# Patient Record
Sex: Female | Born: 1935 | Race: Black or African American | Hispanic: No | Smoking: Former smoker
Health system: Southern US, Community
[De-identification: ages and names within clinical notes are randomized; demographics above are authoritative.]

## PROBLEM LIST (undated history)

## (undated) DIAGNOSIS — F1011 Alcohol abuse, in remission: Secondary | ICD-10-CM

## (undated) DIAGNOSIS — I4891 Unspecified atrial fibrillation: Secondary | ICD-10-CM

## (undated) DIAGNOSIS — C3491 Malignant neoplasm of unspecified part of right bronchus or lung: Secondary | ICD-10-CM

## (undated) DIAGNOSIS — I10 Essential (primary) hypertension: Secondary | ICD-10-CM

## (undated) DIAGNOSIS — Z8709 Personal history of other diseases of the respiratory system: Secondary | ICD-10-CM

## (undated) DIAGNOSIS — F32A Depression, unspecified: Secondary | ICD-10-CM

## (undated) DIAGNOSIS — R0600 Dyspnea, unspecified: Secondary | ICD-10-CM

## (undated) DIAGNOSIS — R918 Other nonspecific abnormal finding of lung field: Secondary | ICD-10-CM

## (undated) DIAGNOSIS — I499 Cardiac arrhythmia, unspecified: Secondary | ICD-10-CM

## (undated) DIAGNOSIS — F329 Major depressive disorder, single episode, unspecified: Secondary | ICD-10-CM

## (undated) DIAGNOSIS — M8580 Other specified disorders of bone density and structure, unspecified site: Secondary | ICD-10-CM

## (undated) DIAGNOSIS — F419 Anxiety disorder, unspecified: Secondary | ICD-10-CM

## (undated) HISTORY — DX: Other specified disorders of bone density and structure, unspecified site: M85.80

## (undated) HISTORY — DX: Major depressive disorder, single episode, unspecified: F32.9

## (undated) HISTORY — DX: Essential (primary) hypertension: I10

## (undated) HISTORY — DX: Alcohol abuse, in remission: F10.11

## (undated) HISTORY — DX: Unspecified atrial fibrillation: I48.91

## (undated) HISTORY — DX: Anxiety disorder, unspecified: F41.9

## (undated) HISTORY — DX: Depression, unspecified: F32.A

## (undated) HISTORY — DX: Other nonspecific abnormal finding of lung field: R91.8

## (undated) HISTORY — DX: Malignant neoplasm of unspecified part of right bronchus or lung: C34.91

---

## 2003-02-18 ENCOUNTER — Inpatient Hospital Stay (HOSPITAL_COMMUNITY): Admission: EM | Admit: 2003-02-18 | Discharge: 2003-02-21 | Payer: Self-pay | Admitting: Psychiatry

## 2004-01-23 ENCOUNTER — Ambulatory Visit: Payer: Self-pay | Admitting: Psychiatry

## 2004-02-12 ENCOUNTER — Ambulatory Visit: Payer: Self-pay | Admitting: Psychiatry

## 2004-08-25 ENCOUNTER — Ambulatory Visit: Payer: Self-pay | Admitting: Psychiatry

## 2004-08-25 ENCOUNTER — Inpatient Hospital Stay (HOSPITAL_COMMUNITY): Admission: RE | Admit: 2004-08-25 | Discharge: 2004-08-28 | Payer: Self-pay | Admitting: Psychiatry

## 2004-09-16 ENCOUNTER — Ambulatory Visit: Payer: Self-pay | Admitting: Psychiatry

## 2004-10-28 ENCOUNTER — Ambulatory Visit: Payer: Self-pay | Admitting: Psychiatry

## 2004-12-03 ENCOUNTER — Ambulatory Visit: Payer: Self-pay | Admitting: Psychiatry

## 2005-01-12 ENCOUNTER — Ambulatory Visit: Payer: Self-pay | Admitting: Psychiatry

## 2005-03-12 ENCOUNTER — Ambulatory Visit: Payer: Self-pay | Admitting: Psychiatry

## 2005-05-07 ENCOUNTER — Ambulatory Visit: Payer: Self-pay | Admitting: Psychiatry

## 2005-06-15 ENCOUNTER — Ambulatory Visit: Payer: Self-pay | Admitting: Psychiatry

## 2005-08-20 ENCOUNTER — Ambulatory Visit (HOSPITAL_COMMUNITY): Payer: Self-pay | Admitting: Psychiatry

## 2005-09-09 ENCOUNTER — Ambulatory Visit (HOSPITAL_COMMUNITY): Payer: Self-pay | Admitting: Psychiatry

## 2005-09-13 ENCOUNTER — Ambulatory Visit (HOSPITAL_COMMUNITY): Payer: Self-pay | Admitting: Psychiatry

## 2005-10-15 ENCOUNTER — Ambulatory Visit (HOSPITAL_COMMUNITY): Payer: Self-pay | Admitting: Psychiatry

## 2005-11-16 ENCOUNTER — Ambulatory Visit (HOSPITAL_COMMUNITY): Payer: Self-pay | Admitting: Psychiatry

## 2005-11-18 ENCOUNTER — Ambulatory Visit (HOSPITAL_COMMUNITY): Payer: Self-pay | Admitting: Psychiatry

## 2005-12-15 ENCOUNTER — Ambulatory Visit (HOSPITAL_COMMUNITY): Payer: Self-pay | Admitting: Psychiatry

## 2006-01-21 ENCOUNTER — Ambulatory Visit (HOSPITAL_COMMUNITY): Payer: Self-pay | Admitting: Psychiatry

## 2006-02-03 ENCOUNTER — Ambulatory Visit (HOSPITAL_COMMUNITY): Payer: Self-pay | Admitting: Psychiatry

## 2006-03-18 ENCOUNTER — Ambulatory Visit (HOSPITAL_COMMUNITY): Payer: Self-pay | Admitting: Psychiatry

## 2006-04-07 ENCOUNTER — Ambulatory Visit (HOSPITAL_COMMUNITY): Payer: Self-pay | Admitting: Psychiatry

## 2006-05-31 ENCOUNTER — Ambulatory Visit (HOSPITAL_COMMUNITY): Payer: Self-pay | Admitting: Psychiatry

## 2006-06-30 ENCOUNTER — Ambulatory Visit (HOSPITAL_COMMUNITY): Payer: Self-pay | Admitting: Psychiatry

## 2006-07-07 ENCOUNTER — Ambulatory Visit (HOSPITAL_COMMUNITY): Payer: Self-pay | Admitting: Psychiatry

## 2006-08-09 ENCOUNTER — Ambulatory Visit (HOSPITAL_COMMUNITY): Payer: Self-pay | Admitting: Psychiatry

## 2006-08-18 ENCOUNTER — Ambulatory Visit (HOSPITAL_COMMUNITY): Payer: Self-pay | Admitting: Psychiatry

## 2006-10-04 ENCOUNTER — Ambulatory Visit (HOSPITAL_COMMUNITY): Payer: Self-pay | Admitting: Psychiatry

## 2006-10-18 ENCOUNTER — Ambulatory Visit (HOSPITAL_COMMUNITY): Payer: Self-pay | Admitting: Psychiatry

## 2006-12-16 ENCOUNTER — Ambulatory Visit (HOSPITAL_COMMUNITY): Payer: Self-pay | Admitting: Psychiatry

## 2007-01-17 ENCOUNTER — Ambulatory Visit (HOSPITAL_COMMUNITY): Payer: Self-pay | Admitting: Psychiatry

## 2007-02-21 ENCOUNTER — Ambulatory Visit (HOSPITAL_COMMUNITY): Payer: Self-pay | Admitting: Psychiatry

## 2007-03-21 ENCOUNTER — Ambulatory Visit (HOSPITAL_COMMUNITY): Payer: Self-pay | Admitting: Psychiatry

## 2007-05-16 ENCOUNTER — Ambulatory Visit (HOSPITAL_COMMUNITY): Payer: Self-pay | Admitting: Psychiatry

## 2007-06-08 ENCOUNTER — Ambulatory Visit (HOSPITAL_COMMUNITY): Payer: Self-pay | Admitting: Psychiatry

## 2007-08-09 ENCOUNTER — Ambulatory Visit (HOSPITAL_COMMUNITY): Payer: Self-pay | Admitting: Psychiatry

## 2007-08-10 ENCOUNTER — Ambulatory Visit (HOSPITAL_COMMUNITY): Payer: Self-pay | Admitting: Psychiatry

## 2007-09-08 ENCOUNTER — Ambulatory Visit (HOSPITAL_COMMUNITY): Payer: Self-pay | Admitting: Psychiatry

## 2007-10-13 ENCOUNTER — Ambulatory Visit (HOSPITAL_COMMUNITY): Payer: Self-pay | Admitting: Psychiatry

## 2007-11-23 ENCOUNTER — Ambulatory Visit (HOSPITAL_COMMUNITY): Payer: Self-pay | Admitting: Psychiatry

## 2008-01-01 ENCOUNTER — Ambulatory Visit (HOSPITAL_COMMUNITY): Payer: Self-pay | Admitting: Psychiatry

## 2008-02-22 ENCOUNTER — Ambulatory Visit (HOSPITAL_COMMUNITY): Payer: Self-pay | Admitting: Psychiatry

## 2008-05-23 ENCOUNTER — Ambulatory Visit (HOSPITAL_COMMUNITY): Payer: Self-pay | Admitting: Psychiatry

## 2008-07-10 ENCOUNTER — Ambulatory Visit (HOSPITAL_COMMUNITY): Payer: Self-pay | Admitting: Psychiatry

## 2008-08-19 ENCOUNTER — Ambulatory Visit (HOSPITAL_COMMUNITY): Payer: Self-pay | Admitting: Psychiatry

## 2008-08-20 ENCOUNTER — Ambulatory Visit (HOSPITAL_COMMUNITY): Payer: Self-pay | Admitting: Psychiatry

## 2008-11-19 ENCOUNTER — Ambulatory Visit (HOSPITAL_COMMUNITY): Payer: Self-pay | Admitting: Psychiatry

## 2009-01-10 ENCOUNTER — Ambulatory Visit (HOSPITAL_COMMUNITY): Payer: Self-pay | Admitting: Psychiatry

## 2009-03-20 ENCOUNTER — Ambulatory Visit (HOSPITAL_COMMUNITY): Payer: Self-pay | Admitting: Psychiatry

## 2009-06-19 ENCOUNTER — Ambulatory Visit (HOSPITAL_COMMUNITY): Payer: Self-pay | Admitting: Psychiatry

## 2009-06-26 ENCOUNTER — Ambulatory Visit (HOSPITAL_COMMUNITY): Payer: Self-pay | Admitting: Psychiatry

## 2009-07-11 ENCOUNTER — Ambulatory Visit (HOSPITAL_COMMUNITY): Payer: Self-pay | Admitting: Psychiatry

## 2009-07-15 ENCOUNTER — Ambulatory Visit (HOSPITAL_COMMUNITY): Payer: Self-pay | Admitting: Psychiatry

## 2009-08-05 ENCOUNTER — Ambulatory Visit (HOSPITAL_COMMUNITY): Payer: Self-pay | Admitting: Psychiatry

## 2009-08-14 ENCOUNTER — Ambulatory Visit (HOSPITAL_COMMUNITY): Payer: Self-pay | Admitting: Psychiatry

## 2009-08-26 ENCOUNTER — Ambulatory Visit (HOSPITAL_COMMUNITY): Payer: Self-pay | Admitting: Psychiatry

## 2009-09-24 ENCOUNTER — Ambulatory Visit (HOSPITAL_COMMUNITY): Payer: Self-pay | Admitting: Psychiatry

## 2009-09-30 ENCOUNTER — Ambulatory Visit (HOSPITAL_COMMUNITY): Payer: Self-pay | Admitting: Psychiatry

## 2009-10-29 ENCOUNTER — Ambulatory Visit (HOSPITAL_COMMUNITY): Payer: Self-pay | Admitting: Psychiatry

## 2009-11-28 ENCOUNTER — Ambulatory Visit (HOSPITAL_COMMUNITY): Payer: Self-pay | Admitting: Psychiatry

## 2009-12-30 ENCOUNTER — Ambulatory Visit (HOSPITAL_COMMUNITY): Payer: Self-pay | Admitting: Psychiatry

## 2010-01-20 ENCOUNTER — Ambulatory Visit (HOSPITAL_COMMUNITY): Payer: Self-pay | Admitting: Psychiatry

## 2010-03-05 ENCOUNTER — Ambulatory Visit (HOSPITAL_COMMUNITY): Payer: Self-pay | Admitting: Psychiatry

## 2010-03-31 ENCOUNTER — Ambulatory Visit (HOSPITAL_COMMUNITY): Payer: Self-pay | Admitting: Psychiatry

## 2010-05-14 ENCOUNTER — Ambulatory Visit (HOSPITAL_COMMUNITY)
Admission: RE | Admit: 2010-05-14 | Discharge: 2010-05-14 | Payer: Self-pay | Source: Home / Self Care | Attending: Psychiatry | Admitting: Psychiatry

## 2010-06-18 ENCOUNTER — Encounter (HOSPITAL_COMMUNITY): Payer: Self-pay | Admitting: Psychiatry

## 2010-06-30 ENCOUNTER — Encounter (INDEPENDENT_AMBULATORY_CARE_PROVIDER_SITE_OTHER): Payer: Medicare Other | Admitting: Psychiatry

## 2010-06-30 DIAGNOSIS — F331 Major depressive disorder, recurrent, moderate: Secondary | ICD-10-CM

## 2010-07-22 ENCOUNTER — Encounter (INDEPENDENT_AMBULATORY_CARE_PROVIDER_SITE_OTHER): Payer: Medicare Other | Admitting: Psychiatry

## 2010-07-22 DIAGNOSIS — F411 Generalized anxiety disorder: Secondary | ICD-10-CM

## 2010-07-22 DIAGNOSIS — F331 Major depressive disorder, recurrent, moderate: Secondary | ICD-10-CM

## 2010-08-28 ENCOUNTER — Encounter (HOSPITAL_COMMUNITY): Payer: Medicare Other | Admitting: Psychiatry

## 2010-09-18 NOTE — Discharge Summary (Signed)
Alexandria Chen, Alexandria Chen                  ACCOUNT NO.:  [id number]   MEDICAL RECORD NO.:  [id number]          PATIENT TYPE:  IPS   LOCATION:  0502                          FACILITY:  [facility]   PHYSICIAN:  Geoffery Lyons, M.D.      DATE OF BIRTH:  12/27/1934   DATE OF ADMISSION:  08/25/2004  DATE OF DISCHARGE:  08/28/2004                                 DISCHARGE SUMMARY   CHIEF COMPLAINT AND PRESENT ILLNESS:  This was the second admission to [facility] for this 76 year old African-American female,  married, voluntarily admitted.  History of depression with alcohol abuse.  Relapsed on alcohol about six months prior to this admission.  She endorsed  when she started drinking she could not stop.  Drinking up to 4-6 tall beers  daily.  Had been feeling anxious and irritable when relapsed.  Grieving the  loss of the sister two weeks prior to this admission.  Endorsed sadness,  irritability, some anxiety, passive suicidal ideation.   PAST PSYCHIATRIC HISTORY:  Second time at [facility].  Last time in  October of 2004 for anxiety, depression and alcohol abuse.   ALCOHOL/DRUG HISTORY:  Alcohol for the last 20 years with some sobriety in  between.   MEDICAL HISTORY:  Arterial hypertension.   MEDICATIONS:  Norvasc 10 mg daily, Prozac 20 mg daily, folic acid 1 mg  daily.   PHYSICAL EXAMINATION:  Performed and failed to show any acute findings.   LABORATORY DATA:  Blood chemistries within normal limits.  SGOT 23, SGPT 19.  TSH 1.918.  Drug screen negative for substances of abuse.   MENTAL STATUS EXAM:  Alert, pleasant, cooperative female, spontaneous.  Speech normal rate, tempo and production.  Mood euthymic.  Affect broad.  Thought process logical, coherent and relevant.  Some passive suicidal  ideation.  No plan.  No homicidal ideation.  No delusions.  No  hallucinations.  Cognition was well-preserved.   ADMISSION DIAGNOSES:   AXIS I:  1.  Alcohol dependence.  2.   Depressive disorder not otherwise specified.   AXIS II:  No diagnosis.   AXIS III:  Arterial hypertension.   AXIS IV:  Moderate.   AXIS V:  Global Assessment of Functioning upon admission 35; highest Global  Assessment of Functioning in the last year 68.   HOSPITAL COURSE:  She was admitted and she was started in individual and  group psychotherapy.  She was given trazodone for sleep.  She was maintained  on Norvasc 10 mg per day and Prozac 20 mg per day.  She was detoxified with  Librium.  She endorsed that she has been having a hard time with alcohol but  actually minimizes her use.  Endorsed that she got depressed, she started  drinking and then cannot control it.  Her mood was anxious.  Affect was  broad.  She was still minimizing her alcohol use.  Claimed that she was  feeling better.  We pursued the detox.  There was a family session with the  husband and three daughters.  Concerns about her drinking.  Concerns about  drinking and driving.  Family attributed the relapse to the loss of the  sister.  Still minimizing her alcohol use.  She endorsed that she was ready  to go home.  She was wanting to be discharged after the session.  When she  was told that she was not going to be discharged, she became very upset and  tearful.  She was supported by the family.  On April 20th, she was in full  contact with reality.  No suicidal ideation.  No hallucinations.  No  homicidal ideation.  No evidence of active withdrawal.  Endorsed she was  feeling much better.  Committed to abstinence but at the same time still  minimizing.  Not a complete insight in terms of the severity of her alcohol  use but claiming that she was going to abstain.   DISCHARGE DIAGNOSES:   AXIS I:  1.  Alcohol abuse.  2.  Depressive disorder not otherwise specified.   AXIS II:  No diagnosis.   AXIS III:  Arterial hypertension.   AXIS IV:  Moderate.   AXIS V:  Global Assessment of Functioning upon discharge  50.   DISCHARGE MEDICATIONS:  1.  Norvasc 10 mg per day.  2.  Prozac 20 mg per day.  3.  Trazodone 50 mg at night.   FOLLOW UP:  Dr. Lolly Mustache and Arnold Long.       IL/MEDQ  D:  09/23/2004  T:  09/23/2004  Job:  [id number]

## 2010-09-18 NOTE — Discharge Summary (Signed)
NAME:  Alexandria Chen, Alexandria Chen                            ACCOUNT NO.:  [id number]   MEDICAL RECORD NO.:  [id number]                   PATIENT TYPE:  IPS   LOCATION:  0501                                 FACILITY:  [facility]   PHYSICIAN:  Geoffery Lyons, M.D.                   DATE OF BIRTH:  1934-10-09   DATE OF ADMISSION:  02/18/2003  DATE OF DISCHARGE:  02/21/2003                                 DISCHARGE SUMMARY   CHIEF COMPLAINT AND PRESENT ILLNESS:  This was the first admission to [facility] for this 76 year old African-American female,  married, voluntarily admitted.  Presented with a history of alcohol use, at  least for 20 years.  Never detoxed.  Previous sobriety not clear.  Drinking,  increased use of alcohol for the last two months.  No known stressors.  Married but gets very depressed.  Denies suicidal or homicidal ideation.  Drinking at least 3-4 beers per day.  Some sweating and anxiety as  withdrawal symptoms.   PAST PSYCHIATRIC HISTORY:  Behavioral Health in [place] for a month.  Has  been on Prozac for a month.  First time treated.   ALCOHOL/DRUG HISTORY:  Alcohol as already stated.  No significant complete  abstinence.   MEDICAL HISTORY:  Dyslipidemia, arterial hypertension.   MEDICATIONS:  BuSpar 15 mg twice a day, Prozac 20 mg daily, Lipitor 10 mg  daily, Norvasc 5 mg daily, Ativan 0.25 mg twice a day and Toprol XL 50 mg  daily.   PHYSICAL EXAMINATION:  Performed and failed to show any acute findings.   MENTAL STATUS EXAM:  Fully alert, in no acute distress, pleasant, sociable.  Speech normal tempo, production, rate.  Some constriction in her affect.  Mood euthymic.  Thought process logical and coherent.  No homicidal  ideation.  No suicidal ideation.  No delusions.  No hallucinations.  Cognition well-preserved.  Some insight in terms of her need to abstain.   LABORATORY DATA:  Drug screen negative for substances of abuse.  CBC within  normal limits.   Blood chemistries with potassium 3.8.   ADMISSION DIAGNOSES:   AXIS I:  1. Alcohol dependence.  2. Rule out depressive disorder not otherwise specified.   AXIS II:  No diagnosis.   AXIS III:  1. Arterial hypertension.  2. Dyslipidemia.   AXIS IV:  Moderate.   AXIS V:  Global Assessment of Functioning upon admission 35; highest Global  Assessment of Functioning in the last year 65.   HOSPITAL COURSE:  She was admitted and started intensive individual and  group psychotherapy.  She was detoxified with Librium.  She was maintained  on her Prozac 20 mg per day, trazodone 25-50 mg for sleep, Toprol 50 mg  daily, Norvasc 5 mg daily.  She was given Lipitor 10 mg per day.  As she  settled down, she was more aware of what  is happening.  Issues with anxiety.  Committed to abstinence.  Still minimizing the use.  Started working on a  relapse prevention plan.  She was motivated to pursue long-term abstinence  and go to further treatment and go to a 12-step meeting.  Family session  with the husband.  He was supportive.  They felt she was baseline.  There  was no evidence of withdrawal.  No evidence of acute symptomatology.  We  went ahead and discharged to outpatient follow-up.   DISCHARGE DIAGNOSES:   AXIS I:  1. Alcohol dependence.  2. Depressive disorder not otherwise specified.  3. Anxiety disorder not otherwise specified.   AXIS II:  No diagnosis.   AXIS III:  1. Arterial hypertension.  2. Dyslipidemia.   AXIS IV:  Moderate.   AXIS V:  Global Assessment of Functioning upon discharge 55.   DISCHARGE MEDICATIONS:  1. Librium 25 mg, 1 in the morning x 1; then discontinue to complete the     detox.  2. Prozac 20 mg per day.  3. Toprol XL 50 mg per day.  4. Norvasc 5 mg per day.  5. Lipitor 10 mg daily.  6. Trazodone 50 mg as needed for sleep.   FOLLOW UP:  [facility].                                               Geoffery Lyons,  M.D.    IL/MEDQ  D:  03/20/2003  T:  03/21/2003  Job:  [id number]

## 2010-09-24 ENCOUNTER — Encounter (INDEPENDENT_AMBULATORY_CARE_PROVIDER_SITE_OTHER): Payer: Medicare Other | Admitting: Psychiatry

## 2010-09-24 DIAGNOSIS — F331 Major depressive disorder, recurrent, moderate: Secondary | ICD-10-CM

## 2010-10-30 ENCOUNTER — Encounter (HOSPITAL_COMMUNITY): Payer: Medicare Other | Admitting: Psychiatry

## 2010-12-17 ENCOUNTER — Encounter (INDEPENDENT_AMBULATORY_CARE_PROVIDER_SITE_OTHER): Payer: Medicare Other | Admitting: Psychiatry

## 2010-12-17 DIAGNOSIS — F331 Major depressive disorder, recurrent, moderate: Secondary | ICD-10-CM

## 2010-12-31 ENCOUNTER — Encounter (INDEPENDENT_AMBULATORY_CARE_PROVIDER_SITE_OTHER): Payer: Medicare Other | Admitting: Psychiatry

## 2010-12-31 DIAGNOSIS — F411 Generalized anxiety disorder: Secondary | ICD-10-CM

## 2010-12-31 DIAGNOSIS — F331 Major depressive disorder, recurrent, moderate: Secondary | ICD-10-CM

## 2011-01-29 ENCOUNTER — Encounter (HOSPITAL_COMMUNITY): Payer: Medicare Other | Admitting: Psychiatry

## 2011-02-18 ENCOUNTER — Encounter (INDEPENDENT_AMBULATORY_CARE_PROVIDER_SITE_OTHER): Payer: Medicare Other | Admitting: Psychiatry

## 2011-02-18 DIAGNOSIS — F331 Major depressive disorder, recurrent, moderate: Secondary | ICD-10-CM

## 2011-02-18 DIAGNOSIS — F411 Generalized anxiety disorder: Secondary | ICD-10-CM

## 2011-03-10 ENCOUNTER — Encounter (HOSPITAL_COMMUNITY): Payer: Self-pay | Admitting: Psychiatry

## 2011-03-11 ENCOUNTER — Encounter (HOSPITAL_COMMUNITY): Payer: Self-pay | Admitting: Psychiatry

## 2011-03-11 ENCOUNTER — Encounter (HOSPITAL_COMMUNITY): Payer: Medicare Other | Admitting: Psychiatry

## 2011-03-11 ENCOUNTER — Ambulatory Visit (INDEPENDENT_AMBULATORY_CARE_PROVIDER_SITE_OTHER): Payer: Medicare Other | Admitting: Psychiatry

## 2011-03-11 VITALS — Wt 149.2 lb

## 2011-03-11 DIAGNOSIS — F331 Major depressive disorder, recurrent, moderate: Secondary | ICD-10-CM

## 2011-03-11 MED ORDER — FLUOXETINE HCL 40 MG PO CAPS: 40.0000 mg | ORAL_CAPSULE | Freq: Every day | ORAL | Status: DC

## 2011-03-11 MED ORDER — BUSPIRONE HCL 5 MG PO TABS: 5.0000 mg | ORAL_TABLET | Freq: Three times a day (TID) | ORAL | Status: DC

## 2011-03-11 NOTE — Progress Notes (Signed)
Pt came today for a followup appointment. She's been doing very well on her current medication. She is less anxious and less depressed and her sleep has been much better. She has seen her primary care doctor Darl Pikes weeks and there are no changes in her medication. I reviewed all her medication. She reported no side effects of medication. She has been not drinking ETOH however she continued to smoke but not ready to quit at this time. Mental status examination. Patient is calm cooperative pleasant and maintained good eye contact. Her speech is clear and coherent. Her thought process logical linear and goal-directed. She described her mood is anxious and her affect was mood congruent. There no psychotic symptoms present. She's alert and oriented x3 and. Her insight judgment impulse control is okay. She denies any active or passive suicidal thinking or any auditory or visual hallucination. Assessment. Depressive disorder Plan we will continue her Prozac and BuSpar. I have explained the risks and benefits of medication. I will see her again in 3 months.

## 2011-03-19 ENCOUNTER — Encounter (HOSPITAL_COMMUNITY): Payer: Medicare Other | Admitting: Psychiatry

## 2011-04-10 ENCOUNTER — Other Ambulatory Visit (HOSPITAL_COMMUNITY): Payer: Self-pay

## 2011-06-03 ENCOUNTER — Encounter (HOSPITAL_COMMUNITY): Payer: Self-pay | Admitting: Psychiatry

## 2011-06-03 ENCOUNTER — Ambulatory Visit (INDEPENDENT_AMBULATORY_CARE_PROVIDER_SITE_OTHER): Payer: Medicare Other | Admitting: Psychiatry

## 2011-06-03 DIAGNOSIS — F331 Major depressive disorder, recurrent, moderate: Secondary | ICD-10-CM

## 2011-06-03 DIAGNOSIS — F329 Major depressive disorder, single episode, unspecified: Secondary | ICD-10-CM

## 2011-06-03 DIAGNOSIS — H2510 Age-related nuclear cataract, unspecified eye: Secondary | ICD-10-CM | POA: Diagnosis not present

## 2011-06-03 MED ORDER — FLUOXETINE HCL 40 MG PO CAPS: 40.0000 mg | ORAL_CAPSULE | Freq: Every day | ORAL | Status: DC

## 2011-06-03 MED ORDER — BUSPIRONE HCL 5 MG PO TABS: 5.0000 mg | ORAL_TABLET | Freq: Three times a day (TID) | ORAL | Status: DC

## 2011-06-03 NOTE — Progress Notes (Signed)
Pt came today for a followup appointment. She is been compliant with her medication and reported no side effects. She is very quiet Christmas. Sometimes she feels isolated and withdrawn but overall her depression is well controlled. She is sleeping fairly well. She is scheduled to see her primary care physician in February to have a blood work. She denies any crying spells agitation anger or mood swings.  She has been not drinking ETOH however she continued to smoke but not ready to quit at this time.  Mental status examination.  Patient is calm cooperative pleasant and maintained good eye contact. Her speech is slow and soft but clear and coherent. Her attention and concentration is fair.  Her thought process logical linear and goal-directed. She described her mood is anxious and her affect was mood congruent. There no psychotic symptoms present. She's alert and oriented x3 and. Her insight judgment impulse control is okay. She denies any active or passive suicidal thinking or any auditory or visual hallucination.  Assessment.  Depressive disorder Plan we will continue her Prozac and BuSpar. I have explained the risks and benefits of medication. I recommended to have her blood results sent to [place] from her primary care physician. I will see her again in 3 months.

## 2011-06-16 ENCOUNTER — Ambulatory Visit (INDEPENDENT_AMBULATORY_CARE_PROVIDER_SITE_OTHER): Payer: Medicare Other | Admitting: Psychiatry

## 2011-06-16 ENCOUNTER — Encounter (HOSPITAL_COMMUNITY): Payer: Self-pay | Admitting: Psychiatry

## 2011-06-16 DIAGNOSIS — F411 Generalized anxiety disorder: Secondary | ICD-10-CM | POA: Diagnosis not present

## 2011-06-16 DIAGNOSIS — F331 Major depressive disorder, recurrent, moderate: Secondary | ICD-10-CM | POA: Diagnosis not present

## 2011-06-16 NOTE — Patient Instructions (Signed)
Discussed orally 

## 2011-06-16 NOTE — Progress Notes (Signed)
Patient:  Alexandria Chen   DOB: 1934/12/27  MR Number: [id number]  Location: [facility]:  [address] [place] [place],  [place], [address]  Start: Wednesday 06/16/2011 9:35 AM End: Wednesday 06/16/2011 10:30 AM  Provider/Observer:     Florencia Reasons, MSW, LCSW   Chief Complaint:      Chief Complaint  Patient presents with  . Depression  . Anxiety    Reason For Service:     The patient is a 77 year old African American female who has a long-standing history of recurrent periods of symptoms of depression and anxiety. Currently she is experiencing anxiety along with excessive worry.  Interventions Strategy:  Supportive therapy, cognitive behavioral therapy  Participation Level:   Active  Participation Quality:  Appropriate      Behavioral Observation:  Casual, Alert, and Appropriate.   Current Psychosocial Factors: The patient is a 17 year old mother has health issues. The patient continues to have  grief and loss issues regarding her deceased husband. Patient also reports financial stress as she now is having to pay  for some of her medications she received at no-cost in the past.  Content of Session:   Reviewing symptoms, identifying stressors, processing feelings, identifying ways to reframe negative thought patterns, identifying ways to set  and maintain boundaries in her relationship with her mother, practicing relaxation technique (diaphragmatic breathing)  Current Status:   The patient reports decreased depressed mood, improved sleep pattern, but continued anxiety especially in the mornings and excessive worry.  Patient Progress:   Good. The patient reports being less depressed but experiencing sadness regarding her deceased husband. She reports increased thoughts about him in the past several weeks. She states visiting his grave 3-4 times a month.  Patient has been more engaged in activities at her home including cooking, doing household chores, and doing puzzles. She also  states more comfortable at home as long as she is alone. However, she reports still becoming anxious or upset very easily if something happens or she receives a phone call. She also reports continued stress regarding her relationship with her mother but has begun to become more assertive in the relationship. She has begun to tell her mother no regarding some requests and has been able to discontinue a conversation if she finds herself becoming too upset. Patient also has begun to use her support system and will talk to her daughter when she becomes frustrated with her mother.  Target Goals:   Improve mood, decrease anxiety and excessive worry, and resume normal interest in activities  Last Reviewed:     Goals Addressed Today:    Decrease anxiety and excessive worry  Impression/Diagnosis:   The patient has a long-standing history of recurrent periods of depression and anxiety.  Her symptoms have included depressed mood, social withdrawal, lack of interest in activities, poor motivation, excessive  worry, feeling jittery, and ruminating thoughts diagnosis: Maj. depressive disorder, recurrent, moderate; generalized anxiety disorder  Diagnosis:  Axis I:  1. Major depressive disorder, recurrent, moderate   2. Generalized anxiety disorder             Axis II: No diagnosis

## 2011-06-30 DIAGNOSIS — E782 Mixed hyperlipidemia: Secondary | ICD-10-CM | POA: Diagnosis not present

## 2011-06-30 DIAGNOSIS — I10 Essential (primary) hypertension: Secondary | ICD-10-CM | POA: Diagnosis not present

## 2011-06-30 DIAGNOSIS — E559 Vitamin D deficiency, unspecified: Secondary | ICD-10-CM | POA: Diagnosis not present

## 2011-06-30 DIAGNOSIS — R0602 Shortness of breath: Secondary | ICD-10-CM | POA: Diagnosis not present

## 2011-06-30 DIAGNOSIS — E785 Hyperlipidemia, unspecified: Secondary | ICD-10-CM | POA: Diagnosis not present

## 2011-07-01 ENCOUNTER — Other Ambulatory Visit (HOSPITAL_COMMUNITY): Payer: Self-pay | Admitting: Psychiatry

## 2011-07-01 NOTE — Telephone Encounter (Signed)
New prescription given in Jan

## 2011-07-20 ENCOUNTER — Other Ambulatory Visit (HOSPITAL_COMMUNITY): Payer: Self-pay | Admitting: Psychiatry

## 2011-07-20 NOTE — Telephone Encounter (Signed)
Given script on 06/03/11 with two refills.

## 2011-07-28 ENCOUNTER — Ambulatory Visit (HOSPITAL_COMMUNITY): Payer: Medicare Other | Admitting: Psychiatry

## 2011-08-27 ENCOUNTER — Ambulatory Visit (INDEPENDENT_AMBULATORY_CARE_PROVIDER_SITE_OTHER): Payer: Medicare Other | Admitting: Psychiatry

## 2011-08-27 DIAGNOSIS — F331 Major depressive disorder, recurrent, moderate: Secondary | ICD-10-CM

## 2011-08-27 DIAGNOSIS — F411 Generalized anxiety disorder: Secondary | ICD-10-CM | POA: Diagnosis not present

## 2011-08-27 NOTE — Patient Instructions (Signed)
Improve self-care --use relaxation breathing daily, listen to music, increase physical activity (walking, gardening), increase social contact and use support group (family and friends)

## 2011-08-27 NOTE — Progress Notes (Signed)
Patient:  Alexandria Chen   DOB: [birth year]  MR Number: [id number]  Location: [facility] Center:  [address] [place],  [place], [address]  Start: Friday 08/27/2011 10:05 AM End: Friday 08/27/2011 10:55 AM  Provider/Observer:     Florencia Reasons, MSW, LCSW   Chief Complaint:      Chief Complaint  Patient presents with  . Depression  . Anxiety    Reason For Service:     The patient is a 77 year old African American female who has a long-standing history of recurrent periods of symptoms of depression and anxiety. Currently she is experiencing anxiety along with excessive worry. Patient is seen today for a follow up appointment.  Interventions Strategy:  Supportive therapy, cognitive behavioral therapy  Participation Level:   Active  Participation Quality:  Appropriate      Behavioral Observation:  Casual, Alert, and fidgety, anxious  Current Psychosocial Factors: The patient's 7 year old mother has health issues and is experiencing increased confusion and memory difficulty.  Content of Session:   Reviewing symptoms, identifying stressors, processing feelings, identifying ways to reframe negative thought patterns, identifying relaxation and coping techniques along with ways to increase social involvement  Current Status:   The patient reports increased anxiety especially in the mornings and excessive worry.  Patient Progress:   Fair. The patient reports increased anxiety and increased worry along with poor motivation to participate in activities. She reports increased stress related to her mother experiencing increased confusion and memory difficulty. She was upset by conversation she had with her mother this morning as she told patient that she sometimes feels like giving up. The patient has fear of her mother possibly having Alzheimer's and dying. Patient continues to have strong support from her family. Therapist works with the patient to review relaxation and coping techniques  along with identifying ways to increase involvement in activity. Patient is scheduled to see Dr. Lolly Mustache for medication evaluation on 08/31/2011.  Target Goals:   Improve mood, decrease anxiety and excessive worry, and resume normal interest in activities  Last Reviewed:     Goals Addressed Today:    Decrease anxiety and excessive worry  Impression/Diagnosis:   The patient has a long-standing history of recurrent periods of depression and anxiety.  Her symptoms have included depressed mood, social withdrawal, lack of interest in activities, poor motivation, excessive  worry, feeling jittery, and ruminating thoughts diagnosis: Maj. depressive disorder, recurrent, moderate; generalized anxiety disorder  Diagnosis:  Axis I:  1. Major depressive disorder, recurrent, moderate   2. GAD (generalized anxiety disorder)             Axis II: No diagnosis

## 2011-08-31 ENCOUNTER — Encounter (HOSPITAL_COMMUNITY): Payer: Self-pay | Admitting: Psychiatry

## 2011-08-31 ENCOUNTER — Ambulatory Visit (INDEPENDENT_AMBULATORY_CARE_PROVIDER_SITE_OTHER): Payer: Medicare Other | Admitting: Psychiatry

## 2011-08-31 VITALS — BP 110/80 | HR 84 | Wt 144.0 lb

## 2011-08-31 DIAGNOSIS — F419 Anxiety disorder, unspecified: Secondary | ICD-10-CM

## 2011-08-31 DIAGNOSIS — F329 Major depressive disorder, single episode, unspecified: Secondary | ICD-10-CM

## 2011-08-31 DIAGNOSIS — F331 Major depressive disorder, recurrent, moderate: Secondary | ICD-10-CM

## 2011-08-31 DIAGNOSIS — F411 Generalized anxiety disorder: Secondary | ICD-10-CM

## 2011-08-31 MED ORDER — FLUOXETINE HCL 40 MG PO CAPS: 40.0000 mg | ORAL_CAPSULE | Freq: Every day | ORAL | Status: DC

## 2011-08-31 MED ORDER — BUSPIRONE HCL 10 MG PO TABS: 10.0000 mg | ORAL_TABLET | Freq: Three times a day (TID) | ORAL | Status: DC

## 2011-08-31 NOTE — Progress Notes (Signed)
Chief complaint Anxiety and depression  History of present illness Patient is 77 year old [place] American female who came for her followup appointment.  She complained of anxiety nervousness and sometimes unable to sleep.  Her mother has been sick who is 39 year old and required full-time assistance.  Patient endorse recently mother has been forgetting things and she's been acting out .  Her daughter stays with her in the night and during the day she has some help but patient also visit her frequently.  Patient admitted increased anxiety and nervousness in recent weeks.  She is very worried about her mother.  She admitted having panic attack and some crying spells but she denies any agitation anger or severe mood swings.  She denies any active or passive suicidal thoughts.  She likes her current medication and denies any side effects of medication.  She sleeps okay but she continues to have awakening for bathroom.  She denies any hallucination or paranoia.  She's not drinking or using any illegal substance.    Current psychiatric medication Prozac 40 mg daily BuSpar 5 mg 3 times a day   Past psychiatric history Patient endorsed significant history of depression .  She has at least 2 psychiatric admission at behavioral [facility] which are exacerbated by alcohol intake.  Her last psychiatric admission was in 2006 .  Patient had a good response with Prozac and BuSpar.  She was taking benzodiazepine in the past however due to history of alcohol it was discontinued.  Patient denies any history of paranoia or psychosis.  Medical history Hypertension.  Patient sees Dr. Paulene Floor in [place].  She is scheduled to have blood work in few weeks.  Psychosocial history Patient lives by herself however his grandson stays sometime with her.  Patient has 4 daughter and one son.  2 of her daughter lives close by.  Her mother also lives next to her home.    Alcohol and Substance use  history The patient has significant history of drinking alcohol for more than 30 years.  She has at least 2 psychiatric inpatient admission complicated with her alcohol intake.  Patient claims to be sober from many years.  Mental status examination.  Patient is anxious but pleasant and cooperative.  She is fairly groomed and well dressed.  Her speech is fast and rapid but coherent.  Her thought process is also fast but logical linear and goal-directed.  At time she has flight of ideas but there were no psychotic symptoms present.  Her attention and concentration is distracted at times.  She described her mood is anxious and her affect is mood congruent.  She denies any auditory or visual hallucination.  She denies any active or passive suicidal thoughts or homicidal thoughts.  There were no obsessions or delusions present at this time.  She's alert and oriented x3.  Her insight judgment and pulse control is okay.  Assessment.  Axis I Maj.  Depressive disorder , anxiety disorder NOS  Axis II deferred Axis III hypertension  Axis IV moderate Axis V 60-65  Plan [place] .  I do believe patient requires adjustment in her medication to target the anxiety and residual symptoms of depression.  She's taking BuSpar 5 mg 3 times a day .  I recommended to try 10 mg 3 times a day.  I also encouraged her to see her primary care physician for and we'll checkup.  I also reminded her that she should fax her  blood results to [place] .  She scheduled to have blood test next week.  I recommend to call us if she feels worsening of her symptoms or any time having suicidal thoughts or homicidal thoughts.  I will see her again in 4 weeks.  Time spent 30 minutes.

## 2011-09-29 ENCOUNTER — Encounter: Payer: Self-pay | Admitting: Internal Medicine

## 2011-09-29 DIAGNOSIS — R9431 Abnormal electrocardiogram [ECG] [EKG]: Secondary | ICD-10-CM | POA: Diagnosis not present

## 2011-09-29 DIAGNOSIS — R072 Precordial pain: Secondary | ICD-10-CM | POA: Diagnosis not present

## 2011-09-29 DIAGNOSIS — E785 Hyperlipidemia, unspecified: Secondary | ICD-10-CM | POA: Diagnosis not present

## 2011-09-29 DIAGNOSIS — I10 Essential (primary) hypertension: Secondary | ICD-10-CM | POA: Diagnosis not present

## 2011-09-30 ENCOUNTER — Ambulatory Visit (HOSPITAL_COMMUNITY): Payer: Self-pay | Admitting: Psychiatry

## 2011-10-04 ENCOUNTER — Ambulatory Visit (INDEPENDENT_AMBULATORY_CARE_PROVIDER_SITE_OTHER): Payer: Medicare Other | Admitting: Internal Medicine

## 2011-10-04 ENCOUNTER — Encounter: Payer: Self-pay | Admitting: Internal Medicine

## 2011-10-04 VITALS — BP 135/72 | HR 82 | Resp 18 | Ht 63.0 in | Wt 142.0 lb

## 2011-10-04 DIAGNOSIS — M79609 Pain in unspecified limb: Secondary | ICD-10-CM

## 2011-10-04 DIAGNOSIS — M79603 Pain in arm, unspecified: Secondary | ICD-10-CM

## 2011-10-04 MED ORDER — NITROGLYCERIN 0.4 MG SL SUBL: 0.4000 mg | SUBLINGUAL_TABLET | SUBLINGUAL | Status: DC | PRN

## 2011-10-04 NOTE — Progress Notes (Signed)
HPI Referred for EKG changes Patient a 77 year old with a history of HTN and dyslipidemia About 1 wk ago developed L arm, L shoulder pain.   Movement of L arm does not effect. No CP   No change in breathing.  Family notes that patient is depressed  Not doing much. No Known Allergies  Current Outpatient Prescriptions  Medication Sig Dispense Refill  . aspirin 81 MG tablet Take 81 mg by mouth daily.      Marland Kitchen atenolol (TENORMIN) 25 MG tablet       . atorvastatin (LIPITOR) 40 MG tablet Take 40 mg by mouth daily.      . busPIRone (BUSPAR) 10 MG tablet Take 1 tablet (10 mg total) by mouth 3 (three) times daily.  90 tablet  0  . Cholecalciferol (VITAMIN D3) 1000 UNITS CAPS Take 2,000 Units by mouth.      . felodipine (PLENDIL) 10 MG 24 hr tablet       . fish oil-omega-3 fatty acids 1000 MG capsule Take 2 g by mouth daily.      Marland Kitchen FLUoxetine (PROZAC) 40 MG capsule Take 1 capsule (40 mg total) by mouth daily.  30 capsule  0  . naproxen sodium (ANAPROX) 220 MG tablet Take 220 mg by mouth 2 (two) times daily with a meal.      . meclizine (ANTIVERT) 25 MG tablet Take 12.5 mg by mouth 2 (two) times daily between meals as needed.        Past Medical History  Diagnosis Date  . HTN (hypertension)   . Depression   . Anxiety     No past surgical history on file.  Family History  Problem Relation Age of Onset  . Anxiety disorder Mother     History   Social History  . Marital Status: Married    Spouse Name: N/A    Number of Children: N/A  . Years of Education: N/A   Occupational History  . Not on file.   Social History Main Topics  . Smoking status: Current Everyday Smoker -- 1.0 packs/day for 30 years    Types: Cigarettes  . Smokeless tobacco: Never Used  . Alcohol Use: No  . Drug Use: No  . Sexually Active: Not on file   Other Topics Concern  . Not on file   Social History Narrative  . No narrative on file    Review of Systems:  All systems reviewed.  They are negative to  the above problem except as previously stated.  Vital Signs: BP 135/72  Pulse 82  Resp 18  Ht 5\' 3"  (1.6 m)  Wt 142 lb (64.411 kg)  BMI 25.15 kg/m2  Physical Exam Patient is in NAD HEENT:  Normocephalic, atraumatic. EOMI, PERRLA.  Neck: JVP is normal. No thyromegaly. No bruits.  Lungs: clear to auscultation. No rales no wheezes.  Heart: Regular rate and rhythm. Normal S1, S2. No S3.   No significant murmurs. PMI not displaced.  Abdomen:  Supple, nontender. Normal bowel sounds. No masses. No hepatomegaly.  Extremities:   Good distal pulses throughout. No lower extremity edema.  Musculoskeletal :moving all extremities. Some decreased ROM of neck, unable to reproduce pain. Neuro:   alert and oriented x3.  CN II-XII grossly intact.  SR.  85.  Occasional PVC.  Nonspecific ST T wave changes  Assessment and Plan:  1.  EKG changes.  Patient's EKG from [facility] was double standard size.  I am not convinced of signif ST depression.  It is upsloping.  Today without signif depression. One PVC is present  Patient reports intermitt L arm pain that began recently.  About 1 month ago was doing some heavy lifting. I still am not convinced that this represents an anginal equiv. But, I would recomm that she continue current meds.  Given RX for NTG prn.  Will not sched testing now.  Daughters to call if worsens.  Should probably get neck eval for DJD  2.  HTN  Controlled on meds  3.  HL  Will get lipid panel from Dr. Kathi Der office   F/U PRN if symptoms continue, worsen

## 2011-10-04 NOTE — Patient Instructions (Signed)
Your physician recommends that you schedule a follow-up appointment in: AS NEEDED Your physician recommends that you continue on your current medications as directed. Please refer to the Current Medication list given to you today. NITROGLYCERIN   AS NEEDED FOR PAIN

## 2011-10-08 ENCOUNTER — Ambulatory Visit (HOSPITAL_COMMUNITY): Payer: Self-pay | Admitting: Psychiatry

## 2011-10-14 ENCOUNTER — Ambulatory Visit (INDEPENDENT_AMBULATORY_CARE_PROVIDER_SITE_OTHER): Payer: Medicare Other | Admitting: Psychiatry

## 2011-10-14 ENCOUNTER — Emergency Department (HOSPITAL_COMMUNITY)
Admission: EM | Admit: 2011-10-14 | Discharge: 2011-10-14 | Disposition: A | Discharge status: Rehabilitation Facility | Payer: Medicare Other | Source: Home / Self Care | Attending: Emergency Medicine | Admitting: Emergency Medicine

## 2011-10-14 ENCOUNTER — Encounter (HOSPITAL_COMMUNITY): Payer: Self-pay

## 2011-10-14 ENCOUNTER — Inpatient Hospital Stay (HOSPITAL_COMMUNITY)
Admission: AD | Admit: 2011-10-14 | Discharge: 2011-10-19 | DRG: 897 | Disposition: A | Discharge status: Home / Self Care | Payer: Medicare Other | Source: Ambulatory Visit | Attending: Psychiatry | Admitting: Psychiatry

## 2011-10-14 ENCOUNTER — Encounter (HOSPITAL_COMMUNITY): Payer: Self-pay | Admitting: *Deleted

## 2011-10-14 DIAGNOSIS — Z79899 Other long term (current) drug therapy: Secondary | ICD-10-CM | POA: Insufficient documentation

## 2011-10-14 DIAGNOSIS — I10 Essential (primary) hypertension: Secondary | ICD-10-CM | POA: Diagnosis present

## 2011-10-14 DIAGNOSIS — F411 Generalized anxiety disorder: Secondary | ICD-10-CM | POA: Insufficient documentation

## 2011-10-14 DIAGNOSIS — F1010 Alcohol abuse, uncomplicated: Secondary | ICD-10-CM

## 2011-10-14 DIAGNOSIS — F1020 Alcohol dependence, uncomplicated: Secondary | ICD-10-CM | POA: Insufficient documentation

## 2011-10-14 DIAGNOSIS — F419 Anxiety disorder, unspecified: Secondary | ICD-10-CM

## 2011-10-14 DIAGNOSIS — F10988 Alcohol use, unspecified with other alcohol-induced disorder: Secondary | ICD-10-CM | POA: Diagnosis present

## 2011-10-14 DIAGNOSIS — Z7982 Long term (current) use of aspirin: Secondary | ICD-10-CM | POA: Insufficient documentation

## 2011-10-14 DIAGNOSIS — F3289 Other specified depressive episodes: Secondary | ICD-10-CM | POA: Insufficient documentation

## 2011-10-14 DIAGNOSIS — F331 Major depressive disorder, recurrent, moderate: Secondary | ICD-10-CM

## 2011-10-14 DIAGNOSIS — F329 Major depressive disorder, single episode, unspecified: Secondary | ICD-10-CM | POA: Insufficient documentation

## 2011-10-14 DIAGNOSIS — F1021 Alcohol dependence, in remission: Secondary | ICD-10-CM | POA: Diagnosis present

## 2011-10-14 DIAGNOSIS — F10239 Alcohol dependence with withdrawal, unspecified: Principal | ICD-10-CM | POA: Diagnosis present

## 2011-10-14 DIAGNOSIS — F10939 Alcohol use, unspecified with withdrawal, unspecified: Principal | ICD-10-CM | POA: Diagnosis present

## 2011-10-14 DIAGNOSIS — F17200 Nicotine dependence, unspecified, uncomplicated: Secondary | ICD-10-CM | POA: Insufficient documentation

## 2011-10-14 DIAGNOSIS — N390 Urinary tract infection, site not specified: Secondary | ICD-10-CM

## 2011-10-14 LAB — URINE MICROSCOPIC-ADD ON

## 2011-10-14 LAB — CBC
MCH: 30.7 pg (ref 26.0–34.0)
MCHC: 34.6 g/dL (ref 30.0–36.0)
RDW: 15.1 % (ref 11.5–15.5)

## 2011-10-14 LAB — BASIC METABOLIC PANEL
BUN: 8 mg/dL (ref 6–23)
Chloride: 98 mEq/L (ref 96–112)
Creatinine, Ser: 0.83 mg/dL (ref 0.50–1.10)
GFR calc Af Amer: 78 mL/min — ABNORMAL LOW (ref >90)
GFR calc non Af Amer: 67 mL/min — ABNORMAL LOW (ref >90)
Potassium: 4.3 mEq/L (ref 3.5–5.1)

## 2011-10-14 LAB — RAPID URINE DRUG SCREEN, HOSP PERFORMED
Barbiturates: NOT DETECTED
Benzodiazepines: NOT DETECTED
Cocaine: NOT DETECTED

## 2011-10-14 LAB — URINALYSIS, ROUTINE W REFLEX MICROSCOPIC
Glucose, UA: NEGATIVE mg/dL
Ketones, ur: NEGATIVE mg/dL
Nitrite: POSITIVE — AB
Specific Gravity, Urine: 1.005 (ref 1.005–1.030)
pH: 6 (ref 5.0–8.0)

## 2011-10-14 LAB — DIFFERENTIAL
Basophils Absolute: 0 10*3/uL (ref 0.0–0.1)
Basophils Relative: 0 % (ref 0–1)
Eosinophils Absolute: 0 10*3/uL (ref 0.0–0.7)
Neutro Abs: 3.5 10*3/uL (ref 1.7–7.7)
Neutrophils Relative %: 60 % (ref 43–77)

## 2011-10-14 LAB — ETHANOL: Alcohol, Ethyl (B): <11 mg/dL (ref 0–11)

## 2011-10-14 MED ORDER — BUSPIRONE HCL 10 MG PO TABS
10.0000 mg | ORAL_TABLET | Freq: Three times a day (TID) | ORAL | Status: DC
  Administered 2011-10-15 – 2011-10-19 (×14): 10 mg via ORAL
  Filled 2011-10-14: qty 1
  Filled 2011-10-14: qty 42
  Filled 2011-10-14: qty 1
  Filled 2011-10-14: qty 42
  Filled 2011-10-14 (×12): qty 1
  Filled 2011-10-14: qty 42
  Filled 2011-10-14 (×2): qty 1

## 2011-10-14 MED ORDER — FLUTICASONE-SALMETEROL 100-50 MCG/DOSE IN AEPB
1.0000 | INHALATION_SPRAY | Freq: Two times a day (BID) | RESPIRATORY_TRACT | Status: DC
  Administered 2011-10-14 – 2011-10-19 (×10): 1 via RESPIRATORY_TRACT
  Filled 2011-10-14 (×2): qty 14

## 2011-10-14 MED ORDER — ONDANSETRON 8 MG PO TBDP: 8.0000 mg | ORAL_TABLET | Freq: Once | ORAL | Status: DC

## 2011-10-14 MED ORDER — CEPHALEXIN 500 MG PO CAPS
500.0000 mg | ORAL_CAPSULE | Freq: Once | ORAL | Status: AC
  Administered 2011-10-14: 500 mg via ORAL
  Filled 2011-10-14: qty 1

## 2011-10-14 MED ORDER — CEPHALEXIN 500 MG PO CAPS: 500.0000 mg | ORAL_CAPSULE | Freq: Four times a day (QID) | ORAL | Status: DC

## 2011-10-14 MED ORDER — FELODIPINE ER 10 MG PO TB24
10.0000 mg | ORAL_TABLET | Freq: Every day | ORAL | Status: DC
  Administered 2011-10-15 – 2011-10-19 (×5): 10 mg via ORAL
  Filled 2011-10-14 (×4): qty 1
  Filled 2011-10-14: qty 7
  Filled 2011-10-14 (×2): qty 1

## 2011-10-14 MED ORDER — FLUOXETINE HCL 20 MG PO CAPS
40.0000 mg | ORAL_CAPSULE | Freq: Every day | ORAL | Status: DC
  Administered 2011-10-15 – 2011-10-18 (×4): 40 mg via ORAL
  Filled 2011-10-14: qty 28
  Filled 2011-10-14 (×5): qty 2

## 2011-10-14 MED ORDER — ALUM & MAG HYDROXIDE-SIMETH 200-200-20 MG/5ML PO SUSP: 30.0000 mL | ORAL | Status: DC | PRN

## 2011-10-14 MED ORDER — ACETAMINOPHEN 325 MG PO TABS
650.0000 mg | ORAL_TABLET | Freq: Four times a day (QID) | ORAL | Status: DC | PRN
  Administered 2011-10-17: 650 mg via ORAL

## 2011-10-14 MED ORDER — HYDROXYZINE HCL 25 MG PO TABS
25.0000 mg | ORAL_TABLET | Freq: Every evening | ORAL | Status: DC | PRN
  Administered 2011-10-14: 25 mg via ORAL
  Filled 2011-10-14: qty 1

## 2011-10-14 MED ORDER — ATENOLOL 25 MG PO TABS
25.0000 mg | ORAL_TABLET | Freq: Every day | ORAL | Status: DC
  Administered 2011-10-15 – 2011-10-19 (×5): 25 mg via ORAL
  Filled 2011-10-14: qty 7
  Filled 2011-10-14 (×6): qty 1

## 2011-10-14 MED ORDER — GABAPENTIN 100 MG PO CAPS
100.0000 mg | ORAL_CAPSULE | Freq: Once | ORAL | Status: AC
  Administered 2011-10-14: 100 mg via ORAL
  Filled 2011-10-14 (×2): qty 1

## 2011-10-14 MED ORDER — NICOTINE 14 MG/24HR TD PT24
14.0000 mg | MEDICATED_PATCH | Freq: Once | TRANSDERMAL | Status: DC
  Administered 2011-10-14: 14 mg via TRANSDERMAL
  Filled 2011-10-14: qty 1

## 2011-10-14 MED ORDER — GABAPENTIN 300 MG PO CAPS
300.0000 mg | ORAL_CAPSULE | Freq: Three times a day (TID) | ORAL | Status: DC
  Administered 2011-10-15 – 2011-10-19 (×14): 300 mg via ORAL
  Filled 2011-10-14: qty 1
  Filled 2011-10-14: qty 42
  Filled 2011-10-14 (×14): qty 1
  Filled 2011-10-14: qty 42
  Filled 2011-10-14: qty 1
  Filled 2011-10-14: qty 42

## 2011-10-14 MED ORDER — SULFAMETHOXAZOLE-TMP DS 800-160 MG PO TABS
1.0000 | ORAL_TABLET | Freq: Two times a day (BID) | ORAL | Status: DC
  Administered 2011-10-14 – 2011-10-16 (×4): 1 via ORAL
  Filled 2011-10-14 (×9): qty 1

## 2011-10-14 MED ORDER — GABAPENTIN 100 MG PO CAPS
100.0000 mg | ORAL_CAPSULE | Freq: Every day | ORAL | Status: DC
  Administered 2011-10-14: 100 mg via ORAL
  Filled 2011-10-14 (×2): qty 1

## 2011-10-14 MED ORDER — NITROGLYCERIN 0.4 MG SL SUBL: 0.4000 mg | SUBLINGUAL_TABLET | SUBLINGUAL | Status: DC | PRN

## 2011-10-14 MED ORDER — MAGNESIUM HYDROXIDE 400 MG/5ML PO SUSP: 30.0000 mL | Freq: Every day | ORAL | Status: DC | PRN

## 2011-10-14 NOTE — ED Notes (Signed)
Alexandria Chen with act team trying to get pt accepted at [facility].

## 2011-10-14 NOTE — Progress Notes (Signed)
Pt. States she takes care of 4 grandchildren ages 72,13 and 25 and 50. She states her nerves get really bad and she resorts to drinking. In addtion her 75 year old mother lives across the street and is constantly calling her all day long and at night too. She states it gets to be too much for her and she finds she worries about everything even the rain. Contracts for safety and denies Si or Hi.

## 2011-10-14 NOTE — ED Notes (Signed)
Pt has prescription for keflex 500mg  1 cap po qid dispense 28.  Called [facility] and spoke with [facility], CHarge RN and she said she will make sure the pt gets the medication/prescription.

## 2011-10-14 NOTE — Tx Team (Signed)
Initial Interdisciplinary Treatment Plan  PATIENT STRENGTHS: (choose at least two) Ability for insight Average or above average intelligence General fund of knowledge Supportive family/friends  PATIENT STRESSORS: Substance abuse Taking care of her young grandchildren and just does not have the patients for it anymore  Worries about her 75 year old mother   PROBLEM LIST: Problem List/Patient Goals Date to be addressed Date deferred Reason deferred Estimated date of resolution  "Substance Abuse" 10/14/11     "Anxiety" 10/14/11                                                DISCHARGE CRITERIA:  Ability to meet basic life and health needs Withdrawal symptoms are absent or subacute and managed without 24-hour nursing intervention  PRELIMINARY DISCHARGE PLAN: Attend aftercare/continuing care group Attend 12-step recovery group Outpatient therapy Return to previous living arrangement  PATIENT/FAMIILY INVOLVEMENT: This treatment plan has been presented to and reviewed with the patient, Alexandria Chen, and/or family member.  The patient and family have been given the opportunity to ask questions and make suggestions.  [name] 10/14/2011, 6:40 PM

## 2011-10-14 NOTE — BH Assessment (Signed)
Assessment Note   Alexandria Chen is an 77 y.o. female. Patient has been sent to ED by current provider for inpatient detox. Patient states that she has had a lot of things going in her life that has increased her stress level. Her husband passed away 3 years ago and she has a 48 yo sick mother who lives across the street from her that calls her several times a day, and she states several other just life things that she is dealing with, States " I have bad nerves." She states that she has been doing well for several years and relapsed about a month and a half ago she had been keeping it from her children and they recently found out. She was last at [facility] in 2006 for detox. She endorses decreased sleep and poor appetite with no noted weight loss. She endorses "passing thoughts" of suicidality; comments like maybe I should do this or that. She states that she would never do anything to hurt herself because she needs to be here for her mother and family. Patient is independent in her ADL's , capable of participating in groups and appropriate for milieu. She states that she is ready for detox because drinking has caused her to have a decrease in energy and feels that it is affecting her health.  Axis I: 303.9 Alcohol Dependence, 311 Depressive Disorder NOS Axis II: Deferred Axis III:  Past Medical History  Diagnosis Date  . HTN (hypertension)   . Depression   . Anxiety    Axis IV: other psychosocial or environmental problems, problems related to social environment and problems with primary support group Axis V: 35  Past Medical History:  Past Medical History  Diagnosis Date  . HTN (hypertension)   . Depression   . Anxiety     History reviewed. No pertinent past surgical history.  Family History:  Family History  Problem Relation Age of Onset  . Anxiety disorder Mother     Social History:  reports that she has been smoking Cigarettes.  She has a 30 pack-year smoking history. She has never used  smokeless tobacco. She reports that she drinks alcohol. She reports that she does not use illicit drugs.  Additional Social History:  Alcohol / Drug Use History of alcohol / drug use?: Yes Substance #1 Name of Substance 1: Alcohol/Beer 1 - Age of First Use: 20's 1 - Amount (size/oz): 6 pack 1 - Frequency: Daily 1 - Duration: 1.5 months 1 - Last Use / Amount: yesterday/ 6 pack  CIWA: CIWA-Ar BP: 132/65 mmHg Pulse Rate: 78  Nausea and Vomiting: no nausea and no vomiting Tactile Disturbances: none Tremor: not visible, but can be felt fingertip to fingertip Auditory Disturbances: not present Paroxysmal Sweats: no sweat visible Visual Disturbances: not present Anxiety: mildly anxious Headache, Fullness in Head: none present Agitation: normal activity Orientation and Clouding of Sensorium: oriented and can do serial additions CIWA-Ar Total: 2  COWS:    Allergies: No Known Allergies  Home Medications:  (Not in a hospital admission)  OB/GYN Status:  No LMP recorded.  General Assessment Data Location of Assessment: [facility] ACT Assessment: Yes Living Arrangements: Other relatives (24yo grandson) Can pt return to current living arrangement?: Yes Admission Status: Voluntary Is patient capable of signing voluntary admission?: Yes Transfer from: Acute Hospital Referral Source: MD  Education Status Is patient currently in school?: No Contact person:  Field seismologist Lewis/Daughter/[phone])  Risk to self Suicidal Ideation: No Suicidal Intent: No Is patient at risk  for suicide?: No Suicidal Plan?: No Access to Means: No What has been your use of drugs/alcohol within the last 12 months?:  (Daily use) Previous Attempts/Gestures: No How many times?:  (NA) Other Self Harm Risks:  (None reported) Triggers for Past Attempts: Other (Comment) (None reported) Intentional Self Injurious Behavior: None Family Suicide History: No Recent stressful life event(s): Loss (Comment) (husband  passed 3 years ago/ 96yo mother sick) Persecutory voices/beliefs?: No Depression: Yes Depression Symptoms: Insomnia;Guilt Substance abuse history and/or treatment for substance abuse?: Yes Suicide prevention information given to non-admitted patients: Not applicable  Risk to Others Homicidal Ideation: No Thoughts of Harm to Others: No Current Homicidal Intent: No Current Homicidal Plan: No Access to Homicidal Means: No Identified Victim:  (Na) History of harm to others?: No Assessment of Violence: None Noted Violent Behavior Description:  (None) Does patient have access to weapons?: No Criminal Charges Pending?: No Does patient have a court date: No  Psychosis Hallucinations: None noted Delusions: None noted  Mental Status Report Appear/Hygiene:  (WNL) Eye Contact: Good Motor Activity: Freedom of movement;Unremarkable Speech: Logical/coherent Level of Consciousness: Alert Mood: Depressed Affect: Appropriate to circumstance Anxiety Level: Minimal Thought Processes: Coherent;Relevant Judgement: Unimpaired Orientation: Person;Place;Time;Situation Obsessive Compulsive Thoughts/Behaviors: Minimal  Cognitive Functioning Concentration: Decreased Memory: Recent Intact;Remote Intact IQ: Average Insight: Good Impulse Control: Poor Appetite: Poor Weight Loss:  (None noted) Weight Gain:  (None noted) Sleep: Decreased Total Hours of Sleep:  (Broken) Vegetative Symptoms: None  ADLScreening [facility] Assessment Services) Patient's cognitive ability adequate to safely complete daily activities?: Yes Patient able to express need for assistance with ADLs?: Yes Independently performs ADLs?: Yes  Abuse/Neglect [facility]) Physical Abuse: Denies Verbal Abuse: Denies Sexual Abuse: Denies  Prior Inpatient Therapy Prior Inpatient Therapy: Yes Prior Therapy Dates:  (2004 & 2006) Prior Therapy Facilty/Provider(s):  [facility]) Reason for Treatment:  (Detox)  Prior Outpatient Therapy Prior  Outpatient Therapy: Yes Prior Therapy Dates:  (Current) Prior Therapy Facilty/Provider(s):  (Dr. Lolly Mustache) Reason for Treatment:  (Depression, Anxiety)  ADL Screening (condition at time of admission) Patient's cognitive ability adequate to safely complete daily activities?: Yes Patient able to express need for assistance with ADLs?: Yes Independently performs ADLs?: Yes       Abuse/Neglect Assessment (Assessment to be complete while patient is alone) Physical Abuse: Denies Verbal Abuse: Denies Sexual Abuse: Denies Exploitation of patient/patient's resources: Denies Self-Neglect: Denies Values / Beliefs Cultural Requests During Hospitalization: None Spiritual Requests During Hospitalization: None        Additional Information 1:1 In Past 12 Months?: No CIRT Risk: No Elopement Risk: No Does patient have medical clearance?: Yes     Disposition:  Disposition Disposition of Patient: Inpatient treatment program Type of inpatient treatment program: Adult  On Site Evaluation by:   Reviewed with Physician:     Rudi Coco 10/14/2011 12:25 PM

## 2011-10-14 NOTE — ED Notes (Signed)
Reported to Kalman Drape RN with [facility], was told will be here in approx for pt.

## 2011-10-14 NOTE — Progress Notes (Signed)
Patient:  Alexandria Chen   DOB: May 05, 1934  MR Number: [id number]  Location: [facility]:  [address] [place],  [place], [address]  Start: Thursday 10/14/2011 10:10AM End: Thursday 10/14/2011 10:45 AM  Provider/Observer:     Florencia Reasons, MSW, LCSW   Chief Complaint:      Chief Complaint  Patient presents with  . Depression  . Anxiety  . Alcohol Problem    Reason For Service:     The patient is a 77 year old African American female who has a long-standing history of recurrent periods of symptoms of depression and anxiety. Patient along with her 2 daughters who accompany her to the appointment today shared that patient relapsed about a month ago and has been drinking approximately a 6 pack of beer daily. Patient also reports agitation, anxiety, depressed mood, and isolative behaviors.  Interventions Strategy:  Supportive therapy,  Participation Level:   Active  Participation Quality:  Appropriate      Behavioral Observation:   Fidgety, anxious, some confusion re: amount of alcohol use and length of alcohol use.  Current Psychosocial Factors: The patient's 100 year old mother has health issues and is experiencing increased confusion and memory difficulty.  Content of Session:   Reviewing symptoms, identifying stressors, discussing alcohol use and treatment options, facilitating referral for ACT Team assessment for detox  Current Status:   The patient increased anxiety, agitation, excessive worry, and isolative behaviors.  Her daughters also report that patient sleeps excessively.  Patient Progress:   Poor. The patient along with her daughters report that patient has relapsed and has been drinking a 6 pack of beer daily for approximately one and half months. Patient reports becoming overwhelmed regarding a variety of issues including concerns regarding her 79 year old mother and issues with her grandson who resides in patient's home. She reports resuming alcohol use to try  to calm herself because she has been so nervous. She maintains that she has been using medication correctly but seems to express some confusion regarding medication use in the evenings. Patient does not want to go to detox but is willing to participate in the program. Therapist works with patient and her daughters to facilitate patient going to the ER and being assessed by ACT team. Patient's daughters agree to take patient to [facility] ER. Therapist contacts triage nurse as well as the ACT Team assessment counselor.   Target Goals:    Last Reviewed:     Goals Addressed Today:      Impression/Diagnosis:   The patient has a long-standing history of recurrent periods of depression and anxiety.  Her symptoms have included depressed mood, social withdrawal, lack of interest in activities, poor motivation, excessive  worry, feeling jittery, and ruminating thoughts.  Patient also has a history of alcohol dependence and recently has relapsed. Diagnoses: Maj. depressive disorder, recurrent, moderate; generalized anxiety disorder, alcohol dependence  Diagnosis:  Axis I:  1. Major depressive disorder, recurrent, moderate   2. GAD (generalized anxiety disorder)   3. Alcohol dependence             Axis II: No diagnosis

## 2011-10-14 NOTE — ED Provider Notes (Signed)
History   This chart was scribed for Alexandria Lennert, MD by Clarita Crane. The patient was seen in room APA15/[facility]15. Patient's care was started at 1055.    CSN: [id number]  Arrival date & time 10/14/11  1055   First MD Initiated Contact with Patient 10/14/11 1209      Chief Complaint  Patient presents with  . V70.1    (Consider location/radiation/quality/duration/timing/severity/associated sxs/prior treatment) Patient is a 77 y.o. female presenting with alcohol problem. The history is provided by the patient.  Alcohol Problem This is a chronic problem. The current episode started more than 1 week ago. The problem occurs constantly. The problem has not changed since onset.Pertinent negatives include no chest pain, no abdominal pain and no headaches.   CISSY GALBREATH is a 77 y.o. female who presents to the Emergency Department seeking enrollment in dextoxification program to aid her with alcohol abuse. States she would prefer enrollment in an in patient setting if possible.Patient reports abusing beer primarily and states she drinks approximately 144oz of beer per day. Denies SI/HI. Patient with h/o HTN, depression, anxiety.   Past Medical History  Diagnosis Date  . HTN (hypertension)   . Depression   . Anxiety     History reviewed. No pertinent past surgical history.  Family History  Problem Relation Age of Onset  . Anxiety disorder Mother     History  Substance Use Topics  . Smoking status: Current Everyday Smoker -- 1.0 packs/day for 30 years    Types: Cigarettes  . Smokeless tobacco: Never Used  . Alcohol Use: Yes     6 24oz beers /day    OB History    Grav Para Term Preterm Abortions TAB SAB Ect Mult Living                  Review of Systems  Constitutional: Negative for fatigue.  HENT: Negative for congestion, sinus pressure and ear discharge.   Eyes: Negative for discharge.  Respiratory: Negative for cough.   Cardiovascular: Negative for chest pain.    Gastrointestinal: Negative for abdominal pain and diarrhea.  Genitourinary: Negative for frequency and hematuria.  Musculoskeletal: Negative for back pain.  Skin: Negative for rash.  Neurological: Negative for seizures and headaches.  Hematological: Negative.   Psychiatric/Behavioral: Negative for suicidal ideas and hallucinations.       +Alcohol abuse    Allergies  Review of patient's allergies indicates no known allergies.  Home Medications   Current Outpatient Rx  Name Route Sig Dispense Refill  . ASPIRIN 81 MG PO TABS Oral Take 81 mg by mouth daily.    . ATENOLOL 25 MG PO TABS Oral Take 25 mg by mouth daily.     . ATORVASTATIN CALCIUM 40 MG PO TABS Oral Take 40 mg by mouth daily.    . BUSPIRONE HCL 10 MG PO TABS Oral Take 1 tablet (10 mg total) by mouth 3 (three) times daily. 90 tablet 0  . VITAMIN D3 1000 UNITS PO CAPS Oral Take 1,000 Units by mouth daily.     Marland Kitchen FELODIPINE ER 10 MG PO TB24 Oral Take 10 mg by mouth daily.     . OMEGA-3 FATTY ACIDS 1000 MG PO CAPS Oral Take 3 g by mouth daily.     Marland Kitchen FLUOXETINE HCL 40 MG PO CAPS Oral Take 1 capsule (40 mg total) by mouth daily. 30 capsule 0  . MECLIZINE HCL 25 MG PO TABS Oral Take 12.5 mg by mouth 2 (two) times  daily between meals as needed.    Marland Kitchen NAPROXEN SODIUM 220 MG PO TABS Oral Take 220 mg by mouth 2 (two) times daily with a meal.    . NITROGLYCERIN 0.4 MG SL SUBL Sublingual Place 1 tablet (0.4 mg total) under the tongue every 5 (five) minutes as needed for chest pain. 25 tablet 4    BP 132/65  Pulse 78  Temp 97.6 F (36.4 C) (Oral)  Resp 20  Ht 5\' 1"  (1.549 m)  Wt 141 lb (63.957 kg)  BMI 26.64 kg/m2  SpO2 100%  Physical Exam  Nursing note and vitals reviewed. Constitutional: She is oriented to person, place, and time. She appears well-developed and well-nourished. No distress.  HENT:  Head: Normocephalic and atraumatic.  Eyes: EOM are normal. Pupils are equal, round, and reactive to light.  Neck: Neck supple. No  tracheal deviation present.  Cardiovascular: Normal rate and regular rhythm.  Exam reveals no gallop and no friction rub.   No murmur heard. Pulmonary/Chest: Effort normal. No respiratory distress. She has no wheezes. She has no rales.  Abdominal: Soft. She exhibits no distension.  Musculoskeletal: Normal range of motion. She exhibits no edema.  Neurological: She is alert and oriented to person, place, and time. No sensory deficit.  Skin: Skin is warm and dry.  Psychiatric: She has a normal mood and affect. Her speech is normal and behavior is normal. She is not agitated. She expresses no homicidal and no suicidal ideation. She expresses no suicidal plans and no homicidal plans.    ED Course  Procedures (including critical care time)  DIAGNOSTIC STUDIES: Oxygen Saturation is 100% on room air, normal by my interpretation.    COORDINATION OF CARE: 12:20PM- ACT team already consulted   Labs Reviewed  BASIC METABOLIC PANEL - Abnormal; Notable for the following:    Glucose, Bld 129 (*)     GFR calc non Af Amer 67 (*)     GFR calc Af Amer 78 (*)     All other components within normal limits  URINALYSIS, ROUTINE W REFLEX MICROSCOPIC - Abnormal; Notable for the following:    Hgb urine dipstick TRACE (*)     Nitrite POSITIVE (*)     Leukocytes, UA LARGE (*)     All other components within normal limits  URINE MICROSCOPIC-ADD ON - Abnormal; Notable for the following:    Bacteria, UA MANY (*)     All other components within normal limits  CBC  DIFFERENTIAL  ETHANOL  URINE RAPID DRUG SCREEN (HOSP PERFORMED)   No results found.   No diagnosis found.    MDM        The chart was scribed for me under my direct supervision.  I personally performed the history, physical, and medical decision making and all procedures in the evaluation of this patient.Alexandria Lennert, MD 10/14/11 1359

## 2011-10-14 NOTE — ED Notes (Signed)
Pt reports wants help with etoh abuse.  Says typically drinkgs 6 24oz beers/day.  Pt says has had fleeting thoughts of suicide but no plan.  Denies HI.  Reports last had etoh yesterday around 8pm.

## 2011-10-14 NOTE — Progress Notes (Addendum)
Patient ID: Alexandria Chen, female   DOB: 1934-11-18, 77 y.o.   MRN: [id number] Pt is voluntary. Pt stated she is here because she stated "I want to get better and stop drinking 6 to 10 beers a day." Pt stated that the more her "nerves" act up the more she will drink. Pt states that her anxiety has become a problem and her patient in her old age has run thin so her grandchildren she takes care of are making her anxious. Pt states when she becomes anxious she drinks to calm herself down. Pt states that her stressors she takes on too much like  helping care for her grandchildren and worrying about her 35 year old mother. Pt states every now and then she has thoughts of SI but would not act on it. Pt denies SI/HI. Pt rates depression 2 and anxiety at 8. Pt is pleasant and cooperative.

## 2011-10-14 NOTE — ED Notes (Signed)
Family has all of pt's belongings.

## 2011-10-14 NOTE — ED Notes (Signed)
Pt requesting nicotine patch. edp aware

## 2011-10-14 NOTE — Progress Notes (Signed)
Clinical information provided by Ardelia Mems RN with ACT and presented to Dr. Koren Shiver by this writer. Patient accepted and assigned to room 307-2. Doristine Locks RN [facility]

## 2011-10-15 DIAGNOSIS — F411 Generalized anxiety disorder: Secondary | ICD-10-CM

## 2011-10-15 DIAGNOSIS — F1021 Alcohol dependence, in remission: Secondary | ICD-10-CM | POA: Diagnosis present

## 2011-10-15 MED ORDER — LORAZEPAM 1 MG PO TABS
1.0000 mg | ORAL_TABLET | Freq: Four times a day (QID) | ORAL | Status: DC
  Administered 2011-10-15: 1 mg via ORAL
  Filled 2011-10-15: qty 1

## 2011-10-15 MED ORDER — LORAZEPAM 1 MG PO TABS: 1.0000 mg | ORAL_TABLET | Freq: Four times a day (QID) | ORAL | Status: DC | PRN

## 2011-10-15 MED ORDER — LORAZEPAM 1 MG PO TABS: 1.0000 mg | ORAL_TABLET | Freq: Three times a day (TID) | ORAL | Status: DC | PRN

## 2011-10-15 MED ORDER — LORAZEPAM 1 MG PO TABS: 1.0000 mg | ORAL_TABLET | Freq: Two times a day (BID) | ORAL | Status: DC

## 2011-10-15 MED ORDER — LORAZEPAM 1 MG PO TABS
1.0000 mg | ORAL_TABLET | Freq: Two times a day (BID) | ORAL | Status: DC
  Administered 2011-10-17 – 2011-10-19 (×5): 1 mg via ORAL
  Filled 2011-10-15: qty 6
  Filled 2011-10-15 (×5): qty 1

## 2011-10-15 MED ORDER — NICOTINE 14 MG/24HR TD PT24
14.0000 mg | MEDICATED_PATCH | Freq: Every day | TRANSDERMAL | Status: DC
  Administered 2011-10-15 – 2011-10-19 (×5): 14 mg via TRANSDERMAL
  Filled 2011-10-15 (×9): qty 1

## 2011-10-15 MED ORDER — ATORVASTATIN CALCIUM 40 MG PO TABS
40.0000 mg | ORAL_TABLET | Freq: Every day | ORAL | Status: DC
  Administered 2011-10-15 – 2011-10-19 (×6): 40 mg via ORAL
  Filled 2011-10-15 (×4): qty 1
  Filled 2011-10-15: qty 7
  Filled 2011-10-15 (×3): qty 1

## 2011-10-15 MED ORDER — LORAZEPAM 1 MG PO TABS
1.0000 mg | ORAL_TABLET | Freq: Three times a day (TID) | ORAL | Status: AC
  Administered 2011-10-16 (×3): 1 mg via ORAL
  Filled 2011-10-15 (×3): qty 1

## 2011-10-15 MED ORDER — VITAMIN D 1000 UNITS PO TABS
1000.0000 [IU] | ORAL_TABLET | Freq: Every day | ORAL | Status: DC
  Administered 2011-10-15 – 2011-10-19 (×5): 1000 [IU] via ORAL
  Filled 2011-10-15 (×8): qty 1

## 2011-10-15 MED ORDER — CEPHALEXIN 500 MG PO CAPS: 500.0000 mg | ORAL_CAPSULE | Freq: Four times a day (QID) | ORAL | Status: DC

## 2011-10-15 NOTE — Progress Notes (Signed)
Patient ID: Alexandria Chen, female   DOB: March 22, 1935, 77 y.o.   MRN: [id number]   Patient lying in bed with eyes closed. Appears to be sleeping. Respirations even and non-labored. Staff will continue to monitor.

## 2011-10-15 NOTE — Progress Notes (Signed)
Pt attended discharge planning group and actively participated.  Pt presents with calm mood and affect.  Pt was open with sharing reason for entering the hospital.  Pt states that she started drinking 1.5 months ago after being sober since 2006.  Pt states that she was able to stay sober that long by doing things she loves such as gardening, cooking and reading.  Pt states that her nerves were bad due to a lot of things going on.  Pt states that this caused her to get 1 beer.  Pt states that she thought she could have just 1 beer but continued to drink more.  Pt states that she doesn't want to go to rehab and is open to trying AA meetings.  Pt states that she lives in [place] with her grandson and has transportation home.  Pt rates depression at a 4 and anxiety at a 6-7 today.  Pt denies SI today.  SW will assess for appropriate referrals.  No further needs voiced by pt at this time.   Reyes Ivan, LCSWA 10/15/2011  10:02 AM    Per State Regulation 482.30 This Chart was reviewed for medical necessity with respect to the patient's Admission/Duration of stay.   Carmina Miller  10/15/2011  Next Review Date:  10/18/11

## 2011-10-15 NOTE — Progress Notes (Signed)
D.  Pt is a 77 year old AAF admitted for detox from ETOH.  Pt had been drinking a six pack of beer daily for the last month and a half.  She had been sober nearly six years prior to this.  Pt has been resting in her room this evening and did not attend group due to being very sleepy from scheduled ativan dose.  Pt did awaken readily to take her evening medication.  Denies SI/HI/hallucinations at this time.  No s/s of withdrawal apparent.  A.  Support and encouragement offered.  Explained medication detox schedule.  R.  Pt verbalized understanding, will continue to monitor for safety

## 2011-10-15 NOTE — Progress Notes (Signed)
Psychoeducational Group Note  Date:  10/15/2011 Time:  1015  Group Topic/Focus:  Relapse Prevention Planning:   The focus of this group is to define relapse and discuss the need for planning to combat relapse.  Participation Level:  Active  Participation Quality:  Appropriate, Sharing and Supportive  Affect:  Appropriate and Flat  Cognitive:  Alert and Appropriate  Insight:  Good  Engagement in Group:  Good  Additional Comments:  Pt gave good insight to other pts in group and shared about own past  personal experience about alcohol addiction.   Dalia Heading 10/15/2011, 2:05 PM

## 2011-10-15 NOTE — H&P (Signed)
Psychiatric Admission Assessment Adult  Patient Identification:  Alexandria Chen Date of Evaluation:  10/15/2011 Chief Complaint:  Alcohol Dependence History of Present Illness: This is a voluntary admission for this 77 year old AA female who goes by Alexandria Chen.  She presented to the ED requesting help with alcohol detox.  She relapsed 1.5 months ago after a 5-6 years of sobriety.  She states that she was drinking a 6 pack a day. She endorses poor sleep decreased appetite, rates her depression as a 2-3 out of 10.  She denies suicidal ideation, states no AH/VH.  She does rate her anxiety as a 4/10.  She denies feelings of worthlessness, hopelessness, or helplessness.  Past Psychiatric History: Diagnosis:  Alcohol detox  Hospitalizations:  X 2 at [facility] 2004 and 2006  Outpatient Care:  Dr. Lolly Mustache  Substance Abuse Care:  Self-Mutilation:  Suicidal Attempts:  Violent Behaviors:   Past Medical History:   Past Medical History  Diagnosis Date  . HTN (hypertension)   . Depression   . Anxiety    None. Allergies:  No Known Allergies PTA Medications: Prescriptions prior to admission  Medication Sig Dispense Refill  . aspirin 81 MG tablet Take 81 mg by mouth daily.      Marland Kitchen atenolol (TENORMIN) 25 MG tablet Take 25 mg by mouth daily.       Marland Kitchen atorvastatin (LIPITOR) 40 MG tablet Take 40 mg by mouth daily.      . busPIRone (BUSPAR) 10 MG tablet Take 1 tablet (10 mg total) by mouth 3 (three) times daily.  90 tablet  0  . cephALEXin (KEFLEX) 500 MG capsule Take 1 capsule (500 mg total) by mouth 4 (four) times daily.  28 capsule  0  . Cholecalciferol (VITAMIN D3) 1000 UNITS CAPS Take 1,000 Units by mouth daily.       . felodipine (PLENDIL) 10 MG 24 hr tablet Take 10 mg by mouth daily.       . fish oil-omega-3 fatty acids 1000 MG capsule Take 3 g by mouth daily.       Marland Kitchen FLUoxetine (PROZAC) 40 MG capsule Take 1 capsule (40 mg total) by mouth daily.  30 capsule  0  . meclizine (ANTIVERT) 25 MG tablet Take 12.5  mg by mouth 2 (two) times daily between meals as needed.      . naproxen sodium (ANAPROX) 220 MG tablet Take 220 mg by mouth 2 (two) times daily with a meal.        Previous Psychotropic Medications:  Medication/Dose                 Substance Abuse History in the last 12 months: Substance Age of 1st Use Last Use Amount Specific Type  Nicotine      Alcohol      Cannabis      Opiates      Cocaine      Methamphetamines      LSD      Ecstasy      Benzodiazepines      Caffeine      Inhalants      Others:                         Consequences of Substance Abuse:   Social History: Current Place of Residence:  [place], [place] Place of Birth:   Family Members: Marital Status:  Widowed Children:  Sons:  Daughters: Relationships: Education:  [employer] Problems/Performance: Religious Beliefs/Practices:  History of Abuse (Emotional/Phsycial/Sexual) Occupational Experiences; Military History:  None. Legal History: Hobbies/Interests:  Family History:   Family History  Problem Relation Age of Onset  . Anxiety disorder Mother     Mental Status Examination/Evaluation: Objective:  Appearance: Casual  Eye Contact::  Fair  Speech:  Clear and Coherent  Volume:  Normal  Mood:  Euthymic  Affect:  Congruent  Thought Process:  Coherent  Orientation:  Full  Thought Content:  WDL  Suicidal Thoughts:  No  Homicidal Thoughts:  No  Memory:  Recent;   Fair  Judgement:  Fair  Insight:  Present  Psychomotor Activity:  Normal  Concentration:  Fair  Recall:  Fair  Akathisia:  No  Handed:    AIMS (if indicated):     Assets:  Communication Skills Desire for Improvement Housing Physical Health Social Support Transportation  Sleep:  Number of Hours: 6.75     Laboratory/X-Ray Psychological Evaluation(s)      Assessment:    AXIS I:  Alcohol dependence AXIS II:  Deferred AXIS III:   Past Medical History  Diagnosis Date  . HTN (hypertension)   .  Depression   . Anxiety    AXIS IV:  problems related to social environment AXIS V:  51-60 moderate symptoms  Treatment Recommendations:  1. Admit for crisis management and stabilization and detox. 2. Treat health problems as indicated. 3  Ativan detox due to age.  Treatment Plan Summary: Treatment Plan Summary:  1. Daily contact with patient to assess and evaluate symptoms and progress in treatment.  2. Medication management  3. The patient will deny suicidal ideations or homicidal ideations for 48 hours prior to discharge and have a depression and anxiety rating of 3 or less. The patient will also deny any auditory or visual hallucinations or delusional thinking.  4. The patient will deny any symptoms of substance withdrawal at time of discharge.   Current Medications:  Current Facility-Administered Medications  Medication Dose Route Frequency Provider Last Rate Last Dose  . acetaminophen (TYLENOL) tablet 650 mg  650 mg Oral Q6H PRN Mike Craze, MD      . alum & mag hydroxide-simeth (MAALOX/MYLANTA) 200-200-20 MG/5ML suspension 30 mL  30 mL Oral Q4H PRN Mike Craze, MD      . atenolol (TENORMIN) tablet 25 mg  25 mg Oral Daily Mike Craze, MD   25 mg at 10/15/11 0843  . busPIRone (BUSPAR) tablet 10 mg  10 mg Oral TID Mike Craze, MD   10 mg at 10/15/11 1730  . felodipine (PLENDIL) 24 hr tablet 10 mg  10 mg Oral Daily Mike Craze, MD   10 mg at 10/15/11 1222  . FLUoxetine (PROZAC) capsule 40 mg  40 mg Oral q1800 Mike Craze, MD   40 mg at 10/15/11 1730  . Fluticasone-Salmeterol (ADVAIR) 100-50 MCG/DOSE inhaler 1 puff  1 puff Inhalation BID Mike Craze, MD   1 puff at 10/15/11 0843  . gabapentin (NEURONTIN) capsule 100 mg  100 mg Oral Once Mike Craze, MD   100 mg at 10/14/11 2111  . gabapentin (NEURONTIN) capsule 300 mg  300 mg Oral TID Mike Craze, MD   300 mg at 10/15/11 1730  . hydrOXYzine (ATARAX/VISTARIL) tablet 25 mg  25 mg Oral QHS PRN Mike Craze, MD    25 mg at 10/14/11 2112  . magnesium hydroxide (MILK OF MAGNESIA) suspension 30 mL  30 mL Oral Daily PRN Mike Craze,  MD      . nitroGLYCERIN (NITROSTAT) SL tablet 0.4 mg  0.4 mg Sublingual Q5 Min x 3 PRN Mike Craze, MD      . sulfamethoxazole-trimethoprim (BACTRIM DS) 800-160 MG per tablet 1 tablet  1 tablet Oral Q12H Mike Craze, MD   1 tablet at 10/15/11 0843  . DISCONTD: gabapentin (NEURONTIN) capsule 100 mg  100 mg Oral QHS Mike Craze, MD   100 mg at 10/14/11 2112    Observation Level/Precautions:  Detox  Laboratory:    Psychotherapy:    Medications:    Routine PRN Medications:  Yes  Consultations:    Discharge Concerns:    Other:     Lloyd Huger T. Atreus Hasz PAC For Dr. Lupe Carney 6/14/20135:33 PM

## 2011-10-15 NOTE — [facility] Suicide Risk Assessment (Signed)
Suicide Risk Assessment  Admission Assessment      Demographic factors:  See chart.  Current Mental Status:  Patient seen and evaluated. Chart reviewed. Patient stated that her mood was "ok". Her affect was mood congruent, yet anxious. She denied any current thoughts of self injurious behavior, suicidal ideation or homicidal ideation. There were no auditory or visual hallucinations, paranoia, delusional thought processes, or mania noted.  Thought process was linear and goal directed.  No psychomotor agitation or retardation was noted. Speech was normal rate, tone and volume. Eye contact was good. Judgment and insight are fair.  Patient has been up and engaged on the unit.  No acute safety concerns reported from team.    Loss Factors: Loss of significant relationship  Historical Factors: Denied hx w/d seizures or DTs; lives with grandson  Risk Reduction Factors: Positive therapeutic relationship;Positive social support;Living with another person, especially a relative;Sense of responsibility to family; motivated for Tx  CLINICAL FACTORS: Alcohol Dependence & W/D; HTN; Anxiety Disorder NOS; r/o SIMD  COGNITIVE FEATURES THAT CONTRIBUTE TO RISK: denied per pt.  Reported nl decline with age.  SUICIDE RISK: Patient is currently viewed as a low risk of harm to herself and others in light of her history and risk factors. There are no acute safety concerns on the unit at this time.  PLAN OF CARE: Pt admitted for crisis stabilization, detox and treatment. Pt restarted on outpt meds minus the Ativan.  Please see orders. Will continue q15 minute checks per unit protocol.  No clinical indication for one on one level of observation at this time.  Pt contracting for safety.  Mental health treatment, medication management and continued sobriety will mitigate against the increased risk of harm to self and/or others.  Discussed the importance of recovery with pt, as well as, tools to move forward in a healthy &  safe manner.  Pt agreeable with the plan.  Discussed with the team.   Alexandria Chen 10/15/2011, 2:21 PM

## 2011-10-15 NOTE — [facility] Counselor (Signed)
Adult Comprehensive Assessment  Patient ID: Alexandria Chen, female   DOB: 1935/04/04, 77 y.o.   MRN: [id number]  Information Source: Information source: Patient  Current Stressors:  Educational / Learning stressors: no issues reported Employment / Job issues: retired Family Relationships: stressed with her 69 year old mother Surveyor, quantity / Lack of resources (include bankruptcy): no issues reported Housing / Lack of housing: no issues reported Physical health (include injuries & life threatening diseases): high blood pressure Social relationships: no issues reported Substance abuse: alcohol daily Bereavement / Loss: friends that have died within the last year  Living/Environment/Situation:  Living Arrangements: Alone Living conditions (as described by patient or guardian): likes living there How long has patient lived in current situation?: 40 years What is atmosphere in current home: Comfortable  Family History:  Marital status: Widowed Widowed, when?: 3 years in January. Married 54 years Does patient have children?: Yes How many children?: 5  (4 girls and 1 boy) How is patient's relationship with their children?: good  Childhood History:  By whom was/is the patient raised?: Grandparents Additional childhood history information: mother stayed at the house with grandparents until she remarried Description of patient's relationship with caregiver when they were a child: good Patient's description of current relationship with people who raised him/her: deceased Does patient have siblings?: Yes Number of Siblings: 4  (2 brothers(1/2) and 2 sisters (1/2)) Description of patient's current relationship with siblings: all dead except one sister, don't have that much contact Did patient suffer any verbal/emotional/physical/sexual abuse as a child?: No Did patient suffer from severe childhood neglect?: No Has patient ever been sexually abused/assaulted/raped as an adolescent or adult?:  No Was the patient ever a victim of a crime or a disaster?: No Witnessed domestic violence?: No Has patient been effected by domestic violence as an adult?: No  Education:  Highest grade of school patient has completed: graduated high school Currently a Consulting civil engineer?: No Learning disability?: No  Employment/Work Situation:   Employment situation:  (Retired at age 87) Patient's job has been impacted by current illness: No What is the longest time patient has a held a job?: 6 years 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 If attempted suicide, did drugs/alcohol play a role in this?: No Alcohol/Substance Abuse Treatment Hx: Past Tx, Inpatient If yes, describe treatment: 2004 and 2006 at [facility] Has alcohol/substance abuse ever caused legal problems?: No  Social Support System:   Patient's Community Support System: Good Describe Community Support System: children Type of faith/religion: [employer] How does patient's faith help to cope with current illness?: faith is not strong currently  Leisure/Recreation:   Leisure and Hobbies: puzzles, reading magazines, cooking, gardening  Strengths/Needs:   What things does the patient do well?: good cook In what areas does patient struggle / problems for patient: my nerves  Discharge Plan:   Does patient have access to transportation?: Yes (one of her children) Will patient be returning to same living situation after discharge?: Yes Currently receiving community mental health services: Yes (From Whom) (Dr. Charlyne Petrin) Does patient  have financial barriers related to discharge medications?: No  Summary/Recommendations:   Summary and Recommendations (to be completed by the evaluator): Patient is a 77 year old African American female with diagnosis of Alcohol Dependence and Depressive D/O. Patient was admitted for detox and she relapsed 1 1/2 months ago. She reported increased anxiety related to elderly mother and just having bad nerves. Patient had some passive suicidal thoughts. Patient would benefit from crisis stabilization, medication evaluation, group therapy and psychoeducation groups to work on coping skills, case management for referrals.       Rinda Rollyson, Aram Beecham. 10/15/2011

## 2011-10-16 DIAGNOSIS — F1020 Alcohol dependence, uncomplicated: Secondary | ICD-10-CM

## 2011-10-16 LAB — URINE CULTURE

## 2011-10-16 MED ORDER — SULFAMETHOXAZOLE-TMP DS 800-160 MG PO TABS
1.0000 | ORAL_TABLET | Freq: Two times a day (BID) | ORAL | Status: AC
  Administered 2011-10-16 – 2011-10-17 (×3): 1 via ORAL
  Filled 2011-10-16 (×3): qty 1

## 2011-10-16 MED ORDER — ASPIRIN 81 MG PO CHEW
81.0000 mg | CHEWABLE_TABLET | Freq: Every day | ORAL | Status: DC
  Administered 2011-10-17 – 2011-10-19 (×3): 81 mg via ORAL
  Filled 2011-10-16: qty 7
  Filled 2011-10-16 (×5): qty 1

## 2011-10-16 NOTE — Progress Notes (Signed)
Patient ID: Alexandria Chen, female   DOB: [birth year], 77 y.o.   MRN: [id number]   [facility] Group Notes:  (Counselor/Nursing/MHT/Case Management/Adjunct)  10/16/2011 1:15 PM  Type of Therapy:  Group Therapy, Dance/Movement Therapy   Participation Level:  Did Not Attend  Cassidi Long

## 2011-10-16 NOTE — Progress Notes (Signed)
Pt sleeping this afternoon. Patient reports that the medications might be making her sleepy. She has been using a wheelchair today due to unsteady gait. Patient is very pleasant and cooperative. Eating and sleeping well. Rates depression at 6 and feeling hopeless at 5. Denies SI. Reports some tremors from ETOH withdrawal. She is currently on an ativan taper. Patient is going down for meals. Remains safe on the unit.

## 2011-10-16 NOTE — Progress Notes (Signed)
Patient ID: Alexandria Chen, female   DOB: November 20, 1934, 77 y.o.   MRN: [id number] Pt. attended and participated in aftercare planning group. Pt. accepted information on suicide prevention, warning signs to look for with suicide and crisis line numbers to use. The pt. agreed to call crisis line numbers if having warning signs or having thoughts of suicide. Pt. listed their current anxiety land depression evel as low.  Pt. accepted an AA meeting schedule.

## 2011-10-16 NOTE — Progress Notes (Signed)
Patient ID: Alexandria Chen, female   DOB: 11-23-1934, 77 y.o.   MRN: [id number]   Detox going well. Some shakes at times. Sleep is good. No other new acute issues.  Mental Status Examination/Evaluation:  Objective: Appearance: Casual   Eye Contact:: Fair   Speech: Clear and Coherent   Volume: Normal   Mood: Euthymic   Affect: Congruent   Thought Process: Coherent   Orientation: Full   Thought Content: WDL   Suicidal Thoughts: No   Homicidal Thoughts: No   Memory: Recent; Fair   Judgement: Fair   Insight: Present   Psychomotor Activity: Normal   Concentration: Fair   Recall: Fair   Akathisia: No   Sleep: Number of Hours: 6.75    Laboratory/X-Ray  Psychological Evaluation(s)      Assessment:  AXIS I: Alcohol dependence  AXIS II: Deferred  AXIS III:  Past Medical History   Diagnosis  Date   .  HTN (hypertension)    .  Depression    .  Anxiety     AXIS IV: problems related to social environment  AXIS V: 51-60 moderate symptoms   Treatment Recommendations:   Continue current meds  Will add aspirin 81 mg qd (home med)

## 2011-10-17 NOTE — Progress Notes (Signed)
Patient ID: Alexandria Chen, female   DOB: 12/24/1934, 77 y.o.   MRN: [id number] 10-17-11 @ 1628 nursing shift note: pt stated she is feeling better and that she is improving.  This am she had a general body ache and prn were given resolving the pain. On her inventory sheet she wrote slept well, appetite improving, energy low, attention good with her depression and hopelessness at "0". Withdrawal symptoms are a mild tremor. She denied any suicide or self harm. She stated some dizzyness as a physical problem. When she goes home she stated she is "finished with beer'. She doesn't see any problem staying on her medications after discharge.  An order was obtained to allow this pt shoes with strings due to being a fall risk. rn will monitor and q 15 min checks continue.

## 2011-10-17 NOTE — Progress Notes (Signed)
[facility] Group Notes:  (Counselor/Nursing/MHT/Case Management/Adjunct)  10/17/2011 5:43 PM  Type of Therapy:  Group Therapy at 11 on Friday 10/15/11  Participation Level:  Active  Participation Quality:  Attentive, Intrusive and Sharing  Affect:  Excited  Cognitive:  Alert and Oriented  Insight:  Limited  Engagement in Group:  Good  Engagement in Therapy:  Limited  Modes of Intervention:  Education, Support and redirection  Summary of Progress/Problems:  Shacola Schussler was attentive to discussion on Post Acute Withdrawal Syndrome (PAWS).  She shared when she wished and about what she wished to talk about; repeatedly about her own experience with relapse.  Patient was responsive to redirection yet repeated same information late in group session.   [facility] Group Notes:  (Counselor/Nursing/MHT/Case Management/Adjunct)  10/17/2011 5:48 PM  Type of Therapy:  Group Therapy at 1:15 on Friday 10/15/11  Participation Level:  Active  Participation Quality:  Attentive and Sharing  Affect:  Depressed  Cognitive:  Alert and Oriented  Insight:  Limited  Engagement in Group:  Limited  Engagement in Therapy:  Limited  Modes of Intervention:  Orientation, Socialization and Support  Summary of Progress/Problems:  Chevon Fomby was attentive to group discussion and orientation as there were multiple new patients today including herself.  Rose was supportive on new group member who had survived suicide attmept and again shared about relapse which prompted her admission.   Clide Dales 10/17/2011, 5:48 PM

## 2011-10-17 NOTE — Progress Notes (Signed)
Patient ID: Alexandria Chen, female   DOB: 1934/11/09, 77 y.o.   MRN: [id number]  Pt. attended aftercare planning group and reported that her anxiety level was 4 and her depression level was 4. Pt. shared that she felt nervous. Pt. verbally accepted suicide prevention information.

## 2011-10-17 NOTE — Progress Notes (Signed)
Patient ID: Alexandria Chen, female   DOB: 11/04/1934, 77 y.o.   MRN: [id number]   [facility] Group Notes:  (Counselor/Nursing/MHT/Case Management/Adjunct)  10/17/2011 1:15 PM  Type of Therapy:  Group Therapy, Dance/Movement Therapy   Participation Level:  Active  Participation Quality:  Appropriate  Affect:  Appropriate  Cognitive:  Appropriate  Insight:  Limited  Engagement in Group:  Limited  Engagement in Therapy:  Limited  Modes of Intervention:  Clarification, Problem-solving, Role-play, Socialization and Support  Summary of Progress/Problems: Pt. participated in a group discussion in viewing of the movie My Name is Francisco Capuchin. Pt. spoke about themes of enabling, family dynamics of the addicted family, support systems, 12 step groups and the power of addiction. Pt. was encouraged to develop a plan on how to use 12 step programs and supports upon discharge.     Cassidi Long

## 2011-10-17 NOTE — Progress Notes (Signed)
Patient ID: JOANA NOLTON, female   DOB: 1934-10-23, 77 y.o.   MRN: [id number] Patient ID: FIORELLA HANAHAN, female   DOB: Oct 06, 1934, 77 y.o.   MRN: [id number]   Detox going well. Some shakes at times but it is less now. Sleep is good. No other new acute issues.  Mental Status Examination/Evaluation:  Objective: Appearance: Casual   Eye Contact:: Fair   Speech: Clear and Coherent   Volume: Normal   Mood: Euthymic   Affect: Congruent   Thought Process: Coherent   Orientation: Full   Thought Content: WDL   Suicidal Thoughts: No   Homicidal Thoughts: No   Memory: Recent; Fair   Judgement: Fair   Insight: Present   Psychomotor Activity: Normal   Concentration: Fair   Recall: Fair   Akathisia: No   Sleep: Number of Hours: 6.75    Laboratory/X-Ray  Psychological Evaluation(s)      Assessment:  AXIS I: Alcohol dependence  AXIS II: Deferred  AXIS III:  Past Medical History   Diagnosis  Date   .  HTN (hypertension)    .  Depression    .  Anxiety     AXIS IV: problems related to social environment  AXIS V: 51-60 moderate symptoms   Treatment Recommendations:   Continue current meds

## 2011-10-17 NOTE — Progress Notes (Signed)
Lying quietly in bed with eyes closed.  Slight unsteadiness of gait when up once to bathroom.  Fall precautions addressed.  Safety checks are in progress Q 15 minutes.

## 2011-10-18 NOTE — Progress Notes (Signed)
[facility] Group Notes:  (Counselor/Nursing/MHT/Case Management/Adjunct)  10/18/2011 5:14 PM  Type of Therapy:  Group Therapy at 11  Participation Level:  Active  Participation Quality:  Attentive and Sharing  Affect:  Appropriate  Cognitive:  Oriented  Insight:  Limited  Engagement in Group:  Good  Engagement in Therapy:  Limited  Modes of Intervention:  Clarification, Problem-solving and Support  Summary of Progress/Problems:  Group discussion focused on what patient's see as their own obstacles to recovery.  [employer] shares belief that not drinking will be difficult to deal with due to  "it is the first thing I think of in the morning and when I think of something that is what I do."  Others in group offered support and suggestions.  Alexandria Chen also shared about "My children, they are so noisy they be looking in my pantry and trash and all over the house I think they think I've been drinking and they may not trust me." Patient received support and encouragement not to lie to self as in "I can handle one beer" which really means "If I drink this beer this morning I'm going to be going back for more today and the next day and the next."   Clide Dales 10/18/2011, 5:14 PM  [facility] Group Notes:  (Counselor/Nursing/MHT/Case Management/Adjunct)  10/18/2011 5:20 PM  Type of Therapy:  Group Therapy  Participation Level:  Minimal  Participation Quality:  Attentive  Affect:  Appropriate  Cognitive:  Appropriate  Insight:  Limited  Engagement in Group:  Limited  Modes of Intervention:  Education, Socialization and Support  Summary of Progress/Problems: Patient attended group presentation on services and resources available at  [facility] Alexandria Chen was attentive and appropriate yet quiet  during session.     Clide Dales 10/18/2011, 5:20 PM

## 2011-10-18 NOTE — Progress Notes (Signed)
Patient ID: Alexandria Chen, female   DOB: 1934-07-17, 77 y.o.   MRN: [id number] 10-18-11 nursing shift note: D: RN and pt were present at treatment team this am with other staff. A: plan of care was discussed with pt regarding her f/u with dr. Lolly Mustache. Potential d/c will be on 10-29-11. The md was to continue the ativan taper and will re-examine the medication. R: pt stated she understood the plan of care.  On her inventory sheet she wrote she slept well, appetite improving, energy low, attention good with depression and hopelessness both at "0". W/d symptoms are tremors. Denies thought of suicide or self harm. Physical problems she listed dizziness. No pain. When she goes home she plans to " to take care of myself and my home". "Clean, cook, and take care of my children".  Her question was " when will I be discharged".  She doesn't not see any problems staying on meds after discharge.

## 2011-10-18 NOTE — Progress Notes (Signed)
Pt attended discharge planning group and actively participated.  Pt presents with calm mood and affect.  Pt denies having depression, anxiety and SI.  Pt reports feeling stable to d/c today.  Pt states that she misses her home and family.  Pt states that she feels a lot better after coming here.  Pt states that she plans to go back to her hobbies of cooking and spending more time with her family.  Pt will follow up with Dr. Lolly Mustache at [facility].  SW provided pt a printout of AA meetings in her area.  Safety planning and suicide prevention discussed.   No further needs voiced by pt at this time.    Reyes Ivan, LCSWA 10/18/2011  11:36 AM    Per State Regulation 482.30 This Chart was reviewed for medical necessity with respect to the patient's Admission/Duration of stay.   Carmina Miller  10/18/2011  Next Review Date:  10/20/11

## 2011-10-18 NOTE — [facility] Suicide Risk Assessment (Signed)
Suicide Risk Assessment  Discharge Assessment      Demographic factors:  Age 75 or older;Divorced or widowed;Unemployed (retired)  Current Mental Status Per Nursing Assessment: On Admission:   (Denies SI/HI) At Discharge: Pt denied any SI/HI/thoughts of self harm or acute psychiatric issues in treatment team with clinical, nursing and medical team present.  Current Mental Status Per Physician:  Patient seen and evaluated. Chart reviewed. Patient stated that her mood was "much better". Her affect was mood congruent and euthymic. She denied any current thoughts of self injurious behavior, suicidal ideation or homicidal ideation. There were no auditory or visual hallucinations, paranoia, delusional thought processes, or mania noted.  Thought process was linear and goal directed.  No psychomotor agitation or retardation was noted. Speech was normal rate, tone and volume. Eye contact was good. Judgment and insight are fair.  Patient has been up and engaged on the unit.  No acute safety concerns reported from team.    Loss Factors: Loss of significant relationship; husband died 3 yrs ago  Historical Factors: Denied hx w/d seizures or DTs; lives with grandson  Risk Reduction Factors: Positive therapeutic relationship;Positive social support;Living with another person, especially a relative;Sense of responsibility to family; motivated for Tx; enjoys cooking and family; willing to try outpt [facility] groups  Discharge Diagnoses:   AXIS I: Alcohol Dependence; HTN; Anxiety Disorder NOS; SIMD AXIS II:  Deferred AXIS III:   Past Medical History  Diagnosis Date  . HTN (hypertension)   . Depression   . Anxiety    AXIS IV: moderate AXIS V:  50  Cognitive Features That Contribute To Risk:  denied per pt.  Reported nl decline with age.  Suicide Risk: Patient is currently viewed as a low risk of harm to herself and others in light of her history and risk factors. There are no acute safety concerns on the  unit at this time.  Plan Of Care/Follow-up recommendations: Pt seen and evaluated in treatment team. Chart reviewed.  Pt stable for and requesting discharge. Pt contracting for safety and does not currently meet [place] involuntary commitment criteria for continued hospitalization against her will.  Mental health treatment, medication management and continued sobriety will mitigate against the potential increased risk of harm to self and/or others.  Discussed the importance of recovery further with pt, as well as, tools to move forward in a healthy & safe manner.  Pt agreeable with the plan.  Discussed with the team.  Please see orders, follow up appointments per AVS with Dr. Lolly Mustache and full discharge summary to be completed by physician extender.  Recommend follow up with AA.  Diet: Heart Healthy. Activity: As tolerated.     Alexandria Chen 10/18/2011, 10:59 PM

## 2011-10-18 NOTE — Progress Notes (Signed)
Sleeping at long intervals.  Exhibiting normal sleep behavior when observed during Q 15 minute safety checks.

## 2011-10-18 NOTE — Tx Team (Signed)
Interdisciplinary Treatment Plan Update (Adult)  Date:  10/18/2011  Time Reviewed:  10:32 AM   Progress in Treatment: Attending groups: Yes Participating in groups:  Yes Taking medication as prescribed: Yes Tolerating medication:  Yes Family/Significant othe contact made:   Patient understands diagnosis:  Yes Discussing patient identified problems/goals with staff:  Yes Medical problems stabilized or resolved:  Yes Denies suicidal/homicidal ideation: Yes Issues/concerns per patient self-inventory:  None identified Other: N/A  New problem(s) identified: None Identified  Reason for Continuation of Hospitalization: Anxiety Depression Medication stabilization Withdrawal symptoms  Interventions implemented related to continuation of hospitalization: mood stabilization, medication monitoring and adjustment, group therapy and psycho education, safety checks q 15 mins  Additional comments: N/A  Estimated length of stay: 1-2 days  Discharge Plan: Pt will follow up with Dr. Lolly Mustache at [facility] in [place] for medication management.    New goal(s): N/A  Review of initial/current patient goals per problem list:    1.  Goal(s): Address substance use  Met:  No  Target date: by discharge  As evidenced by: completing detox protocol and refer to appropriate treatment  2.  Goal (s): Reduce depressive and anxiety symptoms  Met:  Yes  Target date: by discharge  As evidenced by: Reducing depression from a 10 to a 3 as reported by pt.  Pt denies depression and anxiety.    3.  Goal(s): Eliminate SI  Met:  Yes  Target date: by discharge  As evidenced by: pt denies SI.   Attendees: Patient:  Alexandria Chen 10/18/2011 10:25 AM  Family:     Physician:  Lupe Carney, DO 10/18/2011 10:32 AM   Nursing: Isaac Laud, RN 10/18/2011 10:32 AM   Case Manager:  Reyes Ivan, LCSWA 10/18/2011  10:32 AM   Counselor:  Ronda Fairly, LCSWA 10/18/2011   10:32 AM   Other:  Richelle Ito, LCSW 10/18/2011 10:32 AM   Other:  Nestor Ramp, RN 10/18/2011 10:34 AM  Other:     Other:      Scribe for Treatment Team:   Reyes Ivan 10/18/2011 10:32 AM

## 2011-10-19 ENCOUNTER — Ambulatory Visit (HOSPITAL_COMMUNITY): Payer: Self-pay | Admitting: Psychiatry

## 2011-10-19 MED ORDER — NAPROXEN SODIUM 220 MG PO TABS: 220.0000 mg | ORAL_TABLET | Freq: Two times a day (BID) | ORAL | Status: DC

## 2011-10-19 MED ORDER — CEPHALEXIN 500 MG PO CAPS: 500.0000 mg | ORAL_CAPSULE | Freq: Four times a day (QID) | ORAL | Status: AC

## 2011-10-19 MED ORDER — ASPIRIN 81 MG PO TABS: 81.0000 mg | ORAL_TABLET | Freq: Every day | ORAL | Status: DC

## 2011-10-19 MED ORDER — BUSPIRONE HCL 10 MG PO TABS: 10.0000 mg | ORAL_TABLET | Freq: Three times a day (TID) | ORAL | Status: DC

## 2011-10-19 MED ORDER — FLUOXETINE HCL 40 MG PO CAPS: ORAL_CAPSULE | ORAL | Status: DC

## 2011-10-19 MED ORDER — GABAPENTIN 300 MG PO CAPS: 300.0000 mg | ORAL_CAPSULE | Freq: Three times a day (TID) | ORAL | Status: DC

## 2011-10-19 MED ORDER — LORAZEPAM 1 MG PO TABS: 1.0000 mg | ORAL_TABLET | Freq: Two times a day (BID) | ORAL | Status: DC

## 2011-10-19 MED ORDER — VITAMIN D3 25 MCG (1000 UT) PO CAPS: 1000.0000 [IU] | ORAL_CAPSULE | Freq: Every day | ORAL | Status: DC

## 2011-10-19 MED ORDER — ATORVASTATIN CALCIUM 40 MG PO TABS: 40.0000 mg | ORAL_TABLET | Freq: Every day | ORAL | Status: DC

## 2011-10-19 MED ORDER — FELODIPINE ER 10 MG PO TB24: 10.0000 mg | ORAL_TABLET | Freq: Every day | ORAL | Status: DC

## 2011-10-19 MED ORDER — OMEGA-3 FATTY ACIDS 1000 MG PO CAPS: 3.0000 g | ORAL_CAPSULE | Freq: Every day | ORAL | Status: AC

## 2011-10-19 MED ORDER — ATENOLOL 25 MG PO TABS: 25.0000 mg | ORAL_TABLET | Freq: Every day | ORAL | Status: DC

## 2011-10-19 NOTE — Progress Notes (Signed)
[facility] Group Notes:  (Counselor/Nursing/MHT/Case Management/Adjunct)  10/19/2011 5:35 PM  Type of Therapy:  Group Therapy at 11  Participation Level:  Minimal  Participation Quality:  Sharing  Affect:  Anxious  Cognitive:  Oriented  Insight:  Limited  Engagement in Group:  Limited  Engagement in Therapy:  None  Modes of Intervention:  Limit-setting, Problem-solving and Support  Summary of Progress/Problems:  Annalia Metzger shared that she was feeling "wonderful today and thrilled to be discharging."  During the short period of time Mikaelah Trostle was actually in group she spilt her coffee and others helped clean, she tried to use telephone and told facilitator the phone was broken.  It appeared that perhaps Nevaeh Korte was simply anxious to discharge and totally focused on discharge. Others in group were supportive and patient left group early.     Clide Dales 10/19/2011, 5:35 PM

## 2011-10-19 NOTE — Progress Notes (Signed)
[facility] Case Management Discharge Plan:  Will you be returning to the same living situation after discharge: Yes,  return home At discharge, do you have transportation home?:Yes,  family will pick pt up Do you have the ability to pay for your medications:Yes,  access to meds  Release of information consent forms completed and in the chart;  Patient's signature needed at discharge.  Patient to Follow up at:  Follow-up Information    Follow up with [facility] on 10/26/2011. (Appointment scheduled at 12:15 pm with Dr. Lolly Mustache)    Contact information:   [address] [place] [place], [place] [address] [phone]      Follow up with AA  meetings . (See brochure for meeting times and location)          Patient denies SI/HI:   Yes,  denies SI/HI    Safety Planning and Suicide Prevention discussed:  Yes,  discussed with pt  Barrier to discharge identified:No.  Summary and Recommendations: Pt attended discharge planning group and actively participated.  Pt presents with calm mood and affect.  Pt rates anxiety at a 3-4 today and denies having depression and SI.  Pt reports feeling stable to d/c today.  No recommendations from SW.  No further needs voiced by pt.  Pt stable to discharge.     Carmina Miller 10/19/2011, 9:56 AM

## 2011-10-19 NOTE — Progress Notes (Signed)
Patient ID: Alexandria Chen, female   DOB: Jul 08, 1934, 77 y.o.   MRN: [id number] Has been up and about and has been interacting with peers and staff.

## 2011-10-19 NOTE — Tx Team (Signed)
Interdisciplinary Treatment Plan Update (Adult)  Date:  10/19/2011  Time Reviewed:  9:53 AM   Progress in Treatment: Attending groups: Yes Participating in groups:  Yes Taking medication as prescribed: Yes Tolerating medication:  Yes Family/Significant othe contact made:  Yes Patient understands diagnosis:  Yes Discussing patient identified problems/goals with staff:  Yes Medical problems stabilized or resolved:  Yes Denies suicidal/homicidal ideation: Yes Issues/concerns per patient self-inventory:  None identified Other: N/A  New problem(s) identified: None Identified  Reason for Continuation of Hospitalization: Stable to d/c  Interventions implemented related to continuation of hospitalization: Stable to d/c  Additional comments: N/A  Estimated length of stay: D/C today   Discharge Plan:  Pt will follow up with Dr. Lolly Mustache for medication management.  Provided pt with AA meetings.    New goal(s): N/A  Review of initial/current patient goals per problem list:    1.  Goal(s): Address substance use  Met:  Yes  Target date: by discharge  As evidenced by: completed detox protocol and referred to appropriate treatment    Attendees: Patient:  Alexandria Chen 10/19/2011 9:55 AM   Family:     Physician:  Lupe Carney, DO 10/19/2011 9:53 AM   Nursing: Roswell Miners, RN 10/19/2011 9:53 AM   Case Manager:  Reyes Ivan, LCSWA 10/19/2011  9:53 AM   Counselor:  Ronda Fairly, LCSWA 10/19/2011  9:53 AM   Other:  Richelle Ito, LCSW 10/19/2011 9:53 AM   Other:     Other:     Other:      Scribe for Treatment Team:   Reyes Ivan 10/19/2011 9:53 AM

## 2011-10-20 ENCOUNTER — Telehealth (HOSPITAL_COMMUNITY): Payer: Self-pay | Admitting: *Deleted

## 2011-10-20 ENCOUNTER — Other Ambulatory Visit (HOSPITAL_COMMUNITY): Payer: Self-pay | Admitting: Psychiatry

## 2011-10-20 NOTE — Progress Notes (Signed)
Patient Discharge Instructions:  After Visit Summary (AVS):   Access to EMR:  10/20/2011 Psychiatric Admission Assessment Note:   Access to EMR:  10/20/2011 Suicide Risk Assessment - Discharge Assessment:   Access to EMR:  10/20/2011 Next Level Care Provider Has Access to the EMR, 10/20/2011  Records provided to [facility] - Dr. Lolly Mustache via [facility]/Epic access.  Wandra Scot, 10/20/2011, 5:20 PM

## 2011-10-24 NOTE — Discharge Summary (Signed)
Physician Discharge Summary Note  Patient:  Alexandria Chen is an 77 y.o., female MRN:  [id number] DOB:  12-Mar-1935 Patient phone:  [phone] (home)  Patient address:   [address] [place] [address],   Date of Admission:  10/14/2011 Date of Discharge: 10/18/2011  Reason for Admission: Relapse on Alcohol  Discharge Diagnoses: Principal Problem:  *Alcohol dependence   Axis Diagnosis:  Discharge Diagnoses:  AXIS I: Alcohol Dependence; HTN; Anxiety Disorder NOS; SIMD  AXIS II: Deferred  AXIS III:  Past Medical History   Diagnosis  Date   .  HTN (hypertension)    .  Depression    .  Anxiety     AXIS IV: moderate  AXIS V: 50  Level of Care:  Out patient  Hospital Course:  The patient was admitted for detox from alcohol after a relapse to drinking for the past one and a half months.  She was drinking several 40 oz beers a day,  The patient was continued on all of her home medications and an Ativan detox protocol was initiated due to her age and the need to decrease daytime sedation.        Alexandria Chen response to treatment was monitored by CIWA scores as well as daily evaluations by clinical providers.  Her response to treatment was also documented by her daily self assessments of her emotional and mental status. She was treated with Buspar for her anxiety, Vitamin D for her low vitamin D levels, prozac for her depression and anxiety, gabapentin for chronic pain and anxiety as well.  She was of course continued on her routine medications.       Alexandria Chen responded well to treatment and felt ready for discharge on the 3rd day.  She was discharged home with plans to follow up with her outpatient provider Dr. Lolly Mustache with whom she is well acquainted.  Consults:  none  Significant Diagnostic Studies:  none  Discharge Vitals:   Blood pressure 126/70, pulse 57, temperature 97.6 F (36.4 C), temperature source Oral, resp. rate 16, height 4\' 11"  (1.499 m), weight 63.05 kg (139  lb).  Mental Status Exam: See Mental Status Examination and Suicide Risk Assessment completed by Attending Physician prior to discharge.  Discharge destination:  Home  Is patient on multiple antipsychotic therapies at discharge:  No   Has Patient had three or more failed trials of antipsychotic monotherapy by history:  No Recommended Plan for Multiple Antipsychotic Therapies: not applicable   Discharge Orders    Future Orders Please Complete By Expires   Diet - low sodium heart healthy      Increase activity slowly      Discharge instructions      Comments:   Take all medication as prescribed.  Follow up with your provider and keep all scheduled appointments to make sure you get refills in time.     Medication List  As of 10/24/2011  5:59 PM   STOP taking these medications         meclizine 25 MG tablet         TAKE these medications      Indication    aspirin 81 MG tablet   Take 1 tablet (81 mg total) by mouth daily. For platelet aggregation.    Indication: Heart Attack      atenolol 25 MG tablet   Commonly known as: TENORMIN   Take 1 tablet (25 mg total) by mouth daily. For hypertension.  Indication: High Blood Pressure      atorvastatin 40 MG tablet   Commonly known as: LIPITOR   Take 1 tablet (40 mg total) by mouth daily. For hyperlipidemia    Indication: Type II A Hyperlipidemia      busPIRone 10 MG tablet   Commonly known as: BUSPAR   Take 1 tablet (10 mg total) by mouth 3 (three) times daily. For anxiety.    Indication: Anxiety Disorder      cephALEXin 500 MG capsule   Commonly known as: KEFLEX   Take 1 capsule (500 mg total) by mouth 4 (four) times daily. For infection.       felodipine 10 MG 24 hr tablet   Commonly known as: PLENDIL   Take 1 tablet (10 mg total) by mouth daily. For hypertension.    Indication: High Blood Pressure      fish oil-omega-3 fatty acids 1000 MG capsule   Take 3 capsules (3 g total) by mouth daily. For hyperlipidemia.        FLUoxetine 40 MG capsule   Commonly known as: PROZAC   Take one capsule daily (40mg ) by mouth for anxiety and depression.       gabapentin 300 MG capsule   Commonly known as: NEURONTIN   Take 1 capsule (300 mg total) by mouth 3 (three) times daily. For pain and anxeity.    Indication: Neuropathic Pain      LORazepam 1 MG tablet   Commonly known as: ATIVAN   Take 1 tablet (1 mg total) by mouth 2 (two) times daily. For anxiety.       naproxen sodium 220 MG tablet   Commonly known as: ANAPROX   Take 1 tablet (220 mg total) by mouth 2 (two) times daily with a meal. For pain.    Indication: Joint Damage causing Pain and Loss of Function      Vitamin D3 1000 UNITS Caps   Take 1 capsule (1,000 Units total) by mouth daily. For vitamin D deficiency.            Follow-up Information    Follow up with [facility] on 10/26/2011. (Appointment scheduled at 12:15 pm with Dr. Lolly Mustache)    Contact information:   [address] [place] [place], [place] [address] [phone]      Follow up with AA  meetings . (See brochure for meeting times and location)          Follow-up recommendations:  Heart healthy diet, activity as tolerated.  Comments:    Signed: Lloyd Huger T. Tanelle Lanzo PAC For Dr. Lupe Carney 10/24/2011, 5:59 PM

## 2011-10-26 ENCOUNTER — Ambulatory Visit (INDEPENDENT_AMBULATORY_CARE_PROVIDER_SITE_OTHER): Payer: Medicare Other | Admitting: Psychiatry

## 2011-10-26 ENCOUNTER — Encounter (HOSPITAL_COMMUNITY): Payer: Self-pay | Admitting: Psychiatry

## 2011-10-26 VITALS — BP 118/58 | HR 56 | Wt 142.0 lb

## 2011-10-26 DIAGNOSIS — F1010 Alcohol abuse, uncomplicated: Secondary | ICD-10-CM

## 2011-10-26 DIAGNOSIS — F411 Generalized anxiety disorder: Secondary | ICD-10-CM

## 2011-10-26 DIAGNOSIS — F329 Major depressive disorder, single episode, unspecified: Secondary | ICD-10-CM

## 2011-10-26 NOTE — Patient Instructions (Addendum)
Stopped taking Neurontin and Ativan.  Take Prozac 40 mg in the morning and BuSpar 10 mg 3 times a day.  If she continued to feel dizziness and have persistent urinary symptoms than please contact your her primary care physician.  Your taking 2 medication for blood pressure.  Check primary care physician if one can be stopped.  He her blood pressure is low .  Call us back if he has any question or concern about the medication or if you feel worsening of the symptoms.  I will see you again in 2 weeks.

## 2011-10-26 NOTE — Progress Notes (Signed)
Chief complaint I was recently discharged from behavioral Center.  I relapsed on alcohol.    History of present illness Patient is 77 year old [place] female who came with her daughter on her appointment.  Patient was recently discharged from behavioral [facility] however she had relapsed on alcohol.  She was drinking 40 ounce beer almost every day.  She was hiding from the family but one of her nephew find out and patient decided to go hospital for help.  Patient endorse increased anxiety and depression related to her 30 year old mother.  Patient endorse that her mother has been calling every day and accusing for stealing things.  She's been taking care of her mother and recently notice more anxious depressed and feeling burdened.  Patient also complained of dizziness tired and confused sometimes.  She brought her daughter who can help her medication instruction.  She brought a big bag of medication and don't know what she is taking.  At behavioral Center she was prescribed Neurontin to take 300 mg 3 times a day, Ativan 1 mg twice a day.  She's been noticing more dizzy and tired.  Her daughter explained that she stopped taking Neurontin due to fear that making her very sleepy and groggy.  Her other daughter called [place] and also concern about medication which was prescribed at behavioral Center.  The patient has some anxiety and nervousness due to family situation related to her mother but she is more concern about the medication side effects.  She has no scheduled for sleep.  She sleeps all day and sometime outlined.  She denies any agitation anger mood swing.  However she endorse no motivation to do things.  She denies any active or passive suicidal thoughts.  She denies any drinking since she released from the hospital.  There were no paranoia or delusion present.  She has some tremors however she appears more sedated.  Current psychiatric medication Prozac 40 mg daily BuSpar 10 mg 3 times a day   Neurontin 300 mg 3 times a day and Ativan 1 mg twice a day prescribed at behavioral [facility] however patient stopped taking this medication.  Past psychiatric history Patient was recently discharged from behavioral [facility] due to relapse into her alcohol.  She has significant history of depression and alcohol abuse.  She has at least 2 other psychiatric admission at behavioral [facility] which are exacerbated by alcohol intake.   Patient had a good response with Prozac and BuSpar.  She was taking benzodiazepine in the past however due to history of alcohol it was discontinued.  Patient denies any history of paranoia or psychosis.  Medical history Hypertension.  Patient sees Dr. Paulene Floor in [place].  At behavioral Center she has noticed urinary tract infection and given antibiotic however patient is not sure if she is taking.  Her lab results shows glucose 1.9 on 613.  Her WBC was normal.  Vital signs Blood pressure 184/58, pulse 57 and weight 142 pounds.  Psychosocial history Patient lives by herself however his grandson stays sometime with her.  Patient has 4 daughter and one son.  2 of her daughter lives close by.  Her mother also lives next to her home.    Alcohol and Substance use history The patient has significant history of drinking alcohol for more than 30 years.  She was recently discharged from the hospital due to relapse into alcohol.  Patient claims to be sober since she released from the hospital.  Mental status examination.  Patient is casually dressed.  She appears sedated and tired.  Her attention and concentration is distracted and poor.  Her thought process is also slow but logical.  She described her mood is anxious .  Her affect is mood appropriate.  She denies any active or passive suicidal thoughts or homicidal thoughts.  She denies any auditory or visual hallucination.  There were no flight of idea or loose association.  She has difficulty  concentrating and her thinking.  She did not remember very well about her medication.  There were no paranoia or delusion present at this time.  She's alert and oriented x3.  Her insight judgment and impulse control is okay.  Assessment.  Axis I Maj.  Depressive disorder , alcohol abuse.  anxiety disorder NOS  Axis II deferred Axis III hypertension  Axis IV moderate Axis V 60-65  Plan I reviewed psychosocial stressor, recent discharge summary, recent blood test and response to the medication.  I do believe patient experiencing medication side effects of Neurontin and Ativan.  She's been feeling more sedated confused.  I recommend to stop Ativan and Neurontin.  I also reminded to take antibiotic for her urinary tract infection and if condition persist then she should call her primary care physician.  She also taking 2 medication for blood pressure and that need to be addressed with her primary care physician.  Her blood pressure is low today.  I recommend to continue BuSpar and Prozac at present does.  At this time patient is not drinking however to remain sober.  I recommend to call us with the condition get worse or if she has any question about the medication.  I also recommend to call 911 or go to local emergency room if she has any suicidal thoughts or homicidal thoughts.  I will see her again in 3 weeks.  Time spent 30 minutes.  Written instruction is provided to the patient and her daughter about the medication and followup with the primary care physician.  Portion of this note is generated with voice recognition software and may contain typographical error.

## 2011-10-28 ENCOUNTER — Ambulatory Visit (HOSPITAL_COMMUNITY): Payer: Self-pay | Admitting: Psychiatry

## 2011-10-28 ENCOUNTER — Telehealth (HOSPITAL_COMMUNITY): Payer: Self-pay | Admitting: *Deleted

## 2011-10-31 ENCOUNTER — Other Ambulatory Visit (HOSPITAL_COMMUNITY): Payer: Self-pay | Admitting: Psychiatry

## 2011-10-31 DIAGNOSIS — F1010 Alcohol abuse, uncomplicated: Secondary | ICD-10-CM

## 2011-11-01 NOTE — Telephone Encounter (Signed)
Per pharmacist, last refill of Buspar was on 08/31/11. Printed RX that was given on 10/19/11 when d/c'd from hospital has never been brought in.  Will refill this request from surescript with note to pharmacy not to fill printed RX from 10/19/11 if pt brings it in.

## 2011-11-02 DIAGNOSIS — A599 Trichomoniasis, unspecified: Secondary | ICD-10-CM | POA: Diagnosis not present

## 2011-11-02 DIAGNOSIS — N390 Urinary tract infection, site not specified: Secondary | ICD-10-CM | POA: Diagnosis not present

## 2011-11-09 ENCOUNTER — Encounter (HOSPITAL_COMMUNITY): Payer: Self-pay | Admitting: Psychiatry

## 2011-11-09 ENCOUNTER — Ambulatory Visit (INDEPENDENT_AMBULATORY_CARE_PROVIDER_SITE_OTHER): Payer: Medicare Other | Admitting: Psychiatry

## 2011-11-09 VITALS — BP 125/72 | HR 56 | Wt 141.0 lb

## 2011-11-09 DIAGNOSIS — F1010 Alcohol abuse, uncomplicated: Secondary | ICD-10-CM

## 2011-11-09 DIAGNOSIS — F331 Major depressive disorder, recurrent, moderate: Secondary | ICD-10-CM

## 2011-11-09 MED ORDER — FLUOXETINE HCL 40 MG PO CAPS: ORAL_CAPSULE | ORAL | Status: DC

## 2011-11-09 MED ORDER — BUSPIRONE HCL 10 MG PO TABS: 10.0000 mg | ORAL_TABLET | Freq: Three times a day (TID) | ORAL | Status: DC

## 2011-11-09 NOTE — Progress Notes (Addendum)
Chief complaint I am doing better on the medication.  I am not drinking alcohol.      History of present illness Patient is 77 year old [place] female who came with her daughter on her followup appointment.   patient is feeling better since her medication has been reduced.  She denies any recent fall dizziness or excessive sedation.  She's been sleeping better however she continued to have some residual anxiety.  On her last visit we had stopped Ativan and Neurontin as patient was experiencing side effects.  Patient also contact her primary care physician who recommend to continue to blood pressure medication for better control.  Patient denies any recent agitation anger mood swing.  She continued to stress about her mother however recently there are more people who are helping her mother.  Patient admitted feeling less stressful since she is not very involved in taking care of her mother.  She sleeping better and denies any nightmare or crying spells.  She still has some guilt that she is not helping her mother however she realized that her mother can be very difficult at times to manage.  Overall she is improving.  She denies any side effects of medication.  Her memory concentration is improved since her medication a reduced.  She's compliant with the medication.  She's not drinking since she released from the hospital.  She's not using any illegal substance.   Current psychiatric medication BuSpar 10 mg 3 times a day. Prozac 40 mg daily.  Vitals Blood pressure 125/72, pulse 56 and weight 142 pound.   Past psychiatric history Patient was  ad[facility] to [facility] in [facility] due to relapse into her alcohol.  She has significant history of depression and alcohol abuse.  She has at least 2 other psychiatric admission at behavioral [facility] which are exacerbated by alcohol intake.   Patient had a good response with Prozac and BuSpar.  She was taking benzodiazepine in the past  however due to history of alcohol it was discontinued.  Patient denies any history of paranoia or psychosis.  Medical history Hypertension.  Patient sees Dr. Paulene Floor in [place].  At behavioral Center she has noticed urinary tract infection and given antibiotic however patient is not sure if she is taking.  Her lab results shows glucose 1.9 in June 2013 .  Her WBC was normal.  Psychosocial history Patient lives by herself however his grandson stays sometime with her.  Patient has 4 daughter and one son.  2 of her daughter lives close by.  Her mother also lives next to her home.    Alcohol and Substance use history The patient has significant history of drinking alcohol for more than 30 years.  She was recently discharged from the hospital due to relapse into alcohol.  Patient claims to be sober since she released from the hospital.    Mental status examination.  Patient is casually dressed.   she is pleasant and cooperative.  Her attention and concentration is improved from the past.  Her speech is relevant and coherent.  Her thought process is also improved.  She maintained good eye contact.  She denies any active or passive suicidal thoughts or homicidal thoughts.  She described her mood isgood and her affect is bright.  She denies any auditory or visual hallucination.  There were no flight of idea or loose association. Tere were no tremors or shakes present.  There were no paranoia or delusion present at this  time.  She's alert and oriented x3.  Her insight judgment and impulse control is okay.  Assessment.  Axis I Maj.  Depressive disorder , alcohol abuse.  anxiety disorder NOS  Axis II deferred Axis III hypertension  Axis IV moderate Axis V 60-65  Plan I reviewed psychosocial stressor,  response to the medication and previous progress note.  Patient is doing much better with adjustment of medication.  I recommend to continue BuSpar and Prozac at present dosage.  We talk  about reducing her cigarette smoking cessation as patient continued to smoke.  I recommend to call us if she is any question or concern about the medication or if she feels worsening of the symptoms.  Patient is not drinking since she released from the hospital.  I will see her again in 6 weeks.  Time spent 30 minutes.    Portion of this note is generated with voice recognition software and may contain typographical error.

## 2011-12-18 ENCOUNTER — Other Ambulatory Visit (HOSPITAL_COMMUNITY): Payer: Self-pay | Admitting: Psychiatry

## 2011-12-20 ENCOUNTER — Other Ambulatory Visit (HOSPITAL_COMMUNITY): Payer: Self-pay | Admitting: Psychiatry

## 2011-12-20 NOTE — Telephone Encounter (Signed)
New script given on 12/10/11 with one more refill. Too soon to refill

## 2011-12-21 ENCOUNTER — Encounter (HOSPITAL_COMMUNITY): Payer: Self-pay | Admitting: Psychiatry

## 2011-12-21 ENCOUNTER — Ambulatory Visit (INDEPENDENT_AMBULATORY_CARE_PROVIDER_SITE_OTHER): Payer: Medicare Other | Admitting: Psychiatry

## 2011-12-21 VITALS — BP 155/79 | HR 55 | Wt 141.8 lb

## 2011-12-21 DIAGNOSIS — F1010 Alcohol abuse, uncomplicated: Secondary | ICD-10-CM

## 2011-12-21 DIAGNOSIS — F411 Generalized anxiety disorder: Secondary | ICD-10-CM

## 2011-12-21 DIAGNOSIS — F329 Major depressive disorder, single episode, unspecified: Secondary | ICD-10-CM

## 2011-12-21 DIAGNOSIS — F331 Major depressive disorder, recurrent, moderate: Secondary | ICD-10-CM

## 2011-12-21 MED ORDER — BUSPIRONE HCL 10 MG PO TABS: 10.0000 mg | ORAL_TABLET | Freq: Three times a day (TID) | ORAL | Status: DC

## 2011-12-21 MED ORDER — FLUOXETINE HCL 40 MG PO CAPS: ORAL_CAPSULE | ORAL | Status: DC

## 2011-12-21 NOTE — Progress Notes (Signed)
Chief complaint I think I'm back to myself.        History of present illness Patient is 77 year old [place] American female who came for her followup appointment.  She usually comes with her daughter however her daughter was busy today.  Patient endorse much improvement since she is taking her medication regularly.  She sleeping better.  She still has some issues with her mother recently there has been no argument with her mother.  She is trying to keep distance from her mother.  She had a good relationship with her daughter.  She claimed sober from drinking for past 2 months.  She denies any recent panic attack or any nervousness.  She sleeping better.  She likes her current psychiatric medication.  She continued to smoke however she wanted to cut down in the future.  She is more involved in exercise and walking and able to distract her anxiety.  She is not seeing therapist and feels that she does not need at this time however she promised of her anxiety get worse she will resume counseling.  She denies any agitation anger mood swing.  She denies any side effects of medication.  Current psychiatric medication BuSpar 10 mg 3 times a day. Prozac 40 mg daily.  Past psychiatric history Patient was  admitted to behavioral [facility] in [facility] due to relapse into her alcohol.  She has significant history of depression and alcohol abuse.  She has at least 2 other psychiatric admission at behavioral [facility] which are exacerbated by alcohol intake.   Patient had a good response with Prozac and BuSpar.  She was taking benzodiazepine in the past however due to history of alcohol it was discontinued.  Patient denies any history of paranoia or psychosis.  Medical history Hypertension.  Patient sees Dr. Paulene Floor in [place].    Psychosocial history Patient lives by herself however his grandson stays sometime with her.  Patient has 4 daughter and one son.  2 of her daughter lives  close by.  Her mother also lives next to her home.    Alcohol and Substance use history The patient has significant history of drinking alcohol for more than 30 years.  She was recently discharged from the hospital due to relapse into alcohol.  Patient claims to be sober since she released from the hospital.    Mental status examination.  Patient is casually dressed.  She is pleasant cooperative and maintained good eye contact.  Her attention and concentration is better.  Her speech is fluent clear and coherent.  Her thought process is also logical and goal-directed.  She described her mood is anxious however her affect is bright and improved from the past.  She denies any active or passive suicidal thoughts or homicidal thoughts.  There were no paranoia or delusion present at this time.  There were no tremors or shakes present.  Her attention concentration is good.  She's alert oriented x3.  There were no flight of idea or loose association.  Her insight judgment and pulse control is better.  Assessment.  Axis I Maj.  Depressive disorder , alcohol abuse.  anxiety disorder NOS  Axis II deferred Axis III hypertension  Axis IV moderate Axis V 60-65  Plan I reviewed vitals, psychosocial stressor, last progress note and response to the medication.  Patient is doing much on her current psychiatric medication.  She does not want to see therapist at this time however agree to resume counseling but  she needed.  I will continue her current psychiatric medication and recommended to call us if she is any question or concern about the medication or if she feels worsening of the symptoms.  Time spent 30 minutes.  I will see her again in 2 months.  Portion of this note is generated with voice recognition software and may contain typographical error.

## 2011-12-29 DIAGNOSIS — S93429A Sprain of deltoid ligament of unspecified ankle, initial encounter: Secondary | ICD-10-CM | POA: Diagnosis not present

## 2012-01-17 DIAGNOSIS — J209 Acute bronchitis, unspecified: Secondary | ICD-10-CM | POA: Diagnosis not present

## 2012-01-17 DIAGNOSIS — M25519 Pain in unspecified shoulder: Secondary | ICD-10-CM | POA: Diagnosis not present

## 2012-01-30 ENCOUNTER — Other Ambulatory Visit (HOSPITAL_COMMUNITY): Payer: Self-pay | Admitting: Psychiatry

## 2012-01-30 NOTE — Telephone Encounter (Signed)
Given script on 12/21/11 with one more refill

## 2012-02-05 ENCOUNTER — Other Ambulatory Visit (HOSPITAL_COMMUNITY): Payer: Self-pay | Admitting: Psychiatry

## 2012-02-05 DIAGNOSIS — F331 Major depressive disorder, recurrent, moderate: Secondary | ICD-10-CM

## 2012-02-09 ENCOUNTER — Ambulatory Visit (INDEPENDENT_AMBULATORY_CARE_PROVIDER_SITE_OTHER): Payer: Medicare Other | Admitting: Psychiatry

## 2012-02-09 DIAGNOSIS — F331 Major depressive disorder, recurrent, moderate: Secondary | ICD-10-CM

## 2012-02-10 NOTE — Progress Notes (Signed)
Patient:  Alexandria Chen   DOB: 03-15-1935  MR Number: [id number]  Location: Behavioral Health Center:  [address] [place] [place],  [place], [address]  Start: Wednesday 02/09/2012 10:05 AM End: Wednesday 02/09/2012 10:50 AM  Provider/Observer:     Florencia Reasons, MSW, LCSW   Chief Complaint:      Chief Complaint  Patient presents with  . Anxiety  . Depression    Reason For Service:     The patient is a 77 year old African American female who has a long-standing history of recurrent periods of symptoms of depression and anxiety. Patient also has a history of alcohol abuse/dependence and was hospitalized in July 2013 due to relapse and drinking approximately a 6 pack of beer daily along with experiencing increased symptoms of depression and anxiety. This was patient's third psychiatric hospitalization. Patient is seen today for follow up appointment.  Interventions Strategy:  Supportive therapy,  Participation Level:   Active  Participation Quality:  Appropriate      Behavioral Observation:   Fidgety, talkative, cooperative  Current Psychosocial Factors: The patient's 64 year old mother has ongoing health issues and constantly complains per patient's report.  Content of Session:   Reviewing symptoms, identifying stressors, reinforcing patient's efforts to set and maintain boundaries with her mother, identifying ways to maintain consistency regarding self-care as well as involvement in activity  Current Status:   The patient reports improved mood, decreased anxiety, increased involvement in activity but continued worry about her mother.  Patient reports no alcohol use since her discharge from hospitalization in June 2013.  Patient Progress:   Good. The patient reports feeling much better since her recent hospitalization for detox and treatment of depression. She has maintained abstinence from alcohol use since hospitalization per patient's report. She has been more active and has resumed normal  interest in activities. She is performing household tasks and has been gardening. She also has state overnight with one of her daughters in [place] and reports enjoying the visit. She continues to worry about her mother who is negative and constantly complains per patient's report. However, patient has begun to disengage from her mother when they began to argue. Therapist works with patient to reinforce her efforts to set healthy boundaries with her mother. Therapist also works with patient to identify ways to maintain consistency regarding self-care as well as her involvement in activity.  Target Goals:  Improve mood, decrease anxiety and excessive worry, and resume normal interest in activities   Last Reviewed:     Goals Addressed Today:   Decrease anxiety and excessive worry   Impression/Diagnosis:   The patient has a long-standing history of recurrent periods of depression and anxiety.  Her symptoms have included depressed mood, social withdrawal, lack of interest in activities, poor motivation, excessive  worry, feeling jittery, and ruminating thoughts.  Patient also has a history of alcohol dependence and recently has relapsed. Diagnoses: Maj. depressive disorder, recurrent, moderate; generalized anxiety disorder, alcohol dependence  Diagnosis:  Axis I:  1. Major depressive disorder, recurrent, moderate             Axis II: No diagnosis

## 2012-02-10 NOTE — Patient Instructions (Addendum)
Discussed orally 

## 2012-02-17 ENCOUNTER — Encounter (HOSPITAL_COMMUNITY): Payer: Self-pay | Admitting: Psychiatry

## 2012-02-17 ENCOUNTER — Ambulatory Visit (INDEPENDENT_AMBULATORY_CARE_PROVIDER_SITE_OTHER): Payer: Medicare Other | Admitting: Psychiatry

## 2012-02-17 DIAGNOSIS — F1010 Alcohol abuse, uncomplicated: Secondary | ICD-10-CM

## 2012-02-17 DIAGNOSIS — F411 Generalized anxiety disorder: Secondary | ICD-10-CM

## 2012-02-17 DIAGNOSIS — F329 Major depressive disorder, single episode, unspecified: Secondary | ICD-10-CM

## 2012-02-17 DIAGNOSIS — F331 Major depressive disorder, recurrent, moderate: Secondary | ICD-10-CM

## 2012-02-17 MED ORDER — FLUOXETINE HCL 40 MG PO CAPS: ORAL_CAPSULE | ORAL | Status: DC

## 2012-02-17 MED ORDER — BUSPIRONE HCL 10 MG PO TABS
10.0000 mg | ORAL_TABLET | Freq: Three times a day (TID) | ORAL | Status: DC
Start: 2012-02-17 — End: 2012-05-19

## 2012-02-17 NOTE — Progress Notes (Signed)
Chief complaint   medication management and followup.        History of present illness Patient came for her followup appointment.  She is stable on her current psychiatric medication.  She denies any side effects.  She sleeping better.  She's not drinking and feel proud of it.  She is in close contact with her daughter who lives close by.  She denies any recent agitation anger mood swing.  She denies any crying spells.  She wants to continue her current psychiatric medication.  She denies any recent panic attack.  She scheduled to see her primary care physician in next 2 weeks.  She's not using any illegal substance.    Current psychiatric medication BuSpar 10 mg 3 times a day. Prozac 40 mg daily.  Past psychiatric history Patient was  admitted to behavioral [facility] in [facility] due to relapse into her alcohol.  She has significant history of depression and alcohol abuse.  She has at least 2 other psychiatric admission at behavioral [facility] which are exacerbated by alcohol intake.   Patient had a good response with Prozac and BuSpar.  She was taking benzodiazepine in the past however due to history of alcohol it was discontinued.  Patient denies any history of paranoia or psychosis.  Medical history Hypertension.  Patient sees Dr. Paulene Floor in [place].    Psychosocial history Patient lives by herself however his grandson stays sometime with her.  Patient has 4 daughter and one son.  2 of her daughter lives close by.      Alcohol and Substance use history The patient has significant history of drinking alcohol for more than 30 years.  She  has been admitted few times due to relapse into drinking and worsening of depression .  Patient claims to be sober since she released from the hospital.    Mental status examination.  Patient is casually dressed.  She is pleasant cooperative and maintained good eye contact.  Her attention and concentration is  good.  Her speech  is clear and coherent.  Her thought process is also logical and goal-directed.  She described her mood is  good and her affect is mood appropriate.  She denies any active or passive suicidal thoughts or homicidal thoughts.  There were no paranoia or delusion present at this time.  There were no tremors or shakes present.  Her attention concentration is good.  She's alert oriented x3.  There were no flight of idea or loose association.  Her insight judgment and pulse control is better.  Assessment.  Axis I Maj.  Depressive disorder , alcohol abuse.  anxiety disorder NOS  Axis II deferred Axis III hypertension  Axis IV moderate Axis V 60-65  Plan I   Will  her current psychiatric medication.  I explained side effects and benefits of the medication.  I also explained that she will see a new doctor on her next appointment since I am going full-time to [place] office.  I recommend to call us if she is any question or concern about the medication if she feels worsening of the symptom.  Followup in 3 months.  Portion of this note is generated with voice recognition software and may contain typographical error.

## 2012-04-05 ENCOUNTER — Ambulatory Visit (HOSPITAL_COMMUNITY): Payer: Self-pay | Admitting: Psychiatry

## 2012-05-18 DIAGNOSIS — I10 Essential (primary) hypertension: Secondary | ICD-10-CM | POA: Diagnosis not present

## 2012-05-18 DIAGNOSIS — E785 Hyperlipidemia, unspecified: Secondary | ICD-10-CM | POA: Diagnosis not present

## 2012-05-19 ENCOUNTER — Ambulatory Visit (INDEPENDENT_AMBULATORY_CARE_PROVIDER_SITE_OTHER): Payer: Medicare Other | Admitting: Psychiatry

## 2012-05-19 ENCOUNTER — Encounter (HOSPITAL_COMMUNITY): Payer: Self-pay | Admitting: Psychiatry

## 2012-05-19 VITALS — Wt 142.0 lb

## 2012-05-19 DIAGNOSIS — F32A Depression, unspecified: Secondary | ICD-10-CM

## 2012-05-19 DIAGNOSIS — F419 Anxiety disorder, unspecified: Secondary | ICD-10-CM

## 2012-05-19 DIAGNOSIS — F1010 Alcohol abuse, uncomplicated: Secondary | ICD-10-CM

## 2012-05-19 DIAGNOSIS — F331 Major depressive disorder, recurrent, moderate: Secondary | ICD-10-CM

## 2012-05-19 DIAGNOSIS — F329 Major depressive disorder, single episode, unspecified: Secondary | ICD-10-CM

## 2012-05-19 DIAGNOSIS — F411 Generalized anxiety disorder: Secondary | ICD-10-CM

## 2012-05-19 MED ORDER — FLUOXETINE HCL 40 MG PO CAPS: ORAL_CAPSULE | ORAL | Status: DC

## 2012-05-19 MED ORDER — BUSPIRONE HCL 10 MG PO TABS: 10.0000 mg | ORAL_TABLET | Freq: Three times a day (TID) | ORAL | Status: DC

## 2012-05-19 NOTE — Progress Notes (Signed)
[facility] [address] Progress Note Alexandria Chen MRN: [id number] DOB: 1934/07/21 Age: 78 y.o.  Date: 05/19/2012 Start Time: 10:31 AM End Time: 10:47 AM  Chief Complaint: Chief Complaint  Patient presents with  . Depression  . Follow-up  . Establish Care  . Medication Refill   Subjective: "I'm doing okay". Depression 4 or 5/10 and Anxiety 5 or 6/10, where 1 is the best and 10 is the worst.   History of present illness Patient came for her followup appointment.  Pt reports that she is compliant with the psychotropic medications with good most of the time and fair some of the time benefit and no noticeable side effects.  Pt joking in the office setting when discussing that it is easier to stop heroin than nicotine she purposed that maybe she should start using heroin to help her get off the nicotine.  Current psychiatric medication BuSpar 10 mg 3 times a day. Prozac 40 mg daily.  Past psychiatric history Patient was  [facility] due to relapse into her alcohol.  She has significant history of depression and alcohol abuse.  She has at least 2 other psychiatric admission at behavioral [facility] which are exacerbated by alcohol intake.   Patient had a good response with Prozac and BuSpar.  She was taking benzodiazepine in the past however due to history of alcohol it was discontinued.  Patient denies any history of paranoia or psychosis.  Medical history Hypertension.  Patient sees Dr. Paulene Floor in [place].    Psychosocial history Patient lives by herself however his grandson stays sometime with her.  Patient has 4 daughter and one son.  2 of her daughter lives close by.      Alcohol and Substance use history The patient has significant history of drinking alcohol for more than 30 years.  She  has been admitted few times due to relapse into drinking and worsening of depression .  Patient claims to be sober since she  released from the hospital.    Family History: family history includes Alcohol abuse in her brother and father; Anxiety disorder in her mother; and Dementia in her mother.  There is no history of ADD / ADHD, and Drug abuse, and Bipolar disorder, and Depression, and OCD, and Paranoid behavior, and Schizophrenia, and Seizures, and Sexual abuse, and Physical abuse, .  Mental status examination.  Patient is casually dressed.  She is pleasant cooperative and maintained good eye contact.  Her attention and concentration is  good.  Her speech is clear and coherent.  Her thought process is also logical and goal-directed.  She described her mood is  good and her affect is mood appropriate.  She denies any active or passive suicidal thoughts or homicidal thoughts.  There were no paranoia or delusion present at this time.  There were no tremors or shakes present.  Her attention concentration is good.  She's alert oriented x3.  There were no flight of idea or loose association.  Her insight judgment and pulse control is better.  Assessment.  Axis I Maj.  Depressive disorder , alcohol abuse.  anxiety disorder NOS  Axis II deferred Axis III hypertension  Axis IV moderate Axis V 60-65  Plan/Discussion/Summary: I took her vitals.  I reviewed CC, tobacco/med/surg Hx, meds effects/ side effects, problem list, therapies and responses as well as current situation/symptoms discussed options. See orders and pt instructions for more details.  Orson Aloe, MD, [facility]

## 2012-05-19 NOTE — Patient Instructions (Signed)
Keep it up.  Try getting into some regular exercise a day.  Walking just 8 minutes a day at first is a start.  Getting into the habit is the best thing.  Keep building on that to about 25 minutes a day and keep a record and bring that in the next time.  Call if problems or concerns.

## 2012-05-20 ENCOUNTER — Other Ambulatory Visit (HOSPITAL_COMMUNITY): Payer: Self-pay | Admitting: Physician Assistant

## 2012-05-23 DIAGNOSIS — Z23 Encounter for immunization: Secondary | ICD-10-CM | POA: Diagnosis not present

## 2012-08-10 ENCOUNTER — Other Ambulatory Visit: Payer: Self-pay | Admitting: *Deleted

## 2012-08-10 MED ORDER — ATORVASTATIN CALCIUM 40 MG PO TABS: 40.0000 mg | ORAL_TABLET | Freq: Every day | ORAL | Status: DC

## 2012-08-11 ENCOUNTER — Ambulatory Visit (INDEPENDENT_AMBULATORY_CARE_PROVIDER_SITE_OTHER): Payer: Medicare Other | Admitting: Psychiatry

## 2012-08-11 DIAGNOSIS — F331 Major depressive disorder, recurrent, moderate: Secondary | ICD-10-CM | POA: Diagnosis not present

## 2012-08-11 NOTE — Patient Instructions (Signed)
Discussed orally 

## 2012-08-11 NOTE — Progress Notes (Signed)
Patient:  Alexandria Chen   DOB: December 30, 1934  MR Number: [id number]  Location: Behavioral Health Center:  [address] [place],  [place], [address]  Start: Friday 08/11/2012 10:00 AM End: Friday 08/11/2012 10:50 AM  Provider/Observer:     Florencia Reasons, MSW, LCSW   Chief Complaint:      Chief Complaint  Patient presents with  . Depression  . Anxiety    Reason For Service:     The patient is a 78 year old African American female who has a long-standing history of recurrent periods of symptoms of depression and anxiety. Patient also has a history of alcohol abuse/dependence and was hospitalized in July 2013 due to relapse and drinking approximately a 6 pack of beer daily along with experiencing increased symptoms of depression and anxiety. This was patient's third psychiatric hospitalization. Patient is seen today for follow up appointment.  Interventions Strategy:  Supportive therapy,  Participation Level:   Active  Participation Quality:  Appropriate      Behavioral Observation:   Fidgety, talkative, cooperative, anxious  Current Psychosocial Factors: The patient's 68 year old mother has ongoing health issues and constantly complains per patient's report, patient's son and daughter had a recent altercation and aren't speaking to each other, patient's ago grandson who resides with patient is irritable and does not clean his room  Content of Session:   Reviewing symptoms, processing feelings, identifying ways to set and maintain boundaries, reviewing relaxation techniques, encouraging patient to resume self-care efforts and increase activity, identifying ways to use support system  Current Status:   The patient reports depressed mood, increased anxiety, decreased involvement in activity, excessive worry, poor motivation, sleep difficulty (only for 5 hours of sleep per night), and fatigue.  Patient reports no alcohol use since her discharge from hospitalization in June 2013.  Patient  Progress:   Fair. The patient reports increased stress during the past several months and include ongoing concerns about her mother who continues to constantly complain per patient's report. Patient reports increased worry about mother and is having difficulty setting boundaries. She also worries about 2 of her children who had a recent altercation. Patient worries as they are not speaking to each other. Patient expresses frustration regarding her adult grandson as he is irritable and does not clean his room. She expresses frustration with self as she does not feel doing anything. She states being very jittery at night. Patient is scheduled to see Dr. Dan Humphreys for medication management on 08/17/2012. Therapist encourages patient to resume self-care efforts regarding physical activity and exercise. Patient has begun gardening and states plans to join the local senior citizens exercise program. Therapist and patient also identify ways to use support system. Patient is considering visiting one of her daughters overnight.  Target Goals:  Improve mood, decrease anxiety and excessive worry, and resume normal interest in activities   Last Reviewed:     Goals Addressed Today:   Decrease anxiety and excessive worry   Impression/Diagnosis:   The patient has a long-standing history of recurrent periods of depression and anxiety.  Her symptoms have included depressed mood, social withdrawal, lack of interest in activities, poor motivation, excessive  worry, feeling jittery, and ruminating thoughts.  Patient also has a history of alcohol dependence and recently has relapsed. Diagnoses: Maj. depressive disorder, recurrent, moderate; generalized anxiety disorder, alcohol dependence  Diagnosis:  Axis I:  Major depressive disorder, recurrent, moderate          Axis II: No diagnosis

## 2012-08-17 ENCOUNTER — Encounter (HOSPITAL_COMMUNITY): Payer: Self-pay | Admitting: Psychiatry

## 2012-08-17 ENCOUNTER — Ambulatory Visit (INDEPENDENT_AMBULATORY_CARE_PROVIDER_SITE_OTHER): Payer: Medicare Other | Admitting: Psychiatry

## 2012-08-17 VITALS — Wt 139.0 lb

## 2012-08-17 DIAGNOSIS — F329 Major depressive disorder, single episode, unspecified: Secondary | ICD-10-CM

## 2012-08-17 DIAGNOSIS — F331 Major depressive disorder, recurrent, moderate: Secondary | ICD-10-CM

## 2012-08-17 DIAGNOSIS — F419 Anxiety disorder, unspecified: Secondary | ICD-10-CM

## 2012-08-17 DIAGNOSIS — F1021 Alcohol dependence, in remission: Secondary | ICD-10-CM

## 2012-08-17 DIAGNOSIS — F1010 Alcohol abuse, uncomplicated: Secondary | ICD-10-CM

## 2012-08-17 DIAGNOSIS — F411 Generalized anxiety disorder: Secondary | ICD-10-CM

## 2012-08-17 MED ORDER — FLUOXETINE HCL 40 MG PO CAPS: ORAL_CAPSULE | ORAL | Status: DC

## 2012-08-17 MED ORDER — GABAPENTIN 100 MG PO CAPS: 100.0000 mg | ORAL_CAPSULE | Freq: Four times a day (QID) | ORAL | Status: DC

## 2012-08-17 MED ORDER — BUSPIRONE HCL 15 MG PO TABS: 15.0000 mg | ORAL_TABLET | Freq: Four times a day (QID) | ORAL | Status: DC

## 2012-08-17 NOTE — Patient Instructions (Signed)
Relaxation is the ultimate solution for you.  You can seek it through tub baths, bubble baths, essential oils or incense, walking or chatting with friends, listening to soft music, watching a candle burn and just letting all thoughts go and appreciating the true essence of the Creator.  Pets or animals may be very helpful.  You might spend some time with them and then go do more directed meditation.  Set a timer for 8 minutes and walk for that amount of time in the house or in the yard.  Mark "8" on a calendar for that day.  Do that every day this week.  Then next week increase the time to 9 minutes and then mark the calendar with a 9 for that day.  Each week increase your exercise by one minute.  Keep a record of this so you can see what progress you are making.  Do this every day, just like eating and sleeping.  It is good for pain control, depression, and for your soul/spirit.  Bring the record in for your next visit so we can talk about your effort and how you feel with the new exercise program going and working for you.  Take care of yourself.  No one else is standing up to do the job and only you know what you need.   GET SERIOUS about taking care of yourself.  Do the next right thing and that often means doing something to care for yourself along the lines of are you hungry, are you angry, are you lonely, are you tired, are you scared?  [employer] is what that stands for.  Call if problems or concerns.

## 2012-08-17 NOTE — Progress Notes (Signed)
[facility] Behavior[facility] [address] Progress Note Alexandria Chen MRN: [id number] DOB: August 26, 1934 Age: 78 y.o.  Date: 08/17/2012 Start Time: 10:15 AM End Time: 10:30 AM  Chief Complaint: Chief Complaint  Patient presents with  . Depression  . Follow-up  . Medication Refill   Subjective: "I'm doing okay". Depression 5 or 6/10 and Anxiety 5 or 6/10, where 1 is the best and 10 is the worst. Pain is 0/10  History of present illness Patient came for her followup appointment.  Pt reports that she is compliant with the psychotropic medications with fair benefit and no noticeable side effects.  She stays jittery all the time.  She wakes up 0400 every day.  She has been discussing that some with Peggy.  It sounds like some anxiety or trauma issues.  She is purposing switching to Heroin to help her get off the Nicotine.  Instead I purpose the below.  Discussed adding higher dose of BuSpar and adding Neurontin for anxiety management.  Current psychiatric medication BuSpar 10 mg 3 times a day. Prozac 40 mg daily.  Past psychiatric history Patient was  ad[facility] to [facility] in [facility] due to relapse into her alcohol.  She has significant history of depression and alcohol abuse.  She has at least 2 other psychiatric admission at behavioral [facility] which are exacerbated by alcohol intake.   Patient had a good response with Prozac and BuSpar.  She was taking benzodiazepine in the past however due to history of alcohol it was discontinued.  Patient denies any history of paranoia or psychosis.  Medical history Hypertension.  Patient sees Dr. Paulene Floor in [place].    Psychosocial history Patient lives by herself however his grandson stays sometime with her.  Patient has 4 daughter and one son.  2 of her daughter lives close by.      Alcohol and Substance use history The patient has significant history of drinking alcohol for more than 30 years.  She  has been admitted  few times due to relapse into drinking and worsening of depression .  Patient claims to be sober since she released from the hospital.    Family History: family history includes Alcohol abuse in her brother and father; Anxiety disorder in her mother; and Dementia in her mother.  There is no history of ADD / ADHD, and Drug abuse, and Bipolar disorder, and Depression, and OCD, and Paranoid behavior, and Schizophrenia, and Seizures, and Sexual abuse, and Physical abuse, .  Mental status examination.  Patient is casually dressed.  She is pleasant cooperative and maintained good eye contact.  Her attention and concentration is  good.  Her speech is clear and coherent.  Her thought process is also logical and goal-directed.  She described her mood is  good and her affect is mood appropriate.  She denies any active or passive suicidal thoughts or homicidal thoughts.  There were no paranoia or delusion present at this time.  There were no tremors or shakes present.  Her attention concentration is good.  She's alert oriented x3.  There were no flight of idea or loose association.  Her insight judgment and pulse control is better.  Lab Results:  Results for orders placed during the hospital encounter of 10/14/11 (from the past 8736 hour(s))  CBC   Collection Time    10/14/11 11:24 AM      Result Value Range   WBC 5.9  4.0 - 10.5 K/uL   RBC 4.86  3.87 - 5.11 MIL/uL   Hemoglobin 14.9  12.0 - 15.0 g/dL   HCT 16.1  09.6 - 04.5 %   MCV 88.7  78.0 - 100.0 fL   MCH 30.7  26.0 - 34.0 pg   MCHC 34.6  30.0 - 36.0 g/dL   RDW 40.9  81.1 - 91.4 %   Platelets 336  150 - 400 K/uL  DIFFERENTIAL   Collection Time    10/14/11 11:24 AM      Result Value Range   Neutrophils Relative 60  43 - 77 %   Neutro Abs 3.5  1.7 - 7.7 K/uL   Lymphocytes Relative 30  12 - 46 %   Lymphs Abs 1.8  0.7 - 4.0 K/uL   Monocytes Relative 10  3 - 12 %   Monocytes Absolute 0.6  0.1 - 1.0 K/uL   Eosinophils Relative 0  0 - 5 %    Eosinophils Absolute 0.0  0.0 - 0.7 K/uL   Basophils Relative 0  0 - 1 %   Basophils Absolute 0.0  0.0 - 0.1 K/uL  BASIC METABOLIC PANEL   Collection Time    10/14/11 11:24 AM      Result Value Range   Sodium 136  135 - 145 mEq/L   Potassium 4.3  3.5 - 5.1 mEq/L   Chloride 98  96 - 112 mEq/L   CO2 27  19 - 32 mEq/L   Glucose, Bld 129 (*) 70 - 99 mg/dL   BUN 8  6 - 23 mg/dL   Creatinine, Ser 7.82  0.50 - 1.10 mg/dL   Calcium 95.6  8.4 - 21.3 mg/dL   GFR calc non Af Amer 67 (*) >90 mL/min   GFR calc Af Amer 78 (*) >90 mL/min  ETHANOL   Collection Time    10/14/11 11:24 AM      Result Value Range   Alcohol, Ethyl (B) <11  0 - 11 mg/dL  URINE CULTURE   Collection Time    10/14/11 11:32 AM      Result Value Range   Specimen Description URINE, CLEAN CATCH     Special Requests NONE     Culture  Setup Time [id number]     Colony Count >=100,000 COLONIES/ML     Culture ESCHERICHIA COLI     Report Status 10/16/2011 FINAL     Organism ID, Bacteria ESCHERICHIA COLI    URINALYSIS, ROUTINE W REFLEX MICROSCOPIC   Collection Time    10/14/11 11:32 AM      Result Value Range   Color, Urine YELLOW  YELLOW   APPearance CLEAR  CLEAR   Specific Gravity, Urine 1.005  1.005 - 1.030   pH 6.0  5.0 - 8.0   Glucose, UA NEGATIVE  NEGATIVE mg/dL   Hgb urine dipstick TRACE (*) NEGATIVE   Bilirubin Urine NEGATIVE  NEGATIVE   Ketones, ur NEGATIVE  NEGATIVE mg/dL   Protein, ur NEGATIVE  NEGATIVE mg/dL   Urobilinogen, UA 0.2  0.0 - 1.0 mg/dL   Nitrite POSITIVE (*) NEGATIVE   Leukocytes, UA LARGE (*) NEGATIVE  URINE MICROSCOPIC-ADD ON   Collection Time    10/14/11 11:32 AM      Result Value Range   WBC, UA TOO NUMEROUS TO COUNT  <3 WBC/hpf   RBC / HPF 3-6  <3 RBC/hpf   Bacteria, UA MANY (*) RARE  URINE RAPID DRUG SCREEN (HOSP PERFORMED)   Collection Time    10/14/11 11:33 AM  Result Value Range   Opiates NONE DETECTED  NONE DETECTED   Cocaine NONE DETECTED  NONE DETECTED    Benzodiazepines NONE DETECTED  NONE DETECTED   Amphetamines NONE DETECTED  NONE DETECTED   Tetrahydrocannabinol NONE DETECTED  NONE DETECTED   Barbiturates NONE DETECTED  NONE DETECTED  PCP draws routine labs and nothing is emerging as of concern.  Assessment.  Axis I Maj.  Depressive disorder , alcohol abuse.  anxiety disorder NOS  Axis II deferred Axis III hypertension  Axis IV moderate Axis V 60-65  Plan/Discussion: I took her vitals.  I reviewed CC, tobacco/med/surg Hx, meds effects/ side effects, problem list, therapies and responses as well as current situation/symptoms discussed options. Continue Prozac, increase BuSpar and add Neurontin for anxiety.  Recc walking and relaxation. See orders and pt instructions for more details.  MEDICATIONS this encounter: Meds ordered this encounter  Medications  . FLUoxetine (PROZAC) 40 MG capsule    Sig: Take one capsule daily (40mg ) by mouth for anxiety and depression.    Dispense:  30 capsule    Refill:  1  . busPIRone (BUSPAR) 15 MG tablet    Sig: Take 1 tablet (15 mg total) by mouth 4 (four) times daily.    Dispense:  120 tablet    Refill:  1  . gabapentin (NEURONTIN) 100 MG capsule    Sig: Take 1 capsule (100 mg total) by mouth 4 (four) times daily.    Dispense:  120 capsule    Refill:  1   Medical Decision Making Problem Points:  Established problem, stable/improving (1), Established problem, worsening (2), Review of last therapy session (1) and Review of psycho-social stressors (1) Data Points:  Review or order clinical lab tests (1) Review of medication regiment & side effects (2) Review of new medications or change in dosage (2)  I certify that outpatient services furnished can reasonably be expected to improve the patient's condition.   Orson Aloe, MD, [facility]

## 2012-08-23 ENCOUNTER — Other Ambulatory Visit: Payer: Self-pay

## 2012-08-23 ENCOUNTER — Encounter: Payer: Self-pay | Admitting: *Deleted

## 2012-08-23 MED ORDER — FELODIPINE ER 10 MG PO TB24: 10.0000 mg | ORAL_TABLET | Freq: Every day | ORAL | Status: DC

## 2012-08-23 NOTE — Telephone Encounter (Signed)
This encounter was created in error - please disregard.

## 2012-09-06 ENCOUNTER — Ambulatory Visit (INDEPENDENT_AMBULATORY_CARE_PROVIDER_SITE_OTHER): Payer: Medicare Other | Admitting: Psychiatry

## 2012-09-06 DIAGNOSIS — F411 Generalized anxiety disorder: Secondary | ICD-10-CM

## 2012-09-06 DIAGNOSIS — F331 Major depressive disorder, recurrent, moderate: Secondary | ICD-10-CM | POA: Diagnosis not present

## 2012-09-06 NOTE — Progress Notes (Signed)
Patient:  Alexandria Chen   DOB: 26-Jul-1934  MR Number: [id number]  Location: Behavioral Health Center:  [address] [place] [place],  [place], [address]  Start: Wednesday 09/06/2012 10:00 AM End: Wednesday 09/06/2012 10:50 AM  Provider/Observer:     Florencia Reasons, MSW, LCSW   Chief Complaint:      Chief Complaint  Patient presents with  . Anxiety  . Depression    Reason For Service:     The patient is a 78 year old African American female who has a long-standing history of recurrent periods of symptoms of depression and anxiety. Patient also has a history of alcohol abuse/dependence and was hospitalized in July 2013 due to relapse and drinking approximately a 6 pack of beer daily along with experiencing increased symptoms of depression and anxiety. This was patient's third psychiatric hospitalization. Patient is seen today for follow up appointment.  Interventions Strategy:  Supportive therapy, cognitive behavioral therapy  Participation Level:   Active  Participation Quality:  Appropriate      Behavioral Observation:   Fidgety, talkative, cooperative, anxious  Current Psychosocial Factors: The patient's 4 year old mother has ongoing health issues and constantly complains per patient's report.  Content of Session:   Reviewing symptoms, processing feelings, identifying ways to set and maintain boundaries, reviewing relaxation techniques, reinforcing efforts to improve self-care and increase involvement in activity.  Current Status:   The patient reports less depressed mood ( 5 on 10 point scale), decreased anxiety ( 5 on 10 point scale), increased involvement in activity, decreased worry, increased motivation, and improved sleep pattern ( 7-8 hours of sleep per night).  Patient reports no alcohol use since her discharge from hospitalization in June 2013.  Patient Progress:   Fair. The patient reports feeling better since last session. She has increased involvement in activity including walking  for 15 minutes daily, working in her garden, and visiting friends. She is looking forward to attending her granddaughter's college graduation this Friday. She reports no longer worrying about discord between herself daughter and son as she states having to take care of self and being unable to do anything about their relationship. She also expresses less frustration regarding her grandson's failure to clean his room  as he is gong frequently and she closes the door to his room. She continues to express frustration regarding her mother. However, patient has begun walking and hanging up the phone when her arm mother becomes argumentative and negative. Therapist works with patient to process her feelings about her relationship with her mother and reinforces her efforts to set and maintain boundaries. Therapist also works with patient to practice diaphragmatic breathing.  Target Goals:  Improve mood, decrease anxiety and excessive worry, and resume normal interest in activities   Last Reviewed:     Goals Addressed Today:   Decrease anxiety and excessive worry   Impression/Diagnosis:   The patient has a long-standing history of recurrent periods of depression and anxiety.  Her symptoms have included depressed mood, social withdrawal, lack of interest in activities, poor motivation, excessive  worry, feeling jittery, and ruminating thoughts.  Patient also has a history of alcohol dependence and recently has relapsed. Diagnoses: Maj. depressive disorder, recurrent, moderate; generalized anxiety disorder, alcohol dependence  Diagnosis:  Axis I:  Major depressive disorder, recurrent, moderate  Generalized anxiety disorder          Axis II: No diagnosis

## 2012-09-06 NOTE — Patient Instructions (Signed)
Discussed orally 

## 2012-09-07 ENCOUNTER — Other Ambulatory Visit: Payer: Self-pay | Admitting: Nurse Practitioner

## 2012-09-26 ENCOUNTER — Encounter: Payer: Self-pay | Admitting: Family Medicine

## 2012-09-26 ENCOUNTER — Ambulatory Visit (INDEPENDENT_AMBULATORY_CARE_PROVIDER_SITE_OTHER): Payer: Medicare Other | Admitting: Family Medicine

## 2012-09-26 ENCOUNTER — Telehealth: Payer: Self-pay | Admitting: Nurse Practitioner

## 2012-09-26 VITALS — BP 133/75 | HR 76 | Temp 97.5°F | Ht 62.0 in | Wt 138.2 lb

## 2012-09-26 DIAGNOSIS — H6123 Impacted cerumen, bilateral: Secondary | ICD-10-CM

## 2012-09-26 DIAGNOSIS — H8309 Labyrinthitis, unspecified ear: Secondary | ICD-10-CM | POA: Diagnosis not present

## 2012-09-26 DIAGNOSIS — H6120 Impacted cerumen, unspecified ear: Secondary | ICD-10-CM

## 2012-09-26 DIAGNOSIS — R42 Dizziness and giddiness: Secondary | ICD-10-CM | POA: Diagnosis not present

## 2012-09-26 DIAGNOSIS — H8303 Labyrinthitis, bilateral: Secondary | ICD-10-CM

## 2012-09-26 NOTE — Progress Notes (Signed)
  Subjective:    Patient ID: Alexandria Chen, female    DOB: April 15, 1935, 78 y.o.   MRN: [id number]  HPI Patient comes in this morning complaining with problems with both ears. I feel stopped up as she is having slight pain left worse than right. She denies sore throat fever cough and has only had minimal nasal congestion. This has been going on for one to 2 weeks.   Review of Systems  HENT: Positive for ear pain and congestion. Negative for nosebleeds, sore throat, rhinorrhea, trouble swallowing, neck pain, neck stiffness, tinnitus and ear discharge.   Respiratory: Negative for cough, shortness of breath and wheezing.   Cardiovascular: Positive for chest pain.  Gastrointestinal: Negative.   Genitourinary: Negative.   Musculoskeletal: Negative.   Neurological: Positive for dizziness (occasional) and light-headedness. Negative for headaches.       Objective:   Physical Exam  Nursing note and vitals reviewed. Constitutional: She is oriented to person, place, and time. She appears well-developed and well-nourished. No distress.  HENT:  Head: Normocephalic and atraumatic.  Nose: Nose normal.  Mouth/Throat: Oropharynx is clear and moist.  Ear cerumen bilaterally. A curette was used to remove a large amount of cerumen from the right ear canal. The left ear canal had more cerumen and will need to be irrigated.  Eyes: Conjunctivae are normal. Right eye exhibits no discharge. Left eye exhibits no discharge. Scleral icterus is present.  Neck: Normal range of motion. Neck supple. No thyromegaly present.  Cardiovascular: Normal rate, regular rhythm and normal heart sounds.  Exam reveals no gallop and no friction rub.   No murmur heard. Pulmonary/Chest: Effort normal and breath sounds normal. No respiratory distress. She has no wheezes. She has no rales.  Lymphadenopathy:    She has no cervical adenopathy.  Neurological: She is alert and oriented to person, place, and time.  Skin: Skin is warm and  dry.  Psychiatric: She has a normal mood and affect. Her behavior is normal. Judgment and thought content normal.          Assessment & Plan:  1. Excessive cerumen in ear canal, bilateral Ear cerumen removed with curet and irrigation  2. Dizziness and giddiness Heart and lungs were normal  3. Labyrinthitis, bilateral Removing earwax should help this  Patient Instructions  Use Debrox over-the-counter periodically to soften ear wax Drink plenty of fluid Be careful not to fall, remove throw rugs, keep nightlights on

## 2012-09-26 NOTE — Telephone Encounter (Signed)
appt made

## 2012-09-26 NOTE — Progress Notes (Signed)
  Subjective:    Patient ID: Alexandria Chen, female    DOB: [birth year], 78 y.o.   MRN: [id number]  HPI    Review of Systems  Constitutional: Negative for fever.  HENT: Positive for ear pain (bilateral,L > R) and postnasal drip (slight). Negative for sore throat and tinnitus.   Neurological: Positive for headaches (slight).       Objective:   Physical Exam        Assessment & Plan:

## 2012-09-26 NOTE — Patient Instructions (Signed)
Use Debrox over-the-counter periodically to soften ear wax Drink plenty of fluid Be careful not to fall, remove throw rugs, keep nightlights on

## 2012-09-28 ENCOUNTER — Ambulatory Visit (HOSPITAL_COMMUNITY): Payer: Self-pay | Admitting: Psychiatry

## 2012-10-17 ENCOUNTER — Ambulatory Visit (INDEPENDENT_AMBULATORY_CARE_PROVIDER_SITE_OTHER): Payer: Medicare Other | Admitting: Psychiatry

## 2012-10-17 ENCOUNTER — Encounter (HOSPITAL_COMMUNITY): Payer: Self-pay | Admitting: Psychiatry

## 2012-10-17 VITALS — BP 128/78 | Ht 61.25 in | Wt 137.6 lb

## 2012-10-17 DIAGNOSIS — F419 Anxiety disorder, unspecified: Secondary | ICD-10-CM

## 2012-10-17 DIAGNOSIS — F331 Major depressive disorder, recurrent, moderate: Secondary | ICD-10-CM

## 2012-10-17 DIAGNOSIS — F411 Generalized anxiety disorder: Secondary | ICD-10-CM

## 2012-10-17 DIAGNOSIS — F1010 Alcohol abuse, uncomplicated: Secondary | ICD-10-CM

## 2012-10-17 DIAGNOSIS — F329 Major depressive disorder, single episode, unspecified: Secondary | ICD-10-CM

## 2012-10-17 DIAGNOSIS — F1021 Alcohol dependence, in remission: Secondary | ICD-10-CM

## 2012-10-17 MED ORDER — BUSPIRONE HCL 15 MG PO TABS: 15.0000 mg | ORAL_TABLET | Freq: Four times a day (QID) | ORAL | Status: DC

## 2012-10-17 MED ORDER — FLUOXETINE HCL 40 MG PO CAPS: ORAL_CAPSULE | ORAL | Status: DC

## 2012-10-17 MED ORDER — GABAPENTIN 100 MG PO CAPS: 100.0000 mg | ORAL_CAPSULE | Freq: Four times a day (QID) | ORAL | Status: DC

## 2012-10-17 NOTE — Progress Notes (Signed)
[facility] [id number] Progress Note KATEE WENTLAND MRN: [id number] DOB: 31-May-1934 Age: 78 y.o.  Date: 10/17/2012 Start Time: 10:40 AM End Time: 10:59 AM  Chief Complaint: Chief Complaint  Patient presents with  . Depression  . Follow-up  . Medication Refill   Subjective: "I have a family reunion in [place] on the Fourth of July.  That new medicine really helps me with the anxiety much better". Depression 5/10 and Anxiety 5/10, where 1 is the best and 10 is the worst. Pain is 3 or 4/10 with her back.  History of present illness Patient came for her followup appointment.  Pt reports that she is compliant with the psychotropic medications with good benefit and no noticeable side effects.  The Neurontin has really helped her anxiety.  She is not noting much benefit for the whole body aches.  The BuSapr is really helping the nerves too. The adding higher dose of BuSpar and adding Neurontin for anxiety management has really helped her.  Current psychiatric medication BuSpar 15 mg 4 times a day Neurontin 100 mg four times a day Prozac 40 mg daily.  LIFE STYLE CHANGE: Walking now up to 30 minutes a day. Having a garden again this year.  Vitals: BP 128/78  Ht 5' 1.25" (1.556 m)  Wt 137 lb 9.6 oz (62.415 kg)  BMI 25.78 kg/m2  Past psychiatric history Patient [facility]  [facility] due to relapse into her alcohol.  She has significant history of depression and alcohol abuse.  She has at least 2 other psychiatric admission at behavioral [facility] which are exacerbated by alcohol intake.   Patient had a good response with Prozac and BuSpar.  She was taking benzodiazepine in the past however due to history of alcohol it was discontinued.  Patient denies any history of paranoia or psychosis.  Allergies: No Known Allergies Medical History: Past Medical History  Diagnosis Date  . HTN (hypertension)   . Depression   . Anxiety   Hypertension.   Patient sees Dr. Paulene Floor in [place].    Surgical History: History reviewed. No pertinent past surgical history. Family History: family history includes Alcohol abuse in her brother and father; Anxiety disorder in her mother; and Dementia in her mother.  There is no history of ADD / ADHD, and Drug abuse, and Bipolar disorder, and Depression, and OCD, and Paranoid behavior, and Schizophrenia, and Seizures, and Sexual abuse, and Physical abuse, . Reviewed and no changes noted today.  Psychosocial history Patient lives by herself however his grandson stays sometime with her.  Patient has 4 daughter and one son.  2 of her daughter lives close by.      Alcohol and Substance use history The patient has significant history of drinking alcohol for more than 30 years.  She  has been admitted few times due to relapse into drinking and worsening of depression .  Patient claims to be sober since she released from the hospital.    Mental status examination.  Patient is casually dressed.  She is pleasant cooperative and maintained good eye contact.  Her attention and concentration is  good.  Her speech is clear and coherent.  Her thought process is also logical and goal-directed.  She described her mood is  good and her affect is mood appropriate.  She denies any active or passive suicidal thoughts or homicidal thoughts.  There were no paranoia or delusion present at this time.  There were  no tremors or shakes present.  Her attention concentration is good.  She's alert oriented x3.  There were no flight of idea or loose association.  Her insight judgment and pulse control is better.  Lab Results:  No results found for this or any previous visit (from the past 8736 hour(s)).PCP draws routine labs and nothing is emerging as of concern.  Assessment.  Axis I Maj.  Depressive disorder , alcohol abuse.  anxiety disorder NOS  Axis II deferred Axis III hypertension  Axis IV moderate Axis V  60-65  Plan/Discussion: I took her vitals.  I reviewed CC, tobacco/med/surg Hx, meds effects/ side effects, problem list, therapies and responses as well as current situation/symptoms discussed options. Continue current effective medications. See orders and pt instructions for more details.  MEDICATIONS this encounter: No orders of the defined types were placed in this encounter.   Medical Decision Making Problem Points:  Established problem, stable/improving (1), Review of last therapy session (1) and Review of psycho-social stressors (1) Data Points:  Review or order clinical lab tests (1) Review of medication regiment & side effects (2)  I certify that outpatient services furnished can reasonably be expected to improve the patient's condition.   Orson Aloe, MD, [facility]

## 2012-10-17 NOTE — Patient Instructions (Signed)
Keep the walking and the gardening up.  That will help the pain and the anxiety.  Call if problems or concerns.

## 2012-11-09 ENCOUNTER — Other Ambulatory Visit: Payer: Self-pay | Admitting: Nurse Practitioner

## 2012-11-22 ENCOUNTER — Other Ambulatory Visit: Payer: Self-pay | Admitting: Family Medicine

## 2012-11-27 ENCOUNTER — Encounter: Payer: Self-pay | Admitting: General Practice

## 2012-11-27 ENCOUNTER — Ambulatory Visit (INDEPENDENT_AMBULATORY_CARE_PROVIDER_SITE_OTHER): Payer: Medicare Other | Admitting: General Practice

## 2012-11-27 ENCOUNTER — Telehealth: Payer: Self-pay | Admitting: Nurse Practitioner

## 2012-11-27 VITALS — BP 123/71 | HR 55 | Temp 97.4°F | Ht 61.25 in | Wt 138.0 lb

## 2012-11-27 DIAGNOSIS — I10 Essential (primary) hypertension: Secondary | ICD-10-CM | POA: Diagnosis not present

## 2012-11-27 DIAGNOSIS — H9193 Unspecified hearing loss, bilateral: Secondary | ICD-10-CM

## 2012-11-27 DIAGNOSIS — E785 Hyperlipidemia, unspecified: Secondary | ICD-10-CM

## 2012-11-27 DIAGNOSIS — H8110 Benign paroxysmal vertigo, unspecified ear: Secondary | ICD-10-CM | POA: Diagnosis not present

## 2012-11-27 DIAGNOSIS — H9190 Unspecified hearing loss, unspecified ear: Secondary | ICD-10-CM | POA: Diagnosis not present

## 2012-11-27 DIAGNOSIS — E559 Vitamin D deficiency, unspecified: Secondary | ICD-10-CM | POA: Diagnosis not present

## 2012-11-27 LAB — POCT CBC
Hemoglobin: 13 g/dL (ref 12.2–16.2)
Lymph, poc: 2.6 (ref 0.6–3.4)
MCH, POC: 29 pg (ref 27–31.2)
MCHC: 33.4 g/dL (ref 31.8–35.4)
MPV: 7.9 fL (ref 0–99.8)
POC LYMPH PERCENT: 43.8 %L (ref 10–50)
Platelet Count, POC: 287 10*3/uL (ref 142–424)

## 2012-11-27 MED ORDER — MECLIZINE HCL 25 MG PO TABS: 12.5000 mg | ORAL_TABLET | Freq: Two times a day (BID) | ORAL | Status: DC | PRN

## 2012-11-27 MED ORDER — MECLIZINE HCL 25 MG PO TABS: 25.0000 mg | ORAL_TABLET | Freq: Three times a day (TID) | ORAL | Status: DC | PRN

## 2012-11-27 NOTE — Telephone Encounter (Signed)
APPT MADE

## 2012-11-27 NOTE — Addendum Note (Signed)
Addended by: Orma Render F on: 11/27/2012 05:39 PM   Modules accepted: Orders

## 2012-11-27 NOTE — Patient Instructions (Addendum)
Fall Prevention and Home Safety Falls cause injuries and can affect all age groups. It is possible to use preventive measures to significantly decrease the likelihood of falls. There are many simple measures which can make your home safer and prevent falls. OUTDOORS  Repair cracks and edges of walkways and driveways.  Remove high doorway thresholds.  Trim shrubbery on the main path into your home.  Have good outside lighting.  Clear walkways of tools, rocks, debris, and clutter.  Check that handrails are not broken and are securely fastened. Both sides of steps should have handrails.  Have leaves, snow, and ice cleared regularly.  Use sand or salt on walkways during winter months.  In the garage, clean up grease or oil spills. BATHROOM  Install night lights.  Install grab bars by the toilet and in the tub and shower.  Use non-skid mats or decals in the tub or shower.  Place a plastic non-slip stool in the shower to sit on, if needed.  Keep floors dry and clean up all water on the floor immediately.  Remove soap buildup in the tub or shower on a regular basis.  Secure bath mats with non-slip, double-sided rug tape.  Remove throw rugs and tripping hazards from the floors. BEDROOMS  Install night lights.  Make sure a bedside light is easy to reach.  Do not use oversized bedding.  Keep a telephone by your bedside.  Have a firm chair with side arms to use for getting dressed.  Remove throw rugs and tripping hazards from the floor. KITCHEN  Keep handles on pots and pans turned toward the center of the stove. Use back burners when possible.  Clean up spills quickly and allow time for drying.  Avoid walking on wet floors.  Avoid hot utensils and knives.  Position shelves so they are not too high or low.  Place commonly used objects within easy reach.  If necessary, use a sturdy step stool with a grab bar when reaching.  Keep electrical cables out of the  way.  Do not use floor polish or wax that makes floors slippery. If you must use wax, use non-skid floor wax.  Remove throw rugs and tripping hazards from the floor. STAIRWAYS  Never leave objects on stairs.  Place handrails on both sides of stairways and use them. Fix any loose handrails. Make sure handrails on both sides of the stairways are as long as the stairs.  Check carpeting to make sure it is firmly attached along stairs. Make repairs to worn or loose carpet promptly.  Avoid placing throw rugs at the top or bottom of stairways, or properly secure the rug with carpet tape to prevent slippage. Get rid of throw rugs, if possible.  Have an electrician put in a light switch at the top and bottom of the stairs. OTHER FALL PREVENTION TIPS  Wear low-heel or rubber-soled shoes that are supportive and fit well. Wear closed toe shoes.  When using a stepladder, make sure it is fully opened and both spreaders are firmly locked. Do not climb a closed stepladder.  Add color or contrast paint or tape to grab bars and handrails in your home. Place contrasting color strips on first and last steps.  Learn and use mobility aids as needed. Install an electrical emergency response system.  Turn on lights to avoid dark areas. Replace light bulbs that burn out immediately. Get light switches that glow.  Arrange furniture to create clear pathways. Keep furniture in the same place.    Firmly attach carpet with non-skid or double-sided tape. °· Eliminate uneven floor surfaces. °· Select a carpet pattern that does not visually hide the edge of steps. °· Be aware of all pets. °OTHER HOME SAFETY TIPS °· Set the water temperature for 120° F (48.8° C). °· Keep emergency numbers on or near the telephone. °· Keep smoke detectors on every level of the home and near sleeping areas. °Document Released: 04/09/2002 Document Revised: 10/19/2011 Document Reviewed: 07/09/2011 °ExitCare® Patient Information ©2014  [employer]. ° °Vertigo °Vertigo means you feel like you or your surroundings are moving when they are not. Vertigo can be dangerous if it occurs when you are at work, driving, or performing difficult activities.  °CAUSES  °Vertigo occurs when there is a conflict of signals sent to your brain from the visual and sensory systems in your body. There are many different causes of vertigo, including: °· Infections, especially in the inner ear. °· A bad reaction to a drug or misuse of alcohol and medicines. °· Withdrawal from drugs or alcohol. °· Rapidly changing positions, such as lying down or rolling over in bed. °· A migraine headache. °· Decreased blood flow to the brain. °· Increased pressure in the brain from a head injury, infection, tumor, or bleeding. °SYMPTOMS  °You may feel as though the world is spinning around or you are falling to the ground. Because your balance is upset, vertigo can cause nausea and vomiting. You may have involuntary eye movements (nystagmus). °DIAGNOSIS  °Vertigo is usually diagnosed by physical exam. If the cause of your vertigo is unknown, your caregiver may perform imaging tests, such as an MRI scan (magnetic resonance imaging). °TREATMENT  °Most cases of vertigo resolve on their own, without treatment. Depending on the cause, your caregiver may prescribe certain medicines. If your vertigo is related to body position issues, your caregiver may recommend movements or procedures to correct the problem. In rare cases, if your vertigo is caused by certain inner ear problems, you may need surgery. °HOME CARE INSTRUCTIONS  °· Follow your caregiver's instructions. °· Avoid driving. °· Avoid operating heavy machinery. °· Avoid performing any tasks that would be dangerous to you or others during a vertigo episode. °· Tell your caregiver if you notice that certain medicines seem to be causing your vertigo. Some of the medicines used to treat vertigo episodes can actually make them worse in  some people. °SEEK IMMEDIATE MEDICAL CARE IF:  °· Your medicines do not relieve your vertigo or are making it worse. °· You develop problems with talking, walking, weakness, or using your arms, hands, or legs. °· You develop severe headaches. °· Your nausea or vomiting continues or gets worse. °· You develop visual changes. °· A family member notices behavioral changes. °· Your condition gets worse. °MAKE SURE YOU: °· Understand these instructions. °· Will watch your condition. °· Will get help right away if you are not doing well or get worse. °Document Released: 01/27/2005 Document Revised: 07/12/2011 Document Reviewed: 11/05/2010 °ExitCare® Patient Information ©2014 [employer]. ° °

## 2012-11-27 NOTE — Progress Notes (Signed)
  Subjective:    Patient ID: Alexandria Chen, female    DOB: 1934-09-18, 78 y.o.   MRN: [id number]  HPI Patient presents today with complaints of dizziness and unsteady gait at times. She is accompanied by her daughter. She reports onset was several months ago. She reports a history of ear infections and vertigo. She reports her hearing has been worsen for past few years and would like to see an audiologist.     Review of Systems  Constitutional: Negative for fever and chills.  HENT: Negative for ear pain.   Eyes: Negative for photophobia and visual disturbance.  Respiratory: Negative for chest tightness and shortness of breath.   Cardiovascular: Negative for chest pain and palpitations.  Neurological: Positive for dizziness. Negative for weakness and headaches.       Objective:   Physical Exam  Constitutional: She is oriented to person, place, and time. She appears well-developed and well-nourished.  HENT:  Head: Normocephalic and atraumatic.  Right Ear: External ear normal.  Mouth/Throat: Oropharynx is clear and moist.  Cerumen noted to left ear canal  Eyes: EOM are normal. Pupils are equal, round, and reactive to light.  Neck: Normal range of motion. Neck supple.  Cardiovascular: Normal rate, regular rhythm and normal heart sounds.   Pulmonary/Chest: Effort normal.  Diminished breath sounds throughout  Neurological: She is alert and oriented to person, place, and time.  Skin: Skin is warm and dry.  Psychiatric: She has a normal mood and affect.          Assessment & Plan:  1. Benign paroxysmal positional vertigo - meclizine (ANTIVERT) 25 MG tablet; Take 0.5 tablets (12.5 mg total) by mouth 2 (two) times daily as needed.  Dispense: 20 tablet; Refill: 0 -sedation effect discussed with patient and daughter -referral made to audiologist -RTO if symptoms worsen or unresolved and in one week for follow up -Patient and daughter verbalized understanding -Coralie Keens,  FNP-C

## 2012-11-29 LAB — NMR, LIPOPROFILE
HDL Cholesterol by NMR: 35 mg/dL — ABNORMAL LOW (ref >=40)
LDL Size: 20.9 nm (ref >20.5)
LP-IR Score: 31 (ref <=45)
Small LDL Particle Number: 508 nmol/L (ref <=527)

## 2012-11-29 LAB — CMP14+EGFR
ALT: 10 IU/L (ref 0–32)
AST: 13 IU/L (ref 0–40)
Albumin/Globulin Ratio: 1.4 (ref 1.1–2.5)
Alkaline Phosphatase: 92 IU/L (ref 39–117)
Calcium: 9.8 mg/dL (ref 8.6–10.2)
GFR calc non Af Amer: 51 mL/min/{1.73_m2} — ABNORMAL LOW (ref >59)
Potassium: 4.1 mmol/L (ref 3.5–5.2)
Sodium: 141 mmol/L (ref 134–144)

## 2012-11-29 LAB — VITAMIN D 25 HYDROXY (VIT D DEFICIENCY, FRACTURES): Vit D, 25-Hydroxy: 35.3 ng/mL (ref 30.0–100.0)

## 2012-12-04 ENCOUNTER — Ambulatory Visit: Payer: Medicare Other | Admitting: General Practice

## 2012-12-19 ENCOUNTER — Other Ambulatory Visit: Payer: Self-pay | Admitting: Nurse Practitioner

## 2012-12-23 ENCOUNTER — Other Ambulatory Visit: Payer: Self-pay | Admitting: Nurse Practitioner

## 2013-01-11 DIAGNOSIS — H903 Sensorineural hearing loss, bilateral: Secondary | ICD-10-CM | POA: Diagnosis not present

## 2013-01-15 ENCOUNTER — Other Ambulatory Visit: Payer: Self-pay

## 2013-01-15 DIAGNOSIS — H8110 Benign paroxysmal vertigo, unspecified ear: Secondary | ICD-10-CM

## 2013-01-15 MED ORDER — MECLIZINE HCL 25 MG PO TABS: 12.5000 mg | ORAL_TABLET | Freq: Two times a day (BID) | ORAL | Status: DC | PRN

## 2013-01-17 ENCOUNTER — Encounter (HOSPITAL_COMMUNITY): Payer: Self-pay | Admitting: Psychiatry

## 2013-01-17 ENCOUNTER — Ambulatory Visit (HOSPITAL_COMMUNITY): Payer: Self-pay | Admitting: Psychiatry

## 2013-01-17 ENCOUNTER — Ambulatory Visit (INDEPENDENT_AMBULATORY_CARE_PROVIDER_SITE_OTHER): Payer: Medicare Other | Admitting: Psychiatry

## 2013-01-17 VITALS — BP 180/84 | Ht 61.0 in | Wt 143.0 lb

## 2013-01-17 DIAGNOSIS — F329 Major depressive disorder, single episode, unspecified: Secondary | ICD-10-CM

## 2013-01-17 DIAGNOSIS — F331 Major depressive disorder, recurrent, moderate: Secondary | ICD-10-CM

## 2013-01-17 DIAGNOSIS — F1010 Alcohol abuse, uncomplicated: Secondary | ICD-10-CM

## 2013-01-17 DIAGNOSIS — F411 Generalized anxiety disorder: Secondary | ICD-10-CM

## 2013-01-17 DIAGNOSIS — F419 Anxiety disorder, unspecified: Secondary | ICD-10-CM

## 2013-01-17 MED ORDER — GABAPENTIN 100 MG PO CAPS: 100.0000 mg | ORAL_CAPSULE | Freq: Four times a day (QID) | ORAL | Status: DC

## 2013-01-17 MED ORDER — BUSPIRONE HCL 15 MG PO TABS: 15.0000 mg | ORAL_TABLET | Freq: Four times a day (QID) | ORAL | Status: DC

## 2013-01-17 MED ORDER — FLUOXETINE HCL 40 MG PO CAPS: ORAL_CAPSULE | ORAL | Status: DC

## 2013-01-17 NOTE — Progress Notes (Signed)
Patient ID: Alexandria Chen, female   DOB: 1935/04/07, 78 y.o.   MRN: [id number] [facility] [address] Progress Note HERMA UBALLE MRN: [id number] DOB: Dec 06, 1934 Age: 78 y.o.  Date: 01/17/2013 Start Time: 10:40 AM End Time: 10:59 AM  Chief Complaint: Chief Complaint  Patient presents with  . Anxiety  . Depression  . Follow-up  . Medication Refill   Subjective: "I'm doing well."  This patient is a 78 year old widowed black female who lives with her 62 year old grandson in [place]. She is a retired Therapist, sports.  The patient has had a long history of depression anxiety. She's always been an anxious person and thinks she inherited this trait from her mother. She used to abuse alcohol but had stopped for several years until a relapse last year. She was hospitalized in 2013 for alcohol relapse and has not used since. Her 71 year old mother lives across the street and calls her constantly and this makes her nervous. She does state that the addition of Neurontin and BuSpar helped considerably with her anxiety. In general her mood is good, her energy is good and she is sleeping well. She denies any suicidal ideation  Current psychiatric medication BuSpar 15 mg 4 times a day Neurontin 100 mg four times a day Prozac 40 mg daily.  LIFE STYLE CHANGE: Walking now up to 30 minutes a day. Having a garden again this year.  Vitals: BP 180/84  Ht 5\' 1"  (1.549 m)  Wt 143 lb (64.864 kg)  BMI 27.03 kg/m2  Past psychiatric history Patient was  [facility] due to relapse into her alcohol.  She has significant history of depression and alcohol abuse.  She has at least 2 other psychiatric admission at behavioral [facility] which are exacerbated by alcohol intake.   Patient had a good response with Prozac and BuSpar.  She was taking benzodiazepine in the past however due to history of alcohol it was discontinued.  Patient denies any history of paranoia or  psychosis.  Allergies: No Known Allergies Medical History: Past Medical History  Diagnosis Date  . HTN (hypertension)   . Depression   . Anxiety   . Alcohol abuse, in remission   Hypertension.  Patient sees Dr. Paulene Floor in [place].    Surgical History: History reviewed. No pertinent past surgical history. Family History: family history includes Alcohol abuse in her brother and father; Anxiety disorder in her mother; Dementia in her mother. There is no history of ADD / ADHD, Drug abuse, Bipolar disorder, Depression, OCD, Paranoid behavior, Schizophrenia, Seizures, Sexual abuse, or Physical abuse. Reviewed and no changes noted today.  Psychosocial history Patient lives by herself however his grandson stays sometime with her.  Patient has 4 daughter and one son.  2 of her daughter lives close by.      Alcohol and Substance use history The patient has significant history of drinking alcohol for more than 30 years.  She  has been admitted few times due to relapse into drinking and worsening of depression .  Patient claims to be sober since she released from the hospital.    Mental status examination.  Patient is casually dressed.  She is pleasant cooperative and maintained good eye contact.  Her attention and concentration is  good.  Her speech is clear and coherent.  Her thought process is also logical and goal-directed.  She described her mood is  good and her affect is mood appropriate.  She denies  any active or passive suicidal thoughts or homicidal thoughts.  There were no paranoia or delusion present at this time.  There were no tremors or shakes present.  Her attention concentration is good.  She's alert oriented x3.  There were no flight of idea or loose association.  Her insight judgment and pulse control is better.  Lab Results:  Results for orders placed in visit on 11/27/12 (from the past 8736 hour(s))  CMP14+EGFR   Collection Time    11/27/12  5:39 PM       Result Value Range   Glucose 88  65 - 99 mg/dL   BUN 18  8 - 27 mg/dL   Creatinine, Ser 0.98 (*) 0.57 - 1.00 mg/dL   GFR calc non Af Amer 51 (*) >59 mL/min/1.73   GFR calc Af Amer 58 (*) >59 mL/min/1.73   BUN/Creatinine Ratio 17  11 - 26   Sodium 141  134 - 144 mmol/L   Potassium 4.1  3.5 - 5.2 mmol/L   Chloride 103  97 - 108 mmol/L   CO2 23  18 - 29 mmol/L   Calcium 9.8  8.6 - 10.2 mg/dL   Total Protein 7.1  6.0 - 8.5 g/dL   Albumin 4.1  3.5 - 4.8 g/dL   Globulin, Total 3.0  1.5 - 4.5 g/dL   Albumin/Globulin Ratio 1.4  1.1 - 2.5   Total Bilirubin 0.4  0.0 - 1.2 mg/dL   Alkaline Phosphatase 92  39 - 117 IU/L   AST 13  0 - 40 IU/L   ALT 10  0 - 32 IU/L  NMR, LIPOPROFILE   Collection Time    11/27/12  5:39 PM      Result Value Range   LDL Particle Number [id number] (*) <1000 nmol/L   LDLC SERPL CALC-MCNC 90  <100 mg/dL   HDL Cholesterol by NMR 35 (*) >=40 mg/dL   Triglycerides by NMR 94  <150 mg/dL   Cholesterol 119  <147 mg/dL   HDL Particle Number 82.9 (*) >=30.5 umol/L   Small LDL Particle Number [id number]  <=527 nmol/L   LDL Size 20.9  >20.5 nm   LP-IR Score 31  <=45  VITAMIN D 25 HYDROXY   Collection Time    11/27/12  5:39 PM      Result Value Range   Vit D, 25-Hydroxy 35.3  30.0 - 100.0 ng/mL  POCT CBC   Collection Time    11/27/12  7:10 PM      Result Value Range   WBC 5.9  4.6 - 10.2 K/uL   Lymph, poc 2.6  0.6 - 3.4   POC LYMPH PERCENT 43.8  10 - 50 %L   POC Granulocyte 2.9  2 - 6.9   Granulocyte percent 48.6  37 - 80 %G   RBC 4.5  4.04 - 5.48 M/uL   Hemoglobin 13.0  12.2 - 16.2 g/dL   HCT, POC 56.2  13.0 - 47.9 %   MCV 86.9  80 - 97 fL   MCH, POC 29.0  27 - 31.2 pg   MCHC 33.4  31.8 - 35.4 g/dL   RDW, POC 86.5     Platelet Count, POC 287.0  142 - 424 K/uL   MPV 7.9  0 - 99.8 fL  PCP draws routine labs and nothing is emerging as of concern.  Assessment.  Axis I Maj.  Depressive disorder , alcohol abuse.  anxiety disorder NOS  Axis II deferred Axis III  hypertension  Axis IV moderate Axis V 60-65  Plan/Discussion: I took her vitals.  I reviewed CC, tobacco/med/surg Hx, meds effects/ side effects, problem list, therapies and responses as well as current situation/symptoms discussed options. Continue current effective medications. See orders and pt instructions for more details.  MEDICATIONS this encounter: Meds ordered this encounter  Medications  . FLUoxetine (PROZAC) 40 MG capsule    Sig: Take one capsule daily (40mg ) by mouth for anxiety and depression.    Dispense:  30 capsule    Refill:  2  . gabapentin (NEURONTIN) 100 MG capsule    Sig: Take 1 capsule (100 mg total) by mouth 4 (four) times daily.    Dispense:  120 capsule    Refill:  2  . busPIRone (BUSPAR) 15 MG tablet    Sig: Take 1 tablet (15 mg total) by mouth 4 (four) times daily.    Dispense:  120 tablet    Refill:  2   Medical Decision Making Problem Points:  Established problem, stable/improving (1), Review of last therapy session (1) and Review of psycho-social stressors (1) Data Points:  Review or order clinical lab tests (1) Review of medication regiment & side effects (2)  I certify that outpatient services furnished can reasonably be expected to improve the patient's condition.   Diannia Ruder, MD

## 2013-03-13 ENCOUNTER — Other Ambulatory Visit: Payer: Self-pay | Admitting: Family Medicine

## 2013-04-16 ENCOUNTER — Telehealth: Payer: Self-pay | Admitting: Family Medicine

## 2013-04-17 ENCOUNTER — Ambulatory Visit (INDEPENDENT_AMBULATORY_CARE_PROVIDER_SITE_OTHER): Payer: Medicare Other | Admitting: *Deleted

## 2013-04-17 ENCOUNTER — Other Ambulatory Visit (INDEPENDENT_AMBULATORY_CARE_PROVIDER_SITE_OTHER): Payer: Medicare Other

## 2013-04-17 DIAGNOSIS — Z23 Encounter for immunization: Secondary | ICD-10-CM

## 2013-04-17 DIAGNOSIS — I10 Essential (primary) hypertension: Secondary | ICD-10-CM

## 2013-04-17 DIAGNOSIS — E785 Hyperlipidemia, unspecified: Secondary | ICD-10-CM | POA: Diagnosis not present

## 2013-04-17 DIAGNOSIS — E559 Vitamin D deficiency, unspecified: Secondary | ICD-10-CM

## 2013-04-17 NOTE — Progress Notes (Unsigned)
Pt came in for labs only 

## 2013-04-18 ENCOUNTER — Ambulatory Visit (INDEPENDENT_AMBULATORY_CARE_PROVIDER_SITE_OTHER): Payer: Medicare Other | Admitting: Psychiatry

## 2013-04-18 ENCOUNTER — Encounter (HOSPITAL_COMMUNITY): Payer: Self-pay | Admitting: Psychiatry

## 2013-04-18 VITALS — BP 150/90 | Ht 61.0 in | Wt 144.0 lb

## 2013-04-18 DIAGNOSIS — F331 Major depressive disorder, recurrent, moderate: Secondary | ICD-10-CM

## 2013-04-18 DIAGNOSIS — F419 Anxiety disorder, unspecified: Secondary | ICD-10-CM

## 2013-04-18 DIAGNOSIS — F329 Major depressive disorder, single episode, unspecified: Secondary | ICD-10-CM

## 2013-04-18 DIAGNOSIS — F411 Generalized anxiety disorder: Secondary | ICD-10-CM

## 2013-04-18 DIAGNOSIS — F1010 Alcohol abuse, uncomplicated: Secondary | ICD-10-CM

## 2013-04-18 MED ORDER — BUSPIRONE HCL 15 MG PO TABS: 15.0000 mg | ORAL_TABLET | Freq: Four times a day (QID) | ORAL | Status: DC

## 2013-04-18 MED ORDER — GABAPENTIN 100 MG PO CAPS: 100.0000 mg | ORAL_CAPSULE | Freq: Four times a day (QID) | ORAL | Status: DC

## 2013-04-18 MED ORDER — FLUOXETINE HCL 40 MG PO CAPS: ORAL_CAPSULE | ORAL | Status: DC

## 2013-04-18 NOTE — Progress Notes (Signed)
Patient ID: Alexandria Chen, female   DOB: 22-Apr-1935, 78 y.o.   MRN: [id number] Patient ID: Alexandria Chen, female   DOB: 1935/02/03, 78 y.o.   MRN: [id number] [facility] [id number] Progress Note Alexandria Chen MRN: [id number] DOB: 04-26-1935 Age: 78 y.o.  Date: 04/18/2013 Start Time: 10:40 AM End Time: 10:59 AM  Chief Complaint: Chief Complaint  Patient presents with  . Anxiety  . Depression  . Follow-up   Subjective: "I'm doing well."  This patient is a 78 year old widowed black female who lives with her 8 year old grandson in [place]. She is a retired Therapist, sports.  The patient has had a long history of depression anxiety. She's always been an anxious person and thinks she inherited this trait from her mother. She used to abuse alcohol but had stopped for several years until a relapse last year. She was hospitalized in 2013 for alcohol relapse and has not used since. Her 37 year old mother lives across the street and calls her constantly and this makes her nervous. She does state that the addition of Neurontin and BuSpar helped considerably with her anxiety. In general her mood is good, her energy is good and she is sleeping well. She denies any suicidal ideation  Patient returns after 3 months. She continues to do very well. In fact she states she is less anxious than she's ever been. Her mother still calls her and bothers her a lot but she's learned to just put up with it and not really listen. Her mood has been good she is sleeping well. She has not any relapses into drinking alcohol.  Current psychiatric medication BuSpar 15 mg 4 times a day Neurontin 100 mg four times a day Prozac 40 mg daily.  LIFE STYLE CHANGE: Walking now up to 30 minutes a day. Having a garden again this year.  Vitals: BP 150/90  Ht 5\' 1"  (1.549 m)  Wt 144 lb (65.318 kg)  BMI 27.22 kg/m2  Past psychiatric history Patient [facility] due to relapse into  her alcohol.  She has significant history of depression and alcohol abuse.  She has at least 2 other psychiatric admission at behavioral [facility] which are exacerbated by alcohol intake.   Patient had a good response with Prozac and BuSpar.  She was taking benzodiazepine in the past however due to history of alcohol it was discontinued.  Patient denies any history of paranoia or psychosis.  Allergies: No Known Allergies Medical History: Past Medical History  Diagnosis Date  . HTN (hypertension)   . Depression   . Anxiety   . Alcohol abuse, in remission   Hypertension.  Patient sees Dr. Paulene Floor in [place].    Surgical History: History reviewed. No pertinent past surgical history. Family History: family history includes Alcohol abuse in her brother and father; Anxiety disorder in her mother; Dementia in her mother. There is no history of ADD / ADHD, Drug abuse, Bipolar disorder, Depression, OCD, Paranoid behavior, Schizophrenia, Seizures, Sexual abuse, or Physical abuse. Reviewed and no changes noted today.  Psychosocial history Patient lives by herself however his grandson stays sometime with her.  Patient has 4 daughter and one son.  2 of her daughter lives close by.      Alcohol and Substance use history The patient has significant history of drinking alcohol for more than 30 years.  She  has been admitted few times due to relapse into drinking and worsening of  depression .  Patient claims to be sober since she released from the hospital.    Mental status examination.  Patient is casually dressed.  She is pleasant cooperative and maintained good eye contact.  Her attention and concentration is  good.  Her speech is clear and coherent.  Her thought process is also logical and goal-directed.  She described her mood is  good and her affect is mood appropriate.  She denies any active or passive suicidal thoughts or homicidal thoughts.  There were no paranoia or delusion  present at this time.  There were no tremors or shakes present.  Her attention concentration is good.  She's alert oriented x3.  There were no flight of idea or loose association.  Her insight judgment and pulse control is better.  Lab Results:  Results for orders placed in visit on 11/27/12 (from the past 8736 hour(s))  CMP14+EGFR   Collection Time    11/27/12  5:39 PM      Result Value Range   Glucose 88  65 - 99 mg/dL   BUN 18  8 - 27 mg/dL   Creatinine, Ser 1.47 (*) 0.57 - 1.00 mg/dL   GFR calc non Af Amer 51 (*) >59 mL/min/1.73   GFR calc Af Amer 58 (*) >59 mL/min/1.73   BUN/Creatinine Ratio 17  11 - 26   Sodium 141  134 - 144 mmol/L   Potassium 4.1  3.5 - 5.2 mmol/L   Chloride 103  97 - 108 mmol/L   CO2 23  18 - 29 mmol/L   Calcium 9.8  8.6 - 10.2 mg/dL   Total Protein 7.1  6.0 - 8.5 g/dL   Albumin 4.1  3.5 - 4.8 g/dL   Globulin, Total 3.0  1.5 - 4.5 g/dL   Albumin/Globulin Ratio 1.4  1.1 - 2.5   Total Bilirubin 0.4  0.0 - 1.2 mg/dL   Alkaline Phosphatase 92  39 - 117 IU/L   AST 13  0 - 40 IU/L   ALT 10  0 - 32 IU/L  NMR, LIPOPROFILE   Collection Time    11/27/12  5:39 PM      Result Value Range   LDL Particle Number [id number] (*) <1000 nmol/L   LDLC SERPL CALC-MCNC 90  <100 mg/dL   HDL Cholesterol by NMR 35 (*) >=40 mg/dL   Triglycerides by NMR 94  <150 mg/dL   Cholesterol 829  <562 mg/dL   HDL Particle Number 13.0 (*) >=30.5 umol/L   Small LDL Particle Number 508  <=527 nmol/L   LDL Size 20.9  >20.5 nm   LP-IR Score 31  <=45  VITAMIN D 25 HYDROXY   Collection Time    11/27/12  5:39 PM      Result Value Range   Vit D, 25-Hydroxy 35.3  30.0 - 100.0 ng/mL  POCT CBC   Collection Time    11/27/12  7:10 PM      Result Value Range   WBC 5.9  4.6 - 10.2 K/uL   Lymph, poc 2.6  0.6 - 3.4   POC LYMPH PERCENT 43.8  10 - 50 %L   POC Granulocyte 2.9  2 - 6.9   Granulocyte percent 48.6  37 - 80 %G   RBC 4.5  4.04 - 5.48 M/uL   Hemoglobin 13.0  12.2 - 16.2 g/dL   HCT, POC  86.5  78.4 - 47.9 %   MCV 86.9  80 - 97 fL   MCH, POC 29.0  27 - 31.2 pg   MCHC 33.4  31.8 - 35.4 g/dL   RDW, POC 16.1     Platelet Count, POC 287.0  142 - 424 K/uL   MPV 7.9  0 - 99.8 fL  PCP draws routine labs and nothing is emerging as of concern.  Assessment.  Axis I Maj.  Depressive disorder , alcohol abuse.  anxiety disorder NOS  Axis II deferred Axis III hypertension  Axis IV moderate Axis V 60-65  Plan/Discussion: I took her vitals.  I reviewed CC, tobacco/med/surg Hx, meds effects/ side effects, problem list, therapies and responses as well as current situation/symptoms discussed options. Continue current effective medications. See orders and pt instructions for more details.  MEDICATIONS this encounter: Meds ordered this encounter  Medications  . gabapentin (NEURONTIN) 100 MG capsule    Sig: Take 1 capsule (100 mg total) by mouth 4 (four) times daily.    Dispense:  120 capsule    Refill:  2  . FLUoxetine (PROZAC) 40 MG capsule    Sig: Take one capsule daily (40mg ) by mouth for anxiety and depression.    Dispense:  30 capsule    Refill:  2  . busPIRone (BUSPAR) 15 MG tablet    Sig: Take 1 tablet (15 mg total) by mouth 4 (four) times daily.    Dispense:  120 tablet    Refill:  2   Medical Decision Making Problem Points:  Established problem, stable/improving (1), Review of last therapy session (1) and Review of psycho-social stressors (1) Data Points:  Review or order clinical lab tests (1) Review of medication regiment & side effects (2)  I certify that outpatient services furnished can reasonably be expected to improve the patient's condition.   Diannia Ruder, MD

## 2013-04-19 LAB — HEPATIC FUNCTION PANEL
Albumin: 4.1 g/dL (ref 3.5–4.8)
Alkaline Phosphatase: 71 IU/L (ref 39–117)
Bilirubin, Direct: 0.13 mg/dL (ref 0.00–0.40)
Total Protein: 6.9 g/dL (ref 6.0–8.5)

## 2013-04-19 LAB — BMP8+EGFR
BUN: 8 mg/dL (ref 8–27)
CO2: 25 mmol/L (ref 18–29)
Calcium: 9.7 mg/dL (ref 8.6–10.2)
Creatinine, Ser: 0.82 mg/dL (ref 0.57–1.00)
Glucose: 91 mg/dL (ref 65–99)

## 2013-04-19 LAB — NMR, LIPOPROFILE
HDL Cholesterol by NMR: 44 mg/dL (ref >=40)
LDLC SERPL CALC-MCNC: 100 mg/dL — ABNORMAL HIGH (ref <100)
LP-IR Score: 25 (ref <=45)

## 2013-04-24 NOTE — Telephone Encounter (Signed)
Bertram Gala called pt

## 2013-04-26 ENCOUNTER — Other Ambulatory Visit: Payer: Self-pay | Admitting: Family Medicine

## 2013-06-06 ENCOUNTER — Other Ambulatory Visit: Payer: Self-pay | Admitting: Family Medicine

## 2013-06-08 ENCOUNTER — Other Ambulatory Visit: Payer: Self-pay | Admitting: Family Medicine

## 2013-06-08 NOTE — Telephone Encounter (Signed)
Last seen 11/27/12  Alexandria Chen

## 2013-06-13 ENCOUNTER — Encounter: Payer: Self-pay | Admitting: General Practice

## 2013-06-13 ENCOUNTER — Ambulatory Visit (INDEPENDENT_AMBULATORY_CARE_PROVIDER_SITE_OTHER): Payer: Medicare Other | Admitting: General Practice

## 2013-06-13 VITALS — BP 100/56 | HR 54 | Temp 97.0°F | Ht 61.0 in | Wt 138.0 lb

## 2013-06-13 DIAGNOSIS — J069 Acute upper respiratory infection, unspecified: Secondary | ICD-10-CM | POA: Diagnosis not present

## 2013-06-13 MED ORDER — AMOXICILLIN 500 MG PO CAPS: 500.0000 mg | ORAL_CAPSULE | Freq: Two times a day (BID) | ORAL | Status: DC

## 2013-06-13 NOTE — Patient Instructions (Signed)

## 2013-06-13 NOTE — Progress Notes (Signed)
   Subjective:    Patient ID: Alexandria Chen, female    DOB: 1934/12/02, 79 y.o.   MRN: [id number]  Cough This is a new problem. The current episode started in the past 7 days. The problem has been unchanged. The cough is productive of sputum. Associated symptoms include nasal congestion and postnasal drip. Pertinent negatives include no chest pain, chills, fever, sore throat, shortness of breath or wheezing. The symptoms are aggravated by lying down. She has tried nothing for the symptoms. Her past medical history is significant for bronchitis. There is no history of asthma.      Review of Systems  Constitutional: Negative for fever and chills.  HENT: Positive for postnasal drip. Negative for sore throat.   Respiratory: Positive for cough. Negative for chest tightness, shortness of breath and wheezing.   Cardiovascular: Negative for chest pain and palpitations.       Objective:   Physical Exam  Constitutional: She is oriented to person, place, and time. She appears well-developed and well-nourished.  Cardiovascular: Normal rate, regular rhythm and normal heart sounds.   Pulmonary/Chest: Effort normal. No respiratory distress. She has decreased breath sounds in the right lower field and the left lower field. She exhibits no tenderness.  Neurological: She is alert and oriented to person, place, and time.  Skin: Skin is warm and dry.  Psychiatric: She has a normal mood and affect.          Assessment & Plan:  1. Upper respiratory infection - amoxicillin (AMOXIL) 500 MG capsule; Take 1 capsule (500 mg total) by mouth 2 (two) times daily.  Dispense: 20 capsule; Refill: 0 -increase fluids -RTO if symptoms worsen or unresolved Patient verbalized understanding Erby Pian, FNP-C

## 2013-06-14 ENCOUNTER — Telehealth: Payer: Self-pay | Admitting: Nurse Practitioner

## 2013-06-15 ENCOUNTER — Other Ambulatory Visit: Payer: Self-pay | Admitting: *Deleted

## 2013-06-15 DIAGNOSIS — H8110 Benign paroxysmal vertigo, unspecified ear: Secondary | ICD-10-CM

## 2013-06-15 MED ORDER — MECLIZINE HCL 25 MG PO TABS: 12.5000 mg | ORAL_TABLET | Freq: Two times a day (BID) | ORAL | Status: DC | PRN

## 2013-06-15 NOTE — Telephone Encounter (Signed)
Script sent to pharmacy.

## 2013-06-18 ENCOUNTER — Other Ambulatory Visit: Payer: Self-pay

## 2013-06-18 MED ORDER — ATENOLOL 25 MG PO TABS: 25.0000 mg | ORAL_TABLET | Freq: Every day | ORAL | Status: DC

## 2013-06-29 ENCOUNTER — Ambulatory Visit (INDEPENDENT_AMBULATORY_CARE_PROVIDER_SITE_OTHER): Payer: Medicare Other | Admitting: Psychiatry

## 2013-06-29 DIAGNOSIS — F331 Major depressive disorder, recurrent, moderate: Secondary | ICD-10-CM | POA: Diagnosis not present

## 2013-06-29 DIAGNOSIS — F411 Generalized anxiety disorder: Secondary | ICD-10-CM | POA: Diagnosis not present

## 2013-06-29 NOTE — Patient Instructions (Signed)
Discussed orally 

## 2013-06-29 NOTE — Progress Notes (Signed)
Patient:  Alexandria Chen   DOB: 1934/05/10  MR Number: [id number]  Location: [place]:  [address] [place],  [place], [address]  Start: Friday 06/22/2013 10:00 AM End: Friday 2/29/2015 10:50 AM  Provider/Observer:     Maurice Small, MSW, LCSW   Chief Complaint:      Chief Complaint  Patient presents with  . Anxiety    Reason For Service:     The patient is a 79 year old African American female who has a long-standing history of recurrent periods of symptoms of depression and anxiety. Patient also has a history of alcohol abuse/dependence and was hospitalized in July 2013 due to relapse and drinking approximately a 6 pack of beer daily along with experiencing increased symptoms of depression and anxiety. This was patient's third psychiatric hospitalization. Patient is seen today for follow up appointment as he is experiencing increased anxiety and sadness related to the recent death of her 20 year old mother. She also is concerned about her 19 year old maternal uncle who has leukemia and has a prognosis of 2 months to live.  Interventions Strategy:  Supportive therapy, cognitive behavioral therapy  Participation Level:   Active  Participation Quality:  Appropriate      Behavioral Observation:   Fidgety, talkative, cooperative, anxious  Current Psychosocial Factors: The patient's 58 year old mother died about a month ago. Her 38 year old maternal also recently was diagnosed with leukemia and has a prognosis of 2 months to live.  Content of Session:   Reviewing symptoms, processing grief and loss issues, identifying ways to increase normalcy and improve routine and structure, encouraging patient to increase social contact  Current Status:   The patient reports increased anxiety and sadness related to the recent death of her mother.   Patient Progress:   The patient reports increased anxiety and sadness related to the recent death of her mother. Therapist works with patient  to process grief and loss issues. Patient also is worried about her    maternal uncle.  Patient states she has been more nervous and fidgety but is pleased that she has handled things as well as she has. She has maintained abstinence from alcohol. She has been    performing light household activities and has been reading. Therapist works with patient to process feelings and to identify coping techniques as well as coping statements. Therapist also works    with patient to review relaxation techniques including diaphragmatic breathing. Therapist encourages patient to increase social contact.   Target Goals:   Improve mood, decrease anxiety and excessive worry, and resume normal interest in activities   Last Reviewed:    Goals Addressed Today:    Decrease anxiety and excessive worry   Impression/Diagnosis:   The patient has a long-standing history of recurrent periods of depression and anxiety.  Her symptoms have included depressed mood, social withdrawal, lack of interest in activities, poor motivation, excessive  worry, feeling jittery, and ruminating thoughts.  Patient also has a history of alcohol dependence . Diagnoses: Maj. depressive disorder, recurrent, moderate; generalized anxiety disorder, alcohol dependence  Diagnosis:  Axis I:  Major depressive disorder, recurrent, moderate  Generalized anxiety disorder          Axis II: No diagnosis

## 2013-07-02 ENCOUNTER — Other Ambulatory Visit (HOSPITAL_COMMUNITY): Payer: Self-pay | Admitting: Psychiatry

## 2013-07-04 ENCOUNTER — Other Ambulatory Visit: Payer: Self-pay | Admitting: Nurse Practitioner

## 2013-07-17 ENCOUNTER — Ambulatory Visit (INDEPENDENT_AMBULATORY_CARE_PROVIDER_SITE_OTHER): Payer: Medicare Other | Admitting: Psychiatry

## 2013-07-17 ENCOUNTER — Encounter (HOSPITAL_COMMUNITY): Payer: Self-pay | Admitting: Psychiatry

## 2013-07-17 VITALS — BP 140/70 | Ht 61.0 in | Wt 139.0 lb

## 2013-07-17 DIAGNOSIS — F331 Major depressive disorder, recurrent, moderate: Secondary | ICD-10-CM

## 2013-07-17 DIAGNOSIS — F419 Anxiety disorder, unspecified: Secondary | ICD-10-CM

## 2013-07-17 DIAGNOSIS — F1010 Alcohol abuse, uncomplicated: Secondary | ICD-10-CM

## 2013-07-17 DIAGNOSIS — F411 Generalized anxiety disorder: Secondary | ICD-10-CM

## 2013-07-17 DIAGNOSIS — F329 Major depressive disorder, single episode, unspecified: Secondary | ICD-10-CM

## 2013-07-17 MED ORDER — GABAPENTIN 100 MG PO CAPS: 100.0000 mg | ORAL_CAPSULE | Freq: Four times a day (QID) | ORAL | Status: DC

## 2013-07-17 MED ORDER — FLUOXETINE HCL 40 MG PO CAPS: ORAL_CAPSULE | ORAL | Status: DC

## 2013-07-17 NOTE — Progress Notes (Signed)
Patient ID: Alexandria Chen, female   DOB: 1934/12/10, 79 y.o.   MRN: [id number] Patient ID: Alexandria Chen, female   DOB: 1934-08-05, 79 y.o.   MRN: [id number] Patient ID: Alexandria Chen, female   DOB: 22-Dec-1934, 79 y.o.   MRN: [id number] [facility] [id number] Progress Note Alexandria Chen MRN: [id number] DOB: August 16, 1934 Age: 79 y.o.  Date: 07/17/2013 Start Time: 10:40 AM End Time: 10:59 AM  Chief Complaint: Chief Complaint  Patient presents with  . Anxiety  . Depression  . Follow-up   Subjective: I've been sad."  This patient is a 79 year old widowed black female who lives with her 7 year old grandson in [place]. She is a retired Engineer, materials.  The patient has had a long history of depression anxiety. She's always been an anxious person and thinks she inherited this trait from her mother. She used to abuse alcohol but had stopped for several years until a relapse last year. She was hospitalized in 2013 for alcohol relapse and has not used since. Her 85 year old mother lives across the street and calls her constantly and this makes her nervous. She does state that the addition of Neurontin and BuSpar helped considerably with her anxiety. In general her mood is good, her energy is good and she is sleeping well. She denies any suicidal ideation  Patient returns after 3 months. Her mother died at the end of 06-02-2022. Her uncle died shortly thereafter. They were both in their 90s. She misses them both. Her mother lived across the street and it feels strange to her to not have her mom there. She spending time with other family members now. She still thinks her medications are helpful and she is sleeping well. She has not started drinking again  Current psychiatric medication BuSpar 15 mg 4 times a day Neurontin 100 mg four times a day Prozac 40 mg daily.  LIFE STYLE CHANGE: Walking now up to 30 minutes a day. Having a garden again this year.  Vitals: BP 140/70  Ht '5\' 1"'  (1.549 m)  Wt  139 lb (63.05 kg)  BMI 26.28 kg/m2  Past psychiatric history Patient was  admitted to behavioral [place] in June 2013 due to relapse into her alcohol.  She has significant history of depression and alcohol abuse.  She has at least 2 other psychiatric admission at behavioral [facility] which are exacerbated by alcohol intake.   Patient had a good response with Prozac and BuSpar.  She was taking benzodiazepine in the past however due to history of alcohol it was discontinued.  Patient denies any history of paranoia or psychosis.  Allergies: No Known Allergies Medical History: Past Medical History  Diagnosis Date  . HTN (hypertension)   . Depression   . Anxiety   . Alcohol abuse, in remission   Hypertension.  Patient sees Dr. Ronnald Collum in [place].    Surgical History: History reviewed. No pertinent past surgical history. Family History: family history includes Alcohol abuse in her brother and father; Anxiety disorder in her mother; Dementia in her mother. There is no history of ADD / ADHD, Drug abuse, Bipolar disorder, Depression, OCD, Paranoid behavior, Schizophrenia, Seizures, Sexual abuse, or Physical abuse. Reviewed and no changes noted today.  Psychosocial history Patient lives by herself however his grandson stays sometime with her.  Patient has 4 daughter and one son.  2 of her daughter lives close by.      Alcohol and Substance use history The patient has significant  history of drinking alcohol for more than 30 years.  She  has been admitted few times due to relapse into drinking and worsening of depression .  Patient claims to be sober since she released from the hospital.    Mental status examination.  Patient is casually dressed.  She is pleasant cooperative and maintained good eye contact.  Her attention and concentration is  good.  Her speech is clear and coherent.  Her thought process is also logical and goal-directed.  She described her mood as sad  and her affect is mood appropriate.  She denies any active or passive suicidal thoughts or homicidal thoughts.  There were no paranoia or delusion present at this time.  There were no tremors or shakes present.  Her attention concentration is good.  She's alert oriented x3.  There were no flight of idea or loose association.  Her insight judgment and pulse control is better.  Lab Results:  Results for orders placed in visit on 04/17/13 (from the past 8736 hour(s))  BMP8+EGFR   Collection Time    04/17/13  9:56 AM      Result Value Ref Range   Glucose 91  65 - 99 mg/dL   BUN 8  8 - 27 mg/dL   Creatinine, Ser 0.82  0.57 - 1.00 mg/dL   GFR calc non Af Amer 70  >59 mL/min/1.73   GFR calc Af Amer 80  >59 mL/min/1.73   BUN/Creatinine Ratio 10 (*) 11 - 26   Sodium 141  134 - 144 mmol/L   Potassium 4.4  3.5 - 5.2 mmol/L   Chloride 101  97 - 108 mmol/L   CO2 25  18 - 29 mmol/L   Calcium 9.7  8.6 - 10.2 mg/dL  NMR, LIPOPROFILE   Collection Time    04/17/13  9:56 AM      Result Value Ref Range   LDL Particle Number [id number] (*) <1000 nmol/L   LDLC SERPL CALC-MCNC 100 (*) <100 mg/dL   HDL Cholesterol by NMR 44  >=40 mg/dL   Triglycerides by NMR 85  <150 mg/dL   Cholesterol 161  <200 mg/dL   HDL Particle Number 25.7 (*) >=30.5 umol/L   Small LDL Particle Number [id number] (*) <=527 nmol/L   LDL Size 21.2  >20.5 nm   LP-IR Score 25  <=45  HEPATIC FUNCTION PANEL   Collection Time    04/17/13  9:56 AM      Result Value Ref Range   Total Protein 6.9  6.0 - 8.5 g/dL   Albumin 4.1  3.5 - 4.8 g/dL   Total Bilirubin 0.4  0.0 - 1.2 mg/dL   Bilirubin, Direct 0.13  0.00 - 0.40 mg/dL   Alkaline Phosphatase 71  39 - 117 IU/L   AST 18  0 - 40 IU/L   ALT 16  0 - 32 IU/L  VITAMIN D 25 HYDROXY   Collection Time    04/17/13  9:56 AM      Result Value Ref Range   Vit D, 25-Hydroxy 39.7  30.0 - 100.0 ng/mL  Results for orders placed in visit on 11/27/12 (from the past 8736 hour(s))  CMP14+EGFR   Collection  Time    11/27/12  5:39 PM      Result Value Ref Range   Glucose 88  65 - 99 mg/dL   BUN 18  8 - 27 mg/dL   Creatinine, Ser 1.07 (*) 0.57 - 1.00 mg/dL   GFR calc non Af  Amer 51 (*) >59 mL/min/1.73   GFR calc Af Amer 58 (*) >59 mL/min/1.73   BUN/Creatinine Ratio 17  11 - 26   Sodium 141  134 - 144 mmol/L   Potassium 4.1  3.5 - 5.2 mmol/L   Chloride 103  97 - 108 mmol/L   CO2 23  18 - 29 mmol/L   Calcium 9.8  8.6 - 10.2 mg/dL   Total Protein 7.1  6.0 - 8.5 g/dL   Albumin 4.1  3.5 - 4.8 g/dL   Globulin, Total 3.0  1.5 - 4.5 g/dL   Albumin/Globulin Ratio 1.4  1.1 - 2.5   Total Bilirubin 0.4  0.0 - 1.2 mg/dL   Alkaline Phosphatase 92  39 - 117 IU/L   AST 13  0 - 40 IU/L   ALT 10  0 - 32 IU/L  NMR, LIPOPROFILE   Collection Time    11/27/12  5:39 PM      Result Value Ref Range   LDL Particle Number 1330 (*) <1000 nmol/L   LDLC SERPL CALC-MCNC 90  <100 mg/dL   HDL Cholesterol by NMR 35 (*) >=40 mg/dL   Triglycerides by NMR 94  <150 mg/dL   Cholesterol 144  <200 mg/dL   HDL Particle Number 23.6 (*) >=30.5 umol/L   Small LDL Particle Number 508  <=527 nmol/L   LDL Size 20.9  >20.5 nm   LP-IR Score 31  <=45  VITAMIN D 25 HYDROXY   Collection Time    11/27/12  5:39 PM      Result Value Ref Range   Vit D, 25-Hydroxy 35.3  30.0 - 100.0 ng/mL  POCT CBC   Collection Time    11/27/12  7:10 PM      Result Value Ref Range   WBC 5.9  4.6 - 10.2 K/uL   Lymph, poc 2.6  0.6 - 3.4   POC LYMPH PERCENT 43.8  10 - 50 %L   POC Granulocyte 2.9  2 - 6.9   Granulocyte percent 48.6  37 - 80 %G   RBC 4.5  4.04 - 5.48 M/uL   Hemoglobin 13.0  12.2 - 16.2 g/dL   HCT, POC 38.9  37.7 - 47.9 %   MCV 86.9  80 - 97 fL   MCH, POC 29.0  27 - 31.2 pg   MCHC 33.4  31.8 - 35.4 g/dL   RDW, POC 15.1     Platelet Count, POC 287.0  142 - 424 K/uL   MPV 7.9  0 - 99.8 fL  PCP draws routine labs and nothing is emerging as of concern.  Assessment.  Axis I Maj.  Depressive disorder , alcohol abuse.  anxiety  disorder NOS  Axis II deferred Axis III hypertension  Axis IV moderate Axis V 60-65  Plan/Discussion: I took her vitals.  I reviewed CC, tobacco/med/surg Hx, meds effects/ side effects, problem list, therapies and responses as well as current situation/symptoms discussed options. Continue current effective medications. She'll return in 3 months but if her depression worsens she will call us See orders and pt instructions for more details.  MEDICATIONS this encounter: Meds ordered this encounter  Medications  . gabapentin (NEURONTIN) 100 MG capsule    Sig: Take 1 capsule (100 mg total) by mouth 4 (four) times daily.    Dispense:  120 capsule    Refill:  2  . FLUoxetine (PROZAC) 40 MG capsule    Sig: Take one capsule daily (59m) by mouth for anxiety and  depression.    Dispense:  30 capsule    Refill:  2   Medical Decision Making Problem Points:  Established problem, stable/improving (1), Review of last therapy session (1) and Review of psycho-social stressors (1) Data Points:  Review or order clinical lab tests (1) Review of medication regiment & side effects (2)  I certify that outpatient services furnished can reasonably be expected to improve the patient's condition.   Levonne Spiller, MD

## 2013-07-19 DIAGNOSIS — H2510 Age-related nuclear cataract, unspecified eye: Secondary | ICD-10-CM | POA: Diagnosis not present

## 2013-07-19 DIAGNOSIS — H40039 Anatomical narrow angle, unspecified eye: Secondary | ICD-10-CM | POA: Diagnosis not present

## 2013-07-23 DIAGNOSIS — H16229 Keratoconjunctivitis sicca, not specified as Sjogren's, unspecified eye: Secondary | ICD-10-CM | POA: Diagnosis not present

## 2013-07-25 ENCOUNTER — Other Ambulatory Visit (INDEPENDENT_AMBULATORY_CARE_PROVIDER_SITE_OTHER): Payer: Medicare Other

## 2013-07-25 DIAGNOSIS — I10 Essential (primary) hypertension: Secondary | ICD-10-CM

## 2013-07-25 DIAGNOSIS — E559 Vitamin D deficiency, unspecified: Secondary | ICD-10-CM | POA: Diagnosis not present

## 2013-07-25 DIAGNOSIS — E785 Hyperlipidemia, unspecified: Secondary | ICD-10-CM

## 2013-07-25 NOTE — Progress Notes (Signed)
Pt came in for labs only 

## 2013-07-27 LAB — NMR, LIPOPROFILE
Cholesterol: 159 mg/dL (ref <200)
HDL CHOLESTEROL BY NMR: 44 mg/dL (ref >=40)
HDL Particle Number: 23.4 umol/L — ABNORMAL LOW (ref >=30.5)
LDL PARTICLE NUMBER: 1325 nmol/L — AB (ref <1000)
LDL SIZE: 21.4 nm (ref >20.5)
LDLC SERPL CALC-MCNC: 100 mg/dL — ABNORMAL HIGH (ref <100)
LP-IR Score: <25 (ref <=45)
SMALL LDL PARTICLE NUMBER: 481 nmol/L (ref <=527)
TRIGLYCERIDES BY NMR: 75 mg/dL (ref <150)

## 2013-07-27 LAB — BMP8+EGFR
BUN / CREAT RATIO: 18 (ref 11–26)
BUN: 16 mg/dL (ref 8–27)
CALCIUM: 9.5 mg/dL (ref 8.7–10.3)
CO2: 25 mmol/L (ref 18–29)
Chloride: 100 mmol/L (ref 97–108)
Creatinine, Ser: 0.9 mg/dL (ref 0.57–1.00)
GFR calc Af Amer: 72 mL/min/{1.73_m2} (ref >59)
GFR calc non Af Amer: 62 mL/min/{1.73_m2} (ref >59)
Glucose: 97 mg/dL (ref 65–99)
POTASSIUM: 4.1 mmol/L (ref 3.5–5.2)
Sodium: 139 mmol/L (ref 134–144)

## 2013-07-27 LAB — HEPATIC FUNCTION PANEL
ALT: 11 IU/L (ref 0–32)
AST: 21 IU/L (ref 0–40)
Albumin: 4.1 g/dL (ref 3.5–4.8)
Alkaline Phosphatase: 74 IU/L (ref 39–117)
Bilirubin, Direct: 0.11 mg/dL (ref 0.00–0.40)
Total Bilirubin: 0.4 mg/dL (ref 0.0–1.2)
Total Protein: 6.9 g/dL (ref 6.0–8.5)

## 2013-07-27 LAB — VITAMIN D 25 HYDROXY (VIT D DEFICIENCY, FRACTURES): VIT D 25 HYDROXY: 28.4 ng/mL — AB (ref 30.0–100.0)

## 2013-08-06 ENCOUNTER — Telehealth: Payer: Self-pay | Admitting: Nurse Practitioner

## 2013-08-06 NOTE — Telephone Encounter (Signed)
Patient aware.

## 2013-08-23 DIAGNOSIS — H903 Sensorineural hearing loss, bilateral: Secondary | ICD-10-CM | POA: Diagnosis not present

## 2013-09-27 ENCOUNTER — Ambulatory Visit (INDEPENDENT_AMBULATORY_CARE_PROVIDER_SITE_OTHER): Payer: Medicare Other | Admitting: Family Medicine

## 2013-09-27 VITALS — BP 151/80 | HR 62 | Temp 99.1°F | Ht 61.0 in | Wt 139.8 lb

## 2013-09-27 DIAGNOSIS — J209 Acute bronchitis, unspecified: Secondary | ICD-10-CM | POA: Diagnosis not present

## 2013-09-27 MED ORDER — ALBUTEROL SULFATE HFA 108 (90 BASE) MCG/ACT IN AERS: 2.0000 | INHALATION_SPRAY | Freq: Four times a day (QID) | RESPIRATORY_TRACT | Status: DC | PRN

## 2013-09-27 MED ORDER — METHYLPREDNISOLONE (PAK) 4 MG PO TABS: ORAL_TABLET | ORAL | Status: DC

## 2013-09-27 MED ORDER — AMOXICILLIN 875 MG PO TABS: 875.0000 mg | ORAL_TABLET | Freq: Two times a day (BID) | ORAL | Status: DC

## 2013-09-27 NOTE — Progress Notes (Signed)
   Subjective:    Patient ID: Leticia Penna, female    DOB: [birth year], 79 y.o.   MRN: [id number]  HPI  This 79 y.o. female presents for evaluation of URI sx's.  Review of Systems    No chest pain, SOB, HA, dizziness, vision change, N/V, diarrhea, constipation, dysuria, urinary urgency or frequency, myalgias, arthralgias or rash.  Objective:   Physical Exam  Vital signs noted  Well developed well nourished female.  HEENT - Head atraumatic Normocephalic                Eyes - PERRLA, Conjuctiva - clear Sclera- Clear EOMI                Ears - EAC's Wnl TM's Wnl Gross Hearing WNL                Nose - Nares patent                 Throat - oropharanx wnl Respiratory - Lungs CTA bilateral Cardiac - RRR S1 and S2 without murmur GI - Abdomen soft Nontender and bowel sounds active x 4 Extremities - No edema. Neuro - Grossly intact.      Assessment & Plan:  Acute bronchitis - Plan: amoxicillin (AMOXIL) 875 MG tablet, methylPREDNIsolone (MEDROL DOSPACK) 4 MG tablet, albuterol (PROVENTIL HFA;VENTOLIN HFA) 108 (90 BASE) MCG/ACT inhaler  Lysbeth Penner FNP

## 2013-10-17 ENCOUNTER — Ambulatory Visit (HOSPITAL_COMMUNITY): Payer: Self-pay | Admitting: Psychiatry

## 2013-10-19 ENCOUNTER — Encounter (HOSPITAL_COMMUNITY): Payer: Self-pay | Admitting: Psychiatry

## 2013-10-19 ENCOUNTER — Ambulatory Visit (INDEPENDENT_AMBULATORY_CARE_PROVIDER_SITE_OTHER): Payer: Medicare Other | Admitting: Psychiatry

## 2013-10-19 VITALS — BP 150/82 | Ht 61.0 in | Wt 143.0 lb

## 2013-10-19 DIAGNOSIS — F1010 Alcohol abuse, uncomplicated: Secondary | ICD-10-CM

## 2013-10-19 DIAGNOSIS — F329 Major depressive disorder, single episode, unspecified: Secondary | ICD-10-CM

## 2013-10-19 DIAGNOSIS — F331 Major depressive disorder, recurrent, moderate: Secondary | ICD-10-CM

## 2013-10-19 DIAGNOSIS — F419 Anxiety disorder, unspecified: Secondary | ICD-10-CM

## 2013-10-19 DIAGNOSIS — F411 Generalized anxiety disorder: Secondary | ICD-10-CM

## 2013-10-19 MED ORDER — GABAPENTIN 100 MG PO CAPS: 100.0000 mg | ORAL_CAPSULE | Freq: Four times a day (QID) | ORAL | Status: DC

## 2013-10-19 MED ORDER — BUSPIRONE HCL 15 MG PO TABS: 15.0000 mg | ORAL_TABLET | Freq: Four times a day (QID) | ORAL | Status: DC

## 2013-10-19 MED ORDER — FLUOXETINE HCL 40 MG PO CAPS: ORAL_CAPSULE | ORAL | Status: DC

## 2013-10-19 NOTE — Progress Notes (Signed)
Patient ID: Alexandria Chen, female   DOB: 09/03/1934, 79 y.o.   MRN: [id number] Patient ID: Alexandria Chen, female   DOB: [birth year], 79 y.o.   MRN: [id number] Patient ID: Alexandria Chen, female   DOB: 04/26/1935, 79 y.o.   MRN: [id number] Patient ID: Alexandria Chen, female   DOB: 1935-01-20, 79 y.o.   MRN: [id number] [facility] [id number] Progress Note Alexandria Chen MRN: [id number] DOB: [birth year] Age: 79 y.o.  Date: 10/19/2013 Start Time: 10:40 AM End Time: 10:59 AM  Chief Complaint: Chief Complaint  Patient presents with  . Anxiety  . Depression  . Follow-up   Subjective:  I'm doing better."  This patient is a 79 year old widowed black female who lives with her 2 year old grandson in [place]. She is a retired Engineer, materials.  The patient has had a long history of depression anxiety. She's always been an anxious person and thinks she inherited this trait from her mother. She used to abuse alcohol but had stopped for several years until a relapse last year. She was hospitalized in 2013 for alcohol relapse and has not used since. Her 40 year old mother lives across the street and calls her constantly and this makes her nervous. She does state that the addition of Neurontin and BuSpar helped considerably with her anxiety. In general her mood is good, her energy is good and she is sleeping well. She denies any suicidal ideation  Patient returns after 3 months. She is doing a little bit better. Last time she was very sad because her mother and uncle have both died in 05-27-2022. She still misses them both quite a bit but she's not quite as depressed. She's getting up and doing things and working in her garden. She feels nervous in the morning but when she gets up and starts moving around she feels pretty good. She denies being suicidal or having any psychotic symptoms. she has not used any alcohol or drugs  Current psychiatric medication BuSpar 15 mg 4 times a day Neurontin 100 mg four times a  day Prozac 40 mg daily.  LIFE STYLE CHANGE: Walking now up to 30 minutes a day. Having a garden again this year.  Vitals: BP 150/82  Ht _0  (1.549 m)  Wt 143 lb (64.864 kg)  BMI 27.03 kg/m2  Past psychiatric history Patient was  admitted to behavioral [place] in June 2013 due to relapse into her alcohol.  She has significant history of depression and alcohol abuse.  She has at least 2 other psychiatric admission at behavioral [facility] which are exacerbated by alcohol intake.   Patient had a good response with Prozac and BuSpar.  She was taking benzodiazepine in the past however due to history of alcohol it was discontinued.  Patient denies any history of paranoia or psychosis.  Allergies: No Known Allergies Medical History: Past Medical History  Diagnosis Date  . HTN (hypertension)   . Depression   . Anxiety   . Alcohol abuse, in remission   Hypertension.  Patient sees Dr. Ronnald Collum in [place].    Surgical History: History reviewed. No pertinent past surgical history. Family History: family history includes Alcohol abuse in her brother and father; Anxiety disorder in her mother; Dementia in her mother. There is no history of ADD / ADHD, Drug abuse, Bipolar disorder, Depression, OCD, Paranoid behavior, Schizophrenia, Seizures, Sexual abuse, or Physical abuse. Reviewed and no changes noted today.  Psychosocial history Patient lives by herself however his grandson  stays sometime with her.  Patient has 4 daughter and one son.  2 of her daughter lives close by.      Alcohol and Substance use history The patient has significant history of drinking alcohol for more than 30 years.  She  has been admitted few times due to relapse into drinking and worsening of depression .  Patient claims to be sober since she released from the hospital.    Mental status examination.  Patient is casually dressed.  She is pleasant cooperative and maintained good eye contact.   Her attention and concentration is  good.  Her speech is clear and coherent.  Her thought process is also logical and goal-directed.  She described her mood as fairly good and her affect is mood appropriate.  She denies any active or passive suicidal thoughts or homicidal thoughts.  There were no paranoia or delusion present at this time.  There were no tremors or shakes present.  Her attention concentration is good.  She's alert oriented x3.  There were no flight of idea or loose association.  Her insight judgment and pulse control is better.  Lab Results:  Results for orders placed in visit on 07/25/13 (from the past 8736 hour(s))  BMP8+EGFR   Collection Time    07/25/13 10:19 AM      Result Value Ref Range   Glucose 97  65 - 99 mg/dL   BUN 16  8 - 27 mg/dL   Creatinine, Ser 0.90  0.57 - 1.00 mg/dL   GFR calc non Af Amer 62  >59 mL/min/1.73   GFR calc Af Amer 72  >59 mL/min/1.73   BUN/Creatinine Ratio 18  11 - 26   Sodium 139  134 - 144 mmol/L   Potassium 4.1  3.5 - 5.2 mmol/L   Chloride 100  97 - 108 mmol/L   CO2 25  18 - 29 mmol/L   Calcium 9.5  8.7 - 10.3 mg/dL  HEPATIC FUNCTION PANEL   Collection Time    07/25/13 10:19 AM      Result Value Ref Range   Total Protein 6.9  6.0 - 8.5 g/dL   Albumin 4.1  3.5 - 4.8 g/dL   Total Bilirubin 0.4  0.0 - 1.2 mg/dL   Bilirubin, Direct 0.11  0.00 - 0.40 mg/dL   Alkaline Phosphatase 74  39 - 117 IU/L   AST 21  0 - 40 IU/L   ALT 11  0 - 32 IU/L  NMR, LIPOPROFILE   Collection Time    07/25/13 10:19 AM      Result Value Ref Range   LDL Particle Number [id number] (*) <1000 nmol/L   LDLC SERPL CALC-MCNC 100 (*) <100 mg/dL   HDL Cholesterol by NMR 44  >=40 mg/dL   Triglycerides by NMR 75  <150 mg/dL   Cholesterol 159  <200 mg/dL   HDL Particle Number 23.4 (*) >=30.5 umol/L   Small LDL Particle Number [id number]  <=527 nmol/L   LDL Size 21.4  >20.5 nm   LP-IR Score < 25  <= 45  VITAMIN D 25 HYDROXY   Collection Time    07/25/13 10:19 AM      Result  Value Ref Range   Vit D, 25-Hydroxy 28.4 (*) 30.0 - 100.0 ng/mL  Results for orders placed in visit on 04/17/13 (from the past 8736 hour(s))  BMP8+EGFR   Collection Time    04/17/13  9:56 AM      Result Value Ref Range  Glucose 91  65 - 99 mg/dL   BUN 8  8 - 27 mg/dL   Creatinine, Ser 0.82  0.57 - 1.00 mg/dL   GFR calc non Af Amer 70  >59 mL/min/1.73   GFR calc Af Amer 80  >59 mL/min/1.73   BUN/Creatinine Ratio 10 (*) 11 - 26   Sodium 141  134 - 144 mmol/L   Potassium 4.4  3.5 - 5.2 mmol/L   Chloride 101  97 - 108 mmol/L   CO2 25  18 - 29 mmol/L   Calcium 9.7  8.6 - 10.2 mg/dL  NMR, LIPOPROFILE   Collection Time    04/17/13  9:56 AM      Result Value Ref Range   LDL Particle Number [id number] (*) <1000 nmol/L   LDLC SERPL CALC-MCNC 100 (*) <100 mg/dL   HDL Cholesterol by NMR 44  >=40 mg/dL   Triglycerides by NMR 85  <150 mg/dL   Cholesterol 161  <200 mg/dL   HDL Particle Number 25.7 (*) >=30.5 umol/L   Small LDL Particle Number [id number] (*) <=527 nmol/L   LDL Size 21.2  >20.5 nm   LP-IR Score 25  <=45  HEPATIC FUNCTION PANEL   Collection Time    04/17/13  9:56 AM      Result Value Ref Range   Total Protein 6.9  6.0 - 8.5 g/dL   Albumin 4.1  3.5 - 4.8 g/dL   Total Bilirubin 0.4  0.0 - 1.2 mg/dL   Bilirubin, Direct 0.13  0.00 - 0.40 mg/dL   Alkaline Phosphatase 71  39 - 117 IU/L   AST 18  0 - 40 IU/L   ALT 16  0 - 32 IU/L  VITAMIN D 25 HYDROXY   Collection Time    04/17/13  9:56 AM      Result Value Ref Range   Vit D, 25-Hydroxy 39.7  30.0 - 100.0 ng/mL  Results for orders placed in visit on 11/27/12 (from the past 8736 hour(s))  CMP14+EGFR   Collection Time    11/27/12  5:39 PM      Result Value Ref Range   Glucose 88  65 - 99 mg/dL   BUN 18  8 - 27 mg/dL   Creatinine, Ser 1.07 (*) 0.57 - 1.00 mg/dL   GFR calc non Af Amer 51 (*) >59 mL/min/1.73   GFR calc Af Amer 58 (*) >59 mL/min/1.73   BUN/Creatinine Ratio 17  11 - 26   Sodium 141  134 - 144 mmol/L   Potassium 4.1   3.5 - 5.2 mmol/L   Chloride 103  97 - 108 mmol/L   CO2 23  18 - 29 mmol/L   Calcium 9.8  8.6 - 10.2 mg/dL   Total Protein 7.1  6.0 - 8.5 g/dL   Albumin 4.1  3.5 - 4.8 g/dL   Globulin, Total 3.0  1.5 - 4.5 g/dL   Albumin/Globulin Ratio 1.4  1.1 - 2.5   Total Bilirubin 0.4  0.0 - 1.2 mg/dL   Alkaline Phosphatase 92  39 - 117 IU/L   AST 13  0 - 40 IU/L   ALT 10  0 - 32 IU/L  NMR, LIPOPROFILE   Collection Time    11/27/12  5:39 PM      Result Value Ref Range   LDL Particle Number [id number] (*) <1000 nmol/L   LDLC SERPL CALC-MCNC 90  <100 mg/dL   HDL Cholesterol by NMR 35 (*) >=40 mg/dL   Triglycerides by  NMR 94  <150 mg/dL   Cholesterol 144  <200 mg/dL   HDL Particle Number 23.6 (*) >=30.5 umol/L   Small LDL Particle Number [id number]  <=527 nmol/L   LDL Size 20.9  >20.5 nm   LP-IR Score 31  <=45  VITAMIN D 25 HYDROXY   Collection Time    11/27/12  5:39 PM      Result Value Ref Range   Vit D, 25-Hydroxy 35.3  30.0 - 100.0 ng/mL  POCT CBC   Collection Time    11/27/12  7:10 PM      Result Value Ref Range   WBC 5.9  4.6 - 10.2 K/uL   Lymph, poc 2.6  0.6 - 3.4   POC LYMPH PERCENT 43.8  10 - 50 %L   POC Granulocyte 2.9  2 - 6.9   Granulocyte percent 48.6  37 - 80 %G   RBC 4.5  4.04 - 5.48 M/uL   Hemoglobin 13.0  12.2 - 16.2 g/dL   HCT, POC 38.9  37.7 - 47.9 %   MCV 86.9  80 - 97 fL   MCH, POC 29.0  27 - 31.2 pg   MCHC 33.4  31.8 - 35.4 g/dL   RDW, POC 15.1     Platelet Count, POC 287.0  142 - 424 K/uL   MPV 7.9  0 - 99.8 fL  PCP draws routine labs and nothing is emerging as of concern.  Assessment.  Axis I Maj.  Depressive disorder , alcohol abuse.  anxiety disorder NOS  Axis II deferred Axis III hypertension  Axis IV moderate Axis V 60-65  Plan/Discussion: I took her vitals.  I reviewed CC, tobacco/med/surg Hx, meds effects/ side effects, problem list, therapies and responses as well as current situation/symptoms discussed options. Continue current effective medications.  She'll return in 3 months but if her depression worsens she will call us See orders and pt instructions for more details.  MEDICATIONS this encounter: Meds ordered this encounter  Medications  . busPIRone (BUSPAR) 15 MG tablet    Sig: Take 1 tablet (15 mg total) by mouth 4 (four) times daily.    Dispense:  120 tablet    Refill:  2  . gabapentin (NEURONTIN) 100 MG capsule    Sig: Take 1 capsule (100 mg total) by mouth 4 (four) times daily.    Dispense:  120 capsule    Refill:  2  . FLUoxetine (PROZAC) 40 MG capsule    Sig: Take one capsule daily (65m) by mouth for anxiety and depression.    Dispense:  30 capsule    Refill:  2   Medical Decision Making Problem Points:  Established problem, stable/improving (1), Review of last therapy session (1) and Review of psycho-social stressors (1) Data Points:  Review or order clinical lab tests (1) Review of medication regiment & side effects (2)  I certify that outpatient services furnished can reasonably be expected to improve the patient's condition.   RLevonne Spiller MD

## 2013-11-19 ENCOUNTER — Other Ambulatory Visit: Payer: Self-pay | Admitting: Nurse Practitioner

## 2013-12-01 ENCOUNTER — Other Ambulatory Visit: Payer: Self-pay | Admitting: Nurse Practitioner

## 2013-12-20 ENCOUNTER — Other Ambulatory Visit: Payer: Self-pay | Admitting: Nurse Practitioner

## 2014-01-11 ENCOUNTER — Other Ambulatory Visit: Payer: Self-pay | Admitting: Family Medicine

## 2014-01-14 ENCOUNTER — Ambulatory Visit (INDEPENDENT_AMBULATORY_CARE_PROVIDER_SITE_OTHER): Payer: Medicare Other | Admitting: Family

## 2014-01-14 ENCOUNTER — Encounter: Payer: Self-pay | Admitting: Family

## 2014-01-14 VITALS — BP 147/65 | HR 42 | Temp 97.0°F | Ht 61.0 in | Wt 148.4 lb

## 2014-01-14 DIAGNOSIS — Z23 Encounter for immunization: Secondary | ICD-10-CM | POA: Diagnosis not present

## 2014-01-14 DIAGNOSIS — E559 Vitamin D deficiency, unspecified: Secondary | ICD-10-CM | POA: Diagnosis not present

## 2014-01-14 DIAGNOSIS — F419 Anxiety disorder, unspecified: Secondary | ICD-10-CM

## 2014-01-14 DIAGNOSIS — I10 Essential (primary) hypertension: Secondary | ICD-10-CM | POA: Diagnosis not present

## 2014-01-14 DIAGNOSIS — F3289 Other specified depressive episodes: Secondary | ICD-10-CM | POA: Diagnosis not present

## 2014-01-14 DIAGNOSIS — F329 Major depressive disorder, single episode, unspecified: Secondary | ICD-10-CM | POA: Diagnosis not present

## 2014-01-14 DIAGNOSIS — F331 Major depressive disorder, recurrent, moderate: Secondary | ICD-10-CM

## 2014-01-14 DIAGNOSIS — E785 Hyperlipidemia, unspecified: Secondary | ICD-10-CM | POA: Diagnosis not present

## 2014-01-14 DIAGNOSIS — F411 Generalized anxiety disorder: Secondary | ICD-10-CM | POA: Diagnosis not present

## 2014-01-14 DIAGNOSIS — F32A Depression, unspecified: Secondary | ICD-10-CM

## 2014-01-14 MED ORDER — ATORVASTATIN CALCIUM 40 MG PO TABS: ORAL_TABLET | ORAL | Status: DC

## 2014-01-14 MED ORDER — FLUOXETINE HCL 40 MG PO CAPS: ORAL_CAPSULE | ORAL | Status: DC

## 2014-01-14 MED ORDER — GABAPENTIN 100 MG PO CAPS: 100.0000 mg | ORAL_CAPSULE | Freq: Two times a day (BID) | ORAL | Status: DC

## 2014-01-14 MED ORDER — BUSPIRONE HCL 15 MG PO TABS: 15.0000 mg | ORAL_TABLET | Freq: Two times a day (BID) | ORAL | Status: DC

## 2014-01-14 MED ORDER — ATENOLOL 25 MG PO TABS: ORAL_TABLET | ORAL | Status: DC

## 2014-01-14 NOTE — Telephone Encounter (Signed)
Last lipids 12/14. ntbs for further refills.

## 2014-01-14 NOTE — Patient Instructions (Signed)

## 2014-01-14 NOTE — Progress Notes (Addendum)
Subjective:    Patient ID: Alexandria Chen, female    DOB: Dec 30, 1934, 79 y.o.   MRN: [id number]  Hypertension This is a chronic problem. The current episode started more than 1 year ago. The problem has been waxing and waning since onset. The problem is uncontrolled. Associated symptoms include anxiety and shortness of breath ("at times"). Pertinent negatives include no headaches, palpitations or peripheral edema. Risk factors for coronary artery disease include dyslipidemia, post-menopausal state and sedentary lifestyle. Past treatments include beta blockers. The current treatment provides mild improvement. There is no history of kidney disease, CAD/MI, CVA, heart failure or a thyroid problem. There is no history of sleep apnea.  Hyperlipidemia This is a chronic problem. The current episode started more than 1 year ago. The problem is controlled. Recent lipid tests were reviewed and are normal. She has no history of diabetes or hypothyroidism. Factors aggravating her hyperlipidemia include fatty foods. Associated symptoms include shortness of breath ("at times"). Current antihyperlipidemic treatment includes statins. The current treatment provides moderate improvement of lipids. Risk factors for coronary artery disease include dyslipidemia, hypertension and post-menopausal.  Anxiety Presents for follow-up visit. Symptoms include depressed mood, dizziness and shortness of breath ("at times"). Patient reports no insomnia, irritability, nervous/anxious behavior or palpitations. Symptoms occur rarely.   Her past medical history is significant for anxiety/panic attacks and depression. Past treatments include SSRIs. The treatment provided moderate relief. Compliance with prior treatments has been good.  Dizziness This is a chronic problem. The current episode started more than 1 year ago. The problem occurs intermittently. The problem has been waxing and waning. Pertinent negatives include no headaches. She  has tried rest and position changes (Antivert) for the symptoms. The treatment provided moderate relief.      Review of Systems  Constitutional: Negative.  Negative for irritability.  HENT: Negative.   Eyes: Negative.   Respiratory: Positive for shortness of breath ("at times").   Cardiovascular: Negative.  Negative for palpitations.  Gastrointestinal: Negative.   Endocrine: Negative.   Genitourinary: Negative.   Musculoskeletal: Negative.   Neurological: Positive for dizziness. Negative for headaches.  Hematological: Negative.   Psychiatric/Behavioral: Negative.  The patient is not nervous/anxious and does not have insomnia.   All other systems reviewed and are negative.      Objective:   Physical Exam  Vitals reviewed. Constitutional: She is oriented to person, place, and time. She appears well-developed and well-nourished. No distress.  HENT:  Head: Normocephalic and atraumatic.  Right Ear: External ear normal.  Left Ear: External ear normal.  Nose: Nose normal.  Mouth/Throat: Oropharynx is clear and moist.  Eyes: Pupils are equal, round, and reactive to light.  Neck: Normal range of motion. Neck supple. No thyromegaly present.  Cardiovascular: Normal rate, regular rhythm, normal heart sounds and intact distal pulses.   No murmur heard. Pulmonary/Chest: Effort normal and breath sounds normal. No respiratory distress. She has no wheezes.  Abdominal: Soft. Bowel sounds are normal. She exhibits no distension. There is no tenderness.  Musculoskeletal: Normal range of motion. She exhibits no edema and no tenderness.  Neurological: She is alert and oriented to person, place, and time. She has normal reflexes. No cranial nerve deficit.  Skin: Skin is warm and dry.  Psychiatric: She has a normal mood and affect. Her behavior is normal. Judgment and thought content normal.    BP 147/65  Pulse 42  Temp(Src) 97 F (36.1 C) (Oral)  Ht _0  (1.549 m)  Wt 148 lb 6.4  oz (67.314  kg)  BMI 28.05 kg/m2       Assessment & Plan:  1. Depression - CMP14+EGFR  2. Anxiety - CMP14+EGFR - gabapentin (NEURONTIN) 100 MG capsule; Take 1 capsule (100 mg total) by mouth 2 (two) times daily.  Dispense: 180 capsule; Refill: 3  3. Essential hypertension, benign - CMP14+EGFR - atenolol (TENORMIN) 25 MG tablet; TAKE 1 TABLET (25 MG TOTAL) BY MOUTH DAILY.  Dispense: 90 tablet; Refill: 4  4. Hyperlipidemia - CMP14+EGFR - Lipid panel - atorvastatin (LIPITOR) 40 MG tablet; TAKE 1 TABLET (40 MG TOTAL) BY MOUTH DAILY. FOR HYPERLIPIDEMIA  Dispense: 90 tablet; Refill: 4  5. Unspecified vitamin D deficiency - CMP14+EGFR - Vit D  25 hydroxy (rtn osteoporosis monitoring)  6. Major depressive disorder, recurrent episode, moderate - CMP14+EGFR  7. Need for vaccination with 13-polyvalent pneumococcal conjugate vaccine - Pneumococcal conjugate vaccine 13-valent IM   Continue all meds Labs pending Health Maintenance reviewed Diet and exercise encouraged RTO 6 months  Evelina Dun, FNP

## 2014-01-16 LAB — CMP14+EGFR
ALT: 10 IU/L (ref 0–32)
AST: 15 IU/L (ref 0–40)
Albumin/Globulin Ratio: 1.5 (ref 1.1–2.5)
Albumin: 4 g/dL (ref 3.5–4.8)
Alkaline Phosphatase: 83 IU/L (ref 39–117)
BUN / CREAT RATIO: 18 (ref 11–26)
BUN: 14 mg/dL (ref 8–27)
CHLORIDE: 101 mmol/L (ref 97–108)
CO2: 26 mmol/L (ref 18–29)
Calcium: 9.4 mg/dL (ref 8.7–10.3)
Creatinine, Ser: 0.78 mg/dL (ref 0.57–1.00)
GFR calc Af Amer: 85 mL/min/{1.73_m2} (ref >59)
GFR calc non Af Amer: 74 mL/min/{1.73_m2} (ref >59)
Globulin, Total: 2.7 g/dL (ref 1.5–4.5)
Glucose: 89 mg/dL (ref 65–99)
POTASSIUM: 4.2 mmol/L (ref 3.5–5.2)
Sodium: 140 mmol/L (ref 134–144)
Total Bilirubin: 0.4 mg/dL (ref 0.0–1.2)
Total Protein: 6.7 g/dL (ref 6.0–8.5)

## 2014-01-16 LAB — LIPID PANEL
Chol/HDL Ratio: 3.7 ratio units (ref 0.0–4.4)
Cholesterol, Total: 163 mg/dL (ref 100–199)
HDL: 44 mg/dL (ref >39)
LDL Calculated: 103 mg/dL — ABNORMAL HIGH (ref 0–99)
TRIGLYCERIDES: 79 mg/dL (ref 0–149)
VLDL Cholesterol Cal: 16 mg/dL (ref 5–40)

## 2014-01-16 LAB — VITAMIN D 25 HYDROXY (VIT D DEFICIENCY, FRACTURES): Vit D, 25-Hydroxy: 35.8 ng/mL (ref 30.0–100.0)

## 2014-01-18 ENCOUNTER — Ambulatory Visit (INDEPENDENT_AMBULATORY_CARE_PROVIDER_SITE_OTHER): Payer: Medicare Other | Admitting: Psychiatry

## 2014-01-18 ENCOUNTER — Encounter (HOSPITAL_COMMUNITY): Payer: Self-pay | Admitting: Psychiatry

## 2014-01-18 ENCOUNTER — Other Ambulatory Visit: Payer: Self-pay | Admitting: Family

## 2014-01-18 VITALS — BP 149/76 | HR 55 | Ht 61.0 in | Wt 146.6 lb

## 2014-01-18 DIAGNOSIS — F1010 Alcohol abuse, uncomplicated: Secondary | ICD-10-CM

## 2014-01-18 DIAGNOSIS — F329 Major depressive disorder, single episode, unspecified: Secondary | ICD-10-CM

## 2014-01-18 DIAGNOSIS — F331 Major depressive disorder, recurrent, moderate: Secondary | ICD-10-CM

## 2014-01-18 DIAGNOSIS — F32A Depression, unspecified: Secondary | ICD-10-CM

## 2014-01-18 DIAGNOSIS — F419 Anxiety disorder, unspecified: Secondary | ICD-10-CM

## 2014-01-18 DIAGNOSIS — F411 Generalized anxiety disorder: Secondary | ICD-10-CM

## 2014-01-18 MED ORDER — BUSPIRONE HCL 15 MG PO TABS: 15.0000 mg | ORAL_TABLET | Freq: Two times a day (BID) | ORAL | Status: DC

## 2014-01-18 MED ORDER — FLUOXETINE HCL 40 MG PO CAPS: ORAL_CAPSULE | ORAL | Status: DC

## 2014-01-18 NOTE — Progress Notes (Signed)
Patient ID: Alexandria Chen, female   DOB: Jul 20, 1934, 79 y.o.   MRN: [id number] Patient ID: Alexandria Chen, female   DOB: [birth year], 79 y.o.   MRN: [id number] Patient ID: Alexandria Chen, female   DOB: [birth year], 79 y.o.   MRN: [id number] Patient ID: Alexandria Chen, female   DOB: 08/01/1934, 79 y.o.   MRN: [id number] Patient ID: Alexandria Chen, female   DOB: [birth year], 79 y.o.   MRN: [id number] [facility] [id number] Progress Note DWAYNE BEGAY MRN: [id number] DOB: 1934-10-22 Age: 79 y.o.  Date: 01/18/2014 Start Time: 10:40 AM End Time: 10:59 AM  Chief Complaint: Chief Complaint  Patient presents with  . Anxiety  . Depression  . Follow-up   Subjective:  I'm doing ok"  This patient is a 79 year old widowed black female who lives with her 109 year old grandson in [place]. She is a retired Engineer, materials.  The patient has had a long history of depression anxiety. She's always been an anxious person and thinks she inherited this trait from her mother. She used to abuse alcohol but had stopped for several years until a relapse last year. She was hospitalized in 2013 for alcohol relapse and has not used since. Her 33 year old mother lives across the street and calls her constantly and this makes her nervous. She does state that the addition of Neurontin and BuSpar helped considerably with her anxiety. In general her mood is good, her energy is good and she is sleeping well. She denies any suicidal ideation  Patient returns after 3 months. She is doing ok. Her mood has been stable and she is only a little anxious. She still misses her mother who used to live across the street. Her grandson and his daughter are living with her and sometimes this can be too much for her. She denies suicidal ideation  Current psychiatric medication BuSpar 15 mg 4 times a day Neurontin 100 mg four times a day Prozac 40 mg daily.  LIFE STYLE CHANGE: Walking now up to 30 minutes a day. Having a garden again this  year.  Vitals: BP 149/76  Pulse 55  Ht '5\' 1"'  (1.549 m)  Wt 146 lb 9.6 oz (66.497 kg)  BMI 27.71 kg/m2  Past psychiatric history Patient was  admitted to behavioral [place] in June 2013 due to relapse into her alcohol.  She has significant history of depression and alcohol abuse.  She has at least 2 other psychiatric admission at behavioral [facility] which are exacerbated by alcohol intake.   Patient had a good response with Prozac and BuSpar.  She was taking benzodiazepine in the past however due to history of alcohol it was discontinued.  Patient denies any history of paranoia or psychosis.  Allergies: No Known Allergies Medical History: Past Medical History  Diagnosis Date  . HTN (hypertension)   . Depression   . Anxiety   . Alcohol abuse, in remission   Hypertension.  Patient sees Dr. Ronnald Collum in [place].    Surgical History: No past surgical history on file. Family History: family history includes Alcohol abuse in her brother and father; Anxiety disorder in her mother; Dementia in her mother. There is no history of ADD / ADHD, Drug abuse, Bipolar disorder, Depression, OCD, Paranoid behavior, Schizophrenia, Seizures, Sexual abuse, or Physical abuse. Reviewed and no changes noted today.  Psychosocial history Patient lives by herself however his grandson stays sometime with her.  Patient has 4 daughter and one son.  2  of her daughter lives close by.      Alcohol and Substance use history The patient has significant history of drinking alcohol for more than 30 years.  She  has been admitted few times due to relapse into drinking and worsening of depression .  Patient claims to be sober since she released from the hospital.    Mental status examination.  Patient is casually dressed.  She is pleasant cooperative and maintained good eye contact.  Her attention and concentration is  good.  Her speech is clear and coherent.  Her thought process is also  logical and goal-directed.  She described her mood as fairly good and her affect is mood appropriate.  She denies any active or passive suicidal thoughts or homicidal thoughts.  There were no paranoia or delusion present at this time.  There were no tremors or shakes present.  Her attention concentration is good.  She's alert oriented x3.  There were no flight of idea or loose association.  Her insight judgment and pulse control is better.  Lab Results:  Results for orders placed in visit on 01/14/14 (from the past 8736 hour(s))  CMP14+EGFR   Collection Time    01/14/14 11:19 AM      Result Value Ref Range   Glucose 89  65 - 99 mg/dL   BUN 14  8 - 27 mg/dL   Creatinine, Ser 0.78  0.57 - 1.00 mg/dL   GFR calc non Af Amer 74  >59 mL/min/1.73   GFR calc Af Amer 85  >59 mL/min/1.73   BUN/Creatinine Ratio 18  11 - 26   Sodium 140  134 - 144 mmol/L   Potassium 4.2  3.5 - 5.2 mmol/L   Chloride 101  97 - 108 mmol/L   CO2 26  18 - 29 mmol/L   Calcium 9.4  8.7 - 10.3 mg/dL   Total Protein 6.7  6.0 - 8.5 g/dL   Albumin 4.0  3.5 - 4.8 g/dL   Globulin, Total 2.7  1.5 - 4.5 g/dL   Albumin/Globulin Ratio 1.5  1.1 - 2.5   Total Bilirubin 0.4  0.0 - 1.2 mg/dL   Alkaline Phosphatase 83  39 - 117 IU/L   AST 15  0 - 40 IU/L   ALT 10  0 - 32 IU/L  LIPID PANEL   Collection Time    01/14/14 11:19 AM      Result Value Ref Range   Cholesterol, Total 163  100 - 199 mg/dL   Triglycerides 79  0 - 149 mg/dL   HDL 44  >39 mg/dL   VLDL Cholesterol Cal 16  5 - 40 mg/dL   LDL Calculated 103 (*) 0 - 99 mg/dL   Chol/HDL Ratio 3.7  0.0 - 4.4 ratio units  VITAMIN D 25 HYDROXY   Collection Time    01/14/14 11:19 AM      Result Value Ref Range   Vit D, 25-Hydroxy 35.8  30.0 - 100.0 ng/mL  Results for orders placed in visit on 07/25/13 (from the past 8736 hour(s))  BMP8+EGFR   Collection Time    07/25/13 10:19 AM      Result Value Ref Range   Glucose 97  65 - 99 mg/dL   BUN 16  8 - 27 mg/dL   Creatinine,  Ser 0.90  0.57 - 1.00 mg/dL   GFR calc non Af Amer 62  >59 mL/min/1.73   GFR calc Af Amer 72  >59 mL/min/1.73   BUN/Creatinine  Ratio 18  11 - 26   Sodium 139  134 - 144 mmol/L   Potassium 4.1  3.5 - 5.2 mmol/L   Chloride 100  97 - 108 mmol/L   CO2 25  18 - 29 mmol/L   Calcium 9.5  8.7 - 10.3 mg/dL  HEPATIC FUNCTION PANEL   Collection Time    07/25/13 10:19 AM      Result Value Ref Range   Total Protein 6.9  6.0 - 8.5 g/dL   Albumin 4.1  3.5 - 4.8 g/dL   Total Bilirubin 0.4  0.0 - 1.2 mg/dL   Bilirubin, Direct 0.11  0.00 - 0.40 mg/dL   Alkaline Phosphatase 74  39 - 117 IU/L   AST 21  0 - 40 IU/L   ALT 11  0 - 32 IU/L  NMR, LIPOPROFILE   Collection Time    07/25/13 10:19 AM      Result Value Ref Range   LDL Particle Number [id number] (*) <1000 nmol/L   LDLC SERPL CALC-MCNC 100 (*) <100 mg/dL   HDL Cholesterol by NMR 44  >=40 mg/dL   Triglycerides by NMR 75  <150 mg/dL   Cholesterol 159  <200 mg/dL   HDL Particle Number [id number] (*) >=30.5 umol/L   Small LDL Particle Number [id number]  <=527 nmol/L   LDL Size 21.4  >20.5 nm   LP-IR Score < 25  <= 45  VITAMIN D 25 HYDROXY   Collection Time    07/25/13 10:19 AM      Result Value Ref Range   Vit D, 25-Hydroxy 28.4 (*) 30.0 - 100.0 ng/mL  Results for orders placed in visit on 04/17/13 (from the past 8736 hour(s))  BMP8+EGFR   Collection Time    04/17/13  9:56 AM      Result Value Ref Range   Glucose 91  65 - 99 mg/dL   BUN 8  8 - 27 mg/dL   Creatinine, Ser 0.82  0.57 - 1.00 mg/dL   GFR calc non Af Amer 70  >59 mL/min/1.73   GFR calc Af Amer 80  >59 mL/min/1.73   BUN/Creatinine Ratio 10 (*) 11 - 26   Sodium 141  134 - 144 mmol/L   Potassium 4.4  3.5 - 5.2 mmol/L   Chloride 101  97 - 108 mmol/L   CO2 25  18 - 29 mmol/L   Calcium 9.7  8.6 - 10.2 mg/dL  NMR, LIPOPROFILE   Collection Time    04/17/13  9:56 AM      Result Value Ref Range   LDL Particle Number [id number] (*) <1000 nmol/L   LDLC SERPL CALC-MCNC 100 (*) <100 mg/dL   HDL  Cholesterol by NMR 44  >=40 mg/dL   Triglycerides by NMR 85  <150 mg/dL   Cholesterol 161  <200 mg/dL   HDL Particle Number [id number] (*) >=30.5 umol/L   Small LDL Particle Number [id number] (*) <=527 nmol/L   LDL Size 21.2  >20.5 nm   LP-IR Score 25  <=45  HEPATIC FUNCTION PANEL   Collection Time    04/17/13  9:56 AM      Result Value Ref Range   Total Protein 6.9  6.0 - 8.5 g/dL   Albumin 4.1  3.5 - 4.8 g/dL   Total Bilirubin 0.4  0.0 - 1.2 mg/dL   Bilirubin, Direct 0.13  0.00 - 0.40 mg/dL   Alkaline Phosphatase 71  39 - 117 IU/L   AST 18  0 - 40  IU/L   ALT 16  0 - 32 IU/L  VITAMIN D 25 HYDROXY   Collection Time    04/17/13  9:56 AM      Result Value Ref Range   Vit D, 25-Hydroxy 39.7  30.0 - 100.0 ng/mL  PCP draws routine labs and nothing is emerging as of concern.  Assessment.  Axis I Maj.  Depressive disorder , alcohol abuse.  anxiety disorder NOS  Axis II deferred Axis III hypertension  Axis IV moderate Axis V 60-65  Plan/Discussion: I took her vitals.  I reviewed CC, tobacco/med/surg Hx, meds effects/ side effects, problem list, therapies and responses as well as current situation/symptoms discussed options. Continue current effective medications. She'll return in 3 months but if her depression worsens she will call us See orders and pt instructions for more details.  MEDICATIONS this encounter: Meds ordered this encounter  Medications  . FLUoxetine (PROZAC) 40 MG capsule    Sig: Take one capsule daily (38m) by mouth for anxiety and depression.    Dispense:  90 capsule    Refill:  3  . busPIRone (BUSPAR) 15 MG tablet    Sig: Take 1 tablet (15 mg total) by mouth 2 (two) times daily.    Dispense:  180 tablet    Refill:  3   Medical Decision Making Problem Points:  Established problem, stable/improving (1), Review of last therapy session (1) and Review of psycho-social stressors (1) Data Points:  Review or order clinical lab tests (1) Review of medication regiment & side  effects (2)  I certify that outpatient services furnished can reasonably be expected to improve the patient's condition.   RLevonne Spiller MD

## 2014-01-23 DIAGNOSIS — Z23 Encounter for immunization: Secondary | ICD-10-CM | POA: Diagnosis not present

## 2014-02-03 ENCOUNTER — Other Ambulatory Visit: Payer: Self-pay | Admitting: Nurse Practitioner

## 2014-03-05 ENCOUNTER — Telehealth (HOSPITAL_COMMUNITY): Payer: Self-pay | Admitting: *Deleted

## 2014-04-04 ENCOUNTER — Other Ambulatory Visit: Payer: Self-pay | Admitting: Nurse Practitioner

## 2014-04-05 NOTE — Telephone Encounter (Signed)
Patient of MMM. Please advise

## 2014-04-18 ENCOUNTER — Encounter (HOSPITAL_COMMUNITY): Payer: Self-pay | Admitting: Psychiatry

## 2014-04-18 ENCOUNTER — Ambulatory Visit (HOSPITAL_COMMUNITY): Payer: Self-pay | Admitting: Psychiatry

## 2014-04-18 ENCOUNTER — Telehealth (HOSPITAL_COMMUNITY): Payer: Self-pay | Admitting: *Deleted

## 2014-04-18 ENCOUNTER — Ambulatory Visit (INDEPENDENT_AMBULATORY_CARE_PROVIDER_SITE_OTHER): Payer: Medicare Other | Admitting: Psychiatry

## 2014-04-18 VITALS — BP 156/84 | HR 58 | Ht 61.0 in | Wt 148.0 lb

## 2014-04-18 DIAGNOSIS — F331 Major depressive disorder, recurrent, moderate: Secondary | ICD-10-CM | POA: Diagnosis not present

## 2014-04-18 DIAGNOSIS — F32A Depression, unspecified: Secondary | ICD-10-CM

## 2014-04-18 DIAGNOSIS — F411 Generalized anxiety disorder: Secondary | ICD-10-CM | POA: Diagnosis not present

## 2014-04-18 DIAGNOSIS — F329 Major depressive disorder, single episode, unspecified: Secondary | ICD-10-CM | POA: Diagnosis not present

## 2014-04-18 DIAGNOSIS — F419 Anxiety disorder, unspecified: Secondary | ICD-10-CM

## 2014-04-18 MED ORDER — FLUOXETINE HCL 40 MG PO CAPS: ORAL_CAPSULE | ORAL | Status: DC

## 2014-04-18 MED ORDER — BUSPIRONE HCL 15 MG PO TABS: 15.0000 mg | ORAL_TABLET | Freq: Two times a day (BID) | ORAL | Status: DC

## 2014-04-18 MED ORDER — GABAPENTIN 100 MG PO CAPS: 100.0000 mg | ORAL_CAPSULE | Freq: Two times a day (BID) | ORAL | Status: DC

## 2014-04-18 NOTE — Telephone Encounter (Signed)
Pt called yesterday and states she will not be able to come to appt today due to it being close to christmas and she just can not make it. Per pt, she need refills for her Buspar. She only have 10 tablets left. Pt medication was last filled 01-18-14 with 180 tablets and 3 refills. Pt number [phone].

## 2014-04-18 NOTE — Progress Notes (Signed)
Patient ID: DEWANDA FENNEMA, female   DOB: 1934-09-14, 79 y.o.   MRN: [id number] Patient ID: MYLES MALLICOAT, female   DOB: 11-Dec-1934, 79 y.o.   MRN: [id number] Patient ID: LYDIAH PONG, female   DOB: 1935-02-28, 79 y.o.   MRN: [id number] Patient ID: NICKOLETTE ESPINOLA, female   DOB: [birth year], 79 y.o.   MRN: [id number] Patient ID: ERUM CERCONE, female   DOB: [birth year], 79 y.o.   MRN: [id number] Patient ID: SHAVONTA GOSSEN, female   DOB: 08/27/1934, 79 y.o.   MRN: [id number] [facility] [address] Progress Note SAANVI HAKALA MRN: [id number] DOB: August 06, 1934 Age: 79 y.o.  Date: 04/18/2014 Start Time: 10:40 AM End Time: 10:59 AM  Chief Complaint: Chief Complaint  Patient presents with  . Depression  . Anxiety   Subjective:  I'm doing ok"  This patient is a 79 year old widowed black female who lives with her 4 year old grandson in [place]. She is a retired Engineer, materials.  The patient has had a long history of depression anxiety. She's always been an anxious person and thinks she inherited this trait from her mother. She used to abuse alcohol but had stopped for several years until a relapse last year. She was hospitalized in 2013 for alcohol relapse and has not used since. Her 33 year old mother lives across the street and calls her constantly and this makes her nervous. She does state that the addition of Neurontin and BuSpar helped considerably with her anxiety. In general her mood is good, her energy is good and she is sleeping well. She denies any suicidal ideation  Patient returns after 3 months. She is doing ok. Her mood has been stable and she is only a little anxious. She still lives with her grandson and his daughter. Sometimes she and her grandson have arguments but for the most part the getting along well. She's had some episodes of dizziness and is now on Antivert. She denies any other complaints. She still misses her mom who passed away earlier in the year  Current psychiatric  medication BuSpar 15 mg 4 times a day Neurontin 100 mg twice a day Prozac 40 mg daily.  LIFE STYLE CHANGE: Walking now up to 30 minutes a day. Having a garden again this year.  Vitals: BP 156/84 mmHg  Pulse 58  Ht _0  (1.549 m)  Wt 148 lb (67.132 kg)  BMI 27.98 kg/m2  Past psychiatric history Patient was  admitted to behavioral [place] in June 2013 due to relapse into her alcohol.  She has significant history of depression and alcohol abuse.  She has at least 2 other psychiatric admission at behavioral [facility] which are exacerbated by alcohol intake.   Patient had a good response with Prozac and BuSpar.  She was taking benzodiazepine in the past however due to history of alcohol it was discontinued.  Patient denies any history of paranoia or psychosis.  Allergies: No Known Allergies Medical History: Past Medical History  Diagnosis Date  . HTN (hypertension)   . Depression   . Anxiety   . Alcohol abuse, in remission   Hypertension.  Patient sees Dr. Ronnald Collum in [place].    Surgical History: History reviewed. No pertinent past surgical history. Family History: family history includes Alcohol abuse in her brother and father; Anxiety disorder in her mother; Dementia in her mother. There is no history of ADD / ADHD, Drug abuse, Bipolar disorder, Depression, OCD, Paranoid behavior, Schizophrenia, Seizures, Sexual abuse, or Physical  abuse. Reviewed and no changes noted today.  Psychosocial history Patient lives by herself however his grandson stays sometime with her.  Patient has 4 daughter and one son.  2 of her daughter lives close by.      Alcohol and Substance use history The patient has significant history of drinking alcohol for more than 30 years.  She  has been admitted few times due to relapse into drinking and worsening of depression .  Patient claims to be sober since she released from the hospital.    Mental status examination.  Patient is  casually dressed.  She is pleasant cooperative and maintained good eye contact.  Her attention and concentration is  good.  Her speech is clear and coherent.  Her thought process is also logical and goal-directed.  She described her mood as fairly good and her affect is mood appropriate.  She denies any active or passive suicidal thoughts or homicidal thoughts.  There were no paranoia or delusion present at this time.  There were no tremors or shakes present.  Her attention concentration is good.  She's alert oriented x3.  There were no flight of idea or loose association.  Her insight judgment and pulse control is better.  Lab Results:  Results for orders placed or performed in visit on 01/14/14 (from the past 8736 hour(s))  CMP14+EGFR   Collection Time: 01/14/14 11:19 AM  Result Value Ref Range   Glucose 89 65 - 99 mg/dL   BUN 14 8 - 27 mg/dL   Creatinine, Ser 0.78 0.57 - 1.00 mg/dL   GFR calc non Af Amer 74 >59 mL/min/1.73   GFR calc Af Amer 85 >59 mL/min/1.73   BUN/Creatinine Ratio 18 11 - 26   Sodium 140 134 - 144 mmol/L   Potassium 4.2 3.5 - 5.2 mmol/L   Chloride 101 97 - 108 mmol/L   CO2 26 18 - 29 mmol/L   Calcium 9.4 8.7 - 10.3 mg/dL   Total Protein 6.7 6.0 - 8.5 g/dL   Albumin 4.0 3.5 - 4.8 g/dL   Globulin, Total 2.7 1.5 - 4.5 g/dL   Albumin/Globulin Ratio 1.5 1.1 - 2.5   Total Bilirubin 0.4 0.0 - 1.2 mg/dL   Alkaline Phosphatase 83 39 - 117 IU/L   AST 15 0 - 40 IU/L   ALT 10 0 - 32 IU/L  Lipid panel   Collection Time: 01/14/14 11:19 AM  Result Value Ref Range   Cholesterol, Total 163 100 - 199 mg/dL   Triglycerides 79 0 - 149 mg/dL   HDL 44 >39 mg/dL   VLDL Cholesterol Cal 16 5 - 40 mg/dL   LDL Calculated 103 (H) 0 - 99 mg/dL   Chol/HDL Ratio 3.7 0.0 - 4.4 ratio units  Vit D  25 hydroxy (rtn osteoporosis monitoring)   Collection Time: 01/14/14 11:19 AM  Result Value Ref Range   Vit D, 25-Hydroxy 35.8 30.0 - 100.0 ng/mL  Results for orders placed or performed in visit  on 07/25/13 (from the past 8736 hour(s))  BMP8+EGFR   Collection Time: 07/25/13 10:19 AM  Result Value Ref Range   Glucose 97 65 - 99 mg/dL   BUN 16 8 - 27 mg/dL   Creatinine, Ser 0.90 0.57 - 1.00 mg/dL   GFR calc non Af Amer 62 >59 mL/min/1.73   GFR calc Af Amer 72 >59 mL/min/1.73   BUN/Creatinine Ratio 18 11 - 26   Sodium 139 134 - 144 mmol/L   Potassium 4.1 3.5 -  5.2 mmol/L   Chloride 100 97 - 108 mmol/L   CO2 25 18 - 29 mmol/L   Calcium 9.5 8.7 - 10.3 mg/dL  Hepatic function panel   Collection Time: 07/25/13 10:19 AM  Result Value Ref Range   Total Protein 6.9 6.0 - 8.5 g/dL   Albumin 4.1 3.5 - 4.8 g/dL   Total Bilirubin 0.4 0.0 - 1.2 mg/dL   Bilirubin, Direct 0.11 0.00 - 0.40 mg/dL   Alkaline Phosphatase 74 39 - 117 IU/L   AST 21 0 - 40 IU/L   ALT 11 0 - 32 IU/L  NMR, lipoprofile   Collection Time: 07/25/13 10:19 AM  Result Value Ref Range   LDL Particle Number [id number] (H) <1000 nmol/L   LDLC SERPL CALC-MCNC 100 (H) <100 mg/dL   HDL Cholesterol by NMR 44 >=40 mg/dL   Triglycerides by NMR 75 <150 mg/dL   Cholesterol 159 <200 mg/dL   HDL Particle Number 23.4 (L) >=30.5 umol/L   Small LDL Particle Number [id number] <=527 nmol/L   LDL Size 21.4 >20.5 nm   LP-IR Score < 25 <= 45  Vit D  25 hydroxy (rtn osteoporosis monitoring)   Collection Time: 07/25/13 10:19 AM  Result Value Ref Range   Vit D, 25-Hydroxy 28.4 (L) 30.0 - 100.0 ng/mL  PCP draws routine labs and nothing is emerging as of concern.  Assessment.  Axis I Maj.  Depressive disorder , alcohol abuse.  anxiety disorder NOS  Axis II deferred Axis III hypertension  Axis IV moderate Axis V 60-65  Plan/Discussion: I took her vitals.  I reviewed CC, tobacco/med/surg Hx, meds effects/ side effects, problem list, therapies and responses as well as current situation/symptoms discussed options. Continue current effective medications. She'll return in 3 months but if her depression worsens she will call us See orders and pt  instructions for more details.  MEDICATIONS this encounter: Meds ordered this encounter  Medications  . busPIRone (BUSPAR) 15 MG tablet    Sig: Take 1 tablet (15 mg total) by mouth 2 (two) times daily.    Dispense:  180 tablet    Refill:  3  . FLUoxetine (PROZAC) 40 MG capsule    Sig: Take one capsule daily (23m) by mouth for anxiety and depression.    Dispense:  90 capsule    Refill:  3  . gabapentin (NEURONTIN) 100 MG capsule    Sig: Take 1 capsule (100 mg total) by mouth 2 (two) times daily.    Dispense:  180 capsule    Refill:  3   Medical Decision Making Problem Points:  Established problem, stable/improving (1), Review of last therapy session (1) and Review of psycho-social stressors (1) Data Points:  Review or order clinical lab tests (1) Review of medication regiment & side effects (2)  I certify that outpatient services furnished can reasonably be expected to improve the patient's condition.   RLevonne Spiller MD

## 2014-04-18 NOTE — Telephone Encounter (Signed)
Pt came to appointment

## 2014-04-19 ENCOUNTER — Ambulatory Visit (HOSPITAL_COMMUNITY): Payer: Self-pay | Admitting: Psychiatry

## 2014-06-11 ENCOUNTER — Ambulatory Visit (INDEPENDENT_AMBULATORY_CARE_PROVIDER_SITE_OTHER): Payer: Medicare Other | Admitting: Family Medicine

## 2014-06-11 ENCOUNTER — Encounter: Payer: Self-pay | Admitting: Family Medicine

## 2014-06-11 VITALS — BP 127/74 | HR 58 | Temp 97.5°F | Ht 61.0 in | Wt 147.2 lb

## 2014-06-11 DIAGNOSIS — J069 Acute upper respiratory infection, unspecified: Secondary | ICD-10-CM

## 2014-06-11 DIAGNOSIS — I10 Essential (primary) hypertension: Secondary | ICD-10-CM | POA: Diagnosis not present

## 2014-06-11 LAB — POCT CBC
Granulocyte percent: 38.3 %G (ref 37–80)
HCT, POC: 40.3 % (ref 37.7–47.9)
Hemoglobin: 13.1 g/dL (ref 12.2–16.2)
Lymph, poc: 3.5 — AB (ref 0.6–3.4)
MCH, POC: 29.4 pg (ref 27–31.2)
MCHC: 32.6 g/dL (ref 31.8–35.4)
MCV: 90.3 fL (ref 80–97)
MPV: 6.9 fL (ref 0–99.8)
POC Granulocyte: 2.5 (ref 2–6.9)
POC LYMPH PERCENT: 53.1 %L — AB (ref 10–50)
Platelet Count, POC: 316 10*3/uL (ref 142–424)
RBC: 4.5 M/uL (ref 4.04–5.48)
RDW, POC: 14.3 %
WBC: 6.6 10*3/uL (ref 4.6–10.2)

## 2014-06-11 MED ORDER — AZITHROMYCIN 250 MG PO TABS: ORAL_TABLET | ORAL | Status: DC

## 2014-06-11 NOTE — Progress Notes (Signed)
   Subjective:    Patient ID: Alexandria Chen, female    DOB: [birth year], 80 y.o.   MRN: [id number]  HPI Patient is here with CC of congestion and uri sx's  Review of Systems  Constitutional: Negative for fever.  HENT: Negative for ear pain.   Eyes: Negative for discharge.  Respiratory: Negative for cough.   Cardiovascular: Negative for chest pain.  Gastrointestinal: Negative for abdominal distention.  Endocrine: Negative for polyuria.  Genitourinary: Negative for difficulty urinating.  Musculoskeletal: Negative for gait problem and neck pain.  Skin: Negative for color change and rash.  Neurological: Negative for speech difficulty and headaches.  Psychiatric/Behavioral: Negative for agitation.       Objective:    BP 127/74 mmHg  Pulse 58  Temp(Src) 97.5 F (36.4 C) (Oral)  Ht $R'5\' 1"'aQ$  (1.549 m)  Wt 147 lb 3.2 oz (66.769 kg)  BMI 27.83 kg/m2 Physical Exam  Constitutional: She is oriented to person, place, and time. She appears well-developed and well-nourished.  HENT:  Head: Normocephalic and atraumatic.  Mouth/Throat: Oropharynx is clear and moist.  Eyes: Pupils are equal, round, and reactive to light.  Neck: Normal range of motion. Neck supple.  Cardiovascular: Normal rate and regular rhythm.   No murmur heard. Pulmonary/Chest: Effort normal and breath sounds normal.  Abdominal: Soft. Bowel sounds are normal. There is no tenderness.  Neurological: She is alert and oriented to person, place, and time.  Skin: Skin is warm and dry.  Psychiatric: She has a normal mood and affect.          Assessment & Plan:     ICD-9-CM ICD-10-CM   1. Essential hypertension, benign 401.1 I10 POCT CBC     CMP14+EGFR  2. URI (upper respiratory infection) 465.9 J06.9 azithromycin (ZITHROMAX) 250 MG tablet     No Follow-up on file.  Lysbeth Penner FNP

## 2014-06-12 LAB — CMP14+EGFR
ALT: 12 IU/L (ref 0–32)
AST: 18 IU/L (ref 0–40)
Albumin/Globulin Ratio: 1.4 (ref 1.1–2.5)
Albumin: 4.2 g/dL (ref 3.5–4.8)
Alkaline Phosphatase: 83 IU/L (ref 39–117)
BUN/Creatinine Ratio: 25 (ref 11–26)
BUN: 19 mg/dL (ref 8–27)
Bilirubin Total: 0.4 mg/dL (ref 0.0–1.2)
CO2: 26 mmol/L (ref 18–29)
Calcium: 9.7 mg/dL (ref 8.7–10.3)
Chloride: 102 mmol/L (ref 97–108)
Creatinine, Ser: 0.77 mg/dL (ref 0.57–1.00)
GFR calc Af Amer: 86 mL/min/{1.73_m2} (ref >59)
GFR calc non Af Amer: 75 mL/min/{1.73_m2} (ref >59)
Globulin, Total: 3.1 g/dL (ref 1.5–4.5)
Glucose: 85 mg/dL (ref 65–99)
Potassium: 3.7 mmol/L (ref 3.5–5.2)
Sodium: 141 mmol/L (ref 134–144)
Total Protein: 7.3 g/dL (ref 6.0–8.5)

## 2014-06-20 ENCOUNTER — Telehealth: Payer: Self-pay | Admitting: Family Medicine

## 2014-06-20 NOTE — Telephone Encounter (Signed)
Pt aware results haven't been reviewed, will call pt once provider has reviewed.

## 2014-07-01 ENCOUNTER — Telehealth: Payer: Self-pay | Admitting: Family Medicine

## 2014-07-01 NOTE — Telephone Encounter (Signed)
Spoke with patient about labs

## 2014-07-04 ENCOUNTER — Other Ambulatory Visit: Payer: Self-pay | Admitting: Family Medicine

## 2014-07-18 ENCOUNTER — Encounter (HOSPITAL_COMMUNITY): Payer: Self-pay | Admitting: Psychiatry

## 2014-07-18 ENCOUNTER — Ambulatory Visit (INDEPENDENT_AMBULATORY_CARE_PROVIDER_SITE_OTHER): Payer: Medicare Other | Admitting: Psychiatry

## 2014-07-18 VITALS — BP 164/87 | HR 63 | Ht 61.0 in | Wt 147.0 lb

## 2014-07-18 DIAGNOSIS — F329 Major depressive disorder, single episode, unspecified: Secondary | ICD-10-CM

## 2014-07-18 DIAGNOSIS — F419 Anxiety disorder, unspecified: Secondary | ICD-10-CM

## 2014-07-18 DIAGNOSIS — F1010 Alcohol abuse, uncomplicated: Secondary | ICD-10-CM

## 2014-07-18 DIAGNOSIS — F331 Major depressive disorder, recurrent, moderate: Secondary | ICD-10-CM

## 2014-07-18 DIAGNOSIS — F32A Depression, unspecified: Secondary | ICD-10-CM

## 2014-07-18 MED ORDER — BUSPIRONE HCL 15 MG PO TABS: 15.0000 mg | ORAL_TABLET | Freq: Two times a day (BID) | ORAL | Status: DC

## 2014-07-18 MED ORDER — GABAPENTIN 100 MG PO CAPS: 100.0000 mg | ORAL_CAPSULE | Freq: Two times a day (BID) | ORAL | Status: DC

## 2014-07-18 MED ORDER — FLUOXETINE HCL 40 MG PO CAPS: ORAL_CAPSULE | ORAL | Status: DC

## 2014-07-18 NOTE — Progress Notes (Signed)
Patient ID: ROSALI AUGELLO, female   DOB: Sep 09, 1934, 80 y.o.   MRN: [id number] Patient ID: ISABELA NARDELLI, female   DOB: 21-Oct-1934, 80 y.o.   MRN: [id number] Patient ID: SABELLA TRAORE, female   DOB: 1934/05/26, 80 y.o.   MRN: [id number] Patient ID: KIRSTYN LEAN, female   DOB: [birth year], 80 y.o.   MRN: [id number] Patient ID: GWENEVERE GOGA, female   DOB: February 28, 1935, 80 y.o.   MRN: [id number] Patient ID: SIERRAH LUEVANO, female   DOB: January 04, 1935, 80 y.o.   MRN: [id number] Patient ID: CAY KATH, female   DOB: 10/09/1934, 80 y.o.   MRN: [id number] [facility] [address] Progress Note KHYLER URDA MRN: [id number] DOB: 05/01/1935 Age: 80 y.o.  Date: 07/18/2014 Start Time: 10:40 AM End Time: 10:59 AM  Chief Complaint: Chief Complaint  Patient presents with  . Depression  . Follow-up   Subjective:  I'm doing ok"  This patient is a 80 year old widowed black female who lives with her 15 year old grandson in [place]. She is a retired Engineer, materials.  The patient has had a long history of depression anxiety. She's always been an anxious person and thinks she inherited this trait from her mother. She used to abuse alcohol but had stopped for several years until a relapse last year. She was hospitalized in 2013 for alcohol relapse and has not used since. Her 51 year old mother lives across the street and calls her constantly and this makes her nervous. She does state that the addition of Neurontin and BuSpar helped considerably with her anxiety. In general her mood is good, her energy is good and she is sleeping well. She denies any suicidal ideation  Patient returns after 3 months. She is doing ok. Her mood has been stable but she gets anxious when she first gets up in the morning. This usually subsides when she takes her morning medications. She still having the dizzy spells but not as frequently. She staying active and getting out in her garden  Current psychiatric medication BuSpar 15 mg 4 times a  day Neurontin 100 mg twice a day Prozac 40 mg daily.  LIFE STYLE CHANGE: Walking now up to 30 minutes a day. Having a garden again this year.  Vitals: BP 164/87 mmHg  Pulse 63  Ht '5\' 1"'  (1.549 m)  Wt 147 lb (66.679 kg)  BMI 27.79 kg/m2  Past psychiatric history Patient was  admitted to behavioral [facility] in June 2013 due to relapse into her alcohol.  She has significant history of depression and alcohol abuse.  She has at least 2 other psychiatric admission at behavioral [facility] which are exacerbated by alcohol intake.   Patient had a good response with Prozac and BuSpar.  She was taking benzodiazepine in the past however due to history of alcohol it was discontinued.  Patient denies any history of paranoia or psychosis.  Allergies: No Known Allergies Medical History: Past Medical History  Diagnosis Date  . HTN (hypertension)   . Depression   . Anxiety   . Alcohol abuse, in remission   Hypertension.  Patient sees Dr. Ronnald Collum in [place].    Surgical History: History reviewed. No pertinent past surgical history. Family History: family history includes Alcohol abuse in her brother and father; Anxiety disorder in her mother; Dementia in her mother. There is no history of ADD / ADHD, Drug abuse, Bipolar disorder, Depression, OCD, Paranoid behavior, Schizophrenia, Seizures, Sexual abuse, or Physical abuse. Reviewed and no  changes noted today.  Psychosocial history Patient lives by herself however his grandson stays sometime with her.  Patient has 4 daughter and one son.  2 of her daughter lives close by.      Alcohol and Substance use history The patient has significant history of drinking alcohol for more than 30 years.  She  has been admitted few times due to relapse into drinking and worsening of depression .  Patient claims to be sober since she released from the hospital.    Mental status examination.  Patient is casually dressed.  She is pleasant  cooperative and maintained good eye contact.  Her attention and concentration is  good.  Her speech is clear and coherent.  Her thought process is also logical and goal-directed.  She described her mood as fairly good and her affect is mood appropriate.  She denies any active or passive suicidal thoughts or homicidal thoughts.  There were no paranoia or delusion present at this time.  There were no tremors or shakes present.  Her attention concentration is good.  She's alert oriented x3.  There were no flight of idea or loose association.  Her insight judgment and pulse control is better.  Lab Results:  Results for orders placed or performed in visit on 06/11/14 (from the past 8736 hour(s))  CMP14+EGFR   Collection Time: 06/11/14  4:38 PM  Result Value Ref Range   Glucose 85 65 - 99 mg/dL   BUN 19 8 - 27 mg/dL   Creatinine, Ser 0.77 0.57 - 1.00 mg/dL   GFR calc non Af Amer 75 >59 mL/min/1.73   GFR calc Af Amer 86 >59 mL/min/1.73   BUN/Creatinine Ratio 25 11 - 26   Sodium 141 134 - 144 mmol/L   Potassium 3.7 3.5 - 5.2 mmol/L   Chloride 102 97 - 108 mmol/L   CO2 26 18 - 29 mmol/L   Calcium 9.7 8.7 - 10.3 mg/dL   Total Protein 7.3 6.0 - 8.5 g/dL   Albumin 4.2 3.5 - 4.8 g/dL   Globulin, Total 3.1 1.5 - 4.5 g/dL   Albumin/Globulin Ratio 1.4 1.1 - 2.5   Bilirubin Total 0.4 0.0 - 1.2 mg/dL   Alkaline Phosphatase 83 39 - 117 IU/L   AST 18 0 - 40 IU/L   ALT 12 0 - 32 IU/L  POCT CBC   Collection Time: 06/11/14  4:48 PM  Result Value Ref Range   WBC 6.6 4.6 - 10.2 K/uL   Lymph, poc 3.5 (A) 0.6 - 3.4   POC LYMPH PERCENT 53.1 (A) 10 - 50 %L   POC Granulocyte 2.5 2 - 6.9   Granulocyte percent 38.3 37 - 80 %G   RBC 4.5 4.04 - 5.48 M/uL   Hemoglobin 13.1 12.2 - 16.2 g/dL   HCT, POC 40.3 37.7 - 47.9 %   MCV 90.3 80 - 97 fL   MCH, POC 29.4 27 - 31.2 pg   MCHC 32.6 31.8 - 35.4 g/dL   RDW, POC 14.3 %   Platelet Count, POC 316.0 142 - 424 K/uL   MPV 6.9 0 - 99.8 fL  Results for orders placed or  performed in visit on 01/14/14 (from the past 8736 hour(s))  CMP14+EGFR   Collection Time: 01/14/14 11:19 AM  Result Value Ref Range   Glucose 89 65 - 99 mg/dL   BUN 14 8 - 27 mg/dL   Creatinine, Ser 0.78 0.57 - 1.00 mg/dL   GFR calc non Af Amer 74 >  59 mL/min/1.73   GFR calc Af Amer 85 >59 mL/min/1.73   BUN/Creatinine Ratio 18 11 - 26   Sodium 140 134 - 144 mmol/L   Potassium 4.2 3.5 - 5.2 mmol/L   Chloride 101 97 - 108 mmol/L   CO2 26 18 - 29 mmol/L   Calcium 9.4 8.7 - 10.3 mg/dL   Total Protein 6.7 6.0 - 8.5 g/dL   Albumin 4.0 3.5 - 4.8 g/dL   Globulin, Total 2.7 1.5 - 4.5 g/dL   Albumin/Globulin Ratio 1.5 1.1 - 2.5   Total Bilirubin 0.4 0.0 - 1.2 mg/dL   Alkaline Phosphatase 83 39 - 117 IU/L   AST 15 0 - 40 IU/L   ALT 10 0 - 32 IU/L  Lipid panel   Collection Time: 01/14/14 11:19 AM  Result Value Ref Range   Cholesterol, Total 163 100 - 199 mg/dL   Triglycerides 79 0 - 149 mg/dL   HDL 44 >39 mg/dL   VLDL Cholesterol Cal 16 5 - 40 mg/dL   LDL Calculated 103 (H) 0 - 99 mg/dL   Chol/HDL Ratio 3.7 0.0 - 4.4 ratio units  Vit D  25 hydroxy (rtn osteoporosis monitoring)   Collection Time: 01/14/14 11:19 AM  Result Value Ref Range   Vit D, 25-Hydroxy 35.8 30.0 - 100.0 ng/mL  Results for orders placed or performed in visit on 07/25/13 (from the past 8736 hour(s))  BMP8+EGFR   Collection Time: 07/25/13 10:19 AM  Result Value Ref Range   Glucose 97 65 - 99 mg/dL   BUN 16 8 - 27 mg/dL   Creatinine, Ser 0.90 0.57 - 1.00 mg/dL   GFR calc non Af Amer 62 >59 mL/min/1.73   GFR calc Af Amer 72 >59 mL/min/1.73   BUN/Creatinine Ratio 18 11 - 26   Sodium 139 134 - 144 mmol/L   Potassium 4.1 3.5 - 5.2 mmol/L   Chloride 100 97 - 108 mmol/L   CO2 25 18 - 29 mmol/L   Calcium 9.5 8.7 - 10.3 mg/dL  Hepatic function panel   Collection Time: 07/25/13 10:19 AM  Result Value Ref Range   Total Protein 6.9 6.0 - 8.5 g/dL   Albumin 4.1 3.5 - 4.8 g/dL   Total Bilirubin 0.4 0.0 - 1.2 mg/dL    Bilirubin, Direct 0.11 0.00 - 0.40 mg/dL   Alkaline Phosphatase 74 39 - 117 IU/L   AST 21 0 - 40 IU/L   ALT 11 0 - 32 IU/L  NMR, lipoprofile   Collection Time: 07/25/13 10:19 AM  Result Value Ref Range   LDL Particle Number [id number] (H) <1000 nmol/L   LDLC SERPL CALC-MCNC 100 (H) <100 mg/dL   HDL Cholesterol by NMR 44 >=40 mg/dL   Triglycerides by NMR 75 <150 mg/dL   Cholesterol 159 <200 mg/dL   HDL Particle Number [id number] (L) >=30.5 umol/L   Small LDL Particle Number [id number] <=527 nmol/L   LDL Size 21.4 >20.5 nm   LP-IR Score < 25 <= 45  Vit D  25 hydroxy (rtn osteoporosis monitoring)   Collection Time: 07/25/13 10:19 AM  Result Value Ref Range   Vit D, 25-Hydroxy 28.4 (L) 30.0 - 100.0 ng/mL  PCP draws routine labs and nothing is emerging as of concern.  Assessment.  Axis I Maj.  Depressive disorder , alcohol abuse.  anxiety disorder NOS  Axis II deferred Axis III hypertension  Axis IV moderate Axis V 60-65  Plan/Discussion: I took her vitals.  I reviewed CC, tobacco/med/surg Hx,  meds effects/ side effects, problem list, therapies and responses as well as current situation/symptoms discussed options. Continue current effective medications. She'll return in 3 months but if her depression worsens she will call us See orders and pt instructions for more details.  MEDICATIONS this encounter: Meds ordered this encounter  Medications  . gabapentin (NEURONTIN) 100 MG capsule    Sig: Take 1 capsule (100 mg total) by mouth 2 (two) times daily.    Dispense:  180 capsule    Refill:  3  . busPIRone (BUSPAR) 15 MG tablet    Sig: Take 1 tablet (15 mg total) by mouth 2 (two) times daily.    Dispense:  180 tablet    Refill:  3  . FLUoxetine (PROZAC) 40 MG capsule    Sig: Take one capsule daily (80m) by mouth for anxiety and depression.    Dispense:  90 capsule    Refill:  3   Medical Decision Making Problem Points:  Established problem, stable/improving (1), Review of last therapy session  (1) and Review of psycho-social stressors (1) Data Points:  Review or order clinical lab tests (1) Review of medication regiment & side effects (2)  I certify that outpatient services furnished can reasonably be expected to improve the patient's condition.   RLevonne Spiller MD

## 2014-10-04 ENCOUNTER — Other Ambulatory Visit: Payer: Self-pay | Admitting: Family Medicine

## 2014-10-18 ENCOUNTER — Encounter (HOSPITAL_COMMUNITY): Payer: Self-pay | Admitting: Psychiatry

## 2014-10-18 ENCOUNTER — Ambulatory Visit (INDEPENDENT_AMBULATORY_CARE_PROVIDER_SITE_OTHER): Payer: Medicare Other | Admitting: Psychiatry

## 2014-10-18 VITALS — BP 156/75 | HR 63 | Ht 61.0 in | Wt 140.2 lb

## 2014-10-18 DIAGNOSIS — F419 Anxiety disorder, unspecified: Secondary | ICD-10-CM | POA: Diagnosis not present

## 2014-10-18 DIAGNOSIS — F329 Major depressive disorder, single episode, unspecified: Secondary | ICD-10-CM | POA: Diagnosis not present

## 2014-10-18 DIAGNOSIS — F32A Depression, unspecified: Secondary | ICD-10-CM

## 2014-10-18 DIAGNOSIS — F1010 Alcohol abuse, uncomplicated: Secondary | ICD-10-CM

## 2014-10-18 DIAGNOSIS — F331 Major depressive disorder, recurrent, moderate: Secondary | ICD-10-CM

## 2014-10-18 MED ORDER — FLUOXETINE HCL 40 MG PO CAPS: ORAL_CAPSULE | ORAL | Status: DC

## 2014-10-18 MED ORDER — GABAPENTIN 100 MG PO CAPS: 100.0000 mg | ORAL_CAPSULE | Freq: Two times a day (BID) | ORAL | Status: DC

## 2014-10-18 MED ORDER — BUSPIRONE HCL 15 MG PO TABS: 15.0000 mg | ORAL_TABLET | Freq: Three times a day (TID) | ORAL | Status: DC

## 2014-10-18 NOTE — Progress Notes (Signed)
Patient ID: Alexandria Chen, female   DOB: 05-05-1934, 80 y.o.   MRN: [id number] Patient ID: Alexandria Chen, female   DOB: 01-Jul-1934, 80 y.o.   MRN: [id number] Patient ID: Alexandria Chen, female   DOB: Apr 16, 1935, 80 y.o.   MRN: [id number] Patient ID: Alexandria Chen, female   DOB: 1935-04-22, 80 y.o.   MRN: [id number] Patient ID: Alexandria Chen, female   DOB: 07-09-1934, 80 y.o.   MRN: [id number] Patient ID: Alexandria Chen, female   DOB: 27-Jun-1934, 80 y.o.   MRN: [id number] Patient ID: Alexandria Chen, female   DOB: Dec 30, 1934, 80 y.o.   MRN: [id number] Patient ID: Alexandria Chen, female   DOB: 1934-07-29, 80 y.o.   MRN: [id number] [facility] [address] Progress Note Alexandria Chen MRN: [id number] DOB: 03/21/1935 Age: 80 y.o.  Date: 10/18/2014 Start Time: 10:40 AM End Time: 10:59 AM  Chief Complaint: Chief Complaint  Patient presents with  . Depression  . Anxiety  . Follow-up   Subjective:  "I've been nervous"  This patient is a 80 year old widowed black female who lives with her 78 year old grandson in [place]. She is a retired Engineer, materials.  The patient has had a long history of depression anxiety. She's always been an anxious person and thinks she inherited this trait from her mother. She used to abuse alcohol but had stopped for several years until a relapse last year. She was hospitalized in 2013 for alcohol relapse and has not used since. Her 78 year old mother lives across the street and calls her constantly and this makes her nervous. She does state that the addition of Neurontin and BuSpar helped considerably with her anxiety. In general her mood is good, her energy is good and she is sleeping well. She denies any suicidal ideation  Patient returns after 3 months. She is doing ok. Her her 78 year old grandson is living with her as well as her 78-year-old great granddaughter. She states that the child is well behaved but having a young child in the home makes her very nervous. She feels like she's on  edge all the time. She still misses her mom who used to live across the street. She sleeping and eating well and doesn't have any suicidal ideation or severe depression. I suggested we increase her BuSpar to 3 times a day and she agrees. She is not abusing drugs or alcohol  Current psychiatric medication BuSpar 15 mg 2 times a day Neurontin 100 mg twice a day Prozac 40 mg daily.  LIFE STYLE CHANGE: Walking now up to 30 minutes a day. Having a garden again this year.  Vitals: BP 156/75 mmHg  Pulse 63  Ht '5\' 1"'  (1.549 m)  Wt 140 lb 3.2 oz (63.594 kg)  BMI 26.50 kg/m2  Past psychiatric history Patient was  admitted to behavioral [facility] in June 2013 due to relapse into her alcohol.  She has significant history of depression and alcohol abuse.  She has at least 2 other psychiatric admission at behavioral [facility] which are exacerbated by alcohol intake.   Patient had a good response with Prozac and BuSpar.  She was taking benzodiazepine in the past however due to history of alcohol it was discontinued.  Patient denies any history of paranoia or psychosis.  Allergies: No Known Allergies Medical History: Past Medical History  Diagnosis Date  . HTN (hypertension)   . Depression   . Anxiety   . Alcohol abuse, in remission   Hypertension.  Patient  sees Dr. Ronnald Collum in [place].    Surgical History: History reviewed. No pertinent past surgical history. Family History: family history includes Alcohol abuse in her brother and father; Anxiety disorder in her mother; Dementia in her mother. There is no history of ADD / ADHD, Drug abuse, Bipolar disorder, Depression, OCD, Paranoid behavior, Schizophrenia, Seizures, Sexual abuse, or Physical abuse. Reviewed and no changes noted today.  Psychosocial history Patient lives by herself however his grandson stays sometime with her.  Patient has 4 daughter and one son.  2 of her daughter lives close by.      Alcohol and  Substance use history The patient has significant history of drinking alcohol for more than 30 years.  She  has been admitted few times due to relapse into drinking and worsening of depression .  Patient claims to be sober since she released from the hospital.    Mental status examination.  Patient is casually dressed.  She is pleasant cooperative and maintained good eye contact.  Her attention and concentration is  good.  Her speech is clear and coherent.  Her thought process is also logical and goal-directed.  She described her mood as fairly good but anxious and her affect is mood appropriate.  She denies any active or passive suicidal thoughts or homicidal thoughts.  There were no paranoia or delusion present at this time.  There were no tremors or shakes present.  Her attention concentration is good.  She's alert oriented x3.  There were no flight of idea or loose association.  Her insight judgment and pulse control isgood. Fund of knowledge is fair speech is clear and coherent and memory is fair as well  Lab Results:  Results for orders placed or performed in visit on 06/11/14 (from the past 8736 hour(s))  CMP14+EGFR   Collection Time: 06/11/14  4:38 PM  Result Value Ref Range   Glucose 85 65 - 99 mg/dL   BUN 19 8 - 27 mg/dL   Creatinine, Ser 0.77 0.57 - 1.00 mg/dL   GFR calc non Af Amer 75 >59 mL/min/1.73   GFR calc Af Amer 86 >59 mL/min/1.73   BUN/Creatinine Ratio 25 11 - 26   Sodium 141 134 - 144 mmol/L   Potassium 3.7 3.5 - 5.2 mmol/L   Chloride 102 97 - 108 mmol/L   CO2 26 18 - 29 mmol/L   Calcium 9.7 8.7 - 10.3 mg/dL   Total Protein 7.3 6.0 - 8.5 g/dL   Albumin 4.2 3.5 - 4.8 g/dL   Globulin, Total 3.1 1.5 - 4.5 g/dL   Albumin/Globulin Ratio 1.4 1.1 - 2.5   Bilirubin Total 0.4 0.0 - 1.2 mg/dL   Alkaline Phosphatase 83 39 - 117 IU/L   AST 18 0 - 40 IU/L   ALT 12 0 - 32 IU/L  POCT CBC   Collection Time: 06/11/14  4:48 PM  Result Value Ref Range   WBC 6.6 4.6 - 10.2 K/uL    Lymph, poc 3.5 (A) 0.6 - 3.4   POC LYMPH PERCENT 53.1 (A) 10 - 50 %L   POC Granulocyte 2.5 2 - 6.9   Granulocyte percent 38.3 37 - 80 %G   RBC 4.5 4.04 - 5.48 M/uL   Hemoglobin 13.1 12.2 - 16.2 g/dL   HCT, POC 40.3 37.7 - 47.9 %   MCV 90.3 80 - 97 fL   MCH, POC 29.4 27 - 31.2 pg   MCHC 32.6 31.8 - 35.4 g/dL   RDW,  POC 14.3 %   Platelet Count, POC 316.0 142 - 424 K/uL   MPV 6.9 0 - 99.8 fL  Results for orders placed or performed in visit on 01/14/14 (from the past 8736 hour(s))  CMP14+EGFR   Collection Time: 01/14/14 11:19 AM  Result Value Ref Range   Glucose 89 65 - 99 mg/dL   BUN 14 8 - 27 mg/dL   Creatinine, Ser 0.78 0.57 - 1.00 mg/dL   GFR calc non Af Amer 74 >59 mL/min/1.73   GFR calc Af Amer 85 >59 mL/min/1.73   BUN/Creatinine Ratio 18 11 - 26   Sodium 140 134 - 144 mmol/L   Potassium 4.2 3.5 - 5.2 mmol/L   Chloride 101 97 - 108 mmol/L   CO2 26 18 - 29 mmol/L   Calcium 9.4 8.7 - 10.3 mg/dL   Total Protein 6.7 6.0 - 8.5 g/dL   Albumin 4.0 3.5 - 4.8 g/dL   Globulin, Total 2.7 1.5 - 4.5 g/dL   Albumin/Globulin Ratio 1.5 1.1 - 2.5   Total Bilirubin 0.4 0.0 - 1.2 mg/dL   Alkaline Phosphatase 83 39 - 117 IU/L   AST 15 0 - 40 IU/L   ALT 10 0 - 32 IU/L  Lipid panel   Collection Time: 01/14/14 11:19 AM  Result Value Ref Range   Cholesterol, Total 163 100 - 199 mg/dL   Triglycerides 79 0 - 149 mg/dL   HDL 44 >39 mg/dL   VLDL Cholesterol Cal 16 5 - 40 mg/dL   LDL Calculated 103 (H) 0 - 99 mg/dL   Chol/HDL Ratio 3.7 0.0 - 4.4 ratio units  Vit D  25 hydroxy (rtn osteoporosis monitoring)   Collection Time: 01/14/14 11:19 AM  Result Value Ref Range   Vit D, 25-Hydroxy 35.8 30.0 - 100.0 ng/mL  PCP draws routine labs and nothing is emerging as of concern.  Assessment.  Axis I Maj.  Depressive disorder , alcohol abuse.  anxiety disorder NOS  Axis II deferred Axis III hypertension  Axis IV moderate Axis V 60-65  Plan/Discussion: I took her vitals.  I reviewed CC,  tobacco/med/surg Hx, meds effects/ side effects, problem list, therapies and responses as well as current situation/symptoms discussed options. He'll continue Prozac for depression and Neurontin for anxiety. She will increase BuSpar 15 mg to 3 times a day for anxiety She'll return in 3 months but if her depression worsens she will call us See orders and pt instructions for more details.  MEDICATIONS this encounter: Meds ordered this encounter  Medications  . busPIRone (BUSPAR) 15 MG tablet    Sig: Take 1 tablet (15 mg total) by mouth 3 (three) times daily.    Dispense:  270 tablet    Refill:  3  . gabapentin (NEURONTIN) 100 MG capsule    Sig: Take 1 capsule (100 mg total) by mouth 2 (two) times daily.    Dispense:  180 capsule    Refill:  3  . FLUoxetine (PROZAC) 40 MG capsule    Sig: Take one capsule daily (102m) by mouth for anxiety and depression.    Dispense:  90 capsule    Refill:  3   Medical Decision Making Problem Points:  Established problem, stable/improving (1), Review of last therapy session (1) and Review of psycho-social stressors (1) Data Points:  Review or order clinical lab tests (1) Review of medication regiment & side effects (2)  I certify that outpatient services furnished can reasonably be expected to improve the patient's condition.  Levonne Spiller, MD

## 2014-12-18 ENCOUNTER — Ambulatory Visit (INDEPENDENT_AMBULATORY_CARE_PROVIDER_SITE_OTHER): Payer: Medicare Other | Admitting: Family Medicine

## 2014-12-18 ENCOUNTER — Encounter: Payer: Self-pay | Admitting: Family Medicine

## 2014-12-18 VITALS — BP 130/82 | HR 93 | Temp 98.2°F | Ht 61.0 in | Wt 142.0 lb

## 2014-12-18 DIAGNOSIS — R0981 Nasal congestion: Secondary | ICD-10-CM | POA: Diagnosis not present

## 2014-12-18 DIAGNOSIS — H6122 Impacted cerumen, left ear: Secondary | ICD-10-CM | POA: Diagnosis not present

## 2014-12-18 DIAGNOSIS — E785 Hyperlipidemia, unspecified: Secondary | ICD-10-CM | POA: Diagnosis not present

## 2014-12-18 DIAGNOSIS — H918X2 Other specified hearing loss, left ear: Secondary | ICD-10-CM

## 2014-12-18 DIAGNOSIS — H6091 Unspecified otitis externa, right ear: Secondary | ICD-10-CM | POA: Diagnosis not present

## 2014-12-18 DIAGNOSIS — I10 Essential (primary) hypertension: Secondary | ICD-10-CM | POA: Diagnosis not present

## 2014-12-18 DIAGNOSIS — H6120 Impacted cerumen, unspecified ear: Secondary | ICD-10-CM | POA: Insufficient documentation

## 2014-12-18 MED ORDER — FLUTICASONE PROPIONATE 50 MCG/ACT NA SUSP: 2.0000 | Freq: Two times a day (BID) | NASAL | Status: DC

## 2014-12-18 MED ORDER — CIPROFLOXACIN-HYDROCORTISONE 0.2-1 % OT SUSP: 3.0000 [drp] | Freq: Two times a day (BID) | OTIC | Status: DC

## 2014-12-18 NOTE — Progress Notes (Signed)
BP 130/82 mmHg  Pulse 93  Temp(Src) 98.2 F (36.8 C) (Oral)  Ht '5\' 1"'  (1.549 m)  Wt 142 lb (64.411 kg)  BMI 26.84 kg/m2   Subjective:    Patient ID: Alexandria Chen, female    DOB: 07/16/1934, 80 y.o.   MRN: [id number]  HPI: Alexandria Chen is a 80 y.o. female presenting on 12/18/2014 for Blood pressure followup; Labwork; Sinus congestion; and Ear Pain   HPI Nasal congestion Patient has been having congestion in her nose and sinuses extending the pressure up into her ears for the past 2-3 days. She does have nasal discharge, but denies any fevers or chills. She denies any sinus congestion today.  Hypertension Patient presents today with hypertension checkup. Her blood pressure today is 130/82. Patient denies any headaches, vision changes, chest pain or lightheadedness. Continues to take her medication of atenolol and felodipine without any side effects or issues.  Ear issues Patient is having ear pressure and pain in her right ear and feels that she has wax problems because she has to wear hearing aids. She often has to have the wax removed from her ear because of the hearing aids but the pain in her right ear is new and changed.  Hyperlipidemia Patient has hyperlipidemia has been on atorvastatin and still takes that medication. She denies any side effects from the medication and is due for recheck on her labs today. She has been controlled pretty well on this medication previously.  Relevant past medical, surgical, family and social history reviewed and updated as indicated. Interim medical history since our last visit reviewed. Allergies and medications reviewed and updated.  Review of Systems  Constitutional: Negative for fever and chills.  HENT: Positive for congestion, ear pain, hearing loss, postnasal drip and rhinorrhea. Negative for ear discharge, sinus pressure and sore throat.   Eyes: Negative for redness and visual disturbance.  Respiratory: Negative for chest tightness and  shortness of breath.   Cardiovascular: Negative for chest pain and leg swelling.  Genitourinary: Negative for dysuria and difficulty urinating.  Musculoskeletal: Negative for back pain and gait problem.  Skin: Negative for rash.  Neurological: Negative for dizziness, speech difficulty, weakness, light-headedness and headaches.  Psychiatric/Behavioral: Negative for behavioral problems and agitation.  All other systems reviewed and are negative.   Per HPI unless specifically indicated above     Medication List       This list is accurate as of: 12/18/14 11:42 AM.  Always use your most recent med list.               aspirin 81 MG tablet  Take 1 tablet (81 mg total) by mouth daily. For platelet aggregation.     atenolol 25 MG tablet  Commonly known as:  TENORMIN  TAKE 1 TABLET (25 MG TOTAL) BY MOUTH DAILY.     atorvastatin 40 MG tablet  Commonly known as:  LIPITOR  TAKE 1 TABLET (40 MG TOTAL) BY MOUTH DAILY. FOR HYPERLIPIDEMIA     busPIRone 15 MG tablet  Commonly known as:  BUSPAR  Take 15 mg by mouth 2 (two) times daily.     ciprofloxacin-hydrocortisone otic suspension  Commonly known as:  CIPRO HC  Place 3 drops into the right ear 2 (two) times daily.     felodipine 10 MG 24 hr tablet  Commonly known as:  PLENDIL  TAKE 1 TABLET (10 MG TOTAL) BY MOUTH DAILY. FOR HYPERTENSION.     fish oil-omega-3 fatty acids 1000  MG capsule  Take 3 capsules (3 g total) by mouth daily. For hyperlipidemia.     FLUoxetine 40 MG capsule  Commonly known as:  PROZAC  Take one capsule daily (59m) by mouth for anxiety and depression.     fluticasone 50 MCG/ACT nasal spray  Commonly known as:  FLONASE  Place 2 sprays into both nostrils 2 (two) times daily. Use as needed     gabapentin 100 MG capsule  Commonly known as:  NEURONTIN  Take 1 capsule (100 mg total) by mouth 2 (two) times daily.     meclizine 25 MG tablet  Commonly known as:  ANTIVERT  TAKE 0.5 TABLETS (12.5 MG TOTAL) BY  MOUTH 2 (TWO) TIMES DAILY AS NEEDED.     Vitamin D (Cholecalciferol) 400 UNITS Caps  Take 400 Units by mouth daily.           Objective:    BP 130/82 mmHg  Pulse 93  Temp(Src) 98.2 F (36.8 C) (Oral)  Ht '5\' 1"'  (1.549 m)  Wt 142 lb (64.411 kg)  BMI 26.84 kg/m2  Wt Readings from Last 3 Encounters:  12/18/14 142 lb (64.411 kg)  10/18/14 140 lb 3.2 oz (63.594 kg)  07/18/14 147 lb (66.679 kg)    Physical Exam  Constitutional: She is oriented to person, place, and time. She appears well-developed and well-nourished. No distress.  HENT:  Right Ear: There is drainage (white with significant cerumen) and tenderness (in the canal). Tympanic membrane is not injected, not scarred, not perforated, not erythematous and not bulging. No middle ear effusion.  Left Ear: There is drainage (cerumen impaction). No tenderness. No mastoid tenderness. Tympanic membrane is not injected, not scarred, not perforated, not erythematous and not bulging.  No middle ear effusion.  Nose: Mucosal edema and rhinorrhea present.  Mouth/Throat: Uvula is midline, oropharynx is clear and moist and mucous membranes are normal.  Eyes: Conjunctivae and EOM are normal. Pupils are equal, round, and reactive to light.  Cardiovascular: Normal rate, regular rhythm, normal heart sounds and intact distal pulses.   No murmur heard. Pulmonary/Chest: Effort normal and breath sounds normal. No respiratory distress. She has no wheezes.  Musculoskeletal: Normal range of motion. She exhibits no edema or tenderness.  Lymphadenopathy:    She has no cervical adenopathy.  Neurological: She is alert and oriented to person, place, and time. Coordination normal.  Skin: Skin is warm and dry. No rash noted. She is not diaphoretic.  Psychiatric: She has a normal mood and affect. Her behavior is normal.  Vitals reviewed.   Results for orders placed or performed in visit on 06/11/14  CMP14+EGFR  Result Value Ref Range   Glucose 85 65 - 99  mg/dL   BUN 19 8 - 27 mg/dL   Creatinine, Ser 0.77 0.57 - 1.00 mg/dL   GFR calc non Af Amer 75 >59 mL/min/1.73   GFR calc Af Amer 86 >59 mL/min/1.73   BUN/Creatinine Ratio 25 11 - 26   Sodium 141 134 - 144 mmol/L   Potassium 3.7 3.5 - 5.2 mmol/L   Chloride 102 97 - 108 mmol/L   CO2 26 18 - 29 mmol/L   Calcium 9.7 8.7 - 10.3 mg/dL   Total Protein 7.3 6.0 - 8.5 g/dL   Albumin 4.2 3.5 - 4.8 g/dL   Globulin, Total 3.1 1.5 - 4.5 g/dL   Albumin/Globulin Ratio 1.4 1.1 - 2.5   Bilirubin Total 0.4 0.0 - 1.2 mg/dL   Alkaline Phosphatase 83 39 - 117  IU/L   AST 18 0 - 40 IU/L   ALT 12 0 - 32 IU/L  POCT CBC  Result Value Ref Range   WBC 6.6 4.6 - 10.2 K/uL   Lymph, poc 3.5 (A) 0.6 - 3.4   POC LYMPH PERCENT 53.1 (A) 10 - 50 %L   POC Granulocyte 2.5 2 - 6.9   Granulocyte percent 38.3 37 - 80 %G   RBC 4.5 4.04 - 5.48 M/uL   Hemoglobin 13.1 12.2 - 16.2 g/dL   HCT, POC 40.3 37.7 - 47.9 %   MCV 90.3 80 - 97 fL   MCH, POC 29.4 27 - 31.2 pg   MCHC 32.6 31.8 - 35.4 g/dL   RDW, POC 14.3 %   Platelet Count, POC 316.0 142 - 424 K/uL   MPV 6.9 0 - 99.8 fL   Ceruminosis is noted, worse on left than right.  Wax is removed by syringing and manual debridement. Instructions for home care to prevent wax buildup are given.  Ears are both mostly clear after cerumen is removed. Still tender in right ear canal.     Assessment & Plan:   Problem List Items Addressed This Visit      Cardiovascular and Mediastinum   Essential hypertension, benign    Controlled today we will refill meds. Continue current medications.      Relevant Orders   BMP8+EGFR     Respiratory   Nasal sinus congestion    Patient has nasal congestion and pressure going up into the right ear. We'll try Flonase first.      Relevant Medications   fluticasone (FLONASE) 50 MCG/ACT nasal spray     Nervous and Auditory   Otitis externa of right ear - Primary    Patient has right otitis externa and pain. We'll send antibiotic  eardrop.      Relevant Medications   ciprofloxacin-hydrocortisone (CIPRO HC) otic suspension   Hearing loss due to cerumen impaction    Cerumen disimpaction was performed on the right and left ears. Right ear was painful before, during and after disimpaction. Curette was used for disimpaction by the physician himself.      Relevant Orders   Remove impacted ear wax     Other   Hyperlipidemia    Continue atorvastatin and we will recheck levels today. Previously has been controlled on atorvastatin. No side effects from her statin.      Relevant Orders   Lipid panel       Follow up plan: Return if symptoms worsen or fail to improve.  Caryl Pina, MD [facility] 12/18/2014, 11:42 AM

## 2014-12-18 NOTE — Assessment & Plan Note (Addendum)
Cerumen disimpaction was performed on the right and left ears. Right ear was painful before, during and after disimpaction. Curette was used for disimpaction by the physician himself.

## 2014-12-18 NOTE — Assessment & Plan Note (Signed)
Continue atorvastatin and we will recheck levels today. Previously has been controlled on atorvastatin. No side effects from her statin.

## 2014-12-18 NOTE — Assessment & Plan Note (Signed)
Patient has right otitis externa and pain. We'll send antibiotic eardrop.

## 2014-12-18 NOTE — Assessment & Plan Note (Signed)
Patient has nasal congestion and pressure going up into the right ear. We'll try Flonase first.

## 2014-12-18 NOTE — Assessment & Plan Note (Addendum)
Controlled today we will refill meds. Continue current medications.

## 2014-12-19 LAB — LIPID PANEL
CHOL/HDL RATIO: 4.2 ratio (ref 0.0–4.4)
Cholesterol, Total: 168 mg/dL (ref 100–199)
HDL: 40 mg/dL (ref >39)
LDL CALC: 116 mg/dL — AB (ref 0–99)
TRIGLYCERIDES: 59 mg/dL (ref 0–149)
VLDL Cholesterol Cal: 12 mg/dL (ref 5–40)

## 2014-12-19 LAB — BMP8+EGFR
BUN/Creatinine Ratio: 16 (ref 11–26)
BUN: 11 mg/dL (ref 8–27)
CO2: 24 mmol/L (ref 18–29)
CREATININE: 0.7 mg/dL (ref 0.57–1.00)
Calcium: 9.3 mg/dL (ref 8.7–10.3)
Chloride: 102 mmol/L (ref 97–108)
GFR, EST AFRICAN AMERICAN: 96 mL/min/{1.73_m2} (ref >59)
GFR, EST NON AFRICAN AMERICAN: 83 mL/min/{1.73_m2} (ref >59)
Glucose: 102 mg/dL — ABNORMAL HIGH (ref 65–99)
Potassium: 3.9 mmol/L (ref 3.5–5.2)
SODIUM: 142 mmol/L (ref 134–144)

## 2015-01-04 ENCOUNTER — Other Ambulatory Visit: Payer: Self-pay | Admitting: Nurse Practitioner

## 2015-01-05 DIAGNOSIS — R55 Syncope and collapse: Secondary | ICD-10-CM | POA: Diagnosis not present

## 2015-01-05 DIAGNOSIS — I10 Essential (primary) hypertension: Secondary | ICD-10-CM | POA: Diagnosis not present

## 2015-01-05 DIAGNOSIS — F419 Anxiety disorder, unspecified: Secondary | ICD-10-CM | POA: Diagnosis not present

## 2015-01-05 DIAGNOSIS — R404 Transient alteration of awareness: Secondary | ICD-10-CM | POA: Diagnosis not present

## 2015-01-05 DIAGNOSIS — Z7982 Long term (current) use of aspirin: Secondary | ICD-10-CM | POA: Diagnosis not present

## 2015-01-05 DIAGNOSIS — R001 Bradycardia, unspecified: Secondary | ICD-10-CM | POA: Diagnosis not present

## 2015-01-17 ENCOUNTER — Ambulatory Visit (HOSPITAL_COMMUNITY): Payer: Self-pay | Admitting: Psychiatry

## 2015-01-30 ENCOUNTER — Telehealth: Payer: Self-pay | Admitting: Family

## 2015-02-12 ENCOUNTER — Encounter (HOSPITAL_COMMUNITY): Payer: Self-pay | Admitting: Psychiatry

## 2015-02-12 ENCOUNTER — Ambulatory Visit (INDEPENDENT_AMBULATORY_CARE_PROVIDER_SITE_OTHER): Payer: Medicare Other | Admitting: Psychiatry

## 2015-02-12 VITALS — BP 139/65 | HR 57 | Ht 61.0 in | Wt 144.0 lb

## 2015-02-12 DIAGNOSIS — F419 Anxiety disorder, unspecified: Secondary | ICD-10-CM | POA: Diagnosis not present

## 2015-02-12 DIAGNOSIS — F1010 Alcohol abuse, uncomplicated: Secondary | ICD-10-CM

## 2015-02-12 DIAGNOSIS — F331 Major depressive disorder, recurrent, moderate: Secondary | ICD-10-CM

## 2015-02-12 MED ORDER — FLUOXETINE HCL 40 MG PO CAPS: ORAL_CAPSULE | ORAL | Status: DC

## 2015-02-12 MED ORDER — BUSPIRONE HCL 15 MG PO TABS: 15.0000 mg | ORAL_TABLET | Freq: Two times a day (BID) | ORAL | Status: DC

## 2015-02-12 MED ORDER — GABAPENTIN 100 MG PO CAPS: 100.0000 mg | ORAL_CAPSULE | Freq: Two times a day (BID) | ORAL | Status: DC

## 2015-02-12 NOTE — Progress Notes (Signed)
Patient ID: Alexandria Chen, female   DOB: [birth year], 80 y.o.   MRN: [id number] Patient ID: Alexandria Chen, female   DOB: May 18, 1934, 80 y.o.   MRN: [id number] Patient ID: Alexandria Chen, female   DOB: 10/27/1934, 80 y.o.   MRN: [id number] Patient ID: Alexandria Chen, female   DOB: [birth year], 80 y.o.   MRN: [id number] Patient ID: Alexandria Chen, female   DOB: 1935/01/22, 80 y.o.   MRN: [id number] Patient ID: Alexandria Chen, female   DOB: October 20, 1934, 80 y.o.   MRN: [id number] Patient ID: Alexandria Chen, female   DOB: 07-21-1934, 80 y.o.   MRN: [id number] Patient ID: Alexandria Chen, female   DOB: Apr 04, 1935, 80 y.o.   MRN: [id number] Patient ID: Alexandria Chen, female   DOB: 02/15/1935, 80 y.o.   MRN: [id number] [place] [id number] Progress Note [name] MRN: [id number] DOB: [birth year] Age: 80 y.o.  Date: 02/12/2015 Start Time: 10:40 AM End Time: 10:59 AM  Chief Complaint: Chief Complaint  Patient presents with  . Depression  . Anxiety  . Follow-up   Subjective:  "I've been nervous"  This patient is a 80 year old widowed black female who lives with her 87 year old grandson in [place]. She is a retired Engineer, materials.  The patient has had a long history of depression anxiety. She's always been an anxious person and thinks she inherited this trait from her mother. She used to abuse alcohol but had stopped for several years until a relapse last year. She was hospitalized in 2013 for alcohol relapse and has not used since. Her 34 year old mother lives across the street and calls her constantly and this makes her nervous. She does state that the addition of Neurontin and BuSpar helped considerably with her anxiety. In general her mood is good, her energy is good and she is sleeping well. She denies any suicidal ideation  Patient returns after 3 months. She is doing ok. Her her 64 year old grandson is living with her as well as her 72-year-old great granddaughter. She states that the child talks back to her a lot  as does the grandson. This makes her nervous but she hasn't been able to assess him to leave. Overall her mood is fairly stable as is her anxiety. Every now and then she has insomnia but most the time she sleeps pretty well.  Current psychiatric medication BuSpar 15 mg 3 times a day Neurontin 100 mg twice a day Prozac 40 mg daily.   BP 139/65 mmHg  Pulse 57  Ht '5\' 1"'  (1.549 m)  Wt 144 lb (65.318 kg)  BMI 27.22 kg/m2  SpO2 94%  Past psychiatric history Patient was  admitted to behavioral [facility] in June 2013 due to relapse into her alcohol.  She has significant history of depression and alcohol abuse.  She has at least 2 other psychiatric admission at behavioral [facility] which are exacerbated by alcohol intake.   Patient had a good response with Prozac and BuSpar.  She was taking benzodiazepine in the past however due to history of alcohol it was discontinued.  Patient denies any history of paranoia or psychosis.  Allergies: No Known Allergies Medical History: Past Medical History  Diagnosis Date  . HTN (hypertension)   . Depression   . Anxiety   . Alcohol abuse, in remission   Hypertension.  Patient sees Dr. Ronnald Collum in [place].    Surgical History: History reviewed. No pertinent past surgical history. Family History: family  history includes Alcohol abuse in her brother and father; Anxiety disorder in her mother; Dementia in her mother. There is no history of ADD / ADHD, Drug abuse, Bipolar disorder, Depression, OCD, Paranoid behavior, Schizophrenia, Seizures, Sexual abuse, or Physical abuse. Reviewed and no changes noted today.  Psychosocial history Patient lives by herself however his grandson stays sometime with her.  Patient has 4 daughter and one son.  2 of her daughter lives close by.      Alcohol and Substance use history The patient has significant history of drinking alcohol for more than 30 years.  She  has been admitted few times due to  relapse into drinking and worsening of depression .  Patient claims to be sober since she released from the hospital.    Mental status examination.  Patient is casually dressed.  She is pleasant cooperative and maintained good eye contact.  Her attention and concentration is  good.  Her speech is clear and coherent.  Her thought process is also logical and goal-directed.  She described her mood as fairly good but anxious and her affect is mood appropriate.  She denies any active or passive suicidal thoughts or homicidal thoughts.  There were no paranoia or delusion present at this time.  There were no tremors or shakes present.  Her attention concentration is good.  She's alert oriented x3.  There were no Chen of idea or loose association.  Her insight judgment and pulse control isgood. Fund of knowledge is fair speech is clear and coherent and memory is fair as well  Lab Results:  Results for orders placed or performed in visit on 12/18/14 (from the past 8736 hour(s))  BMP8+EGFR   Collection Time: 12/18/14  9:11 AM  Result Value Ref Range   Glucose 102 (H) 65 - 99 mg/dL   BUN 11 8 - 27 mg/dL   Creatinine, Ser 0.70 0.57 - 1.00 mg/dL   GFR calc non Af Amer 83 >59 mL/min/1.73   GFR calc Af Amer 96 >59 mL/min/1.73   BUN/Creatinine Ratio 16 11 - 26   Sodium 142 134 - 144 mmol/L   Potassium 3.9 3.5 - 5.2 mmol/L   Chloride 102 97 - 108 mmol/L   CO2 24 18 - 29 mmol/L   Calcium 9.3 8.7 - 10.3 mg/dL  Lipid panel   Collection Time: 12/18/14  9:11 AM  Result Value Ref Range   Cholesterol, Total 168 100 - 199 mg/dL   Triglycerides 59 0 - 149 mg/dL   HDL 40 >39 mg/dL   VLDL Cholesterol Cal 12 5 - 40 mg/dL   LDL Calculated 116 (H) 0 - 99 mg/dL   Chol/HDL Ratio 4.2 0.0 - 4.4 ratio units  Results for orders placed or performed in visit on 06/11/14 (from the past 8736 hour(s))  CMP14+EGFR   Collection Time: 06/11/14  4:38 PM  Result Value Ref Range   Glucose 85 65 - 99 mg/dL   BUN 19 8 - 27 mg/dL    Creatinine, Ser 0.77 0.57 - 1.00 mg/dL   GFR calc non Af Amer 75 >59 mL/min/1.73   GFR calc Af Amer 86 >59 mL/min/1.73   BUN/Creatinine Ratio 25 11 - 26   Sodium 141 134 - 144 mmol/L   Potassium 3.7 3.5 - 5.2 mmol/L   Chloride 102 97 - 108 mmol/L   CO2 26 18 - 29 mmol/L   Calcium 9.7 8.7 - 10.3 mg/dL   Total Protein 7.3 6.0 - 8.5 g/dL   Albumin  4.2 3.5 - 4.8 g/dL   Globulin, Total 3.1 1.5 - 4.5 g/dL   Albumin/Globulin Ratio 1.4 1.1 - 2.5   Bilirubin Total 0.4 0.0 - 1.2 mg/dL   Alkaline Phosphatase 83 39 - 117 IU/L   AST 18 0 - 40 IU/L   ALT 12 0 - 32 IU/L  POCT CBC   Collection Time: 06/11/14  4:48 PM  Result Value Ref Range   WBC 6.6 4.6 - 10.2 K/uL   Lymph, poc 3.5 (A) 0.6 - 3.4   POC LYMPH PERCENT 53.1 (A) 10 - 50 %L   POC Granulocyte 2.5 2 - 6.9   Granulocyte percent 38.3 37 - 80 %G   RBC 4.5 4.04 - 5.48 M/uL   Hemoglobin 13.1 12.2 - 16.2 g/dL   HCT, POC 40.3 37.7 - 47.9 %   MCV 90.3 80 - 97 fL   MCH, POC 29.4 27 - 31.2 pg   MCHC 32.6 31.8 - 35.4 g/dL   RDW, POC 14.3 %   Platelet Count, POC 316.0 142 - 424 K/uL   MPV 6.9 0 - 99.8 fL  PCP draws routine labs and nothing is emerging as of concern.  Assessment.  Axis I Maj.  Depressive disorder , alcohol abuse.  anxiety disorder NOS  Axis II deferred Axis III hypertension  Axis IV moderate Axis V 60-65  Plan/Discussion: I took her vitals.  I reviewed CC, tobacco/med/surg Hx, meds effects/ side effects, problem list, therapies and responses as well as current situation/symptoms discussed options. He'll continue Prozac for depression and Neurontin for anxiety. She will continue BuSpar 15 mg to 3 times a day for anxiety She'll return in 3 months but if her depression worsens she will call us See orders and pt instructions for more details.  MEDICATIONS this encounter: Meds ordered this encounter  Medications  . DISCONTD: ciprofloxacin (CILOXAN) 0.3 % ophthalmic solution    Sig: INSTILL 3 DROPS INTO THE RIGHT EAR 2  TIMES DAILY    Refill:  0  . gabapentin (NEURONTIN) 100 MG capsule    Sig: Take 1 capsule (100 mg total) by mouth 2 (two) times daily.    Dispense:  180 capsule    Refill:  3  . FLUoxetine (PROZAC) 40 MG capsule    Sig: Take one capsule daily (89m) by mouth for anxiety and depression.    Dispense:  90 capsule    Refill:  3  . busPIRone (BUSPAR) 15 MG tablet    Sig: Take 1 tablet (15 mg total) by mouth 2 (two) times daily.    Dispense:  60 tablet    Refill:  2   Medical Decision Making Problem Points:  Established problem, stable/improving (1), Review of last therapy session (1) and Review of psycho-social stressors (1) Data Points:  Review or order clinical lab tests (1) Review of medication regiment & side effects (2)  I certify that outpatient services furnished can reasonably be expected to improve the patient's condition.   RLevonne Spiller MD

## 2015-02-15 ENCOUNTER — Other Ambulatory Visit: Payer: Self-pay | Admitting: Family Medicine

## 2015-03-07 ENCOUNTER — Other Ambulatory Visit: Payer: Self-pay | Admitting: Family Medicine

## 2015-03-11 ENCOUNTER — Ambulatory Visit (INDEPENDENT_AMBULATORY_CARE_PROVIDER_SITE_OTHER): Payer: Medicare Other | Admitting: Family

## 2015-03-11 ENCOUNTER — Encounter: Payer: Self-pay | Admitting: Family

## 2015-03-11 VITALS — BP 131/67 | HR 52 | Temp 97.2°F | Ht 61.0 in | Wt 142.2 lb

## 2015-03-11 DIAGNOSIS — J309 Allergic rhinitis, unspecified: Secondary | ICD-10-CM | POA: Diagnosis not present

## 2015-03-11 DIAGNOSIS — Z23 Encounter for immunization: Secondary | ICD-10-CM

## 2015-03-11 DIAGNOSIS — R0981 Nasal congestion: Secondary | ICD-10-CM

## 2015-03-11 MED ORDER — MECLIZINE HCL 25 MG PO TABS: ORAL_TABLET | ORAL | Status: DC

## 2015-03-11 MED ORDER — FLUTICASONE PROPIONATE 50 MCG/ACT NA SUSP: 2.0000 | Freq: Two times a day (BID) | NASAL | Status: DC

## 2015-03-11 NOTE — Patient Instructions (Signed)
Allergic Rhinitis Allergic rhinitis is when the mucous membranes in the nose respond to allergens. Allergens are particles in the air that cause your body to have an allergic reaction. This causes you to release allergic antibodies. Through a chain of events, these eventually cause you to release histamine into the blood stream. Although meant to protect the body, it is this release of histamine that causes your discomfort, such as frequent sneezing, congestion, and an itchy, runny nose.  CAUSES Seasonal allergic rhinitis (hay fever) is caused by pollen allergens that may come from grasses, trees, and weeds. Year-round allergic rhinitis (perennial allergic rhinitis) is caused by allergens such as house dust mites, pet dander, and mold spores. SYMPTOMS  Nasal stuffiness (congestion).  Itchy, runny nose with sneezing and tearing of the eyes. DIAGNOSIS Your health care provider can help you determine the allergen or allergens that trigger your symptoms. If you and your health care provider are unable to determine the allergen, skin or blood testing may be used. Your health care provider will diagnose your condition after taking your health history and performing a physical exam. Your health care provider may assess you for other related conditions, such as asthma, pink eye, or an ear infection. TREATMENT Allergic rhinitis does not have a cure, but it can be controlled by:  Medicines that block allergy symptoms. These may include allergy shots, nasal sprays, and oral antihistamines.  Avoiding the allergen. Hay fever may often be treated with antihistamines in pill or nasal spray forms. Antihistamines block the effects of histamine. There are over-the-counter medicines that may help with nasal congestion and swelling around the eyes. Check with your health care provider before taking or giving this medicine. If avoiding the allergen or the medicine prescribed do not work, there are many new medicines  your health care provider can prescribe. Stronger medicine may be used if initial measures are ineffective. Desensitizing injections can be used if medicine and avoidance does not work. Desensitization is when a patient is given ongoing shots until the body becomes less sensitive to the allergen. Make sure you follow up with your health care provider if problems continue. HOME CARE INSTRUCTIONS It is not possible to completely avoid allergens, but you can reduce your symptoms by taking steps to limit your exposure to them. It helps to know exactly what you are allergic to so that you can avoid your specific triggers. SEEK MEDICAL CARE IF:  You have a fever.  You develop a cough that does not stop easily (persistent).  You have shortness of breath.  You start wheezing.  Symptoms interfere with normal daily activities.   This information is not intended to replace advice given to you by your health care provider. Make sure you discuss any questions you have with your health care provider.   Document Released: 01/12/2001 Document Revised: 05/10/2014 Document Reviewed: 12/25/2012 Elsevier Interactive Patient Education 2016 [employer]  

## 2015-03-11 NOTE — Progress Notes (Signed)
   Subjective:    Patient ID: Alexandria Chen, female    DOB: 1934/05/20, 80 y.o.   MRN: [id number]  Otalgia  There is pain in both ears. This is a recurrent problem. The current episode started 1 to 4 weeks ago. The problem occurs hourly. The problem has been waxing and waning. There has been no fever. The pain is at a severity of 3/10. The pain is mild. Associated symptoms include hearing loss and rhinorrhea. Pertinent negatives include no coughing, ear discharge, headaches, sore throat or vomiting. Treatments tried: flonase. The treatment provided mild relief.      Review of Systems  Constitutional: Negative.   HENT: Positive for ear pain, hearing loss and rhinorrhea. Negative for ear discharge and sore throat.   Eyes: Negative.   Respiratory: Negative.  Negative for cough and shortness of breath.   Cardiovascular: Negative.  Negative for palpitations.  Gastrointestinal: Negative.  Negative for vomiting.  Endocrine: Negative.   Genitourinary: Negative.   Musculoskeletal: Negative.   Neurological: Negative.  Negative for headaches.  Hematological: Negative.   Psychiatric/Behavioral: Negative.   All other systems reviewed and are negative.      Objective:   Physical Exam  Constitutional: She is oriented to person, place, and time. She appears well-developed and well-nourished. No distress.  HENT:  Head: Normocephalic and atraumatic.  Right Ear: A middle ear effusion is present.  Left Ear: A middle ear effusion is present.  Nasal passage erythemas with moderate swelling  Oropharynx erythemas    Eyes: Pupils are equal, round, and reactive to light.  Neck: Normal range of motion. Neck supple. No thyromegaly present.  Cardiovascular: Normal rate, regular rhythm, normal heart sounds and intact distal pulses.   No murmur heard. Pulmonary/Chest: Effort normal and breath sounds normal. No respiratory distress. She has no wheezes.  Abdominal: Soft. Bowel sounds are normal. She  exhibits no distension. There is no tenderness.  Musculoskeletal: Normal range of motion. She exhibits no edema or tenderness.  Neurological: She is alert and oriented to person, place, and time. She has normal reflexes. No cranial nerve deficit.  Skin: Skin is warm and dry.  Psychiatric: She has a normal mood and affect. Her behavior is normal. Judgment and thought content normal.  Vitals reviewed.   BP 131/67 mmHg  Pulse 52  Temp(Src) 97.2 F (36.2 C) (Oral)  Ht '5\' 1"'$  (1.549 m)  Wt 142 lb 3.2 oz (64.501 kg)  BMI 26.88 kg/m2       Assessment & Plan:  1. Nasal sinus congestion - fluticasone (FLONASE) 50 MCG/ACT nasal spray; Place 2 sprays into both nostrils 2 (two) times daily. Use as needed  Dispense: 16 g; Refill: 6  2. Allergic rhinitis, unspecified allergic rhinitis type -Avoid allergens -Falls precaution discussed -Use flonase daily  - meclizine (ANTIVERT) 25 MG tablet; TAKE 0.5 TABLETS (12.5 MG TOTAL) BY MOUTH 2 (TWO) TIMES DAILY AS NEEDED.  Dispense: 20 tablet; Refill: 2  Flu and pneumonia given today   Evelina Dun, FNP

## 2015-03-11 NOTE — Addendum Note (Signed)
Addended by: Shelbie Ammons on: 03/11/2015 01:00 PM   Modules accepted: Orders

## 2015-04-04 ENCOUNTER — Other Ambulatory Visit: Payer: Self-pay | Admitting: Family

## 2015-04-06 DIAGNOSIS — R531 Weakness: Secondary | ICD-10-CM | POA: Diagnosis not present

## 2015-04-06 DIAGNOSIS — R404 Transient alteration of awareness: Secondary | ICD-10-CM | POA: Diagnosis not present

## 2015-04-06 DIAGNOSIS — R42 Dizziness and giddiness: Secondary | ICD-10-CM | POA: Diagnosis not present

## 2015-04-07 ENCOUNTER — Encounter: Payer: Self-pay | Admitting: Family Medicine

## 2015-04-07 ENCOUNTER — Ambulatory Visit (INDEPENDENT_AMBULATORY_CARE_PROVIDER_SITE_OTHER): Payer: Medicare Other | Admitting: Family Medicine

## 2015-04-07 ENCOUNTER — Ambulatory Visit (INDEPENDENT_AMBULATORY_CARE_PROVIDER_SITE_OTHER): Payer: Medicare Other

## 2015-04-07 VITALS — BP 131/73 | HR 94 | Temp 98.2°F | Ht 61.0 in | Wt 135.4 lb

## 2015-04-07 DIAGNOSIS — J40 Bronchitis, not specified as acute or chronic: Secondary | ICD-10-CM | POA: Diagnosis not present

## 2015-04-07 DIAGNOSIS — R05 Cough: Secondary | ICD-10-CM

## 2015-04-07 DIAGNOSIS — R059 Cough, unspecified: Secondary | ICD-10-CM

## 2015-04-07 DIAGNOSIS — J329 Chronic sinusitis, unspecified: Secondary | ICD-10-CM

## 2015-04-07 MED ORDER — AMOXICILLIN-POT CLAVULANATE 875-125 MG PO TABS: 1.0000 | ORAL_TABLET | Freq: Two times a day (BID) | ORAL | Status: DC

## 2015-04-07 MED ORDER — GUAIFENESIN-CODEINE 100-10 MG/5ML PO SYRP: 5.0000 mL | ORAL_SOLUTION | ORAL | Status: DC | PRN

## 2015-04-07 NOTE — Progress Notes (Signed)
Subjective:  Patient ID: Alexandria Chen, female    DOB: Apr 07, 1935  Age: 80 y.o. MRN: [id number]  CC: URI   HPI Alexandria Chen presents for Patient presents with upper respiratory congestion. Rhinorrhea that is frequently purulent. There is moderate sore throat. Patient reports coughing frequently as well.-colored/purulent sputum noted. There is no fever no chills no sweats. The patient denies being short of breath. Onset was 3-5 days ago. Gradually worsening in spite of home remedies.   History Alexandria Chen has a past medical history of HTN (hypertension); Depression; Anxiety; and Alcohol abuse, in remission.   She has no past surgical history on file.   Her family history includes Alcohol abuse in her brother and father; Anxiety disorder in her mother; Dementia in her mother. There is no history of ADD / ADHD, Drug abuse, Bipolar disorder, Depression, OCD, Paranoid behavior, Schizophrenia, Seizures, Sexual abuse, or Physical abuse.She reports that she has been smoking Cigarettes.  She started smoking about 58 years ago. She has a 50 pack-year smoking history. She has never used smokeless tobacco. She reports that she does not drink alcohol or use illicit drugs.  Current Outpatient Prescriptions on File Prior to Visit  Medication Sig Dispense Refill  . aspirin 81 MG tablet Take 1 tablet (81 mg total) by mouth daily. For platelet aggregation. 30 tablet   . atenolol (TENORMIN) 25 MG tablet TAKE 1 TABLET (25 MG TOTAL) BY MOUTH DAILY. 90 tablet 4  . atorvastatin (LIPITOR) 40 MG tablet TAKE 1 TABLET (40 MG TOTAL) BY MOUTH DAILY. FOR HYPERLIPIDEMIA 90 tablet 0  . busPIRone (BUSPAR) 15 MG tablet Take 1 tablet (15 mg total) by mouth 2 (two) times daily. 60 tablet 2  . felodipine (PLENDIL) 10 MG 24 hr tablet TAKE 1 TABLET (10 MG TOTAL) BY MOUTH DAILY. FOR HYPERTENSION. 30 tablet 5  . fish oil-omega-3 fatty acids 1000 MG capsule Take 3 capsules (3 g total) by mouth daily. For hyperlipidemia. 30 capsule   .  FLUoxetine (PROZAC) 40 MG capsule Take one capsule daily ('40mg'$ ) by mouth for anxiety and depression. 90 capsule 3  . fluticasone (FLONASE) 50 MCG/ACT nasal spray Place 2 sprays into both nostrils 2 (two) times daily. Use as needed 16 g 6  . gabapentin (NEURONTIN) 100 MG capsule Take 1 capsule (100 mg total) by mouth 2 (two) times daily. 180 capsule 3  . meclizine (ANTIVERT) 25 MG tablet TAKE 0.5 TABLETS (12.5 MG TOTAL) BY MOUTH 2 (TWO) TIMES DAILY AS NEEDED. 20 tablet 2  . Vitamin D, Cholecalciferol, 400 UNITS CAPS Take 400 Units by mouth daily.     No current facility-administered medications on file prior to visit.    ROS Review of Systems  Constitutional: Negative for fever, chills, activity change and appetite change.  HENT: Positive for congestion, postnasal drip, rhinorrhea and sinus pressure (frontal). Negative for ear discharge, ear pain, hearing loss, nosebleeds, sneezing and trouble swallowing.   Respiratory: Positive for cough. Negative for chest tightness and shortness of breath.   Cardiovascular: Negative for chest pain and palpitations.  Skin: Negative for rash.    Objective:  BP 131/73 mmHg  Pulse 94  Temp(Src) 98.2 F (36.8 C) (Oral)  Ht '5\' 1"'$  (1.549 m)  Wt 135 lb 6.4 oz (61.417 kg)  BMI 25.60 kg/m2  SpO2 97%  Physical Exam  Constitutional: She appears well-developed and well-nourished.  HENT:  Head: Normocephalic and atraumatic.  Right Ear: Tympanic membrane and external ear normal. No decreased hearing is noted.  Left Ear: Tympanic membrane and external ear normal. No decreased hearing is noted.  Nose: Mucosal edema present. Right sinus exhibits no frontal sinus tenderness. Left sinus exhibits no frontal sinus tenderness.  Mouth/Throat: No oropharyngeal exudate or posterior oropharyngeal erythema.  Eyes: Pupils are equal, round, and reactive to light.  Neck: Normal range of motion. No Brudzinski's sign noted.  Pulmonary/Chest: No respiratory distress. She has  wheezes (scatterd rhonchi).  Lymphadenopathy:       Head (right side): No preauricular adenopathy present.       Head (left side): No preauricular adenopathy present.       Right cervical: No superficial cervical adenopathy present.      Left cervical: No superficial cervical adenopathy present.    Assessment & Plan:   Alexandria Chen was seen today for uri.  Diagnoses and all orders for this visit:  Cough -     DG Chest 2 View  Sinobronchitis  Other orders -     amoxicillin-clavulanate (AUGMENTIN) 875-125 MG tablet; Take 1 tablet by mouth 2 (two) times daily. Take all of this medication -     guaiFENesin-codeine (CHERATUSSIN AC) 100-10 MG/5ML syrup; Take 5 mLs by mouth every 4 (four) hours as needed for cough.  Normal chest xray- no masses , no infiltrates, no effusions or edema heart not enlarged. Dorsal kyphosis noted - Preliminary reading by Claretta Fraise, MD  I am having Alexandria Chen start on amoxicillin-clavulanate and guaiFENesin-codeine. I am also having her maintain her aspirin, fish oil-omega-3 fatty acids, atenolol, Vitamin D (Cholecalciferol), felodipine, gabapentin, FLUoxetine, busPIRone, fluticasone, meclizine, and atorvastatin.  Meds ordered this encounter  Medications  . amoxicillin-clavulanate (AUGMENTIN) 875-125 MG tablet    Sig: Take 1 tablet by mouth 2 (two) times daily. Take all of this medication    Dispense:  20 tablet    Refill:  0  . guaiFENesin-codeine (CHERATUSSIN AC) 100-10 MG/5ML syrup    Sig: Take 5 mLs by mouth every 4 (four) hours as needed for cough.    Dispense:  180 mL    Refill:  0     Follow-up: Return if symptoms worsen or fail to improve.  Claretta Fraise, M.D.

## 2015-04-07 NOTE — Patient Instructions (Signed)
Merry Christmas  Take all of the antibiotic. If the cough persists follow-up when you finish the medication.

## 2015-04-14 ENCOUNTER — Ambulatory Visit (INDEPENDENT_AMBULATORY_CARE_PROVIDER_SITE_OTHER): Payer: Medicare Other | Admitting: Family Medicine

## 2015-04-14 ENCOUNTER — Encounter (INDEPENDENT_AMBULATORY_CARE_PROVIDER_SITE_OTHER): Payer: Self-pay

## 2015-04-14 ENCOUNTER — Ambulatory Visit (INDEPENDENT_AMBULATORY_CARE_PROVIDER_SITE_OTHER): Payer: Medicare Other

## 2015-04-14 ENCOUNTER — Encounter: Payer: Self-pay | Admitting: Family Medicine

## 2015-04-14 VITALS — BP 99/64 | HR 66 | Temp 97.1°F | Wt 137.4 lb

## 2015-04-14 DIAGNOSIS — R55 Syncope and collapse: Secondary | ICD-10-CM

## 2015-04-14 DIAGNOSIS — J40 Bronchitis, not specified as acute or chronic: Secondary | ICD-10-CM | POA: Diagnosis not present

## 2015-04-14 DIAGNOSIS — J329 Chronic sinusitis, unspecified: Secondary | ICD-10-CM | POA: Diagnosis not present

## 2015-04-14 MED ORDER — LEVOFLOXACIN 500 MG PO TABS: 500.0000 mg | ORAL_TABLET | Freq: Every day | ORAL | Status: DC

## 2015-04-14 MED ORDER — BETAMETHASONE SOD PHOS & ACET 6 (3-3) MG/ML IJ SUSP
6.0000 mg | Freq: Once | INTRAMUSCULAR | Status: AC
  Administered 2015-04-14: 6 mg via INTRAMUSCULAR

## 2015-04-14 NOTE — Addendum Note (Signed)
Addended by: Pollyann Kennedy F on: 04/14/2015 05:20 PM   Modules accepted: Miquel Dunn

## 2015-04-14 NOTE — Progress Notes (Signed)
Subjective:  Patient ID: Alexandria Chen, female    DOB: 1934/08/25  Age: 80 y.o. MRN: [id number]  CC: URI   HPI JADENE STEMMER presents for Patient presents with upper respiratory congestion. Rhinorrhea that is frequently purulent. There is moderate sore throat. Patient reports coughing frequently. She is also having thick yellow purulent sputum in small. There is no fever no chills no sweats. The patient denies being short of breath. She is able to complete activities of daily living without dyspnea. Onset was about 10 days ago. Seems to have improved moderately since starting on the medication Augmentin.  Additionally the daughter with her today mentions 3 episodes of passing out. One was 5 years ago. It was similar to one that happened about 5 months ago. 5 months ago she was out in the heat looking at his store sites and believes she got overheated and dehydrated and passed out for several minutes. The third episode occurred 8 days ago. It was unclear what might have precipitated the event other than the current illness noted above. She lost consciousness while just sitting in a chair. She was observed to start shaking all over for several moments but was conscious within about 5 minutes. There is no clear postictal state other than a sense of being tired. She was checked by EMS and not transported. There was no associated shortness of breath or chest pain. However she did have a headache. The following day she came to the office and the daughter with her failed to mention the event. Of concern is that she lives alone and what would happen to her if there was nobody there to observe when it happens the next time. History Landrey has a past medical history of HTN (hypertension); Depression; Anxiety; and Alcohol abuse, in remission.   She has no past surgical history on file.   Her family history includes Alcohol abuse in her brother and father; Anxiety disorder in her mother; Dementia in her mother.  There is no history of ADD / ADHD, Drug abuse, Bipolar disorder, Depression, OCD, Paranoid behavior, Schizophrenia, Seizures, Sexual abuse, or Physical abuse.She reports that she has been smoking Cigarettes.  She started smoking about 58 years ago. She has a 50 pack-year smoking history. She has never used smokeless tobacco. She reports that she does not drink alcohol or use illicit drugs.  Current Outpatient Prescriptions on File Prior to Visit  Medication Sig Dispense Refill  . amoxicillin-clavulanate (AUGMENTIN) 875-125 MG tablet Take 1 tablet by mouth 2 (two) times daily. Take all of this medication 20 tablet 0  . aspirin 81 MG tablet Take 1 tablet (81 mg total) by mouth daily. For platelet aggregation. 30 tablet   . atenolol (TENORMIN) 25 MG tablet TAKE 1 TABLET (25 MG TOTAL) BY MOUTH DAILY. 90 tablet 4  . atorvastatin (LIPITOR) 40 MG tablet TAKE 1 TABLET (40 MG TOTAL) BY MOUTH DAILY. FOR HYPERLIPIDEMIA 90 tablet 0  . busPIRone (BUSPAR) 15 MG tablet Take 1 tablet (15 mg total) by mouth 2 (two) times daily. 60 tablet 2  . felodipine (PLENDIL) 10 MG 24 hr tablet TAKE 1 TABLET (10 MG TOTAL) BY MOUTH DAILY. FOR HYPERTENSION. 30 tablet 5  . fish oil-omega-3 fatty acids 1000 MG capsule Take 3 capsules (3 g total) by mouth daily. For hyperlipidemia. 30 capsule   . FLUoxetine (PROZAC) 40 MG capsule Take one capsule daily (68m) by mouth for anxiety and depression. 90 capsule 3  . fluticasone (FLONASE) 50 MCG/ACT nasal spray  Place 2 sprays into both nostrils 2 (two) times daily. Use as needed 16 g 6  . gabapentin (NEURONTIN) 100 MG capsule Take 1 capsule (100 mg total) by mouth 2 (two) times daily. 180 capsule 3  . guaiFENesin-codeine (CHERATUSSIN AC) 100-10 MG/5ML syrup Take 5 mLs by mouth every 4 (four) hours as needed for cough. 180 mL 0  . meclizine (ANTIVERT) 25 MG tablet TAKE 0.5 TABLETS (12.5 MG TOTAL) BY MOUTH 2 (TWO) TIMES DAILY AS NEEDED. 20 tablet 2  . Vitamin D, Cholecalciferol, 400 UNITS  CAPS Take 400 Units by mouth daily.     No current facility-administered medications on file prior to visit.    ROS Review of Systems  Constitutional: Negative for fever, chills, activity change and appetite change.  HENT: Positive for congestion, postnasal drip, rhinorrhea and sinus pressure (frontal). Negative for ear discharge, ear pain, hearing loss, nosebleeds, sneezing and trouble swallowing.   Respiratory: Positive for cough. Negative for chest tightness and shortness of breath.   Cardiovascular: Negative for chest pain and palpitations.  Skin: Negative for rash.    Objective:  BP 99/64 mmHg  Pulse 66  Temp(Src) 97.1 F (36.2 C) (Oral)  Wt 137 lb 6.4 oz (62.324 kg)  SpO2 98%  Physical Exam  Constitutional: She is oriented to person, place, and time. She appears well-developed and well-nourished.  HENT:  Head: Normocephalic and atraumatic.  Right Ear: Tympanic membrane and external ear normal. No decreased hearing is noted.  Left Ear: Tympanic membrane and external ear normal. No decreased hearing is noted.  Nose: Mucosal edema present. Right sinus exhibits no frontal sinus tenderness. Left sinus exhibits no frontal sinus tenderness.  Mouth/Throat: No oropharyngeal exudate or posterior oropharyngeal erythema.  Eyes: Pupils are equal, round, and reactive to light.  Neck: Normal range of motion. No Brudzinski's sign noted.  Pulmonary/Chest: No respiratory distress. She has wheezes (scatterd rhonchi throughout all fields).  Poor exchange with decreased expiratory phase  Lymphadenopathy:       Head (right side): No preauricular adenopathy present.       Head (left side): No preauricular adenopathy present.       Right cervical: No superficial cervical adenopathy present.      Left cervical: No superficial cervical adenopathy present.  Neurological: She is alert and oriented to person, place, and time. She has normal strength and normal reflexes. She is not disoriented. She  displays no tremor. No cranial nerve deficit. She exhibits normal muscle tone. Coordination and gait normal.    Assessment & Plan:   Shantia was seen today for uri.  Diagnoses and all orders for this visit:  Syncope and collapse -     CBC with Differential/Platelet -     CMP14+EGFR -     DG Chest 2 View; Future -     MR Brain W Wo Contrast; Future -     EEG; Future  Sinobronchitis -     betamethasone acetate-betamethasone sodium phosphate (CELESTONE) injection 6 mg; Inject 1 mL (6 mg total) into the muscle once.  Other orders -     levofloxacin (LEVAQUIN) 500 MG tablet; Take 1 tablet (500 mg total) by mouth daily.   I am having Ms. Mastrangelo start on levofloxacin. I am also having her maintain her aspirin, fish oil-omega-3 fatty acids, atenolol, Vitamin D (Cholecalciferol), felodipine, gabapentin, FLUoxetine, busPIRone, fluticasone, meclizine, atorvastatin, amoxicillin-clavulanate, and guaiFENesin-codeine. We will continue to administer betamethasone acetate-betamethasone sodium phosphate.  Meds ordered this encounter  Medications  . levofloxacin (LEVAQUIN)  500 MG tablet    Sig: Take 1 tablet (500 mg total) by mouth daily.    Dispense:  7 tablet    Refill:  0  . betamethasone acetate-betamethasone sodium phosphate (CELESTONE) injection 6 mg    Sig:      Follow-up: Return if symptoms worsen or fail to improve.  Claretta Fraise, M.D.

## 2015-04-14 NOTE — Addendum Note (Signed)
Addended by: Marin Olp on: 04/14/2015 05:03 PM   Modules accepted: Miquel Dunn

## 2015-04-15 LAB — CMP14+EGFR
ALT: 10 IU/L (ref 0–32)
AST: 13 IU/L (ref 0–40)
Albumin/Globulin Ratio: 1.1 (ref 1.1–2.5)
Albumin: 3.9 g/dL (ref 3.5–4.8)
Alkaline Phosphatase: 67 IU/L (ref 39–117)
BUN/Creatinine Ratio: 16 (ref 11–26)
BUN: 11 mg/dL (ref 8–27)
Bilirubin Total: 0.4 mg/dL (ref 0.0–1.2)
CALCIUM: 9.6 mg/dL (ref 8.7–10.3)
CO2: 24 mmol/L (ref 18–29)
CREATININE: 0.68 mg/dL (ref 0.57–1.00)
Chloride: 101 mmol/L (ref 96–106)
GFR, EST AFRICAN AMERICAN: 97 mL/min/{1.73_m2} (ref >59)
GFR, EST NON AFRICAN AMERICAN: 84 mL/min/{1.73_m2} (ref >59)
GLUCOSE: 92 mg/dL (ref 65–99)
Globulin, Total: 3.4 g/dL (ref 1.5–4.5)
Potassium: 3.5 mmol/L (ref 3.5–5.2)
Sodium: 143 mmol/L (ref 134–144)
TOTAL PROTEIN: 7.3 g/dL (ref 6.0–8.5)

## 2015-04-15 LAB — CBC WITH DIFFERENTIAL/PLATELET
BASOS ABS: 0 10*3/uL (ref 0.0–0.2)
Basos: 0 %
EOS (ABSOLUTE): 0.2 10*3/uL (ref 0.0–0.4)
Eos: 3 %
Hematocrit: 37.6 % (ref 34.0–46.6)
Hemoglobin: 12.7 g/dL (ref 11.1–15.9)
Immature Grans (Abs): 0 10*3/uL (ref 0.0–0.1)
Immature Granulocytes: 0 %
Lymphocytes Absolute: 3.3 10*3/uL — ABNORMAL HIGH (ref 0.7–3.1)
Lymphs: 46 %
MCH: 29.3 pg (ref 26.6–33.0)
MCHC: 33.8 g/dL (ref 31.5–35.7)
MCV: 87 fL (ref 79–97)
MONOS ABS: 0.8 10*3/uL (ref 0.1–0.9)
Monocytes: 12 %
Neutrophils Absolute: 2.8 10*3/uL (ref 1.4–7.0)
Neutrophils: 39 %
PLATELETS: 476 10*3/uL — AB (ref 150–379)
RBC: 4.34 x10E6/uL (ref 3.77–5.28)
RDW: 14.3 % (ref 12.3–15.4)
WBC: 7.2 10*3/uL (ref 3.4–10.8)

## 2015-04-16 ENCOUNTER — Other Ambulatory Visit: Payer: Self-pay | Admitting: Family

## 2015-05-02 ENCOUNTER — Ambulatory Visit (HOSPITAL_COMMUNITY)
Admission: RE | Admit: 2015-05-02 | Discharge: 2015-05-02 | Disposition: A | Discharge status: Home / Self Care | Payer: Medicare Other | Source: Ambulatory Visit | Attending: Family Medicine | Admitting: Family Medicine

## 2015-05-02 DIAGNOSIS — R51 Headache: Secondary | ICD-10-CM | POA: Diagnosis not present

## 2015-05-02 DIAGNOSIS — F419 Anxiety disorder, unspecified: Secondary | ICD-10-CM | POA: Diagnosis not present

## 2015-05-02 DIAGNOSIS — R55 Syncope and collapse: Secondary | ICD-10-CM | POA: Diagnosis not present

## 2015-05-02 DIAGNOSIS — I6782 Cerebral ischemia: Secondary | ICD-10-CM | POA: Diagnosis not present

## 2015-05-02 DIAGNOSIS — R42 Dizziness and giddiness: Secondary | ICD-10-CM | POA: Insufficient documentation

## 2015-05-02 MED ORDER — SODIUM CHLORIDE 0.9 % IJ SOLN
INTRAMUSCULAR | Status: AC
  Filled 2015-05-02: qty 250

## 2015-05-02 MED ORDER — GADOBENATE DIMEGLUMINE 529 MG/ML IV SOLN
15.0000 mL | Freq: Once | INTRAVENOUS | Status: AC | PRN
  Administered 2015-05-02: 12 mL via INTRAVENOUS

## 2015-05-15 ENCOUNTER — Ambulatory Visit (HOSPITAL_COMMUNITY): Payer: Self-pay | Admitting: Psychiatry

## 2015-05-29 ENCOUNTER — Telehealth: Payer: Self-pay | Admitting: Family

## 2015-05-30 ENCOUNTER — Ambulatory Visit (HOSPITAL_COMMUNITY): Payer: Self-pay | Admitting: Psychiatry

## 2015-06-05 ENCOUNTER — Ambulatory Visit (INDEPENDENT_AMBULATORY_CARE_PROVIDER_SITE_OTHER): Payer: Medicare Other | Admitting: Psychiatry

## 2015-06-05 ENCOUNTER — Encounter (HOSPITAL_COMMUNITY): Payer: Self-pay | Admitting: Psychiatry

## 2015-06-05 VITALS — BP 171/71 | HR 56 | Ht 61.0 in | Wt 137.4 lb

## 2015-06-05 DIAGNOSIS — F331 Major depressive disorder, recurrent, moderate: Secondary | ICD-10-CM

## 2015-06-05 DIAGNOSIS — F419 Anxiety disorder, unspecified: Secondary | ICD-10-CM

## 2015-06-05 DIAGNOSIS — F1010 Alcohol abuse, uncomplicated: Secondary | ICD-10-CM | POA: Diagnosis not present

## 2015-06-05 MED ORDER — BUSPIRONE HCL 15 MG PO TABS: 15.0000 mg | ORAL_TABLET | Freq: Two times a day (BID) | ORAL | Status: DC

## 2015-06-05 MED ORDER — GABAPENTIN 100 MG PO CAPS: 100.0000 mg | ORAL_CAPSULE | Freq: Two times a day (BID) | ORAL | Status: DC

## 2015-06-05 MED ORDER — FLUOXETINE HCL 40 MG PO CAPS: ORAL_CAPSULE | ORAL | Status: DC

## 2015-06-05 NOTE — Progress Notes (Signed)
Patient ID: Alexandria Chen, female   DOB: 1934/05/26, 81 y.o.   MRN: [id number] Patient ID: Alexandria Chen, female   DOB: [birth year], 81 y.o.   MRN: [id number] Patient ID: Alexandria Chen, female   DOB: 05-04-1934, 81 y.o.   MRN: [id number] Patient ID: Alexandria Chen, female   DOB: Jan 20, 1935, 81 y.o.   MRN: [id number] Patient ID: Alexandria Chen, female   DOB: 08/23/1934, 81 y.o.   MRN: [id number] Patient ID: Alexandria Chen, female   DOB: [birth year], 81 y.o.   MRN: [id number] Patient ID: Alexandria Chen, female   DOB: [birth year], 81 y.o.   MRN: [id number] Patient ID: Alexandria Chen, female   DOB: August 11, 1934, 81 y.o.   MRN: [id number] Patient ID: Alexandria Chen, female   DOB: [birth year], 81 y.o.   MRN: [id number] Patient ID: Alexandria Chen, female   DOB: [birth year], 81 y.o.   MRN: [id number] Alexandria Chen [id number] Progress Note Alexandria Chen MRN: [id number] DOB: 1935/03/27 Age: 81 y.o.  Date: 06/05/2015 Start Time: 10:40 AM End Time: 10:59 AM  Chief Complaint: Chief Complaint  Patient presents with  . Depression  . Anxiety  . Follow-up   Subjective:  "I've been ok"  This patient is a 81 year old widowed black female who lives with her 68 year old grandson in [place]. She is a retired Engineer, materials.  The patient has had a long history of depression anxiety. She's always been an anxious person and thinks she inherited this trait from her mother. She used to abuse alcohol but had stopped for several years until a relapse last year. She was hospitalized in 2013 for alcohol relapse and has not used since. Her 43 year old mother lives across the street and calls her constantly and this makes her nervous. She does state that the addition of Neurontin and BuSpar helped considerably with her anxiety. In general her mood is good, her energy is good and she is sleeping well. She denies any suicidal ideation  Patient returns after 3 months. She is doing ok. Her her 16 year old grandson is living with her as well as her 71-year-old  great granddaughter. She states that the child talks back to her a lot as does the grandson. This makes her nervous but she is hoping they will leave soon. She still thinks that her medications have been helpful but she has "good and bad days." Overall her mood is fairly stable as is her anxiety. Every now and then she has insomnia but most the time she sleeps pretty well.  Current psychiatric medication BuSpar 15 mg 3 times a day Neurontin 100 mg twice a day Prozac 40 mg daily.   BP 171/71 mmHg  Pulse 56  Ht _0  (1.549 m)  Wt 137 lb 6.4 oz (62.324 kg)  BMI 25.97 kg/m2  SpO2 94%  Past psychiatric history Patient was  admitted to behavioral [facility] in June 2013 due to relapse into her alcohol.  She has significant history of depression and alcohol abuse.  She has at least 2 other psychiatric admission at behavioral [facility] which are exacerbated by alcohol intake.   Patient had a good response with Prozac and BuSpar.  She was taking benzodiazepine in the past however due to history of alcohol it was discontinued.  Patient denies any history of paranoia or psychosis.  Allergies: No Known Allergies Medical History: Past Medical History  Diagnosis Date  . HTN (hypertension)   . Depression   . Anxiety   .  Alcohol abuse, in remission   Hypertension.  Patient sees Dr. Ronnald Collum in [place].    Surgical History: History reviewed. No pertinent past surgical history. Family History: family history includes Alcohol abuse in her brother and father; Anxiety disorder in her mother; Dementia in her mother. There is no history of ADD / ADHD, Drug abuse, Bipolar disorder, Depression, OCD, Paranoid behavior, Schizophrenia, Seizures, Sexual abuse, or Physical abuse. Reviewed and no changes noted today.  Psychosocial history Patient lives by herself however his grandson stays sometime with her.  Patient has 4 daughter and one son.  2 of her daughter lives close by.       Alcohol and Substance use history The patient has significant history of drinking alcohol for more than 30 years.  She  has been admitted few times due to relapse into drinking and worsening of depression .  Patient claims to be sober since she released from the hospital.    Mental status examination.  Patient is casually dressed.  She is pleasant cooperative and maintained good eye contact.  Her attention and concentration is  good.  Her speech is clear and coherent.  Her thought process is also logical and goal-directed.  She described her mood as fairly good but anxious and her affect is mood appropriate.  She denies any active or passive suicidal thoughts or homicidal thoughts.  There were no paranoia or delusion present at this time.  There were no tremors or shakes present.  Her attention concentration is good.  She's alert oriented x3.  There were no flight of idea or loose association.  Her insight judgment and pulse control isgood. Fund of knowledge is fair speech is clear and coherent and memory is fair as well  Lab Results:  Results for orders placed or performed in visit on 04/14/15 (from the past 8736 hour(s))  CBC with Differential/Platelet   Collection Time: 04/14/15  5:19 PM  Result Value Ref Range   WBC 7.2 3.4 - 10.8 x10E3/uL   RBC 4.34 3.77 - 5.28 x10E6/uL   Hemoglobin 12.7 11.1 - 15.9 g/dL   Hematocrit 37.6 34.0 - 46.6 %   MCV 87 79 - 97 fL   MCH 29.3 26.6 - 33.0 pg   MCHC 33.8 31.5 - 35.7 g/dL   RDW 14.3 12.3 - 15.4 %   Platelets 476 (H) 150 - 379 x10E3/uL   Neutrophils 39 %   Lymphs 46 %   Monocytes 12 %   Eos 3 %   Basos 0 %   Neutrophils Absolute 2.8 1.4 - 7.0 x10E3/uL   Lymphocytes Absolute 3.3 (H) 0.7 - 3.1 x10E3/uL   Monocytes Absolute 0.8 0.1 - 0.9 x10E3/uL   EOS (ABSOLUTE) 0.2 0.0 - 0.4 x10E3/uL   Basophils Absolute 0.0 0.0 - 0.2 x10E3/uL   Immature Granulocytes 0 %   Immature Grans (Abs) 0.0 0.0 - 0.1 x10E3/uL  CMP14+EGFR   Collection Time: 04/14/15   5:19 PM  Result Value Ref Range   Glucose 92 65 - 99 mg/dL   BUN 11 8 - 27 mg/dL   Creatinine, Ser 0.68 0.57 - 1.00 mg/dL   GFR calc non Af Amer 84 >59 mL/min/1.73   GFR calc Af Amer 97 >59 mL/min/1.73   BUN/Creatinine Ratio 16 11 - 26   Sodium 143 134 - 144 mmol/L   Potassium 3.5 3.5 - 5.2 mmol/L   Chloride 101 96 - 106 mmol/L   CO2 24 18 - 29 mmol/L   Calcium 9.6  8.7 - 10.3 mg/dL   Total Protein 7.3 6.0 - 8.5 g/dL   Albumin 3.9 3.5 - 4.8 g/dL   Globulin, Total 3.4 1.5 - 4.5 g/dL   Albumin/Globulin Ratio 1.1 1.1 - 2.5   Bilirubin Total 0.4 0.0 - 1.2 mg/dL   Alkaline Phosphatase 67 39 - 117 IU/L   AST 13 0 - 40 IU/L   ALT 10 0 - 32 IU/L  Results for orders placed or performed in visit on 12/18/14 (from the past 8736 hour(s))  BMP8+EGFR   Collection Time: 12/18/14  9:11 AM  Result Value Ref Range   Glucose 102 (H) 65 - 99 mg/dL   BUN 11 8 - 27 mg/dL   Creatinine, Ser 0.70 0.57 - 1.00 mg/dL   GFR calc non Af Amer 83 >59 mL/min/1.73   GFR calc Af Amer 96 >59 mL/min/1.73   BUN/Creatinine Ratio 16 11 - 26   Sodium 142 134 - 144 mmol/L   Potassium 3.9 3.5 - 5.2 mmol/L   Chloride 102 97 - 108 mmol/L   CO2 24 18 - 29 mmol/L   Calcium 9.3 8.7 - 10.3 mg/dL  Lipid panel   Collection Time: 12/18/14  9:11 AM  Result Value Ref Range   Cholesterol, Total 168 100 - 199 mg/dL   Triglycerides 59 0 - 149 mg/dL   HDL 40 >39 mg/dL   VLDL Cholesterol Cal 12 5 - 40 mg/dL   LDL Calculated 116 (H) 0 - 99 mg/dL   Chol/HDL Ratio 4.2 0.0 - 4.4 ratio units  Results for orders placed or performed in visit on 06/11/14 (from the past 8736 hour(s))  CMP14+EGFR   Collection Time: 06/11/14  4:38 PM  Result Value Ref Range   Glucose 85 65 - 99 mg/dL   BUN 19 8 - 27 mg/dL   Creatinine, Ser 0.77 0.57 - 1.00 mg/dL   GFR calc non Af Amer 75 >59 mL/min/1.73   GFR calc Af Amer 86 >59 mL/min/1.73   BUN/Creatinine Ratio 25 11 - 26   Sodium 141 134 - 144 mmol/L   Potassium 3.7 3.5 - 5.2 mmol/L   Chloride  102 97 - 108 mmol/L   CO2 26 18 - 29 mmol/L   Calcium 9.7 8.7 - 10.3 mg/dL   Total Protein 7.3 6.0 - 8.5 g/dL   Albumin 4.2 3.5 - 4.8 g/dL   Globulin, Total 3.1 1.5 - 4.5 g/dL   Albumin/Globulin Ratio 1.4 1.1 - 2.5   Bilirubin Total 0.4 0.0 - 1.2 mg/dL   Alkaline Phosphatase 83 39 - 117 IU/L   AST 18 0 - 40 IU/L   ALT 12 0 - 32 IU/L  POCT CBC   Collection Time: 06/11/14  4:48 PM  Result Value Ref Range   WBC 6.6 4.6 - 10.2 K/uL   Lymph, poc 3.5 (A) 0.6 - 3.4   POC LYMPH PERCENT 53.1 (A) 10 - 50 %L   POC Granulocyte 2.5 2 - 6.9   Granulocyte percent 38.3 37 - 80 %G   RBC 4.5 4.04 - 5.48 M/uL   Hemoglobin 13.1 12.2 - 16.2 g/dL   HCT, POC 40.3 37.7 - 47.9 %   MCV 90.3 80 - 97 fL   MCH, POC 29.4 27 - 31.2 pg   MCHC 32.6 31.8 - 35.4 g/dL   RDW, POC 14.3 %   Platelet Count, POC 316.0 142 - 424 K/uL   MPV 6.9 0 - 99.8 fL  PCP draws routine labs and nothing is  emerging as of concern.  Assessment.  Axis I Maj.  Depressive disorder , alcohol abuse.  anxiety disorder NOS  Axis II deferred Axis III hypertension  Axis IV moderate Axis V 60-65  Plan/Discussion: I took her vitals.  I reviewed CC, tobacco/med/surg Hx, meds effects/ side effects, problem list, therapies and responses as well as current situation/symptoms discussed options. He'll continue Prozac for depression and Neurontin for anxiety. She will continue BuSpar 15 mg to 3 times a day for anxiety She'll return in 4 months but if her depression worsens she will call us See orders and pt instructions for more details.  MEDICATIONS this encounter: Meds ordered this encounter  Medications  . gabapentin (NEURONTIN) 100 MG capsule    Sig: Take 1 capsule (100 mg total) by mouth 2 (two) times daily.    Dispense:  180 capsule    Refill:  3  . busPIRone (BUSPAR) 15 MG tablet    Sig: Take 1 tablet (15 mg total) by mouth 2 (two) times daily.    Dispense:  60 tablet    Refill:  3  . FLUoxetine (PROZAC) 40 MG capsule    Sig:  Take one capsule daily (8m) by mouth for anxiety and depression.    Dispense:  90 capsule    Refill:  3   Medical Decision Making Problem Points:  Established problem, stable/improving (1), Review of last therapy session (1) and Review of psycho-social stressors (1) Data Points:  Review or order clinical lab tests (1) Review of medication regiment & side effects (2)  I certify that outpatient services furnished can reasonably be expected to improve the patient's condition.   RLevonne Spiller MD

## 2015-07-02 ENCOUNTER — Other Ambulatory Visit: Payer: Self-pay | Admitting: Family Medicine

## 2015-07-02 NOTE — Telephone Encounter (Signed)
Last seen 06/05/15  Alexandria Chen  Last lipid 12/20/14  Requesting 90 day supply

## 2015-07-17 ENCOUNTER — Other Ambulatory Visit: Payer: Self-pay | Admitting: Family Medicine

## 2015-07-18 NOTE — Telephone Encounter (Signed)
Last seen 04/14/15  [facility]

## 2015-07-21 ENCOUNTER — Other Ambulatory Visit: Payer: Self-pay | Admitting: Nurse Practitioner

## 2015-07-21 NOTE — Telephone Encounter (Signed)
RX ready for pick up 

## 2015-07-21 NOTE — Telephone Encounter (Signed)
Aware, script is ready.

## 2015-08-02 ENCOUNTER — Other Ambulatory Visit: Payer: Self-pay | Admitting: Family Medicine

## 2015-08-08 ENCOUNTER — Other Ambulatory Visit (HOSPITAL_COMMUNITY): Payer: Self-pay | Admitting: Psychiatry

## 2015-08-12 ENCOUNTER — Telehealth (HOSPITAL_COMMUNITY): Payer: Self-pay | Admitting: *Deleted

## 2015-08-12 ENCOUNTER — Other Ambulatory Visit (HOSPITAL_COMMUNITY): Payer: Self-pay | Admitting: Psychiatry

## 2015-08-12 DIAGNOSIS — F331 Major depressive disorder, recurrent, moderate: Secondary | ICD-10-CM

## 2015-08-12 DIAGNOSIS — F419 Anxiety disorder, unspecified: Secondary | ICD-10-CM

## 2015-08-12 MED ORDER — BUSPIRONE HCL 15 MG PO TABS: 15.0000 mg | ORAL_TABLET | Freq: Two times a day (BID) | ORAL | Status: DC

## 2015-08-12 NOTE — Telephone Encounter (Signed)
noted 

## 2015-08-12 NOTE — Telephone Encounter (Signed)
buspar sent in for 90 days

## 2015-08-12 NOTE — Telephone Encounter (Signed)
Pt pharmacy Belenda Cruise) called stating they received a request from pt asking if she could get script called in for 3 months at a time. Per Belenda Cruise, pt have been getting it 1 month at a time and not wants it 3 months. Pharmacy number is [phone].

## 2015-09-04 ENCOUNTER — Emergency Department (HOSPITAL_COMMUNITY)
Admission: EM | Admit: 2015-09-04 | Discharge: 2015-09-04 | Disposition: A | Discharge status: Home / Self Care | Payer: Medicare Other | Attending: Emergency Medicine | Admitting: Emergency Medicine

## 2015-09-04 ENCOUNTER — Encounter (HOSPITAL_COMMUNITY): Payer: Self-pay | Admitting: Emergency Medicine

## 2015-09-04 DIAGNOSIS — F329 Major depressive disorder, single episode, unspecified: Secondary | ICD-10-CM | POA: Diagnosis not present

## 2015-09-04 DIAGNOSIS — R55 Syncope and collapse: Secondary | ICD-10-CM | POA: Diagnosis not present

## 2015-09-04 DIAGNOSIS — F17210 Nicotine dependence, cigarettes, uncomplicated: Secondary | ICD-10-CM | POA: Insufficient documentation

## 2015-09-04 DIAGNOSIS — I10 Essential (primary) hypertension: Secondary | ICD-10-CM | POA: Insufficient documentation

## 2015-09-04 LAB — BASIC METABOLIC PANEL
ANION GAP: 7 (ref 5–15)
BUN: 17 mg/dL (ref 6–20)
CHLORIDE: 103 mmol/L (ref 101–111)
CO2: 27 mmol/L (ref 22–32)
Calcium: 9.5 mg/dL (ref 8.9–10.3)
Creatinine, Ser: 0.89 mg/dL (ref 0.44–1.00)
GFR calc non Af Amer: >60 mL/min (ref >60)
GLUCOSE: 100 mg/dL — AB (ref 65–99)
Potassium: 3.8 mmol/L (ref 3.5–5.1)
Sodium: 137 mmol/L (ref 135–145)

## 2015-09-04 LAB — CBC WITH DIFFERENTIAL/PLATELET
BASOS PCT: 0 %
Basophils Absolute: 0 10*3/uL (ref 0.0–0.1)
Eosinophils Absolute: 0.1 10*3/uL (ref 0.0–0.7)
Eosinophils Relative: 1 %
HEMATOCRIT: 40.7 % (ref 36.0–46.0)
HEMOGLOBIN: 13.5 g/dL (ref 12.0–15.0)
LYMPHS ABS: 1.6 10*3/uL (ref 0.7–4.0)
LYMPHS PCT: 19 %
MCH: 29.7 pg (ref 26.0–34.0)
MCHC: 33.2 g/dL (ref 30.0–36.0)
MCV: 89.5 fL (ref 78.0–100.0)
MONOS PCT: 7 %
Monocytes Absolute: 0.6 10*3/uL (ref 0.1–1.0)
NEUTROS ABS: 6.3 10*3/uL (ref 1.7–7.7)
NEUTROS PCT: 73 %
Platelets: 283 10*3/uL (ref 150–400)
RBC: 4.55 MIL/uL (ref 3.87–5.11)
RDW: 14.3 % (ref 11.5–15.5)
WBC: 8.5 10*3/uL (ref 4.0–10.5)

## 2015-09-04 LAB — TROPONIN I: Troponin I: <0.03 ng/mL (ref <0.031)

## 2015-09-04 MED ORDER — SODIUM CHLORIDE 0.9 % IV BOLUS (SEPSIS)
1000.0000 mL | Freq: Once | INTRAVENOUS | Status: AC
  Administered 2015-09-04: 1000 mL via INTRAVENOUS

## 2015-09-04 NOTE — ED Notes (Signed)
Passed out at [employer].  Pt says she got hot when getting a comforter out of the dryer.  She remembers waking on the floor, EMS.  Witness by bystander, who says pt passed out and look like she was having a seizure and pt voided and stool on self.

## 2015-09-04 NOTE — ED Notes (Signed)
EDP at bedside  

## 2015-09-04 NOTE — Discharge Instructions (Signed)
Syncope Syncope means a person passes out (faints). The person usually wakes up in less than 5 minutes. It is important to seek medical care for syncope. HOME CARE  Have someone stay with you until you feel normal.  Do not drive, use machines, or play sports until your doctor says it is okay.  Keep all doctor visits as told.  Lie down when you feel like you might pass out. Take deep breaths. Wait until you feel normal before standing up.  Drink enough fluids to keep your pee (urine) clear or pale yellow.  If you take blood pressure or heart medicine, get up slowly. Take several minutes to sit and then stand. GET HELP RIGHT AWAY IF:   You have a severe headache.  You have pain in the chest, belly (abdomen), or back.  You are bleeding from the mouth or butt (rectum).  You have black or tarry poop (stool).  You have an irregular or very fast heartbeat.  You have pain with breathing.  You keep passing out, or you have shaking (seizures) when you pass out.  You pass out when sitting or lying down.  You feel confused.  You have trouble walking.  You have severe weakness.  You have vision problems. If you fainted, call for help (911 in [place] Do not drive yourself to the hospital.   This information is not intended to replace advice given to you by your health care provider. Make sure you discuss any questions you have with your health care provider.   Document Released: 10/06/2007 Document Revised: 09/03/2014 Document Reviewed: 06/18/2011 Elsevier Interactive Patient Education [employer].   Tests were good. Increase fluids. Eat regular meals. Follow-up your primary care doctor.

## 2015-09-04 NOTE — ED Provider Notes (Signed)
CSN: [id number]     Arrival date & time 09/04/15  1610 History   First MD Initiated Contact with Patient 09/04/15 1624     Chief Complaint  Patient presents with  . Loss of Consciousness     (Consider location/radiation/quality/duration/timing/severity/associated sxs/prior Treatment) HPI.Marland KitchenMarland KitchenMarland KitchenPatient [place] in the [place] when she became overheated and passed out. She woke up on the floor. She had bowel and bladder incontinence. Questionable seizure. Daughter reports she is acting normally now. No prodromal illnesses. MRI of the brain in December 2016 was normal.  Past Medical History  Diagnosis Date  . HTN (hypertension)   . Depression   . Anxiety   . Alcohol abuse, in remission    History reviewed. No pertinent past surgical history. Family History  Problem Relation Age of Onset  . Anxiety disorder Mother   . Dementia Mother   . Alcohol abuse Father   . Alcohol abuse Brother   . ADD / ADHD Neg Hx   . Drug abuse Neg Hx   . Bipolar disorder Neg Hx   . Depression Neg Hx   . OCD Neg Hx   . Paranoid behavior Neg Hx   . Schizophrenia Neg Hx   . Seizures Neg Hx   . Sexual abuse Neg Hx   . Physical abuse Neg Hx    Social History  Substance Use Topics  . Smoking status: Current Every Day Smoker -- 1.00 packs/day for 50 years    Types: Cigarettes    Start date: 05/03/1956  . Smokeless tobacco: Never Used     Comment: about a packa day as of 10/17/2012  . Alcohol Use: No     Comment: 6 24oz beers /day   OB History    No data available     Review of Systems  All other systems reviewed and are negative.     Allergies  Review of patient's allergies indicates no known allergies.  Home Medications   Prior to Admission medications   Medication Sig Start Date End Date Taking? Authorizing Provider  amoxicillin-clavulanate (AUGMENTIN) 875-125 MG tablet Take 1 tablet by mouth 2 (two) times daily. Take all of this medication 04/07/15   Claretta Fraise, MD   aspirin 81 MG tablet Take 1 tablet (81 mg total) by mouth daily. For platelet aggregation. 10/19/11   Ruben Im, PA-C  atenolol (TENORMIN) 25 MG tablet TAKE 1 TABLET (25 MG TOTAL) BY MOUTH DAILY. 04/16/15   Claretta Fraise, MD  atorvastatin (LIPITOR) 40 MG tablet TAKE 1 TABLET (40 MG TOTAL) BY MOUTH DAILY. FOR HYPERLIPIDEMIA 07/02/15   [name], FNP  atorvastatin (LIPITOR) 40 MG tablet TAKE 1 TABLET (40 MG TOTAL) BY MOUTH DAILY. FOR HYPERLIPIDEMIA 08/04/15   [name], FNP  busPIRone (BUSPAR) 15 MG tablet Take 1 tablet (15 mg total) by mouth 2 (two) times daily. 08/12/15   Cloria Spring, MD  CHERATUSSIN AC 100-10 MG/5ML syrup TAKE 1 TEASPOONFUL BY MOUTH EVERY 4 HOURS AS NEEDED FOR COUGH 07/21/15   [name], FNP  felodipine (PLENDIL) 10 MG 24 hr tablet TAKE 1 TABLET (10 MG TOTAL) BY MOUTH DAILY. FOR HYPERTENSION. 01/07/15   Fransisca Kaufmann Dettinger, MD  fish oil-omega-3 fatty acids 1000 MG capsule Take 3 capsules (3 g total) by mouth daily. For hyperlipidemia. 10/19/11   Ruben Im, PA-C  FLUoxetine (PROZAC) 40 MG capsule Take one capsule daily ('40mg'$ ) by mouth for anxiety and depression. 06/05/15   Cloria Spring, MD  fluticasone (FLONASE) 50 MCG/ACT nasal spray Place 2 sprays into both nostrils 2 (two) times daily. Use as needed 03/11/15   [name], FNP  gabapentin (NEURONTIN) 100 MG capsule Take 1 capsule (100 mg total) by mouth 2 (two) times daily. 06/05/15 06/04/16  Cloria Spring, MD  levofloxacin (LEVAQUIN) 500 MG tablet Take 1 tablet (500 mg total) by mouth daily. 04/14/15   Claretta Fraise, MD  meclizine (ANTIVERT) 25 MG tablet TAKE 0.5 TABLETS (12.5 MG TOTAL) BY MOUTH 2 (TWO) TIMES DAILY AS NEEDED. 03/11/15   [name], FNP  Vitamin D, Cholecalciferol, 400 UNITS CAPS Take 400 Units by mouth daily.    Historical Provider, MD   BP 146/74 mmHg  Pulse 67  Temp(Src) 98.2 F (36.8 C) (Temporal)  Resp 18  Ht '5\' 1"'$  (1.549 m)  Wt 142 lb (64.411 kg)  BMI 26.84 kg/m2  SpO2  93% Physical Exam  Constitutional: She is oriented to person, place, and time. She appears well-developed and well-nourished.  HENT:  Head: Normocephalic and atraumatic.  Eyes: Conjunctivae and EOM are normal. Pupils are equal, round, and reactive to light.  Neck: Normal range of motion. Neck supple.  Cardiovascular: Normal rate and regular rhythm.   Pulmonary/Chest: Effort normal and breath sounds normal.  Abdominal: Soft. Bowel sounds are normal.  Musculoskeletal: Normal range of motion.  Neurological: She is alert and oriented to person, place, and time.  Skin: Skin is warm and dry.  Psychiatric: She has a normal mood and affect. Her behavior is normal.  Nursing note and vitals reviewed.   ED Course  Procedures (including critical care time) Labs Review Labs Reviewed  BASIC METABOLIC PANEL - Abnormal; Notable for the following:    Glucose, Bld 100 (*)    All other components within normal limits  TROPONIN I  CBC WITH DIFFERENTIAL/PLATELET    Imaging Review No results found. I have personally reviewed and evaluated these images and lab results as part of my medical decision-making.   EKG Interpretation   Date/Time:  Thursday Sep 04 2015 16:41:07 EDT Ventricular Rate:  63 PR Interval:  134 QRS Duration: 121 QT Interval:  409 QTC Calculation: 419 R Axis:   11 Text Interpretation:  Sinus rhythm Probable left ventricular hypertrophy  Confirmed by Lacinda Axon  MD, Marlea Gambill ([id number]) on 09/04/2015 4:51:32 PM      MDM   Final diagnoses:  Syncope, unspecified syncope type    Patient is alert and oriented 3. Daughter reports normal behavior. Screening labs, EKG all normal. MRI of brain reviewed from December 2016.    Nat Christen, MD 09/04/15 1910

## 2015-09-05 ENCOUNTER — Other Ambulatory Visit: Payer: Self-pay | Admitting: Family

## 2015-09-05 ENCOUNTER — Other Ambulatory Visit (HOSPITAL_COMMUNITY): Payer: Self-pay | Admitting: Psychiatry

## 2015-09-24 ENCOUNTER — Other Ambulatory Visit: Payer: Self-pay | Admitting: Family Medicine

## 2015-10-03 ENCOUNTER — Ambulatory Visit (INDEPENDENT_AMBULATORY_CARE_PROVIDER_SITE_OTHER): Payer: Medicare Other | Admitting: Psychiatry

## 2015-10-03 ENCOUNTER — Encounter (HOSPITAL_COMMUNITY): Payer: Self-pay | Admitting: Psychiatry

## 2015-10-03 VITALS — BP 151/80 | HR 64 | Ht 61.0 in | Wt 128.4 lb

## 2015-10-03 DIAGNOSIS — F419 Anxiety disorder, unspecified: Secondary | ICD-10-CM | POA: Diagnosis not present

## 2015-10-03 DIAGNOSIS — F1010 Alcohol abuse, uncomplicated: Secondary | ICD-10-CM

## 2015-10-03 DIAGNOSIS — F331 Major depressive disorder, recurrent, moderate: Secondary | ICD-10-CM | POA: Diagnosis not present

## 2015-10-03 MED ORDER — BUSPIRONE HCL 15 MG PO TABS: 15.0000 mg | ORAL_TABLET | Freq: Two times a day (BID) | ORAL | Status: DC

## 2015-10-03 MED ORDER — FLUOXETINE HCL 40 MG PO CAPS: ORAL_CAPSULE | ORAL | Status: DC

## 2015-10-03 MED ORDER — CLONAZEPAM 0.5 MG PO TABS: 0.5000 mg | ORAL_TABLET | Freq: Two times a day (BID) | ORAL | Status: DC | PRN

## 2015-10-03 MED ORDER — GABAPENTIN 100 MG PO CAPS: 100.0000 mg | ORAL_CAPSULE | Freq: Two times a day (BID) | ORAL | Status: DC

## 2015-10-03 NOTE — Progress Notes (Signed)
Patient ID: MACI EICKHOLT, female   DOB: 03-26-1935, 81 y.o.   MRN: [id number] Patient ID: KIMETHA TRULSON, female   DOB: 21-May-1934, 81 y.o.   MRN: [id number] Patient ID: MACIE BAUM, female   DOB: [birth year], 81 y.o.   MRN: [id number] Patient ID: AMEA MCPHAIL, female   DOB: May 23, 1934, 81 y.o.   MRN: [id number] Patient ID: DAY GREB, female   DOB: 24-Oct-1934, 81 y.o.   MRN: [id number] Patient ID: ALAZAY LEICHT, female   DOB: 06/04/1934, 81 y.o.   MRN: [id number] Patient ID: TAYLORANN TKACH, female   DOB: [birth year], 81 y.o.   MRN: [id number] Patient ID: KYMBERLYN ECKFORD, female   DOB: 26-Dec-1934, 81 y.o.   MRN: [id number] Patient ID: EMBERLYNN RIGGAN, female   DOB: [birth year], 81 y.o.   MRN: [id number] Patient ID: SHEALEIGH DUNSTAN, female   DOB: October 06, 1934, 81 y.o.   MRN: [id number] Patient ID: KYNISHA MEMON, female   DOB: 02-02-1935, 81 y.o.   MRN: [id number] [facility] [address] Progress Note TANISH SINKLER MRN: [id number] DOB: March 24, 1935 Age: 81 y.o.  Date: 10/03/2015 Start Time: 10:40 AM End Time: 10:59 AM  Chief Complaint: Chief Complaint  Patient presents with  . Depression  . Anxiety  . Follow-up   Subjective:  "I've been jittery"  This patient is a 81 year old widowed black female who lives with her 52 year old grandson in [place]. She is a retired Engineer, materials.  The patient has had a long history of depression anxiety. She's always been an anxious person and thinks she inherited this trait from her mother. She used to abuse alcohol but had stopped for several years until a relapse last year. She was hospitalized in 2013 for alcohol relapse and has not used since. Her 77 year old mother lives across the street and calls her constantly and this makes her nervous. She does state that the addition of Neurontin and BuSpar helped considerably with her anxiety. In general her mood is good, her energy is good and she is sleeping well. She denies any suicidal ideation  Patient returns after 3 months. She  states that she has been more jittery lately and anxious. She hasn't felt like getting out much. About a month ago she had a falling out spell in the [place]. She is brought to the ED and brought all her labs were normal and she seemed to come back to herself fairly quickly. A brain MRI done last December showed a good deal of small vessel disease but no other new findings. She does state that she's very anxious in the mornings and I suggested we add a little bit of clonazepam only as needed and she agrees. She's no longer drinking but still smokes at least a pack a day of cigarettes  Current psychiatric medication BuSpar 15 mg 3 times a day Neurontin 100 mg twice a day Prozac 40 mg daily.   BP 151/80 mmHg  Pulse 64  Ht '5\' 1"'  (1.549 m)  Wt 128 lb 6.4 oz (58.242 kg)  BMI 24.27 kg/m2  Past psychiatric history Patient was  admitted to behavioral [facility] in June 2013 due to relapse into her alcohol.  She has significant history of depression and alcohol abuse.  She has at least 2 other psychiatric admission at behavioral [facility] which are exacerbated by alcohol intake.   Patient had a good response with Prozac and BuSpar.  She was taking benzodiazepine in the past however due to history of alcohol  it was discontinued.  Patient denies any history of paranoia or psychosis.  Allergies: No Known Allergies Medical History: Past Medical History  Diagnosis Date  . HTN (hypertension)   . Depression   . Anxiety   . Alcohol abuse, in remission   Hypertension.  Patient sees Dr. Ronnald Collum in [place].    Surgical History: No past surgical history on file. Family History: family history includes Alcohol abuse in her brother and father; Anxiety disorder in her mother; Dementia in her mother. There is no history of ADD / ADHD, Drug abuse, Bipolar disorder, Depression, OCD, Paranoid behavior, Schizophrenia, Seizures, Sexual abuse, or Physical abuse. Reviewed and no changes  noted today.  Psychosocial history Patient lives by herself however his grandson stays sometime with her.  Patient has 4 daughter and one son.  2 of her daughter lives close by.      Alcohol and Substance use history The patient has significant history of drinking alcohol for more than 30 years.  She  has been admitted few times due to relapse into drinking and worsening of depression .  Patient claims to be sober since she released from the hospital.    Mental status examination.  Patient is casually dressed.  She is pleasant cooperative and maintained good eye contact.  Her attention and concentration is  good.  Her speech is clear and coherent.  Her thought process is also logical and goal-directed.  She described her mood asanxious and her affect is mood appropriate.  She denies any active or passive suicidal thoughts or homicidal thoughts.  There were no paranoia or delusion present at this time.  There were no tremors although her hands are shaky.  Her attention concentration is good.  She's alert oriented x3.  There were no flight of idea or loose association.  Her insight judgment and pulse control isgood. Fund of knowledge is fair speech is clear and coherent and memory is fair as well  Lab Results:  Results for orders placed or performed during the hospital encounter of 09/04/15 (from the past 8736 hour(s))  Troponin I   Collection Time: 09/04/15  5:00 PM  Result Value Ref Range   Troponin I <0.03 <0.031 ng/mL  Basic metabolic panel   Collection Time: 09/04/15  5:00 PM  Result Value Ref Range   Sodium 137 135 - 145 mmol/L   Potassium 3.8 3.5 - 5.1 mmol/L   Chloride 103 101 - 111 mmol/L   CO2 27 22 - 32 mmol/L   Glucose, Bld 100 (H) 65 - 99 mg/dL   BUN 17 6 - 20 mg/dL   Creatinine, Ser 0.89 0.44 - 1.00 mg/dL   Calcium 9.5 8.9 - 10.3 mg/dL   GFR calc non Af Amer >60 >60 mL/min   GFR calc Af Amer >60 >60 mL/min   Anion gap 7 5 - 15  CBC with Differential   Collection Time:  09/04/15  5:00 PM  Result Value Ref Range   WBC 8.5 4.0 - 10.5 K/uL   RBC 4.55 3.87 - 5.11 MIL/uL   Hemoglobin 13.5 12.0 - 15.0 g/dL   HCT 40.7 36.0 - 46.0 %   MCV 89.5 78.0 - 100.0 fL   MCH 29.7 26.0 - 34.0 pg   MCHC 33.2 30.0 - 36.0 g/dL   RDW 14.3 11.5 - 15.5 %   Platelets 283 150 - 400 K/uL   Neutrophils Relative % 73 %   Neutro Abs 6.3 1.7 - 7.7 K/uL  Lymphocytes Relative 19 %   Lymphs Abs 1.6 0.7 - 4.0 K/uL   Monocytes Relative 7 %   Monocytes Absolute 0.6 0.1 - 1.0 K/uL   Eosinophils Relative 1 %   Eosinophils Absolute 0.1 0.0 - 0.7 K/uL   Basophils Relative 0 %   Basophils Absolute 0.0 0.0 - 0.1 K/uL  Results for orders placed or performed in visit on 04/14/15 (from the past 8736 hour(s))  CBC with Differential/Platelet   Collection Time: 04/14/15  5:19 PM  Result Value Ref Range   WBC 7.2 3.4 - 10.8 x10E3/uL   RBC 4.34 3.77 - 5.28 x10E6/uL   Hemoglobin 12.7 11.1 - 15.9 g/dL   Hematocrit 37.6 34.0 - 46.6 %   MCV 87 79 - 97 fL   MCH 29.3 26.6 - 33.0 pg   MCHC 33.8 31.5 - 35.7 g/dL   RDW 14.3 12.3 - 15.4 %   Platelets 476 (H) 150 - 379 x10E3/uL   Neutrophils 39 %   Lymphs 46 %   Monocytes 12 %   Eos 3 %   Basos 0 %   Neutrophils Absolute 2.8 1.4 - 7.0 x10E3/uL   Lymphocytes Absolute 3.3 (H) 0.7 - 3.1 x10E3/uL   Monocytes Absolute 0.8 0.1 - 0.9 x10E3/uL   EOS (ABSOLUTE) 0.2 0.0 - 0.4 x10E3/uL   Basophils Absolute 0.0 0.0 - 0.2 x10E3/uL   Immature Granulocytes 0 %   Immature Grans (Abs) 0.0 0.0 - 0.1 x10E3/uL  CMP14+EGFR   Collection Time: 04/14/15  5:19 PM  Result Value Ref Range   Glucose 92 65 - 99 mg/dL   BUN 11 8 - 27 mg/dL   Creatinine, Ser 0.68 0.57 - 1.00 mg/dL   GFR calc non Af Amer 84 >59 mL/min/1.73   GFR calc Af Amer 97 >59 mL/min/1.73   BUN/Creatinine Ratio 16 11 - 26   Sodium 143 134 - 144 mmol/L   Potassium 3.5 3.5 - 5.2 mmol/L   Chloride 101 96 - 106 mmol/L   CO2 24 18 - 29 mmol/L   Calcium 9.6 8.7 - 10.3 mg/dL   Total Protein 7.3 6.0 -  8.5 g/dL   Albumin 3.9 3.5 - 4.8 g/dL   Globulin, Total 3.4 1.5 - 4.5 g/dL   Albumin/Globulin Ratio 1.1 1.1 - 2.5   Bilirubin Total 0.4 0.0 - 1.2 mg/dL   Alkaline Phosphatase 67 39 - 117 IU/L   AST 13 0 - 40 IU/L   ALT 10 0 - 32 IU/L  Results for orders placed or performed in visit on 12/18/14 (from the past 8736 hour(s))  BMP8+EGFR   Collection Time: 12/18/14  9:11 AM  Result Value Ref Range   Glucose 102 (H) 65 - 99 mg/dL   BUN 11 8 - 27 mg/dL   Creatinine, Ser 0.70 0.57 - 1.00 mg/dL   GFR calc non Af Amer 83 >59 mL/min/1.73   GFR calc Af Amer 96 >59 mL/min/1.73   BUN/Creatinine Ratio 16 11 - 26   Sodium 142 134 - 144 mmol/L   Potassium 3.9 3.5 - 5.2 mmol/L   Chloride 102 97 - 108 mmol/L   CO2 24 18 - 29 mmol/L   Calcium 9.3 8.7 - 10.3 mg/dL  Lipid panel   Collection Time: 12/18/14  9:11 AM  Result Value Ref Range   Cholesterol, Total 168 100 - 199 mg/dL   Triglycerides 59 0 - 149 mg/dL   HDL 40 >39 mg/dL   VLDL Cholesterol Cal 12 5 - 40 mg/dL  LDL Calculated 116 (H) 0 - 99 mg/dL   Chol/HDL Ratio 4.2 0.0 - 4.4 ratio units  PCP draws routine labs and nothing is emerging as of concern.  Assessment.  Axis I Maj.  Depressive disorder , alcohol abuse.  anxiety disorder NOS  Axis II deferred Axis III hypertension  Axis IV moderate Axis V 60-65  Plan/Discussion: I took her vitals.  I reviewed CC, tobacco/med/surg Hx, meds effects/ side effects, problem list, therapies and responses as well as current situation/symptoms discussed options. He'll continue Prozac for depression and Neurontin for anxiety. She will continue BuSpar 15 mg to 3 times a day for anxiety.Clonazepam 0.5 mg up to 2 a day has been ordered for anxiety She'll return in 3 months but if her depression worsens she will call us See orders and pt instructions for more details.  MEDICATIONS this encounter: Meds ordered this encounter  Medications  . FLUoxetine (PROZAC) 40 MG capsule    Sig: Take one capsule  daily (95m) by mouth for anxiety and depression.    Dispense:  90 capsule    Refill:  3  . busPIRone (BUSPAR) 15 MG tablet    Sig: Take 1 tablet (15 mg total) by mouth 2 (two) times daily.    Dispense:  180 tablet    Refill:  2  . gabapentin (NEURONTIN) 100 MG capsule    Sig: Take 1 capsule (100 mg total) by mouth 2 (two) times daily.    Dispense:  180 capsule    Refill:  3  . clonazePAM (KLONOPIN) 0.5 MG tablet    Sig: Take 1 tablet (0.5 mg total) by mouth 2 (two) times daily as needed for anxiety.    Dispense:  60 tablet    Refill:  3   Medical Decision Making Problem Points:  Established problem, stable/improving (1), Review of last therapy session (1) and Review of psycho-social stressors (1) Data Points:  Review or order clinical lab tests (1) Review of medication regiment & side effects (2)  I certify that outpatient services furnished can reasonably be expected to improve the patient's condition.   RLevonne Spiller MD

## 2015-10-19 ENCOUNTER — Encounter (HOSPITAL_COMMUNITY): Payer: Self-pay | Admitting: Emergency Medicine

## 2015-10-19 ENCOUNTER — Emergency Department (HOSPITAL_COMMUNITY)
Admission: EM | Admit: 2015-10-19 | Discharge: 2015-10-19 | Disposition: A | Discharge status: Home / Self Care | Payer: Medicare Other | Attending: Emergency Medicine | Admitting: Emergency Medicine

## 2015-10-19 DIAGNOSIS — F329 Major depressive disorder, single episode, unspecified: Secondary | ICD-10-CM | POA: Insufficient documentation

## 2015-10-19 DIAGNOSIS — Z7982 Long term (current) use of aspirin: Secondary | ICD-10-CM | POA: Insufficient documentation

## 2015-10-19 DIAGNOSIS — R404 Transient alteration of awareness: Secondary | ICD-10-CM | POA: Diagnosis not present

## 2015-10-19 DIAGNOSIS — R55 Syncope and collapse: Secondary | ICD-10-CM | POA: Insufficient documentation

## 2015-10-19 DIAGNOSIS — F17210 Nicotine dependence, cigarettes, uncomplicated: Secondary | ICD-10-CM | POA: Insufficient documentation

## 2015-10-19 DIAGNOSIS — Z79899 Other long term (current) drug therapy: Secondary | ICD-10-CM | POA: Insufficient documentation

## 2015-10-19 DIAGNOSIS — I10 Essential (primary) hypertension: Secondary | ICD-10-CM | POA: Diagnosis not present

## 2015-10-19 DIAGNOSIS — R001 Bradycardia, unspecified: Secondary | ICD-10-CM | POA: Diagnosis not present

## 2015-10-19 LAB — BASIC METABOLIC PANEL
ANION GAP: 5 (ref 5–15)
BUN: 15 mg/dL (ref 6–20)
CO2: 24 mmol/L (ref 22–32)
CREATININE: 0.74 mg/dL (ref 0.44–1.00)
Calcium: 8.5 mg/dL — ABNORMAL LOW (ref 8.9–10.3)
Chloride: 106 mmol/L (ref 101–111)
Glucose, Bld: 88 mg/dL (ref 65–99)
Potassium: 3.9 mmol/L (ref 3.5–5.1)
Sodium: 135 mmol/L (ref 135–145)

## 2015-10-19 LAB — CBC WITH DIFFERENTIAL/PLATELET
BASOS PCT: 0 %
Basophils Absolute: 0 10*3/uL (ref 0.0–0.1)
Eosinophils Absolute: 0.1 10*3/uL (ref 0.0–0.7)
Eosinophils Relative: 1 %
HEMATOCRIT: 35.6 % — AB (ref 36.0–46.0)
HEMOGLOBIN: 12.1 g/dL (ref 12.0–15.0)
LYMPHS PCT: 29 %
Lymphs Abs: 1.8 10*3/uL (ref 0.7–4.0)
MCH: 30.1 pg (ref 26.0–34.0)
MCHC: 34 g/dL (ref 30.0–36.0)
MCV: 88.6 fL (ref 78.0–100.0)
MONOS PCT: 8 %
Monocytes Absolute: 0.5 10*3/uL (ref 0.1–1.0)
NEUTROS ABS: 3.7 10*3/uL (ref 1.7–7.7)
NEUTROS PCT: 62 %
Platelets: 281 10*3/uL (ref 150–400)
RBC: 4.02 MIL/uL (ref 3.87–5.11)
RDW: 14.4 % (ref 11.5–15.5)
WBC: 6 10*3/uL (ref 4.0–10.5)

## 2015-10-19 LAB — TROPONIN I

## 2015-10-19 MED ORDER — SODIUM CHLORIDE 0.9 % IV BOLUS (SEPSIS)
1000.0000 mL | Freq: Once | INTRAVENOUS | Status: AC
  Administered 2015-10-19: 1000 mL via INTRAVENOUS

## 2015-10-19 NOTE — ED Notes (Signed)
Per EMS, pt had [place]. EMS stated that it was hot in the church. Family reports to EMS that pt has had multiple syncopal episodes in the past few months. States she has been evaluated by PCP for episodes with no results. EMS reports pt has been in sinus brady. HR in 40s with no pmh of bradycardia. CBG 130. Pt AOX4

## 2015-10-19 NOTE — Discharge Instructions (Signed)
Initially, your heart rate was slightly slow. You will need cardiology follow-up. Phone number given locally. Increase fluids. Stop smoking.

## 2015-10-19 NOTE — ED Provider Notes (Signed)
CSN: [id number]     Arrival date & time 10/19/15  1258 History   First MD Initiated Contact with Patient 10/19/15 1331     Chief Complaint  Patient presents with  . Loss of Consciousness     (Consider location/radiation/quality/duration/timing/severity/associated sxs/prior Treatment) HPI....Marland KitchenMarland KitchenLevel V caveat for mental health disorder.  Syncopal spell sitting in church today. Patient was "out" for approximately 5 minutes. She spontaneously awoke. She has had several syncopal spells in the past, but no one has ever "figured it out". She is now back to baseline. No chest pain, dyspnea, neurodeficits, fever, sweats, chills. She is uncertain what medications she is currently taking.  Past Medical History  Diagnosis Date  . HTN (hypertension)   . Depression   . Anxiety   . Alcohol abuse, in remission    History reviewed. No pertinent past surgical history. Family History  Problem Relation Age of Onset  . Anxiety disorder Mother   . Dementia Mother   . Alcohol abuse Father   . Alcohol abuse Brother   . ADD / ADHD Neg Hx   . Drug abuse Neg Hx   . Bipolar disorder Neg Hx   . Depression Neg Hx   . OCD Neg Hx   . Paranoid behavior Neg Hx   . Schizophrenia Neg Hx   . Seizures Neg Hx   . Sexual abuse Neg Hx   . Physical abuse Neg Hx    Social History  Substance Use Topics  . Smoking status: Current Every Day Smoker -- 1.00 packs/day for 50 years    Types: Cigarettes    Start date: 05/03/1956  . Smokeless tobacco: Never Used     Comment: about a packa day as of 10/17/2012  . Alcohol Use: No     Comment: 6 24oz beers /day   OB History    No data available     Review of Systems  Reason unable to perform ROS: Mental health disorder.      Allergies  Review of patient's allergies indicates no known allergies.  Home Medications   Prior to Admission medications   Medication Sig Start Date End Date Taking? Authorizing Provider  aspirin 81 MG tablet Take 1 tablet (81 mg total)  by mouth daily. For platelet aggregation. 10/19/11  Yes Milta Deiters T Mashburn, PA-C  atenolol (TENORMIN) 25 MG tablet TAKE 1 TABLET (25 MG TOTAL) BY MOUTH DAILY. 04/16/15  Yes Claretta Fraise, MD  atorvastatin (LIPITOR) 40 MG tablet TAKE 1 TABLET (40 MG TOTAL) BY MOUTH DAILY. FOR HYPERLIPIDEMIA 07/02/15  Yes [name], FNP  busPIRone (BUSPAR) 15 MG tablet Take 1 tablet (15 mg total) by mouth 2 (two) times daily. 10/03/15  Yes Cloria Spring, MD  CHERATUSSIN AC 100-10 MG/5ML syrup TAKE 1 TEASPOONFUL BY MOUTH EVERY 4 HOURS AS NEEDED FOR COUGH 07/21/15  Yes [name], FNP  clonazePAM (KLONOPIN) 0.5 MG tablet Take 1 tablet (0.5 mg total) by mouth 2 (two) times daily as needed for anxiety. 10/03/15  Yes Cloria Spring, MD  felodipine (PLENDIL) 10 MG 24 hr tablet TAKE 1 TABLET (10 MG TOTAL) BY MOUTH DAILY. FOR HYPERTENSION. 09/25/15  Yes [name], FNP  fish oil-omega-3 fatty acids 1000 MG capsule Take 3 capsules (3 g total) by mouth daily. For hyperlipidemia. 10/19/11  Yes Milta Deiters T Mashburn, PA-C  FLUoxetine (PROZAC) 40 MG capsule Take one capsule daily ('40mg'$ ) by mouth for anxiety and depression. 10/03/15  Yes Cloria Spring, MD  fluticasone [facility]) 50  MCG/ACT nasal spray Place 2 sprays into both nostrils 2 (two) times daily. Use as needed 03/11/15  Yes [name], FNP  gabapentin (NEURONTIN) 100 MG capsule Take 1 capsule (100 mg total) by mouth 2 (two) times daily. 10/03/15 10/02/16 Yes Cloria Spring, MD  meclizine (ANTIVERT) 25 MG tablet TAKE 0.5 TABLETS (12.5 MG TOTAL) BY MOUTH 2 (TWO) TIMES DAILY AS NEEDED. 09/08/15  Yes [name], FNP  Vitamin D, Cholecalciferol, 400 UNITS CAPS Take 400 Units by mouth daily.   Yes Historical Provider, MD   BP 136/69 mmHg  Pulse 58  Temp(Src) 98.2 F (36.8 C) (Oral)  Resp 12  Wt 127 lb (57.607 kg)  SpO2 98% Physical Exam  Constitutional: She is oriented to person, place, and time. She appears well-developed and well-nourished.  HENT:  Head: Normocephalic  and atraumatic.  Eyes: Conjunctivae and EOM are normal. Pupils are equal, round, and reactive to light.  Neck: Normal range of motion. Neck supple.  Cardiovascular: Regular rhythm.   Bradycardic  Pulmonary/Chest: Effort normal and breath sounds normal.  Abdominal: Soft. Bowel sounds are normal.  Musculoskeletal: Normal range of motion.  Neurological: She is alert and oriented to person, place, and time.  Skin: Skin is warm and dry.  Psychiatric: She has a normal mood and affect. Her behavior is normal.  Nursing note and vitals reviewed.   ED Course  Procedures (including critical care time) Labs Review Labs Reviewed  CBC WITH DIFFERENTIAL/PLATELET - Abnormal; Notable for the following:    HCT 35.6 (*)    All other components within normal limits  BASIC METABOLIC PANEL - Abnormal; Notable for the following:    Calcium 8.5 (*)    All other components within normal limits  TROPONIN I    Imaging Review No results found. I have personally reviewed and evaluated these images and lab results as part of my medical decision-making.   EKG Interpretation None      Date: 10/19/2015  Rate: 48  Rhythm: normal sinus rhythm  QRS Axis: normal  Intervals: normal  ST/T Wave abnormalities: normal  Conduction Disutrbances: none  Narrative Interpretation: unremarkable    MDM   Final diagnoses:  Syncope, unspecified syncope type  Bradycardia    Patient is alert in the emergency department. Family reports she is back to normal. Pulse has gone to the low 40s. She is uncertain what medications she is taking. I'm concerned that she may be on a beta blocker. She will get primary care follow-up this week. I have asked her and her family to take all her medications to the doctor's office.    Nat Christen, MD 10/20/15 2042

## 2015-10-20 ENCOUNTER — Telehealth: Payer: Self-pay | Admitting: Family

## 2015-10-20 NOTE — Telephone Encounter (Signed)
Appt given for tomorrow for hospital follow up per daughter

## 2015-10-21 ENCOUNTER — Ambulatory Visit (INDEPENDENT_AMBULATORY_CARE_PROVIDER_SITE_OTHER): Payer: Medicare Other | Admitting: Family

## 2015-10-21 ENCOUNTER — Encounter: Payer: Self-pay | Admitting: Family

## 2015-10-21 VITALS — BP 145/82 | HR 53 | Temp 97.2°F | Ht 61.0 in | Wt 131.8 lb

## 2015-10-21 DIAGNOSIS — I10 Essential (primary) hypertension: Secondary | ICD-10-CM | POA: Diagnosis not present

## 2015-10-21 DIAGNOSIS — R001 Bradycardia, unspecified: Secondary | ICD-10-CM

## 2015-10-21 DIAGNOSIS — R55 Syncope and collapse: Secondary | ICD-10-CM

## 2015-10-21 DIAGNOSIS — Z09 Encounter for follow-up examination after completed treatment for conditions other than malignant neoplasm: Secondary | ICD-10-CM | POA: Diagnosis not present

## 2015-10-21 MED ORDER — LISINOPRIL 10 MG PO TABS: 10.0000 mg | ORAL_TABLET | Freq: Every day | ORAL | Status: DC

## 2015-10-21 NOTE — Progress Notes (Signed)
   Subjective:    Patient ID: Alexandria Chen, female    DOB: 1935/03/12, 81 y.o.   MRN: [id number]  HPI PT presents to the office today for hospital follow up. PT went to the ED on 06/18 for syncope episode and lost consciousness for 5 mins. All of the patient's lab work negative and EKG. PT was bradycardic in the ED, but pt could not remember what medications she was on. PT was told to follow up with PCP. Pt denies any palpitations, SOB, or edema at this time. PT is taking atenolol 25 mg daily and felodipine 10 mg.     Review of Systems  Constitutional: Negative.   HENT: Negative.   Eyes: Negative.   Respiratory: Negative.  Negative for shortness of breath.   Cardiovascular: Negative.  Negative for palpitations.  Gastrointestinal: Negative.   Endocrine: Negative.   Genitourinary: Negative.   Musculoskeletal: Negative.   Neurological: Negative.  Negative for headaches.  Hematological: Negative.   Psychiatric/Behavioral: Negative.   All other systems reviewed and are negative.      Objective:   Physical Exam  Constitutional: She is oriented to person, place, and time. She appears well-developed and well-nourished. No distress.  HENT:  Head: Normocephalic.  Eyes: Pupils are equal, round, and reactive to light.  Neck: Normal range of motion. Neck supple. No thyromegaly present.  Cardiovascular: Normal rate, regular rhythm, normal heart sounds and intact distal pulses.   No murmur heard. Pulmonary/Chest: Effort normal. No respiratory distress. She has wheezes.  Diminished coarse breath sounds   Abdominal: Soft. Bowel sounds are normal. She exhibits no distension. There is no tenderness.  Musculoskeletal: Normal range of motion. She exhibits no edema or tenderness.  Neurological: She is alert and oriented to person, place, and time.  Skin: Skin is warm and dry.  Psychiatric: She has a normal mood and affect. Her behavior is normal. Judgment and thought content normal.  Vitals  reviewed.     BP 145/82 mmHg  Pulse 53  Temp(Src) 97.2 F (36.2 C) (Oral)  Ht '5\' 1"'$  (1.549 m)  Wt 131 lb 12.8 oz (59.784 kg)  BMI 24.92 kg/m2     Assessment & Plan:  1. Syncope and collapse  2. Hospital discharge follow-up  3. Bradycardia  4. Essential hypertension, benign   PT's atenolol 25 mg stopped today Lisinopril 10 mg added today Falls precautions discussed Smoking cessation discussed Medical Alert services discussed and pamphlet given to family RTO in 2 weeks  Evelina Dun, FNP

## 2015-10-21 NOTE — Patient Instructions (Signed)
Bradycardia  Bradycardia is a slower-than-normal heart rate. A normal resting heart rate for an adult ranges from 60 to 100 beats per minute. With bradycardia, the resting heart rate is less than 60 beats per minute.  Bradycardia is a problem if your heart cannot pump enough oxygen-rich blood through your body. Bradycardia is not a problem for everyone. For some healthy adults, a slow resting heart rate is normal.   CAUSES   Bradycardia may be caused by:  · A problem with the heart's electrical system, such as heart block.  · A problem with the heart's natural pacemaker (sinus node).  · Heart disease, damage, or infection.  · Certain medicines that treat heart conditions.  · Certain conditions, such as hypothyroidism and obstructive sleep apnea.  RISK FACTORS   Risk factors include:  · Being 65 or older.  · Having high blood pressure (hypertension), high cholesterol (hyperlipidemia), or diabetes.  · Drinking heavily, using tobacco products, or using drugs.  · Being stressed.  SIGNS AND SYMPTOMS   Signs and symptoms include:  · Light-headedness.  · Fainting or near fainting.  · Fatigue and weakness.  · Shortness of breath.  · Chest pain (angina).  · Drowsiness.  · Confusion.  · Dizziness.  DIAGNOSIS   Diagnosis of bradycardia may include:  · A physical exam.  · An electrocardiogram (ECG).  · Blood tests.  TREATMENT   Treatment for bradycardia may include:  · Treatment of an underlying condition.  · Pacemaker placement. A pacemaker is a small, battery-powered device that is placed under the skin and is programmed to sense your heartbeats. If your heart rate is lower than the programmed rate, the pacemaker will pace your heart.  · Changing your medicines or dosages.  HOME CARE INSTRUCTIONS  · Take medicines only as directed by your health care provider.  · Manage any health conditions that contribute to bradycardia as directed by your health care provider.  · Follow a heart-healthy diet. A dietitian can help educate  you on healthy food options and changes.  · Follow an exercise program approved by your health care provider.  · Maintain a healthy weight. Lose weight as approved by your health care provider.  · Do not use tobacco products, including cigarettes, chewing tobacco, or electronic cigarettes. If you need help quitting, ask your health care provider.  · Do not use illegal drugs.  · Limit alcohol intake to no more than 1 drink per day for nonpregnant women and 2 drinks per day for men. One drink equals 12 ounces of beer, 5 ounces of wine, or 1½ ounces of hard liquor.  · Keep all follow-up visits as directed by your health care provider. This is important.  SEEK MEDICAL CARE IF:  · You feel light-headed or dizzy.  · You almost faint.  · You feel weak or are easily fatigued during physical activity.  · You experience confusion or have memory problems.  SEEK IMMEDIATE MEDICAL CARE IF:   · You faint.  · You have an irregular heartbeat.  · You have chest pain.  · You have trouble breathing.  MAKE SURE YOU:   · Understand these instructions.  · Will watch your condition.  · Will get help right away if you are not doing well or get worse.     This information is not intended to replace advice given to you by your health care provider. Make sure you discuss any questions you have with your health care provider.       Document Released: 01/09/2002 Document Revised: 05/10/2014 Document Reviewed: 07/25/2013  Elsevier Interactive Patient Education ©2016 [employer]

## 2015-10-24 ENCOUNTER — Other Ambulatory Visit: Payer: Self-pay | Admitting: Family

## 2015-10-24 ENCOUNTER — Telehealth (HOSPITAL_COMMUNITY): Payer: Self-pay | Admitting: *Deleted

## 2015-10-24 ENCOUNTER — Telehealth: Payer: Self-pay

## 2015-10-24 NOTE — Telephone Encounter (Signed)
Daughter called and is unsure of dosage of Felodipine.  It is prescribed as one pill daily but her mother said she has been taking 1/2 pill daily for about the last month.  Should be taking one pill or 1/2 pill daily?  Please call her daughter, Alexandria Chen, back at [phone].

## 2015-10-24 NOTE — Telephone Encounter (Signed)
phone call from patient's daughter, [name].  Patient has been passing out, about five times this year, was taken to [facility].  [name] is asking for a phone call to discuss patient's care, and medications.

## 2015-10-24 NOTE — Telephone Encounter (Signed)
Pt aware.

## 2015-10-24 NOTE — Telephone Encounter (Signed)
If pt has been taking 1/2 pill for the last month then she needs to continue half a tab. PT's HR was low last visit and we do not want to decrease it any more than it is.

## 2015-10-24 NOTE — Telephone Encounter (Signed)
Called pt number on file due to previous phone call. Per pt, her daughter wants to talk to office about one fo her medications. Per pt, she believe it's about her Plendil 10 mg. Per pt she's not sure of the details but could office call her to talk to her. Informed pt that office do not have release to speak with pt and if she was giving office verbal consent to speak with her Daughter Pavielle Biggar. Per pt, she give office verbal consent and her daughter number is [phone].

## 2015-10-29 NOTE — Telephone Encounter (Signed)
Called pt daughter Darlina Mccaughey due to verbal consent to talk with her per pt. Per pt daughter, pt have been having passing out issues. Per daughter they took pt to doctor for that. Per pt daughter, she was looking at pt medications and noticed that pt was on Gabapentin and did not know why she is taking that medication and noticed that Dr. Harrington Challenger put her on it. Informed pt daughter that she and pt would have to come into office sooner to discuss this with provider and pt daughter agreed. Pt and her daughter are both scheduled to come into office 10-30-15

## 2015-10-30 ENCOUNTER — Ambulatory Visit (INDEPENDENT_AMBULATORY_CARE_PROVIDER_SITE_OTHER): Payer: Medicare Other | Admitting: Psychiatry

## 2015-10-30 ENCOUNTER — Encounter (HOSPITAL_COMMUNITY): Payer: Self-pay | Admitting: Psychiatry

## 2015-10-30 VITALS — BP 111/71 | HR 70 | Ht 61.0 in | Wt 128.8 lb

## 2015-10-30 DIAGNOSIS — F331 Major depressive disorder, recurrent, moderate: Secondary | ICD-10-CM

## 2015-10-30 MED ORDER — CLONAZEPAM 0.5 MG PO TABS: 0.5000 mg | ORAL_TABLET | Freq: Two times a day (BID) | ORAL | Status: DC | PRN

## 2015-10-30 NOTE — Progress Notes (Signed)
Patient ID: CHARDONAY SCRITCHFIELD, female   DOB: 06-06-1934, 81 y.o.   MRN: [id number] Patient ID: KIANA HOLLAR, female   DOB: [birth year], 81 y.o.   MRN: [id number] Patient ID: VICI NOVICK, female   DOB: [birth year], 81 y.o.   MRN: [id number] Patient ID: DASHLEY MONTS, female   DOB: [birth year], 81 y.o.   MRN: [id number] Patient ID: JERMANY SUNDELL, female   DOB: Mar 04, 1935, 81 y.o.   MRN: [id number] Patient ID: SENOVIA GAUER, female   DOB: [birth year], 81 y.o.   MRN: [id number] Patient ID: AUDIE WIESER, female   DOB: [birth year], 81 y.o.   MRN: [id number] Patient ID: MANVIR THORSON, female   DOB: [birth year], 81 y.o.   MRN: [id number] Patient ID: JAIRA CANADY, female   DOB: [birth year], 81 y.o.   MRN: [id number] Patient ID: [name], female   DOB: 02-28-1935, 81 y.o.   MRN: [id number] Patient ID: TANEESHA EDGIN, female   DOB: [birth year], 81 y.o.   MRN: [id number] Patient ID: MARLYN TONDREAU, female   DOB: 1935/03/08, 81 y.o.   MRN: [id number] [facility] [id number] Progress Note BRAD MCGAUGHY MRN: [id number] DOB: Dec 25, 1934 Age: 81 y.o.  Date: 10/30/2015 Start Time: 10:40 AM End Time: 10:59 AM  Chief Complaint: Chief Complaint  Patient presents with  . Depression  . Anxiety  . Follow-up   Subjective:  "I've been jittery"  This patient is a 81 year old widowed black female who lives with her 11 year old grandson in [place]. She is a retired Engineer, materials.  The patient has had a long history of depression anxiety. She's always been an anxious person and thinks she inherited this trait from her mother. She used to abuse alcohol but had stopped for several years until a relapse last year. She was hospitalized in 2013 for alcohol relapse and has not used since. Her 31 year old mother lives across the street and calls her constantly and this makes her nervous. She does state that the addition of Neurontin and BuSpar helped considerably with her anxiety. In general her mood is good, her energy is good and she is sleeping well.  She denies any suicidal ideation  Patient returns after 4 weeks. Her daughter is here with her today. The patient had another falling out spell in church and was seen in the ER and also by her primary provider since that. She goes out suddenly and stays out for about 5 minutes and then reawakens. She had an MRI last year which was normal and her laboratories are good. She has these lightheaded spells but now her atenolol has been stopped and over the past week she's been feeling better. I told the daughter perhaps we could cut the Neurontin down to once a day. If she gets anxious if given them low-dose clonazepam to try  Current psychiatric medication BuSpar 15 mg 3 times a day Neurontin 100 mg twice a day Prozac 40 mg daily.   BP 111/71 mmHg  Pulse 70  Ht '5\' 1"'  (1.549 m)  Wt 128 lb 12.8 oz (58.423 kg)  BMI 24.35 kg/m2  SpO2 95%  Past psychiatric history Patient was  admitted to behavioral [place] in June 2013 due to relapse into her alcohol.  She has significant history of depression and alcohol abuse.  She has at least 2 other psychiatric admission at behavioral [facility] which are exacerbated by alcohol intake.   Patient had a good response with Prozac and BuSpar.  She was taking  benzodiazepine in the past however due to history of alcohol it was discontinued.  Patient denies any history of paranoia or psychosis.  Allergies: No Known Allergies Medical History: Past Medical History  Diagnosis Date  . HTN (hypertension)   . Depression   . Anxiety   . Alcohol abuse, in remission   Hypertension.  Patient sees Dr. Ronnald Collum in [place].    Surgical History: No past surgical history on file. Family History: family history includes Alcohol abuse in her brother and father; Anxiety disorder in her mother; Dementia in her mother. There is no history of ADD / ADHD, Drug abuse, Bipolar disorder, Depression, OCD, Paranoid behavior, Schizophrenia, Seizures, Sexual  abuse, or Physical abuse. Reviewed and no changes noted today.  Psychosocial history Patient lives by herself however his grandson stays sometime with her.  Patient has 4 daughter and one son.  2 of her daughter lives close by.      Alcohol and Substance use history The patient has significant history of drinking alcohol for more than 30 years.  She  has been admitted few times due to relapse into drinking and worsening of depression .  Patient claims to be sober since she released from the hospital.    Mental status examination.  Patient is casually dressed.  She is pleasant cooperative and maintained good eye contact.  Her attention and concentration is  good.  Her speech is clear and coherent.  Her thought process is also logical and goal-directed.  She described her mood asanxious and her affect is mood appropriate.  She denies any active or passive suicidal thoughts or homicidal thoughts.  There were no paranoia or delusion present at this time.  There were no tremors although her hands are shaky.  Her attention concentration is good.  She's alert oriented x3.  There were no flight of idea or loose association.  Her insight judgment and pulse control isgood. Fund of knowledge is fair speech is clear and coherent and memory is fair as well  Lab Results:  Results for orders placed or performed during the hospital encounter of 10/19/15 (from the past 8736 hour(s))  CBC with Differential   Collection Time: 10/19/15  2:10 PM  Result Value Ref Range   WBC 6.0 4.0 - 10.5 K/uL   RBC 4.02 3.87 - 5.11 MIL/uL   Hemoglobin 12.1 12.0 - 15.0 g/dL   HCT 35.6 (L) 36.0 - 46.0 %   MCV 88.6 78.0 - 100.0 fL   MCH 30.1 26.0 - 34.0 pg   MCHC 34.0 30.0 - 36.0 g/dL   RDW 14.4 11.5 - 15.5 %   Platelets 281 150 - 400 K/uL   Neutrophils Relative % 62 %   Neutro Abs 3.7 1.7 - 7.7 K/uL   Lymphocytes Relative 29 %   Lymphs Abs 1.8 0.7 - 4.0 K/uL   Monocytes Relative 8 %   Monocytes Absolute 0.5 0.1 - 1.0 K/uL    Eosinophils Relative 1 %   Eosinophils Absolute 0.1 0.0 - 0.7 K/uL   Basophils Relative 0 %   Basophils Absolute 0.0 0.0 - 0.1 K/uL  Basic metabolic panel   Collection Time: 10/19/15  2:10 PM  Result Value Ref Range   Sodium 135 135 - 145 mmol/L   Potassium 3.9 3.5 - 5.1 mmol/L   Chloride 106 101 - 111 mmol/L   CO2 24 22 - 32 mmol/L   Glucose, Bld 88 65 - 99 mg/dL   BUN 15 6 - 20  mg/dL   Creatinine, Ser 0.74 0.44 - 1.00 mg/dL   Calcium 8.5 (L) 8.9 - 10.3 mg/dL   GFR calc non Af Amer >60 >60 mL/min   GFR calc Af Amer >60 >60 mL/min   Anion gap 5 5 - 15  Troponin I   Collection Time: 10/19/15  2:10 PM  Result Value Ref Range   Troponin I <0.03 <0.031 ng/mL  Results for orders placed or performed during the hospital encounter of 09/04/15 (from the past 8736 hour(s))  Troponin I   Collection Time: 09/04/15  5:00 PM  Result Value Ref Range   Troponin I <0.03 <0.031 ng/mL  Basic metabolic panel   Collection Time: 09/04/15  5:00 PM  Result Value Ref Range   Sodium 137 135 - 145 mmol/L   Potassium 3.8 3.5 - 5.1 mmol/L   Chloride 103 101 - 111 mmol/L   CO2 27 22 - 32 mmol/L   Glucose, Bld 100 (H) 65 - 99 mg/dL   BUN 17 6 - 20 mg/dL   Creatinine, Ser 0.89 0.44 - 1.00 mg/dL   Calcium 9.5 8.9 - 10.3 mg/dL   GFR calc non Af Amer >60 >60 mL/min   GFR calc Af Amer >60 >60 mL/min   Anion gap 7 5 - 15  CBC with Differential   Collection Time: 09/04/15  5:00 PM  Result Value Ref Range   WBC 8.5 4.0 - 10.5 K/uL   RBC 4.55 3.87 - 5.11 MIL/uL   Hemoglobin 13.5 12.0 - 15.0 g/dL   HCT 40.7 36.0 - 46.0 %   MCV 89.5 78.0 - 100.0 fL   MCH 29.7 26.0 - 34.0 pg   MCHC 33.2 30.0 - 36.0 g/dL   RDW 14.3 11.5 - 15.5 %   Platelets 283 150 - 400 K/uL   Neutrophils Relative % 73 %   Neutro Abs 6.3 1.7 - 7.7 K/uL   Lymphocytes Relative 19 %   Lymphs Abs 1.6 0.7 - 4.0 K/uL   Monocytes Relative 7 %   Monocytes Absolute 0.6 0.1 - 1.0 K/uL   Eosinophils Relative 1 %   Eosinophils Absolute 0.1  0.0 - 0.7 K/uL   Basophils Relative 0 %   Basophils Absolute 0.0 0.0 - 0.1 K/uL  Results for orders placed or performed in visit on 04/14/15 (from the past 8736 hour(s))  CBC with Differential/Platelet   Collection Time: 04/14/15  5:19 PM  Result Value Ref Range   WBC 7.2 3.4 - 10.8 x10E3/uL   RBC 4.34 3.77 - 5.28 x10E6/uL   Hemoglobin 12.7 11.1 - 15.9 g/dL   Hematocrit 37.6 34.0 - 46.6 %   MCV 87 79 - 97 fL   MCH 29.3 26.6 - 33.0 pg   MCHC 33.8 31.5 - 35.7 g/dL   RDW 14.3 12.3 - 15.4 %   Platelets 476 (H) 150 - 379 x10E3/uL   Neutrophils 39 %   Lymphs 46 %   Monocytes 12 %   Eos 3 %   Basos 0 %   Neutrophils Absolute 2.8 1.4 - 7.0 x10E3/uL   Lymphocytes Absolute 3.3 (H) 0.7 - 3.1 x10E3/uL   Monocytes Absolute 0.8 0.1 - 0.9 x10E3/uL   EOS (ABSOLUTE) 0.2 0.0 - 0.4 x10E3/uL   Basophils Absolute 0.0 0.0 - 0.2 x10E3/uL   Immature Granulocytes 0 %   Immature Grans (Abs) 0.0 0.0 - 0.1 x10E3/uL  CMP14+EGFR   Collection Time: 04/14/15  5:19 PM  Result Value Ref Range   Glucose 92 65 -  99 mg/dL   BUN 11 8 - 27 mg/dL   Creatinine, Ser 0.68 0.57 - 1.00 mg/dL   GFR calc non Af Amer 84 >59 mL/min/1.73   GFR calc Af Amer 97 >59 mL/min/1.73   BUN/Creatinine Ratio 16 11 - 26   Sodium 143 134 - 144 mmol/L   Potassium 3.5 3.5 - 5.2 mmol/L   Chloride 101 96 - 106 mmol/L   CO2 24 18 - 29 mmol/L   Calcium 9.6 8.7 - 10.3 mg/dL   Total Protein 7.3 6.0 - 8.5 g/dL   Albumin 3.9 3.5 - 4.8 g/dL   Globulin, Total 3.4 1.5 - 4.5 g/dL   Albumin/Globulin Ratio 1.1 1.1 - 2.5   Bilirubin Total 0.4 0.0 - 1.2 mg/dL   Alkaline Phosphatase 67 39 - 117 IU/L   AST 13 0 - 40 IU/L   ALT 10 0 - 32 IU/L  Results for orders placed or performed in visit on 12/18/14 (from the past 8736 hour(s))  BMP8+EGFR   Collection Time: 12/18/14  9:11 AM  Result Value Ref Range   Glucose 102 (H) 65 - 99 mg/dL   BUN 11 8 - 27 mg/dL   Creatinine, Ser 0.70 0.57 - 1.00 mg/dL   GFR calc non Af Amer 83 >59 mL/min/1.73   GFR  calc Af Amer 96 >59 mL/min/1.73   BUN/Creatinine Ratio 16 11 - 26   Sodium 142 134 - 144 mmol/L   Potassium 3.9 3.5 - 5.2 mmol/L   Chloride 102 97 - 108 mmol/L   CO2 24 18 - 29 mmol/L   Calcium 9.3 8.7 - 10.3 mg/dL  Lipid panel   Collection Time: 12/18/14  9:11 AM  Result Value Ref Range   Cholesterol, Total 168 100 - 199 mg/dL   Triglycerides 59 0 - 149 mg/dL   HDL 40 >39 mg/dL   VLDL Cholesterol Cal 12 5 - 40 mg/dL   LDL Calculated 116 (H) 0 - 99 mg/dL   Chol/HDL Ratio 4.2 0.0 - 4.4 ratio units  PCP draws routine labs and nothing is emerging as of concern.  Assessment.  Axis I Maj.  Depressive disorder , alcohol abuse.  anxiety disorder NOS  Axis II deferred Axis III hypertension  Axis IV moderate Axis V 60-65  Plan/Discussion: I took her vitals.  I reviewed CC, tobacco/med/surg Hx, meds effects/ side effects, problem list, therapies and responses as well as current situation/symptoms discussed options. He'll continue Prozac for depression and Neurontin for anxiety decrease Neurontin to 100 mg daily. She will continue BuSpar 15 mg to 3 times a day for anxiety.Clonazepam 0.5 mg up to 2 a day has been ordered for anxiety She'll return in 6 weeks but if her depression worsens she will call us See orders and pt instructions for more details.  MEDICATIONS this encounter: Meds ordered this encounter  Medications  . clonazePAM (KLONOPIN) 0.5 MG tablet    Sig: Take 1 tablet (0.5 mg total) by mouth 2 (two) times daily as needed for anxiety.    Dispense:  60 tablet    Refill:  3   Medical Decision Making Problem Points:  Established problem, stable/improving (1), Review of last therapy session (1) and Review of psycho-social stressors (1) Data Points:  Review or order clinical lab tests (1) Review of medication regiment & side effects (2)  I certify that outpatient services furnished can reasonably be expected to improve the patient's condition.   Levonne Spiller, MD

## 2015-11-05 ENCOUNTER — Ambulatory Visit (INDEPENDENT_AMBULATORY_CARE_PROVIDER_SITE_OTHER): Payer: Medicare Other | Admitting: Family

## 2015-11-05 ENCOUNTER — Encounter: Payer: Self-pay | Admitting: Family

## 2015-11-05 VITALS — BP 114/74 | HR 76 | Temp 97.2°F | Ht 61.0 in | Wt 129.6 lb

## 2015-11-05 DIAGNOSIS — Z780 Asymptomatic menopausal state: Secondary | ICD-10-CM | POA: Diagnosis not present

## 2015-11-05 DIAGNOSIS — R0981 Nasal congestion: Secondary | ICD-10-CM | POA: Diagnosis not present

## 2015-11-05 DIAGNOSIS — R55 Syncope and collapse: Secondary | ICD-10-CM

## 2015-11-05 DIAGNOSIS — I10 Essential (primary) hypertension: Secondary | ICD-10-CM | POA: Diagnosis not present

## 2015-11-05 DIAGNOSIS — F419 Anxiety disorder, unspecified: Secondary | ICD-10-CM | POA: Diagnosis not present

## 2015-11-05 MED ORDER — FLUTICASONE PROPIONATE 50 MCG/ACT NA SUSP: 2.0000 | Freq: Two times a day (BID) | NASAL | Status: AC

## 2015-11-05 NOTE — Progress Notes (Signed)
   Subjective:    Patient ID: Alexandria Chen, female    DOB: 1935/03/25, 81 y.o.   MRN: [id number]  HPI PT presents to the office today to recheck HTN. Pt was seen in the office on 10/21/15 for syncope and bradycardia and her atenolol 25 mg was stopped and her lisinopril 10 mg was added. Pt states she does not feel dizzy, light headed, or weak any more. Pt states she is feeling better, but continues to feel anxious. Pt see's behavioral health every 3 months.    Review of Systems  Respiratory: Negative for choking, shortness of breath and wheezing.   Cardiovascular: Negative for chest pain, palpitations and leg swelling.  Psychiatric/Behavioral: The patient is nervous/anxious.   All other systems reviewed and are negative.      Objective:   Physical Exam  Constitutional: She is oriented to person, place, and time. She appears well-developed and well-nourished. No distress.  HENT:  Head: Normocephalic and atraumatic.  Nasal passage erythemas with mild swelling    Eyes: Pupils are equal, round, and reactive to light.  Neck: Normal range of motion. Neck supple. No thyromegaly present.  Cardiovascular: Normal rate, regular rhythm, normal heart sounds and intact distal pulses.   No murmur heard. Pulmonary/Chest: Effort normal and breath sounds normal. No respiratory distress. She has no wheezes.  Abdominal: Soft. Bowel sounds are normal. She exhibits no distension. There is no tenderness.  Musculoskeletal: Normal range of motion. She exhibits no edema or tenderness.  Neurological: She is alert and oriented to person, place, and time.  Skin: Skin is warm and dry.  Psychiatric: She has a normal mood and affect. Her behavior is normal. Judgment and thought content normal.  Vitals reviewed.     BP 114/74 mmHg  Pulse 76  Temp(Src) 97.2 F (36.2 C) (Oral)  Ht '5\' 1"'$  (1.549 m)  Wt 129 lb 9.6 oz (58.786 kg)  BMI 24.50 kg/m2     Assessment & Plan:  1. Essential hypertension,  benign -Continue medications  2. Syncope and collapse -Resolved since stopping Altenol   3. Anxiety -Keep appts with behavioral health   4. Post-menopause - DG [facility] DEXA   Continue all meds Labs pending Health Maintenance reviewed Diet and exercise encouraged RTO 3 months  Evelina Dun, FNP

## 2015-11-05 NOTE — Patient Instructions (Signed)
Health Maintenance, Female Adopting a healthy lifestyle and getting preventive care can go a long way to promote health and wellness. Talk with your health care provider about what schedule of regular examinations is right for you. This is a good chance for you to check in with your provider about disease prevention and staying healthy. In between checkups, there are plenty of things you can do on your own. Experts have done a lot of research about which lifestyle changes and preventive measures are most likely to keep you healthy. Ask your health care provider for more information. WEIGHT AND DIET  Eat a healthy diet  Be sure to include plenty of vegetables, fruits, low-fat dairy products, and lean protein.  Do not eat a lot of foods high in solid fats, added sugars, or salt.  Get regular exercise. This is one of the most important things you can do for your health.  Most adults should exercise for at least 150 minutes each week. The exercise should increase your heart rate and make you sweat (moderate-intensity exercise).  Most adults should also do strengthening exercises at least twice a week. This is in addition to the moderate-intensity exercise.  Maintain a healthy weight  Body mass index (BMI) is a measurement that can be used to identify possible weight problems. It estimates body fat based on height and weight. Your health care provider can help determine your BMI and help you achieve or maintain a healthy weight.  For females 20 years of age and older:   A BMI below 18.5 is considered underweight.  A BMI of 18.5 to 24.9 is normal.  A BMI of 25 to 29.9 is considered overweight.  A BMI of 30 and above is considered obese.  Watch levels of cholesterol and blood lipids  You should start having your blood tested for lipids and cholesterol at 79 years of age, then have this test every 5 years.  You may need to have your cholesterol levels checked more often if:  Your lipid  or cholesterol levels are high.  You are older than 79 years of age.  You are at high risk for heart disease.  CANCER SCREENING   Lung Cancer  Lung cancer screening is recommended for adults 55-80 years old who are at high risk for lung cancer because of a history of smoking.  A yearly low-dose CT scan of the lungs is recommended for people who:  Currently smoke.  Have quit within the past 15 years.  Have at least a 30-pack-year history of smoking. A pack year is smoking an average of one pack of cigarettes a day for 1 year.  Yearly screening should continue until it has been 15 years since you quit.  Yearly screening should stop if you develop a health problem that would prevent you from having lung cancer treatment.  Breast Cancer  Practice breast self-awareness. This means understanding how your breasts normally appear and feel.  It also means doing regular breast self-exams. Let your health care provider know about any changes, no matter how small.  If you are in your 20s or 30s, you should have a clinical breast exam (CBE) by a health care provider every 1-3 years as part of a regular health exam.  If you are 40 or older, have a CBE every year. Also consider having a breast X-ray (mammogram) every year.  If you have a family history of breast cancer, talk to your health care provider about genetic screening.  If you   are at high risk for breast cancer, talk to your health care provider about having an MRI and a mammogram every year.  Breast cancer gene (BRCA) assessment is recommended for women who have family members with BRCA-related cancers. BRCA-related cancers include:  Breast.  Ovarian.  Tubal.  Peritoneal cancers.  Results of the assessment will determine the need for genetic counseling and BRCA1 and BRCA2 testing. Cervical Cancer Your health care provider may recommend that you be screened regularly for cancer of the pelvic organs (ovaries, uterus, and  vagina). This screening involves a pelvic examination, including checking for microscopic changes to the surface of your cervix (Pap test). You may be encouraged to have this screening done every 3 years, beginning at age 21.  For women ages 30-65, health care providers may recommend pelvic exams and Pap testing every 3 years, or they may recommend the Pap and pelvic exam, combined with testing for human papilloma virus (HPV), every 5 years. Some types of HPV increase your risk of cervical cancer. Testing for HPV may also be done on women of any age with unclear Pap test results.  Other health care providers may not recommend any screening for nonpregnant women who are considered low risk for pelvic cancer and who do not have symptoms. Ask your health care provider if a screening pelvic exam is right for you.  If you have had past treatment for cervical cancer or a condition that could lead to cancer, you need Pap tests and screening for cancer for at least 20 years after your treatment. If Pap tests have been discontinued, your risk factors (such as having a new sexual partner) need to be reassessed to determine if screening should resume. Some women have medical problems that increase the chance of getting cervical cancer. In these cases, your health care provider may recommend more frequent screening and Pap tests. Colorectal Cancer  This type of cancer can be detected and often prevented.  Routine colorectal cancer screening usually begins at 79 years of age and continues through 79 years of age.  Your health care provider may recommend screening at an earlier age if you have risk factors for colon cancer.  Your health care provider may also recommend using home test kits to check for hidden blood in the stool.  A small camera at the end of a tube can be used to examine your colon directly (sigmoidoscopy or colonoscopy). This is done to check for the earliest forms of colorectal  cancer.  Routine screening usually begins at age 50.  Direct examination of the colon should be repeated every 5-10 years through 79 years of age. However, you may need to be screened more often if early forms of precancerous polyps or small growths are found. Skin Cancer  Check your skin from head to toe regularly.  Tell your health care provider about any new moles or changes in moles, especially if there is a change in a mole's shape or color.  Also tell your health care provider if you have a mole that is larger than the size of a pencil eraser.  Always use sunscreen. Apply sunscreen liberally and repeatedly throughout the day.  Protect yourself by wearing long sleeves, pants, a wide-brimmed hat, and sunglasses whenever you are outside. HEART DISEASE, DIABETES, AND HIGH BLOOD PRESSURE   High blood pressure causes heart disease and increases the risk of stroke. High blood pressure is more likely to develop in:  People who have blood pressure in the high end   of the normal range (130-139/85-89 mm Hg).  People who are overweight or obese.  People who are African American.  If you are 38-23 years of age, have your blood pressure checked every 3-5 years. If you are 61 years of age or older, have your blood pressure checked every year. You should have your blood pressure measured twice--once when you are at a hospital or clinic, and once when you are not at a hospital or clinic. Record the average of the two measurements. To check your blood pressure when you are not at a hospital or clinic, you can use:  An automated blood pressure machine at a pharmacy.  A home blood pressure monitor.  If you are between 45 years and 39 years old, ask your health care provider if you should take aspirin to prevent strokes.  Have regular diabetes screenings. This involves taking a blood sample to check your fasting blood sugar level.  If you are at a normal weight and have a low risk for diabetes,  have this test once every three years after 79 years of age.  If you are overweight and have a high risk for diabetes, consider being tested at a younger age or more often. PREVENTING INFECTION  Hepatitis B  If you have a higher risk for hepatitis B, you should be screened for this virus. You are considered at high risk for hepatitis B if:  You were born in a country where hepatitis B is common. Ask your health care provider which countries are considered high risk.  Your parents were born in a high-risk country, and you have not been immunized against hepatitis B (hepatitis B vaccine).  You have HIV or AIDS.  You use needles to inject street drugs.  You live with someone who has hepatitis B.  You have had sex with someone who has hepatitis B.  You get hemodialysis treatment.  You take certain medicines for conditions, including cancer, organ transplantation, and autoimmune conditions. Hepatitis C  Blood testing is recommended for:  Everyone born from [birth year] through 1963.  Anyone with known risk factors for hepatitis C. Sexually transmitted infections (STIs)  You should be screened for sexually transmitted infections (STIs) including gonorrhea and chlamydia if:  You are sexually active and are younger than 79 years of age.  You are older than 79 years of age and your health care provider tells you that you are at risk for this type of infection.  Your sexual activity has changed since you were last screened and you are at an increased risk for chlamydia or gonorrhea. Ask your health care provider if you are at risk.  If you do not have HIV, but are at risk, it may be recommended that you take a prescription medicine daily to prevent HIV infection. This is called pre-exposure prophylaxis (PrEP). You are considered at risk if:  You are sexually active and do not regularly use condoms or know the HIV status of your partner(s).  You take drugs by injection.  You are sexually  active with a partner who has HIV. Talk with your health care provider about whether you are at high risk of being infected with HIV. If you choose to begin PrEP, you should first be tested for HIV. You should then be tested every 3 months for as long as you are taking PrEP.  PREGNANCY   If you are premenopausal and you may become pregnant, ask your health care provider about preconception counseling.  If you may  become pregnant, take 400 to 800 micrograms (mcg) of folic acid every day.  If you want to prevent pregnancy, talk to your health care provider about birth control (contraception). OSTEOPOROSIS AND MENOPAUSE   Osteoporosis is a disease in which the bones lose minerals and strength with aging. This can result in serious bone fractures. Your risk for osteoporosis can be identified using a bone density scan.  If you are 61 years of age or older, or if you are at risk for osteoporosis and fractures, ask your health care provider if you should be screened.  Ask your health care provider whether you should take a calcium or vitamin D supplement to lower your risk for osteoporosis.  Menopause may have certain physical symptoms and risks.  Hormone replacement therapy may reduce some of these symptoms and risks. Talk to your health care provider about whether hormone replacement therapy is right for you.  HOME CARE INSTRUCTIONS   Schedule regular health, dental, and eye exams.  Stay current with your immunizations.   Do not use any tobacco products including cigarettes, chewing tobacco, or electronic cigarettes.  If you are pregnant, do not drink alcohol.  If you are breastfeeding, limit how much and how often you drink alcohol.  Limit alcohol intake to no more than 1 drink per day for nonpregnant women. One drink equals 12 ounces of beer, 5 ounces of wine, or 1 ounces of hard liquor.  Do not use street drugs.  Do not share needles.  Ask your health care provider for help if  you need support or information about quitting drugs.  Tell your health care provider if you often feel depressed.  Tell your health care provider if you have ever been abused or do not feel safe at home.   This information is not intended to replace advice given to you by your health care provider. Make sure you discuss any questions you have with your health care provider.   Document Released: 11/02/2010 Document Revised: 05/10/2014 Document Reviewed: 03/21/2013 Elsevier Interactive Patient Education [employer].

## 2015-11-09 ENCOUNTER — Other Ambulatory Visit: Payer: Self-pay | Admitting: Family Medicine

## 2015-12-10 ENCOUNTER — Ambulatory Visit (INDEPENDENT_AMBULATORY_CARE_PROVIDER_SITE_OTHER): Payer: Medicare Other | Admitting: Psychiatry

## 2015-12-10 ENCOUNTER — Encounter (HOSPITAL_COMMUNITY): Payer: Self-pay | Admitting: Psychiatry

## 2015-12-10 VITALS — BP 118/57 | HR 50 | Ht 61.0 in

## 2015-12-10 DIAGNOSIS — F419 Anxiety disorder, unspecified: Secondary | ICD-10-CM

## 2015-12-10 DIAGNOSIS — F331 Major depressive disorder, recurrent, moderate: Secondary | ICD-10-CM

## 2015-12-10 MED ORDER — FLUOXETINE HCL 40 MG PO CAPS: ORAL_CAPSULE | ORAL | 3 refills | Status: DC

## 2015-12-10 MED ORDER — GABAPENTIN 100 MG PO CAPS: 100.0000 mg | ORAL_CAPSULE | Freq: Every day | ORAL | 2 refills | Status: DC

## 2015-12-10 MED ORDER — BUSPIRONE HCL 15 MG PO TABS: 15.0000 mg | ORAL_TABLET | Freq: Two times a day (BID) | ORAL | 2 refills | Status: DC

## 2015-12-10 NOTE — Progress Notes (Signed)
Patient ID: LATANJA LEHENBAUER, female   DOB: 26-Oct-1934, 81 y.o.   MRN: [id number] Patient ID: JENNALEE GREAVES, female   DOB: 1935/04/13, 81 y.o.   MRN: [id number] Patient ID: ARTHA STAVROS, female   DOB: 26-Feb-1935, 81 y.o.   MRN: [id number] Patient ID: RAYETTA VEITH, female   DOB: [birth year], 81 y.o.   MRN: [id number] Patient ID: SHARNIKA BINNEY, female   DOB: Jun 10, 1934, 81 y.o.   MRN: [id number] Patient ID: RAYNI NEMITZ, female   DOB: 1934/09/03, 81 y.o.   MRN: [id number] Patient ID: ADEL NEYER, female   DOB: 01-Jul-1934, 81 y.o.   MRN: [id number] Patient ID: EKATERINI CAPITANO, female   DOB: [birth year], 81 y.o.   MRN: [id number] Patient ID: DEVYNE HAUGER, female   DOB: 06-08-1934, 81 y.o.   MRN: [id number] Patient ID: ADELLA MANOLIS, female   DOB: [birth year], 81 y.o.   MRN: [id number] Patient ID: KATELEN LUEPKE, female   DOB: 1935-01-19, 81 y.o.   MRN: [id number] Patient ID: SHARONA ROVNER, female   DOB: Oct 27, 1934, 81 y.o.   MRN: [id number] [facility] [id number] Progress Note JRUE YAMBAO MRN: [id number] DOB: 1934-10-17 Age: 81 y.o.  Date: 12/10/2015 Start Time: 10:40 AM End Time: 10:59 AM  Chief Complaint: Chief Complaint  Patient presents with  . Depression  . Anxiety  . Follow-up   Subjective:  "I've been ok"  This patient is a 81 year old widowed black female who lives with her 65 year old grandson in [place]. She is a retired Engineer, materials.  The patient has had a long history of depression anxiety. She's always been an anxious person and thinks she inherited this trait from her mother. She used to abuse alcohol but had stopped for several years until a relapse last year. She was hospitalized in 2013 for alcohol relapse and has not used since. Her 38 year old mother lives across the street and calls her constantly and this makes her nervous. She does state that the addition of Neurontin and BuSpar helped considerably with her anxiety. In general her mood is good, her energy is good and she is sleeping well. She  denies any suicidal ideation  Patient returns after 6 weeks. Her daughter is here with her today. The patient has been doing better in general. She's not had any further falling out spells. Her atenolol was stopped but somehow she got more from the pharmacy and started taking some of it again. Her blood pressure and pulse are bit low today and I told her she needed to stop it because she was changed to lisinopril. Her daughter states that the patient doesn't understand when to take her medications and she tries to help her as much as she can with organizing them. She has not tried the clonazepam and I told the daughter to fill it and just keep it with her and if the patient needs that she could ask her for and to use it sparingly. The patient is doing more and seems less depressed and is getting out more with her daughter  Current psychiatric medication BuSpar 15 mg 3 times a day Neurontin 100 mg at bedtime Prozac 40 mg daily.   BP (!) 118/57   Pulse (!) 50   Ht _0  (1.549 m)   SpO2 96%   Past psychiatric history Patient was  admitted to behavioral [facility] in June 2013 due to relapse into her alcohol.  She has significant history of depression and alcohol abuse.  She has at least 2 other psychiatric admission at behavioral [facility] which are exacerbated by alcohol intake.   Patient had a good response with Prozac and BuSpar.  She was taking benzodiazepine in the past however due to history of alcohol it was discontinued.  Patient denies any history of paranoia or psychosis.  Allergies: Not on File Medical History: Past Medical History:  Diagnosis Date  . Alcohol abuse, in remission   . Anxiety   . Depression   . HTN (hypertension)   Hypertension.  Patient sees Dr. Ronnald Collum in [place].    Surgical History: No past surgical history on file. Family History: family history includes Alcohol abuse in her brother and father; Anxiety disorder in her mother;  Dementia in her mother. Reviewed and no changes noted today.  Psychosocial history Patient lives by herself however his grandson stays sometime with her.  Patient has 4 daughter and one son.  2 of her daughter lives close by.      Alcohol and Substance use history The patient has significant history of drinking alcohol for more than 30 years.  She  has been admitted few times due to relapse into drinking and worsening of depression .  Patient claims to be sober since she released from the hospital.    Mental status examination.  Patient is casually dressed.  She is pleasant cooperative and maintained good eye contact.  Her attention and concentration is  good.  Her speech is clear and coherent.  Her thought process is also logical and goal-directed.  She described her mood as good and her affect is mood appropriate.  She denies any active or passive suicidal thoughts or homicidal thoughts.  There were no paranoia or delusion present at this time.  There were no tremors although her hands are shaky.  Her attention concentration is good.  She's alert oriented x3.  There were no flight of idea or loose association.  Her insight judgment and pulse control isgood. Fund of knowledge is fair speech is clear and coherent and memory is fair as well  Lab Results:  Results for orders placed or performed during the hospital encounter of 10/19/15 (from the past 8736 hour(s))  CBC with Differential   Collection Time: 10/19/15  2:10 PM  Result Value Ref Range   WBC 6.0 4.0 - 10.5 K/uL   RBC 4.02 3.87 - 5.11 MIL/uL   Hemoglobin 12.1 12.0 - 15.0 g/dL   HCT 35.6 (L) 36.0 - 46.0 %   MCV 88.6 78.0 - 100.0 fL   MCH 30.1 26.0 - 34.0 pg   MCHC 34.0 30.0 - 36.0 g/dL   RDW 14.4 11.5 - 15.5 %   Platelets 281 150 - 400 K/uL   Neutrophils Relative % 62 %   Neutro Abs 3.7 1.7 - 7.7 K/uL   Lymphocytes Relative 29 %   Lymphs Abs 1.8 0.7 - 4.0 K/uL   Monocytes Relative 8 %   Monocytes Absolute 0.5 0.1 - 1.0 K/uL    Eosinophils Relative 1 %   Eosinophils Absolute 0.1 0.0 - 0.7 K/uL   Basophils Relative 0 %   Basophils Absolute 0.0 0.0 - 0.1 K/uL  Basic metabolic panel   Collection Time: 10/19/15  2:10 PM  Result Value Ref Range   Sodium 135 135 - 145 mmol/L   Potassium 3.9 3.5 - 5.1 mmol/L   Chloride 106 101 - 111 mmol/L   CO2 24 22 - 32 mmol/L   Glucose, Bld 88  65 - 99 mg/dL   BUN 15 6 - 20 mg/dL   Creatinine, Ser 0.74 0.44 - 1.00 mg/dL   Calcium 8.5 (L) 8.9 - 10.3 mg/dL   GFR calc non Af Amer >60 >60 mL/min   GFR calc Af Amer >60 >60 mL/min   Anion gap 5 5 - 15  Troponin I   Collection Time: 10/19/15  2:10 PM  Result Value Ref Range   Troponin I <0.03 <0.031 ng/mL  Results for orders placed or performed during the hospital encounter of 09/04/15 (from the past 8736 hour(s))  Troponin I   Collection Time: 09/04/15  5:00 PM  Result Value Ref Range   Troponin I <0.03 <0.031 ng/mL  Basic metabolic panel   Collection Time: 09/04/15  5:00 PM  Result Value Ref Range   Sodium 137 135 - 145 mmol/L   Potassium 3.8 3.5 - 5.1 mmol/L   Chloride 103 101 - 111 mmol/L   CO2 27 22 - 32 mmol/L   Glucose, Bld 100 (H) 65 - 99 mg/dL   BUN 17 6 - 20 mg/dL   Creatinine, Ser 0.89 0.44 - 1.00 mg/dL   Calcium 9.5 8.9 - 10.3 mg/dL   GFR calc non Af Amer >60 >60 mL/min   GFR calc Af Amer >60 >60 mL/min   Anion gap 7 5 - 15  CBC with Differential   Collection Time: 09/04/15  5:00 PM  Result Value Ref Range   WBC 8.5 4.0 - 10.5 K/uL   RBC 4.55 3.87 - 5.11 MIL/uL   Hemoglobin 13.5 12.0 - 15.0 g/dL   HCT 40.7 36.0 - 46.0 %   MCV 89.5 78.0 - 100.0 fL   MCH 29.7 26.0 - 34.0 pg   MCHC 33.2 30.0 - 36.0 g/dL   RDW 14.3 11.5 - 15.5 %   Platelets 283 150 - 400 K/uL   Neutrophils Relative % 73 %   Neutro Abs 6.3 1.7 - 7.7 K/uL   Lymphocytes Relative 19 %   Lymphs Abs 1.6 0.7 - 4.0 K/uL   Monocytes Relative 7 %   Monocytes Absolute 0.6 0.1 - 1.0 K/uL   Eosinophils Relative 1 %   Eosinophils Absolute 0.1 0.0  - 0.7 K/uL   Basophils Relative 0 %   Basophils Absolute 0.0 0.0 - 0.1 K/uL  Results for orders placed or performed in visit on 04/14/15 (from the past 8736 hour(s))  CBC with Differential/Platelet   Collection Time: 04/14/15  5:19 PM  Result Value Ref Range   WBC 7.2 3.4 - 10.8 x10E3/uL   RBC 4.34 3.77 - 5.28 x10E6/uL   Hemoglobin 12.7 11.1 - 15.9 g/dL   Hematocrit 37.6 34.0 - 46.6 %   MCV 87 79 - 97 fL   MCH 29.3 26.6 - 33.0 pg   MCHC 33.8 31.5 - 35.7 g/dL   RDW 14.3 12.3 - 15.4 %   Platelets 476 (H) 150 - 379 x10E3/uL   Neutrophils 39 %   Lymphs 46 %   Monocytes 12 %   Eos 3 %   Basos 0 %   Neutrophils Absolute 2.8 1.4 - 7.0 x10E3/uL   Lymphocytes Absolute 3.3 (H) 0.7 - 3.1 x10E3/uL   Monocytes Absolute 0.8 0.1 - 0.9 x10E3/uL   EOS (ABSOLUTE) 0.2 0.0 - 0.4 x10E3/uL   Basophils Absolute 0.0 0.0 - 0.2 x10E3/uL   Immature Granulocytes 0 %   Immature Grans (Abs) 0.0 0.0 - 0.1 x10E3/uL  CMP14+EGFR   Collection Time: 04/14/15  5:19  PM  Result Value Ref Range   Glucose 92 65 - 99 mg/dL   BUN 11 8 - 27 mg/dL   Creatinine, Ser 0.68 0.57 - 1.00 mg/dL   GFR calc non Af Amer 84 >59 mL/min/1.73   GFR calc Af Amer 97 >59 mL/min/1.73   BUN/Creatinine Ratio 16 11 - 26   Sodium 143 134 - 144 mmol/L   Potassium 3.5 3.5 - 5.2 mmol/L   Chloride 101 96 - 106 mmol/L   CO2 24 18 - 29 mmol/L   Calcium 9.6 8.7 - 10.3 mg/dL   Total Protein 7.3 6.0 - 8.5 g/dL   Albumin 3.9 3.5 - 4.8 g/dL   Globulin, Total 3.4 1.5 - 4.5 g/dL   Albumin/Globulin Ratio 1.1 1.1 - 2.5   Bilirubin Total 0.4 0.0 - 1.2 mg/dL   Alkaline Phosphatase 67 39 - 117 IU/L   AST 13 0 - 40 IU/L   ALT 10 0 - 32 IU/L  Results for orders placed or performed in visit on 12/18/14 (from the past 8736 hour(s))  BMP8+EGFR   Collection Time: 12/18/14  9:11 AM  Result Value Ref Range   Glucose 102 (H) 65 - 99 mg/dL   BUN 11 8 - 27 mg/dL   Creatinine, Ser 0.70 0.57 - 1.00 mg/dL   GFR calc non Af Amer 83 >59 mL/min/1.73   GFR  calc Af Amer 96 >59 mL/min/1.73   BUN/Creatinine Ratio 16 11 - 26   Sodium 142 134 - 144 mmol/L   Potassium 3.9 3.5 - 5.2 mmol/L   Chloride 102 97 - 108 mmol/L   CO2 24 18 - 29 mmol/L   Calcium 9.3 8.7 - 10.3 mg/dL  Lipid panel   Collection Time: 12/18/14  9:11 AM  Result Value Ref Range   Cholesterol, Total 168 100 - 199 mg/dL   Triglycerides 59 0 - 149 mg/dL   HDL 40 >39 mg/dL   VLDL Cholesterol Cal 12 5 - 40 mg/dL   LDL Calculated 116 (H) 0 - 99 mg/dL   Chol/HDL Ratio 4.2 0.0 - 4.4 ratio units  PCP draws routine labs and nothing is emerging as of concern.  Assessment.  Axis I Maj.  Depressive disorder , alcohol abuse.  anxiety disorder NOS  Axis II deferred Axis III hypertension  Axis IV moderate Axis V 60-65  Plan/Discussion: I took her vitals.  I reviewed CC, tobacco/med/surg Hx, meds effects/ side effects, problem list, therapies and responses as well as current situation/symptoms discussed options. He'll continue Prozac for depression and Neurontin  100 mg daily. She will continue BuSpar 15 mg to 3 times a day for anxiety.Clonazepam 0.5 mg up to 2 a day has been ordered for anxiety She'll return in 3 months but if her depression worsens she will call us See orders and pt instructions for more details.  MEDICATIONS this encounter: Meds ordered this encounter  Medications  . busPIRone (BUSPAR) 15 MG tablet    Sig: Take 1 tablet (15 mg total) by mouth 2 (two) times daily.    Dispense:  180 tablet    Refill:  2  . FLUoxetine (PROZAC) 40 MG capsule    Sig: Take one capsule daily (56m) by mouth for anxiety and depression.    Dispense:  90 capsule    Refill:  3  . gabapentin (NEURONTIN) 100 MG capsule    Sig: Take 1 capsule (100 mg total) by mouth at bedtime.    Dispense:  90 capsule  Refill:  2   Medical Decision Making Problem Points:  Established problem, stable/improving (1), Review of last therapy session (1) and Review of psycho-social stressors (1) Data  Points:  Review or order clinical lab tests (1) Review of medication regiment & side effects (2)  I certify that outpatient services furnished can reasonably be expected to improve the patient's condition.   Levonne Spiller, MD

## 2016-01-02 ENCOUNTER — Ambulatory Visit (HOSPITAL_COMMUNITY): Payer: Self-pay | Admitting: Psychiatry

## 2016-01-12 ENCOUNTER — Other Ambulatory Visit: Payer: Self-pay | Admitting: Family

## 2016-01-14 ENCOUNTER — Other Ambulatory Visit: Payer: Self-pay | Admitting: Family

## 2016-02-05 ENCOUNTER — Ambulatory Visit (INDEPENDENT_AMBULATORY_CARE_PROVIDER_SITE_OTHER): Payer: Medicare Other | Admitting: Family

## 2016-02-05 ENCOUNTER — Ambulatory Visit (INDEPENDENT_AMBULATORY_CARE_PROVIDER_SITE_OTHER): Payer: Medicare Other

## 2016-02-05 ENCOUNTER — Encounter: Payer: Self-pay | Admitting: Family

## 2016-02-05 VITALS — BP 118/73 | HR 64 | Temp 97.5°F | Ht 61.0 in | Wt 127.8 lb

## 2016-02-05 DIAGNOSIS — F419 Anxiety disorder, unspecified: Secondary | ICD-10-CM | POA: Diagnosis not present

## 2016-02-05 DIAGNOSIS — E559 Vitamin D deficiency, unspecified: Secondary | ICD-10-CM | POA: Diagnosis not present

## 2016-02-05 DIAGNOSIS — Z780 Asymptomatic menopausal state: Secondary | ICD-10-CM

## 2016-02-05 DIAGNOSIS — F329 Major depressive disorder, single episode, unspecified: Secondary | ICD-10-CM | POA: Diagnosis not present

## 2016-02-05 DIAGNOSIS — E785 Hyperlipidemia, unspecified: Secondary | ICD-10-CM

## 2016-02-05 DIAGNOSIS — R5383 Other fatigue: Secondary | ICD-10-CM

## 2016-02-05 DIAGNOSIS — Z23 Encounter for immunization: Secondary | ICD-10-CM

## 2016-02-05 DIAGNOSIS — F32A Depression, unspecified: Secondary | ICD-10-CM

## 2016-02-05 DIAGNOSIS — I10 Essential (primary) hypertension: Secondary | ICD-10-CM | POA: Diagnosis not present

## 2016-02-05 NOTE — Progress Notes (Signed)
Subjective:    Patient ID: Alexandria Chen, female    DOB: 22-Apr-1935, 81 y.o.   MRN: [id number]  Pt presents to the office today for chronic follow up. Pt is followed by Psychologists every 3 months for GAD and Depression.  Hypertension  This is a chronic problem. The current episode started more than 1 year ago. The problem has been resolved since onset. The problem is controlled. Associated symptoms include anxiety. Pertinent negatives include no headaches, palpitations, peripheral edema or shortness of breath. Risk factors for coronary artery disease include dyslipidemia, post-menopausal state and sedentary lifestyle. Past treatments include ACE inhibitors. The current treatment provides moderate improvement. There is no history of kidney disease, CAD/MI, CVA or heart failure.  Hyperlipidemia  This is a chronic problem. The current episode started more than 1 year ago. The problem is uncontrolled. Recent lipid tests were reviewed and are high. She has no history of obesity. Pertinent negatives include no shortness of breath. Current antihyperlipidemic treatment includes statins. The current treatment provides moderate improvement of lipids. Risk factors for coronary artery disease include dyslipidemia and post-menopausal.  Anxiety  Presents for follow-up visit. Symptoms include depressed mood, excessive worry, irritability, nervous/anxious behavior and restlessness. Patient reports no palpitations or shortness of breath. Symptoms occur most days.    Depression         This is a chronic problem.  The current episode started more than 1 year ago.   The onset quality is gradual.   The problem occurs intermittently.  Associated symptoms include helplessness, restlessness and sad.  Associated symptoms include no hopelessness and no headaches.  Past treatments include TCAs - Tricyclic antidepressants.  Compliance with treatment is good.  Past medical history includes anxiety.       Review of Systems    Constitutional: Positive for irritability.  Respiratory: Negative for shortness of breath.   Cardiovascular: Negative for palpitations.  Neurological: Negative for headaches.  Psychiatric/Behavioral: Positive for depression. The patient is nervous/anxious.   All other systems reviewed and are negative.      Objective:   Physical Exam  Constitutional: She is oriented to person, place, and time. She appears well-developed and well-nourished. No distress.  HENT:  Head: Normocephalic and atraumatic.  Right Ear: External ear normal.  Left Ear: External ear normal.  Nose: Nose normal.  Mouth/Throat: Oropharynx is clear and moist.  Eyes: Pupils are equal, round, and reactive to light.  Neck: Normal range of motion. Neck supple. No thyromegaly present.  Cardiovascular: Normal rate, regular rhythm, normal heart sounds and intact distal pulses.   No murmur heard. Pulmonary/Chest: Effort normal. No respiratory distress. She has wheezes.  Abdominal: Soft. Bowel sounds are normal. She exhibits no distension. There is no tenderness.  Musculoskeletal: Normal range of motion. She exhibits no edema or tenderness.  Neurological: She is alert and oriented to person, place, and time. She has normal reflexes. No cranial nerve deficit.  Skin: Skin is warm and dry.  Psychiatric: She has a normal mood and affect. Her behavior is normal. Judgment and thought content normal.  Vitals reviewed.    BP 118/73   Pulse 64   Temp 97.5 F (36.4 C) (Oral)   Ht '5\' 1"'  (1.549 m)   Wt 127 lb 12.8 oz (58 kg)   BMI 24.15 kg/m       Assessment & Plan:  1. Essential hypertension, benign -Will stop Felodipine 10 mg today -Dash diet information given -Exercise encouraged - Stress Management  -Continue current meds -  RTO in 2 weeks  - CMP14+EGFR  2. Anxiety - CMP14+EGFR  3. Depression, unspecified depression type - CMP14+EGFR  4. Hyperlipidemia, unspecified hyperlipidemia type - CMP14+EGFR - Lipid  panel  5. Vitamin D deficiency - CMP14+EGFR - VITAMIN D 25 Hydroxy (Vit-D Deficiency, Fractures)  6. Post-menopausal - CMP14+EGFR - DG [facility] DEXA  7. Other fatigue - CMP14+EGFR - VITAMIN D 25 Hydroxy (Vit-D Deficiency, Fractures) - CBC with Differential/Platelet - Thyroid Panel With TSH   Continue all meds Labs pending Health Maintenance reviewed Diet and exercise encouraged RTO 2 weeks to recheck HTN  Evelina Dun, FNP

## 2016-02-05 NOTE — Patient Instructions (Addendum)
Fatigue Fatigue is feeling tired all of the time, a lack of energy, or a lack of motivation. Occasional or mild fatigue is often a normal response to activity or life in general. However, long-lasting (chronic) or extreme fatigue may indicate an underlying medical condition. HOME CARE INSTRUCTIONS  Watch your fatigue for any changes. The following actions may help to lessen any discomfort you are feeling:  Talk to your health care provider about how much sleep you need each night. Try to get the required amount every night.  Take medicines only as directed by your health care provider.  Eat a healthy and nutritious diet. Ask your health care provider if you need help changing your diet.  Drink enough fluid to keep your urine clear or pale yellow.  Practice ways of relaxing, such as yoga, meditation, massage therapy, or acupuncture.  Exercise regularly.   Change situations that cause you stress. Try to keep your work and personal routine reasonable.  Do not abuse illegal drugs.  Limit alcohol intake to no more than 1 drink per day for nonpregnant women and 2 drinks per day for men. One drink equals 12 ounces of beer, 5 ounces of wine, or 1 ounces of hard liquor.  Take a multivitamin, if directed by your health care provider. SEEK MEDICAL CARE IF:   Your fatigue does not get better.  You have a fever.   You have unintentional weight loss or gain.  You have headaches.   You have difficulty:   Falling asleep.  Sleeping throughout the night.  You feel angry, guilty, anxious, or sad.   You are unable to have a bowel movement (constipation).   You skin is dry.   Your legs or another part of your body is swollen.  SEEK IMMEDIATE MEDICAL CARE IF:   You feel confused.   Your vision is blurry.  You feel faint or pass out.   You have a severe headache.   You have severe abdominal, pelvic, or back pain.   You have chest pain, shortness of breath, or an  irregular or fast heartbeat.   You are unable to urinate or you urinate less than normal.   You develop abnormal bleeding, such as bleeding from the rectum, vagina, nose, lungs, or nipples.  You vomit blood.   You have thoughts about harming yourself or committing suicide.   You are worried that you might harm someone else.    This information is not intended to replace advice given to you by your health care provider. Make sure you discuss any questions you have with your health care provider.   Document Released: 02/14/2007 Document Revised: 05/10/2014 Document Reviewed: 08/21/2013 Elsevier Interactive Patient Education 2016 [place] and Stress Management Stress is a normal reaction to life events. It is what you feel when life demands more than you are used to or more than you can handle. Some stress can be useful. For example, the stress reaction can help you catch the last bus of the day, study for a test, or meet a deadline at work. But stress that occurs too often or for too long can cause problems. It can affect your emotional health and interfere with relationships and normal daily activities. Too much stress can weaken your immune system and increase your risk for physical illness. If you already have a medical problem, stress can make it worse. CAUSES  All sorts of life events may cause stress. An event that causes stress for one person may  not be stressful for another person. Major life events commonly cause stress. These may be positive or negative. Examples include losing your job, moving into a new home, getting married, having a baby, or losing a loved one. Less obvious life events may also cause stress, especially if they occur day after day or in combination. Examples include working long hours, driving in traffic, caring for children, being in debt, or being in a difficult relationship. SIGNS AND SYMPTOMS Stress may cause emotional symptoms including, the  following:  Anxiety. This is feeling worried, afraid, on edge, overwhelmed, or out of control.  Anger. This is feeling irritated or impatient.  Depression. This is feeling sad, down, helpless, or guilty.  Difficulty focusing, remembering, or making decisions. Stress may cause physical symptoms, including the following:   Aches and pains. These may affect your head, neck, back, stomach, or other areas of your body.  Tight muscles or clenched jaw.  Low energy or trouble sleeping. Stress may cause unhealthy behaviors, including the following:   Eating to feel better (overeating) or skipping meals.  Sleeping too little, too much, or both.  Working too much or putting off tasks (procrastination).  Smoking, drinking alcohol, or using drugs to feel better. DIAGNOSIS  Stress is diagnosed through an assessment by your health care provider. Your health care provider will ask questions about your symptoms and any stressful life events.Your health care provider will also ask about your medical history and may order blood tests or other tests. Certain medical conditions and medicine can cause physical symptoms similar to stress. Mental illness can cause emotional symptoms and unhealthy behaviors similar to stress. Your health care provider may refer you to a mental health professional for further evaluation.  TREATMENT  Stress management is the recommended treatment for stress.The goals of stress management are reducing stressful life events and coping with stress in healthy ways.  Techniques for reducing stressful life events include the following:  Stress identification. Self-monitor for stress and identify what causes stress for you. These skills may help you to avoid some stressful events.  Time management. Set your priorities, keep a calendar of events, and learn to say "no." These tools can help you avoid making too many commitments. Techniques for coping with stress include the  following:  Rethinking the problem. Try to think realistically about stressful events rather than ignoring them or overreacting. Try to find the positives in a stressful situation rather than focusing on the negatives.  Exercise. Physical exercise can release both physical and emotional tension. The key is to find a form of exercise you enjoy and do it regularly.  Relaxation techniques. These relax the body and mind. Examples include yoga, meditation, tai chi, biofeedback, deep breathing, progressive muscle relaxation, listening to music, being out in nature, journaling, and other hobbies. Again, the key is to find one or more that you enjoy and can do regularly.  Healthy lifestyle. Eat a balanced diet, get plenty of sleep, and do not smoke. Avoid using alcohol or drugs to relax.  Strong support network. Spend time with family, friends, or other people you enjoy being around.Express your feelings and talk things over with someone you trust. Counseling or talktherapy with a mental health professional may be helpful if you are having difficulty managing stress on your own. Medicine is typically not recommended for the treatment of stress.Talk to your health care provider if you think you need medicine for symptoms of stress. HOME CARE INSTRUCTIONS  Keep all follow-up visits as  directed by your health care provider.  Take all medicines as directed by your health care provider. SEEK MEDICAL CARE IF:  Your symptoms get worse or you start having new symptoms.  You feel overwhelmed by your problems and can no longer manage them on your own. SEEK IMMEDIATE MEDICAL CARE IF:  You feel like hurting yourself or someone else.   This information is not intended to replace advice given to you by your health care provider. Make sure you discuss any questions you have with your health care provider.   Document Released: 10/13/2000 Document Revised: 05/10/2014 Document Reviewed: 12/12/2012 Elsevier  Interactive Patient Education [employer].

## 2016-02-06 LAB — THYROID PANEL WITH TSH
Free Thyroxine Index: 1.7 (ref 1.2–4.9)
T3 Uptake Ratio: 25 % (ref 24–39)
T4 TOTAL: 6.9 ug/dL (ref 4.5–12.0)
TSH: 1.01 u[IU]/mL (ref 0.450–4.500)

## 2016-02-06 LAB — LIPID PANEL
CHOLESTEROL TOTAL: 181 mg/dL (ref 100–199)
Chol/HDL Ratio: 3.2 ratio units (ref 0.0–4.4)
HDL: 57 mg/dL (ref >39)
LDL CALC: 104 mg/dL — AB (ref 0–99)
Triglycerides: 100 mg/dL (ref 0–149)
VLDL Cholesterol Cal: 20 mg/dL (ref 5–40)

## 2016-02-06 LAB — CMP14+EGFR
A/G RATIO: 1.4 (ref 1.2–2.2)
ALBUMIN: 4.4 g/dL (ref 3.5–4.8)
ALK PHOS: 84 IU/L (ref 39–117)
ALT: 9 IU/L (ref 0–32)
AST: 18 IU/L (ref 0–40)
BILIRUBIN TOTAL: 0.5 mg/dL (ref 0.0–1.2)
BUN / CREAT RATIO: 14 (ref 12–28)
BUN: 10 mg/dL (ref 8–27)
CHLORIDE: 101 mmol/L (ref 96–106)
CO2: 24 mmol/L (ref 18–29)
Calcium: 10.4 mg/dL — ABNORMAL HIGH (ref 8.7–10.3)
Creatinine, Ser: 0.69 mg/dL (ref 0.57–1.00)
GFR calc non Af Amer: 83 mL/min/{1.73_m2} (ref >59)
GFR, EST AFRICAN AMERICAN: 96 mL/min/{1.73_m2} (ref >59)
GLUCOSE: 77 mg/dL (ref 65–99)
Globulin, Total: 3.1 g/dL (ref 1.5–4.5)
POTASSIUM: 4 mmol/L (ref 3.5–5.2)
Sodium: 141 mmol/L (ref 134–144)
TOTAL PROTEIN: 7.5 g/dL (ref 6.0–8.5)

## 2016-02-06 LAB — CBC WITH DIFFERENTIAL/PLATELET
Basophils Absolute: 0 10*3/uL (ref 0.0–0.2)
Basos: 0 %
EOS (ABSOLUTE): 0.1 10*3/uL (ref 0.0–0.4)
EOS: 1 %
HEMATOCRIT: 41.5 % (ref 34.0–46.6)
HEMOGLOBIN: 14.1 g/dL (ref 11.1–15.9)
Immature Grans (Abs): 0 10*3/uL (ref 0.0–0.1)
Immature Granulocytes: 0 %
LYMPHS ABS: 2.1 10*3/uL (ref 0.7–3.1)
Lymphs: 37 %
MCH: 30.3 pg (ref 26.6–33.0)
MCHC: 34 g/dL (ref 31.5–35.7)
MCV: 89 fL (ref 79–97)
MONOCYTES: 10 %
MONOS ABS: 0.6 10*3/uL (ref 0.1–0.9)
NEUTROS ABS: 2.9 10*3/uL (ref 1.4–7.0)
NEUTROS PCT: 52 %
Platelets: 327 10*3/uL (ref 150–379)
RBC: 4.65 x10E6/uL (ref 3.77–5.28)
RDW: 14.8 % (ref 12.3–15.4)
WBC: 5.7 10*3/uL (ref 3.4–10.8)

## 2016-02-06 LAB — VITAMIN D 25 HYDROXY (VIT D DEFICIENCY, FRACTURES): VIT D 25 HYDROXY: 33.2 ng/mL (ref 30.0–100.0)

## 2016-02-09 ENCOUNTER — Telehealth: Payer: Self-pay | Admitting: Family

## 2016-02-16 NOTE — Telephone Encounter (Signed)
Spoke with pt's daughter regarding labs Pt's daughter verbalizes understanding

## 2016-02-19 ENCOUNTER — Encounter: Payer: Self-pay | Admitting: Family

## 2016-02-19 ENCOUNTER — Ambulatory Visit (INDEPENDENT_AMBULATORY_CARE_PROVIDER_SITE_OTHER): Payer: Medicare Other | Admitting: Family

## 2016-02-19 ENCOUNTER — Ambulatory Visit: Payer: Medicare Other | Admitting: Pharmacist

## 2016-02-19 VITALS — BP 140/86 | HR 68 | Temp 97.0°F | Ht 61.0 in | Wt 128.8 lb

## 2016-02-19 DIAGNOSIS — I10 Essential (primary) hypertension: Secondary | ICD-10-CM

## 2016-02-19 NOTE — Progress Notes (Signed)
   Subjective:    Patient ID: Alexandria Chen, female    DOB: 05/01/1935, 81 y.o.   MRN: [id number]  Pt presents to the office today to recheck HTN. PT's felodipine was stopped related to dizziness and light-headedness. Pt's BP is at goal today. Hypertension  This is a chronic problem. The current episode started more than 1 year ago. The problem has been resolved since onset. The problem is controlled. Associated symptoms include malaise/fatigue. Pertinent negatives include no blurred vision, headaches, palpitations, peripheral edema or shortness of breath. Risk factors for coronary artery disease include dyslipidemia and post-menopausal state. Past treatments include ACE inhibitors. The current treatment provides moderate improvement. There is no history of kidney disease, CAD/MI, CVA or heart failure.      Review of Systems  Constitutional: Positive for malaise/fatigue.  Eyes: Negative for blurred vision.  Respiratory: Negative for shortness of breath.   Cardiovascular: Negative for palpitations.  Gastrointestinal: Positive for constipation.  Neurological: Negative for headaches.  Psychiatric/Behavioral: The patient is nervous/anxious.   All other systems reviewed and are negative.      Objective:   Physical Exam  Constitutional: She is oriented to person, place, and time. She appears well-developed and well-nourished. No distress.  HENT:  Head: Normocephalic.  Eyes: Pupils are equal, round, and reactive to light.  Cardiovascular: Normal rate, regular rhythm, normal heart sounds and intact distal pulses.   No murmur heard. Pulmonary/Chest: Effort normal and breath sounds normal. No respiratory distress. She has no wheezes.  Abdominal: Soft. Bowel sounds are normal. She exhibits no distension. There is no tenderness.  Musculoskeletal: Normal range of motion. She exhibits no edema or tenderness.  Neurological: She is alert and oriented to person, place, and time.  Skin: Skin is warm  and dry.  Psychiatric: She has a normal mood and affect. Her behavior is normal. Judgment and thought content normal.  Vitals reviewed.     BP 140/86   Pulse 68   Temp 97 F (36.1 C) (Oral)   Ht '5\' 1"'$  (1.549 m)   Wt 128 lb 12.8 oz (58.4 kg)   BMI 24.34 kg/m      Assessment & Plan:  1. Essential hypertension, benign -Dash diet information given -Exercise encouraged - Stress Management  -Continue current meds -RTO in 3 months  Evelina Dun, FNP

## 2016-02-19 NOTE — Patient Instructions (Signed)
Calcium & Vitamin D: The Facts  Why is calcium and vitamin D consumption important? Calcium: . Most Americans do not consume adequate amounts of calcium! Calcium is required for proper muscle function, nerve communication, bone support, and many other functions in the body.  . The body uses bones as a source of calcium. Bones 'remodel' themselves continuously - the body constantly breaks bone down to release calcium and rebuilds bones by replacing calcium in the bone later.  . As we get older, the rate of bone breakdown occurs faster than bone rebuilding which could lead to osteopenia, osteoporosis, and possible fractures.   Vitamin D: . People naturally make vitamin D in the body when sunlight hits the skin and triggers a process that leads to vitamin D production. This natural vitamin D production requires about 10-15 minutes of sun exposure on the hands, arms, and face at least 2-3 times per week. However, due to decreased sun exposure and the use of sunscreen, most people will need to get additional vitamin D from foods or supplements. Your doctor can measure your body's vitamin D level through a simple blood test to determine your daily vitamin D needs.  . Vitamin D is used to help the body absorb calcium, maintain bone health, help the immune system, and reduce inflammation. It also plays a role in muscle performance, balance and risk of falling.  . Vitamin D deficiency can lead to osteomalacia or softening of the bones, bone pain, and muscle weakness.   The recommended daily allowance of Calcium and Vitamin D varies for different age groups. Age group Calcium (mg) Vitamin D (IU)  Females and Males: Age 55-50 1000 mg 600 IU  Females: Age 28- 86 1200 mg 600 IU  Males: Age 30-70 1000 mg 600 IU  Females and Males: Age 54+ 1200 mg 800 IU  Pregnant/lactating Females age 82-50 1000 mg 600 IU   How much Calcium do you get in your diet? Calcium Intake # of servings per day  Total calcium (mg)   Skim milk, 2% milk (1 cup) _________ x 300 mg   Yogurt (1 small container) _________ x 200 mg   Cheese (1oz) _________ x 200 mg   Cottage Cheese (1 cup)             ________ x 150 mg   Almond milk (1 cup) _________ x 450 mg   Fortified Orange Juice (1 cup) _________ x 300 mg   Broccoli or spinach ( 1 cup) _________ x 100 mg   Salmon (3 oz) _________ x 150 mg    Almonds (1/4 cup) _______ x 90 mg      How do we get Calcium and Vitamin D in our diet? Calcium: . Obtaining calcium from the diet is the most preferred way to reach the recommended daily goal. If this goal is not reached through diet, calcium supplements are available.  . Calcium is found in many foods including: dairy products, dark leafy vegetables (like broccoli, kale, and spinach), fish, and fortified products like juices and cereals.  . The food label will have a %DV (percent daily value) listed showing the amount of calcium per serving. To determine the total mg per serving, simply replace the % with zero (0).  For example, Almond Breeze almond milk contains 45% DV of calcium or 4107m per 1 cup.  . You can increase the amount of calcium in your diet by using more calcium products in your daily meals. Use yogurt and fruit to  make smoothies or use yogurt to top baked potatoes or make whipped potatoes. Sprinkle low fat cheese onto salads or into egg white omelets. You can even add non-fat dry milk powder ('300mg'$  calcium per 1/3 cup) to hot cereals, meat loaf, soups, or potatoes.  . Calcium supplements come in many forms including tablets, chewables, and gummies. Be sure to read the label to determine the correct number of tablets per serving and whether or not to take the supplement with food.  . Calcium carbonate products (Oscal, Caltrate, and Viactiv) are generally better absorbed when taken with food while calcium citrate products like Citracal can be taken with or without food.  . The body can only absorb about 600 mg of calcium  at one time. It is recommended to take calcium supplements in small amounts several times per day.  However, taking it all at once is better than not taking it at all. . Increasing your intake of calcium is essential for bone health, but may also lead to some side effects like constipation, increased gas, bloating or abdominal cramping. To help reduce these side effects, start with 1 tablet per day and slowly increase your intake of the supplement to the recommended doses. It is also recommended that you drink plenty of water each day. Vitamin D: . Very few foods naturally contain vitamin D. However, it is found in saltwater fish (like tuna, salmon and mackerel), beef liver, egg yolks, cheese and vitamin D fortified foods (like yogurt, cereals, orange juice and milk) . The amount of vitamin D in each food or product is listed as %DV on the product label. To determine the total amount of vitamin D per serving, drop the % sign and multiply the number by 4. For example, 1 cup of Almond Breeze almond milk contains 25% DV vitamin D or 100 IU per serving (25 x 4 =100). . Vitamin D is also found in multivitamins and supplements and may be listed as ergocalciferol (vitamin D2) or cholecalciferol (vitamin D3). Each of these forms of vitamin D are equivalent and the daily recommended intake will vary based on your age and the vitamin D levels in your body. Follow your doctor's recommendation for vitamin D intake.

## 2016-02-19 NOTE — Patient Instructions (Signed)
DASH Eating Plan °DASH stands for "Dietary Approaches to Stop Hypertension." The DASH eating plan is a healthy eating plan that has been shown to reduce high blood pressure (hypertension). Additional health benefits may include reducing the risk of type 2 diabetes mellitus, heart disease, and stroke. The DASH eating plan may also help with weight loss. °WHAT DO I NEED TO KNOW ABOUT THE DASH EATING PLAN? °For the DASH eating plan, you will follow these general guidelines: °· Choose foods with a percent daily value for sodium of less than 5% (as listed on the food label). °· Use salt-free seasonings or herbs instead of table salt or sea salt. °· Check with your health care provider or pharmacist before using salt substitutes. °· Eat lower-sodium products, often labeled as "lower sodium" or "no salt added." °· Eat fresh foods. °· Eat more vegetables, fruits, and low-fat dairy products. °· Choose whole grains. Look for the word "whole" as the first word in the ingredient list. °· Choose fish and skinless chicken or turkey more often than red meat. Limit fish, poultry, and meat to 6 oz (170 g) each day. °· Limit sweets, desserts, sugars, and sugary drinks. °· Choose heart-healthy fats. °· Limit cheese to 1 oz (28 g) per day. °· Eat more home-cooked food and less restaurant, buffet, and fast food. °· Limit fried foods. °· Cook foods using methods other than frying. °· Limit canned vegetables. If you do use them, rinse them well to decrease the sodium. °· When eating at a restaurant, ask that your food be prepared with less salt, or no salt if possible. °WHAT FOODS CAN I EAT? °Seek help from a dietitian for individual calorie needs. °Grains °Whole grain or whole wheat bread. Brown rice. Whole grain or whole wheat pasta. Quinoa, bulgur, and whole grain cereals. Low-sodium cereals. Corn or whole wheat flour tortillas. Whole grain cornbread. Whole grain crackers. Low-sodium crackers. °Vegetables °Fresh or frozen vegetables  (raw, steamed, roasted, or grilled). Low-sodium or reduced-sodium tomato and vegetable juices. Low-sodium or reduced-sodium tomato sauce and paste. Low-sodium or reduced-sodium canned vegetables.  °Fruits °All fresh, canned (in natural juice), or frozen fruits. °Meat and Other Protein Products °Ground beef (85% or leaner), grass-fed beef, or beef trimmed of fat. Skinless chicken or turkey. Ground chicken or turkey. Pork trimmed of fat. All fish and seafood. Eggs. Dried beans, peas, or lentils. Unsalted nuts and seeds. Unsalted canned beans. °Dairy °Low-fat dairy products, such as skim or 1% milk, 2% or reduced-fat cheeses, low-fat ricotta or cottage cheese, or plain low-fat yogurt. Low-sodium or reduced-sodium cheeses. °Fats and Oils °Tub margarines without trans fats. Light or reduced-fat mayonnaise and salad dressings (reduced sodium). Avocado. Safflower, olive, or canola oils. Natural peanut or almond butter. °Other °Unsalted popcorn and pretzels. °The items listed above may not be a complete list of recommended foods or beverages. Contact your dietitian for more options. °WHAT FOODS ARE NOT RECOMMENDED? °Grains °White bread. White pasta. White rice. Refined cornbread. Bagels and croissants. Crackers that contain trans fat. °Vegetables °Creamed or fried vegetables. Vegetables in a cheese sauce. Regular canned vegetables. Regular canned tomato sauce and paste. Regular tomato and vegetable juices. °Fruits °Dried fruits. Canned fruit in light or heavy syrup. Fruit juice. °Meat and Other Protein Products °Fatty cuts of meat. Ribs, chicken wings, bacon, sausage, bologna, salami, chitterlings, fatback, hot dogs, bratwurst, and packaged luncheon meats. Salted nuts and seeds. Canned beans with salt. °Dairy °Whole or 2% milk, cream, half-and-half, and cream cheese. Whole-fat or sweetened yogurt. Full-fat   cheeses or blue cheese. Nondairy creamers and whipped toppings. Processed cheese, cheese spreads, or cheese  curds. °Condiments °Onion and garlic salt, seasoned salt, table salt, and sea salt. Canned and packaged gravies. Worcestershire sauce. Tartar sauce. Barbecue sauce. Teriyaki sauce. Soy sauce, including reduced sodium. Steak sauce. Fish sauce. Oyster sauce. Cocktail sauce. Horseradish. Ketchup and mustard. Meat flavorings and tenderizers. Bouillon cubes. Hot sauce. Tabasco sauce. Marinades. Taco seasonings. Relishes. °Fats and Oils °Butter, stick margarine, lard, shortening, ghee, and bacon fat. Coconut, palm kernel, or palm oils. Regular salad dressings. °Other °Pickles and olives. Salted popcorn and pretzels. °The items listed above may not be a complete list of foods and beverages to avoid. Contact your dietitian for more information. °WHERE CAN I FIND MORE INFORMATION? °[employer]: www.nhlbi.nih.gov/health/health-topics/topics/dash/ °  °This information is not intended to replace advice given to you by your health care provider. Make sure you discuss any questions you have with your health care provider. °  °Document Released: 04/08/2011 Document Revised: 05/10/2014 Document Reviewed: 02/21/2013 °Elsevier Interactive Patient Education ©2016 [employer] ° °Hypertension °Hypertension, commonly called high blood pressure, is when the force of blood pumping through your arteries is too strong. Your arteries are the blood vessels that carry blood from your heart throughout your body. A blood pressure reading consists of a higher number over a lower number, such as 110/72. The higher number (systolic) is the pressure inside your arteries when your heart pumps. The lower number (diastolic) is the pressure inside your arteries when your heart relaxes. Ideally you want your blood pressure below 120/80. °Hypertension forces your heart to work harder to pump blood. Your arteries may become narrow or stiff. Having untreated or uncontrolled hypertension can cause heart attack, stroke, kidney  disease, and other problems. °RISK FACTORS °Some risk factors for high blood pressure are controllable. Others are not.  °Risk factors you cannot control include:  °· Race. You may be at higher risk if you are African American. °· Age. Risk increases with age. °· Gender. Men are at higher risk than women before age 45 years. After age 65, women are at higher risk than men. °Risk factors you can control include: °· Not getting enough exercise or physical activity. °· Being overweight. °· Getting too much fat, sugar, calories, or salt in your diet. °· Drinking too much alcohol. °SIGNS AND SYMPTOMS °Hypertension does not usually cause signs or symptoms. Extremely high blood pressure (hypertensive crisis) may cause headache, anxiety, shortness of breath, and nosebleed. °DIAGNOSIS °To check if you have hypertension, your health care provider will measure your blood pressure while you are seated, with your arm held at the level of your heart. It should be measured at least twice using the same arm. Certain conditions can cause a difference in blood pressure between your right and left arms. A blood pressure reading that is higher than normal on one occasion does not mean that you need treatment. If it is not clear whether you have high blood pressure, you may be asked to return on a different day to have your blood pressure checked again. Or, you may be asked to monitor your blood pressure at home for 1 or more weeks. °TREATMENT °Treating high blood pressure includes making lifestyle changes and possibly taking medicine. Living a healthy lifestyle can help lower high blood pressure. You may need to change some of your habits. °Lifestyle changes may include: °· Following the DASH diet. This diet is high in fruits, vegetables, and whole   grains. It is low in salt, red meat, and added sugars. °· Keep your sodium intake below 2,300 mg per day. °· Getting at least 30-45 minutes of aerobic exercise at least 4 times per  week. °· Losing weight if necessary. °· Not smoking. °· Limiting alcoholic beverages. °· Learning ways to reduce stress. °Your health care provider may prescribe medicine if lifestyle changes are not enough to get your blood pressure under control, and if one of the following is true: °· You are 18-59 years of age and your systolic blood pressure is above 140. °· You are 60 years of age or older, and your systolic blood pressure is above 150. °· Your diastolic blood pressure is above 90. °· You have diabetes, and your systolic blood pressure is over 140 or your diastolic blood pressure is over 90. °· You have kidney disease and your blood pressure is above 140/90. °· You have heart disease and your blood pressure is above 140/90. °Your personal target blood pressure may vary depending on your medical conditions, your age, and other factors. °HOME CARE INSTRUCTIONS °· Have your blood pressure rechecked as directed by your health care provider.   °· Take medicines only as directed by your health care provider. Follow the directions carefully. Blood pressure medicines must be taken as prescribed. The medicine does not work as well when you skip doses. Skipping doses also puts you at risk for problems. °· Do not smoke.   °· Monitor your blood pressure at home as directed by your health care provider.  °SEEK MEDICAL CARE IF:  °· You think you are having a reaction to medicines taken. °· You have recurrent headaches or feel dizzy. °· You have swelling in your ankles. °· You have trouble with your vision. °SEEK IMMEDIATE MEDICAL CARE IF: °· You develop a severe headache or confusion. °· You have unusual weakness, numbness, or feel faint. °· You have severe chest or abdominal pain. °· You vomit repeatedly. °· You have trouble breathing. °MAKE SURE YOU:  °· Understand these instructions. °· Will watch your condition. °· Will get help right away if you are not doing well or get worse. °  °This information is not intended to  replace advice given to you by your health care provider. Make sure you discuss any questions you have with your health care provider. °  °Document Released: 04/19/2005 Document Revised: 09/03/2014 Document Reviewed: 02/09/2013 °Elsevier Interactive Patient Education ©2016 [employer] Inc. ° °

## 2016-03-02 ENCOUNTER — Ambulatory Visit (INDEPENDENT_AMBULATORY_CARE_PROVIDER_SITE_OTHER): Payer: Medicare Other | Admitting: Pharmacist

## 2016-03-02 ENCOUNTER — Encounter: Payer: Self-pay | Admitting: Pharmacist

## 2016-03-02 VITALS — BP 120/80 | HR 72 | Ht 61.5 in | Wt 129.0 lb

## 2016-03-02 DIAGNOSIS — Z0000 Encounter for general adult medical examination without abnormal findings: Secondary | ICD-10-CM | POA: Diagnosis not present

## 2016-03-02 DIAGNOSIS — M8580 Other specified disorders of bone density and structure, unspecified site: Secondary | ICD-10-CM

## 2016-03-02 MED ORDER — ATORVASTATIN CALCIUM 40 MG PO TABS: ORAL_TABLET | ORAL | 0 refills | Status: DC

## 2016-03-02 NOTE — Progress Notes (Signed)
Patient ID: Alexandria Chen, female   DOB: Oct 09, 1934, 81 y.o.   MRN: [id number]     Subjective:   Alexandria Chen is a 81 y.o. female who presents for an Initial Medicare Annual Wellness Visit.  Alexandria Chen lives alone though her daughter lives across that street and checks on her regularly.  Her daughter is present with her in the office today. She continues to perform ADLs but states she is too tired to do exercise.   Current Medications (verified) Outpatient Encounter Prescriptions as of 03/02/2016  Medication Sig  . aspirin 81 MG tablet Take 1 tablet (81 mg total) by mouth daily. For platelet aggregation.  . busPIRone (BUSPAR) 15 MG tablet Take 1 tablet (15 mg total) by mouth 2 (two) times daily.  . Calcium Carb-Cholecalciferol (CALCIUM 500+D3) 500-400 MG-UNIT TABS Take 1 tablet by mouth 2 (two) times daily.  . fish oil-omega-3 fatty acids 1000 MG capsule Take 3 capsules (3 g total) by mouth daily. For hyperlipidemia.  Marland Kitchen FLUoxetine (PROZAC) 40 MG capsule Take one capsule daily ('40mg'$ ) by mouth for anxiety and depression.  . gabapentin (NEURONTIN) 100 MG capsule Take 1 capsule (100 mg total) by mouth at bedtime.  Marland Kitchen lisinopril (PRINIVIL,ZESTRIL) 10 MG tablet Take 1 tablet (10 mg total) by mouth daily.  . Vitamin D, Cholecalciferol, 400 UNITS CAPS Take 400 Units by mouth daily.  Marland Kitchen atorvastatin (LIPITOR) 40 MG tablet TAKE 1 TABLET (40 MG TOTAL) BY MOUTH DAILY. FOR HYPERLIPIDEMIA  . fluticasone (FLONASE) 50 MCG/ACT nasal spray Place 2 sprays into both nostrils 2 (two) times daily. Use as needed (Patient not taking: Reported on 03/02/2016)  . meclizine (ANTIVERT) 25 MG tablet 0.5 mg 2 (two) times daily as needed.   . [DISCONTINUED] atorvastatin (LIPITOR) 40 MG tablet TAKE 1 TABLET (40 MG TOTAL) BY MOUTH DAILY. FOR HYPERLIPIDEMIA (Patient not taking: Reported on 03/02/2016)   No facility-administered encounter medications on file as of 03/02/2016.     Allergies (verified) Review of patient's  allergies indicates no known allergies.   History: Past Medical History:  Diagnosis Date  . Alcohol abuse, in remission   . Anxiety   . Depression   . HTN (hypertension)   . Osteopenia    History reviewed. No pertinent surgical history. Family History  Problem Relation Age of Onset  . Anxiety disorder Mother   . Dementia Mother   . Hypertension Mother   . Alcohol abuse Father   . Arthritis Father   . Alcohol abuse Brother   . ADD / ADHD Neg Hx   . Drug abuse Neg Hx   . Bipolar disorder Neg Hx   . Depression Neg Hx   . OCD Neg Hx   . Paranoid behavior Neg Hx   . Schizophrenia Neg Hx   . Seizures Neg Hx   . Sexual abuse Neg Hx   . Physical abuse Neg Hx    Social History   Occupational History  . Not on file.   Social History Main Topics  . Smoking status: Current Every Day Smoker    Packs/day: 1.00    Years: 50.00    Types: Cigarettes    Start date: 05/03/1956  . Smokeless tobacco: Never Used     Comment: about a packa day as of 10/17/2012  . Alcohol use No  . Drug use: No  . Sexual activity: No    Do you feel safe at home?  Yes  Dietary issues and exercise activities: Current Exercise Habits: The patient  does not participate in regular exercise at present, Exercise limited by: None identified  Current Dietary habits:  No following any particular diet.  Eats 2-3 meals per day.  Occasional snacks  Objective:    Today's Vitals   03/02/16 1154  BP: 120/80  Pulse: 72  Weight: 129 lb (58.5 kg)  Height: 5' 1.5" (1.562 m)  PainSc: 0-No pain   Body mass index is 23.98 kg/m.  Activities of Daily Living In your present state of health, do you have any difficulty performing the following activities: 03/02/2016 11/05/2015  Hearing? Y N  Vision? N N  Difficulty concentrating or making decisions? Y N  Walking or climbing stairs? Y N  Dressing or bathing? N N  Doing errands, shopping? N N  Preparing Food and eating ? N -  Using the Toilet? N -  In the past six  months, have you accidently leaked urine? N -  Do you have problems with loss of bowel control? N -  Managing your Medications? Y -  Managing your Finances? N -  Housekeeping or managing your Housekeeping? N -  Some recent data might be hidden     Cardiac Risk Factors include: advanced age (>62mn, >>3women);dyslipidemia;hypertension;sedentary lifestyle;smoking/ tobacco exposure  Depression Screen PHQ 2/9 Scores 03/02/2016 02/19/2016 02/05/2016 11/05/2015  PHQ - 2 Score 3 - - 0  PHQ- 9 Score 5 - - -  Exception Documentation - Other- indicate reason in comment box Other- indicate reason in comment box -  Not completed - (No Data) Goes to Dr. RHarrington Challengerfor counseling on depression and anxiety. -     Fall Risk Fall Risk  03/02/2016 02/19/2016 02/05/2016 10/21/2015 04/07/2015  Falls in the past year? Yes No No No Yes  Number falls in past yr: 1 - - - 2 or more  Injury with Fall? No - - - No  Risk for fall due to : Impaired balance/gait - - - -  Follow up Falls prevention discussed - - - -    Cognitive Function: MMSE - Mini Mental State Exam 03/02/2016  Orientation to time 2  Orientation to Place 5  Registration 3  Attention/ Calculation 4  Recall 3  Language- name 2 objects 2  Language- repeat 1  Language- follow 3 step command 3  Language- read & follow direction 1  Write a sentence 1  Copy design 0  Total score 25    Immunizations and Health Maintenance Immunization History  Administered Date(s) Administered  . Influenza, High Dose Seasonal PF 01/25/2014  . Influenza,inj,Quad PF,36+ Mos 04/17/2013, 03/11/2015, 02/05/2016  . Pneumococcal Conjugate-13 01/14/2014  . Pneumococcal Polysaccharide-23 03/11/2015   Health Maintenance Due  Topic Date Due  . TETANUS/TDAP  08/01/1955  . ZOSTAVAX  07/31/1996    Patient Care Team: CSharion Balloon FNP as PCP - General (Nurse Practitioner)  Indicate any recent Medical Services you may have received from other than [facility] providers in  the past year (date may be approximate).    Assessment:    Annual Wellness Visit  Osteopenia - 10 years Frax estimate is 2.2% for hip fractureand 6.7% for major osteoporosis fracture.  Screening Tests Health Maintenance  Topic Date Due  . TETANUS/TDAP  08/01/1955  . ZOSTAVAX  07/31/1996  . DEXA SCAN  02/09/2018  . INFLUENZA VACCINE  Completed  . PNA vac Low Risk Adult  Completed      Plan:   During the course of the visit RAleawas educated and counseled about the following  appropriate screening and preventive services:   Vaccines to include Pneumoccal, Influenza, Td, Zostavax - due Zostavax and Tdap - checked prices.  Zostavax $226 and Tdap / Boostrix $61.78.  Patient declined due to cost  Colorectal cancer screening - patient declined  Cardiovascular disease screening - BP was at goal today;  Last EKG 09/2015  Diabetes screening - UTD  BMD / DEXA - UTD  Osteopenia - start calcium supplements - goal is '1200mg'$  daily from diet and supplements.    Fall Prevention discussed   Referral made to PT for balance assessment  Mammogram -patient declined  Recommended CT of lungs due to smoking history but patient declined - she will consider and contact office if she changes her mind  Glaucoma screening /  Eye Exam - UTD  Nutrition counseling - discussed calcium rich foods  Smoking cessation counseling - patient declined  Physical Activity - referral to physical therapy  Audiologist - recommend follow up to adjust hearing aids    Orders Placed This Encounter  Procedures  . Ambulatory referral to Physical Therapy    Referral Priority:   Routine    Referral Type:   Physical Medicine    Referral Reason:   Specialty Services Required    Requested Specialty:   Physical Therapy    Number of Visits Requested:   1    Patient Instructions (the written plan) were given to the patient.   Cherre Robins, PharmD   03/05/2016

## 2016-03-02 NOTE — Patient Instructions (Addendum)
Alexandria Chen , Thank you for taking time to come for your Medicare Wellness Visit. I appreciate your ongoing commitment to your health goals. Please review the following plan we discussed and let me know if I can assist you in the future.   These are the goals we discussed: Recommend follow with audiologist about hearing aid.  Consider CT of lung to screen for lung cancer - call office and we can schedule at [facility] when you want.  I have sent in referral for physical therapy to assess balance   Increase calcium 551m + vitamin D - take 1 tablet twice a day.  Continue to eat yogurt.     This is a list of the screening recommended for you and due dates:  Health Maintenance  Topic Date Due  . Tetanus Vaccine  08/01/1953  . Shingles Vaccine  07/31/1996  . Flu Shot  Completed  . DEXA scan (bone density measurement)  Completed  . Pneumonia vaccines  Completed   Fall Prevention in the Home  Falls can cause injuries and can affect people from all age groups. There are many simple things that you can do to make your home safe and to help prevent falls. WHAT CAN I DO ON THE OUTSIDE OF MY HOME?  Regularly repair the edges of walkways and driveways and fix any cracks.  Remove high doorway thresholds.  Trim any shrubbery on the main path into your home.  Use bright outdoor lighting.  Clear walkways of debris and clutter, including tools and rocks.  Regularly check that handrails are securely fastened and in good repair. Both sides of any steps should have handrails.  Install guardrails along the edges of any raised decks or porches.  Have leaves, snow, and ice cleared regularly.  Use sand or salt on walkways during winter months.  In the garage, clean up any spills right away, including grease or oil spills. WHAT CAN I DO IN THE BATHROOM?  Use night lights.  Install grab bars by the toilet and in the tub and shower. Do not use towel bars as grab bars.  Use non-skid mats or  decals on the floor of the tub or shower.  If you need to sit down while you are in the shower, use a plastic, non-slip stool..[place] Kitchen Keep the floor dry. Immediately clean up any water that spills on the floor.  Remove soap buildup in the tub or shower on a regular basis.  Attach bath mats securely with double-sided non-slip rug tape.  Remove throw rugs and other tripping hazards from the floor. WHAT CAN I DO IN THE BEDROOM?  Use night lights.  Make sure that a bedside light is easy to reach.  Do not use oversized bedding that drapes onto the floor.  Have a firm chair that has side arms to use for getting dressed.  Remove throw rugs and other tripping hazards from the floor. WHAT CAN I DO IN THE KITCHEN?   Clean up any spills right away.  Avoid walking on wet floors.  Place frequently used items in easy-to-reach places.  If you need to reach for something above you, use a sturdy step stool that has a grab bar.  Keep electrical cables out of the way.  Do not use floor polish or wax that makes floors slippery. If you have to use wax, make sure that it is non-skid floor wax.  Remove throw rugs and other tripping hazards from the floor. WHAT CAN I DO IN  THE STAIRWAYS?  Do not leave any items on the stairs.  Make sure that there are handrails on both sides of the stairs. Fix handrails that are broken or loose. Make sure that handrails are as long as the stairways.  Check any carpeting to make sure that it is firmly attached to the stairs. Fix any carpet that is loose or worn.  Avoid having throw rugs at the top or bottom of stairways, or secure the rugs with carpet tape to prevent them from moving.  Make sure that you have a light switch at the top of the stairs and the bottom of the stairs. If you do not have them, have them installed. WHAT ARE SOME OTHER FALL PREVENTION TIPS?  Wear closed-toe shoes that fit well and support your feet. Wear shoes that have rubber soles or low  heels.  When you use a stepladder, make sure that it is completely opened and that the sides are firmly locked. Have someone hold the ladder while you are using it. Do not climb a closed stepladder.  Add color or contrast paint or tape to grab bars and handrails in your home. Place contrasting color strips on the first and last steps.  Use mobility aids as needed, such as canes, walkers, scooters, and crutches.  Turn on lights if it is dark. Replace any light bulbs that burn out.  Set up furniture so that there are clear paths. Keep the furniture in the same spot.  Fix any uneven floor surfaces.  Choose a carpet design that does not hide the edge of steps of a stairway.  Be aware of any and all pets.  Review your medicines with your healthcare provider. Some medicines can cause dizziness or changes in blood pressure, which increase your risk of falling. Talk with your health care provider about other ways that you can decrease your risk of falls. This may include working with a physical therapist or trainer to improve your strength, balance, and endurance.   This information is not intended to replace advice given to you by your health care provider. Make sure you discuss any questions you have with your health care provider.   Document Released: 04/09/2002 Document Revised: 09/03/2014 Document Reviewed: 05/24/2014 Elsevier Interactive Patient Education 2016 [place] Maintenance, Female Adopting a healthy lifestyle and getting preventive care can go a long way to promote health and wellness. Talk with your health care provider about what schedule of regular examinations is right for you. This is a good chance for you to check in with your provider about disease prevention and staying healthy. In between checkups, there are plenty of things you can do on your own. Experts have done a lot of research about which lifestyle changes and preventive measures are most likely to keep  you healthy. Ask your health care provider for more information. WEIGHT AND DIET  Eat a healthy diet  Be sure to include plenty of vegetables, fruits, low-fat dairy products, and lean protein.  Do not eat a lot of foods high in solid fats, added sugars, or salt.  Get regular exercise. This is one of the most important things you can do for your health.  Most adults should exercise for at least 150 minutes each week. The exercise should increase your heart rate and make you sweat (moderate-intensity exercise).  Most adults should also do strengthening exercises at least twice a week. This is in addition to the moderate-intensity exercise.  Maintain a healthy weight  Body mass index (BMI) is a measurement that can be used to identify possible weight problems. It estimates body fat based on height and weight. Your health care provider can help determine your BMI and help you achieve or maintain a healthy weight.  For females 79 years of age and older:   A BMI below 18.5 is considered underweight.  A BMI of 18.5 to 24.9 is normal.  A BMI of 25 to 29.9 is considered overweight.  A BMI of 30 and above is considered obese.  Watch levels of cholesterol and blood lipids  You should start having your blood tested for lipids and cholesterol at 79 years of age, then have this test every 5 years.  You may need to have your cholesterol levels checked more often if:  Your lipid or cholesterol levels are high.  You are older than 79 years of age.  You are at high risk for heart disease.  CANCER SCREENING   Lung Cancer  Lung cancer screening is recommended for adults 70-93 years old who are at high risk for lung cancer because of a history of smoking.  A yearly low-dose CT scan of the lungs is recommended for people who:  Currently smoke.  Have quit within the past 15 years.  Have at least a 30-pack-year history of smoking. A pack year is smoking an average of one pack of  cigarettes a day for 1 year.  Yearly screening should continue until it has been 15 years since you quit.  Yearly screening should stop if you develop a health problem that would prevent you from having lung cancer treatment.  Breast Cancer  Practice breast self-awareness. This means understanding how your breasts normally appear and feel.  It also means doing regular breast self-exams. Let your health care provider know about any changes, no matter how small.  If you are in your 20s or 30s, you should have a clinical breast exam (CBE) by a health care provider every 1-3 years as part of a regular health exam.  If you are 40 or older, have a CBE every year. Also consider having a breast X-ray (mammogram) every year.  If you have a family history of breast cancer, talk to your health care provider about genetic screening.  If you are at high risk for breast cancer, talk to your health care provider about having an MRI and a mammogram every year.  Breast cancer gene (BRCA) assessment is recommended for women who have family members with BRCA-related cancers. BRCA-related cancers include:  Breast.  Ovarian.  Tubal.  Peritoneal cancers.  Results of the assessment will determine the need for genetic counseling and BRCA1 and BRCA2 testing. Cervical Cancer Your health care provider may recommend that you be screened regularly for cancer of the pelvic organs (ovaries, uterus, and vagina). This screening involves a pelvic examination, including checking for microscopic changes to the surface of your cervix (Pap test). You may be encouraged to have this screening done every 3 years, beginning at age 68.  For women ages 8-65, health care providers may recommend pelvic exams and Pap testing every 3 years, or they may recommend the Pap and pelvic exam, combined with testing for human papilloma virus (HPV), every 5 years. Some types of HPV increase your risk of cervical cancer. Testing for HPV  may also be done on women of any age with unclear Pap test results.  Other health care providers may not recommend any screening for nonpregnant women who are considered  low risk for pelvic cancer and who do not have symptoms. Ask your health care provider if a screening pelvic exam is right for you.  If you have had past treatment for cervical cancer or a condition that could lead to cancer, you need Pap tests and screening for cancer for at least 20 years after your treatment. If Pap tests have been discontinued, your risk factors (such as having a new sexual partner) need to be reassessed to determine if screening should resume. Some women have medical problems that increase the chance of getting cervical cancer. In these cases, your health care provider may recommend more frequent screening and Pap tests. Colorectal Cancer  This type of cancer can be detected and often prevented.  Routine colorectal cancer screening usually begins at 79 years of age and continues through 79 years of age.  Your health care provider may recommend screening at an earlier age if you have risk factors for colon cancer.  Your health care provider may also recommend using home test kits to check for hidden blood in the stool.  A small camera at the end of a tube can be used to examine your colon directly (sigmoidoscopy or colonoscopy). This is done to check for the earliest forms of colorectal cancer.  Routine screening usually begins at age 8.  Direct examination of the colon should be repeated every 5-10 years through 79 years of age. However, you may need to be screened more often if early forms of precancerous polyps or small growths are found. Skin Cancer  Check your skin from head to toe regularly.  Tell your health care provider about any new moles or changes in moles, especially if there is a change in a mole's shape or color.  Also tell your health care provider if you have a mole that is larger than  the size of a pencil eraser.  Always use sunscreen. Apply sunscreen liberally and repeatedly throughout the day.  Protect yourself by wearing long sleeves, pants, a wide-brimmed hat, and sunglasses whenever you are outside. HEART DISEASE, DIABETES, AND HIGH BLOOD PRESSURE   High blood pressure causes heart disease and increases the risk of stroke. High blood pressure is more likely to develop in:  People who have blood pressure in the high end of the normal range (130-139/85-89 mm Hg).  People who are overweight or obese.  People who are African American.  If you are 66-74 years of age, have your blood pressure checked every 3-5 years. If you are 79 years of age or older, have your blood pressure checked every year. You should have your blood pressure measured twice--once when you are at a hospital or clinic, and once when you are not at a hospital or clinic. Record the average of the two measurements. To check your blood pressure when you are not at a hospital or clinic, you can use:  An automated blood pressure machine at a pharmacy.  A home blood pressure monitor.  If you are between 74 years and 98 years old, ask your health care provider if you should take aspirin to prevent strokes.  Have regular diabetes screenings. This involves taking a blood sample to check your fasting blood sugar level.  If you are at a normal weight and have a low risk for diabetes, have this test once every three years after 79 years of age.  If you are overweight and have a high risk for diabetes, consider being tested at a younger age or more  often. PREVENTING INFECTION  Hepatitis B  If you have a higher risk for hepatitis B, you should be screened for this virus. You are considered at high risk for hepatitis B if:  You were born in a country where hepatitis B is common. Ask your health care provider which countries are considered high risk.  Your parents were born in a high-risk country, and you  have not been immunized against hepatitis B (hepatitis B vaccine).  You have HIV or AIDS.  You use needles to inject street drugs.  You live with someone who has hepatitis B.  You have had sex with someone who has hepatitis B.  You get hemodialysis treatment.  You take certain medicines for conditions, including cancer, organ transplantation, and autoimmune conditions. Hepatitis C  Blood testing is recommended for:  Everyone born from [birth year] through 1963.  Anyone with known risk factors for hepatitis C. Sexually transmitted infections (STIs)  You should be screened for sexually transmitted infections (STIs) including gonorrhea and chlamydia if:  You are sexually active and are younger than 79 years of age.  You are older than 79 years of age and your health care provider tells you that you are at risk for this type of infection.  Your sexual activity has changed since you were last screened and you are at an increased risk for chlamydia or gonorrhea. Ask your health care provider if you are at risk.  If you do not have HIV, but are at risk, it may be recommended that you take a prescription medicine daily to prevent HIV infection. This is called pre-exposure prophylaxis (PrEP). You are considered at risk if:  You are sexually active and do not regularly use condoms or know the HIV status of your partner(s).  You take drugs by injection.  You are sexually active with a partner who has HIV. Talk with your health care provider about whether you are at high risk of being infected with HIV. If you choose to begin PrEP, you should first be tested for HIV. You should then be tested every 3 months for as long as you are taking PrEP.  PREGNANCY   If you are premenopausal and you may become pregnant, ask your health care provider about preconception counseling.  If you may become pregnant, take 400 to 800 micrograms (mcg) of folic acid every day.  If you want to prevent pregnancy,  talk to your health care provider about birth control (contraception). OSTEOPOROSIS AND MENOPAUSE   Osteoporosis is a disease in which the bones lose minerals and strength with aging. This can result in serious bone fractures. Your risk for osteoporosis can be identified using a bone density scan.  If you are 12 years of age or older, or if you are at risk for osteoporosis and fractures, ask your health care provider if you should be screened.  Ask your health care provider whether you should take a calcium or vitamin D supplement to lower your risk for osteoporosis.  Menopause may have certain physical symptoms and risks.  Hormone replacement therapy may reduce some of these symptoms and risks. Talk to your health care provider about whether hormone replacement therapy is right for you.  HOME CARE INSTRUCTIONS   Schedule regular health, dental, and eye exams.  Stay current with your immunizations.   Do not use any tobacco products including cigarettes, chewing tobacco, or electronic cigarettes.  If you are pregnant, do not drink alcohol.  If you are breastfeeding, limit how much and  how often you drink alcohol.  Limit alcohol intake to no more than 1 drink per day for nonpregnant women. One drink equals 12 ounces of beer, 5 ounces of wine, or 1 ounces of hard liquor.  Do not use street drugs.  Do not share needles.  Ask your health care provider for help if you need support or information about quitting drugs.  Tell your health care provider if you often feel depressed.  Tell your health care provider if you have ever been abused or do not feel safe at home.   This information is not intended to replace advice given to you by your health care provider. Make sure you discuss any questions you have with your health care provider.   Document Released: 11/02/2010 Document Revised: 05/10/2014 Document Reviewed: 03/21/2013 Elsevier Interactive Patient Education [employer].

## 2016-03-11 ENCOUNTER — Ambulatory Visit (HOSPITAL_COMMUNITY): Payer: Self-pay | Admitting: Psychiatry

## 2016-03-12 ENCOUNTER — Other Ambulatory Visit: Payer: Self-pay | Admitting: Family

## 2016-03-16 ENCOUNTER — Other Ambulatory Visit: Payer: Self-pay

## 2016-03-16 ENCOUNTER — Encounter: Payer: Self-pay | Admitting: Physical Therapy

## 2016-03-16 ENCOUNTER — Ambulatory Visit: Payer: Medicare Other | Attending: Family | Admitting: Physical Therapy

## 2016-03-16 DIAGNOSIS — R2681 Unsteadiness on feet: Secondary | ICD-10-CM | POA: Diagnosis not present

## 2016-03-16 DIAGNOSIS — M6281 Muscle weakness (generalized): Secondary | ICD-10-CM | POA: Insufficient documentation

## 2016-03-16 NOTE — Therapy (Signed)
[facility] [place], [place], [address] Phone: [phone]   Fax:  [phone]  Physical Therapy Evaluation  Patient Details  Name: Alexandria Chen MRN: [id number] Date of Birth: 1935/02/16 Referring Provider: Evelina Dun, FNP  Encounter Date: 03/16/2016      PT End of Session - 03/16/16 1357    Visit Number 1   Number of Visits 12   Date for PT Re-Evaluation 04/27/16   PT Start Time 1402   PT Stop Time 1447   PT Time Calculation (min) 45 min   Activity Tolerance Patient tolerated treatment well   Behavior During Therapy Anxious      Past Medical History:  Diagnosis Date  . Alcohol abuse, in remission   . Anxiety   . Depression   . HTN (hypertension)   . Osteopenia     History reviewed. No pertinent surgical history.  There were no vitals filed for this visit.       Subjective Assessment - 03/16/16 1544    Subjective Patient presents to clinic with her daughter with complaints of stumbling with gait. She has had two falls in the past six months and multiple near falls. Patient lives independently across the street from her daughter. Her daughter also reports that her mother has fallen out of bed a few times.   Patient is accompained by: Family member  daughter   Pertinent History anxiety, HTN, osteopenia   Patient Stated Goals to walk better   Currently in Pain? No/denies  reports catch in R hip intermittently            [facility] PT Assessment - 03/16/16 0001      Assessment   Medical Diagnosis general unsteadiness   Referring Provider Evelina Dun, FNP   Onset Date/Surgical Date 09/01/15     Precautions   Precautions Fall   Precaution Comments FALL RISK     Balance Screen   Has the patient fallen in the past 6 months Yes   How many times? 2   Has the patient had a decrease in activity level because of a fear of falling?  No   Is the patient reluctant to leave their home because of a fear of  falling?  No     Home Environment   Living Environment Private residence   Living Arrangements Alone  daughter lives across the street   Type of [place] to enter   Entrance Stairs-Number of Steps 3   [place] One level     Prior Function   Level of Independence Independent     Posture/Postural Control   Posture Comments R genu valgus; mild flexion of hips in standIng     ROM / Strength   AROM / PROM / Strength Strength     Strength   Overall Strength Comments ankle DF 5/5; L Ever/Inv 5/5   Strength Assessment Site Hip;Knee   Right/Left Hip Right;Left   Right Hip Flexion 4/5   Right Hip Extension 5/5   Right Hip ABduction 5/5   Right Hip ADduction 5/5   Left Hip Flexion 5/5   Left Hip Extension 5/5   Left Hip ABduction 5/5   Right/Left Knee Right;Left   Right Knee Flexion 5/5   Right Knee Extension 5/5   Left Knee Flexion 5/5   Left Knee Extension 5/5     Ambulation/Gait   Ambulation/Gait Yes   Ambulation/Gait Assistance 7: Independent   Ambulation Distance (Feet)  40 Feet   Gait Pattern Step-through pattern;Decreased stance time - right;Decreased dorsiflexion - right;Decreased dorsiflexion - left   Gait Comments daughter reports patient amb with decreased step/stride length and without heel strike at times      Standardized Balance Assessment   Standardized Balance Assessment Berg Balance Test     Berg Balance Test   Sit to Stand Able to stand  independently using hands   Standing Unsupported Able to stand safely 2 minutes   Sitting with Back Unsupported but Feet Supported on Floor or Stool Able to sit safely and securely 2 minutes   Stand to Sit Sits safely with minimal use of hands   Transfers Able to transfer safely, minor use of hands   Standing Unsupported with Eyes Closed Able to stand 10 seconds safely   Standing Ubsupported with Feet Together Able to place feet together independently and stand 1 minute safely   From Standing,  Reach Forward with Outstretched Arm Can reach forward >12 cm safely (5")   From Standing Position, Pick up Object from Floor Able to pick up shoe safely and easily   From Standing Position, Turn to Look Behind Over each Shoulder Needs assist to keep from losing balance and falling   Turn 360 Degrees Able to turn 360 degrees safely in 4 seconds or less  to R   Standing Unsupported, Alternately Place Feet on Step/Stool Able to stand independently and complete 8 steps >20 seconds   Standing Unsupported, One Foot in Front Needs help to step but can hold 15 seconds  R side weaker   Standing on One Leg Tries to lift leg/unable to hold 3 seconds but remains standing independently   Total Score 43   Berg comment: patient demos functional R hip weakness; LLE is able to perfom at a higher level than R                           PT Education - 03/16/16 1552    Education provided Yes   Education Details Fall prevention strategies including removing throw rugs, night lights and avoiding slippers. Breathing technique for calming anxiety; SLS with daughter at [employer]) Educated Patient;Child(ren)   Methods Explanation;Demonstration;Handout   Comprehension Verbalized understanding             PT Long Term Goals - 03/16/16 1600      PT LONG TERM GOAL #1   Title I with HEP   Time 2   Period Weeks   Status New     PT LONG TERM GOAL #2   Title Improved BERG to 50/56 or better   Time 6   Period Weeks   Status New     PT LONG TERM GOAL #3   Title Patient able to ascend/descend stairs with a reciprocal gait and one UE suport without functional weakness present.   Time 6   Period Weeks   Status New     PT LONG TERM GOAL #4   Title Patient able to ambulate safely in clinic without gait deviations.   Time 6   Period Weeks   Status New               Plan - 03/16/16 1553    Clinical Impression Statement Patient presents with functional weakness of  RLE affecting gait and balance. She scored 43/56 on the BERG and demos LOB with activites requiring RLE stabilization. Her MMT are strong B. She  has some hip tightness, but this may have been more related to the patient's anxiety. Her daughter reports she holds her body/muscles tight most of the time.    Rehab Potential Good   PT Frequency 2x / week   PT Duration 6 weeks   PT Treatment/Interventions ADLs/Self Care Home Management;Gait training;Stair training;Therapeutic exercise;Therapeutic activities;Balance training;Neuromuscular re-education;Patient/family education;Manual techniques   PT Next Visit Plan functional balance activities (see BERG deficits); steps; progress HEP which daughter will assist with.   PT Home Exercise Plan SLS at countertop, breathing technique (diaphragmatic) for relaxation   Consulted and Agree with Plan of Care Patient      Patient will benefit from skilled therapeutic intervention in order to improve the following deficits and impairments:  Abnormal gait, Decreased balance, Decreased strength  Visit Diagnosis: Unsteadiness on feet - Plan: PT plan of care cert/re-cert  Muscle weakness (generalized) - Plan: PT plan of care cert/re-cert      G-Codes - 92/11/94 1604    Functional Assessment Tool Used BERG 43/56   Functional Limitation Mobility: Walking and moving around   Mobility: Walking and Moving Around Current Status [phone]) At least 20 percent but less than 40 percent impaired, limited or restricted   Mobility: Walking and Moving Around Goal Status [phone]) At least 1 percent but less than 20 percent impaired, limited or restricted       Problem List Patient Active Problem List   Diagnosis Date Noted  . Syncope and collapse 04/14/2015  . Otitis externa of right ear 12/18/2014  . Hearing loss due to cerumen impaction 12/18/2014  . Essential hypertension, benign 01/14/2014  . Hyperlipidemia 01/14/2014  . Vitamin D deficiency 01/14/2014  . Alcohol  dependence in remission ([place]) 10/15/2011    Class: Acute  . Depression 08/31/2011  . Anxiety 08/31/2011    Madelyn Flavors PT 03/16/2016, 4:07 PM  [facility] [place], [place], [address] Phone: [phone]   Fax:  [phone]  Name: Alexandria Chen MRN: [id number] Date of Birth: [birth year]

## 2016-03-16 NOTE — Patient Instructions (Signed)
Lower abdominal/core stability exercises  1. Practice your breathing technique: Inhale through your nose expanding your belly and rib cage. Try not to breathe into your chest. Exhale slowly and gradually out your mouth feeling a sense of softness to your body. Practice multiple times. This can be performed unlimited.  Madelyn Flavors, PT 03/16/16 2:46 PM [facility] [place], [place], [address] Phone: [phone]   Fax:  [phone]

## 2016-03-22 ENCOUNTER — Encounter: Payer: Self-pay | Admitting: Physical Therapy

## 2016-03-22 ENCOUNTER — Ambulatory Visit: Payer: Medicare Other | Admitting: Physical Therapy

## 2016-03-22 DIAGNOSIS — M6281 Muscle weakness (generalized): Secondary | ICD-10-CM

## 2016-03-22 DIAGNOSIS — R2681 Unsteadiness on feet: Secondary | ICD-10-CM

## 2016-03-22 NOTE — Therapy (Signed)
[facility] [place], [place], [address] Phone: [phone]   Fax:  [phone]  Physical Therapy Treatment  Patient Details  Name: Alexandria Chen MRN: [id number] Date of Birth: 10/26/1934 Referring Provider: Evelina Dun, FNP  Encounter Date: 03/22/2016      PT End of Session - 03/22/16 1436    Visit Number 2   Number of Visits 12   Date for PT Re-Evaluation 04/27/16   PT Start Time 3382   PT Stop Time 1514   PT Time Calculation (min) 41 min   Activity Tolerance Patient tolerated treatment well   Behavior During Therapy [facility] for tasks assessed/performed      Past Medical History:  Diagnosis Date  . Alcohol abuse, in remission   . Anxiety   . Depression   . HTN (hypertension)   . Osteopenia     No past surgical history on file.  There were no vitals filed for this visit.      Subjective Assessment - 03/22/16 1435    Subjective Patient reports that she stumbles a lot. Reports aches and pains and soreness in her back.   Pertinent History anxiety, HTN, osteopenia   Patient Stated Goals to walk better   Currently in Pain? Yes   Pain Location Back   Pain Descriptors / Indicators Aching;Discomfort;Sore            [facility] PT Assessment - 03/22/16 0001      Assessment   Medical Diagnosis general unsteadiness   Onset Date/Surgical Date 09/01/15     Precautions   Precautions Fall   Precaution Comments FALL RISK                     [facility] Adult PT Treatment/Exercise - 03/22/16 0001      Exercises   Exercises Knee/Hip     Knee/Hip Exercises: Aerobic   Nustep L4 x15 min     Knee/Hip Exercises: Standing   Forward Step Up Both;1 set;15 reps;Hand Hold: 2;Step Height: 6"     Knee/Hip Exercises: Seated   Sit to Sand 20 reps;without UE support             Balance Exercises - 03/22/16 1523      Balance Exercises: Standing   Standing Eyes Closed Narrow base of support (BOS);Wide  (BOA);Foam/compliant surface  x3 min each with 1 UE support   Tandem Stance Eyes open;Upper extremity support 1  x2 min on floor   Heel Raises Limitations x20 reps on floor   Toe Raise Limitations x20 reps on floor                PT Long Term Goals - 03/16/16 1600      PT LONG TERM GOAL #1   Title I with HEP   Time 2   Period Weeks   Status New     PT LONG TERM GOAL #2   Title Improved BERG to 50/56 or better   Time 6   Period Weeks   Status New     PT LONG TERM GOAL #3   Title Patient able to ascend/descend stairs with a reciprocal gait and one UE suport without functional weakness present.   Time 6   Period Weeks   Status New     PT LONG TERM GOAL #4   Title Patient able to ambulate safely in clinic without gait deviations.   Time 6   Period Weeks   Status New  Plan - 03/22/16 1518    Clinical Impression Statement Patient tolerated today's treatment fairly well with no reports of pain or discomfort with any exercises. Patient introduced to functional strengthening and balance activities with moderate multimodal cueing for proper technique. Patient reported after eyes closed with narrow BOS that she was "swimmy headed" but refused a seated rest break. Patient requested seated rest break following coughing episode during balance activties. Patient encouraged to consider use of AD such as cane that she states she has multiple of at her home.   Rehab Potential Good   PT Frequency 2x / week   PT Duration 6 weeks   PT Treatment/Interventions ADLs/Self Care Home Management;Gait training;Stair training;Therapeutic exercise;Therapeutic activities;Balance training;Neuromuscular re-education;Patient/family education;Manual techniques   PT Next Visit Plan functional balance activities (see BERG deficits); steps; progress HEP which daughter will assist with.   PT Home Exercise Plan SLS at countertop, breathing technique (diaphragmatic) for relaxation    Consulted and Agree with Plan of Care Patient      Patient will benefit from skilled therapeutic intervention in order to improve the following deficits and impairments:  Abnormal gait, Decreased balance, Decreased strength  Visit Diagnosis: Unsteadiness on feet  Muscle weakness (generalized)     Problem List Patient Active Problem List   Diagnosis Date Noted  . Syncope and collapse 04/14/2015  . Otitis externa of right ear 12/18/2014  . Hearing loss due to cerumen impaction 12/18/2014  . Essential hypertension, benign 01/14/2014  . Hyperlipidemia 01/14/2014  . Vitamin D deficiency 01/14/2014  . Alcohol dependence in remission ([place]) 10/15/2011    Class: Acute  . Depression 08/31/2011  . Anxiety 08/31/2011    Wynelle Fanny, PTA 03/22/2016, 3:25 PM  [facility] [address] [place], [place], [address] Phone: [phone]   Fax:  [phone]  Name: Alexandria Chen MRN: [id number] Date of Birth: 07/03/1934

## 2016-03-29 ENCOUNTER — Ambulatory Visit: Payer: Medicare Other | Admitting: Physical Therapy

## 2016-03-29 ENCOUNTER — Encounter: Payer: Self-pay | Admitting: Physical Therapy

## 2016-03-29 DIAGNOSIS — R2681 Unsteadiness on feet: Secondary | ICD-10-CM

## 2016-03-29 DIAGNOSIS — M6281 Muscle weakness (generalized): Secondary | ICD-10-CM | POA: Diagnosis not present

## 2016-03-29 NOTE — Therapy (Signed)
[facility] [place], [place], [address] Phone: [phone]   Fax:  [phone]  Physical Therapy Treatment  Patient Details  Name: Alexandria Chen MRN: [id number] Date of Birth: April 27, 1935 Referring Provider: Evelina Dun, FNP  Encounter Date: 03/29/2016      PT End of Session - 03/29/16 1458    Visit Number 3   Number of Visits 12   Date for PT Re-Evaluation 04/27/16   PT Start Time 5284   PT Stop Time 1524   PT Time Calculation (min) 41 min   Activity Tolerance Patient tolerated treatment well   Behavior During Therapy [facility] for tasks assessed/performed      Past Medical History:  Diagnosis Date  . Alcohol abuse, in remission   . Anxiety   . Depression   . HTN (hypertension)   . Osteopenia     History reviewed. No pertinent surgical history.  There were no vitals filed for this visit.      Subjective Assessment - 03/29/16 1451    Subjective Patient reported doing ok overal, no falls reported and no complaints after last treatment   Patient is accompained by: Family member   Pertinent History anxiety, HTN, osteopenia   Patient Stated Goals to walk better   Currently in Pain? No/denies                         [facility] Adult PT Treatment/Exercise - 03/29/16 0001      Knee/Hip Exercises: Aerobic   Nustep L4 x15 min UE/LE activity, monitored for progression             Balance Exercises - 03/29/16 1459      Balance Exercises: Standing   Standing Eyes Opened Wide (BOA);Foam/compliant surface  43mn   Tandem Stance Eyes open;Intermittent upper extremity support   Standing, One Foot on a Step Eyes open;6 inch;4 reps;20 secs   Rockerboard Anterior/posterior  276m   Retro Gait 5 reps  int UE support in parallel bars   Sidestepping 5 reps;Upper extremity support  in parallel bars both directions   Heel Raises Limitations x20 reps on floor   Toe Raise Limitations x20 reps on floor      Balance Exercises: Supine   Other Supine Exercises bridges x10                PT Long Term Goals - 03/29/16 1525      PT LONG TERM GOAL #1   Title I with HEP   Time 2   Period Weeks   Status On-going     PT LONG TERM GOAL #2   Title Improved BERG to 50/56 or better   Time 6   Period Weeks   Status On-going     PT LONG TERM GOAL #3   Title Patient able to ascend/descend stairs with a reciprocal gait and one UE suport without functional weakness present.   Period Weeks   Status On-going     PT LONG TERM GOAL #4   Title Patient able to ambulate safely in clinic without gait deviations.   Time 6   Period Weeks   Status On-going               Plan - 03/29/16 1518    Clinical Impression Statement Patient tolerated treatment well today. Patient had minimal LOB during balance activities and was able to correct independently with CGA/SBA throughtout. Patient required cues for technique. Educated patient on  falling prevention and use of assistive device for safety. Patient goals ongoing due to balance deficts.   Rehab Potential Good   PT Frequency 2x / week   PT Duration 6 weeks   PT Treatment/Interventions ADLs/Self Care Home Management;Gait training;Stair training;Therapeutic exercise;Therapeutic activities;Balance training;Neuromuscular re-education;Patient/family education;Manual techniques   PT Next Visit Plan functional balance activities (see BERG deficits); steps; progress HEP which daughter will assist with.   Consulted and Agree with Plan of Care Patient      Patient will benefit from skilled therapeutic intervention in order to improve the following deficits and impairments:  Abnormal gait, Decreased balance, Decreased strength  Visit Diagnosis: Unsteadiness on feet  Muscle weakness (generalized)     Problem List Patient Active Problem List   Diagnosis Date Noted  . Syncope and collapse 04/14/2015  . Otitis externa of right ear 12/18/2014   . Hearing loss due to cerumen impaction 12/18/2014  . Essential hypertension, benign 01/14/2014  . Hyperlipidemia 01/14/2014  . Vitamin D deficiency 01/14/2014  . Alcohol dependence in remission ([place]) 10/15/2011    Class: Acute  . Depression 08/31/2011  . Anxiety 08/31/2011    Johniece Hornbaker P, PTA 03/29/2016, 3:26 PM  [facility] [place], [place], [address] Phone: [phone]   Fax:  [phone]  Name: Alexandria Chen MRN: [id number] Date of Birth: [birth year]

## 2016-04-07 ENCOUNTER — Ambulatory Visit: Payer: Medicare Other | Attending: Family | Admitting: Physical Therapy

## 2016-04-07 ENCOUNTER — Encounter: Payer: Self-pay | Admitting: Physical Therapy

## 2016-04-07 DIAGNOSIS — R2681 Unsteadiness on feet: Secondary | ICD-10-CM | POA: Insufficient documentation

## 2016-04-07 DIAGNOSIS — M6281 Muscle weakness (generalized): Secondary | ICD-10-CM | POA: Diagnosis not present

## 2016-04-07 NOTE — Therapy (Signed)
[facility] [place], [place], [address] Phone: [phone]   Fax:  [phone]  Physical Therapy Treatment  Patient Details  Name: Alexandria Chen MRN: [id number] Date of Birth: 04/10/1935 Referring Provider: Evelina Dun, FNP  Encounter Date: 04/07/2016      PT End of Session - 04/07/16 1435    Visit Number 4   Number of Visits 12   Date for PT Re-Evaluation 04/27/16   PT Start Time 4268   PT Stop Time 1518   PT Time Calculation (min) 45 min   Activity Tolerance Patient tolerated treatment well   Behavior During Therapy [facility] for tasks assessed/performed      Past Medical History:  Diagnosis Date  . Alcohol abuse, in remission   . Anxiety   . Depression   . HTN (hypertension)   . Osteopenia     No past surgical history on file.  There were no vitals filed for this visit.      Subjective Assessment - 04/07/16 1437    Subjective Reports that her back is hurting her today. Reports that she has trouble when getting up.   Pertinent History anxiety, HTN, osteopenia   Patient Stated Goals to walk better   Currently in Pain? Other (Comment)  LBP but no description or rating provided by patient            [facility] PT Assessment - 04/07/16 0001      Assessment   Medical Diagnosis general unsteadiness   Onset Date/Surgical Date 09/01/15     Precautions   Precautions Fall   Precaution Comments FALL RISK                     [facility] Adult PT Treatment/Exercise - 04/07/16 0001      Knee/Hip Exercises: Aerobic   Nustep L4 x17 min UE/LE activity, monitored for progression     Knee/Hip Exercises: Seated   Long Arc Quad Strengthening;Both;3 sets;10 reps   Long Arc Quad Weight 3 lbs.   Sit to Sand 20 reps;without UE support             Balance Exercises - 04/07/16 1519      Balance Exercises: Standing   Standing Eyes Closed Narrow base of support (BOS);Foam/compliant surface  x3 min  intermittant UE support   Tandem Stance Eyes open;Foam/compliant surface;Intermittent upper extremity support  x3 min   Tandem Gait Forward;Intermittent upper extremity support;Foam/compliant surface  x4 reps   Sidestepping Foam/compliant support;Upper extremity support;4 reps   Marching Limitations x2 min UE support at various speeds   Other Standing Exercises Reaching shoulder height and head height on airex BUE x20 reps each                PT Long Term Goals - 03/29/16 1525      PT LONG TERM GOAL #1   Title I with HEP   Time 2   Period Weeks   Status On-going     PT LONG TERM GOAL #2   Title Improved BERG to 50/56 or better   Time 6   Period Weeks   Status On-going     PT LONG TERM GOAL #3   Title Patient able to ascend/descend stairs with a reciprocal gait and one UE suport without functional weakness present.   Period Weeks   Status On-going     PT LONG TERM GOAL #4   Title Patient able to ambulate safely in clinic without gait deviations.  Time 6   Period Weeks   Status On-going               Plan - 04/07/16 1525    Clinical Impression Statement Patient tolerated treatment well today even as patient arrived with reports of low back pain today. Patient able to successfully complete sit to stands without UE support although VCs needed for erect stance to stabilize. Patient required CGA with balance activities today with uneven surface. Goals remain on-going secondary to balance deficits. Patient educated to stand slowly and not to rush standing to prevent unsteadiness prior to walking. Patient educated to remain standing to regain balance prior to walking with patient verbalizing understanding of education.   Rehab Potential Good   PT Frequency 2x / week   PT Duration 6 weeks   PT Treatment/Interventions ADLs/Self Care Home Management;Gait training;Stair training;Therapeutic exercise;Therapeutic activities;Balance training;Neuromuscular  re-education;Patient/family education;Manual techniques   PT Next Visit Plan functional balance activities (see BERG deficits); steps; progress HEP which daughter will assist with.   PT Home Exercise Plan SLS at countertop, breathing technique (diaphragmatic) for relaxation   Consulted and Agree with Plan of Care Patient      Patient will benefit from skilled therapeutic intervention in order to improve the following deficits and impairments:  Abnormal gait, Decreased balance, Decreased strength  Visit Diagnosis: Unsteadiness on feet  Muscle weakness (generalized)     Problem List Patient Active Problem List   Diagnosis Date Noted  . Syncope and collapse 04/14/2015  . Otitis externa of right ear 12/18/2014  . Hearing loss due to cerumen impaction 12/18/2014  . Essential hypertension, benign 01/14/2014  . Hyperlipidemia 01/14/2014  . Vitamin D deficiency 01/14/2014  . Alcohol dependence in remission ([place]) 10/15/2011    Class: Acute  . Depression 08/31/2011  . Anxiety 08/31/2011    Wynelle Fanny, PTA 04/07/2016, 3:35 PM  [facility] [address] [place], [place], [address] Phone: [phone]   Fax:  [phone]  Name: Alexandria Chen MRN: [id number] Date of Birth: [birth year]

## 2016-04-15 ENCOUNTER — Ambulatory Visit: Payer: Medicare Other | Admitting: Physical Therapy

## 2016-04-15 DIAGNOSIS — R2681 Unsteadiness on feet: Secondary | ICD-10-CM

## 2016-04-15 DIAGNOSIS — M6281 Muscle weakness (generalized): Secondary | ICD-10-CM | POA: Diagnosis not present

## 2016-04-15 NOTE — Therapy (Signed)
[facility] [place], [place], [address] Phone: [phone]   Fax:  [phone]  Physical Therapy Treatment  Patient Details  Name: LISET MCMONIGLE MRN: [id number] Date of Birth: Dec 09, 1934 Referring Provider: Evelina Dun, FNP  Encounter Date: 04/15/2016      PT End of Session - 04/15/16 1619    Visit Number 5   Number of Visits 12   Date for PT Re-Evaluation 04/27/16   PT Start Time 0230   PT Stop Time 0314   PT Time Calculation (min) 44 min   Activity Tolerance Patient tolerated treatment well   Behavior During Therapy [facility] for tasks assessed/performed      Past Medical History:  Diagnosis Date  . Alcohol abuse, in remission   . Anxiety   . Depression   . HTN (hypertension)   . Osteopenia     No past surgical history on file.  There were no vitals filed for this visit.      Subjective Assessment - 04/15/16 1619    Subjective I'm doing pretty good.     Treatment:  Nustep level 4 x 17 minutes; knee extension with 10# and ham curls with 30# (total 5 minutes); inverted BOSU ball and rockerboard in parallel bars (11 minutes) resisted green XTS walking forward and backward (5 minutes).                                 PT Long Term Goals - 03/29/16 1525      PT LONG TERM GOAL #1   Title I with HEP   Time 2   Period Weeks   Status On-going     PT LONG TERM GOAL #2   Title Improved BERG to 50/56 or better   Time 6   Period Weeks   Status On-going     PT LONG TERM GOAL #3   Title Patient able to ascend/descend stairs with a reciprocal gait and one UE suport without functional weakness present.   Period Weeks   Status On-going     PT LONG TERM GOAL #4   Title Patient able to ambulate safely in clinic without gait deviations.   Time 6   Period Weeks   Status On-going             Patient will benefit from skilled therapeutic intervention in order to improve the  following deficits and impairments:  Abnormal gait, Decreased balance, Decreased strength  Visit Diagnosis: Unsteadiness on feet  Muscle weakness (generalized)     Problem List Patient Active Problem List   Diagnosis Date Noted  . Syncope and collapse 04/14/2015  . Otitis externa of right ear 12/18/2014  . Hearing loss due to cerumen impaction 12/18/2014  . Essential hypertension, benign 01/14/2014  . Hyperlipidemia 01/14/2014  . Vitamin D deficiency 01/14/2014  . Alcohol dependence in remission ([place]) 10/15/2011    Class: Acute  . Depression 08/31/2011  . Anxiety 08/31/2011    Patrizia Paule, [place] MPT 04/15/2016, 4:22 PM  [facility] [address] [place], [place], [address] Phone: [phone]   Fax:  [phone]  Name: TYTIONNA CLOYD MRN: [id number] Date of Birth: 07-Feb-1935

## 2016-04-21 ENCOUNTER — Ambulatory Visit: Payer: Medicare Other | Admitting: Physical Therapy

## 2016-04-28 ENCOUNTER — Ambulatory Visit: Payer: Medicare Other | Admitting: Physical Therapy

## 2016-04-28 ENCOUNTER — Encounter: Payer: Self-pay | Admitting: Physical Therapy

## 2016-04-28 DIAGNOSIS — M6281 Muscle weakness (generalized): Secondary | ICD-10-CM | POA: Diagnosis not present

## 2016-04-28 DIAGNOSIS — R2681 Unsteadiness on feet: Secondary | ICD-10-CM

## 2016-04-28 NOTE — Therapy (Signed)
[facility] [place], [place], [address] Phone: [phone]   Fax:  [phone]  Physical Therapy Treatment  Patient Details  Name: Alexandria Chen MRN: [id number] Date of Birth: 19-Jun-1934 Referring Provider: Evelina Dun, FNP  Encounter Date: 04/28/2016      PT End of Session - 04/28/16 1433    Visit Number 6   Number of Visits 12   Date for PT Re-Evaluation 04/27/16   PT Start Time 6962   PT Stop Time 1512   PT Time Calculation (min) 41 min   Activity Tolerance Patient tolerated treatment well   Behavior During Therapy [facility] for tasks assessed/performed      Past Medical History:  Diagnosis Date  . Alcohol abuse, in remission   . Anxiety   . Depression   . HTN (hypertension)   . Osteopenia     No past surgical history on file.  There were no vitals filed for this visit.      Subjective Assessment - 04/28/16 1433    Subjective Reports that her back is bothering her today.   Pertinent History anxiety, HTN, osteopenia   Patient Stated Goals to walk better   Currently in Pain? Other (Comment)  Unable to assess back pain upon questioning of the pain.            [facility] PT Assessment - 04/28/16 0001      Assessment   Medical Diagnosis general unsteadiness   Onset Date/Surgical Date 09/01/15     Precautions   Precautions Fall   Precaution Comments FALL RISK                     [facility] Adult PT Treatment/Exercise - 04/28/16 0001      Ambulation/Gait   Ambulation/Gait --   Stairs Yes   Stairs Assistance 6: Modified independent (Device/Increase time)   Stair Management Technique One rail Right;Alternating pattern;Step to pattern;Forwards   Number of Stairs 4  x4 reps   Height of Stairs 6.5     Knee/Hip Exercises: Aerobic   Nustep L5 X15 MIN     Knee/Hip Exercises: Seated   Sit to Sand 20 reps;without UE support     Knee/Hip Exercises: Supine   Bridges Limitations 2x10 reps   Straight Leg Raises Strengthening;Both;2 sets;10 reps             Balance Exercises - 04/28/16 1517      Balance Exercises: Standing   Standing Eyes Opened Narrow base of support (BOS);Other (comment)  x3 min on BOSU with 2 UE support and close supervision   Marching Limitations x2 min UE support at various speeds on airex   Heel Raises Limitations x20 reps on floor   Toe Raise Limitations x20 reps on floor   Other Standing Exercises 2D resisted walking with pink XTS x8 reps each                PT Long Term Goals - 03/29/16 1525      PT LONG TERM GOAL #1   Title I with HEP   Time 2   Period Weeks   Status On-going     PT LONG TERM GOAL #2   Title Improved BERG to 50/56 or better   Time 6   Period Weeks   Status On-going     PT LONG TERM GOAL #3   Title Patient able to ascend/descend stairs with a reciprocal gait and one UE suport without functional weakness present.  Period Weeks   Status On-going     PT LONG TERM GOAL #4   Title Patient able to ambulate safely in clinic without gait deviations.   Time 6   Period Weeks   Status On-going               Plan - 04/28/16 1518    Clinical Impression Statement Patient tolerated treatment fair today as she arrived with reports of back pain that she was unable to assess for pain. Patient required max multimodal cueing for proper bridge technique and SLR technique. Patient's LE strength increasing slowly as observed today. Patient able to ambulate stairs reciprically with demonstration from PTA as well as VCs with one UE support. Patient demonstrated instability with BOSU exercise and required 2 full hand support and demonstrated instability with resisted walking. All exercises today were completed with CGA or close supervision. Patient required CGA and VCs to increase foot clearance with resisted walking. Patient educated to be very careful around throw rugs that she has not removed at home yet.   Rehab  Potential Good   PT Frequency 2x / week   PT Duration 6 weeks   PT Treatment/Interventions ADLs/Self Care Home Management;Gait training;Stair training;Therapeutic exercise;Therapeutic activities;Balance training;Neuromuscular re-education;Patient/family education;Manual techniques   PT Next Visit Plan functional balance activities (see BERG deficits); steps; progress HEP which daughter will assist with.   PT Home Exercise Plan SLS at countertop, breathing technique (diaphragmatic) for relaxation   Consulted and Agree with Plan of Care Patient      Patient will benefit from skilled therapeutic intervention in order to improve the following deficits and impairments:  Abnormal gait, Decreased balance, Decreased strength  Visit Diagnosis: Unsteadiness on feet  Muscle weakness (generalized)     Problem List Patient Active Problem List   Diagnosis Date Noted  . Syncope and collapse 04/14/2015  . Otitis externa of right ear 12/18/2014  . Hearing loss due to cerumen impaction 12/18/2014  . Essential hypertension, benign 01/14/2014  . Hyperlipidemia 01/14/2014  . Vitamin D deficiency 01/14/2014  . Alcohol dependence in remission ([place] [place]) 10/15/2011    Class: Acute  . Depression 08/31/2011  . Anxiety 08/31/2011    Wynelle Fanny, PTA 04/28/2016, 3:28 PM  [facility] [address] [place], [place], [address] Phone: [phone]   Fax:  [phone]  Name: Alexandria Chen MRN: [id number] Date of Birth: November 14, 1934

## 2016-05-05 ENCOUNTER — Encounter: Payer: Self-pay | Admitting: Physical Therapy

## 2016-05-05 ENCOUNTER — Ambulatory Visit: Payer: Medicare Other | Attending: Family | Admitting: Physical Therapy

## 2016-05-05 DIAGNOSIS — M6281 Muscle weakness (generalized): Secondary | ICD-10-CM | POA: Insufficient documentation

## 2016-05-05 DIAGNOSIS — R2681 Unsteadiness on feet: Secondary | ICD-10-CM | POA: Insufficient documentation

## 2016-05-05 NOTE — Therapy (Signed)
[facility] [place], [place], [address] Phone: [phone]   Fax:  [phone]  Physical Therapy Treatment  Patient Details  Name: Alexandria Chen MRN: [id number] Date of Birth: 1934-12-10 Referring Provider: Evelina Dun, FNP  Encounter Date: 05/05/2016      PT End of Session - 05/05/16 1436    Visit Number 7   Number of Visits 12   Date for PT Re-Evaluation 04/27/16   PT Start Time 3532   PT Stop Time 1511   PT Time Calculation (min) 40 min   Activity Tolerance Patient tolerated treatment well   Behavior During Therapy [facility] for tasks assessed/performed      Past Medical History:  Diagnosis Date  . Alcohol abuse, in remission   . Anxiety   . Depression   . HTN (hypertension)   . Osteopenia     History reviewed. No pertinent surgical history.  There were no vitals filed for this visit.      Subjective Assessment - 05/05/16 1434    Subjective Patient had no new complaints today and no falls reported   Pertinent History anxiety, HTN, osteopenia   Patient Stated Goals to walk better   Currently in Pain? No/denies            [facility] PT Assessment - 05/05/16 0001      Berg Balance Test   Sit to Stand Able to stand without using hands and stabilize independently   Standing Unsupported Able to stand safely 2 minutes   Sitting with Back Unsupported but Feet Supported on Floor or Stool Able to sit safely and securely 2 minutes   Stand to Sit Sits safely with minimal use of hands   Transfers Able to transfer safely, minor use of hands   Standing Unsupported with Eyes Closed Able to stand 10 seconds safely   Standing Ubsupported with Feet Together Able to place feet together independently and stand 1 minute safely   From Standing, Reach Forward with Outstretched Arm Can reach forward >12 cm safely (5")   From Standing Position, Pick up Object from Floor Able to pick up shoe safely and easily   From Standing  Position, Turn to Look Behind Over each Shoulder Turn sideways only but maintains balance   Turn 360 Degrees Able to turn 360 degrees safely in 4 seconds or less   Standing Unsupported, Alternately Place Feet on Step/Stool Able to stand independently and complete 8 steps >20 seconds   Standing Unsupported, One Foot in Front Needs help to step but can hold 15 seconds   Standing on One Leg Tries to lift leg/unable to hold 3 seconds but remains standing independently   Total Score 46                     [facility] Adult PT Treatment/Exercise - 05/05/16 0001      Knee/Hip Exercises: Aerobic   Nustep L4 x28mn UE/LE, monitored for progression     Knee/Hip Exercises: Supine   Other Supine Knee/Hip Exercises hip bridge 2x10             Balance Exercises - 05/05/16 1448      Balance Exercises: Standing   Standing Eyes Opened Narrow base of support (BOS);Wide (BOA);Foam/compliant surface  x2 min each   Tandem Stance Eyes open;Intermittent upper extremity support;4 reps   SLS 1 rep;Eyes open  difficulty    Standing, One Foot on a Step Eyes open;6 inch;4 reps;20 secs   Rockerboard  Anterior/posterior  1 min, difficulty   Step Ups 6 inch;UE support 1  2x10   Marching Limitations 2x10   Heel Raises Limitations 2x10   Toe Raise Limitations 2x10   Sit to Stand Time x10   Other Standing Exercises hip abd 2x10           PT Education - 05/05/16 1459    Education Details HEP balance instructed for her daughter to assist   Person(s) Educated Patient   Methods Explanation;Demonstration;Handout   Comprehension Verbalized understanding;Returned demonstration             PT Long Term Goals - 05/05/16 1452      PT LONG TERM GOAL #1   Title I with HEP   Time 2   Period Weeks   Status On-going     PT LONG TERM GOAL #2   Title Improved BERG to 50/56 or better   Period Weeks   Status On-going  46/56 05/05/16     PT LONG TERM GOAL #3   Title Patient able to  ascend/descend stairs with a reciprocal gait and one UE suport without functional weakness present.   Time 6   Period Weeks   Status On-going     PT LONG TERM GOAL #4   Title Patient able to ambulate safely in clinic without gait deviations.   Time 6   Period Weeks   Status On-going               Plan - 05/05/16 1453    Clinical Impression Statement Patient tolerated treatment well today with no complaints yet had difficulty with balance activities and required CGA/SBA throghout. Patient has reported no falls or LOB at home. Patient was able to inprove BERG to 46/56. Patient current goals ongoing due to balance deficts.   Rehab Potential Good   PT Frequency 2x / week   PT Duration 6 weeks   PT Treatment/Interventions ADLs/Self Care Home Management;Gait training;Stair training;Therapeutic exercise;Therapeutic activities;Balance training;Neuromuscular re-education;Patient/family education;Manual techniques   PT Next Visit Plan cont with POC for functional balance activities (see BERG deficits); steps   Consulted and Agree with Plan of Care Patient      Patient will benefit from skilled therapeutic intervention in order to improve the following deficits and impairments:  Abnormal gait, Decreased balance, Decreased strength  Visit Diagnosis: Unsteadiness on feet  Muscle weakness (generalized)     Problem List Patient Active Problem List   Diagnosis Date Noted  . Syncope and collapse 04/14/2015  . Otitis externa of right ear 12/18/2014  . Hearing loss due to cerumen impaction 12/18/2014  . Essential hypertension, benign 01/14/2014  . Hyperlipidemia 01/14/2014  . Vitamin D deficiency 01/14/2014  . Alcohol dependence in remission ([place]) 10/15/2011    Class: Acute  . Depression 08/31/2011  . Anxiety 08/31/2011    Ladean Raya, PTA 05/05/16 3:11 PM  [facility] [place], [place], [address] Phone:  [phone]   Fax:  [phone]  Name: Alexandria Chen MRN: [id number] Date of Birth: [birth year]

## 2016-05-05 NOTE — Patient Instructions (Signed)
  Toe-Up (Ankle Plantar Flexion and Dorsiflexion)   Holding a stable object, rise up on toes. Hold ____ seconds. Then rock back on heels and Hold 2____ seconds. Repeat _5-10___ times. Do _1-2___ sessions per day.  High Stepping   Using support, lift knees, taking high steps. Repeat __5-10__ times. Do __1-2__ sessions per day.   Walk to the Side   Step to the side with stronger leg and follow with involved leg. Then return. Hold chair if necessary. Repeat __5-10__ times. Do _1-2___ sessions per day.   Pelvic Tilt: Posterior - Legs Bent (Supine)  Bridging   Slowly raise buttocks from floor, keeping stomach tight. Repeat _10___ times per set. Do __2__ sets per session. Do __2__ sessions per day.   Half Squat to Chair   Stand with feet shoulder width apart. Push buttocks backward and lower slowly, sitting in chair lightly and returning to standing position. Complete _2_ sets of 10_ repetitions. Perform __2-3_ sessions per day.

## 2016-05-10 ENCOUNTER — Telehealth (HOSPITAL_COMMUNITY): Payer: Self-pay | Admitting: *Deleted

## 2016-05-10 NOTE — Telephone Encounter (Signed)
kim called to sch appt for pt for both providers she see at practice before staff could get back to caller to resch appt for pt, calleer had hung up. staff called [phone] back and lmtcb.

## 2016-05-12 ENCOUNTER — Encounter: Payer: Self-pay | Admitting: Physical Therapy

## 2016-05-12 ENCOUNTER — Ambulatory Visit: Payer: Medicare Other | Admitting: Physical Therapy

## 2016-05-12 DIAGNOSIS — R2681 Unsteadiness on feet: Secondary | ICD-10-CM | POA: Diagnosis not present

## 2016-05-12 DIAGNOSIS — M6281 Muscle weakness (generalized): Secondary | ICD-10-CM | POA: Diagnosis not present

## 2016-05-12 NOTE — Therapy (Signed)
[facility] [place], [place], [address] Phone: [phone]   Fax:  [phone]  Physical Therapy Treatment  Patient Details  Name: Alexandria Chen MRN: [id number] Date of Birth: 29-Oct-1934 Referring Provider: Evelina Dun, FNP  Encounter Date: 05/12/2016      PT End of Session - 05/12/16 1424    Visit Number 8   Number of Visits 12   Date for PT Re-Evaluation 06/26/16   PT Start Time 6962   PT Stop Time 1440   PT Time Calculation (min) 42 min   Activity Tolerance Patient tolerated treatment well   Behavior During Therapy [facility] for tasks assessed/performed      Past Medical History:  Diagnosis Date  . Alcohol abuse, in remission   . Anxiety   . Depression   . HTN (hypertension)   . Osteopenia     History reviewed. No pertinent surgical history.  There were no vitals filed for this visit.      Subjective Assessment - 05/12/16 1419    Subjective Patient had no new complaints today and no falls reported   Pertinent History anxiety, HTN, osteopenia   Patient Stated Goals to walk better   Currently in Pain? No/denies                         [facility] Adult PT Treatment/Exercise - 05/12/16 0001      Knee/Hip Exercises: Aerobic   Nustep L4 x33mn UE/LE, monitored for progression             Balance Exercises - 05/12/16 1421      Balance Exercises: Standing   Standing Eyes Opened Narrow base of support (BOS);Wide (BOA);Foam/compliant surface   Standing Eyes Closed --  253m each   Tandem Stance Eyes open;Intermittent upper extremity support;4 reps   Standing, One Foot on a Step Eyes open;6 inch;4 reps;20 secs   Rockerboard Anterior/posterior   Step Ups 6 inch;UE support 1  2x10   Marching Limitations 2x10   Heel Raises Limitations 2x10   Toe Raise Limitations 2x10   Sit to Stand Time x10   Other Standing Exercises hip abd 2x10                PT Long Term Goals - 05/05/16  1452      PT LONG TERM GOAL #1   Title I with HEP   Time 2   Period Weeks   Status On-going     PT LONG TERM GOAL #2   Title Improved BERG to 50/56 or better   Period Weeks   Status On-going  46/56 05/05/16     PT LONG TERM GOAL #3   Title Patient able to ascend/descend stairs with a reciprocal gait and one UE suport without functional weakness present.   Time 6   Period Weeks   Status On-going     PT LONG TERM GOAL #4   Title Patient able to ambulate safely in clinic without gait deviations.   Time 6   Period Weeks   Status On-going               Plan - 05/12/16 1426    Clinical Impression Statement Patient tolerated treatment well today. Patient required CGA/SBA throughout for safety. Patient required hand held assist in parellel bars to avoid LOB. Patient has reported no falls. Discussed use of cane with patient, she will bring in next treatment to practice correct use. Patient current goals  ongoing due to balance deficts.   Rehab Potential Good   PT Frequency 2x / week   PT Duration 6 weeks   PT Treatment/Interventions ADLs/Self Care Home Management;Gait training;Stair training;Therapeutic exercise;Therapeutic activities;Balance training;Neuromuscular re-education;Patient/family education;Manual techniques   PT Next Visit Plan cont with POC for functional balance activities (see BERG deficits); steps   Consulted and Agree with Plan of Care Patient      Patient will benefit from skilled therapeutic intervention in order to improve the following deficits and impairments:  Abnormal gait, Decreased balance, Decreased strength  Visit Diagnosis: Unsteadiness on feet  Muscle weakness (generalized)     Problem List Patient Active Problem List   Diagnosis Date Noted  . Syncope and collapse 04/14/2015  . Otitis externa of right ear 12/18/2014  . Hearing loss due to cerumen impaction 12/18/2014  . Essential hypertension, benign 01/14/2014  . Hyperlipidemia  01/14/2014  . Vitamin D deficiency 01/14/2014  . Alcohol dependence in remission ([place]) 10/15/2011    Class: Acute  . Depression 08/31/2011  . Anxiety 08/31/2011    Annia Gomm P, PTA 05/12/2016, 2:42 PM  [facility] [place], [place], [address] Phone: [phone]   Fax:  [phone]  Name: CONCETTA GUION MRN: [id number] Date of Birth: April 07, 1935

## 2016-05-17 ENCOUNTER — Ambulatory Visit (INDEPENDENT_AMBULATORY_CARE_PROVIDER_SITE_OTHER): Payer: Medicare Other | Admitting: Psychiatry

## 2016-05-17 ENCOUNTER — Encounter (HOSPITAL_COMMUNITY): Payer: Self-pay | Admitting: Psychiatry

## 2016-05-17 VITALS — BP 141/89 | HR 77 | Ht 61.5 in | Wt 123.8 lb

## 2016-05-17 DIAGNOSIS — Z8249 Family history of ischemic heart disease and other diseases of the circulatory system: Secondary | ICD-10-CM | POA: Diagnosis not present

## 2016-05-17 DIAGNOSIS — Z8261 Family history of arthritis: Secondary | ICD-10-CM | POA: Diagnosis not present

## 2016-05-17 DIAGNOSIS — Z811 Family history of alcohol abuse and dependence: Secondary | ICD-10-CM | POA: Diagnosis not present

## 2016-05-17 DIAGNOSIS — Z818 Family history of other mental and behavioral disorders: Secondary | ICD-10-CM

## 2016-05-17 DIAGNOSIS — F331 Major depressive disorder, recurrent, moderate: Secondary | ICD-10-CM | POA: Diagnosis not present

## 2016-05-17 MED ORDER — BUSPIRONE HCL 15 MG PO TABS: 15.0000 mg | ORAL_TABLET | Freq: Two times a day (BID) | ORAL | 2 refills | Status: DC

## 2016-05-17 MED ORDER — GABAPENTIN 100 MG PO CAPS: 100.0000 mg | ORAL_CAPSULE | Freq: Every day | ORAL | 2 refills | Status: DC

## 2016-05-17 MED ORDER — DULOXETINE HCL 60 MG PO CPEP
60.0000 mg | ORAL_CAPSULE | Freq: Every day | ORAL | 2 refills | Status: DC
Start: 2016-05-17 — End: 2016-07-18

## 2016-05-17 NOTE — Progress Notes (Signed)
Patient ID: MARTISHA TOULOUSE, female   DOB: Sep 28, 1934, 82 y.o.   MRN: [id number] Patient ID: NAHDIA DOUCET, female   DOB: 12/06/1934, 82 y.o.   MRN: [id number] Patient ID: SEDONIA KITNER, female   DOB: January 09, 1935, 82 y.o.   MRN: [id number] Patient ID: MAURY BAMBA, female   DOB: 10-20-1934, 82 y.o.   MRN: [id number] Patient ID: KARLEE STAFF, female   DOB: 07/15/1934, 82 y.o.   MRN: [id number] Patient ID: LUCY BOARDMAN, female   DOB: March 09, 1935, 82 y.o.   MRN: [id number] Patient ID: ALANII RAMER, female   DOB: 11-20-1934, 82 y.o.   MRN: [id number] Patient ID: NAVIYAH SCHAFFERT, female   DOB: February 04, 1935, 82 y.o.   MRN: [id number] Patient ID: ABRIAL ARRIGHI, female   DOB: [birth year], 82 y.o.   MRN: [id number] Patient ID: ULLA MCKIERNAN, female   DOB: 04-08-1935, 82 y.o.   MRN: [id number] Patient ID: ALYSSIA HEESE, female   DOB: 08-18-1934, 82 y.o.   MRN: [id number] Patient ID: CARY WILFORD, female   DOB: Feb 06, 1935, 82 y.o.   MRN: [id number] [facility] [id number] Progress Note ROISE EMERT MRN: [id number] DOB: [birth year] Age: 82 y.o.  Date: 05/17/2016 Start Time: 10:40 AM End Time: 10:59 AM  Chief Complaint: Chief Complaint  Patient presents with  . Depression  . Anxiety  . Follow-up   Subjective:  "I've been nervous  This patient is a 82 year old widowed black female who lives with her 25 year old grandson in [place]. She is a retired Engineer, materials.  The patient has had a long history of depression anxiety. She's always been an anxious person and thinks she inherited this trait from her mother. She used to abuse alcohol but had stopped for several years until a relapse last year. She was hospitalized in 2013 for alcohol relapse and has not used since. Her 45 year old mother lives across the street and calls her constantly and this makes her nervous. She does state that the addition of Neurontin and BuSpar helped considerably with her anxiety. In general her mood is good, her energy is good and she is sleeping well.  She denies any suicidal ideation  Patient returns after 6 months and her daughter [name] is with her today. Her grandson and his 76-year-old daughter were living with her for a while then moved out and now are back. Her daughter thinks this is too much on her and they end up arguing a lot. The patient seems a bit more withdrawn and depressed and also slightly confused. She states her memory is not as good as it used to be. She is more anxious and irritable. She is having some trouble with balance and is receiving physical therapy and also has some pain in her back. I suggested we change her Prozac to Cymbalta because this may help more with depression and chronic pain  Current psychiatric medication BuSpar 15 mg 3 times a day Neurontin 100 mg at bedtime Prozac 40 mg daily.   BP (!) 141/89 (BP Location: Right Arm, Patient Position: Sitting, Cuff Size: Normal)   Pulse 77   Ht 5' 1.5" (1.562 m)   Wt 123 lb 12.8 oz (56.2 kg)   SpO2 99%   BMI 23.01 kg/m   Past psychiatric history Patient was  admitted to behavioral [facility] in June 2013 due to relapse into her alcohol.  She has significant history of depression and alcohol abuse.  She has at least 2 other psychiatric admission at  behavioral [facility] which are exacerbated by alcohol intake.   Patient had a good response with Prozac and BuSpar.  She was taking benzodiazepine in the past however due to history of alcohol it was discontinued.  Patient denies any history of paranoia or psychosis.  Allergies: No Known Allergies Medical History: Past Medical History:  Diagnosis Date  . Alcohol abuse, in remission   . Anxiety   . Depression   . HTN (hypertension)   . Osteopenia   Hypertension.  Patient sees Dr. Ronnald Collum in [place].    Surgical History: No past surgical history on file. Family History: family history includes Alcohol abuse in her brother and father; Anxiety disorder in her mother; Arthritis in her  father; Dementia in her mother; Hypertension in her mother. Reviewed and no changes noted today.  Psychosocial history Patient lives by herself however his grandson stays sometime with her.  Patient has 4 daughter and one son.  2 of her daughter lives close by.      Alcohol and Substance use history The patient has significant history of drinking alcohol for more than 30 years.  She  has been admitted few times due to relapse into drinking and worsening of depression .  Patient claims to be sober since she released from the hospital.    Mental status examination.  Patient is casually dressed.  She is pleasant cooperative and maintained good eye contact.  Her attention and concentration is  good.  Her speech is clear and coherent.  Her thought process is also logical and goal-directed.  She described her mood as anxious and her affect is congruent  She denies any active or passive suicidal thoughts or homicidal thoughts.  There were no paranoia or delusion present at this time.  There were no tremors although her hands are shaky.  Her attention concentration is good.  She's alert oriented x3.  There were no flight of idea or loose association.  Her insight judgment and pulse control isgood. Fund of knowledge is fair speech is clear and coherent and memory is reported as declining. Her appetite has also been low at home  Lab Results:  Results for orders placed or performed in visit on 02/05/16 (from the past 8736 hour(s))  CMP14+EGFR   Collection Time: 02/05/16  2:41 PM  Result Value Ref Range   Glucose 77 65 - 99 mg/dL   BUN 10 8 - 27 mg/dL   Creatinine, Ser 0.69 0.57 - 1.00 mg/dL   GFR calc non Af Amer 83 >59 mL/min/1.73   GFR calc Af Amer 96 >59 mL/min/1.73   BUN/Creatinine Ratio 14 12 - 28   Sodium 141 134 - 144 mmol/L   Potassium 4.0 3.5 - 5.2 mmol/L   Chloride 101 96 - 106 mmol/L   CO2 24 18 - 29 mmol/L   Calcium 10.4 (H) 8.7 - 10.3 mg/dL   Total Protein 7.5 6.0 - 8.5 g/dL   Albumin  4.4 3.5 - 4.8 g/dL   Globulin, Total 3.1 1.5 - 4.5 g/dL   Albumin/Globulin Ratio 1.4 1.2 - 2.2   Bilirubin Total 0.5 0.0 - 1.2 mg/dL   Alkaline Phosphatase 84 39 - 117 IU/L   AST 18 0 - 40 IU/L   ALT 9 0 - 32 IU/L  Lipid panel   Collection Time: 02/05/16  2:41 PM  Result Value Ref Range   Cholesterol, Total 181 100 - 199 mg/dL   Triglycerides 100 0 - 149 mg/dL   HDL  57 >39 mg/dL   VLDL Cholesterol Cal 20 5 - 40 mg/dL   LDL Calculated 104 (H) 0 - 99 mg/dL   Chol/HDL Ratio 3.2 0.0 - 4.4 ratio units  VITAMIN D 25 Hydroxy (Vit-D Deficiency, Fractures)   Collection Time: 02/05/16  2:41 PM  Result Value Ref Range   Vit D, 25-Hydroxy 33.2 30.0 - 100.0 ng/mL  CBC with Differential/Platelet   Collection Time: 02/05/16  2:41 PM  Result Value Ref Range   WBC 5.7 3.4 - 10.8 x10E3/uL   RBC 4.65 3.77 - 5.28 x10E6/uL   Hemoglobin 14.1 11.1 - 15.9 g/dL   Hematocrit 41.5 34.0 - 46.6 %   MCV 89 79 - 97 fL   MCH 30.3 26.6 - 33.0 pg   MCHC 34.0 31.5 - 35.7 g/dL   RDW 14.8 12.3 - 15.4 %   Platelets 327 150 - 379 x10E3/uL   Neutrophils 52 Not Estab. %   Lymphs 37 Not Estab. %   Monocytes 10 Not Estab. %   Eos 1 Not Estab. %   Basos 0 Not Estab. %   Neutrophils Absolute 2.9 1.4 - 7.0 x10E3/uL   Lymphocytes Absolute 2.1 0.7 - 3.1 x10E3/uL   Monocytes Absolute 0.6 0.1 - 0.9 x10E3/uL   EOS (ABSOLUTE) 0.1 0.0 - 0.4 x10E3/uL   Basophils Absolute 0.0 0.0 - 0.2 x10E3/uL   Immature Granulocytes 0 Not Estab. %   Immature Grans (Abs) 0.0 0.0 - 0.1 x10E3/uL  Thyroid Panel With TSH   Collection Time: 02/05/16  2:41 PM  Result Value Ref Range   TSH 1.010 0.450 - 4.500 uIU/mL   T4, Total 6.9 4.5 - 12.0 ug/dL   T3 Uptake Ratio 25 24 - 39 %   Free Thyroxine Index 1.7 1.2 - 4.9  Results for orders placed or performed during the hospital encounter of 10/19/15 (from the past 8736 hour(s))  CBC with Differential   Collection Time: 10/19/15  2:10 PM  Result Value Ref Range   WBC 6.0 4.0 - 10.5 K/uL   RBC  4.02 3.87 - 5.11 MIL/uL   Hemoglobin 12.1 12.0 - 15.0 g/dL   HCT 35.6 (L) 36.0 - 46.0 %   MCV 88.6 78.0 - 100.0 fL   MCH 30.1 26.0 - 34.0 pg   MCHC 34.0 30.0 - 36.0 g/dL   RDW 14.4 11.5 - 15.5 %   Platelets 281 150 - 400 K/uL   Neutrophils Relative % 62 %   Neutro Abs 3.7 1.7 - 7.7 K/uL   Lymphocytes Relative 29 %   Lymphs Abs 1.8 0.7 - 4.0 K/uL   Monocytes Relative 8 %   Monocytes Absolute 0.5 0.1 - 1.0 K/uL   Eosinophils Relative 1 %   Eosinophils Absolute 0.1 0.0 - 0.7 K/uL   Basophils Relative 0 %   Basophils Absolute 0.0 0.0 - 0.1 K/uL  Basic metabolic panel   Collection Time: 10/19/15  2:10 PM  Result Value Ref Range   Sodium 135 135 - 145 mmol/L   Potassium 3.9 3.5 - 5.1 mmol/L   Chloride 106 101 - 111 mmol/L   CO2 24 22 - 32 mmol/L   Glucose, Bld 88 65 - 99 mg/dL   BUN 15 6 - 20 mg/dL   Creatinine, Ser 0.74 0.44 - 1.00 mg/dL   Calcium 8.5 (L) 8.9 - 10.3 mg/dL   GFR calc non Af Amer >60 >60 mL/min   GFR calc Af Amer >60 >60 mL/min   Anion gap 5 5 -  15  Troponin I   Collection Time: 10/19/15  2:10 PM  Result Value Ref Range   Troponin I <0.03 <0.031 ng/mL  Results for orders placed or performed during the hospital encounter of 09/04/15 (from the past 8736 hour(s))  Troponin I   Collection Time: 09/04/15  5:00 PM  Result Value Ref Range   Troponin I <0.03 <0.031 ng/mL  Basic metabolic panel   Collection Time: 09/04/15  5:00 PM  Result Value Ref Range   Sodium 137 135 - 145 mmol/L   Potassium 3.8 3.5 - 5.1 mmol/L   Chloride 103 101 - 111 mmol/L   CO2 27 22 - 32 mmol/L   Glucose, Bld 100 (H) 65 - 99 mg/dL   BUN 17 6 - 20 mg/dL   Creatinine, Ser 0.89 0.44 - 1.00 mg/dL   Calcium 9.5 8.9 - 10.3 mg/dL   GFR calc non Af Amer >60 >60 mL/min   GFR calc Af Amer >60 >60 mL/min   Anion gap 7 5 - 15  CBC with Differential   Collection Time: 09/04/15  5:00 PM  Result Value Ref Range   WBC 8.5 4.0 - 10.5 K/uL   RBC 4.55 3.87 - 5.11 MIL/uL   Hemoglobin 13.5 12.0 -  15.0 g/dL   HCT 40.7 36.0 - 46.0 %   MCV 89.5 78.0 - 100.0 fL   MCH 29.7 26.0 - 34.0 pg   MCHC 33.2 30.0 - 36.0 g/dL   RDW 14.3 11.5 - 15.5 %   Platelets 283 150 - 400 K/uL   Neutrophils Relative % 73 %   Neutro Abs 6.3 1.7 - 7.7 K/uL   Lymphocytes Relative 19 %   Lymphs Abs 1.6 0.7 - 4.0 K/uL   Monocytes Relative 7 %   Monocytes Absolute 0.6 0.1 - 1.0 K/uL   Eosinophils Relative 1 %   Eosinophils Absolute 0.1 0.0 - 0.7 K/uL   Basophils Relative 0 %   Basophils Absolute 0.0 0.0 - 0.1 K/uL   Assessment.  Axis I Maj.  Depressive disorder , alcohol abuse.  anxiety disorder NOS  Axis II deferred Axis III hypertension  Axis IV moderate Axis V 60-65  Plan/Discussion: I took her vitals.  I reviewed CC, tobacco/med/surg Hx, meds effects/ side effects, problem list, therapies and responses as well as current situation/symptoms discussed options. Patient will continue Neurontin  100 mg daily. She will continue BuSpar 15 mg to 3 times a day for anxiety.will discontinue Prozac and start Cymbalta 60 mg daily. She'll return in 6 weeks See orders and pt instructions for more details.  MEDICATIONS this encounter: Meds ordered this encounter  Medications  . DULoxetine (CYMBALTA) 60 MG capsule    Sig: Take 1 capsule (60 mg total) by mouth daily.    Dispense:  30 capsule    Refill:  2  . busPIRone (BUSPAR) 15 MG tablet    Sig: Take 1 tablet (15 mg total) by mouth 2 (two) times daily.    Dispense:  180 tablet    Refill:  2  . gabapentin (NEURONTIN) 100 MG capsule    Sig: Take 1 capsule (100 mg total) by mouth at bedtime.    Dispense:  90 capsule    Refill:  2   Medical Decision Making Problem Points:  Established problem, stable/improving (1), Review of last therapy session (1) and Review of psycho-social stressors (1) Data Points:  Review or order clinical lab tests (1) Review of medication regiment & side effects (2)  I  certify that outpatient services furnished can reasonably be  expected to improve the patient's condition.   Levonne Spiller, MD

## 2016-05-18 ENCOUNTER — Ambulatory Visit: Payer: Medicare Other | Admitting: *Deleted

## 2016-05-18 DIAGNOSIS — R2681 Unsteadiness on feet: Secondary | ICD-10-CM

## 2016-05-18 DIAGNOSIS — M6281 Muscle weakness (generalized): Secondary | ICD-10-CM | POA: Diagnosis not present

## 2016-05-18 NOTE — Therapy (Signed)
[facility] [place], [place], [address] Phone: [phone]   Fax:  [phone]  Physical Therapy Treatment  Patient Details  Name: Alexandria Chen MRN: [id number] Date of Birth: Sep 07, 1934 Referring Provider: Evelina Dun, FNP  Encounter Date: 05/18/2016      PT End of Session - 05/18/16 1660    Visit Number 9   Number of Visits 12   Date for PT Re-Evaluation 06/26/16   PT Start Time 1430   PT Stop Time 6301   PT Time Calculation (min) 49 min      Past Medical History:  Diagnosis Date  . Alcohol abuse, in remission   . Anxiety   . Depression   . HTN (hypertension)   . Osteopenia     No past surgical history on file.  There were no vitals filed for this visit.      Subjective Assessment - 05/18/16 1435    Subjective Doing better today, but I forgot my cane again   Patient is accompained by: Family member   Pertinent History anxiety, HTN, osteopenia   Patient Stated Goals to walk better   Currently in Pain? No/denies            [facility] PT Assessment - 05/18/16 0001      Berg Balance Test   Sit to Stand Able to stand without using hands and stabilize independently   Standing Unsupported Able to stand safely 2 minutes   Sitting with Back Unsupported but Feet Supported on Floor or Stool Able to sit safely and securely 2 minutes   Stand to Sit Sits safely with minimal use of hands   Transfers Able to transfer safely, minor use of hands   Standing Unsupported with Eyes Closed Able to stand 10 seconds safely   Standing Ubsupported with Feet Together Able to place feet together independently and stand 1 minute safely   From Standing, Reach Forward with Outstretched Arm Can reach forward >12 cm safely (5")   From Standing Position, Pick up Object from Floor Able to pick up shoe safely and easily   From Standing Position, Turn to Look Behind Over each Shoulder Looks behind one side only/other side shows less  weight shift   Turn 360 Degrees Able to turn 360 degrees safely in 4 seconds or less   Standing Unsupported, Alternately Place Feet on Step/Stool Able to stand independently and complete 8 steps >20 seconds   Standing Unsupported, One Foot in Front Needs help to step but can hold 15 seconds   Standing on One Leg Tries to lift leg/unable to hold 3 seconds but remains standing independently   Total Score 47                     [facility] Adult PT Treatment/Exercise - 05/18/16 0001      Knee/Hip Exercises: Aerobic   Nustep L4 x25mn UE/LE, monitored for progression     Knee/Hip Exercises: Standing   Other Standing Knee Exercises side stepping in hallway with Hand held assist x2     Knee/Hip Exercises: Seated   Long Arc Quad Strengthening;Both;3 sets;10 reps   Long Arc Quad Weight 3 lbs.   Sit to Sand 20 reps;without UE support  SBA                     PT Long Term Goals - 05/05/16 1452      PT LONG TERM GOAL #1   Title I with  HEP   Time 2   Period Weeks   Status On-going     PT LONG TERM GOAL #2   Title Improved BERG to 50/56 or better   Period Weeks   Status On-going  46/56 05/05/16     PT LONG TERM GOAL #3   Title Patient able to ascend/descend stairs with a reciprocal gait and one UE suport without functional weakness present.   Time 6   Period Weeks   Status On-going     PT LONG TERM GOAL #4   Title Patient able to ambulate safely in clinic without gait deviations.   Time 6   Period Weeks   Status On-going               Plan - 05/18/16 1439    Clinical Impression Statement Pt arrived to clinic today without [facility] and said she forgot it, but is trying to use  it more. She did a little on the Berg today and was able to increase her score to 47/56. She has reported no falls since last Rx. She is still very challenged with SLS and one step holds or tandem stances due to balance deficits   Rehab Potential Good   PT Frequency 2x / week   PT  Duration 6 weeks   PT Treatment/Interventions ADLs/Self Care Home Management;Gait training;Stair training;Therapeutic exercise;Therapeutic activities;Balance training;Neuromuscular re-education;Patient/family education;Manual techniques   PT Next Visit Plan cont with POC for functional balance activities (see BERG deficits); steps   PT Home Exercise Plan SLS at countertop, breathing technique (diaphragmatic) for relaxation   Consulted and Agree with Plan of Care Patient      Patient will benefit from skilled therapeutic intervention in order to improve the following deficits and impairments:  Abnormal gait, Decreased balance, Decreased strength  Visit Diagnosis: Unsteadiness on feet  Muscle weakness (generalized)     Problem List Patient Active Problem List   Diagnosis Date Noted  . Syncope and collapse 04/14/2015  . Otitis externa of right ear 12/18/2014  . Hearing loss due to cerumen impaction 12/18/2014  . Essential hypertension, benign 01/14/2014  . Hyperlipidemia 01/14/2014  . Vitamin D deficiency 01/14/2014  . Alcohol dependence in remission ([place]) 10/15/2011    Class: Acute  . Depression 08/31/2011  . Anxiety 08/31/2011    RAMSEUR,CHRIS, PTA 05/18/2016, 3:40 PM  [facility] [place], [place], [address] Phone: [phone]   Fax:  [phone]  Name: BLU MCGLAUN MRN: [id number] Date of Birth: 1934-08-12

## 2016-05-21 ENCOUNTER — Ambulatory Visit (INDEPENDENT_AMBULATORY_CARE_PROVIDER_SITE_OTHER): Payer: Medicare Other | Admitting: Family

## 2016-05-21 ENCOUNTER — Encounter: Payer: Self-pay | Admitting: Family

## 2016-05-21 VITALS — BP 107/68 | HR 88 | Temp 97.5°F | Ht 61.5 in | Wt 125.6 lb

## 2016-05-21 DIAGNOSIS — F419 Anxiety disorder, unspecified: Secondary | ICD-10-CM | POA: Diagnosis not present

## 2016-05-21 DIAGNOSIS — E785 Hyperlipidemia, unspecified: Secondary | ICD-10-CM | POA: Diagnosis not present

## 2016-05-21 DIAGNOSIS — F329 Major depressive disorder, single episode, unspecified: Secondary | ICD-10-CM

## 2016-05-21 DIAGNOSIS — I10 Essential (primary) hypertension: Secondary | ICD-10-CM | POA: Diagnosis not present

## 2016-05-21 DIAGNOSIS — E559 Vitamin D deficiency, unspecified: Secondary | ICD-10-CM | POA: Diagnosis not present

## 2016-05-21 DIAGNOSIS — F32A Depression, unspecified: Secondary | ICD-10-CM

## 2016-05-21 NOTE — Patient Instructions (Signed)

## 2016-05-21 NOTE — Progress Notes (Signed)
Subjective:    Patient ID: Alexandria Chen, female    DOB: 01-Mar-1935, 82 y.o.   MRN: [id number]  Pt presents to the office today for chronic follow up. Pt is followed by Psychologists every 3 months for GAD and Depression.  Hyperlipidemia  This is a chronic problem. The current episode started more than 1 year ago. The problem is uncontrolled. Recent lipid tests were reviewed and are high. She has no history of obesity. Pertinent negatives include no shortness of breath. Current antihyperlipidemic treatment includes statins and herbal therapy. The current treatment provides moderate improvement of lipids. Risk factors for coronary artery disease include dyslipidemia and post-menopausal.  Hypertension  This is a chronic problem. The current episode started more than 1 year ago. The problem has been resolved since onset. The problem is controlled. Associated symptoms include anxiety. Pertinent negatives include no headaches, palpitations, peripheral edema or shortness of breath. Risk factors for coronary artery disease include dyslipidemia, post-menopausal state and sedentary lifestyle. Past treatments include ACE inhibitors. The current treatment provides moderate improvement. There is no history of kidney disease, CAD/MI, CVA or heart failure.  Anxiety  Presents for follow-up visit. Symptoms include depressed mood, excessive worry, irritability, nervous/anxious behavior and restlessness. Patient reports no palpitations or shortness of breath. Symptoms occur most days.    Depression         This is a chronic problem.  The current episode started more than 1 year ago.   The onset quality is gradual.   The problem occurs intermittently.  Associated symptoms include helplessness, restlessness and sad.  Associated symptoms include no hopelessness and no headaches.  Past treatments include TCAs - Tricyclic antidepressants.  Compliance with treatment is good.  Past medical history includes anxiety.        Review of Systems  Constitutional: Positive for irritability.  Respiratory: Negative for shortness of breath.   Cardiovascular: Negative for palpitations.  Neurological: Negative for headaches.  Psychiatric/Behavioral: Positive for depression. The patient is nervous/anxious.   All other systems reviewed and are negative.      Objective:   Physical Exam  Constitutional: She is oriented to person, place, and time. She appears well-developed and well-nourished. No distress.  HENT:  Head: Normocephalic and atraumatic.  Right Ear: External ear normal.  Left Ear: External ear normal.  Nose: Nose normal.  Mouth/Throat: Oropharynx is clear and moist.  Eyes: Pupils are equal, round, and reactive to light.  Neck: Normal range of motion. Neck supple. No thyromegaly present.  Cardiovascular: Normal rate, regular rhythm, normal heart sounds and intact distal pulses.   No murmur heard. Pulmonary/Chest: Effort normal. No respiratory distress. She has decreased breath sounds in the right middle field and the left middle field.  Abdominal: Soft. Bowel sounds are normal. She exhibits no distension. There is no tenderness.  Musculoskeletal: Normal range of motion. She exhibits no edema or tenderness.  Neurological: She is alert and oriented to person, place, and time. She has normal reflexes. No cranial nerve deficit.  Skin: Skin is warm and dry.  Psychiatric: She has a normal mood and affect. Her behavior is normal. Judgment and thought content normal.  Vitals reviewed.    BP 107/68   Pulse 88   Temp 97.5 F (36.4 C) (Oral)   Ht 5' 1.5" (1.562 m)   Wt 125 lb 9.6 oz (57 kg)   BMI 23.35 kg/m       Assessment & Plan:  1. Essential hypertension, benign - CMP14+EGFR  2. Vitamin  D deficiency - CMP14+EGFR - VITAMIN D 25 Hydroxy (Vit-D Deficiency, Fractures)  3. Hyperlipidemia, unspecified hyperlipidemia type - CMP14+EGFR - Lipid panel  4. Depression, unspecified depression  type - CMP14+EGFR  5. Anxiety - CMP14+EGFR   Continue all meds Labs pending Health Maintenance reviewed Diet and exercise encouraged RTO 6 months  Evelina Dun, FNP

## 2016-05-22 LAB — SPECIMEN STATUS

## 2016-05-24 ENCOUNTER — Encounter (HOSPITAL_COMMUNITY): Payer: Self-pay | Admitting: Psychiatry

## 2016-05-24 ENCOUNTER — Ambulatory Visit (INDEPENDENT_AMBULATORY_CARE_PROVIDER_SITE_OTHER): Payer: Medicare Other | Admitting: Psychiatry

## 2016-05-24 DIAGNOSIS — F331 Major depressive disorder, recurrent, moderate: Secondary | ICD-10-CM

## 2016-05-24 LAB — CMP14+EGFR
ALT: 8 IU/L (ref 0–32)
AST: 13 IU/L (ref 0–40)
Albumin/Globulin Ratio: 1.5 (ref 1.2–2.2)
Albumin: 4 g/dL (ref 3.5–4.8)
Alkaline Phosphatase: 70 IU/L (ref 39–117)
BUN/Creatinine Ratio: 20 (ref 12–28)
BUN: 15 mg/dL (ref 8–27)
Bilirubin Total: 0.3 mg/dL (ref 0.0–1.2)
CALCIUM: 10 mg/dL (ref 8.7–10.3)
CHLORIDE: 97 mmol/L (ref 96–106)
CO2: 28 mmol/L (ref 18–29)
Creatinine, Ser: 0.75 mg/dL (ref 0.57–1.00)
GFR, EST AFRICAN AMERICAN: 88 mL/min/{1.73_m2} (ref >59)
GFR, EST NON AFRICAN AMERICAN: 76 mL/min/{1.73_m2} (ref >59)
GLUCOSE: 86 mg/dL (ref 65–99)
Globulin, Total: 2.6 g/dL (ref 1.5–4.5)
Potassium: 4 mmol/L (ref 3.5–5.2)
Sodium: 140 mmol/L (ref 134–144)
TOTAL PROTEIN: 6.6 g/dL (ref 6.0–8.5)

## 2016-05-24 LAB — LIPID PANEL
CHOL/HDL RATIO: 3.4 ratio (ref 0.0–4.4)
Cholesterol, Total: 161 mg/dL (ref 100–199)
HDL: 48 mg/dL (ref >39)
LDL Calculated: 100 mg/dL — ABNORMAL HIGH (ref 0–99)
TRIGLYCERIDES: 63 mg/dL (ref 0–149)
VLDL CHOLESTEROL CAL: 13 mg/dL (ref 5–40)

## 2016-05-24 LAB — VITAMIN D 25 HYDROXY (VIT D DEFICIENCY, FRACTURES): VIT D 25 HYDROXY: 35.1 ng/mL (ref 30.0–100.0)

## 2016-05-24 NOTE — Progress Notes (Signed)
Alexandria Chen           DOB:                               Jul 23, 1934  MR Number:                  [id number]  Location:                        [place]:  [address] [place],  [place], [address]  Start:                              Monday 05/24/2016    9:55 AM  End:                               Monday 1/22/22018 10:42 AM  Provider/Observer:                           Maurice Small, MSW, LCSW   Chief Complaint:                                   Chief Complaint  Patient presents with  . Anxiety    Reason For Service:                                     The patient is an 82 year old African American female who has a long-standing history of recurrent periods of symptoms of depression and anxiety. Patient also has a history of alcohol abuse/dependence and was hospitalized in July 2013 due to relapse and drinking approximately a 6 pack of beer daily along with experiencing increased symptoms of depression and anxiety. This was patient's third psychiatric hospitalization. Patient last was seen in February 2015. She is resuming services today as she reports she resumed alcohol use in November 2017 and has been drinking one can of beer nearly every day since that time to try to calm her nerves. She wants to improve coping skills and avoid going to detox. She reports increased anxiety, excessive worry, memory difficulty,poor motivation, and fatigue.   Interventions Strategy:                    Supportive therapy, cognitive behavioral therapy  Participation Level:                           Active  Participation Quality:                       Appropriate                            Behavioral Observation:                   Fidgety, talkative, cooperative, anxious, confused at times  Current Psychosocial Factors:       Patient's 80 year-old grandson and his 43 year old daughter are residing with patient (stressful as they can both be argumentative and don't clean up after  themselves  per patient's report), concerns about her own health as she has lost her hearing aid, needs glasses, and has some difficulty walking but is going to physical therapy, concerns about youngest daughter who has health issues,    Content of Session:                          Reviewed symptoms, discussed stressors, facilitated expression of feelings, assisted patient identify ways to  improve routine and structure, assisted patient identify ways to improve self-care especially regarding eating patterns  Current Status:                                  increased anxiety, excessive worry, memory difficulty,poor motivation, and fatigue.    Patient Progress:                              Poor. The patient reports she had been managing anxiety fairly well until a couple of months ago. She reports becoming more nervous and starting to use beer again. This appears to have been triggered by stress associated with grandson and his daughter moving back to patient's home suddenly after they had moved out. Patient reports grandson and his daughter both are argumentative and don't clean up after themselves. She says he plans to move out once he gets his tax refund.  She also reports worry about her health, the aging process, and concerns about her youngest daughter who has health issues.   Target Goals:                      1.   Improve mood, decrease anxiety and excessive worry, and resume normal interest in activities   Last Reviewed:                                                  Goals Addressed Today:                 1       Impression/Diagnosis:                     The patient has a long-standing history of recurrent periods of depression and anxiety.  Her symptoms have included depressed mood, social withdrawal, lack of interest in activities, poor motivation, excessive  worry, feeling jittery, and ruminating thoughts.  Patient also has a history of alcohol dependence . Diagnoses: Maj.  depressive disorder, recurrent, moderate; generalized anxiety disorder, alcohol dependence  Diagnosis:                  Axis I:  Major depressive disorder, recurrent, moderate  Generalized anxiety disorder  Axis II: No diagnosis        Bron Snellings, LCSW 05/24/2016

## 2016-05-25 ENCOUNTER — Ambulatory Visit: Payer: Medicare Other | Admitting: *Deleted

## 2016-05-25 DIAGNOSIS — R2681 Unsteadiness on feet: Secondary | ICD-10-CM | POA: Diagnosis not present

## 2016-05-25 DIAGNOSIS — M6281 Muscle weakness (generalized): Secondary | ICD-10-CM | POA: Diagnosis not present

## 2016-05-25 NOTE — Therapy (Signed)
[facility] [place], [place], [address] Phone: [phone]   Fax:  [phone]  Physical Therapy Treatment  Patient Details  Name: Alexandria Chen MRN: [id number] Date of Birth: February 26, 1935 Referring Provider: Evelina Dun, FNP  Encounter Date: 05/25/2016      PT End of Session - 05/25/16 1528    Visit Number 10   Number of Visits 12   Date for PT Re-Evaluation 06/26/16   PT Start Time 1856   PT Stop Time 1529   PT Time Calculation (min) 51 min      Past Medical History:  Diagnosis Date  . Alcohol abuse, in remission   . Anxiety   . Depression   . HTN (hypertension)   . Osteopenia     No past surgical history on file.  There were no vitals filed for this visit.                       [facility] Adult PT Treatment/Exercise - 05/25/16 0001      Knee/Hip Exercises: Aerobic   Nustep L4 x20 min UE/LE, monitored for progression     Knee/Hip Exercises: Standing   Other Standing Knee Exercises side stepping in hallway with Hand held assist x2     Knee/Hip Exercises: Seated   Long Arc Quad Strengthening;Both;3 sets;10 reps   Long Arc Quad Weight 3 lbs.   Sit to Sand 20 reps;without UE support  SBA             Balance Exercises - 05/25/16 1522      Balance Exercises: Standing   Standing Eyes Opened Narrow base of support (BOS);Wide (BOA);Foam/compliant surface   Standing Eyes Closed --  65mn each   Tandem Stance Eyes open;Intermittent upper extremity support;4 reps   Standing, One Foot on a Step Eyes open;6 inch;4 reps;20 secs   Rockerboard Anterior/posterior   Step Ups 6 inch;UE support 1  2x10   Marching Limitations 2x10   Other Standing Exercises hip abd 2x10                PT Long Term Goals - 05/05/16 1452      PT LONG TERM GOAL #1   Title I with HEP   Time 2   Period Weeks   Status On-going     PT LONG TERM GOAL #2   Title Improved BERG to 50/56 or better   Period  Weeks   Status On-going  46/56 05/05/16     PT LONG TERM GOAL #3   Title Patient able to ascend/descend stairs with a reciprocal gait and one UE suport without functional weakness present.   Time 6   Period Weeks   Status On-going     PT LONG TERM GOAL #4   Title Patient able to ambulate safely in clinic without gait deviations.   Time 6   Period Weeks   Status On-going               Plan - 05/25/16 1534    Clinical Impression Statement [facility]. Pt tolerated strength training a little better today and balance act.'s, but still very challenged on SLS and tandem stance.   Rehab Potential Good   PT Frequency 2x / week   PT Duration 6 weeks   PT Treatment/Interventions ADLs/Self Care Home Management;Gait training;Stair training;Therapeutic exercise;Therapeutic activities;Balance training;Neuromuscular re-education;Patient/family education;Manual techniques   PT Next Visit Plan cont with POC  for functional balance activities (see BERG deficits); steps   PT Home Exercise Plan SLS at countertop, breathing technique (diaphragmatic) for relaxation   Consulted and Agree with Plan of Care Patient      Patient will benefit from skilled therapeutic intervention in order to improve the following deficits and impairments:  Abnormal gait, Decreased balance, Decreased strength  Visit Diagnosis: Unsteadiness on feet  Muscle weakness (generalized)       G-Codes - 2016-05-27 1542    Functional Assessment Tool Used BERG 47/56 Gcode 10th visit   Mobility: Walking and Moving Around Current Status [phone]) At least 20 percent but less than 40 percent impaired, limited or restricted   Mobility: Walking and Moving Around Goal Status [phone]) At least 1 percent but less than 20 percent impaired, limited or restricted      Problem List Patient Active Problem List   Diagnosis Date Noted  . Syncope and collapse 04/14/2015  . Otitis externa of right  ear 12/18/2014  . Hearing loss due to cerumen impaction 12/18/2014  . Essential hypertension, benign 01/14/2014  . Hyperlipidemia 01/14/2014  . Vitamin D deficiency 01/14/2014  . Alcohol dependence in remission ([place]) 10/15/2011    Class: Acute  . Depression 08/31/2011  . Anxiety 08/31/2011    Chesney Klimaszewski,CHRIS, PTA May 27, 2016, 3:48 PM  [facility] [place], [place], [address] Phone: [phone]   Fax:  [phone]  Name: Alexandria Chen MRN: [id number] Date of Birth: 1934-06-15

## 2016-05-28 ENCOUNTER — Other Ambulatory Visit: Payer: Self-pay | Admitting: Pharmacist

## 2016-06-01 ENCOUNTER — Ambulatory Visit: Payer: Medicare Other | Admitting: *Deleted

## 2016-06-01 DIAGNOSIS — M6281 Muscle weakness (generalized): Secondary | ICD-10-CM | POA: Diagnosis not present

## 2016-06-01 DIAGNOSIS — R2681 Unsteadiness on feet: Secondary | ICD-10-CM

## 2016-06-01 NOTE — Therapy (Signed)
[facility] [place], [place], [address] Phone: [phone]   Fax:  [phone]  Physical Therapy Treatment  Patient Details  Name: Alexandria Chen MRN: [id number] Date of Birth: 1935-02-26 Referring Provider: Evelina Dun, FNP  Encounter Date: 06/01/2016      PT End of Session - 06/01/16 1256    Visit Number 11   Number of Visits 12   Date for PT Re-Evaluation 06/26/16   PT Start Time 1119   PT Stop Time 1210   PT Time Calculation (min) 51 min      Past Medical History:  Diagnosis Date  . Alcohol abuse, in remission   . Anxiety   . Depression   . HTN (hypertension)   . Osteopenia     No past surgical history on file.  There were no vitals filed for this visit.      Subjective Assessment - 06/01/16 1137    Subjective Forgot cane today, but doing Ok   Patient is accompained by: Family member   Pertinent History anxiety, HTN, osteopenia   Patient Stated Goals to walk better   Currently in Pain? No/denies                         [facility] Adult PT Treatment/Exercise - 06/01/16 0001      Knee/Hip Exercises: Aerobic   Nustep L5 x20 min UE/LE, monitored for progression     Knee/Hip Exercises: Standing   Other Standing Knee Exercises side stepping in hallway with Hand held assist x2     Knee/Hip Exercises: Seated   Long Arc Quad Strengthening;Both;3 sets;10 reps   Long Arc Quad Weight 3 lbs.   Sit to Sand 20 reps;without UE support  SBA             Balance Exercises - 06/01/16 1139      Balance Exercises: Standing   Standing Eyes Opened Narrow base of support (BOS);Wide (BOA);Foam/compliant surface   Tandem Stance Eyes open;Intermittent upper extremity support;4 reps   Standing, One Foot on a Step Eyes open;6 inch;4 reps;20 secs   Rockerboard Anterior/posterior   Step Ups 6 inch;UE support 1  2x10   Marching Limitations 2x10   Other Standing Exercises hip abd 2x10                 PT Long Term Goals - 05/05/16 1452      PT LONG TERM GOAL #1   Title I with HEP   Time 2   Period Weeks   Status On-going     PT LONG TERM GOAL #2   Title Improved BERG to 50/56 or better   Period Weeks   Status On-going  46/56 05/05/16     PT LONG TERM GOAL #3   Title Patient able to ascend/descend stairs with a reciprocal gait and one UE suport without functional weakness present.   Time 6   Period Weeks   Status On-going     PT LONG TERM GOAL #4   Title Patient able to ambulate safely in clinic without gait deviations.   Time 6   Period Weeks   Status On-going               Plan - 06/01/16 1257    Clinical Impression Statement Pt arrived to clinic today without SPC due to forgetting it. She was able to complete Therex and balance activities, but needed cues for te chnique for ex.'s and CGA/SBA  for all balance Exs.. Pt actually needed more CGA during toe tap and tandem stance today than last visit   Rehab Potential Good   PT Frequency 2x / week   PT Duration 6 weeks   PT Treatment/Interventions ADLs/Self Care Home Management;Gait training;Stair training;Therapeutic exercise;Therapeutic activities;Balance training;Neuromuscular re-education;Patient/family education;Manual techniques   PT Next Visit Plan cont with POC for functional balance activities (see BERG deficits); steps   PT Home Exercise Plan SLS at countertop, breathing technique (diaphragmatic) for relaxation   Consulted and Agree with Plan of Care Patient      Patient will benefit from skilled therapeutic intervention in order to improve the following deficits and impairments:  Abnormal gait, Decreased balance, Decreased strength  Visit Diagnosis: Unsteadiness on feet  Muscle weakness (generalized)     Problem List Patient Active Problem List   Diagnosis Date Noted  . Syncope and collapse 04/14/2015  . Otitis externa of right ear 12/18/2014  . Hearing loss due to cerumen  impaction 12/18/2014  . Essential hypertension, benign 01/14/2014  . Hyperlipidemia 01/14/2014  . Vitamin D deficiency 01/14/2014  . Alcohol dependence in remission ([place]) 10/15/2011    Class: Acute  . Depression 08/31/2011  . Anxiety 08/31/2011    Demonica Farrey,CHRIS, PTA 06/01/2016, 1:10 PM  [facility] [place], [place], [address] Phone: [phone]   Fax:  [phone]  Name: Alexandria Chen MRN: [id number] Date of Birth: 13-Apr-1935

## 2016-06-07 ENCOUNTER — Encounter (HOSPITAL_COMMUNITY): Payer: Self-pay | Admitting: Psychiatry

## 2016-06-07 ENCOUNTER — Ambulatory Visit (INDEPENDENT_AMBULATORY_CARE_PROVIDER_SITE_OTHER): Payer: Medicare Other | Admitting: Psychiatry

## 2016-06-07 DIAGNOSIS — F331 Major depressive disorder, recurrent, moderate: Secondary | ICD-10-CM | POA: Diagnosis not present

## 2016-06-07 NOTE — Progress Notes (Signed)
Alexandria Chen           DOB:                               May 10, 1934  MR Number:                  [id number]  Location:                        [place]:  [address] [place],  [place], [address]  Start:                              Monday 06/07/2016 11:10 AM End:                               Monday 06/07/2016 11:56 AM  Provider/Observer:                           Maurice Small, MSW, LCSW   Chief Complaint:                                   Chief Complaint  Patient presents with  . Anxiety    Reason For Service:                                     The patient is a 82 year old African American female who has a long-standing history of recurrent periods of symptoms of depression and anxiety. Patient also has a history of alcohol abuse/dependence and was hospitalized in July 2013 due to relapse and drinking approximately a 6 pack of beer daily along with experiencing increased symptoms of depression and anxiety. This was patient's third psychiatric hospitalization. Patient last was seen in February 2015. She is resuming services today as she reports she resumed alcohol use in November 2017 and has been drinking one can of beer nearly every day since that time to try to calm her nerves. She wants to improve coping skills and avoid going to detox. She reports increased anxiety, excessive worry, memory difficulty,poor motivation, and fatigue.   Interventions Strategy:                    Supportive therapy, cognitive behavioral therapy  Participation Level:                           Active  Participation Quality:                       Appropriate                            Behavioral Observation:                   less fidgety, talkative, cooperative, anxious, confused at times  Current Psychosocial Factors:       Patient's 83 year-old grandson and his 63 year old daughter are residing with patient (stressful as they can both be argumentative and don't clean up after  themselves per patient's report),  concerns about her own health as she has lost her hearing aid, needs glasses, and has some difficulty walking but is going to physical therapy, concerns about youngest daughter who has health issues,    Content of Session:                          Reviewed symptoms, discussed stressors, praised and reinforced patient's increased involvement in activity and efforts to improve self-care, daily routine and structure, discussed support system and strengths, developed treatment plan, discussed rationale for and practiced controlled breathing, assigned patient to practice controlled breathing 5 minutes 2 x per day.  Current Status:                                  less depressed mood,  anxiety, excessive worry, poor motivation, fatigue    Patient Progress:                              Fair. The patient reports less depressed mood but continued anxiety along with excessive worry. She is pleased she has not used any more alcohol since last session. She has also increased efforts to increase involvement in activity. She has been performing light household tasks like cleaning out drawers and cooking. She also has tried to improve her eating patterns including eating breakfast and a light lunch and dinner. She also has switched from caffeinated coffee to decaffeinated coffee. She also has tried to avoid sodas and instead, has been drinking flavored water. She continues to express frustration regarding grandson but says he hopes to move once he gets his tax refund. She expresses less worry about her physical health as recent medical tests indicate she has no problems with her kidneys. She continues to attend physical therapy regularly.   Target Goals:                        1. Learn and implement behavioral strategies to overcome depression.      2. Learn and implement calming skills.        3. Identify, challenge, and replace negative self talk that evokes anxiety with healthy  alternatives.   Last Reviewed:   06/07/2016                                                  Goals Addressed Today:                 1,2,3  Impression/Diagnosis:                     The patient has a long-standing history of recurrent periods of depression and anxiety.  Her symptoms have included depressed mood, social withdrawal, lack of interest in activities, poor motivation, excessive  worry, feeling jittery, and ruminating thoughts.  Patient also has a history of alcohol dependence . Diagnoses: Maj. depressive disorder, recurrent, moderate; generalized anxiety disorder, alcohol dependence  Diagnosis:                  Axis I:  Major depressive disorder, recurrent, moderate  Generalized anxiety disorder  Axis II: No diagnosis        Adely Facer, LCSW 06/07/2016

## 2016-06-08 ENCOUNTER — Ambulatory Visit: Payer: Medicare Other | Attending: Family | Admitting: *Deleted

## 2016-06-08 DIAGNOSIS — R2681 Unsteadiness on feet: Secondary | ICD-10-CM | POA: Insufficient documentation

## 2016-06-08 DIAGNOSIS — M6281 Muscle weakness (generalized): Secondary | ICD-10-CM | POA: Diagnosis not present

## 2016-06-08 NOTE — Therapy (Signed)
[facility] [place], [place], [address] Phone: [phone]   Fax:  [phone]  Physical Therapy Treatment  Patient Details  Name: Alexandria Chen MRN: [id number] Date of Birth: 02-Jun-1934 Referring Provider: Evelina Dun, FNP  Encounter Date: 06/08/2016      PT End of Session - 06/08/16 1128    Visit Number 12   Number of Visits 12   Date for PT Re-Evaluation 06/26/16   PT Start Time 1115   PT Stop Time 1205   PT Time Calculation (min) 50 min   Activity Tolerance Patient tolerated treatment well   Behavior During Therapy [facility] for tasks assessed/performed      Past Medical History:  Diagnosis Date  . Alcohol abuse, in remission   . Anxiety   . Depression   . HTN (hypertension)   . Osteopenia     No past surgical history on file.  There were no vitals filed for this visit.      Subjective Assessment - 06/08/16 1125    Subjective Not doing as well today. My LB and RT leg is hurting today.   Patient is accompained by: Family member   Pertinent History anxiety, HTN, osteopenia   Patient Stated Goals to walk better   Currently in Pain? Yes   Pain Score 4    Pain Location Back   Pain Orientation Right   Pain Descriptors / Indicators Aching                         [facility] Adult PT Treatment/Exercise - 06/08/16 0001      Knee/Hip Exercises: Aerobic   Nustep L5 x20 min UE/LE, monitored for progression     Knee/Hip Exercises: Standing   Rocker Board 5 minutes  PF/DF balance SBA     Knee/Hip Exercises: Seated   Long Arc Quad Strengthening;Both;3 sets;10 reps   Long Arc Quad Weight 3 lbs.     Knee/Hip Exercises: Supine   Straight Leg Raises Both;2 sets;10 reps   Other Supine Knee/Hip Exercises supine marching 2x10                     PT Long Term Goals - 06/08/16 1204      PT LONG TERM GOAL #1   Title I with HEP   Time 2   Period Weeks   Status Achieved     PT LONG  TERM GOAL #2   Title Improved BERG to 50/56 or better   Time 6   Period Weeks   Status Not Met  47/56     PT LONG TERM GOAL #3   Title Patient able to ascend/descend stairs with a reciprocal gait and one UE suport without functional weakness present.   Time 6   Period Weeks   Status Not Met  Pain and weakness RT LE     PT LONG TERM GOAL #4   Title Patient able to ambulate safely in clinic without gait deviations.   Time 6   Period Weeks   Status Not Met  pain and weakness RT LE               Plan - 06/08/16 1210    Rehab Potential (P)  Good   PT Frequency (P)  2x / week   PT Duration (P)  6 weeks   PT Next Visit Plan (P)  cont with POC for functional balance activities (see BERG deficits); steps  PT Home Exercise Plan (P)  SLS at countertop, breathing technique (diaphragmatic) for relaxation   Consulted and Agree with Plan of Care (P)  Patient      Patient will benefit from skilled therapeutic intervention in order to improve the following deficits and impairments:  (P) Abnormal gait, Decreased balance, Decreased strength  Visit Diagnosis: Unsteadiness on feet  Muscle weakness (generalized)       G-Codes - 06/13/2016 1210    Functional Assessment Tool Used DC Merrilee Jansky 47/56      Problem List Patient Active Problem List   Diagnosis Date Noted  . Syncope and collapse 04/14/2015  . Otitis externa of right ear 12/18/2014  . Hearing loss due to cerumen impaction 12/18/2014  . Essential hypertension, benign 01/14/2014  . Hyperlipidemia 01/14/2014  . Vitamin D deficiency 01/14/2014  . Alcohol dependence in remission ([place]) 10/15/2011    Class: Acute  . Depression 08/31/2011  . Anxiety 08/31/2011    Riaan Toledo,CHRIS, PT June 13, 2016, 3:55 PM  [facility] [place], [place], [address] Phone: [phone]   Fax:  [phone]  Name: PARA COSSEY MRN: [id number] Date of Birth: [birth year] PHYSICAL THERAPY  DISCHARGE SUMMARY  Visits from Start of Care: 12.  Current functional level related to goals / functional outcomes: See above.   Remaining deficits: Good progress but goals #2 to #4 unmet.   Education / Equipment: HEP. Plan: Patient agrees to discharge.  Patient goals were partially met. Patient is being discharged due to being pleased with the current functional level.  ?????         Mali Applegate MPT

## 2016-06-21 ENCOUNTER — Telehealth (HOSPITAL_COMMUNITY): Payer: Self-pay | Admitting: *Deleted

## 2016-06-21 NOTE — Telephone Encounter (Signed)
left voice message, provider out of office 06/24/16. 

## 2016-06-24 ENCOUNTER — Encounter: Payer: Self-pay | Admitting: Family

## 2016-06-24 ENCOUNTER — Ambulatory Visit (HOSPITAL_COMMUNITY): Payer: Self-pay | Admitting: Psychiatry

## 2016-06-24 ENCOUNTER — Ambulatory Visit (INDEPENDENT_AMBULATORY_CARE_PROVIDER_SITE_OTHER): Payer: Medicare Other | Admitting: Family

## 2016-06-24 ENCOUNTER — Ambulatory Visit (INDEPENDENT_AMBULATORY_CARE_PROVIDER_SITE_OTHER): Payer: Medicare Other

## 2016-06-24 VITALS — BP 111/73 | HR 64 | Temp 97.4°F | Ht 61.5 in | Wt 125.0 lb

## 2016-06-24 DIAGNOSIS — R05 Cough: Secondary | ICD-10-CM

## 2016-06-24 DIAGNOSIS — R059 Cough, unspecified: Secondary | ICD-10-CM

## 2016-06-24 DIAGNOSIS — R410 Disorientation, unspecified: Secondary | ICD-10-CM

## 2016-06-24 LAB — URINALYSIS, COMPLETE
BILIRUBIN UA: NEGATIVE
GLUCOSE, UA: NEGATIVE
Ketones, UA: NEGATIVE
LEUKOCYTES UA: NEGATIVE
Nitrite, UA: NEGATIVE
PH UA: 5 (ref 5.0–7.5)
PROTEIN UA: NEGATIVE
RBC UA: NEGATIVE
Specific Gravity, UA: 1.015 (ref 1.005–1.030)
Urobilinogen, Ur: 0.2 mg/dL (ref 0.2–1.0)

## 2016-06-24 LAB — MICROSCOPIC EXAMINATION
BACTERIA UA: NONE SEEN
RBC, UA: NONE SEEN /hpf (ref 0–?)
RENAL EPITHEL UA: NONE SEEN /HPF

## 2016-06-24 NOTE — Progress Notes (Signed)
   Subjective:    Patient ID: Alexandria Chen, female    DOB: 02/10/1935, 82 y.o.   MRN: [id number]  Pt is brought in by daughter today for altered mental status at times. Pt is A&O X4.  Altered Mental Status  This is a new problem. The current episode started in the past 7 days. The problem occurs every several days. The problem has been waxing and waning. Associated symptoms include urinary symptoms. Pertinent negatives include no chest pain, chills, congestion, fever, headaches or swollen glands. She has tried rest for the symptoms. The treatment provided no relief.      Review of Systems  Constitutional: Negative for chills and fever.  HENT: Negative for congestion.   Cardiovascular: Negative for chest pain.  Neurological: Negative for headaches.  All other systems reviewed and are negative.      Objective:   Physical Exam  Constitutional: She is oriented to person, place, and time. She appears well-developed and well-nourished. No distress.  HENT:  Head: Normocephalic.  Eyes: Pupils are equal, round, and reactive to light.  Neck: Normal range of motion. Neck supple. No thyromegaly present.  Cardiovascular: Normal rate, regular rhythm, normal heart sounds and intact distal pulses.   No murmur heard. Pulmonary/Chest: Effort normal and breath sounds normal. No respiratory distress. She has no wheezes.  Abdominal: Soft. Bowel sounds are normal. She exhibits no distension. There is no tenderness.  Musculoskeletal: Normal range of motion. She exhibits no edema or tenderness.  Neurological: She is alert and oriented to person, place, and time.  Skin: Skin is warm and dry.  Psychiatric: She has a normal mood and affect. Her behavior is normal. Judgment and thought content normal.  Vitals reviewed.     BP (!) 149/89   Pulse 69   Temp 97.4 F (36.3 C) (Oral)   Ht 5' 1.5" (1.562 m)   Wt 125 lb (56.7 kg)   BMI 23.24 kg/m      Assessment & Plan:  1. Confusion -Urine  negative -Labs pending to rule out infection -Chest x-ray pending - Urinalysis, Complete - CMP14+EGFR - CBC with Differential/Platelet - DG Chest 2 View; Future  2. Cough - CMP14+EGFR - CBC with Differential/Platelet - DG Chest 2 View; Future  Could be related to starting Cymbalta? Pt was told to stop Prozac and start Cymbalta less than a month ago. If labs are normal and chest x-ray negative may need to adjust medication if confusion continues? Pt is A&O today.  Christy Hawks, FNP  

## 2016-06-24 NOTE — Patient Instructions (Signed)
Confusion Introduction Confusion is the inability to think with your usual speed or clarity. Confusion may come on quickly or slowly over time. How quickly the confusion comes on depends on the cause. Confusion can be due to any number of causes. What are the causes?  Concussion, head injury, or head trauma.  Seizures.  Stroke.  Fever.  Brain tumor.  Age related decreased brain function (dementia).  Heightened emotional states like rage or terror.  Mental illness in which the person loses the ability to determine what is real and what is not (hallucinations).  Infections such as a urinary tract infection (UTI).  Toxic effects from alcohol, drugs, or prescription medicines.  Dehydration and an imbalance of salts in the body (electrolytes).  Lack of sleep.  Low blood sugar (diabetes).  Low levels of oxygen from conditions such as chronic lung disorders.  Drug interactions or other medicine side effects.  Nutritional deficiencies, especially niacin, thiamine, vitamin C, or vitamin B.  Sudden drop in body temperature (hypothermia).  Change in routine, such as when traveling or hospitalized. What are the signs or symptoms? People often describe their thinking as cloudy or unclear when they are confused. Confusion can also include feeling disoriented. That means you are unaware of where or who you are. You may also not know what the date or time is. If confused, you may also have difficulty paying attention, remembering, and making decisions. Some people also act aggressively when they are confused. How is this diagnosed? The medical evaluation of confusion may include:  Blood and urine tests.  X-rays.  Brain and nervous system tests.  Analyzing your brain waves (electroencephalogram or EEG).  Magnetic resonance imaging (MRI) of your head.  Computed tomography (CT) scan of your head.  Mental status tests in which your health care provider may ask many questions.  Some of these questions may seem silly or strange, but they are a very important test to help diagnose and treat confusion. How is this treated? An admission to the hospital may not be needed, but a person with confusion should not be left alone. Stay with a family member or friend until the confusion clears. Avoid alcohol, pain relievers, or sedative drugs until you have fully recovered. Do not drive until directed by your health care provider. Follow these instructions at home: What family and friends can do:  To find out if someone is confused, ask the person to state his or her name, age, and the date. If the person is unsure or answers incorrectly, he or she is confused.  Always introduce yourself, no matter how well the person knows you.  Often remind the person of his or her location.  Place a calendar and clock near the confused person.  Help the person with his or her medicines. You may want to use a pill box, an alarm as a reminder, or give the person each dose as prescribed.  Talk about current events and plans for the day.  Try to keep the environment calm, quiet, and peaceful.  Make sure the person keeps follow-up visits with his or her health care provider. How is this prevented? Ways to prevent confusion:  Avoid alcohol.  Eat a balanced diet.  Get enough sleep.  Take medicine only as directed by your health care provider.  Do not become isolated. Spend time with other people and make plans for your days.  Keep careful watch on your blood sugar levels if you are diabetic. Get help right away if:  You develop severe headaches, repeated vomiting, seizures, blackouts, or slurred speech.  There is increasing confusion, weakness, numbness, restlessness, or personality changes.  You develop a loss of balance, have marked dizziness, feel uncoordinated, or fall.  You have delusions, hallucinations, or develop severe anxiety.  Your family members think you need to be  rechecked. This information is not intended to replace advice given to you by your health care provider. Make sure you discuss any questions you have with your health care provider. Document Released: 05/27/2004 Document Revised: 11/07/2015 Document Reviewed: 05/25/2013  2017 Elsevier

## 2016-06-25 ENCOUNTER — Other Ambulatory Visit: Payer: Self-pay | Admitting: Family

## 2016-06-25 ENCOUNTER — Encounter (HOSPITAL_COMMUNITY): Payer: Self-pay | Admitting: Radiology

## 2016-06-25 ENCOUNTER — Ambulatory Visit (HOSPITAL_COMMUNITY)
Admission: RE | Admit: 2016-06-25 | Discharge: 2016-06-25 | Disposition: A | Discharge status: Home / Self Care | Payer: Medicare Other | Source: Ambulatory Visit | Attending: Family | Admitting: Family

## 2016-06-25 DIAGNOSIS — E279 Disorder of adrenal gland, unspecified: Secondary | ICD-10-CM | POA: Insufficient documentation

## 2016-06-25 DIAGNOSIS — I2510 Atherosclerotic heart disease of native coronary artery without angina pectoris: Secondary | ICD-10-CM | POA: Diagnosis not present

## 2016-06-25 DIAGNOSIS — J439 Emphysema, unspecified: Secondary | ICD-10-CM | POA: Diagnosis not present

## 2016-06-25 DIAGNOSIS — K769 Liver disease, unspecified: Secondary | ICD-10-CM | POA: Diagnosis not present

## 2016-06-25 DIAGNOSIS — I700 Atherosclerosis of aorta: Secondary | ICD-10-CM | POA: Insufficient documentation

## 2016-06-25 DIAGNOSIS — R918 Other nonspecific abnormal finding of lung field: Secondary | ICD-10-CM | POA: Diagnosis not present

## 2016-06-25 LAB — CMP14+EGFR
A/G RATIO: 1.3 (ref 1.2–2.2)
ALT: 8 IU/L (ref 0–32)
AST: 14 IU/L (ref 0–40)
Albumin: 4 g/dL (ref 3.5–4.8)
Alkaline Phosphatase: 73 IU/L (ref 39–117)
BILIRUBIN TOTAL: 0.3 mg/dL (ref 0.0–1.2)
BUN/Creatinine Ratio: 23 (ref 12–28)
BUN: 18 mg/dL (ref 8–27)
CALCIUM: 10.3 mg/dL (ref 8.7–10.3)
CHLORIDE: 99 mmol/L (ref 96–106)
CO2: 29 mmol/L (ref 18–29)
Creatinine, Ser: 0.77 mg/dL (ref 0.57–1.00)
GFR calc Af Amer: 85 (ref >59)
GFR calc non Af Amer: 74 (ref >59)
GLUCOSE: 76 mg/dL (ref 65–99)
Globulin, Total: 3 (ref 1.5–4.5)
POTASSIUM: 4.2 mmol/L (ref 3.5–5.2)
Sodium: 141 mmol/L (ref 134–144)
TOTAL PROTEIN: 7 g/dL (ref 6.0–8.5)

## 2016-06-25 LAB — CBC WITH DIFFERENTIAL/PLATELET
BASOS ABS: 0 10*3/uL (ref 0.0–0.2)
Basos: 0 %
EOS (ABSOLUTE): 0.2 10*3/uL (ref 0.0–0.4)
Eos: 3 %
HEMOGLOBIN: 12.7 g/dL (ref 11.1–15.9)
Hematocrit: 39 % (ref 34.0–46.6)
IMMATURE GRANS (ABS): 0 10*3/uL (ref 0.0–0.1)
IMMATURE GRANULOCYTES: 0 %
LYMPHS: 42 %
Lymphocytes Absolute: 2.5 10*3/uL (ref 0.7–3.1)
MCH: 29.3 pg (ref 26.6–33.0)
MCHC: 32.6 g/dL (ref 31.5–35.7)
MCV: 90 fL (ref 79–97)
MONOCYTES: 12 %
Monocytes Absolute: 0.7 10*3/uL (ref 0.1–0.9)
NEUTROS PCT: 43 %
Neutrophils Absolute: 2.6 10*3/uL (ref 1.4–7.0)
Platelets: 299 10*3/uL (ref 150–379)
RBC: 4.34 x10E6/uL (ref 3.77–5.28)
RDW: 14.2 % (ref 12.3–15.4)
WBC: 6 10*3/uL (ref 3.4–10.8)

## 2016-06-25 MED ORDER — IOPAMIDOL (ISOVUE-300) INJECTION 61%
75.0000 mL | Freq: Once | INTRAVENOUS | Status: AC | PRN
  Administered 2016-06-25: 75 mL via INTRAVENOUS

## 2016-06-28 ENCOUNTER — Ambulatory Visit (HOSPITAL_COMMUNITY): Payer: Self-pay | Admitting: Psychiatry

## 2016-06-28 ENCOUNTER — Encounter (HOSPITAL_COMMUNITY): Payer: Self-pay

## 2016-07-01 ENCOUNTER — Ambulatory Visit (INDEPENDENT_AMBULATORY_CARE_PROVIDER_SITE_OTHER): Payer: Medicare Other | Admitting: Psychiatry

## 2016-07-01 DIAGNOSIS — F331 Major depressive disorder, recurrent, moderate: Secondary | ICD-10-CM | POA: Diagnosis not present

## 2016-07-01 NOTE — Progress Notes (Signed)
Alexandria Chen           DOB:                               [birth year]  MR Number:                  [id number]  Location:                        [facility]:  [address] [place],  [place], [address]  Start:                              Thursday 07/01/2016  3:20 PM - End:                               Thursday 07/01/2016  4:00 PM  Provider/Observer:                           Maurice Small, MSW, LCSW   Chief Complaint:                                   Chief Complaint  Patient presents with  . Anxiety    Reason For Service:                                     The patient is a 82 year old African American female who has a long-standing history of recurrent periods of symptoms of depression and anxiety. Patient also has a history of alcohol abuse/dependence and was hospitalized in July 2013 due to relapse and drinking approximately a 6 pack of beer daily along with experiencing increased symptoms of depression and anxiety. This was patient's third psychiatric hospitalization. Patient last was seen in February 2015. She is resuming services today as she reports she resumed alcohol use in November 2017 and has been drinking one can of beer nearly every day since that time to try to calm her nerves. She wants to improve coping skills and avoid going to detox. She reports increased anxiety, excessive worry, memory difficulty,poor motivation, and fatigue.   Interventions Strategy:                    Supportive therapy, cognitive behavioral therapy  Participation Level:                           Active  Participation Quality:                       Appropriate                            Behavioral Observation:                   less fidgety, talkative, cooperative, anxious, confused at times  Current Psychosocial Factors:        Mass on lung was recently discovered, patient's 64 year-old grandson and his 40 year old daughter are residing with patient (stressful as they can  both be  argumentative and don't clean up after themselves per patient's report), concerns about her own health as she has lost her hearing aid, needs glasses, and has some difficulty walking but is going to physical therapy, concerns about youngest daughter who has health issues,    Content of Session:                          Reviewed symptoms, discussed stressors, praised and reinforced patient's increased involvement in activity and efforts to improve self-care, facilitate expression of feelings regarding mass being discovered on lung, discussed using designated worry time and thought stopping techniques to reduce worry,   Current Status:                                  less depressed mood,  Increased anxiety, excessive worry, increased motivation, fatigue    Patient Progress:                              Fair. The patient reports less depressed mood but continued anxiety along with excessive worry. She reports recently learning that a mass is on her lungs and fears bad news. She is scheduled for further testing next week. She says her family tried to explain information to her about the mass but patient states not really wanting to know  what is wrong. However, she reports noticing family is treating her much differently. She also reports having adjustment issues to family providing care for her rather then she providing care for them.  He is pleased she has been involved in more activities and has been going places like out to dinner with her family. She also has tried to do more things at home like light household tasks and word search puzzles.  Target Goals:                        1. Learn and implement behavioral strategies to overcome depression.      2. Learn and implement calming skills.        3. Identify, challenge, and replace negative self talk that evokes anxiety with healthy alternatives.   Last Reviewed:   06/07/2016                                                  Goals  Addressed Today:                 1,2,3  Impression/Diagnosis:                     The patient has a long-standing history of recurrent periods of depression and anxiety.  Her symptoms have included depressed mood, social withdrawal, lack of interest in activities, poor motivation, excessive  worry, feeling jittery, and ruminating thoughts.  Patient also has a history of alcohol dependence . Diagnoses: Maj. depressive disorder, recurrent, moderate; generalized anxiety disorder, alcohol dependence  Diagnosis:                  Axis I:  Major depressive disorder, recurrent, moderate  Generalized anxiety disorder  Axis II: No diagnosis        Alexandria Egelhoff, LCSW 07/01/2016

## 2016-07-02 ENCOUNTER — Encounter: Payer: Self-pay | Admitting: *Deleted

## 2016-07-06 ENCOUNTER — Encounter (HOSPITAL_COMMUNITY): Payer: Self-pay

## 2016-07-06 ENCOUNTER — Emergency Department (HOSPITAL_COMMUNITY): Payer: Medicare Other

## 2016-07-06 ENCOUNTER — Emergency Department (HOSPITAL_COMMUNITY)
Admission: EM | Admit: 2016-07-06 | Discharge: 2016-07-06 | Disposition: A | Discharge status: Home / Self Care | Payer: Medicare Other | Attending: Emergency Medicine | Admitting: Emergency Medicine

## 2016-07-06 DIAGNOSIS — R519 Headache, unspecified: Secondary | ICD-10-CM

## 2016-07-06 DIAGNOSIS — Z7982 Long term (current) use of aspirin: Secondary | ICD-10-CM | POA: Insufficient documentation

## 2016-07-06 DIAGNOSIS — F17210 Nicotine dependence, cigarettes, uncomplicated: Secondary | ICD-10-CM | POA: Diagnosis not present

## 2016-07-06 DIAGNOSIS — R51 Headache: Secondary | ICD-10-CM | POA: Insufficient documentation

## 2016-07-06 DIAGNOSIS — I10 Essential (primary) hypertension: Secondary | ICD-10-CM | POA: Insufficient documentation

## 2016-07-06 DIAGNOSIS — R52 Pain, unspecified: Secondary | ICD-10-CM | POA: Diagnosis not present

## 2016-07-06 DIAGNOSIS — Z79899 Other long term (current) drug therapy: Secondary | ICD-10-CM | POA: Diagnosis not present

## 2016-07-06 DIAGNOSIS — R42 Dizziness and giddiness: Secondary | ICD-10-CM | POA: Diagnosis not present

## 2016-07-06 DIAGNOSIS — G4489 Other headache syndrome: Secondary | ICD-10-CM | POA: Diagnosis not present

## 2016-07-06 MED ORDER — METOCLOPRAMIDE HCL 5 MG/ML IJ SOLN
10.0000 mg | Freq: Once | INTRAMUSCULAR | Status: AC
  Administered 2016-07-06: 10 mg via INTRAVENOUS
  Filled 2016-07-06: qty 2

## 2016-07-06 MED ORDER — SODIUM CHLORIDE 0.9 % IV BOLUS (SEPSIS)
1000.0000 mL | Freq: Once | INTRAVENOUS | Status: AC
Start: 2016-07-06 — End: 2016-07-06
  Administered 2016-07-06: 1000 mL via INTRAVENOUS

## 2016-07-06 MED ORDER — TRAMADOL HCL 50 MG PO TABS: 50.0000 mg | ORAL_TABLET | Freq: Two times a day (BID) | ORAL | 0 refills | Status: DC | PRN

## 2016-07-06 MED ORDER — DIPHENHYDRAMINE HCL 50 MG/ML IJ SOLN
25.0000 mg | Freq: Once | INTRAMUSCULAR | Status: AC
  Administered 2016-07-06: 25 mg via INTRAVENOUS
  Filled 2016-07-06: qty 1

## 2016-07-06 NOTE — ED Provider Notes (Signed)
8:47 AM Pt reassessed. Pt reports she is still having a HA, but it has improved with meds. Daughter at bedside. Extensive discussion with her. Pt not fully aware of recent diagnosis and upcoming testing/consultation. CT not most sensitive modality for small lesions, but no acute abnormalities noted. PRN pain meds. PCP FU for further symptoms.    Virgel Manifold, MD 07/06/16 [phone]

## 2016-07-06 NOTE — ED Notes (Signed)
Pt verbalized understanding of discharge instructions and denies any further questions at this time.   

## 2016-07-06 NOTE — ED Provider Notes (Signed)
[facility] DEPT Provider Note   CSN: [id number] Arrival date & time: 07/06/16  0421     History   Chief Complaint Chief Complaint  Patient presents with  . Headache    HPI Alexandria Chen is a 82 y.o. female.  She woke up this morning with a severe right frontal headache which seems to radiate behind her right eye. She is a very poor historian and cannot characterize the pain at all. Nothing seems to make it better nothing to make it worse but she tried to go back to sleep, but was unable to do so. She denies any visual disturbance, nausea, vomiting. She has not had a headache like this before. Of note, she recently was diagnosed with lung cancer and is scheduled to have a PET scan.   The history is provided by the patient.  Headache      Past Medical History:  Diagnosis Date  . Alcohol abuse, in remission   . Anxiety   . Depression   . HTN (hypertension)   . Osteopenia     Patient Active Problem List   Diagnosis Date Noted  . Syncope and collapse 04/14/2015  . Otitis externa of right ear 12/18/2014  . Hearing loss due to cerumen impaction 12/18/2014  . Essential hypertension, benign 01/14/2014  . Hyperlipidemia 01/14/2014  . Vitamin D deficiency 01/14/2014  . Alcohol dependence in remission ([place]) 10/15/2011    Class: Acute  . Depression 08/31/2011  . Anxiety 08/31/2011    History reviewed. No pertinent surgical history.  OB History    No data available       Home Medications    Prior to Admission medications   Medication Sig Start Date End Date Taking? Authorizing Provider  aspirin 81 MG tablet Take 1 tablet (81 mg total) by mouth daily. For platelet aggregation. 10/19/11   Ruben Im, PA-C  atorvastatin (LIPITOR) 40 MG tablet TAKE 1 TABLET (40 MG TOTAL) BY MOUTH DAILY. FOR HYPERLIPIDEMIA 05/29/16   [name], FNP  busPIRone (BUSPAR) 15 MG tablet Take 1 tablet (15 mg total) by mouth 2 (two) times daily. 05/17/16   Cloria Spring, MD  Calcium  Carb-Cholecalciferol (CALCIUM 500+D3) 500-400 MG-UNIT TABS Take 1 tablet by mouth 2 (two) times daily.    Historical Provider, MD  DULoxetine (CYMBALTA) 60 MG capsule Take 1 capsule (60 mg total) by mouth daily. 05/17/16 05/17/17  Cloria Spring, MD  fish oil-omega-3 fatty acids 1000 MG capsule Take 3 capsules (3 g total) by mouth daily. For hyperlipidemia. 10/19/11   Ruben Im, PA-C  fluticasone (FLONASE) 50 MCG/ACT nasal spray Place 2 sprays into both nostrils 2 (two) times daily. Use as needed 11/05/15   [name], FNP  gabapentin (NEURONTIN) 100 MG capsule Take 1 capsule (100 mg total) by mouth at bedtime. 05/17/16   Cloria Spring, MD  lisinopril (PRINIVIL,ZESTRIL) 10 MG tablet Take 1 tablet (10 mg total) by mouth daily. 10/21/15   [name], FNP  meclizine (ANTIVERT) 25 MG tablet TAKE 0.5 TABLETS (12.5 MG TOTAL) BY MOUTH 2 (TWO) TIMES DAILY AS NEEDED. Patient not taking: Reported on 06/24/2016 03/15/16   [name], FNP  Vitamin D, Cholecalciferol, 400 UNITS CAPS Take 400 Units by mouth daily.    Historical Provider, MD    Family History Family History  Problem Relation Age of Onset  . Anxiety disorder Mother   . Dementia Mother   . Hypertension Mother   . Alcohol abuse  Father   . Arthritis Father   . Alcohol abuse Brother   . ADD / ADHD Neg Hx   . Drug abuse Neg Hx   . Bipolar disorder Neg Hx   . Depression Neg Hx   . OCD Neg Hx   . Paranoid behavior Neg Hx   . Schizophrenia Neg Hx   . Seizures Neg Hx   . Sexual abuse Neg Hx   . Physical abuse Neg Hx     Social History Social History  Substance Use Topics  . Smoking status: Current Every Day Smoker    Packs/day: 1.00    Years: 50.00    Types: Cigarettes    Start date: 05/03/1956  . Smokeless tobacco: Never Used     Comment: about a packa day as of 10/17/2012  . Alcohol use No     Comment: Patient reports no alcohol use since 05/24/2016.     Allergies   Patient has no known allergies.   Review of  Systems Review of Systems  Neurological: Positive for headaches.  All other systems reviewed and are negative.    Physical Exam Updated Vital Signs BP 179/81 (BP Location: Left Arm)   Pulse 67   Temp 98.4 F (36.9 C) (Oral)   Resp 18   Ht '5\' 2"'$  (1.575 m)   Wt 125 lb (56.7 kg)   SpO2 97%   BMI 22.86 kg/m   Physical Exam  Nursing note and vitals reviewed.  82 year old female, resting comfortably and in no acute distress. Vital signs are significant for hypertension. Oxygen saturation is 97%, which is normal. Head is normocephalic and atraumatic. PERRLA, EOMI. Oropharynx is clear. Temporal artery is not palpable. There is no sinus tenderness. Neck is nontender and supple without adenopathy or JVD. Back is nontender and there is no CVA tenderness. Lungs are clear without rales, wheezes, or rhonchi. Chest is nontender. Heart has regular rate and rhythm without murmur. Abdomen is soft, flat, nontender without masses or hepatosplenomegaly and peristalsis is normoactive. Extremities have no cyanosis or edema, full range of motion is present. Skin is warm and dry without rash. Neurologic: Mental status is normal, cranial nerves are intact, there are no motor or sensory deficits.  ED Treatments / Results   Radiology Ct Head Wo Contrast  Result Date: 07/06/2016 CLINICAL DATA:  Headache.  History of lung cancer EXAM: CT HEAD WITHOUT CONTRAST TECHNIQUE: Contiguous axial images were obtained from the base of the skull through the vertex without intravenous contrast. COMPARISON:  05/02/2015 brain MRI FINDINGS: Brain: No evidence of acute infarction, hemorrhage, hydrocephalus, extra-axial collection or mass lesion/mass effect. Advanced chronic microvascular disease with confluent white matter gliosis in the cerebral white matter and multiple remote lacunar infarcts in the deep gray nuclei and deep white matter tracts. Small remote left cerebellar infarct. Vascular: Atherosclerosis.  No  hyperdense vessel. Skull: Normal. Negative for fracture or focal lesion. Sinuses/Orbits: No acute finding. IMPRESSION: 1. No acute finding. 2. Advanced chronic microvascular disease. Electronically Signed   By: Monte Fantasia M.D.   On: 07/06/2016 07:22    Procedures Procedures (including critical care time)  Medications Ordered in ED Medications  sodium chloride 0.9 % bolus 1,000 mL (not administered)  metoCLOPramide (REGLAN) injection 10 mg (not administered)  diphenhydrAMINE (BENADRYL) injection 25 mg (not administered)     Initial Impression / Assessment and Plan / ED Course  I have reviewed the triage vital signs and the nursing notes.  Pertinent imaging results that were available  during my care of the patient were reviewed by me and considered in my medical decision making (see chart for details).  Headache in patient recently diagnosed with lung cancer. Old records are reviewed, and CT scan of the chest on thyroid 23rd showed a large right upper lobe mass with invasion of mediastinum and suggestion of metastatic disease. She will be sent for CT of the head. Will also give migraine cocktail of normal saline, diphenhydramine, metoclopramide.  CT of head shows no evidence of metastatic disease or other acute process. She is showing some improvement in her headache, but has not had sufficient time to allow full effect from headache cocktail. Case is signed out to Dr. Wilson Singer.  Final Clinical Impressions(s) / ED Diagnoses   Final diagnoses:  Headache, unspecified headache type    New Prescriptions New Prescriptions   No medications on file     Delora Fuel, MD 24/49/75 [id number]

## 2016-07-06 NOTE — ED Triage Notes (Addendum)
Pt reports headache starting at 0100 this morning before she went to sleep. Pt is complaining of right sided pressure that goes behind her eye. Denies weakness, blurred vision, or photosensitivity. P endorses some dizziness but states she has been going to therapy for this for a while. Pt A&O in triage. Normal neuro assessment

## 2016-07-07 ENCOUNTER — Encounter (HOSPITAL_COMMUNITY)
Admission: RE | Admit: 2016-07-07 | Discharge: 2016-07-07 | Disposition: A | Discharge status: Home / Self Care | Payer: Medicare Other | Source: Ambulatory Visit | Attending: Family | Admitting: Family

## 2016-07-07 DIAGNOSIS — R918 Other nonspecific abnormal finding of lung field: Secondary | ICD-10-CM | POA: Diagnosis not present

## 2016-07-07 DIAGNOSIS — C3490 Malignant neoplasm of unspecified part of unspecified bronchus or lung: Secondary | ICD-10-CM | POA: Diagnosis not present

## 2016-07-07 LAB — GLUCOSE, CAPILLARY: Glucose-Capillary: 92 mg/dL (ref 65–99)

## 2016-07-07 MED ORDER — FLUDEOXYGLUCOSE F - 18 (FDG) INJECTION: 6.3000 | Freq: Once | INTRAVENOUS | Status: DC | PRN

## 2016-07-08 ENCOUNTER — Ambulatory Visit (HOSPITAL_COMMUNITY): Payer: Self-pay | Admitting: Psychiatry

## 2016-07-09 ENCOUNTER — Other Ambulatory Visit: Payer: Self-pay | Admitting: Family

## 2016-07-10 DIAGNOSIS — I248 Other forms of acute ischemic heart disease: Secondary | ICD-10-CM | POA: Diagnosis not present

## 2016-07-10 DIAGNOSIS — I081 Rheumatic disorders of both mitral and tricuspid valves: Secondary | ICD-10-CM | POA: Diagnosis present

## 2016-07-10 DIAGNOSIS — I088 Other rheumatic multiple valve diseases: Secondary | ICD-10-CM | POA: Diagnosis not present

## 2016-07-10 DIAGNOSIS — R61 Generalized hyperhidrosis: Secondary | ICD-10-CM | POA: Diagnosis not present

## 2016-07-10 DIAGNOSIS — R112 Nausea with vomiting, unspecified: Secondary | ICD-10-CM | POA: Diagnosis present

## 2016-07-10 DIAGNOSIS — F329 Major depressive disorder, single episode, unspecified: Secondary | ICD-10-CM | POA: Diagnosis not present

## 2016-07-10 DIAGNOSIS — R358 Other polyuria: Secondary | ICD-10-CM | POA: Diagnosis not present

## 2016-07-10 DIAGNOSIS — Z9889 Other specified postprocedural states: Secondary | ICD-10-CM | POA: Diagnosis not present

## 2016-07-10 DIAGNOSIS — R918 Other nonspecific abnormal finding of lung field: Secondary | ICD-10-CM | POA: Diagnosis not present

## 2016-07-10 DIAGNOSIS — R7989 Other specified abnormal findings of blood chemistry: Secondary | ICD-10-CM | POA: Diagnosis not present

## 2016-07-10 DIAGNOSIS — E876 Hypokalemia: Secondary | ICD-10-CM | POA: Diagnosis not present

## 2016-07-10 DIAGNOSIS — I517 Cardiomegaly: Secondary | ICD-10-CM | POA: Diagnosis not present

## 2016-07-10 DIAGNOSIS — E785 Hyperlipidemia, unspecified: Secondary | ICD-10-CM | POA: Diagnosis not present

## 2016-07-10 DIAGNOSIS — R197 Diarrhea, unspecified: Secondary | ICD-10-CM | POA: Diagnosis not present

## 2016-07-10 DIAGNOSIS — Z7982 Long term (current) use of aspirin: Secondary | ICD-10-CM | POA: Diagnosis not present

## 2016-07-10 DIAGNOSIS — I4891 Unspecified atrial fibrillation: Secondary | ICD-10-CM | POA: Diagnosis not present

## 2016-07-10 DIAGNOSIS — R000 Tachycardia, unspecified: Secondary | ICD-10-CM | POA: Diagnosis not present

## 2016-07-10 DIAGNOSIS — R0602 Shortness of breath: Secondary | ICD-10-CM | POA: Diagnosis not present

## 2016-07-10 DIAGNOSIS — K30 Functional dyspepsia: Secondary | ICD-10-CM | POA: Diagnosis not present

## 2016-07-10 DIAGNOSIS — I998 Other disorder of circulatory system: Secondary | ICD-10-CM | POA: Diagnosis not present

## 2016-07-10 DIAGNOSIS — I10 Essential (primary) hypertension: Secondary | ICD-10-CM | POA: Diagnosis not present

## 2016-07-10 DIAGNOSIS — F17210 Nicotine dependence, cigarettes, uncomplicated: Secondary | ICD-10-CM | POA: Diagnosis present

## 2016-07-10 DIAGNOSIS — I481 Persistent atrial fibrillation: Secondary | ICD-10-CM | POA: Diagnosis not present

## 2016-07-10 DIAGNOSIS — F419 Anxiety disorder, unspecified: Secondary | ICD-10-CM | POA: Diagnosis present

## 2016-07-12 ENCOUNTER — Telehealth (HOSPITAL_COMMUNITY): Payer: Self-pay | Admitting: *Deleted

## 2016-07-12 NOTE — Telephone Encounter (Signed)
voice message from Sheliah Plane to cancel appointment.   The patient is inpatient at [facility].

## 2016-07-13 ENCOUNTER — Encounter (HOSPITAL_COMMUNITY): Payer: Self-pay | Admitting: Oncology

## 2016-07-13 DIAGNOSIS — C3491 Malignant neoplasm of unspecified part of right bronchus or lung: Secondary | ICD-10-CM | POA: Insufficient documentation

## 2016-07-13 DIAGNOSIS — R918 Other nonspecific abnormal finding of lung field: Secondary | ICD-10-CM

## 2016-07-13 HISTORY — DX: Malignant neoplasm of unspecified part of right bronchus or lung: C34.91

## 2016-07-13 HISTORY — DX: Other nonspecific abnormal finding of lung field: R91.8

## 2016-07-13 NOTE — Assessment & Plan Note (Deleted)
Right lung mass invading into the mediastinum (T4) with PET-CT demonstrating mild hypermetabolic activity of right hilar and paratracheal/mediastinal (N0-N2?) suspicious for primary bronchogenic carcinoma.  Labs today: CBC diff, CMET.  I personally reviewed and went over laboratory results with the patient.  The results are noted within this dictation.  I personally reviewed and went over radiographic studies with the patient.  The results are noted within this dictation.  Results from CT chest, CT head, and PET scan are reviewed with the patient.  She has a right lung mass invading the mediastinum (T4) and some mild hypermetabolic activity of right hilum and right mediastinum/paratrachea region.  CT of head was performed in the ED for headache.  This exam was also performed without contrast.  Staging based upon PET imaging is at least a Stage IIIA (versus Stage IIIB).  Nodal status is the differentiating factor for staging.  No evidence of metastatic disease.  Given CT imaging of head wo contrast is suboptimal for staging, order is placed for MRI brain w and wo contrast.  I have sent a message to Dr. Roxan Hockey, CTS, regarding this patient and best plan of action to ascertain biopsy for diagnosis and staging of lymph nodes.  Based upon current data, she has Stage III disease.  Presuming NSCLC, she would benefit from chemoXRT followed by consolidative immunotherapy x 52 weeks.  HOWEVER, biopsy to confirm suspicion is required prior to recommending treatment course.  She will return following biopsy for further medical oncology recommendations.

## 2016-07-13 NOTE — Progress Notes (Deleted)
[facility] Hematology/Oncology Consultation   Name: Alexandria Chen      MRN: [id number]    Location: Room/bed info not found  Date: 07/13/2016 Time:5:04 PM   REFERRING PHYSICIAN:  Evelina Dun, FNP (Primary Care Provider)  REASON FOR CONSULT:  Right lung mass   DIAGNOSIS:  Right lung mass invading into the mediastinum (T4) with PET-CT demonstrating mild hypermetabolic activity of right hilar and paratracheal/mediastinal (N0-N2?) suspicious for primary bronchogenic carcinoma.  HISTORY OF PRESENT ILLNESS:  *** Alexandria Chen is a 82 y.o. female with a medical history significant for vitamin D deficiency, hyperlipidemia, HTN, depression, anxiety, H/O EtOHism who is referred to the [facility] for right lung mass.   Review of Systems - {ros master:310782}  PAST MEDICAL HISTORY:   Past Medical History:  Diagnosis Date  . Alcohol abuse, in remission   . Anxiety   . Depression   . HTN (hypertension)   . Osteopenia     ALLERGIES: No Known Allergies    MEDICATIONS: {medication reviewed/display:3041432}    Current Outpatient Prescriptions on File Prior to Visit  Medication Sig Dispense Refill  . aspirin 81 MG tablet Take 1 tablet (81 mg total) by mouth daily. For platelet aggregation. 30 tablet   . atorvastatin (LIPITOR) 40 MG tablet TAKE 1 TABLET (40 MG TOTAL) BY MOUTH DAILY. FOR HYPERLIPIDEMIA 90 tablet 1  . busPIRone (BUSPAR) 15 MG tablet Take 1 tablet (15 mg total) by mouth 2 (two) times daily. 180 tablet 2  . Calcium Carb-Cholecalciferol (CALCIUM 500+D3) 500-400 MG-UNIT TABS Take 1 tablet by mouth 2 (two) times daily.    . DULoxetine (CYMBALTA) 60 MG capsule Take 1 capsule (60 mg total) by mouth daily. 30 capsule 2  . fish oil-omega-3 fatty acids 1000 MG capsule Take 3 capsules (3 g total) by mouth daily. For hyperlipidemia. 30 capsule   . FLUoxetine (PROZAC) 20 MG capsule Take 40 mg by mouth daily.    . fluticasone (FLONASE) 50 MCG/ACT nasal spray  Place 2 sprays into both nostrils 2 (two) times daily. Use as needed (Patient taking differently: Place 2 sprays into both nostrils 2 (two) times daily as needed for allergies. Use as needed) 16 g 6  . gabapentin (NEURONTIN) 100 MG capsule Take 1 capsule (100 mg total) by mouth at bedtime. 90 capsule 2  . lisinopril (PRINIVIL,ZESTRIL) 10 MG tablet Take 1 tablet (10 mg total) by mouth daily. 90 tablet 3  . meclizine (ANTIVERT) 25 MG tablet TAKE 0.5 TABLETS (12.5 MG TOTAL) BY MOUTH 2 (TWO) TIMES DAILY AS NEEDED. (Patient taking differently: TAKE 0.5 TABLETS (12.5 MG TOTAL) BY MOUTH 2 (TWO) TIMES DAILY AS NEEDED FOR DIZZYNESS) 20 tablet 2  . traMADol (ULTRAM) 50 MG tablet Take 1 tablet (50 mg total) by mouth every 12 (twelve) hours as needed. 8 tablet 0  . Vitamin D, Cholecalciferol, 400 UNITS CAPS Take 400 Units by mouth daily.     No current facility-administered medications on file prior to visit.      PAST SURGICAL HISTORY No past surgical history on file.  FAMILY HISTORY: Family History  Problem Relation Age of Onset  . Anxiety disorder Mother   . Dementia Mother   . Hypertension Mother   . Alcohol abuse Father   . Arthritis Father   . Alcohol abuse Brother   . ADD / ADHD Neg Hx   . Drug abuse Neg Hx   . Bipolar disorder Neg Hx   .  Depression Neg Hx   . OCD Neg Hx   . Paranoid behavior Neg Hx   . Schizophrenia Neg Hx   . Seizures Neg Hx   . Sexual abuse Neg Hx   . Physical abuse Neg Hx     SOCIAL HISTORY:  reports that she has been smoking Cigarettes.  She started smoking about 60 years ago. She has a 50.00 pack-year smoking history. She has never used smokeless tobacco. She reports that she does not drink alcohol or use drugs.  Social History   Social History  . Marital status: Widowed    Spouse name: N/A  . Number of children: N/A  . Years of education: N/A   Social History Main Topics  . Smoking status: Current Every Day Smoker    Packs/day: 1.00    Years: 50.00      Types: Cigarettes    Start date: 05/03/1956  . Smokeless tobacco: Never Used     Comment: about a packa day as of 10/17/2012  . Alcohol use No     Comment: Patient reports no alcohol use since 05/24/2016.  . Drug use: No  . Sexual activity: No   Other Topics Concern  . Not on file   Social History Narrative  . No narrative on file    PERFORMANCE STATUS: The patient's performance status is {CHL ONC ECOG NG:[id number]}  PHYSICAL EXAM: Most Recent Vital Signs: There were no vitals taken for this visit. {Exam, Complete:17964}  LABORATORY DATA:  No results found for this or any previous visit (from the past 48 hour(s)).    RADIOGRAPHY: CLINICAL DATA:  Initial treatment strategy for lung cancer.  EXAM: NUCLEAR MEDICINE PET SKULL BASE TO THIGH  TECHNIQUE: 6.3 mCi F-18 FDG was injected intravenously. Full-ring PET imaging was performed from the skull base to thigh after the radiotracer. CT data was obtained and used for attenuation correction and anatomic localization.  FASTING BLOOD GLUCOSE:  Value: 92 mg/dl  COMPARISON:  CT chest dated 06/25/2016  FINDINGS: NECK  No suspicious cervical lymphadenopathy.  CHEST  4.7 cm mass in the medial right upper lobe abutting/invading the mediastinum (series 4/image 50), max SUV 17.5, suspicious for primary bronchogenic neoplasm.  Associated 9 mm lower right paratracheal node (series 4/image 50) with mild hypermetabolism, max SUV 3.4, indeterminate.  Mild hypermetabolism in the right hilar region, max SUV 4.0, poorly evaluated on unenhanced CT.  Underlying mild paraseptal emphysematous changes, upper lobe predominant.  ABDOMEN/PELVIS  No abnormal hypermetabolic activity within the liver, pancreas, adrenal glands, or spleen.  3.6 x 2.3 cm subcapsular hypodense lesion along the lateral aspect of segment 5 (series 4/image 95), non FDG avid, likely reflecting a benign hemangioma when correlating with prior  CT.  No hypermetabolic lymph nodes in the abdomen or pelvis.  SKELETON  No focal hypermetabolic activity to suggest skeletal metastasis.  IMPRESSION: 4.7 cm mass in the medial right upper lobe abutting/invading the mediastinum, suspicious for primary bronchogenic neoplasm.  Possible mild mediastinal/right hilar nodal metastases, equivocal.  No evidence of distant metastases.  3.6 x 2.3 cm subcapsular lesion in the inferior right liver is non FDG avid, likely reflecting a benign hemangioma when correlating with prior CT.   Electronically Signed   By: Julian Hy M.D.   On: 07/07/2016 14:13   CLINICAL DATA:  Headache.  History of lung cancer  EXAM: CT HEAD WITHOUT CONTRAST  TECHNIQUE: Contiguous axial images were obtained from the base of the skull through the vertex without intravenous contrast.  COMPARISON:  05/02/2015 brain MRI  FINDINGS: Brain: No evidence of acute infarction, hemorrhage, hydrocephalus, extra-axial collection or mass lesion/mass effect.  Advanced chronic microvascular disease with confluent white matter gliosis in the cerebral white matter and multiple remote lacunar infarcts in the deep gray nuclei and deep white matter tracts. Small remote left cerebellar infarct.  Vascular: Atherosclerosis.  No hyperdense vessel.  Skull: Normal. Negative for fracture or focal lesion.  Sinuses/Orbits: No acute finding.  IMPRESSION: 1. No acute finding. 2. Advanced chronic microvascular disease.   Electronically Signed   By: Monte Fantasia M.D.   On: 07/06/2016 07:22   CLINICAL DATA:  82 year old female with abnormal chest x-ray concerning for lung cancer. Followup study.  EXAM: CT CHEST WITH CONTRAST  TECHNIQUE: Multidetector CT imaging of the chest was performed during intravenous contrast administration.  CONTRAST:  56m ISOVUE-300 IOPAMIDOL (ISOVUE-300) INJECTION 61%  COMPARISON:  No prior chest CT.  Chest  x-ray 06/24/2016.  FINDINGS: Cardiovascular: Heart size is mildly enlarged. There is no significant pericardial fluid, thickening or pericardial calcification. There is aortic atherosclerosis, as well as atherosclerosis of the great vessels of the mediastinum and the coronary arteries, including calcified atherosclerotic plaque in the left main, left anterior descending and left circumflex coronary arteries.  Mediastinum/Nodes: Multiple enlarged right hilar and mediastinal lymph nodes. The largest of these include a 1.3 cm right hilar lymph node (image 59 of series 2) and a 1.4 cm low right paratracheal lymph node (image 54 of series 2). No contralateral mediastinal or left hilar lymphadenopathy. Esophagus is unremarkable in appearance. No axillary lymphadenopathy.  Lungs/Pleura: Large mass in the medial aspect of the right upper lobe measuring 4.9 x 3.3 cm (axial image 53 of series 2). This makes a broad contact with the pleura adjacent to the mediastinum, obscuring the intervening fat plane and slightly distorting the adjacent superior vena cava, which could indicate very early invasion (not yet definitive). In the surrounding lung parenchyma distal to the mass there are areas of septal thickening, which may reflect postobstructive changes or surrounding lymphangitic spread of disease. 4 mm pleural-based nodule associated with the minor fissure is nonspecific, but favored to represent a subpleural lymph node. No acute consolidative airspace disease. No pleural effusions. Patchy areas of peripheral predominant ground-glass attenuation are also noted throughout the lung bases bilaterally. Mild diffuse bronchial wall thickening with mild centrilobular and paraseptal emphysema.  Upper Abdomen: Incompletely visualized lesion in the right lobe of the liver which demonstrates some peripheral nodular enhancement, favored to represent a cavernous hemangioma (but not  characterized) measuring at least 3.8 x 2.3 cm (image 146 of series 2). Incompletely visualized low-attenuation lesion measuring at least 2.1 x 3.3 cm in the anterior aspect of the interpolar region of the left kidney is not characterized, but likely a large cyst. Several other tiny subcentimeter low-attenuation lesions also noted in the kidneys bilaterally, also incompletely characterized due to their small size, but favored to represent tiny cysts. Multiple nodules associated with the adrenal glands bilaterally, incompletely characterized on today's contrast enhanced examination, largest of which measures up to 1.4 cm on the left. Aortic atherosclerosis.  Musculoskeletal: There are no aggressive appearing lytic or blastic lesions noted in the visualized portions of the skeleton.  IMPRESSION: 1. 4.9 x 3.3 cm mass in the medial aspect of the right upper lobe which demonstrates evidence of potential direct mediastinal invasion, and pathologically enlarged right hilar and right paratracheal lymph nodes which suggest nodal metastatic disease. These findings are highly concerning for primary bronchogenic carcinoma  and further evaluation with PET-CT is strongly recommended in the near future for further diagnostic and staging purposes. 2. There is also nodularity in the adrenal glands bilaterally, which could reflect additional sites of metastatic disease. Attention at time of forthcoming PET-CT is recommended. 3. Indeterminate lesion in the right lobe of the liver, favored to represent a cavernous hemangioma. Should this lesion prove to be hypermetabolic on PET imaging, further characterization with MRI of the abdomen with and without IV gadolinium could provide definitive characterization. 4. Aortic atherosclerosis, in addition to left main and 2 vessel coronary artery disease. Assessment for potential risk factor modification, dietary therapy or pharmacologic therapy may  be warranted, if clinically indicated. 5. Mild diffuse bronchial wall thickening with mild centrilobular and paraseptal emphysema; imaging findings suggestive of underlying COPD. 6. Additional incidental findings, as above.   Electronically Signed   By: Vinnie Langton M.D.   On: 06/25/2016 15:43    PATHOLOGY:  N/A at this time.  ASSESSMENT/PLAN: ***  No problem-specific Assessment & Plan notes found for this encounter.   ORDERS PLACED FOR THIS ENCOUNTER: No orders of the defined types were placed in this encounter.   MEDICATIONS PRESCRIBED THIS ENCOUNTER: No orders of the defined types were placed in this encounter.   All questions were answered. The patient knows to call the clinic with any problems, questions or concerns. We can certainly see the patient much sooner if necessary.  Patient discussed with Dr. Talbert Cage and together we ascertained an up-to-date interval history, and examined the patient.  Dr. Talbert Cage developed the patient's assessment and plan.  This was a shared visit-consultation.  Her attestation will follow below.  This note is electronically signed by: Doy Mince 07/13/2016 5:04 PM

## 2016-07-14 ENCOUNTER — Ambulatory Visit (HOSPITAL_COMMUNITY): Payer: Self-pay | Admitting: Oncology

## 2016-07-14 ENCOUNTER — Ambulatory Visit (HOSPITAL_COMMUNITY): Payer: Self-pay | Admitting: Psychiatry

## 2016-07-16 ENCOUNTER — Ambulatory Visit (HOSPITAL_COMMUNITY): Payer: Self-pay | Admitting: Psychiatry

## 2016-07-18 ENCOUNTER — Other Ambulatory Visit (HOSPITAL_COMMUNITY): Payer: Self-pay | Admitting: Psychiatry

## 2016-07-19 ENCOUNTER — Encounter: Payer: Self-pay | Admitting: Family

## 2016-07-19 ENCOUNTER — Ambulatory Visit (INDEPENDENT_AMBULATORY_CARE_PROVIDER_SITE_OTHER): Payer: Medicare Other | Admitting: Family

## 2016-07-19 VITALS — BP 137/76 | HR 63 | Temp 98.2°F | Ht 62.0 in | Wt 131.2 lb

## 2016-07-19 DIAGNOSIS — Z09 Encounter for follow-up examination after completed treatment for conditions other than malignant neoplasm: Secondary | ICD-10-CM | POA: Diagnosis not present

## 2016-07-19 DIAGNOSIS — I4891 Unspecified atrial fibrillation: Secondary | ICD-10-CM | POA: Diagnosis not present

## 2016-07-19 DIAGNOSIS — R918 Other nonspecific abnormal finding of lung field: Secondary | ICD-10-CM | POA: Diagnosis not present

## 2016-07-19 MED ORDER — WARFARIN SODIUM 2.5 MG PO TABS: 2.5000 mg | ORAL_TABLET | Freq: Every day | ORAL | 3 refills | Status: DC

## 2016-07-19 NOTE — Patient Instructions (Signed)
Atrial Fibrillation Atrial fibrillation is a type of irregular or rapid heartbeat (arrhythmia). In atrial fibrillation, the heart quivers continuously in a chaotic pattern. This occurs when parts of the heart receive disorganized signals that make the heart unable to pump blood normally. This can increase the risk for stroke, heart failure, and other heart-related conditions. There are different types of atrial fibrillation, including:  Paroxysmal atrial fibrillation. This type starts suddenly, and it usually stops on its own shortly after it starts.  Persistent atrial fibrillation. This type often lasts longer than a week. It may stop on its own or with treatment.  Long-lasting persistent atrial fibrillation. This type lasts longer than 12 months.  Permanent atrial fibrillation. This type does not go away. Talk with your health care provider to learn about the type of atrial fibrillation that you have. What are the causes? This condition is caused by some heart-related conditions or procedures, including:  A heart attack.  Coronary artery disease.  Heart failure.  Heart valve conditions.  High blood pressure.  Inflammation of the sac that surrounds the heart (pericarditis).  Heart surgery.  Certain heart rhythm disorders, such as Wolf-Parkinson-White syndrome. Other causes include:  Pneumonia.  Obstructive sleep apnea.  Blockage of an artery in the lungs (pulmonary embolism, or PE).  Lung cancer.  Chronic lung disease.  Thyroid problems, especially if the thyroid is overactive (hyperthyroidism).  Caffeine.  Excessive alcohol use or illegal drug use.  Use of some medicines, including certain decongestants and diet pills. Sometimes, the cause cannot be found. What increases the risk? This condition is more likely to develop in:  People who are older in age.  People who smoke.  People who have diabetes mellitus.  People who are overweight (obese).  Athletes  who exercise vigorously. What are the signs or symptoms? Symptoms of this condition include:  A feeling that your heart is beating rapidly or irregularly.  A feeling of discomfort or pain in your chest.  Shortness of breath.  Sudden light-headedness or weakness.  Getting tired easily during exercise. In some cases, there are no symptoms. How is this diagnosed? Your health care provider may be able to detect atrial fibrillation when taking your pulse. If detected, this condition may be diagnosed with:  An electrocardiogram (ECG).  A Holter monitor test that records your heartbeat patterns over a 24-hour period.  Transthoracic echocardiogram (TTE) to evaluate how blood flows through your heart.  Transesophageal echocardiogram (TEE) to view more detailed images of your heart.  A stress test.  Imaging tests, such as a CT scan or chest X-ray.  Blood tests. How is this treated? The main goals of treatment are to prevent blood clots from forming and to keep your heart beating at a normal rate and rhythm. The type of treatment that you receive depends on many factors, such as your underlying medical conditions and how you feel when you are experiencing atrial fibrillation. This condition may be treated with:  Medicine to slow down the heart rate, bring the heart's rhythm back to normal, or prevent clots from forming.  Electrical cardioversion. This is a procedure that resets your heart's rhythm by delivering a controlled, low-energy shock to the heart through your skin.  Different types of ablation, such as catheter ablation, catheter ablation with pacemaker, or surgical ablation. These procedures destroy the heart tissues that send abnormal signals. When the pacemaker is used, it is placed under your skin to help your heart beat in a regular rhythm. Follow these instructions  a regular rhythm.  Follow these instructions at home:  Take over-the counter and prescription medicines only as told by your  health care provider.  If your health care provider prescribed a blood-thinning medicine (anticoagulant), take it exactly as told. Taking too much blood-thinning medicine can cause bleeding. If you do not take enough blood-thinning medicine, you will not have the protection that you need against stroke and other problems.  Do not use tobacco products, including cigarettes, chewing tobacco, and e-cigarettes. If you need help quitting, ask your health care provider.  If you have obstructive sleep apnea, manage your condition as told by your health care provider.  Do not drink alcohol.  Do not drink beverages that contain caffeine, such as coffee, soda, and tea.  Maintain a healthy weight. Do not use diet pills unless your health care provider approves. Diet pills may make heart problems worse.  Follow diet instructions as told by your health care provider.  Exercise regularly as told by your health care provider.  Keep all follow-up visits as told by your health care provider. This is important. How is this prevented?  Avoid drinking beverages that contain caffeine or alcohol.  Avoid certain medicines, especially medicines that are used for breathing problems.  Avoid certain herbs and herbal medicines, such as those that contain ephedra or ginseng.  Do not use illegal drugs, such as cocaine and amphetamines.  Do not smoke.  Manage your high blood pressure. Contact a health care provider if:  You notice a change in the rate, rhythm, or strength of your heartbeat.  You are taking an anticoagulant and you notice increased bruising.  You tire more easily when you exercise or exert yourself. Get help right away if:  You have chest pain, abdominal pain, sweating, or weakness.  You feel nauseous.  You notice blood in your vomit, bowel movement, or urine.  You have shortness of breath.  You suddenly have swollen feet and ankles.  You feel dizzy.  You have sudden weakness or  numbness of the face, arm, or leg, especially on one side of the body.  You have trouble speaking, trouble understanding, or both (aphasia).  Your face or your eyelid droops on one side. These symptoms may represent a serious problem that is an emergency. Do not wait to see if the symptoms will go away. Get medical help right away. Call your local emergency services (911 in the [place] Do not drive yourself to the hospital. This information is not intended to replace advice given to you by your health care provider. Make sure you discuss any questions you have with your health care provider. Document Released: 04/19/2005 Document Revised: 08/27/2015 Document Reviewed: 08/14/2014 Elsevier Interactive Patient Education  2017 [employer]  

## 2016-07-19 NOTE — Progress Notes (Signed)
   Subjective:    Patient ID: Alexandria Chen, female    DOB: Mar 25, 1935, 82 y.o.   MRN: [id number]  HPI Pt presents to the office today for hospital follow up for new onset of A Fib with rapid ventricular rate requiring electrical cardioversion. Pt's CHA2DS2-VASC was 4 and was discharged on Eliqis 5 mg BID and amiodarone. PT has follow up with Cardiologists on 08/20/16. PT was told she could transition to warfarin after 08/16/16 if Eliqis was too costly.   PT has appt with Oncologists 08/29/16 for a new lung mass. Pt has not had lung biopsy yet and her anticoagulation will have to be adjusted when this occurs. Pt has SOB on exertion and nonproductive cough.     Review of Systems  Respiratory: Positive for cough and shortness of breath.   Cardiovascular: Negative for chest pain and palpitations.  Gastrointestinal: Negative.   Genitourinary: Negative.   Musculoskeletal: Positive for arthralgias.  All other systems reviewed and are negative.      Objective:   Physical Exam  Constitutional: She is oriented to person, place, and time. She appears well-developed and well-nourished. No distress.  HENT:  Head: Normocephalic.  Eyes: Pupils are equal, round, and reactive to light.  Neck: Normal range of motion. Neck supple. No thyromegaly present.  Cardiovascular: Normal rate, regular rhythm, normal heart sounds and intact distal pulses.   No murmur heard. Pulmonary/Chest: Effort normal. No respiratory distress. She has decreased breath sounds. She has no wheezes.  Abdominal: Soft. Bowel sounds are normal. She exhibits no distension. There is no tenderness.  Musculoskeletal: Normal range of motion. She exhibits no edema or tenderness.  Neurological: She is alert and oriented to person, place, and time. No cranial nerve deficit.  Skin: Skin is warm and dry.  Psychiatric: She has a normal mood and affect. Her behavior is normal. Judgment and thought content normal.  Vitals reviewed.     BP  137/76   Pulse 63   Temp 98.2 F (36.8 C) (Oral)   Ht _0  (1.575 m)   Wt 131 lb 3.2 oz (59.5 kg)   BMI 24.00 kg/m      Assessment & Plan:  1. Atrial fibrillation, unspecified type ([place]) -Pt to finish Eliquis and then start warfarin 08/16/16  Follow up in [name], Clinical Pharm, 08/24/16 - warfarin (COUMADIN) 2.5 MG tablet; Take 1 tablet (2.5 mg total) by mouth daily.  Dispense: 30 tablet; Refill: 3 - CMP14+EGFR - CBC with Differential/Platelet  2. Hospital discharge follow-up - CMP14+EGFR - CBC with Differential/Platelet  3. Lung mass Keep Oncologists appt   - CMP14+EGFR - CBC with Differential/Platelet  Continue all meds Labs pending Health Maintenance reviewed Diet and exercise encouraged RTO 1 week with [name]   Evelina Dun, FNP

## 2016-07-20 LAB — CMP14+EGFR
ALBUMIN: 3.7 g/dL (ref 3.5–4.8)
ALK PHOS: 74 IU/L (ref 39–117)
ALT: 9 IU/L (ref 0–32)
AST: 13 IU/L (ref 0–40)
Albumin/Globulin Ratio: 1.2 (ref 1.2–2.2)
BUN/Creatinine Ratio: 11 — ABNORMAL LOW (ref 12–28)
BUN: 9 mg/dL (ref 8–27)
Bilirubin Total: 0.3 mg/dL (ref 0.0–1.2)
CALCIUM: 9.6 mg/dL (ref 8.7–10.3)
CO2: 25 mmol/L (ref 18–29)
CREATININE: 0.82 mg/dL (ref 0.57–1.00)
Chloride: 98 mmol/L (ref 96–106)
GFR calc Af Amer: 79 mL/min/{1.73_m2} (ref >59)
GFR, EST NON AFRICAN AMERICAN: 68 mL/min/{1.73_m2} (ref >59)
GLOBULIN, TOTAL: 3.1 g/dL (ref 1.5–4.5)
GLUCOSE: 78 mg/dL (ref 65–99)
Potassium: 3.9 mmol/L (ref 3.5–5.2)
SODIUM: 138 mmol/L (ref 134–144)
Total Protein: 6.8 g/dL (ref 6.0–8.5)

## 2016-07-20 LAB — CBC WITH DIFFERENTIAL/PLATELET
BASOS: 0 %
Basophils Absolute: 0 10*3/uL (ref 0.0–0.2)
EOS (ABSOLUTE): 0.1 10*3/uL (ref 0.0–0.4)
EOS: 1 %
HEMATOCRIT: 35.6 % (ref 34.0–46.6)
HEMOGLOBIN: 12 g/dL (ref 11.1–15.9)
IMMATURE GRANULOCYTES: 0 %
Immature Grans (Abs): 0 10*3/uL (ref 0.0–0.1)
Lymphocytes Absolute: 2.3 10*3/uL (ref 0.7–3.1)
Lymphs: 38 %
MCH: 30 pg (ref 26.6–33.0)
MCHC: 33.7 g/dL (ref 31.5–35.7)
MCV: 89 fL (ref 79–97)
MONOCYTES: 12 %
Monocytes Absolute: 0.7 10*3/uL (ref 0.1–0.9)
NEUTROS PCT: 49 %
Neutrophils Absolute: 2.9 10*3/uL (ref 1.4–7.0)
Platelets: 358 10*3/uL (ref 150–379)
RBC: 4 x10E6/uL (ref 3.77–5.28)
RDW: 14.1 % (ref 12.3–15.4)
WBC: 5.9 10*3/uL (ref 3.4–10.8)

## 2016-07-22 ENCOUNTER — Ambulatory Visit (HOSPITAL_COMMUNITY): Payer: Self-pay | Admitting: Psychiatry

## 2016-07-22 ENCOUNTER — Ambulatory Visit (INDEPENDENT_AMBULATORY_CARE_PROVIDER_SITE_OTHER): Payer: Medicare Other | Admitting: Psychiatry

## 2016-07-22 DIAGNOSIS — F331 Major depressive disorder, recurrent, moderate: Secondary | ICD-10-CM | POA: Diagnosis not present

## 2016-07-22 NOTE — Progress Notes (Signed)
Alexandria Chen           DOB:                               1934-08-23  MR Number:                  [id number]  Location:                        [facility]:  [address] [place],  [place], [address]  Start:                              Thursday 07/22/2016  11:15 AM  End:                               Thursday 07/22/2016  11:59 AM            Provider/Observer:                           Maurice Small, MSW, LCSW   Chief Complaint:                                   Chief Complaint  Patient presents with  . Anxiety    Reason For Service:                                     The patient is a 82 year old African American female who has a long-standing history of recurrent periods of symptoms of depression and anxiety. Patient also has a history of alcohol abuse/dependence and was hospitalized in July 2013 due to relapse and drinking approximately a 6 pack of beer daily along with experiencing increased symptoms of depression and anxiety. This was patient's third psychiatric hospitalization. Patient last was seen in February 2015. She is resuming services today as she reports she resumed alcohol use in November 2017 and has been drinking one can of beer nearly every day since that time to try to calm her nerves. She wants to improve coping skills and avoid going to detox. She reports increased anxiety, excessive worry, memory difficulty,poor motivation, and fatigue.   Interventions Strategy:                    Supportive therapy, cognitive behavioral therapy  Participation Level:                           Active  Participation Quality:                       Appropriate                            Behavioral Observation:                   less fidgety, talkative, cooperative, anxious,   Current Psychosocial Factors:        Mass on lung was recently discovered, patient's 59 year-old grandson and his 66 year old daughter are residing with  patient (stressful as they can both  be argumentative and don't clean up after themselves per patient's report), concerns about her own health as she has lost her hearing aid, needs glasses, and has some difficulty walking but is going to physical therapy, concerns about youngest daughter who has health issues,    Content of Session:                          Reviewed symptoms, discussed stressors, facilitated expression of thoughts and feelings regarding her health, discussed pros and cons of not knowing what is wrong with her versus knowing,  praised and reinforced patient's increased involvement in activity and efforts to improve self-care, facilitate expression of feelings regarding health issues, praised and reinforce patient's use of  designated worry time and thought stopping techniques, discussed effects, discussed rationale for and practiced a mindful breathing technique, assigned patient to practice mindful breathing technique daily  Current Status:                                   depressed mood, anxiety, excessive worry, fatigue    Patient Progress:                              Fair. The patient reports continued anxiety and worry about her health. She reports having several medical tests but still not knowing what is going on with her body. She continues to express ambivalence about knowing what is wrong with her body. She states waking up every morning worrying. However, she does use designated worry time and thought stopping techniques to try to manage. She continues to try to maintain involvement in activities and reports going with children to do errands which helps. However, she is experiencing more fatigue. She states feeling a little depressed because she doesn't feel and can't do like she used to. She reports continued strong support from family.   Target Goals:                        1. Learn and implement behavioral strategies to overcome depression.      2. Learn and implement calming skills.        3. Identify,  challenge, and replace negative self talk that evokes anxiety with healthy alternatives.   Last Reviewed:   06/07/2016                                                  Goals Addressed Today:                 1,2,  Impression/Diagnosis:                     The patient has a long-standing history of recurrent periods of depression and anxiety.  Her symptoms have included depressed mood, social withdrawal, lack of interest in activities, poor motivation, excessive  worry, feeling jittery, and ruminating thoughts.  Patient also has a history of alcohol dependence . Diagnoses: Maj. depressive disorder, recurrent, moderate; generalized anxiety disorder, alcohol dependence  Diagnosis:                  Axis I:  Major  depressive disorder, recurrent, moderate  Generalized anxiety disorder                                                                                                  Axis II: No diagnosis        Alexandria Seim, LCSW 07/22/2016

## 2016-07-26 ENCOUNTER — Ambulatory Visit (HOSPITAL_COMMUNITY): Payer: Self-pay | Admitting: Psychiatry

## 2016-07-29 ENCOUNTER — Encounter (HOSPITAL_COMMUNITY): Payer: Medicare Other

## 2016-07-29 ENCOUNTER — Encounter (HOSPITAL_COMMUNITY): Payer: Self-pay | Admitting: Oncology

## 2016-07-29 ENCOUNTER — Encounter (HOSPITAL_COMMUNITY): Payer: Medicare Other | Attending: Oncology | Admitting: Oncology

## 2016-07-29 DIAGNOSIS — Z7901 Long term (current) use of anticoagulants: Secondary | ICD-10-CM | POA: Diagnosis not present

## 2016-07-29 DIAGNOSIS — I4891 Unspecified atrial fibrillation: Secondary | ICD-10-CM | POA: Diagnosis not present

## 2016-07-29 DIAGNOSIS — R918 Other nonspecific abnormal finding of lung field: Secondary | ICD-10-CM | POA: Insufficient documentation

## 2016-07-29 LAB — COMPREHENSIVE METABOLIC PANEL
ALK PHOS: 67 U/L (ref 38–126)
ALT: 13 U/L — AB (ref 14–54)
AST: 16 U/L (ref 15–41)
Albumin: 3.7 g/dL (ref 3.5–5.0)
Anion gap: 5 (ref 5–15)
BILIRUBIN TOTAL: 0.7 mg/dL (ref 0.3–1.2)
BUN: 12 mg/dL (ref 6–20)
CALCIUM: 9.6 mg/dL (ref 8.9–10.3)
CO2: 30 mmol/L (ref 22–32)
CREATININE: 0.85 mg/dL (ref 0.44–1.00)
Chloride: 102 mmol/L (ref 101–111)
Glucose, Bld: 94 mg/dL (ref 65–99)
Potassium: 3.9 mmol/L (ref 3.5–5.1)
Sodium: 137 mmol/L (ref 135–145)
Total Protein: 7.6 g/dL (ref 6.5–8.1)

## 2016-07-29 LAB — CBC WITH DIFFERENTIAL/PLATELET
Basophils Absolute: 0 10*3/uL (ref 0.0–0.1)
Basophils Relative: 0 %
EOS PCT: 1 %
Eosinophils Absolute: 0.1 10*3/uL (ref 0.0–0.7)
HCT: 37.9 % (ref 36.0–46.0)
HEMOGLOBIN: 12.7 g/dL (ref 12.0–15.0)
LYMPHS ABS: 2.4 10*3/uL (ref 0.7–4.0)
LYMPHS PCT: 39 %
MCH: 30.2 pg (ref 26.0–34.0)
MCHC: 33.5 g/dL (ref 30.0–36.0)
MCV: 90.2 fL (ref 78.0–100.0)
Monocytes Absolute: 0.5 10*3/uL (ref 0.1–1.0)
Monocytes Relative: 8 %
NEUTROS PCT: 52 %
Neutro Abs: 3.3 10*3/uL (ref 1.7–7.7)
Platelets: 313 10*3/uL (ref 150–400)
RBC: 4.2 MIL/uL (ref 3.87–5.11)
RDW: 13.8 % (ref 11.5–15.5)
WBC: 6.3 10*3/uL (ref 4.0–10.5)

## 2016-07-29 NOTE — Progress Notes (Signed)
[facility] Hematology/Oncology Consultation   Name: Alexandria Chen      MRN: [id number]    Location: Room/bed info not found  Date: 07/29/2016 Time:5:30 PM   REFERRING PHYSICIAN:  Evelina Dun, FNP (Primary Chen Provider)  REASON FOR CONSULT:  4.9 x 3.3 cm RUL pulmonary mass.    DIAGNOSIS:  4.9 x 3.3 cm RUL pulmonary mass with concerns for direct mediastinal invasion and right hilar, right paratracheal lymph node enlargement without distant site of metastatic disease.   HISTORY OF PRESENT ILLNESS:   Alexandria Chen is a 82 y.o. female with a medical history significant for history of alcoholism, anxiety/depression, essential hypertension, hyperlipidemia, and vitamin D deficiency, a-fib on Eliquis with plans to transition to warfarin on 08/16/2016 who is referred to the [facility] for right upper lung pulmonary mass measuring 4.9 cm in largest measured dimension.   Patient reports "feel pretty good, but not like it should."  She is unable to tell me exactly what that means.  Patient is accompanied by multiple family members and they all agree that their mother is in denial of recent findings on imaging.  Apparently the patient presented to her primary Chen provider with some altered mental status issues resulting in a workup including a chest x-ray.  Chest x-ray on 06/24/2016 demonstrated soft tissue mass in the right upper lobe and CT imaging was recommended.  This was followed by CT imaging on 06/25/2016 showing a large 4.9 x 3.3 cm mass in the medial aspect of the right upper lobe demonstrating evidence of potential direct mediastinal invasion pathologically enlarged right hilar and right paratracheal lymph nodes suggestive of nodal metastatic disease.  All findings are concerning for primary bronchogenic carcinoma.  PET imaging was subsequently recommended.  PET scan was performed on 07/07/2016 demonstrating a hypermetabolic 4.8 cm mass in the medial right upper lobe  abutting/invading the mediastinum suspicious for primary bronchogenic carcinoma with possible mild mediastinal/right hilar nodal metastases equivocal.  She was scheduled to see medical oncology immediately following PET scan but unfortunately the patient was in [place] and became dizzy resulting in an admission at [facility].  Therefore, her new patient appointment was deferred until discharge.  At [facility], she was found to be in atrial fibrillation.  She underwent cardioversion and anticoagulation therapy.  She is currently on Eliquis.  Plan is to transition her to Coumadin in April 2018.  Patient and family have been advised that she is to remain on anticoagulation.  Patient reports a 10 pound weight loss over the past 1 year.  She notes a fair appetite.  She has a chronic cough particularly at night.  She denies any hemoptysis.  She denies any chest pain or abdominal pain.   Review of Systems  Constitutional: Positive for weight loss. Negative for chills and fever.  HENT: Negative.   Eyes: Negative.  Negative for blurred vision and double vision.  Respiratory: Positive for cough (chronic). Negative for hemoptysis and sputum production.   Cardiovascular: Negative.  Negative for chest pain.  Gastrointestinal: Negative.  Negative for blood in stool, constipation, diarrhea, melena, nausea and vomiting.  Genitourinary: Negative.  Negative for hematuria.  Musculoskeletal: Negative.   Skin: Negative.   Neurological: Positive for dizziness. Negative for sensory change, speech change, focal weakness, seizures, loss of consciousness and weakness.  Endo/Heme/Allergies: Negative.  Does not bruise/bleed easily.  Psychiatric/Behavioral: Positive for substance abuse (tobacco abuse). The  patient is nervous/anxious.     PAST MEDICAL HISTORY:   Past Medical History:  Diagnosis Date  . Alcohol abuse, in remission   . Anxiety   . Depression   . HTN  (hypertension)   . Mass of right lung 07/13/2016  . Osteopenia     ALLERGIES: No Known Allergies    MEDICATIONS: I have reviewed the patient's current medications.    Current Outpatient Prescriptions on File Prior to Visit  Medication Sig Dispense Refill  . amiodarone (PACERONE) 200 MG tablet Take 2 tablets (400 mg) by mouth twice daily for 5 days then take 1 tablet (200 mg) by mouth daily    . apixaban (ELIQUIS) 5 MG TABS tablet Take 5 mg by mouth.    Marland Kitchen atorvastatin (LIPITOR) 40 MG tablet TAKE 1 TABLET (40 MG TOTAL) BY MOUTH DAILY. FOR HYPERLIPIDEMIA 90 tablet 1  . busPIRone (BUSPAR) 15 MG tablet Take 1 tablet (15 mg total) by mouth 2 (two) times daily. 180 tablet 2  . Calcium Carb-Cholecalciferol (CALCIUM 500+D3) 500-400 MG-UNIT TABS Take 1 tablet by mouth 2 (two) times daily.    . DULoxetine (CYMBALTA) 60 MG capsule TAKE 1 CAPSULE (60 MG TOTAL) BY MOUTH DAILY. 30 capsule 2  . fish oil-omega-3 fatty acids 1000 MG capsule Take 3 capsules (3 g total) by mouth daily. For hyperlipidemia. 30 capsule   . fluticasone (FLONASE) 50 MCG/ACT nasal spray Place 2 sprays into both nostrils 2 (two) times daily. Use as needed (Patient taking differently: Place 2 sprays into both nostrils 2 (two) times daily as needed for allergies. Use as needed) 16 g 6  . gabapentin (NEURONTIN) 100 MG capsule Take 1 capsule (100 mg total) by mouth at bedtime. 90 capsule 2  . lisinopril (PRINIVIL,ZESTRIL) 10 MG tablet Take 1 tablet (10 mg total) by mouth daily. 90 tablet 3  . meclizine (ANTIVERT) 25 MG tablet TAKE 0.5 TABLETS (12.5 MG TOTAL) BY MOUTH 2 (TWO) TIMES DAILY AS NEEDED. (Patient taking differently: TAKE 0.5 TABLETS (12.5 MG TOTAL) BY MOUTH 2 (TWO) TIMES DAILY AS NEEDED FOR DIZZYNESS) 20 tablet 2  . nitroGLYCERIN (NITROSTAT) 0.4 MG SL tablet Place 0.4 mg under the tongue.    . Vitamin D, Cholecalciferol, 400 UNITS CAPS Take 400 Units by mouth daily.    Marland Kitchen warfarin (COUMADIN) 2.5 MG tablet Take 1 tablet (2.5 mg  total) by mouth daily. (Patient not taking: Reported on 07/29/2016) 30 tablet 3   No current facility-administered medications on file prior to visit.      PAST SURGICAL HISTORY History reviewed. No pertinent surgical history.  FAMILY HISTORY: Family History  Problem Relation Age of Onset  . Anxiety disorder Mother   . Dementia Mother   . Hypertension Mother   . Alcohol abuse Father   . Arthritis Father   . Alcohol abuse Brother   . ADD / ADHD Neg Hx   . Drug abuse Neg Hx   . Bipolar disorder Neg Hx   . Depression Neg Hx   . OCD Neg Hx   . Paranoid behavior Neg Hx   . Schizophrenia Neg Hx   . Seizures Neg Hx   . Sexual abuse Neg Hx   . Physical abuse Neg Hx    Mother deceased at the age of 66 from complications of pneumonia. Father passed away in his late 23s secondary to myocardial infarction. She has 1 sister She has 5 children.  4 daughters and 1 son.  All daughters are present today during discussion.  She denies any family history of cancer.  SOCIAL HISTORY:  reports that she quit smoking about 2 weeks ago. Her smoking use included Cigarettes. She started smoking about 60 years ago. She has a 120.00 pack-year smoking history. She has never used smokeless tobacco. She reports that she does not drink alcohol or use drugs.  She quit drinking alcohol in 2014 with a past drinking history of 3 beers per day favoring [employer] and natural light.  She denies any illicit drug abuse.  She is retired and worked at a [employer] as a Training and development officer.  She admits that she is not religious.  She is a widower  5 years.  Her husband passed away secondary to complications of heart disease and diabetes.  Social History   Social History  . Marital status: Widowed    Spouse name: N/A  . Number of children: N/A  . Years of education: N/A   Social History Main Topics  . Smoking status: Former Smoker    Packs/day: 2.00    Years: 60.00    Types: Cigarettes    Start date: 05/03/1956    Quit  date: 07/15/2016  . Smokeless tobacco: Never Used  . Alcohol use No     Comment: Patient reports no alcohol use since 05/24/2016.  . Drug use: No  . Sexual activity: No   Other Topics Concern  . None   Social History Narrative  . None    PERFORMANCE STATUS: The patient's performance status is 2 - Symptomatic, <50% confined to bed  PHYSICAL EXAM: Most Recent Vital Signs: There were no vitals taken for this visit. There were no vitals taken for this visit.  General Appearance:    Alert, cooperative, no distress, appears stated age, accompanied by 4 daughters.  Head:    Normocephalic, without obvious abnormality, atraumatic  Eyes:    Conjunctiva/corneas clear, EOM's intact, both eyes  Ears:    Not examined  Nose:   Nares normal, septum midline, mucosa normal, no drainage    or sinus tenderness  Throat:   Lips, mucosa, and tongue normal.  Neck:   Supple, symmetrical, trachea midline, no adenopathy;    thyroid:  no enlargement/tenderness/nodules.  Back:     Symmetric, no curvature, ROM normal, no CVA tenderness  Lungs:     Clear to auscultation bilaterally, respirations unlabored, decreased breath sounds bilaterally.  Chest Wall:    No tenderness or deformity   Heart:    Regular rate and rhythm, S1 and S2 normal, no murmur, rub   or gallop  Breast Exam:    Not examined  Abdomen:     Soft, non-tender, bowel sounds active all four quadrants,    no masses, no organomegaly  Genitalia:    Not examined  Rectal:    Not examined  Extremities:   Extremities normal, atraumatic, no cyanosis or edema  Pulses:   Not examined  Skin:   Skin color, texture, turgor normal, no rashes or lesions  Lymph nodes:   Cervical, supraclavicular, and axillary nodes normal  Neurologic:   CNII-XII intact, normal strength, sensation and reflexes    throughout    LABORATORY DATA:  Results for orders placed or performed in visit on 07/29/16 (from the past 48 hour(s))  CBC with Differential     Status: None     Collection Time: 07/29/16  1:27 PM  Result Value Ref Range   WBC 6.3 4.0 - 10.5 K/uL   RBC 4.20 3.87 - 5.11 MIL/uL  Hemoglobin 12.7 12.0 - 15.0 g/dL   HCT 37.9 36.0 - 46.0 %   MCV 90.2 78.0 - 100.0 fL   MCH 30.2 26.0 - 34.0 pg   MCHC 33.5 30.0 - 36.0 g/dL   RDW 13.8 11.5 - 15.5 %   Platelets 313 150 - 400 K/uL   Neutrophils Relative % 52 %   Neutro Abs 3.3 1.7 - 7.7 K/uL   Lymphocytes Relative 39 %   Lymphs Abs 2.4 0.7 - 4.0 K/uL   Monocytes Relative 8 %   Monocytes Absolute 0.5 0.1 - 1.0 K/uL   Eosinophils Relative 1 %   Eosinophils Absolute 0.1 0.0 - 0.7 K/uL   Basophils Relative 0 %   Basophils Absolute 0.0 0.0 - 0.1 K/uL  Comprehensive metabolic panel     Status: Abnormal   Collection Time: 07/29/16  1:27 PM  Result Value Ref Range   Sodium 137 135 - 145 mmol/L   Potassium 3.9 3.5 - 5.1 mmol/L   Chloride 102 101 - 111 mmol/L   CO2 30 22 - 32 mmol/L   Glucose, Bld 94 65 - 99 mg/dL   BUN 12 6 - 20 mg/dL   Creatinine, Ser 0.85 0.44 - 1.00 mg/dL   Calcium 9.6 8.9 - 10.3 mg/dL   Total Protein 7.6 6.5 - 8.1 g/dL   Albumin 3.7 3.5 - 5.0 g/dL   AST 16 15 - 41 U/L   ALT 13 (L) 14 - 54 U/L   Alkaline Phosphatase 67 38 - 126 U/L   Total Bilirubin 0.7 0.3 - 1.2 mg/dL   GFR calc non Af Amer >60 >60 mL/min   GFR calc Af Amer >60 >60 mL/min    Comment: (NOTE) The eGFR has been calculated using the CKD EPI equation. This calculation has not been validated in all clinical situations. eGFR's persistently <60 mL/min signify possible Chronic Kidney Disease.    Anion gap 5 5 - 15      RADIOGRAPHY: CLINICAL DATA:  Initial treatment strategy for lung cancer.  EXAM: NUCLEAR MEDICINE PET SKULL BASE TO THIGH  TECHNIQUE: 6.3 mCi F-18 FDG was injected intravenously. Full-ring PET imaging was performed from the skull base to thigh after the radiotracer. CT data was obtained and used for attenuation correction and anatomic localization.  FASTING BLOOD GLUCOSE:  Value: 92  mg/dl  COMPARISON:  CT chest dated 06/25/2016  FINDINGS: NECK  No suspicious cervical lymphadenopathy.  CHEST  4.7 cm mass in the medial right upper lobe abutting/invading the mediastinum (series 4/image 50), max SUV 17.5, suspicious for primary bronchogenic neoplasm.  Associated 9 mm lower right paratracheal node (series 4/image 50) with mild hypermetabolism, max SUV 3.4, indeterminate.  Mild hypermetabolism in the right hilar region, max SUV 4.0, poorly evaluated on unenhanced CT.  Underlying mild paraseptal emphysematous changes, upper lobe predominant.  ABDOMEN/PELVIS  No abnormal hypermetabolic activity within the liver, pancreas, adrenal glands, or spleen.  3.6 x 2.3 cm subcapsular hypodense lesion along the lateral aspect of segment 5 (series 4/image 95), non FDG avid, likely reflecting a benign hemangioma when correlating with prior CT.  No hypermetabolic lymph nodes in the abdomen or pelvis.  SKELETON  No focal hypermetabolic activity to suggest skeletal metastasis.  IMPRESSION: 4.7 cm mass in the medial right upper lobe abutting/invading the mediastinum, suspicious for primary bronchogenic neoplasm.  Possible mild mediastinal/right hilar nodal metastases, equivocal.  No evidence of distant metastases.  3.6 x 2.3 cm subcapsular lesion in the inferior right liver is non FDG  avid, likely reflecting a benign hemangioma when correlating with prior CT.   Electronically Signed   By: Julian Hy M.D.   On: 07/07/2016 14:13   CLINICAL DATA:  Headache.  History of lung cancer  EXAM: CT HEAD WITHOUT CONTRAST  TECHNIQUE: Contiguous axial images were obtained from the base of the skull through the vertex without intravenous contrast.  COMPARISON:  05/02/2015 brain MRI  FINDINGS: Brain: No evidence of acute infarction, hemorrhage, hydrocephalus, extra-axial collection or mass lesion/mass effect.  Advanced chronic  microvascular disease with confluent white matter gliosis in the cerebral white matter and multiple remote lacunar infarcts in the deep gray nuclei and deep white matter tracts. Small remote left cerebellar infarct.  Vascular: Atherosclerosis.  No hyperdense vessel.  Skull: Normal. Negative for fracture or focal lesion.  Sinuses/Orbits: No acute finding.  IMPRESSION: 1. No acute finding. 2. Advanced chronic microvascular disease.   Electronically Signed   By: Monte Fantasia M.D.   On: 07/06/2016 07:22    CLINICAL DATA:  82 year old female with abnormal chest x-ray concerning for lung cancer. Followup study.  EXAM: CT CHEST WITH CONTRAST  TECHNIQUE: Multidetector CT imaging of the chest was performed during intravenous contrast administration.  CONTRAST:  53m ISOVUE-300 IOPAMIDOL (ISOVUE-300) INJECTION 61%  COMPARISON:  No prior chest CT.  Chest x-ray 06/24/2016.  FINDINGS: Cardiovascular: Heart size is mildly enlarged. There is no significant pericardial fluid, thickening or pericardial calcification. There is aortic atherosclerosis, as well as atherosclerosis of the great vessels of the mediastinum and the coronary arteries, including calcified atherosclerotic plaque in the left main, left anterior descending and left circumflex coronary arteries.  Mediastinum/Nodes: Multiple enlarged right hilar and mediastinal lymph nodes. The largest of these include a 1.3 cm right hilar lymph node (image 59 of series 2) and a 1.4 cm low right paratracheal lymph node (image 54 of series 2). No contralateral mediastinal or left hilar lymphadenopathy. Esophagus is unremarkable in appearance. No axillary lymphadenopathy.  Lungs/Pleura: Large mass in the medial aspect of the right upper lobe measuring 4.9 x 3.3 cm (axial image 53 of series 2). This makes a broad contact with the pleura adjacent to the mediastinum, obscuring the intervening fat plane and slightly  distorting the adjacent superior vena cava, which could indicate very early invasion (not yet definitive). In the surrounding lung parenchyma distal to the mass there are areas of septal thickening, which may reflect postobstructive changes or surrounding lymphangitic spread of disease. 4 mm pleural-based nodule associated with the minor fissure is nonspecific, but favored to represent a subpleural lymph node. No acute consolidative airspace disease. No pleural effusions. Patchy areas of peripheral predominant ground-glass attenuation are also noted throughout the lung bases bilaterally. Mild diffuse bronchial wall thickening with mild centrilobular and paraseptal emphysema.  Upper Abdomen: Incompletely visualized lesion in the right lobe of the liver which demonstrates some peripheral nodular enhancement, favored to represent a cavernous hemangioma (but not characterized) measuring at least 3.8 x 2.3 cm (image 146 of series 2). Incompletely visualized low-attenuation lesion measuring at least 2.1 x 3.3 cm in the anterior aspect of the interpolar region of the left kidney is not characterized, but likely a large cyst. Several other tiny subcentimeter low-attenuation lesions also noted in the kidneys bilaterally, also incompletely characterized due to their small size, but favored to represent tiny cysts. Multiple nodules associated with the adrenal glands bilaterally, incompletely characterized on today's contrast enhanced examination, largest of which measures up to 1.4 cm on the left. Aortic atherosclerosis.  Musculoskeletal: There are no aggressive appearing lytic or blastic lesions noted in the visualized portions of the skeleton.  IMPRESSION: 1. 4.9 x 3.3 cm mass in the medial aspect of the right upper lobe which demonstrates evidence of potential direct mediastinal invasion, and pathologically enlarged right hilar and right paratracheal lymph nodes which suggest nodal  metastatic disease. These findings are highly concerning for primary bronchogenic carcinoma and further evaluation with PET-CT is strongly recommended in the near future for further diagnostic and staging purposes. 2. There is also nodularity in the adrenal glands bilaterally, which could reflect additional sites of metastatic disease. Attention at time of forthcoming PET-CT is recommended. 3. Indeterminate lesion in the right lobe of the liver, favored to represent a cavernous hemangioma. Should this lesion prove to be hypermetabolic on PET imaging, further characterization with MRI of the abdomen with and without IV gadolinium could provide definitive characterization. 4. Aortic atherosclerosis, in addition to left main and 2 vessel coronary artery disease. Assessment for potential risk factor modification, dietary therapy or pharmacologic therapy may be warranted, if clinically indicated. 5. Mild diffuse bronchial wall thickening with mild centrilobular and paraseptal emphysema; imaging findings suggestive of underlying COPD. 6. Additional incidental findings, as above.   Electronically Signed   By: Vinnie Langton M.D.   On: 06/25/2016 15:43   PATHOLOGY:  N/A  ASSESSMENT/PLAN:   Mass of right lung 4.9 x 3.3 cm RUL pulmonary mass with concerns for direct mediastinal invasion and right hilar, right paratracheal lymph node enlargement without distant site of metastatic disease.  Clinically Stage IIIB (T4N2M0) with lower paratracheal lymph node being counted as a mediastinal lymph node.  Baseline lab work today: CBC diff, CMET.  To complete staging, she will need an MRI of brain w and wo contrast.  But given her hesitancy moving forward and possibility that she will not pursue treatment, will HOLD OFF on pursuing at this time.  If she decides to pursue treatment, then MRI brain will need to be done in order to rule out intracranial metastatic disease.  She needs diagnostic  biopsy.  I have messaged Dr. Roxan Hockey ([facility]) to see if pathologic mediastinal staging is necessary to confirm N2 disease and rule out N3 disease.  After discussion, invasive procedure to pathologically stage mediastinum would not change treatment moving forward and based upon PET imaging, Dr. Roxan Hockey agrees that the patient likely has N2 disease, as do I.    Order is placed for CT guided biopsy of lung mass.  She recently was diagnosed with new A-fib presenting at [facility].  She was cardioverted and she is now on Eliquis anticoagulation until transitioned to Coumadin in April 2018.  The patient has been advised that she MUST remain on anticoagulation.  I have asked patient/family to call [facility] cardiologist to see if they agree to holding Eliquis x 24 hours to undergo biopsy of lung mass.  They will call us if [facility] recommendation differs from ours.  I think, looking at the big picture, although there is a risk of holding anticoagulation x 24 hours, her biggest issue is her lung cancer at this time and therefore benefits outweigh risks.  Therefore, she has been advised to HOLD her Eliquis 24 hours prior to procedure and restart at the same dose the day after biopsy.  Clinically, based upon current radiology studies, I suspect she has Stage IIIB disease.  NCCN guidelines are reviewed and definitive concurrent chemoradiation is recommended followed by consolidative therapy (if primary treatment is tolerated) followed by  consolidative immunotherapy with Imfinzi.  HOWEVER, clinically, I do not think she is a candidate for concurrent treatment.  I recommend sequential treatment.    Patient is provided information about lung cancer.  She is informed about her clinical stage of disease based upon current information.  She is provided patient information from NCCN regarding lung cancer.  She is advised that biopsy is NECESSARY to confirm diagnosis to help guide treatment options moving forward as NSCLC and SCLC  are managed very differently with different prognosis.  Smoking cessation education is provided.  She will return ~ 1 week after biopsy to review pathology results and medical oncology recommendations.  After biopsy, will refer patient to XRT if she decides to pursue treatment and then we can discuss case with XRT for their recommendations regarding concurrent versus sequential treatment (and in what order).  She will need MRI brain w wo contrast to complete staging if she decides to pursue treatment.   ORDERS PLACED FOR THIS ENCOUNTER: Orders Placed This Encounter  Procedures  . CT Biopsy  . CBC with Differential  . Comprehensive metabolic panel    MEDICATIONS PRESCRIBED THIS ENCOUNTER: No orders of the defined types were placed in this encounter.   All questions were answered. The patient knows to call the clinic with any problems, questions or concerns. We can certainly see the patient much sooner if necessary.  Patient discussed with Dr. Talbert Cage and together we ascertained an up-to-date interval history, and examined the patient.  Dr. Talbert Cage developed the patient's assessment and plan.  This was a shared visit-consultation.  Her attestation will follow below.  This note is electronically signed by: Doy Mince 07/29/2016 5:30 PM

## 2016-07-29 NOTE — Assessment & Plan Note (Addendum)
4.9 x 3.3 cm RUL pulmonary mass with concerns for direct mediastinal invasion and right hilar, right paratracheal lymph node enlargement without distant site of metastatic disease.  Clinically Stage IIIB (T4N2M0) with lower paratracheal lymph node being counted as a mediastinal lymph node.  Baseline lab work today: CBC diff, CMET.  To complete staging, she will need an MRI of brain w and wo contrast.  But given her hesitancy moving forward and possibility that she will not pursue treatment, will HOLD OFF on pursuing at this time.  If she decides to pursue treatment, then MRI brain will need to be done in order to rule out intracranial metastatic disease.  She needs diagnostic biopsy.  I have messaged Dr. Roxan Hockey (CTS) to see if pathologic mediastinal staging is necessary to confirm N2 disease and rule out N3 disease.  After discussion, invasive procedure to pathologically stage mediastinum would not change treatment moving forward and based upon PET imaging, Dr. Roxan Hockey agrees that the patient likely has N2 disease, as do I.    Order is placed for CT guided biopsy of lung mass.  She recently was diagnosed with new A-fib presenting at [facility].  She was cardioverted and she is now on Eliquis anticoagulation until transitioned to Coumadin in April 2018.  The patient has been advised that she MUST remain on anticoagulation.  I have asked patient/family to call [facility] cardiologist to see if they agree to holding Eliquis x 24 hours to undergo biopsy of lung mass.  They will call us if [facility] recommendation differs from ours.  I think, looking at the big picture, although there is a risk of holding anticoagulation x 24 hours, her biggest issue is her lung cancer at this time and therefore benefits outweigh risks.  Therefore, she has been advised to HOLD her Eliquis 24 hours prior to procedure and restart at the same dose the day after biopsy.  Clinically, based upon current radiology studies, I suspect she  has Stage IIIB disease.  NCCN guidelines are reviewed and definitive concurrent chemoradiation is recommended followed by consolidative therapy (if primary treatment is tolerated) followed by consolidative immunotherapy with Imfinzi.  HOWEVER, clinically, I do not think she is a candidate for concurrent treatment.  I recommend sequential treatment.    Patient is provided information about lung cancer.  She is informed about her clinical stage of disease based upon current information.  She is provided patient information from NCCN regarding lung cancer.  She is advised that biopsy is NECESSARY to confirm diagnosis to help guide treatment options moving forward as NSCLC and SCLC are managed very differently with different prognosis.  Smoking cessation education is provided.  She will return ~ 1 week after biopsy to review pathology results and medical oncology recommendations.  After biopsy, will refer patient to XRT if she decides to pursue treatment and then we can discuss case with XRT for their recommendations regarding concurrent versus sequential treatment (and in what order).  She will need MRI brain w wo contrast to complete staging if she decides to pursue treatment.

## 2016-07-29 NOTE — Patient Instructions (Addendum)
[facility] at [facility] Discharge Instructions  RECOMMENDATIONS MADE BY THE CONSULTANT AND ANY TEST RESULTS WILL BE SENT TO YOUR REFERRING PHYSICIAN.  You were seen today by Kirby Crigler PA-C and Dr. Talbert Cage.  You are provided their business cards. We will get baseline blood work today. We will get you scheduled for a core biopsy of your lung mass with interventional radiology.  Interventional radiology is in [place] and they will contact you with infromation about your appointment with them and directions. You will need to HOLD your Eliquis 24 hours prior to your biopsy procedure, and then restart the day FOLLOWING the procedure (at the same dose). Please call you cardiologist at [facility] to confirm that this would be ok.  If not, please call [place] at [phone]. We will see you back about 1 week after your biopsy for follow-up, review of biopsy result, and further recommendations moving forward.   Thank you for choosing [place] at [facility] to provide your oncology and hematology care.  To afford each patient quality time with our provider, please arrive at least 15 minutes before your scheduled appointment time.    If you have a lab appointment with the [facility] please come in thru the  Main Entrance and check in at the main information desk  You need to re-schedule your appointment should you arrive 10 or more minutes late.  We strive to give you quality time with our providers, and arriving late affects you and other patients whose appointments are after yours.  Also, if you no show three or more times for appointments you may be dismissed from the clinic at the providers discretion.     Again, thank you for choosing [facility].  Our hope is that these requests will decrease the amount of time that you wait before being seen by our physicians.        _____________________________________________________________  Should you have questions after your visit to [facility], please contact our office at [phone] between the hours of 8:30 a.m. and 4:30 p.m.  Voicemails left after 4:30 p.m. will not be returned until the following business day.  For prescription refill requests, have your pharmacy contact our office.       Resources For Cancer Patients and their Caregivers ? American Cancer Society: Can assist with transportation, wigs, general needs, runs Look Good Feel Better.        4185350715 ? Cancer Care: Provides financial assistance, online support groups, medication/co-pay assistance.  1-800-813-HOPE 301 330 8452) ? [place] Assists [place] cancer patients and their families through emotional , educational and financial support.  6318488298 ? [place] DSS Where to apply for food stamps, Medicaid and utility assistance. (904)742-8898 ? [facility]: Transportation to medical appointments. 253-473-4902 ? Social Security Administration: May apply for disability if have a Stage IV cancer. (850)050-5545 978 630 3065 ? [employer], Disability and Transit Services: Assists with nutrition, care and transit needs. [place] Support Programs: '@10RELATIVEDAYS'$ @ > Cancer Support Group  2nd Tuesday of the month 1pm-2pm, Journey Room  > Creative Journey  3rd Tuesday of the month 1130am-1pm, Journey Room  > Look Good Feel Better  1st Wednesday of the month 10am-12 noon, Journey Room (Call [place] to register [phone])

## 2016-08-02 ENCOUNTER — Ambulatory Visit (INDEPENDENT_AMBULATORY_CARE_PROVIDER_SITE_OTHER): Payer: Medicare Other | Admitting: Psychiatry

## 2016-08-02 ENCOUNTER — Encounter (HOSPITAL_COMMUNITY): Payer: Self-pay | Admitting: Psychiatry

## 2016-08-02 VITALS — BP 150/81 | HR 58 | Resp 18 | Wt 128.8 lb

## 2016-08-02 DIAGNOSIS — Z810 Family history of intellectual disabilities: Secondary | ICD-10-CM | POA: Diagnosis not present

## 2016-08-02 DIAGNOSIS — Z818 Family history of other mental and behavioral disorders: Secondary | ICD-10-CM

## 2016-08-02 DIAGNOSIS — Z811 Family history of alcohol abuse and dependence: Secondary | ICD-10-CM

## 2016-08-02 DIAGNOSIS — Z0289 Encounter for other administrative examinations: Secondary | ICD-10-CM

## 2016-08-02 DIAGNOSIS — F1010 Alcohol abuse, uncomplicated: Secondary | ICD-10-CM | POA: Diagnosis not present

## 2016-08-02 DIAGNOSIS — F419 Anxiety disorder, unspecified: Secondary | ICD-10-CM

## 2016-08-02 DIAGNOSIS — Z813 Family history of other psychoactive substance abuse and dependence: Secondary | ICD-10-CM

## 2016-08-02 DIAGNOSIS — F331 Major depressive disorder, recurrent, moderate: Secondary | ICD-10-CM

## 2016-08-02 MED ORDER — GABAPENTIN 100 MG PO CAPS: 100.0000 mg | ORAL_CAPSULE | Freq: Every day | ORAL | 2 refills | Status: AC

## 2016-08-02 MED ORDER — DULOXETINE HCL 60 MG PO CPEP: 60.0000 mg | ORAL_CAPSULE | Freq: Every day | ORAL | 2 refills | Status: AC

## 2016-08-02 MED ORDER — LORAZEPAM 0.5 MG PO TABS: ORAL_TABLET | ORAL | 2 refills | Status: DC

## 2016-08-02 MED ORDER — BUSPIRONE HCL 15 MG PO TABS: 15.0000 mg | ORAL_TABLET | Freq: Two times a day (BID) | ORAL | 2 refills | Status: AC

## 2016-08-02 NOTE — Progress Notes (Signed)
Patient ID: Alexandria Chen, female   DOB: 1934/05/27, 82 y.o.   MRN: [id number] Patient ID: Alexandria Chen, female   DOB: 1934/10/11, 82 y.o.   MRN: [id number] Patient ID: Alexandria Chen, female   DOB: 01/24/1935, 82 y.o.   MRN: [id number] Patient ID: Alexandria Chen, female   DOB: 01/12/1935, 82 y.o.   MRN: [id number] Patient ID: Alexandria Chen, female   DOB: 02/27/1935, 82 y.o.   MRN: [id number] Patient ID: Alexandria Chen, female   DOB: [birth year], 82 y.o.   MRN: [id number] Patient ID: Alexandria Chen, female   DOB: 13-Apr-1935, 82 y.o.   MRN: [id number] Patient ID: Alexandria Chen, female   DOB: 11/19/1934, 82 y.o.   MRN: [id number] Patient ID: Alexandria Chen, female   DOB: May 21, 1934, 82 y.o.   MRN: [id number] Patient ID: Alexandria Chen, female   DOB: 12-21-1934, 82 y.o.   MRN: [id number] Patient ID: Alexandria Chen, female   DOB: 04-29-1935, 82 y.o.   MRN: [id number] Patient ID: Alexandria Chen, female   DOB: [birth year], 82 y.o.   MRN: [id number] [place] [address] Progress Note Alexandria Chen MRN: [id number] DOB: 1934/12/27 Age: 82 y.o. y.o.  Date: 08/02/2016 Start Time: 10:40 AM End Time: 10:59 AM  Chief Complaint: Chief Complaint  Patient presents with  . Depression  . Anxiety  . Follow-up   Subjective:  "I've been nervous  This patient is a 82 year old widowed black female who lives with her 48 year old grandson in [place]. She is a retired Engineer, materials.  The patient has had a long history of depression anxiety. She's always been an anxious person and thinks she inherited this trait from her mother. She used to abuse alcohol but had stopped for several years until a relapse last year. She was hospitalized in 2013 for alcohol relapse and has not used since. Her 32 year old mother lives across the street and calls her constantly and this makes her nervous. She does state that the addition of Neurontin and BuSpar helped considerably with her anxiety. In general her mood is good, her energy is good and she is sleeping well.  She denies any suicidal ideation  Patient returns after 3 months and she is here with her daughter [name]. Unfortunately, the patient has been diagnosed with lung cancer. She has to make a decision about whether to move forward with chemotherapy/radiation. Her next step is to have a biopsy. She relies a lot on her kids to make decisions for her. She states that she sleeping well but is very anxious and worried through the day. She is seeing Maurice Small here and that has been helpful. I suggested she could have a very small dose of Ativan that her daughter could control and only give as needed if she gets very anxious. She hasn't been real steady on her feet and has fallen a couple of times and I don't want to make her more unsteady with high-dose benzodiazepines  Current psychiatric medication BuSpar 15 mg 3 times a day Neurontin 100 mg at bedtime Prozac 40 mg daily.   BP (!) 150/81   Pulse (!) 58   Resp 18   Wt 128 lb 12.8 oz (58.4 kg)   BMI 23.56 kg/m   Past psychiatric history Patient was  admitted to behavioral [facility] in June 2013 due to relapse into her alcohol.  She has significant history of depression and alcohol abuse.  She has at least 2 other psychiatric admission at behavioral  [facility] which are exacerbated by alcohol intake.   Patient had a good response with Prozac and BuSpar.  She was taking benzodiazepine in the past however due to history of alcohol it was discontinued.  Patient denies any history of paranoia or psychosis.  Allergies: No Known Allergies Medical History: Past Medical History:  Diagnosis Date  . Alcohol abuse, in remission   . Anxiety   . Depression   . HTN (hypertension)   . Mass of right lung 07/13/2016  . Osteopenia   Hypertension.  Patient sees Dr. Ronnald Collum in [place].    Surgical History: No past surgical history on file. Family History: family history includes Alcohol abuse in her brother and father; Anxiety disorder  in her mother; Arthritis in her father; Dementia in her mother; Hypertension in her mother. Reviewed and no changes noted today.  Psychosocial history Patient lives by herself however his grandson stays sometime with her.  Patient has 4 daughter and one son.  2 of her daughter lives close by.      Alcohol and Substance use history The patient has significant history of drinking alcohol for more than 30 years.  She  has been admitted few times due to relapse into drinking and worsening of depression .  Patient claims to be sober since she released from the hospital.    Mental status examination.  Patient is casually dressed.  She is pleasant cooperative and maintained good eye contact.  Her attention and concentration is  good.  Her speech is clear and coherent.  Her thought process is also logical and goal-directed.  She described her mood as anxious and worried and her affect is congruent  She denies any active or passive suicidal thoughts or homicidal thoughts.  There were no paranoia or delusion present at this time.  There were no tremors although her hands are shaky.  Her attention concentration is good.  She's alert oriented x3.  There were no flight of idea or loose association.  Her insight judgment and pulse control isgood. Fund of knowledge is fair speech is clear and coherent and memory is reported as declining. Her appetite has also been low at home  Lab Results:  Results for orders placed or performed in visit on 07/29/16 (from the past 8736 hour(s))  CBC with Differential   Collection Time: 07/29/16  1:27 PM  Result Value Ref Range   WBC 6.3 4.0 - 10.5 K/uL   RBC 4.20 3.87 - 5.11 MIL/uL   Hemoglobin 12.7 12.0 - 15.0 g/dL   HCT 37.9 36.0 - 46.0 %   MCV 90.2 78.0 - 100.0 fL   MCH 30.2 26.0 - 34.0 pg   MCHC 33.5 30.0 - 36.0 g/dL   RDW 13.8 11.5 - 15.5 %   Platelets 313 150 - 400 K/uL   Neutrophils Relative % 52 %   Neutro Abs 3.3 1.7 - 7.7 K/uL   Lymphocytes Relative 39 %    Lymphs Abs 2.4 0.7 - 4.0 K/uL   Monocytes Relative 8 %   Monocytes Absolute 0.5 0.1 - 1.0 K/uL   Eosinophils Relative 1 %   Eosinophils Absolute 0.1 0.0 - 0.7 K/uL   Basophils Relative 0 %   Basophils Absolute 0.0 0.0 - 0.1 K/uL  Comprehensive metabolic panel   Collection Time: 07/29/16  1:27 PM  Result Value Ref Range   Sodium 137 135 - 145 mmol/L   Potassium 3.9 3.5 - 5.1 mmol/L   Chloride 102 101 -  111 mmol/L   CO2 30 22 - 32 mmol/L   Glucose, Bld 94 65 - 99 mg/dL   BUN 12 6 - 20 mg/dL   Creatinine, Ser 0.85 0.44 - 1.00 mg/dL   Calcium 9.6 8.9 - 10.3 mg/dL   Total Protein 7.6 6.5 - 8.1 g/dL   Albumin 3.7 3.5 - 5.0 g/dL   AST 16 15 - 41 U/L   ALT 13 (L) 14 - 54 U/L   Alkaline Phosphatase 67 38 - 126 U/L   Total Bilirubin 0.7 0.3 - 1.2 mg/dL   GFR calc non Af Amer >60 >60 mL/min   GFR calc Af Amer >60 >60 mL/min   Anion gap 5 5 - 15  Results for orders placed or performed in visit on 07/19/16 (from the past 8736 hour(s))  CMP14+EGFR   Collection Time: 07/19/16  3:19 PM  Result Value Ref Range   Glucose 78 65 - 99 mg/dL   BUN 9 8 - 27 mg/dL   Creatinine, Ser 0.82 0.57 - 1.00 mg/dL   GFR calc non Af Amer 68 >59 mL/min/1.73   GFR calc Af Amer 79 >59 mL/min/1.73   BUN/Creatinine Ratio 11 (L) 12 - 28   Sodium 138 134 - 144 mmol/L   Potassium 3.9 3.5 - 5.2 mmol/L   Chloride 98 96 - 106 mmol/L   CO2 25 18 - 29 mmol/L   Calcium 9.6 8.7 - 10.3 mg/dL   Total Protein 6.8 6.0 - 8.5 g/dL   Albumin 3.7 3.5 - 4.8 g/dL   Globulin, Total 3.1 1.5 - 4.5 g/dL   Albumin/Globulin Ratio 1.2 1.2 - 2.2   Bilirubin Total 0.3 0.0 - 1.2 mg/dL   Alkaline Phosphatase 74 39 - 117 IU/L   AST 13 0 - 40 IU/L   ALT 9 0 - 32 IU/L  CBC with Differential/Platelet   Collection Time: 07/19/16  3:19 PM  Result Value Ref Range   WBC 5.9 3.4 - 10.8 x10E3/uL   RBC 4.00 3.77 - 5.28 x10E6/uL   Hemoglobin 12.0 11.1 - 15.9 g/dL   Hematocrit 35.6 34.0 - 46.6 %   MCV 89 79 - 97 fL   MCH 30.0 26.6 - 33.0 pg    MCHC 33.7 31.5 - 35.7 g/dL   RDW 14.1 12.3 - 15.4 %   Platelets 358 150 - 379 x10E3/uL   Neutrophils 49 Not Estab. %   Lymphs 38 Not Estab. %   Monocytes 12 Not Estab. %   Eos 1 Not Estab. %   Basos 0 Not Estab. %   Neutrophils Absolute 2.9 1.4 - 7.0 x10E3/uL   Lymphocytes Absolute 2.3 0.7 - 3.1 x10E3/uL   Monocytes Absolute 0.7 0.1 - 0.9 x10E3/uL   EOS (ABSOLUTE) 0.1 0.0 - 0.4 x10E3/uL   Basophils Absolute 0.0 0.0 - 0.2 x10E3/uL   Immature Granulocytes 0 Not Estab. %   Immature Grans (Abs) 0.0 0.0 - 0.1 x10E3/uL  Results for orders placed or performed during the hospital encounter of 07/07/16 (from the past 8736 hour(s))  Glucose, capillary   Collection Time: 07/07/16 10:30 AM  Result Value Ref Range   Glucose-Capillary 92 65 - 99 mg/dL  Results for orders placed or performed in visit on 06/24/16 (from the past 8736 hour(s))  Microscopic Examination   Collection Time: 06/24/16 12:54 PM  Result Value Ref Range   WBC, UA 0-5 0 - 5 /hpf   RBC, UA None seen 0 - 2 /hpf   Epithelial Cells (non renal)  0-10 0 - 10 /hpf   Renal Epithel, UA None seen None seen /hpf   Bacteria, UA None seen None seen/Few  Urinalysis, Complete   Collection Time: 06/24/16 12:54 PM  Result Value Ref Range   Specific Gravity, UA 1.015 1.005 - 1.030   pH, UA 5.0 5.0 - 7.5   Color, UA Yellow Yellow   Appearance Ur Clear Clear   Leukocytes, UA Negative Negative   Protein, UA Negative Negative/Trace   Glucose, UA Negative Negative   Ketones, UA Negative Negative   RBC, UA Negative Negative   Bilirubin, UA Negative Negative   Urobilinogen, Ur 0.2 0.2 - 1.0 mg/dL   Nitrite, UA Negative Negative   Microscopic Examination See below:   CMP14+EGFR   Collection Time: 06/24/16  1:17 PM  Result Value Ref Range   Glucose 76 65 - 99 mg/dL   BUN 18 8 - 27 mg/dL   Creatinine, Ser 0.77 0.57 - 1.00 mg/dL   GFR calc non Af Amer 74 >59   GFR calc Af Amer 85 >59   BUN/Creatinine Ratio 23 12 - 28   Sodium 141 134  - 144 mmol/L   Potassium 4.2 3.5 - 5.2 mmol/L   Chloride 99 96 - 106 mmol/L   CO2 29 18 - 29 mmol/L   Calcium 10.3 8.7 - 10.3 mg/dL   Total Protein 7.0 6.0 - 8.5 g/dL   Albumin 4.0 3.5 - 4.8 g/dL   Globulin, Total 3.0 1.5 - 4.5   Albumin/Globulin Ratio 1.3 1.2 - 2.2   Bilirubin Total 0.3 0.0 - 1.2 mg/dL   Alkaline Phosphatase 73 39 - 117 IU/L   AST 14 0 - 40 IU/L   ALT 8 0 - 32 IU/L  CBC with Differential/Platelet   Collection Time: 06/24/16  1:17 PM  Result Value Ref Range   WBC 6.0 3.4 - 10.8 x10E3/uL   RBC 4.34 3.77 - 5.28 x10E6/uL   Hemoglobin 12.7 11.1 - 15.9 g/dL   Hematocrit 39.0 34.0 - 46.6 %   MCV 90 79 - 97 fL   MCH 29.3 26.6 - 33.0 pg   MCHC 32.6 31.5 - 35.7 g/dL   RDW 14.2 12.3 - 15.4 %   Platelets 299 150 - 379 x10E3/uL   Neutrophils 43 Not Estab. %   Lymphs 42 Not Estab. %   Monocytes 12 Not Estab. %   Eos 3 Not Estab. %   Basos 0 Not Estab. %   Neutrophils Absolute 2.6 1.4 - 7.0 x10E3/uL   Lymphocytes Absolute 2.5 0.7 - 3.1 x10E3/uL   Monocytes Absolute 0.7 0.1 - 0.9 x10E3/uL   EOS (ABSOLUTE) 0.2 0.0 - 0.4 x10E3/uL   Basophils Absolute 0.0 0.0 - 0.2 x10E3/uL   Immature Granulocytes 0 Not Estab. %   Immature Grans (Abs) 0.0 0.0 - 0.1 x10E3/uL  Results for orders placed or performed in visit on 05/21/16 (from the past 8736 hour(s))  CMP14+EGFR   Collection Time: 05/21/16  2:49 PM  Result Value Ref Range   Glucose 86 65 - 99 mg/dL   BUN 15 8 - 27 mg/dL   Creatinine, Ser 0.75 0.57 - 1.00 mg/dL   GFR calc non Af Amer 76 >59 mL/min/1.73   GFR calc Af Amer 88 >59 mL/min/1.73   BUN/Creatinine Ratio 20 12 - 28   Sodium 140 134 - 144 mmol/L   Potassium 4.0 3.5 - 5.2 mmol/L   Chloride 97 96 - 106 mmol/L   CO2 28 18 - 29 mmol/L  Calcium 10.0 8.7 - 10.3 mg/dL   Total Protein 6.6 6.0 - 8.5 g/dL   Albumin 4.0 3.5 - 4.8 g/dL   Globulin, Total 2.6 1.5 - 4.5 g/dL   Albumin/Globulin Ratio 1.5 1.2 - 2.2   Bilirubin Total 0.3 0.0 - 1.2 mg/dL   Alkaline Phosphatase  70 39 - 117 IU/L   AST 13 0 - 40 IU/L   ALT 8 0 - 32 IU/L  Lipid panel   Collection Time: 05/21/16  2:49 PM  Result Value Ref Range   Cholesterol, Total 161 100 - 199 mg/dL   Triglycerides 63 0 - 149 mg/dL   HDL 48 >39 mg/dL   VLDL Cholesterol Cal 13 5 - 40 mg/dL   LDL Calculated 100 (H) 0 - 99 mg/dL   Chol/HDL Ratio 3.4 0.0 - 4.4 ratio units  VITAMIN D 25 Hydroxy (Vit-D Deficiency, Fractures)   Collection Time: 05/21/16  2:49 PM  Result Value Ref Range   Vit D, 25-Hydroxy 35.1 30.0 - 100.0 ng/mL  Specimen Status   Collection Time: 05/21/16  2:49 PM  Result Value Ref Range   WBC WILL FOLLOW    RBC WILL FOLLOW    Hemoglobin WILL FOLLOW    Hematocrit WILL FOLLOW    MCV WILL FOLLOW    MCH WILL FOLLOW    MCHC WILL FOLLOW    RDW WILL FOLLOW    Platelets WILL FOLLOW    Neutrophils WILL FOLLOW    Lymphs WILL FOLLOW    Monocytes WILL FOLLOW    Eos WILL FOLLOW    Basos WILL FOLLOW    Neutrophils Absolute WILL FOLLOW    Lymphocytes Absolute WILL FOLLOW    Monocytes Absolute WILL FOLLOW    EOS (ABSOLUTE) WILL FOLLOW    Basophils Absolute WILL FOLLOW    Immature Granulocytes WILL FOLLOW    Immature Grans (Abs) WILL FOLLOW   Results for orders placed or performed in visit on 02/05/16 (from the past 8736 hour(s))  CMP14+EGFR   Collection Time: 02/05/16  2:41 PM  Result Value Ref Range   Glucose 77 65 - 99 mg/dL   BUN 10 8 - 27 mg/dL   Creatinine, Ser 0.69 0.57 - 1.00 mg/dL   GFR calc non Af Amer 83 >59 mL/min/1.73   GFR calc Af Amer 96 >59 mL/min/1.73   BUN/Creatinine Ratio 14 12 - 28   Sodium 141 134 - 144 mmol/L   Potassium 4.0 3.5 - 5.2 mmol/L   Chloride 101 96 - 106 mmol/L   CO2 24 18 - 29 mmol/L   Calcium 10.4 (H) 8.7 - 10.3 mg/dL   Total Protein 7.5 6.0 - 8.5 g/dL   Albumin 4.4 3.5 - 4.8 g/dL   Globulin, Total 3.1 1.5 - 4.5 g/dL   Albumin/Globulin Ratio 1.4 1.2 - 2.2   Bilirubin Total 0.5 0.0 - 1.2 mg/dL   Alkaline Phosphatase 84 39 - 117 IU/L   AST 18 0 - 40 IU/L    ALT 9 0 - 32 IU/L  Lipid panel   Collection Time: 02/05/16  2:41 PM  Result Value Ref Range   Cholesterol, Total 181 100 - 199 mg/dL   Triglycerides 100 0 - 149 mg/dL   HDL 57 >39 mg/dL   VLDL Cholesterol Cal 20 5 - 40 mg/dL   LDL Calculated 104 (H) 0 - 99 mg/dL   Chol/HDL Ratio 3.2 0.0 - 4.4 ratio units  VITAMIN D 25 Hydroxy (Vit-D Deficiency, Fractures)   Collection Time: 02/05/16  2:41 PM  Result Value Ref Range   Vit D, 25-Hydroxy 33.2 30.0 - 100.0 ng/mL  CBC with Differential/Platelet   Collection Time: 02/05/16  2:41 PM  Result Value Ref Range   WBC 5.7 3.4 - 10.8 x10E3/uL   RBC 4.65 3.77 - 5.28 x10E6/uL   Hemoglobin 14.1 11.1 - 15.9 g/dL   Hematocrit 41.5 34.0 - 46.6 %   MCV 89 79 - 97 fL   MCH 30.3 26.6 - 33.0 pg   MCHC 34.0 31.5 - 35.7 g/dL   RDW 14.8 12.3 - 15.4 %   Platelets 327 150 - 379 x10E3/uL   Neutrophils 52 Not Estab. %   Lymphs 37 Not Estab. %   Monocytes 10 Not Estab. %   Eos 1 Not Estab. %   Basos 0 Not Estab. %   Neutrophils Absolute 2.9 1.4 - 7.0 x10E3/uL   Lymphocytes Absolute 2.1 0.7 - 3.1 x10E3/uL   Monocytes Absolute 0.6 0.1 - 0.9 x10E3/uL   EOS (ABSOLUTE) 0.1 0.0 - 0.4 x10E3/uL   Basophils Absolute 0.0 0.0 - 0.2 x10E3/uL   Immature Granulocytes 0 Not Estab. %   Immature Grans (Abs) 0.0 0.0 - 0.1 x10E3/uL  Thyroid Panel With TSH   Collection Time: 02/05/16  2:41 PM  Result Value Ref Range   TSH 1.010 0.450 - 4.500 uIU/mL   T4, Total 6.9 4.5 - 12.0 ug/dL   T3 Uptake Ratio 25 24 - 39 %   Free Thyroxine Index 1.7 1.2 - 4.9  Results for orders placed or performed during the hospital encounter of 10/19/15 (from the past 8736 hour(s))  CBC with Differential   Collection Time: 10/19/15  2:10 PM  Result Value Ref Range   WBC 6.0 4.0 - 10.5 K/uL   RBC 4.02 3.87 - 5.11 MIL/uL   Hemoglobin 12.1 12.0 - 15.0 g/dL   HCT 35.6 (L) 36.0 - 46.0 %   MCV 88.6 78.0 - 100.0 fL   MCH 30.1 26.0 - 34.0 pg   MCHC 34.0 30.0 - 36.0 g/dL   RDW 14.4 11.5 - 15.5  %   Platelets 281 150 - 400 K/uL   Neutrophils Relative % 62 %   Neutro Abs 3.7 1.7 - 7.7 K/uL   Lymphocytes Relative 29 %   Lymphs Abs 1.8 0.7 - 4.0 K/uL   Monocytes Relative 8 %   Monocytes Absolute 0.5 0.1 - 1.0 K/uL   Eosinophils Relative 1 %   Eosinophils Absolute 0.1 0.0 - 0.7 K/uL   Basophils Relative 0 %   Basophils Absolute 0.0 0.0 - 0.1 K/uL  Basic metabolic panel   Collection Time: 10/19/15  2:10 PM  Result Value Ref Range   Sodium 135 135 - 145 mmol/L   Potassium 3.9 3.5 - 5.1 mmol/L   Chloride 106 101 - 111 mmol/L   CO2 24 22 - 32 mmol/L   Glucose, Bld 88 65 - 99 mg/dL   BUN 15 6 - 20 mg/dL   Creatinine, Ser 0.74 0.44 - 1.00 mg/dL   Calcium 8.5 (L) 8.9 - 10.3 mg/dL   GFR calc non Af Amer >60 >60 mL/min   GFR calc Af Amer >60 >60 mL/min   Anion gap 5 5 - 15  Troponin I   Collection Time: 10/19/15  2:10 PM  Result Value Ref Range   Troponin I <0.03 <0.031 ng/mL  Results for orders placed or performed during the hospital encounter of 09/04/15 (from the past 8736 hour(s))  Troponin I  Collection Time: 09/04/15  5:00 PM  Result Value Ref Range   Troponin I <0.03 <0.031 ng/mL  Basic metabolic panel   Collection Time: 09/04/15  5:00 PM  Result Value Ref Range   Sodium 137 135 - 145 mmol/L   Potassium 3.8 3.5 - 5.1 mmol/L   Chloride 103 101 - 111 mmol/L   CO2 27 22 - 32 mmol/L   Glucose, Bld 100 (H) 65 - 99 mg/dL   BUN 17 6 - 20 mg/dL   Creatinine, Ser 0.89 0.44 - 1.00 mg/dL   Calcium 9.5 8.9 - 10.3 mg/dL   GFR calc non Af Amer >60 >60 mL/min   GFR calc Af Amer >60 >60 mL/min   Anion gap 7 5 - 15  CBC with Differential   Collection Time: 09/04/15  5:00 PM  Result Value Ref Range   WBC 8.5 4.0 - 10.5 K/uL   RBC 4.55 3.87 - 5.11 MIL/uL   Hemoglobin 13.5 12.0 - 15.0 g/dL   HCT 40.7 36.0 - 46.0 %   MCV 89.5 78.0 - 100.0 fL   MCH 29.7 26.0 - 34.0 pg   MCHC 33.2 30.0 - 36.0 g/dL   RDW 14.3 11.5 - 15.5 %   Platelets 283 150 - 400 K/uL   Neutrophils Relative  % 73 %   Neutro Abs 6.3 1.7 - 7.7 K/uL   Lymphocytes Relative 19 %   Lymphs Abs 1.6 0.7 - 4.0 K/uL   Monocytes Relative 7 %   Monocytes Absolute 0.6 0.1 - 1.0 K/uL   Eosinophils Relative 1 %   Eosinophils Absolute 0.1 0.0 - 0.7 K/uL   Basophils Relative 0 %   Basophils Absolute 0.0 0.0 - 0.1 K/uL   Assessment.  Axis I Maj.  Depressive disorder , alcohol abuse.  anxiety disorder NOS  Axis II deferred Axis III hypertension  Axis IV moderate Axis V 60-65  Plan/Discussion: I took her vitals.  I reviewed CC, tobacco/med/surg Hx, meds effects/ side effects, problem list, therapies and responses as well as current situation/symptoms discussed options. Patient will continue Neurontin  100 mg daily. She will continue BuSpar 15 mg to 3 times a day for anxiety.will Continue  Cymbalta 60 mg daily she was given a prescription for lorazepam 0.5 mg and I instructed her daughter to cut it in half and use it daily only if needed for anxiety. She'll return in 2 months See orders and pt instructions for more details.  MEDICATIONS this encounter: Meds ordered this encounter  Medications  . DULoxetine (CYMBALTA) 60 MG capsule    Sig: Take 1 capsule (60 mg total) by mouth daily.    Dispense:  30 capsule    Refill:  2  . busPIRone (BUSPAR) 15 MG tablet    Sig: Take 1 tablet (15 mg total) by mouth 2 (two) times daily.    Dispense:  180 tablet    Refill:  2  . LORazepam (ATIVAN) 0.5 MG tablet    Sig: Take one daily as needed for anxiety    Dispense:  30 tablet    Refill:  2   Medical Decision Making Problem Points:  Established problem, stable/improving (1), Review of last therapy session (1) and Review of psycho-social stressors (1) Data Points:  Review or order clinical lab tests (1) Review of medication regiment & side effects (2)  I certify that outpatient services furnished can reasonably be expected to improve the patient's condition.   Levonne Spiller, MD

## 2016-08-04 ENCOUNTER — Telehealth (HOSPITAL_COMMUNITY): Payer: Self-pay | Admitting: Emergency Medicine

## 2016-08-04 NOTE — Telephone Encounter (Signed)
pts daughter called and stated that the biopsy was scheduled for 08/11/2016.  She finally got in touch with cardiology and since [place] had a cardioversion on 07/15/2016 that she would need to wait at least until 08/26/2016 to have the biopsy completed.  I provided [facility] number for her to call and re-schedule the biopsy.  [phone].  I told her I would let Tom know.

## 2016-08-05 ENCOUNTER — Other Ambulatory Visit: Payer: Self-pay | Admitting: Family

## 2016-08-05 ENCOUNTER — Ambulatory Visit (INDEPENDENT_AMBULATORY_CARE_PROVIDER_SITE_OTHER): Payer: Medicare Other | Admitting: Psychiatry

## 2016-08-05 DIAGNOSIS — F331 Major depressive disorder, recurrent, moderate: Secondary | ICD-10-CM

## 2016-08-05 NOTE — Progress Notes (Signed)
Alexandria Chen           DOB:                               28-Apr-1935  MR Number:                  [id number]  Location:                        [facility]:  [address] [place],  [place], [address]  Start:                              Thursday 08/05/2016 1:14 PM End:                               Thursday 08/05/2016  2:04 PM           Provider/Observer:                           Maurice Small, MSW, LCSW   Chief Complaint:                                   Chief Complaint  Patient presents with  . Anxiety    Reason For Service:                                     The patient is a 82 year old African American female who has a long-standing history of recurrent periods of symptoms of depression and anxiety. Patient also has a history of alcohol abuse/dependence and was hospitalized in July 2013 due to relapse and drinking approximately a 6 pack of beer daily along with experiencing increased symptoms of depression and anxiety. This was patient's third psychiatric hospitalization. Patient last was seen in February 2015. She is resuming services as she reports she resumed alcohol use in November 2017 and has been drinking one can of beer nearly every day since that time to try to calm her nerves. She wants to improve coping skills and avoid going to detox. She reports increased anxiety, excessive worry, memory difficulty,poor motivation, and fatigue.   Interventions Strategy:                    Supportive therapy, cognitive behavioral therapy  Participation Level:                           Active  Participation Quality:                       Appropriate                            Behavioral Observation:                   less fidgety, talkative, cooperative, anxious,   Current Psychosocial Factors:        Mass on lung was recently discovered, patient's 21 year-old grandson and his 84 year old daughter are residing with patient (stressful as they  can both be  argumentative and don't clean up after themselves per patient's report), concerns about her own health as she has lost her hearing aid, needs glasses, and has some difficulty walking but is going to physical therapy, concerns about youngest daughter who has health issues,    Content of Session:                          Reviewed symptoms, praised and reinforced patient's use of mindful breathing and continued involvement in activity scheduling,facilitated expression of thoughts and feelings regarding her health, assisted patient identify and verbalize her fears about her health, discussed patient's thoughts about death and spirituality, praised and reinforced patient's involvement in activity, assigned patient to practice mindful breathing technique daily  Current Status:                                   depressed mood, anxiety, excessive worry, fatigue    Patient Progress:                              Fair. The patient reports continued anxiety and worry about her health. She reports sleeping well at night but worrying during the day.  She reports having more medical tests and is scheduled to have a biopsy in the near future. She still says she does not know what is going on with her health but says her children know. She states sometimes still feeling numb. She reports fearing her health condition is serious and that she may die. She expresses fear of death as she states she isn't where she thinks she should be with God. She knows a Theme park manager but states she is not ready to talk with him yet about this. She reports continuing to go places with her children. She also reports enjoying celebrating her 82th birthday with a cookout with her family.   Target Goals:                        1. Learn and implement behavioral strategies to overcome depression.      2. Learn and implement calming skills.        3. Identify, challenge, and replace negative self talk that evokes anxiety with healthy  alternatives.   Last Reviewed:   06/07/2016                                                  Goals Addressed Today:                 1,2,  Impression/Diagnosis:                     The patient has a long-standing history of recurrent periods of depression and anxiety.  Her symptoms have included depressed mood, social withdrawal, lack of interest in activities, poor motivation, excessive  worry, feeling jittery, and ruminating thoughts.  Patient also has a history of alcohol dependence . Diagnoses: Maj. depressive disorder, recurrent, moderate; generalized anxiety disorder, alcohol dependence  Diagnosis:                  Axis I:  Major depressive disorder, recurrent, moderate  Generalized anxiety disorder                                                                                                  Axis II: No diagnosis        Calob Baskette, LCSW 08/05/2016

## 2016-08-10 ENCOUNTER — Encounter: Payer: Self-pay | Admitting: Family

## 2016-08-10 ENCOUNTER — Ambulatory Visit (INDEPENDENT_AMBULATORY_CARE_PROVIDER_SITE_OTHER): Payer: Medicare Other | Admitting: Family

## 2016-08-10 VITALS — BP 136/78 | HR 64 | Temp 96.9°F | Ht 62.0 in | Wt 130.2 lb

## 2016-08-10 DIAGNOSIS — R059 Cough, unspecified: Secondary | ICD-10-CM

## 2016-08-10 DIAGNOSIS — R05 Cough: Secondary | ICD-10-CM | POA: Diagnosis not present

## 2016-08-10 DIAGNOSIS — R350 Frequency of micturition: Secondary | ICD-10-CM | POA: Diagnosis not present

## 2016-08-10 DIAGNOSIS — R399 Unspecified symptoms and signs involving the genitourinary system: Secondary | ICD-10-CM

## 2016-08-10 LAB — URINALYSIS, COMPLETE
BILIRUBIN UA: NEGATIVE
GLUCOSE, UA: NEGATIVE
KETONES UA: NEGATIVE
Leukocytes, UA: NEGATIVE
Nitrite, UA: NEGATIVE
PROTEIN UA: NEGATIVE
RBC UA: NEGATIVE
SPEC GRAV UA: 1.02 (ref 1.005–1.030)
UUROB: 0.2 mg/dL (ref 0.2–1.0)
pH, UA: 5.5 (ref 5.0–7.5)

## 2016-08-10 LAB — MICROSCOPIC EXAMINATION
Epithelial Cells (non renal): >10 /hpf — AB (ref 0–10)
RBC, UA: NONE SEEN /hpf (ref 0–?)
Renal Epithel, UA: NONE SEEN /hpf

## 2016-08-10 NOTE — Patient Instructions (Signed)

## 2016-08-10 NOTE — Progress Notes (Signed)
   Subjective:    Patient ID: Alexandria Chen, female    DOB: 22-Mar-1935, 82 y.o.   MRN: [id number]  PT has lung mass that is followed Oncologists. PT reports increased congestion.  Urinary Frequency   The current episode started 1 to 4 weeks ago. The problem occurs intermittently. The problem has been waxing and waning. The pain is at a severity of 0/10. The patient is experiencing no pain. Associated symptoms include frequency, hesitancy and urgency. Pertinent negatives include no flank pain, hematuria, nausea or vomiting. She has tried increased fluids for the symptoms. The treatment provided mild relief.      Review of Systems  Gastrointestinal: Negative for nausea and vomiting.  Genitourinary: Positive for frequency, hesitancy and urgency. Negative for flank pain and hematuria.  All other systems reviewed and are negative.      Objective:   Physical Exam  Constitutional: She is oriented to person, place, and time. She appears well-developed and well-nourished. No distress.  HENT:  Head: Normocephalic.  Eyes: Pupils are equal, round, and reactive to light.  Neck: Normal range of motion. Neck supple. No thyromegaly present.  Cardiovascular: Normal rate, regular rhythm, normal heart sounds and intact distal pulses.   No murmur heard. Pulmonary/Chest: Effort normal. No respiratory distress. She has decreased breath sounds. She has no wheezes. She has rhonchi.  Abdominal: Soft. Bowel sounds are normal. She exhibits no distension. There is tenderness (mild lower abd ).  Musculoskeletal: Normal range of motion. She exhibits no edema or tenderness.  Neurological: She is alert and oriented to person, place, and time.  Skin: Skin is warm and dry.  Psychiatric: She has a normal mood and affect. Her behavior is normal. Judgment and thought content normal.  Vitals reviewed.   BP 136/78   Pulse 64   Temp (!) 96.9 F (36.1 C) (Oral)   Ht '5\' 2"'$  (1.575 m)   Wt 130 lb 3.2 oz (59.1 kg)   BMI  23.81 kg/m       Assessment & Plan:  1. Urine frequency - Urinalysis, Complete - Urine culture  2. UTI symptoms - Urine culture  3. Cough  PT unable to leave sample will bring back one Urine culture pending RTO prn   Evelina Dun, FNP

## 2016-08-11 ENCOUNTER — Ambulatory Visit (HOSPITAL_COMMUNITY): Payer: Medicare Other

## 2016-08-12 ENCOUNTER — Other Ambulatory Visit: Payer: Self-pay | Admitting: Family

## 2016-08-12 LAB — URINE CULTURE

## 2016-08-12 MED ORDER — NITROFURANTOIN MONOHYD MACRO 100 MG PO CAPS: 100.0000 mg | ORAL_CAPSULE | Freq: Two times a day (BID) | ORAL | 0 refills | Status: DC

## 2016-08-13 ENCOUNTER — Ambulatory Visit (INDEPENDENT_AMBULATORY_CARE_PROVIDER_SITE_OTHER): Payer: Medicare Other | Admitting: Nurse Practitioner

## 2016-08-13 ENCOUNTER — Encounter: Payer: Self-pay | Admitting: Nurse Practitioner

## 2016-08-13 VITALS — BP 139/81 | HR 59 | Ht 62.0 in | Wt 130.0 lb

## 2016-08-13 DIAGNOSIS — R42 Dizziness and giddiness: Secondary | ICD-10-CM | POA: Diagnosis not present

## 2016-08-13 NOTE — Progress Notes (Signed)
   Subjective:    Patient ID: Alexandria Chen, female    DOB: [birth year], 82 y.o.   MRN: [id number]  HPI Patient brought in my daughter with c/o dizziness x 2 weeks without headache or associated weakness.The patient was just recently seen inside of the office and began taking an antibiotic to treat UTI 2 days ago.   Review of Systems  Constitutional: Negative.   HENT: Negative for congestion, ear discharge and ear pain.   Eyes: Negative.   Respiratory: Negative.   Cardiovascular: Negative.   Gastrointestinal: Negative.   Genitourinary: Positive for frequency.       Currently being treated for UTI   Skin: Negative.   Neurological: Positive for dizziness and light-headedness. Negative for syncope, speech difficulty, weakness, numbness and headaches.  Psychiatric/Behavioral: Negative.   All other systems reviewed and are negative.      Objective:   Physical Exam  Constitutional: She is oriented to person, place, and time. She appears well-developed and well-nourished.  HENT:  Head: Normocephalic.  Right Ear: External ear normal.  Left Ear: External ear normal.  Eyes: Pupils are equal, round, and reactive to light.  Cardiovascular: Normal rate and regular rhythm.   Pulmonary/Chest: Effort normal and breath sounds normal.  Abdominal: Soft. Bowel sounds are normal.  Neurological: She is alert and oriented to person, place, and time.  Skin: Skin is warm and dry.  Psychiatric: She has a normal mood and affect. Her behavior is normal. Judgment and thought content normal.   BP 139/81   Pulse (!) 59   Ht '5\' 2"'$  (1.575 m)   Wt 130 lb (59 kg)   BMI 23.78 kg/m     Assessment & Plan:   1. Dizziness    Take Antivert PRN. Finish taking antibiotic for UTI. Change positions slowly, using assistance if needed, to decrease your chance for falling.   Mary-Margaret Hassell Done, FNP

## 2016-08-13 NOTE — Patient Instructions (Signed)

## 2016-08-16 ENCOUNTER — Telehealth (HOSPITAL_COMMUNITY): Payer: Self-pay | Admitting: Emergency Medicine

## 2016-08-16 NOTE — Telephone Encounter (Signed)
pts daughter called today to see if pts had something scheduled for 4/16.  I told her not that I could see in the whole epic system.  Went over the schedule for everybody that I could see for the pt.  Pts daughter seemed very over-whelmed.

## 2016-08-18 ENCOUNTER — Encounter: Payer: Self-pay | Admitting: Family

## 2016-08-18 ENCOUNTER — Ambulatory Visit (INDEPENDENT_AMBULATORY_CARE_PROVIDER_SITE_OTHER): Payer: Medicare Other | Admitting: Family

## 2016-08-18 VITALS — BP 152/84 | HR 65 | Temp 97.0°F | Ht 62.0 in | Wt 129.0 lb

## 2016-08-18 DIAGNOSIS — F419 Anxiety disorder, unspecified: Secondary | ICD-10-CM

## 2016-08-18 DIAGNOSIS — E785 Hyperlipidemia, unspecified: Secondary | ICD-10-CM

## 2016-08-18 DIAGNOSIS — E559 Vitamin D deficiency, unspecified: Secondary | ICD-10-CM

## 2016-08-18 DIAGNOSIS — F329 Major depressive disorder, single episode, unspecified: Secondary | ICD-10-CM

## 2016-08-18 DIAGNOSIS — I10 Essential (primary) hypertension: Secondary | ICD-10-CM

## 2016-08-18 DIAGNOSIS — R350 Frequency of micturition: Secondary | ICD-10-CM

## 2016-08-18 DIAGNOSIS — R918 Other nonspecific abnormal finding of lung field: Secondary | ICD-10-CM | POA: Diagnosis not present

## 2016-08-18 DIAGNOSIS — F32A Depression, unspecified: Secondary | ICD-10-CM

## 2016-08-18 LAB — URINALYSIS, COMPLETE
BILIRUBIN UA: NEGATIVE
Glucose, UA: NEGATIVE
KETONES UA: NEGATIVE
LEUKOCYTES UA: NEGATIVE
Nitrite, UA: NEGATIVE
PROTEIN UA: NEGATIVE
RBC UA: NEGATIVE
SPEC GRAV UA: 1.01 (ref 1.005–1.030)
Urobilinogen, Ur: 0.2 mg/dL (ref 0.2–1.0)
pH, UA: 6 (ref 5.0–7.5)

## 2016-08-18 LAB — MICROSCOPIC EXAMINATION
Bacteria, UA: NONE SEEN
RBC, UA: NONE SEEN /hpf (ref 0–?)
Renal Epithel, UA: NONE SEEN /hpf

## 2016-08-18 NOTE — Addendum Note (Signed)
Addended by: Pollyann Kennedy F on: 08/18/2016 04:11 PM   Modules accepted: Orders

## 2016-08-18 NOTE — Patient Instructions (Signed)
Atrial Fibrillation Atrial fibrillation is a type of irregular or rapid heartbeat (arrhythmia). In atrial fibrillation, the heart quivers continuously in a chaotic pattern. This occurs when parts of the heart receive disorganized signals that make the heart unable to pump blood normally. This can increase the risk for stroke, heart failure, and other heart-related conditions. There are different types of atrial fibrillation, including:  Paroxysmal atrial fibrillation. This type starts suddenly, and it usually stops on its own shortly after it starts.  Persistent atrial fibrillation. This type often lasts longer than a week. It may stop on its own or with treatment.  Long-lasting persistent atrial fibrillation. This type lasts longer than 12 months.  Permanent atrial fibrillation. This type does not go away. Talk with your health care provider to learn about the type of atrial fibrillation that you have. What are the causes? This condition is caused by some heart-related conditions or procedures, including:  A heart attack.  Coronary artery disease.  Heart failure.  Heart valve conditions.  High blood pressure.  Inflammation of the sac that surrounds the heart (pericarditis).  Heart surgery.  Certain heart rhythm disorders, such as Wolf-Parkinson-White syndrome. Other causes include:  Pneumonia.  Obstructive sleep apnea.  Blockage of an artery in the lungs (pulmonary embolism, or PE).  Lung cancer.  Chronic lung disease.  Thyroid problems, especially if the thyroid is overactive (hyperthyroidism).  Caffeine.  Excessive alcohol use or illegal drug use.  Use of some medicines, including certain decongestants and diet pills. Sometimes, the cause cannot be found. What increases the risk? This condition is more likely to develop in:  People who are older in age.  People who smoke.  People who have diabetes mellitus.  People who are overweight (obese).  Athletes  who exercise vigorously. What are the signs or symptoms? Symptoms of this condition include:  A feeling that your heart is beating rapidly or irregularly.  A feeling of discomfort or pain in your chest.  Shortness of breath.  Sudden light-headedness or weakness.  Getting tired easily during exercise. In some cases, there are no symptoms. How is this diagnosed? Your health care provider may be able to detect atrial fibrillation when taking your pulse. If detected, this condition may be diagnosed with:  An electrocardiogram (ECG).  A Holter monitor test that records your heartbeat patterns over a 24-hour period.  Transthoracic echocardiogram (TTE) to evaluate how blood flows through your heart.  Transesophageal echocardiogram (TEE) to view more detailed images of your heart.  A stress test.  Imaging tests, such as a CT scan or chest X-ray.  Blood tests. How is this treated? The main goals of treatment are to prevent blood clots from forming and to keep your heart beating at a normal rate and rhythm. The type of treatment that you receive depends on many factors, such as your underlying medical conditions and how you feel when you are experiencing atrial fibrillation. This condition may be treated with:  Medicine to slow down the heart rate, bring the heart's rhythm back to normal, or prevent clots from forming.  Electrical cardioversion. This is a procedure that resets your heart's rhythm by delivering a controlled, low-energy shock to the heart through your skin.  Different types of ablation, such as catheter ablation, catheter ablation with pacemaker, or surgical ablation. These procedures destroy the heart tissues that send abnormal signals. When the pacemaker is used, it is placed under your skin to help your heart beat in a regular rhythm. Follow these instructions  a regular rhythm.  Follow these instructions at home:  Take over-the counter and prescription medicines only as told by your  health care provider.  If your health care provider prescribed a blood-thinning medicine (anticoagulant), take it exactly as told. Taking too much blood-thinning medicine can cause bleeding. If you do not take enough blood-thinning medicine, you will not have the protection that you need against stroke and other problems.  Do not use tobacco products, including cigarettes, chewing tobacco, and e-cigarettes. If you need help quitting, ask your health care provider.  If you have obstructive sleep apnea, manage your condition as told by your health care provider.  Do not drink alcohol.  Do not drink beverages that contain caffeine, such as coffee, soda, and tea.  Maintain a healthy weight. Do not use diet pills unless your health care provider approves. Diet pills may make heart problems worse.  Follow diet instructions as told by your health care provider.  Exercise regularly as told by your health care provider.  Keep all follow-up visits as told by your health care provider. This is important. How is this prevented?  Avoid drinking beverages that contain caffeine or alcohol.  Avoid certain medicines, especially medicines that are used for breathing problems.  Avoid certain herbs and herbal medicines, such as those that contain ephedra or ginseng.  Do not use illegal drugs, such as cocaine and amphetamines.  Do not smoke.  Manage your high blood pressure. Contact a health care provider if:  You notice a change in the rate, rhythm, or strength of your heartbeat.  You are taking an anticoagulant and you notice increased bruising.  You tire more easily when you exercise or exert yourself. Get help right away if:  You have chest pain, abdominal pain, sweating, or weakness.  You feel nauseous.  You notice blood in your vomit, bowel movement, or urine.  You have shortness of breath.  You suddenly have swollen feet and ankles.  You feel dizzy.  You have sudden weakness or  numbness of the face, arm, or leg, especially on one side of the body.  You have trouble speaking, trouble understanding, or both (aphasia).  Your face or your eyelid droops on one side. These symptoms may represent a serious problem that is an emergency. Do not wait to see if the symptoms will go away. Get medical help right away. Call your local emergency services (911 in the [place] Do not drive yourself to the hospital. This information is not intended to replace advice given to you by your health care provider. Make sure you discuss any questions you have with your health care provider. Document Released: 04/19/2005 Document Revised: 08/27/2015 Document Reviewed: 08/14/2014 Elsevier Interactive Patient Education  2017 [employer]  

## 2016-08-18 NOTE — Addendum Note (Signed)
Addended by: Shelbie Ammons on: 08/18/2016 04:09 PM   Modules accepted: Orders

## 2016-08-18 NOTE — Progress Notes (Signed)
Subjective:    Patient ID: Alexandria Chen, female    DOB: 16-Apr-1935, 82 y.o.   MRN: [id number]  Pt presents to the office today for chronic follow up. Pt is followed by Psychologists every 3 months for GAD and Depression. . PT has follow up with Cardiologists on 08/20/16 for new onset A Fib. PT currently taking Eliqis.    PT has appt with Oncologists 08/27/16 for a new lung mass. Pt has not had lung biopsy yet and her anticoagulation will have to be adjusted when this occurs. Pt has SOB on exertion and nonproductive cough.  Hyperlipidemia  This is a chronic problem. The current episode started more than 1 year ago. The problem is uncontrolled. Recent lipid tests were reviewed and are high. She has no history of obesity. Associated symptoms include shortness of breath ( exertion). Current antihyperlipidemic treatment includes statins and herbal therapy. The current treatment provides moderate improvement of lipids. Risk factors for coronary artery disease include dyslipidemia and post-menopausal.  Hypertension  This is a chronic problem. The current episode started more than 1 year ago. The problem has been waxing and waning since onset. The problem is uncontrolled. Associated symptoms include anxiety and shortness of breath ( exertion). Pertinent negatives include no headaches, palpitations or peripheral edema. Risk factors for coronary artery disease include dyslipidemia, post-menopausal state and sedentary lifestyle. Past treatments include ACE inhibitors. The current treatment provides moderate improvement. There is no history of kidney disease, CAD/MI, CVA or heart failure.  Anxiety  Presents for follow-up visit. Symptoms include depressed mood, dizziness, excessive worry, irritability, nervous/anxious behavior, restlessness and shortness of breath ( exertion). Patient reports no palpitations. Symptoms occur most days.    Depression         This is a chronic problem.  The current episode started  more than 1 year ago.   The onset quality is gradual.   The problem occurs intermittently.  Associated symptoms include helplessness, restlessness and sad.  Associated symptoms include no hopelessness and no headaches.  Past treatments include TCAs - Tricyclic antidepressants.  Compliance with treatment is good.  Past medical history includes anxiety.   Dizziness  This is a recurrent problem. The problem occurs intermittently. The problem has been waxing and waning. Pertinent negatives include no headaches. The symptoms are aggravated by bending. She has tried rest for the symptoms. The treatment provided mild relief.  Urinary Frequency   This is a recurrent problem. The current episode started 1 to 4 weeks ago. The problem occurs intermittently. The problem has been waxing and waning. The patient is experiencing no pain. Associated symptoms include frequency.      Review of Systems  Constitutional: Positive for irritability.  Respiratory: Positive for shortness of breath ( exertion).   Cardiovascular: Negative for palpitations.  Genitourinary: Positive for frequency.  Neurological: Positive for dizziness. Negative for headaches.  Psychiatric/Behavioral: Positive for depression. The patient is nervous/anxious.   All other systems reviewed and are negative.      Objective:   Physical Exam  Constitutional: She is oriented to person, place, and time. She appears well-developed and well-nourished. No distress.  HENT:  Head: Normocephalic and atraumatic.  Right Ear: External ear normal.  Left Ear: External ear normal.  Nose: Nose normal.  Mouth/Throat: Oropharynx is clear and moist.  Eyes: Pupils are equal, round, and reactive to light.  Neck: Normal range of motion. Neck supple. No thyromegaly present.  Cardiovascular: Normal rate, regular rhythm, normal heart sounds and intact distal pulses.  No murmur heard. Pulmonary/Chest: Effort normal. No respiratory distress. She has decreased  breath sounds in the right middle field and the left middle field.  Abdominal: Soft. Bowel sounds are normal. She exhibits no distension. There is no tenderness.  Musculoskeletal: Normal range of motion. She exhibits no edema or tenderness.  Neurological: She is alert and oriented to person, place, and time. She has normal reflexes. No cranial nerve deficit.  Skin: Skin is warm and dry.  Psychiatric: She has a normal mood and affect. Her behavior is normal. Judgment and thought content normal.  Vitals reviewed.    BP (!) 177/82   Pulse 66   Temp 97 F (36.1 C) (Oral)   Ht '5\' 2"'$  (1.575 m)   Wt 129 lb (58.5 kg)   BMI 23.59 kg/m       Assessment & Plan:  1. Essential hypertension, benign  2. Mass of right lung  3. Vitamin D deficiency  4. Hyperlipidemia, unspecified hyperlipidemia type  5. Anxiety  6. Depression, unspecified depression type  7. Urinary frequency - Urinalysis, Complete - Urine culture   Continue all meds, keep all follow up with specialists!! Labs pending Health Maintenance reviewed Diet and exercise encouraged RTO 4 months   Evelina Dun, FNP

## 2016-08-19 ENCOUNTER — Ambulatory Visit (HOSPITAL_COMMUNITY): Payer: Self-pay

## 2016-08-20 ENCOUNTER — Telehealth: Payer: Self-pay | Admitting: Family

## 2016-08-20 ENCOUNTER — Ambulatory Visit: Payer: Medicare Other | Admitting: Family

## 2016-08-20 DIAGNOSIS — Z7901 Long term (current) use of anticoagulants: Secondary | ICD-10-CM | POA: Diagnosis not present

## 2016-08-20 DIAGNOSIS — Z87891 Personal history of nicotine dependence: Secondary | ICD-10-CM | POA: Diagnosis not present

## 2016-08-20 DIAGNOSIS — Z7982 Long term (current) use of aspirin: Secondary | ICD-10-CM | POA: Diagnosis not present

## 2016-08-20 DIAGNOSIS — I4891 Unspecified atrial fibrillation: Secondary | ICD-10-CM | POA: Diagnosis not present

## 2016-08-20 DIAGNOSIS — R001 Bradycardia, unspecified: Secondary | ICD-10-CM | POA: Diagnosis not present

## 2016-08-20 NOTE — Telephone Encounter (Signed)
Patients daughter aware and will get her scheduled.

## 2016-08-20 NOTE — Telephone Encounter (Signed)
I did not realize she was getting transitioned to warfarin, I can see that now on the cardiologist's notes. Please have her come establish with [name] get her INR checked as soon as possible

## 2016-08-21 LAB — URINE CULTURE

## 2016-08-23 ENCOUNTER — Other Ambulatory Visit: Payer: Self-pay | Admitting: Family

## 2016-08-23 ENCOUNTER — Telehealth: Payer: Self-pay | Admitting: *Deleted

## 2016-08-23 DIAGNOSIS — R350 Frequency of micturition: Secondary | ICD-10-CM | POA: Diagnosis not present

## 2016-08-23 DIAGNOSIS — I481 Persistent atrial fibrillation: Secondary | ICD-10-CM | POA: Diagnosis not present

## 2016-08-23 DIAGNOSIS — R918 Other nonspecific abnormal finding of lung field: Secondary | ICD-10-CM | POA: Diagnosis not present

## 2016-08-23 DIAGNOSIS — Z8679 Personal history of other diseases of the circulatory system: Secondary | ICD-10-CM | POA: Diagnosis not present

## 2016-08-23 DIAGNOSIS — Z7901 Long term (current) use of anticoagulants: Secondary | ICD-10-CM | POA: Diagnosis not present

## 2016-08-23 DIAGNOSIS — Z7982 Long term (current) use of aspirin: Secondary | ICD-10-CM | POA: Diagnosis not present

## 2016-08-23 MED ORDER — CIPROFLOXACIN HCL 500 MG PO TABS
500.0000 mg | ORAL_TABLET | Freq: Two times a day (BID) | ORAL | 0 refills | Status: DC
Start: 2016-08-23 — End: 2016-08-23

## 2016-08-23 NOTE — Telephone Encounter (Signed)
Patient is on amiodarone which will not work with ciproflaxin.  Please send in different script to [facility]. Thanks

## 2016-08-24 ENCOUNTER — Other Ambulatory Visit: Payer: Self-pay | Admitting: Radiology

## 2016-08-24 ENCOUNTER — Encounter: Payer: Self-pay | Admitting: Pharmacist

## 2016-08-24 ENCOUNTER — Other Ambulatory Visit: Payer: Self-pay | Admitting: Family

## 2016-08-24 MED ORDER — CIPROFLOXACIN HCL 500 MG PO TABS: 500.0000 mg | ORAL_TABLET | Freq: Two times a day (BID) | ORAL | 0 refills | Status: DC

## 2016-08-24 NOTE — Telephone Encounter (Signed)
We will start Cipro and bring pt back for EKG after starting medication .

## 2016-08-25 ENCOUNTER — Telehealth: Payer: Self-pay | Admitting: Family

## 2016-08-25 NOTE — Telephone Encounter (Signed)
Daughter has not started her mom on the cipro because she is concerned for her heart issues. Advised to start medication because of infection which can cause her mother major issues if not treated.  Advised to keep appointment on 08-26-16 here for ECG and inform specialist that patient will see Friday about antibiotic and infection.

## 2016-08-26 ENCOUNTER — Ambulatory Visit (INDEPENDENT_AMBULATORY_CARE_PROVIDER_SITE_OTHER): Payer: Medicare Other | Admitting: Psychiatry

## 2016-08-26 ENCOUNTER — Ambulatory Visit (INDEPENDENT_AMBULATORY_CARE_PROVIDER_SITE_OTHER): Payer: Medicare Other | Admitting: Family

## 2016-08-26 ENCOUNTER — Other Ambulatory Visit: Payer: Self-pay | Admitting: General Surgery

## 2016-08-26 ENCOUNTER — Encounter: Payer: Self-pay | Admitting: Family

## 2016-08-26 VITALS — BP 128/78 | HR 71 | Temp 98.6°F | Ht 62.0 in | Wt 131.6 lb

## 2016-08-26 DIAGNOSIS — N3000 Acute cystitis without hematuria: Secondary | ICD-10-CM

## 2016-08-26 DIAGNOSIS — F331 Major depressive disorder, recurrent, moderate: Secondary | ICD-10-CM

## 2016-08-26 DIAGNOSIS — F419 Anxiety disorder, unspecified: Secondary | ICD-10-CM | POA: Diagnosis not present

## 2016-08-26 DIAGNOSIS — I4891 Unspecified atrial fibrillation: Secondary | ICD-10-CM | POA: Diagnosis not present

## 2016-08-26 NOTE — Progress Notes (Signed)
Alexandria Chen           DOB:                               Mar 21, 1935  MR Number:                  [id number]  Location:                        [facility]:  [address] [place],  [place], [address]  Start:                              Thursday 08/26/2016 10:27 AM End:                               Thursday 08/26/2016 11:10 AM          Provider/Observer:                           Maurice Small, MSW, LCSW   Chief Complaint:                                   Chief Complaint  Patient presents with  . Anxiety    Reason For Service:                                     The patient is a 82 year old African American female who has a long-standing history of recurrent periods of symptoms of depression and anxiety. Patient also has a history of alcohol abuse/dependence and was hospitalized in July 2013 due to relapse and drinking approximately a 6 pack of beer daily along with experiencing increased symptoms of depression and anxiety. This was patient's third psychiatric hospitalization. Patient last was seen in February 2015. She is resuming services as she reports she resumed alcohol use in November 2017 and has been drinking one can of beer nearly every day since that time to try to calm her nerves. She wants to improve coping skills and avoid going to detox. She reports increased anxiety, excessive worry, memory difficulty,poor motivation, and fatigue.   Interventions Strategy:                    Supportive therapy, cognitive behavioral therapy  Participation Level:                           Active  Participation Quality:                       Appropriate                            Behavioral Observation:                   less fidgety, talkative, cooperative, anxious,   Current Psychosocial Factors:        Mass on lung was recently discovered, patient's 63 year-old grandson and his 54 year old daughter are residing with patient (stressful as they can both  be  argumentative and don't clean up after themselves per patient's report), concerns about her own health as she has lost her hearing aid, needs glasses, and has some difficulty walking but is going to physical therapy, concerns about youngest daughter who has health issues,    Content of Session:                          Reviewed symptoms, facilitated expression of thoughts and feelings regarding her health, assisted patient identify and verbalize her fears about her health, discussed patient's concerns about interaction with her feeling, assisted patient identify ways to improve assertive communication with family to express her concerns/needs, worked with patient and daughter together and facilitated patient expressing her concerns/needs using assertive communication, assisted patient and daughter identify ways to increase empathic communication skills,   Current Status:                                   depressed mood, anxiety, excessive worry, fatigue    Patient Progress:                              Fair. The patient reports continued anxiety and worry about her health. She reports sleeping well at night but continued worrying during the day.  She expresses fear of biopsy tomorrow and verbalizes today in session she fears she may have cancer and may not survive it. She says she hasn't really talked about it with anyone. She says she would like to be able to talk about it with her family but says they act like they don't want to talk about it. She also reports children also use harsh tones with her, expect her to do more than she thinks she can do. She says this makes her more nervous. She reports increased memory difficulty and says children fuss at her when she forgets to take her medication. She states wanting children to be more patient with her but says she hasn't talked with them about this. Patient and therapist agreed to bring daughter in session to discuss concerns and facilitate improved  communication and support for patient. Daughter shares in session that patient had been told she has cancer and the biopsy tomorrow is to determine how to treat it. Patient reports she forgot about that conversation and expresses some confusion.   Target Goals:                        1. Learn and implement behavioral strategies to overcome depression.      2. Learn and implement calming skills.        3. Identify, challenge, and replace negative self talk that evokes anxiety with healthy alternatives.   Last Reviewed:   06/07/2016                                                  Goals Addressed Today:                 1,2,  Impression/Diagnosis:                     The patient has a long-standing history of recurrent  periods of depression and anxiety.  Her symptoms have included depressed mood, social withdrawal, lack of interest in activities, poor motivation, excessive  worry, feeling jittery, and ruminating thoughts.  Patient also has a history of alcohol dependence . Diagnoses: Maj. depressive disorder, recurrent, moderate; generalized anxiety disorder, alcohol dependence  Diagnosis:                  Axis I:  Major depressive disorder, recurrent, moderate  Generalized anxiety disorder                                                                                                  Axis II: No diagnosis        Alexandria Asencio, LCSW 08/26/2016

## 2016-08-26 NOTE — Patient Instructions (Signed)
Urinary Frequency, Adult Urinary frequency means urinating more often than usual. People with urinary frequency urinate at least 8 times in 24 hours, even if they drink a normal amount of fluid. Although they urinate more often than normal, the total amount of urine produced in a day may be normal. Urinary frequency is also called pollakiuria. What are the causes? This condition may be caused by:  A urinary tract infection.  Obesity.  Bladder problems, such as bladder stones.  Caffeine or alcohol.  Eating food or drinking fluids that irritate the bladder. These include coffee, tea, soda, artificial sweeteners, citrus, tomato-based foods, and chocolate.  Certain medicines, such as medicines that help the body get rid of extra fluid (diuretics).  Muscle or nerve weakness.  Overactive bladder.  Chronic diabetes.  Interstitial cystitis.  In men, problems with the prostate, such as an enlarged prostate.  In women, pregnancy. In some cases, the cause may not be known. What increases the risk? This condition is more likely to develop in:  Women who have gone through menopause.  Men with prostate problems.  People with a disease or injury that affects the nerves or spinal cord.  People who have or have had a condition that affects the brain, such as a stroke. What are the signs or symptoms? Symptoms of this condition include:  Feeling an urgent need to urinate often. The stress and anxiety of needing to find a bathroom quickly can make this urge worse.  Urinating 8 or more times in 24 hours.  Urinating as often as every 1 to 2 hours. How is this diagnosed? This condition is diagnosed based on your symptoms, your medical history, and a physical exam. You may have tests, such as:  Blood tests.  Urine tests.  Imaging tests, such as X-rays or ultrasounds.  A bladder test.  A test of your neurological system. This is the body system that senses the need to urinate.  A  test to check for problems in the urethra and bladder called cystoscopy. You may also be asked to keep a bladder diary. A bladder diary is a record of what you eat and drink, how often you urinate, and how much you urinate. You may need to see a health care provider who specializes in conditions of the urinary tract (urologist) or kidneys (nephrologist). How is this treated? Treatment for this condition depends on the cause. Sometimes the condition goes away on its own and treatment is not necessary. If treatment is needed, it may include:  Taking medicine.  Learning exercises that strengthen the muscles that help control urination.  Following a bladder training program. This may include:  Learning to delay going to the bathroom.  Double urinating (voiding). This helps if you are not completely emptying your bladder.  Scheduled voiding.  Making diet changes, such as:  Avoiding caffeine.  Drinking fewer fluids, especially alcohol.  Not drinking in the evening.  Not having foods or drinks that may irritate the bladder.  Eating foods that help prevent or ease constipation. Constipation can make this condition worse.  Having the nerves in your bladder stimulated. There are two options for stimulating the nerves to your bladder:  Outpatient electrical nerve stimulation. This is done by your health care provider.  Surgery to implant a bladder pacemaker. The pacemaker helps to control the urge to urinate. Follow these instructions at home:  Keep a bladder diary if told to by your health care provider.  Take over-the-counter and prescription medicines only as   told by your health care provider.  Do any exercises as told by your health care provider.  Follow a bladder training program as told by your health care provider.  Make any recommended diet changes.  Keep all follow-up visits as told by your health care provider. This is important. Contact a health care provider  if:  You start urinating more often.  You feel pain or irritation when you urinate.  You notice blood in your urine.  Your urine looks cloudy.  You develop a fever.  You begin vomiting. Get help right away if:  You are unable to urinate. This information is not intended to replace advice given to you by your health care provider. Make sure you discuss any questions you have with your health care provider. Document Released: 02/13/2009 Document Revised: 05/21/2015 Document Reviewed: 11/13/2014 Elsevier Interactive Patient Education  2017 [employer] Inc.  

## 2016-08-26 NOTE — Progress Notes (Signed)
   Subjective:    Patient ID: Alexandria Chen, female    DOB: 01-31-1935, 82 y.o.   MRN: [id number]  HPI Pt presents to the office today for EKG. PT had a positive UTI for pseudomonas aeruginosa. PT  currently taking amiodarone. Only PO option to treat her UTI was cipro, but has drug interaction with amiodarone. Pt saw her Cardiologists on Monday and had IV fluids for dehydration. Cardiologists told patient to stop amiodarone while taking cipro. Pt denies any new SOB or palpitations.   Pt is scheduled for lung biopsy  tomorrow for lung mass.    Review of Systems  Respiratory: Positive for cough and shortness of breath.   Psychiatric/Behavioral:       Anxious   All other systems reviewed and are negative.      Objective:   Physical Exam  Constitutional: She is oriented to person, place, and time. She appears well-developed and well-nourished. No distress.  HENT:  Head: Normocephalic.  Cardiovascular: Normal rate, regular rhythm, normal heart sounds and intact distal pulses.   No murmur heard. Pulmonary/Chest: Effort normal. No respiratory distress. She has decreased breath sounds. She has no wheezes.  Abdominal: Soft. Bowel sounds are normal. She exhibits no distension. There is no tenderness.  Musculoskeletal: Normal range of motion. She exhibits no edema or tenderness.  Generalized weakness   Neurological: She is alert and oriented to person, place, and time.  Skin: Skin is warm and dry.  Psychiatric: She has a normal mood and affect. Her behavior is normal. Judgment and thought content normal.  Vitals reviewed.     BP 128/78   Pulse 71   Temp 98.6 F (37 C) (Oral)   Ht '5\' 2"'$  (1.575 m)   Wt 131 lb 9.6 oz (59.7 kg)   BMI 24.07 kg/m      Assessment & Plan:  1. Acute cystitis without hematuria  2. Atrial fibrillation, unspecified type ([place])   Continue Cipro Keep all Cardiologists appts Keep Oncologists appt- Lung biopsy scheduled tomorrow RTO prn and keep all  follow up   Evelina Dun, FNP

## 2016-08-27 ENCOUNTER — Ambulatory Visit (HOSPITAL_COMMUNITY)
Admission: RE | Admit: 2016-08-27 | Discharge: 2016-08-27 | Disposition: A | Discharge status: Home / Self Care | Payer: Medicare Other | Source: Ambulatory Visit | Attending: Interventional Radiology | Admitting: Interventional Radiology

## 2016-08-27 ENCOUNTER — Ambulatory Visit (HOSPITAL_COMMUNITY)
Admission: RE | Admit: 2016-08-27 | Discharge: 2016-08-27 | Disposition: A | Discharge status: Home / Self Care | Payer: Medicare Other | Source: Ambulatory Visit | Attending: Oncology | Admitting: Oncology

## 2016-08-27 DIAGNOSIS — R911 Solitary pulmonary nodule: Secondary | ICD-10-CM | POA: Diagnosis not present

## 2016-08-27 DIAGNOSIS — I4891 Unspecified atrial fibrillation: Secondary | ICD-10-CM | POA: Diagnosis not present

## 2016-08-27 DIAGNOSIS — Z87891 Personal history of nicotine dependence: Secondary | ICD-10-CM | POA: Insufficient documentation

## 2016-08-27 DIAGNOSIS — M8580 Other specified disorders of bone density and structure, unspecified site: Secondary | ICD-10-CM | POA: Insufficient documentation

## 2016-08-27 DIAGNOSIS — F1011 Alcohol abuse, in remission: Secondary | ICD-10-CM | POA: Diagnosis not present

## 2016-08-27 DIAGNOSIS — F419 Anxiety disorder, unspecified: Secondary | ICD-10-CM | POA: Insufficient documentation

## 2016-08-27 DIAGNOSIS — R0602 Shortness of breath: Secondary | ICD-10-CM | POA: Diagnosis not present

## 2016-08-27 DIAGNOSIS — I10 Essential (primary) hypertension: Secondary | ICD-10-CM | POA: Diagnosis not present

## 2016-08-27 DIAGNOSIS — C3411 Malignant neoplasm of upper lobe, right bronchus or lung: Secondary | ICD-10-CM | POA: Diagnosis not present

## 2016-08-27 DIAGNOSIS — R918 Other nonspecific abnormal finding of lung field: Secondary | ICD-10-CM

## 2016-08-27 DIAGNOSIS — F329 Major depressive disorder, single episode, unspecified: Secondary | ICD-10-CM | POA: Diagnosis not present

## 2016-08-27 DIAGNOSIS — R05 Cough: Secondary | ICD-10-CM | POA: Diagnosis not present

## 2016-08-27 LAB — PROTIME-INR
INR: 1.14
PROTHROMBIN TIME: 14.6 s (ref 11.4–15.2)

## 2016-08-27 LAB — CBC
HEMATOCRIT: 38.9 % (ref 36.0–46.0)
HEMOGLOBIN: 12.8 g/dL (ref 12.0–15.0)
MCH: 29.6 pg (ref 26.0–34.0)
MCHC: 32.9 g/dL (ref 30.0–36.0)
MCV: 89.8 fL (ref 78.0–100.0)
Platelets: 270 10*3/uL (ref 150–400)
RBC: 4.33 MIL/uL (ref 3.87–5.11)
RDW: 14.3 % (ref 11.5–15.5)
WBC: 5.1 10*3/uL (ref 4.0–10.5)

## 2016-08-27 LAB — APTT: APTT: 34 s (ref 24–36)

## 2016-08-27 MED ORDER — FENTANYL CITRATE (PF) 100 MCG/2ML IJ SOLN
INTRAMUSCULAR | Status: AC | PRN
  Administered 2016-08-27 (×2): 25 ug via INTRAVENOUS
  Administered 2016-08-27: 50 ug via INTRAVENOUS

## 2016-08-27 MED ORDER — MIDAZOLAM HCL 2 MG/2ML IJ SOLN
INTRAMUSCULAR | Status: AC
  Filled 2016-08-27: qty 2

## 2016-08-27 MED ORDER — FENTANYL CITRATE (PF) 100 MCG/2ML IJ SOLN
INTRAMUSCULAR | Status: AC
  Filled 2016-08-27: qty 2

## 2016-08-27 MED ORDER — MIDAZOLAM HCL 2 MG/2ML IJ SOLN
INTRAMUSCULAR | Status: AC | PRN
  Administered 2016-08-27 (×2): 1 mg via INTRAVENOUS

## 2016-08-27 MED ORDER — LIDOCAINE HCL 1 % IJ SOLN
INTRAMUSCULAR | Status: AC
  Filled 2016-08-27: qty 20

## 2016-08-27 MED ORDER — SODIUM CHLORIDE 0.9 % IV SOLN: INTRAVENOUS | Status: DC

## 2016-08-27 NOTE — H&P (Signed)
Chief Complaint: Patient was seen in consultation today for lung mass  Referring Physician(s):  [facility]  Supervising Physician: Corrie Mckusick  Patient Status: [facility] - Out-pt  History of Present Illness: Alexandria Chen is a 82 y.o. female with past medical history of anxiety, HTN, a fib on Elliquis as well as amiodarone who presents with history of cough and shortness of breath.   CT Chest 06/25/16 shows: 1. 4.9 x 3.3 cm mass in the medial aspect of the right upper lobe which demonstrates evidence of potential direct mediastinal invasion, and pathologically enlarged right hilar and right paratracheal lymph nodes which suggest nodal metastatic disease. These findings are highly concerning for primary bronchogenic carcinoma and further evaluation with PET-CT is strongly recommended in the near future for further diagnostic and staging purposes. 2. There is also nodularity in the adrenal glands bilaterally, which could reflect additional sites of metastatic disease. Attention at time of forthcoming PET-CT is recommended. 3. Indeterminate lesion in the right lobe of the liver, favored to represent a cavernous hemangioma. Should this lesion prove to be hypermetabolic on PET imaging, further characterization with MRI of the abdomen with and without IV gadolinium could provide definitive characterization.  PET scan 07/07/16 shows: 4.7 cm mass in the medial right upper lobe abutting/invading the mediastinum, suspicious for primary bronchogenic neoplasm.  Possible mild mediastinal/right hilar nodal metastases, equivocal.  No evidence of distant metastases.  3.6 x 2.3 cm subcapsular lesion in the inferior right liver is non FDG avid, likely reflecting a benign hemangioma when correlating with prior CT.  IR consulted for percutaneous lung biopsy at the request of Hassell Halim.  Case reviewed by Dr. Earleen Newport who approves patient for procedure.   She has been NPO.  She has  held her Elliquis for 2 days.  She presents today feeling well, however of note, was recently diagnosed with a UTI and has been on Cipro since Wednesday.  Past Medical History:  Diagnosis Date  . Alcohol abuse, in remission   . Anxiety   . Depression   . HTN (hypertension)   . Mass of right lung 07/13/2016  . Osteopenia     No past surgical history on file.  Allergies: Patient has no known allergies.  Medications: Prior to Admission medications   Medication Sig Start Date End Date Taking? Authorizing Provider  amiodarone (PACERONE) 200 MG tablet Take 200 mg by mouth daily.  08/04/16  Yes Historical Provider, MD  apixaban (ELIQUIS) 5 MG TABS tablet Take 2.5 mg by mouth 2 (two) times daily.  07/16/16  Yes Historical Provider, MD  atorvastatin (LIPITOR) 40 MG tablet TAKE 1 TABLET (40 MG TOTAL) BY MOUTH DAILY. FOR HYPERLIPIDEMIA 05/29/16  Yes [name], FNP  busPIRone (BUSPAR) 15 MG tablet Take 1 tablet (15 mg total) by mouth 2 (two) times daily. 08/02/16  Yes Cloria Spring, MD  Calcium Carb-Cholecalciferol (CALCIUM 500+D3) 500-400 MG-UNIT TABS Take 1 tablet by mouth 2 (two) times daily.   Yes Historical Provider, MD  ciprofloxacin (CIPRO) 500 MG tablet Take 1 tablet (500 mg total) by mouth 2 (two) times daily. 08/24/16  Yes [name], FNP  DULoxetine (CYMBALTA) 60 MG capsule Take 1 capsule (60 mg total) by mouth daily. 08/02/16 08/02/17 Yes Cloria Spring, MD  fish oil-omega-3 fatty acids 1000 MG capsule Take 3 capsules (3 g total) by mouth daily. For hyperlipidemia. Patient taking differently: Take 1 g by mouth 3 (three) times daily. For hyperlipidemia. 10/19/11  Yes Milta Deiters  T Mashburn, PA-C  fluticasone (FLONASE) 50 MCG/ACT nasal spray Place 2 sprays into both nostrils 2 (two) times daily. Use as needed Patient taking differently: Place 2 sprays into both nostrils 2 (two) times daily as needed for allergies. Use as needed 11/05/15  Yes [name], FNP  gabapentin (NEURONTIN) 100 MG  capsule Take 1 capsule (100 mg total) by mouth at bedtime. 08/02/16  Yes Cloria Spring, MD  lisinopril (PRINIVIL,ZESTRIL) 5 MG tablet Take 5 mg by mouth. 08/25/16  Yes Historical Provider, MD  meclizine (ANTIVERT) 25 MG tablet Take 25 mg by mouth 3 (three) times daily as needed for dizziness.   Yes Historical Provider, MD  nitroGLYCERIN (NITROSTAT) 0.4 MG SL tablet Place 0.4 mg under the tongue. 07/13/16   Historical Provider, MD     Family History  Problem Relation Age of Onset  . Anxiety disorder Mother   . Dementia Mother   . Hypertension Mother   . Alcohol abuse Father   . Arthritis Father   . Alcohol abuse Brother   . ADD / ADHD Neg Hx   . Drug abuse Neg Hx   . Bipolar disorder Neg Hx   . Depression Neg Hx   . OCD Neg Hx   . Paranoid behavior Neg Hx   . Schizophrenia Neg Hx   . Seizures Neg Hx   . Sexual abuse Neg Hx   . Physical abuse Neg Hx     Social History   Social History  . Marital status: Widowed    Spouse name: N/A  . Number of children: N/A  . Years of education: N/A   Social History Main Topics  . Smoking status: Former Smoker    Packs/day: 2.00    Years: 60.00    Types: Cigarettes    Start date: 05/03/1956    Quit date: 07/15/2016  . Smokeless tobacco: Never Used  . Alcohol use No     Comment: Patient reports no alcohol use since 05/24/2016.  . Drug use: No  . Sexual activity: No   Other Topics Concern  . Not on file   Social History Narrative  . No narrative on file    Review of Systems  Constitutional: Negative for fatigue and fever.  Respiratory: Positive for cough (chronic) and shortness of breath (chronic).   Psychiatric/Behavioral: Negative for behavioral problems and confusion.    Vital Signs: BP (!) 153/71 (BP Location: Right Arm)   Pulse 63   Temp 98.2 F (36.8 C) (Oral)   Ht '5\' 2"'$  (1.575 m)   Wt 131 lb (59.4 kg)   SpO2 100%   BMI 23.96 kg/m   Physical Exam  Constitutional: She is oriented to person, place, and time. She  appears well-developed.  Cardiovascular: Normal rate, regular rhythm and normal heart sounds.   Pulmonary/Chest: Effort normal and breath sounds normal. No respiratory distress.  Neurological: She is alert and oriented to person, place, and time.  Skin: Skin is warm and dry.  Psychiatric: She has a normal mood and affect. Her behavior is normal. Judgment and thought content normal.  Nursing note and vitals reviewed.   Mallampati Score:  MD Evaluation Airway: WNL Heart: WNL Abdomen: WNL Chest/ Lungs: WNL ASA  Classification: 3 Mallampati/Airway Score: Two  Imaging: No results found.  Labs:  CBC:  Recent Labs  09/04/15 1700 10/19/15 1410  06/24/16 1317 07/19/16 1519 07/29/16 1327 08/27/16 0952  WBC 8.5 6.0  < > 6.0 5.9 6.3 5.1  HGB 13.5 12.1  --   --   --  12.7 12.8  HCT 40.7 35.6*  < > 39.0 35.6 37.9 38.9  PLT 283 281  < > 299 358 313 270  < > = values in this interval not displayed.  COAGS:  Recent Labs  08/27/16 0952  INR 1.14  APTT 34    BMP:  Recent Labs  05/21/16 1449 06/24/16 1317 07/19/16 1519 07/29/16 1327  NA 140 141 138 137  K 4.0 4.2 3.9 3.9  CL 97 99 98 102  CO2 '28 29 25 30  '$ GLUCOSE 86 76 78 94  BUN '15 18 9 12  '$ CALCIUM 10.0 10.3 9.6 9.6  CREATININE 0.75 0.77 0.82 0.85  GFRNONAA 76 74 68 >60  GFRAA 88 85 79 >60    LIVER FUNCTION TESTS:  Recent Labs  05/21/16 1449 06/24/16 1317 07/19/16 1519 07/29/16 1327  BILITOT 0.3 0.3 0.3 0.7  AST '13 14 13 16  '$ ALT '8 8 9 '$ 13*  ALKPHOS 70 73 74 67  PROT 6.6 7.0 6.8 7.6  ALBUMIN 4.0 4.0 3.7 3.7    TUMOR MARKERS: No results for input(s): AFPTM, CEA, CA199, CHROMGRNA in the last 8760 hours.  Assessment and Plan: Patient with history of anxiety/depression, HTN, and a fib presents with recent finding of lung mass identified by CT and PET scan.  IR consulted for lung mass biopsy at the request of Hassell Halim. Case approved by Dr. Earleen Newport. Patient has been NPO.  She has held her Elliquis  appropriately.  She does have a UTI, but has been on antibiotics as directed and is feeling well today.  Risks and benefits discussed with the patient including, but not limited to bleeding, hemoptysis, respiratory failure requiring intubation, infection, pneumothorax requiring chest tube placement, stroke from air embolism or even death. All of the patient's questions were answered, patient is agreeable to proceed. Consent signed and in chart.  Thank you for this interesting consult.  I greatly enjoyed meeting Alexandria Chen and look forward to participating in their care.  A copy of this report was sent to the requesting provider on this date.  Electronically Signed: Docia Barrier 08/27/2016, 10:43 AM   I spent a total of  30 Minutes   in face to face in clinical consultation, greater than 50% of which was counseling/coordinating care for lung mass.

## 2016-08-27 NOTE — Sedation Documentation (Signed)
Bandaid R upper chest intact

## 2016-08-27 NOTE — Sedation Documentation (Signed)
Patient is resting comfortably. 

## 2016-08-27 NOTE — Procedures (Signed)
Anterior RUL mass  s/p RUL mass CT core bx  No comp Stable EBL 0 Full report in pacs Path pending

## 2016-08-27 NOTE — Discharge Instructions (Signed)
Needle Biopsy of the Lung, Care After °This sheet gives you information about how to care for yourself after your procedure. Your health care provider may also give you more specific instructions. If you have problems or questions, contact your health care provider. °What can I expect after the procedure? °After the procedure, it is common to have: °· Soreness, pain, and tenderness where a tissue sample was taken (biopsy site). °· A cough. °· A sore throat. ° °Follow these instructions at home: °Biopsy site care °· Follow instructions from your health care provider about when to remove the bandage that was placed on the biopsy site. °· Keep the bandage dry until it has been removed. °· Check your biopsy site every day for signs of infection. Check for: °? More redness, swelling, or pain. °? More fluid or blood. °? Warmth to the touch. °? Pus or a bad smell. °General instructions °· Rest as directed by your health care provider. Ask your health care provider what activities are safe for you. °· Do not take baths, swim, or use a hot tub until your health care provider approves. °· Take over-the-counter and prescription medicines only as told by your health care provider. °· If you have airplane travel scheduled, talk with your health care provider about when it is safe for you to travel by airplane. °· It is up to you to get the results of your procedure. Ask your health care provider, or the department that is doing the procedure, when your results will be ready. °· Keep all follow-up visits as told by your health care provider. This is important. °Contact a health care provider if: °· You have more redness, swelling, or pain around your biopsy site. °· You have more fluid or blood coming from your biopsy site. °· Your biopsy site feels warm to the touch. °· You have pus or a bad smell coming from your biopsy site. °· You have a fever. °· You have pain that does not get better with medicine. °Get help right away  if: °· You have problems breathing. °· You have chest pain. °· You cough up blood. °· You faint. °· You have a fast heart rate. °Summary °· After a needle biopsy of the lung, it is common to have a cough, a sore throat, or soreness, pain, and tenderness where a tissue sample was taken (biopsy site). °· You should check your biopsy area every day for signs of infection, including pus or a bad smell, warmth, more fluid or blood, or more redness, swelling, or pain. °· You should not take baths, swim, or use a hot tub until your health care provider approves. °· It is up to you to get the results of your procedure. Ask your health care provider, or the department that is doing the procedure, when your results will be ready. °This information is not intended to replace advice given to you by your health care provider. Make sure you discuss any questions you have with your health care provider. °Document Released: 02/14/2007 Document Revised: 03/10/2016 Document Reviewed: 03/10/2016 °Elsevier Interactive Patient Education © 2017 [employer] ° ° °

## 2016-08-30 ENCOUNTER — Encounter (HOSPITAL_COMMUNITY): Payer: Self-pay | Admitting: Oncology

## 2016-09-02 DIAGNOSIS — T462X5S Adverse effect of other antidysrhythmic drugs, sequela: Secondary | ICD-10-CM | POA: Diagnosis not present

## 2016-09-02 DIAGNOSIS — R9431 Abnormal electrocardiogram [ECG] [EKG]: Secondary | ICD-10-CM | POA: Diagnosis not present

## 2016-09-03 DIAGNOSIS — R9431 Abnormal electrocardiogram [ECG] [EKG]: Secondary | ICD-10-CM | POA: Diagnosis not present

## 2016-09-06 ENCOUNTER — Encounter (HOSPITAL_COMMUNITY): Payer: Self-pay

## 2016-09-06 ENCOUNTER — Encounter (HOSPITAL_COMMUNITY): Payer: Medicare Other | Attending: Oncology | Admitting: Oncology

## 2016-09-06 VITALS — BP 144/88 | HR 69 | Temp 98.4°F | Resp 18 | Wt 132.0 lb

## 2016-09-06 DIAGNOSIS — R918 Other nonspecific abnormal finding of lung field: Secondary | ICD-10-CM | POA: Insufficient documentation

## 2016-09-06 DIAGNOSIS — C3491 Malignant neoplasm of unspecified part of right bronchus or lung: Secondary | ICD-10-CM

## 2016-09-06 MED ORDER — LIDOCAINE-PRILOCAINE 2.5-2.5 % EX CREA: TOPICAL_CREAM | CUTANEOUS | 3 refills | Status: DC

## 2016-09-06 MED ORDER — DEXAMETHASONE 4 MG PO TABS: 8.0000 mg | ORAL_TABLET | Freq: Every day | ORAL | 1 refills | Status: DC

## 2016-09-06 MED ORDER — PROCHLORPERAZINE MALEATE 10 MG PO TABS: 10.0000 mg | ORAL_TABLET | Freq: Four times a day (QID) | ORAL | 1 refills | Status: DC | PRN

## 2016-09-06 MED ORDER — ONDANSETRON HCL 8 MG PO TABS: 8.0000 mg | ORAL_TABLET | Freq: Two times a day (BID) | ORAL | 1 refills | Status: DC | PRN

## 2016-09-06 MED ORDER — LORAZEPAM 0.5 MG PO TABS: 0.5000 mg | ORAL_TABLET | Freq: Four times a day (QID) | ORAL | 0 refills | Status: DC | PRN

## 2016-09-06 NOTE — Addendum Note (Signed)
Addended by: Elenor Legato on: 09/06/2016 04:39 PM   Modules accepted: Orders

## 2016-09-06 NOTE — Progress Notes (Signed)
[facility] Hematology/Oncology Progress Note   Name: Alexandria Chen      MRN: [id number]    Location: Room/bed info not found  Date: 09/06/2016 Time:3:00 PM   REFERRING PHYSICIAN:  Evelina Dun, FNP (Primary Care Provider)  REASON FOR CONSULT:  4.9 x 3.3 cm RUL pulmonary mass.    DIAGNOSIS:  4.9 x 3.3 cm RUL pulmonary mass with concerns for direct mediastinal invasion and right hilar, right paratracheal lymph node enlargement without distant site of metastatic disease.   Squamous cell carcinoma of right lung (HCC)   06/25/2016 Imaging    CT chest- 1. 4.9 x 3.3 cm mass in the medial aspect of the right upper lobe which demonstrates evidence of potential direct mediastinal invasion, and pathologically enlarged right hilar and right paratracheal lymph nodes which suggest nodal metastatic disease. These findings are highly concerning for primary bronchogenic carcinoma and further evaluation with PET-CT is strongly recommended in the near future for further diagnostic and staging purposes. 2. There is also nodularity in the adrenal glands bilaterally, which could reflect additional sites of metastatic disease. Attention at time of forthcoming PET-CT is recommended. 3. Indeterminate lesion in the right lobe of the liver, favored to represent a cavernous hemangioma. Should this lesion prove to be hypermetabolic on PET imaging, further characterization with MRI of the abdomen with and without IV gadolinium could provide definitive characterization. 4. Aortic atherosclerosis, in addition to left main and 2 vessel coronary artery disease. Assessment for potential risk factor modification, dietary therapy or pharmacologic therapy may be warranted, if clinically indicated. 5. Mild diffuse bronchial wall thickening with mild centrilobular and paraseptal emphysema; imaging findings suggestive of underlying COPD. 6. Additional incidental findings, as above.      07/06/2016  Imaging    CT head wo contrast- 1. No acute finding. 2. Advanced chronic microvascular disease.      07/07/2016 PET scan    4.7 cm mass in the medial right upper lobe abutting/invading the mediastinum, suspicious for primary bronchogenic neoplasm.  Possible mild mediastinal/right hilar nodal metastases, equivocal.  No evidence of distant metastases.  3.6 x 2.3 cm subcapsular lesion in the inferior right liver is non FDG avid, likely reflecting a benign hemangioma when correlating with prior CT.      08/27/2016 Procedure    Needle core biopsy of the anterior right upper lobe mass by interventional radiology      08/30/2016 Pathology Results    Squamous cell carcinoma        HISTORY OF PRESENT ILLNESS:   Alexandria Chen is a 82 y.o. female with a medical history significant for history of alcoholism, anxiety/depression, essential hypertension, hyperlipidemia, and vitamin D deficiency, a-fib on Eliquis with plans to transition to warfarin on 08/16/2016 who is referred to the [facility] for right upper lung pulmonary mass measuring 4.9 cm in largest measured dimension.   Patient reports "feel pretty good, but not like it should."  She is unable to tell me exactly what that means.  Patient is accompanied by multiple family members and they all agree that their mother is in denial of recent findings on imaging.  Apparently the patient presented to her primary care provider with some altered mental status issues resulting in a workup including a chest x-ray.  Chest x-ray on 06/24/2016 demonstrated soft tissue mass in the right upper lobe and CT imaging was recommended.  This was followed by CT imaging on 06/25/2016 showing a large  4.9 x 3.3 cm mass in the medial aspect of the right upper lobe demonstrating evidence of potential direct mediastinal invasion pathologically enlarged right hilar and right paratracheal lymph nodes suggestive of nodal metastatic disease.  All findings are  concerning for primary bronchogenic carcinoma.  PET imaging was subsequently recommended.  PET scan was performed on 07/07/2016 demonstrating a hypermetabolic 4.8 cm mass in the medial right upper lobe abutting/invading the mediastinum suspicious for primary bronchogenic carcinoma with possible mild mediastinal/right hilar nodal metastases equivocal.  She was scheduled to see medical oncology immediately following PET scan but unfortunately the patient was in [place] and became dizzy resulting in an admission at [facility].  Therefore, her new patient appointment was deferred until discharge.  At [facility], she was found to be in atrial fibrillation.  She underwent cardioversion and anticoagulation therapy.  She is currently on Eliquis.  Plan is to transition her to Coumadin in April 2018.  Patient and family have been advised that she is to remain on anticoagulation.  Patient returns for continuing follow up. She is accompanied by her 4 daughters and 1 son. I personally reviewed and went over scans with the patient. She reports being currently fatigued and tired, but otherwise has no other complaints.  Pathology biopsy results from 08/27/2016 indicated squamous cell carcinoma of the lung.   Review of Systems  Constitutional: Positive for malaise/fatigue.  HENT: Negative.   Eyes: Negative.   Respiratory: Positive for cough (chronic).   Cardiovascular: Negative.   Gastrointestinal: Negative.   Genitourinary: Negative.   Musculoskeletal: Negative.   Skin: Negative.   Endo/Heme/Allergies: Negative.   Psychiatric/Behavioral: Positive for substance abuse (tobacco abuse). The patient is nervous/anxious.     PAST MEDICAL HISTORY:   Past Medical History:  Diagnosis Date  . Alcohol abuse, in remission   . Anxiety   . Depression   . HTN (hypertension)   . Mass of right lung 07/13/2016  . Osteopenia   . Squamous cell carcinoma of right lung ([place])  07/13/2016    ALLERGIES: No Known Allergies    MEDICATIONS: I have reviewed the patient's current medications.    Current Outpatient Prescriptions on File Prior to Visit  Medication Sig Dispense Refill  . amiodarone (PACERONE) 200 MG tablet Take 200 mg by mouth daily.     Marland Kitchen atorvastatin (LIPITOR) 40 MG tablet TAKE 1 TABLET (40 MG TOTAL) BY MOUTH DAILY. FOR HYPERLIPIDEMIA 90 tablet 1  . busPIRone (BUSPAR) 15 MG tablet Take 1 tablet (15 mg total) by mouth 2 (two) times daily. 180 tablet 2  . Calcium Carb-Cholecalciferol (CALCIUM 500+D3) 500-400 MG-UNIT TABS Take 1 tablet by mouth 2 (two) times daily.    . DULoxetine (CYMBALTA) 60 MG capsule Take 1 capsule (60 mg total) by mouth daily. 30 capsule 2  . fish oil-omega-3 fatty acids 1000 MG capsule Take 3 capsules (3 g total) by mouth daily. For hyperlipidemia. (Patient taking differently: Take 1 g by mouth 3 (three) times daily. For hyperlipidemia.) 30 capsule   . fluticasone (FLONASE) 50 MCG/ACT nasal spray Place 2 sprays into both nostrils 2 (two) times daily. Use as needed (Patient taking differently: Place 2 sprays into both nostrils 2 (two) times daily as needed for allergies. Use as needed) 16 g 6  . gabapentin (NEURONTIN) 100 MG capsule Take 1 capsule (100 mg total) by mouth at bedtime. 90 capsule 2  . lisinopril (PRINIVIL,ZESTRIL) 5 MG tablet Take 5 mg by mouth.    Marland Kitchen  meclizine (ANTIVERT) 25 MG tablet Take 25 mg by mouth 3 (three) times daily as needed for dizziness.    . nitroGLYCERIN (NITROSTAT) 0.4 MG SL tablet Place 0.4 mg under the tongue.     No current facility-administered medications on file prior to visit.      PAST SURGICAL HISTORY History reviewed. No pertinent surgical history.  FAMILY HISTORY: Family History  Problem Relation Age of Onset  . Anxiety disorder Mother   . Dementia Mother   . Hypertension Mother   . Alcohol abuse Father   . Arthritis Father   . Alcohol abuse Brother   . ADD / ADHD Neg Hx   . Drug  abuse Neg Hx   . Bipolar disorder Neg Hx   . Depression Neg Hx   . OCD Neg Hx   . Paranoid behavior Neg Hx   . Schizophrenia Neg Hx   . Seizures Neg Hx   . Sexual abuse Neg Hx   . Physical abuse Neg Hx    Mother deceased at the age of 40 from complications of pneumonia. Father passed away in his late 66s secondary to myocardial infarction. She has 1 sister She has 5 children.  4 daughters and 1 son.  All daughters are present today during discussion. She denies any family history of cancer.  SOCIAL HISTORY:  reports that she quit smoking about 7 weeks ago. Her smoking use included Cigarettes. She started smoking about 60 years ago. She has a 120.00 pack-year smoking history. She has never used smokeless tobacco. She reports that she does not drink alcohol or use drugs.  She quit drinking alcohol in 2014 with a past drinking history of 3 beers per day favoring [employer] and natural light.  She denies any illicit drug abuse.  She is retired and worked at a [employer] as a Training and development officer.  She admits that she is not religious.  She is a widower  5 years.  Her husband passed away secondary to complications of heart disease and diabetes.  Social History   Social History  . Marital status: Widowed    Spouse name: N/A  . Number of children: N/A  . Years of education: N/A   Social History Main Topics  . Smoking status: Former Smoker    Packs/day: 2.00    Years: 60.00    Types: Cigarettes    Start date: 05/03/1956    Quit date: 07/15/2016  . Smokeless tobacco: Never Used  . Alcohol use No     Comment: Patient reports no alcohol use since 05/24/2016.  . Drug use: No  . Sexual activity: No   Other Topics Concern  . None   Social History Narrative  . None    PERFORMANCE STATUS: The patient's performance status is 1 - Symptomatic but completely ambulatory  PHYSICAL EXAM:  Vitals:   09/06/16 1420  BP: (!) 144/88  Pulse: 69  Resp: 18  Temp: 98.4 F (36.9 C)   Filed Weights    09/06/16 1420  Weight: 132 lb (59.9 kg)     Physical Exam  Constitutional: She is oriented to person, place, and time and well-developed, well-nourished, and in no distress.  HENT:  Head: Normocephalic and atraumatic.  Eyes: Conjunctivae and EOM are normal. Pupils are equal, round, and reactive to light.  Neck: Normal range of motion. Neck supple.  Cardiovascular: Normal rate, regular rhythm and normal heart sounds.   Pulmonary/Chest: Effort normal and breath sounds normal.  Abdominal: Soft. Bowel sounds are  normal.  Musculoskeletal: Normal range of motion.  Neurological: She is alert and oriented to person, place, and time. Gait normal.  Skin: Skin is warm and dry.  Nursing note and vitals reviewed.    LABORATORY DATA:  No results found for this or any previous visit (from the past 48 hour(s)).    RADIOGRAPHY: CLINICAL DATA:  Initial treatment strategy for lung cancer.  EXAM: NUCLEAR MEDICINE PET SKULL BASE TO THIGH  TECHNIQUE: 6.3 mCi F-18 FDG was injected intravenously. Full-ring PET imaging was performed from the skull base to thigh after the radiotracer. CT data was obtained and used for attenuation correction and anatomic localization.  FASTING BLOOD GLUCOSE:  Value: 92 mg/dl  COMPARISON:  CT chest dated 06/25/2016  FINDINGS: NECK  No suspicious cervical lymphadenopathy.  CHEST  4.7 cm mass in the medial right upper lobe abutting/invading the mediastinum (series 4/image 50), max SUV 17.5, suspicious for primary bronchogenic neoplasm.  Associated 9 mm lower right paratracheal node (series 4/image 50) with mild hypermetabolism, max SUV 3.4, indeterminate.  Mild hypermetabolism in the right hilar region, max SUV 4.0, poorly evaluated on unenhanced CT.  Underlying mild paraseptal emphysematous changes, upper lobe predominant.  ABDOMEN/PELVIS  No abnormal hypermetabolic activity within the liver, pancreas, adrenal glands, or spleen.  3.6  x 2.3 cm subcapsular hypodense lesion along the lateral aspect of segment 5 (series 4/image 95), non FDG avid, likely reflecting a benign hemangioma when correlating with prior CT.  No hypermetabolic lymph nodes in the abdomen or pelvis.  SKELETON  No focal hypermetabolic activity to suggest skeletal metastasis.  IMPRESSION: 4.7 cm mass in the medial right upper lobe abutting/invading the mediastinum, suspicious for primary bronchogenic neoplasm.  Possible mild mediastinal/right hilar nodal metastases, equivocal.  No evidence of distant metastases.  3.6 x 2.3 cm subcapsular lesion in the inferior right liver is non FDG avid, likely reflecting a benign hemangioma when correlating with prior CT.   Electronically Signed   By: Julian Hy M.D.   On: 07/07/2016 14:13   CLINICAL DATA:  Headache.  History of lung cancer  EXAM: CT HEAD WITHOUT CONTRAST  TECHNIQUE: Contiguous axial images were obtained from the base of the skull through the vertex without intravenous contrast.  COMPARISON:  05/02/2015 brain MRI  FINDINGS: Brain: No evidence of acute infarction, hemorrhage, hydrocephalus, extra-axial collection or mass lesion/mass effect.  Advanced chronic microvascular disease with confluent white matter gliosis in the cerebral white matter and multiple remote lacunar infarcts in the deep gray nuclei and deep white matter tracts. Small remote left cerebellar infarct.  Vascular: Atherosclerosis.  No hyperdense vessel.  Skull: Normal. Negative for fracture or focal lesion.  Sinuses/Orbits: No acute finding.  IMPRESSION: 1. No acute finding. 2. Advanced chronic microvascular disease.   Electronically Signed   By: Monte Fantasia M.D.   On: 07/06/2016 07:22    CLINICAL DATA:  82 year old female with abnormal chest x-ray concerning for lung cancer. Followup study.  EXAM: CT CHEST WITH CONTRAST  TECHNIQUE: Multidetector CT imaging  of the chest was performed during intravenous contrast administration.  CONTRAST:  64m ISOVUE-300 IOPAMIDOL (ISOVUE-300) INJECTION 61%  COMPARISON:  No prior chest CT.  Chest x-ray 06/24/2016.  FINDINGS: Cardiovascular: Heart size is mildly enlarged. There is no significant pericardial fluid, thickening or pericardial calcification. There is aortic atherosclerosis, as well as atherosclerosis of the great vessels of the mediastinum and the coronary arteries, including calcified atherosclerotic plaque in the left main, left anterior descending and left circumflex coronary arteries.  Mediastinum/Nodes: Multiple enlarged right hilar and mediastinal lymph nodes. The largest of these include a 1.3 cm right hilar lymph node (image 59 of series 2) and a 1.4 cm low right paratracheal lymph node (image 54 of series 2). No contralateral mediastinal or left hilar lymphadenopathy. Esophagus is unremarkable in appearance. No axillary lymphadenopathy.  Lungs/Pleura: Large mass in the medial aspect of the right upper lobe measuring 4.9 x 3.3 cm (axial image 53 of series 2). This makes a broad contact with the pleura adjacent to the mediastinum, obscuring the intervening fat plane and slightly distorting the adjacent superior vena cava, which could indicate very early invasion (not yet definitive). In the surrounding lung parenchyma distal to the mass there are areas of septal thickening, which may reflect postobstructive changes or surrounding lymphangitic spread of disease. 4 mm pleural-based nodule associated with the minor fissure is nonspecific, but favored to represent a subpleural lymph node. No acute consolidative airspace disease. No pleural effusions. Patchy areas of peripheral predominant ground-glass attenuation are also noted throughout the lung bases bilaterally. Mild diffuse bronchial wall thickening with mild centrilobular and paraseptal emphysema.  Upper Abdomen:  Incompletely visualized lesion in the right lobe of the liver which demonstrates some peripheral nodular enhancement, favored to represent a cavernous hemangioma (but not characterized) measuring at least 3.8 x 2.3 cm (image 146 of series 2). Incompletely visualized low-attenuation lesion measuring at least 2.1 x 3.3 cm in the anterior aspect of the interpolar region of the left kidney is not characterized, but likely a large cyst. Several other tiny subcentimeter low-attenuation lesions also noted in the kidneys bilaterally, also incompletely characterized due to their small size, but favored to represent tiny cysts. Multiple nodules associated with the adrenal glands bilaterally, incompletely characterized on today's contrast enhanced examination, largest of which measures up to 1.4 cm on the left. Aortic atherosclerosis.  Musculoskeletal: There are no aggressive appearing lytic or blastic lesions noted in the visualized portions of the skeleton.  IMPRESSION: 1. 4.9 x 3.3 cm mass in the medial aspect of the right upper lobe which demonstrates evidence of potential direct mediastinal invasion, and pathologically enlarged right hilar and right paratracheal lymph nodes which suggest nodal metastatic disease. These findings are highly concerning for primary bronchogenic carcinoma and further evaluation with PET-CT is strongly recommended in the near future for further diagnostic and staging purposes. 2. There is also nodularity in the adrenal glands bilaterally, which could reflect additional sites of metastatic disease. Attention at time of forthcoming PET-CT is recommended. 3. Indeterminate lesion in the right lobe of the liver, favored to represent a cavernous hemangioma. Should this lesion prove to be hypermetabolic on PET imaging, further characterization with MRI of the abdomen with and without IV gadolinium could provide definitive characterization. 4. Aortic atherosclerosis,  in addition to left main and 2 vessel coronary artery disease. Assessment for potential risk factor modification, dietary therapy or pharmacologic therapy may be warranted, if clinically indicated. 5. Mild diffuse bronchial wall thickening with mild centrilobular and paraseptal emphysema; imaging findings suggestive of underlying COPD. 6. Additional incidental findings, as above.   Electronically Signed   By: Vinnie Langton M.D.   On: 06/25/2016 15:43   CT HEAD WITHOUT CONTRAST 07/06/2016  IMPRESSION: 1. No acute finding. 2. Advanced chronic microvascular disease.  NUCLEAR MEDICINE PET SKULL BASE TO THIGH 07/07/2016  IMPRESSION: 4.7 cm mass in the medial right upper lobe abutting/invading the mediastinum, suspicious for primary bronchogenic neoplasm.  Possible mild mediastinal/right hilar nodal metastases, equivocal.  No  evidence of distant metastases.  3.6 x 2.3 cm subcapsular lesion in the inferior right liver is non FDG avid, likely reflecting a benign hemangioma when correlating with prior CT.    PORTABLE CHEST 1 VIEW 08/27/2016   IMPRESSION: RIGHT upper lobe mass and suspected superior RIGHT hilar adenopathy without evidence of pneumothorax post biopsy.   PATHOLOGY:    ASSESSMENT/PLAN:  Stage IIIB (T4N2M0) squamous cell carcinoma of the right lung.  PLAN: Pathology biopsy results from 08/27/2016 indicated squamous cell carcinoma of the lung. I have reviewed path results in detail with the patient and her children.  I have discussed treatment plan with concurrent chemoRT to be followed by consolidative treatment with infimzi with the patient and her family.   I talked to the patient in detail about getting a chemo port placed by [facility], stat order placed today.  She will receive weekly carboplatin + taxol in addition to radiation. I have educated the patient and her family in detail of the side effects of chemotherapy including but not limited  to fatigue, altered taste, decreased appetite, decreased blood counts, neuropathy, and N/V, diarrhea.   I will tell our nurse navigator, Anderson Malta, to provide the patient with chemotherapy education.   The patient's family said they will discuss and let me know tomorrow at what location they would like to get her radiation treatment, then I will place a stat referral.  Tentatively plan to start her chemotherapy in 2 weeks from now on 09/20/16 however will make adjustments according to the start date of her radiation. I reccommended that she take her anti-nausea medication around the clock for the 2-3 days after treatment. I educated her on eating properly when she is on treatment and to stay within 5 pounds due to her treatment being weight based.   RTC in 3 weeks for follow up after patient's first cycle to assess for toxicities.    All questions were answered. The patient knows to call the clinic with any problems, questions or concerns, a total of 45 min was spent at bedside with the patient and her family.   This document serves as a record of services personally performed by Twana First, MD. It was created on her behalf by Shirlean Mylar, a trained medical scribe. The creation of this record is based on the scribe's personal observations and the provider's statements to them. This document has been checked and approved by the attending provider.  I have reviewed the above documentation for accuracy and completeness and I agree with the above.  This note is electronically signed by: Mikey College 09/06/2016 3:00 PM

## 2016-09-06 NOTE — Progress Notes (Signed)
START ON PATHWAY REGIMEN - Non-Small Cell Lung     Administer weekly:     Paclitaxel      Carboplatin   **Always confirm dose/schedule in your pharmacy ordering system**  Patient Characteristics: Stage III - Unresectable, PS = 0, 1 AJCC T Category: T4 Current Disease Status: No Distant Mets or Local Recurrence AJCC N Category: N2 AJCC M Category: M0 AJCC 8 Stage Grouping: IIIB Performance Status: PS = 0, 1 Intent of Therapy: Curative Intent, Discussed with Patient 

## 2016-09-06 NOTE — Patient Instructions (Addendum)
[place] at [facility] Discharge Instructions  RECOMMENDATIONS MADE BY THE CONSULTANT AND ANY TEST RESULTS WILL BE SENT TO YOUR REFERRING PHYSICIAN.  You were seen today by Dr. Twana First We will refer you to have a port a cath placed We will also work on your referral to radiation oncology Follow up in 3 weeks    Thank you for choosing [place] at [facility] to provide your oncology and hematology care.  To afford each patient quality time with our provider, please arrive at least 15 minutes before your scheduled appointment time.    If you have a lab appointment with the [facility] please come in thru the  Main Entrance and check in at the main information desk  You need to re-schedule your appointment should you arrive 10 or more minutes late.  We strive to give you quality time with our providers, and arriving late affects you and other patients whose appointments are after yours.  Also, if you no show three or more times for appointments you may be dismissed from the clinic at the providers discretion.     Again, thank you for choosing [facility].  Our hope is that these requests will decrease the amount of time that you wait before being seen by our physicians.       _____________________________________________________________  Should you have questions after your visit to [facility], please contact our office at [phone] between the hours of 8:30 a.m. and 4:30 p.m.  Voicemails left after 4:30 p.m. will not be returned until the following business day.  For prescription refill requests, have your pharmacy contact our office.       Resources For Cancer Patients and their Caregivers ? American Cancer Society: Can assist with transportation, wigs, general needs, runs Look Good Feel Better.        364-317-5528 ? Cancer Care: Provides financial assistance, online support groups,  medication/co-pay assistance.  1-800-813-HOPE 908 001 6838) ? [facility] Assists [place] cancer patients and their families through emotional , educational and financial support.  720-032-0388 ? [place] DSS Where to apply for food stamps, Medicaid and utility assistance. (201) 273-3613 ? [facility]: Transportation to medical appointments. 7570170460 ? Social Security Administration: May apply for disability if have a Stage IV cancer. 534 017 1527 (867) 502-3535 ? [employer], Disability and Transit Services: Assists with nutrition, care and transit needs. [place] Support Programs: '@10RELATIVEDAYS'$ @ > Cancer Support Group  2nd Tuesday of the month 1pm-2pm, Journey Room  > Creative Journey  3rd Tuesday of the month 1130am-1pm, Journey Room  > Look Good Feel Better  1st Wednesday of the month 10am-12 noon, Journey Room (Call [place] to register [phone])

## 2016-09-06 NOTE — Progress Notes (Signed)
Met with pt face to face.  Introduced myself and explain a little bit about my role as the patient navigator.  Pt given a card with all my information on it.  Told pt to call if they had any questions or concerns.  Pt verbalized understanding.   Whole family provided with there own packet for teaching.

## 2016-09-07 ENCOUNTER — Telehealth (HOSPITAL_COMMUNITY): Payer: Self-pay | Admitting: *Deleted

## 2016-09-07 ENCOUNTER — Encounter (HOSPITAL_COMMUNITY): Payer: Self-pay | Admitting: Lab

## 2016-09-07 NOTE — Progress Notes (Unsigned)
Referral sent to [facility].  Records faxed on 5/8

## 2016-09-07 NOTE — Telephone Encounter (Signed)
CANCELLED BY KIM DUE TO OTHER APPOINTMENTS.  SHE WILL WAIT BEFORE RESCHEDULING.

## 2016-09-09 ENCOUNTER — Ambulatory Visit (INDEPENDENT_AMBULATORY_CARE_PROVIDER_SITE_OTHER): Payer: Medicare Other | Admitting: Pharmacist

## 2016-09-09 DIAGNOSIS — I4891 Unspecified atrial fibrillation: Secondary | ICD-10-CM | POA: Diagnosis not present

## 2016-09-09 DIAGNOSIS — R300 Dysuria: Secondary | ICD-10-CM | POA: Diagnosis not present

## 2016-09-09 LAB — COAGUCHEK XS/INR WAIVED
INR: 1.2 — AB (ref 0.9–1.1)
Prothrombin Time: 14.5 s

## 2016-09-09 NOTE — Patient Instructions (Signed)
Bleeding Precautions When on Anticoagulant Therapy WHAT IS ANTICOAGULANT THERAPY? Anticoagulant therapy is taking medicine to prevent or reduce blood clots. It is also called blood thinner therapy. Blood clots that form in your blood vessels can be dangerous. They can break loose and travel to your heart, lungs, or brain. This increases your risk of a heart attack or stroke. Anticoagulant therapy causes blood to clot more slowly. You may need anticoagulant therapy if you have:  A medical condition that increases the likelihood that blood clots will form.  A heart defect or a problem with heart rhythm. It is also a common treatment after heart surgery, such as valve replacement. WHAT ARE COMMON TYPES OF ANTICOAGULANT THERAPY? Anticoagulant medicine can be injected or taken by mouth.If you need anticoagulant therapy quickly at the hospital, the medicine may be injected under your skin or given through an IV tube. Heparin is a common example of an anticoagulant that you may get at the hospital. Most anticoagulant therapy is in the form of pills that you take at home every day. These may include:  Aspirin. This common blood thinner works by preventing blood cells (platelets) from sticking together to form a clot. Aspirin is not as strong as anticoagulants that slow down the time that it takes for your body to form a clot.  Clopidogrel. This is a newer type of drug that affects platelets. It is stronger than aspirin.  Warfarin. This is the most common anticoagulant. It changes the way your body uses vitamin K, a vitamin that helps your blood to clot. The risk of bleeding is higher with warfarin than with aspirin. You will need frequent blood tests to make sure you are taking the safest amount.  New anticoagulants. Several new drugs have been approved. They are all taken by mouth. Studies show that these drugs work as well as warfarin. They do not require blood testing. They may cause less bleeding  risk than warfarin. WHAT DO I NEED TO REMEMBER WHEN TAKING ANTICOAGULANT THERAPY? Anticoagulant therapy decreases your risk of forming a blood clot, but it increases your risk of bleeding. Work closely with your health care provider to make sure you are taking your medicine safely. These tips can help:  Learn ways to reduce your risk of bleeding.  If you are taking warfarin:  Have blood tests as ordered by your health care provider.  Do not make any sudden changes to your diet. Vitamin K in your diet can make warfarin less effective.  Do not get pregnant. This medicine may cause birth defects.  Take your medicine at the same time every day. If you forget to take your medicine, take it as soon as you remember. If you miss a whole day, do not double your dose of medicine. Take your normal dose and call your health care provider to check in.  Do not stop taking your medicine on your own.  Tell your health care provider before you start taking any new medicine, vitamin, or herbal product. Some of these could interfere with your therapy.  Tell all of your health care providers that you are on anticoagulant therapy.  Do not have surgery, medical procedures, or dental work until you tell your health care provider that you are on anticoagulant therapy. WHAT CAN AFFECT HOW ANTICOAGULANTS WORK? Certain foods, vitamins, medicines, supplements, and herbal medicines change the way that anticoagulant therapy works. They may increase or decrease the effects of your anticoagulant therapy. Either result can be dangerous for you.  Many over-the-counter medicines for pain, colds, or stomach problems interfere with anticoagulant therapy. Take these only as told by your health care provider.  Do not drink alcohol. It can interfere with your medicine and increase your risk of an injury that causes bleeding.  If you are taking warfarin, do not begin eating more foods that contain vitamin K. These include  leafy green vegetables. Ask your health care provider if you should avoid any foods. WHAT ARE SOME WAYS TO PREVENT BLEEDING? You can prevent bleeding by taking certain precautions:  Be extra careful when you use knives, scissors, or other sharp objects.  Use an electric razor instead of a blade.  Do not use toothpicks.  Use a soft toothbrush.  Wear shoes that have nonskid soles.  Use bath mats and handrails in your bathroom.  Wear gloves while you do yard work.  Wear a helmet when you ride a bike.  Wear your seat belt.  Prevent falls by removing loose rugs and extension cords from areas where you walk.  Do not play contact sports or participate in other activities that have a high risk of injury. [place] PROVIDER? Call your health care provider if:  You miss a dose of medicine:  And you are not sure what to do.  For more than one day.  You have:  Menstrual bleeding that is heavier than normal.  Blood in your urine.  A bloody nose or bleeding gums.  Easy bruising.  Blood in your stool (feces) or have black and tarry stool.  Side effects from your medicine.  You feel weak or dizzy.  You become pregnant. Seek immediate medical care if:  You have bleeding that will not stop.  You have sudden and severe headache or belly pain.  You vomit or you cough up bright red blood.  You have a severe blow to your head. WHAT ARE SOME QUESTIONS TO ASK MY HEALTH CARE PROVIDER?  What is the best anticoagulant therapy for my condition?  What side effects should I watch for?  When should I take my medicine? What should I do if I forget to take it?  Will I need to have regular blood tests?  Do I need to change my diet? Are there foods or drinks that I should avoid?  What activities are safe for me?  What should I do if I want to get pregnant? This information is not intended to replace advice given to you by your health care provider.  Make sure you discuss any questions you have with your health care provider. Document Released: 03/31/2015 Document Reviewed: 03/31/2015 Elsevier Interactive Patient Education  2017 [place] and Warfarin Warfarin is a blood thinner (anticoagulant). Anticoagulant medicines help prevent the formation of blood clots. These medicines work by decreasing the activity of vitamin K, which promotes normal blood clotting. When you take warfarin, problems can occur from suddenly increasing or decreasing the amount of vitamin K that you eat from one day to the next. Problems may include:  Blood clots.  Bleeding. What general guidelines do I need to follow? To avoid problems when taking warfarin:  Eat a balanced diet that includes:  Fresh fruits and vegetables.  Whole grains.  Low-fat dairy products.  Lean proteins, such as fish, eggs, and lean cuts of meat.  Keep your intake of vitamin K consistent from day to day. To do this:  Avoid eating large amounts of vitamin K  one day and low amounts of vitamin K the next day.  If you take a multivitamin that contains vitamin K, be sure to take it every day.  Know which foods contain vitamin K. Use the lists below to understand serving sizes and the amount of vitamin K in one serving.  Avoid major changes in your diet. If you are going to change your diet, talk with your health care provider before making changes.  Work with a Financial planner (dietitian) to develop a meal plan that works best for you. High vitamin K foods Foods that are high in vitamin K contain more than 100 mcg (micrograms) per serving. These include:  Broccoli (cooked) -  cup has 110 mcg.  Brussels sprouts (cooked) -  cup has 109 mcg.  Greens, beet (cooked) -  cup has 350 mcg.  Greens, collard (cooked) -  cup has 418 mcg.  Greens, turnip (cooked) -  cup has 265 mcg.  Green onions or scallions -  cup has 105 mcg.  Kale (fresh or  frozen) -  cup has 531 mcg.  Parsley (raw) - 10 sprigs has 164 mcg.  Spinach (cooked) -  cup has 444 mcg.  Swiss chard (cooked) -  cup has 287 mcg. Moderate vitamin K foods Foods that have a moderate amount of vitamin K contain 25-100 mcg per serving. These include:  Asparagus (cooked) - 5 spears have 38 mcg.  Black-eyed peas (dried) -  cup has 32 mcg.  Cabbage (cooked) -  cup has 37 mcg.  Kiwi fruit - 1 medium has 31 mcg.  Lettuce - 1 cup has 57-63 mcg.  Okra (frozen) -  cup has 44 mcg.  Prunes (dried) - 5 prunes have 25 mcg.  Watercress (raw) - 1 cup has 85 mcg. Low vitamin K foods Foods low in vitamin K contain less than 25 mcg per serving. These include:  Artichoke - 1 medium has 18 mcg.  Avocado - 1 oz. has 6 mcg.  Blueberries -  cup has 14 mcg.  Cabbage (raw) -  cup has 21 mcg.  Carrots (cooked) -  cup has 11 mcg.  Cauliflower (raw) -  cup has 11 mcg.  Cucumber with peel (raw) -  cup has 9 mcg.  Grapes -  cup has 12 mcg.  Mango - 1 medium has 9 mcg.  Nuts - 1 oz. has 15 mcg.  Pear - 1 medium has 8 mcg.  Peas (cooked) -  cup has 19 mcg.  Pickles - 1 spear has 14 mcg.  Pumpkin seeds - 1 oz. has 13 mcg.  Sauerkraut (canned) -  cup has 16 mcg.  Soybeans (cooked) -  cup has 16 mcg.  Tomato (raw) - 1 medium has 10 mcg.  Tomato sauce -  cup has 17 mcg. Vitamin K-free foods If a food contain less than 5 mcg per serving, it is considered to have no vitamin K. These foods include:  Bread and cereal products.  Cheese.  Eggs.  Fish and shellfish.  Meat and poultry.  Milk and dairy products.  Sunflower seeds. Actual amounts of vitamin K in foods may be different depending on processing. Talk with your dietitian about what foods you can eat and what foods you should avoid. This information is not intended to replace advice given to you by your health care provider. Make sure you discuss any questions you have with your health  care provider. Document Released: 02/14/2009 Document Revised: 11/09/2015 Document Reviewed: 07/23/2015 Elsevier Interactive Patient  Education  2017 Elsevier Inc.  

## 2016-09-10 ENCOUNTER — Other Ambulatory Visit: Payer: Medicare Other

## 2016-09-10 DIAGNOSIS — R300 Dysuria: Secondary | ICD-10-CM | POA: Diagnosis not present

## 2016-09-11 LAB — URINE CULTURE

## 2016-09-11 LAB — URINALYSIS, COMPLETE
BILIRUBIN UA: NEGATIVE
GLUCOSE, UA: NEGATIVE
KETONES UA: NEGATIVE
Leukocytes, UA: NEGATIVE
Nitrite, UA: NEGATIVE
PROTEIN UA: NEGATIVE
RBC, UA: NEGATIVE
Specific Gravity, UA: 1.014 (ref 1.005–1.030)
Urobilinogen, Ur: 0.2 mg/dL (ref 0.2–1.0)
pH, UA: 6.5 (ref 5.0–7.5)

## 2016-09-11 LAB — MICROSCOPIC EXAMINATION: CASTS: NONE SEEN /LPF

## 2016-09-14 ENCOUNTER — Other Ambulatory Visit (HOSPITAL_COMMUNITY): Payer: Self-pay | Admitting: Emergency Medicine

## 2016-09-14 ENCOUNTER — Telehealth: Payer: Self-pay | Admitting: Family

## 2016-09-14 NOTE — Telephone Encounter (Signed)
Called Mrs. Jolin' daughter to clarified warfarin directions for patient following port placement.

## 2016-09-14 NOTE — Progress Notes (Signed)
pts daughter [name] called to ask about the medications I had called in.  [name] seems very overwhelmed and tearful. She said that she had picked up all of the medications except the emla cream because she said it was insurance issue. I contacted [facility] and they have been faxing the prior authorization to the wrong number once again.  I told [name] that she could call me with any questions, hopefully get her mom to start chemo on June 4th.

## 2016-09-15 ENCOUNTER — Other Ambulatory Visit: Payer: Self-pay | Admitting: Radiology

## 2016-09-15 ENCOUNTER — Ambulatory Visit (HOSPITAL_COMMUNITY): Payer: Self-pay | Admitting: Psychiatry

## 2016-09-16 NOTE — Patient Instructions (Signed)
Arrive at [employer] in Radiology @ [facility], npo after mdnt, have driver.  Ok to take bp meds with sip of water.

## 2016-09-17 ENCOUNTER — Ambulatory Visit (HOSPITAL_COMMUNITY)
Admission: RE | Admit: 2016-09-17 | Discharge: 2016-09-17 | Disposition: A | Discharge status: Home / Self Care | Payer: Medicare Other | Source: Ambulatory Visit | Attending: Oncology | Admitting: Oncology

## 2016-09-17 ENCOUNTER — Encounter (HOSPITAL_COMMUNITY): Payer: Self-pay

## 2016-09-17 ENCOUNTER — Other Ambulatory Visit (HOSPITAL_COMMUNITY): Payer: Self-pay | Admitting: Oncology

## 2016-09-17 DIAGNOSIS — C3491 Malignant neoplasm of unspecified part of right bronchus or lung: Secondary | ICD-10-CM

## 2016-09-17 DIAGNOSIS — C3411 Malignant neoplasm of upper lobe, right bronchus or lung: Secondary | ICD-10-CM | POA: Diagnosis not present

## 2016-09-17 DIAGNOSIS — M8580 Other specified disorders of bone density and structure, unspecified site: Secondary | ICD-10-CM | POA: Diagnosis not present

## 2016-09-17 DIAGNOSIS — I10 Essential (primary) hypertension: Secondary | ICD-10-CM | POA: Insufficient documentation

## 2016-09-17 DIAGNOSIS — Z7951 Long term (current) use of inhaled steroids: Secondary | ICD-10-CM | POA: Diagnosis not present

## 2016-09-17 DIAGNOSIS — F419 Anxiety disorder, unspecified: Secondary | ICD-10-CM | POA: Diagnosis not present

## 2016-09-17 DIAGNOSIS — F1011 Alcohol abuse, in remission: Secondary | ICD-10-CM | POA: Insufficient documentation

## 2016-09-17 DIAGNOSIS — Z7901 Long term (current) use of anticoagulants: Secondary | ICD-10-CM | POA: Insufficient documentation

## 2016-09-17 DIAGNOSIS — Z452 Encounter for adjustment and management of vascular access device: Secondary | ICD-10-CM | POA: Diagnosis not present

## 2016-09-17 DIAGNOSIS — F329 Major depressive disorder, single episode, unspecified: Secondary | ICD-10-CM | POA: Diagnosis not present

## 2016-09-17 HISTORY — DX: Dyspnea, unspecified: R06.00

## 2016-09-17 HISTORY — PX: IR US GUIDE VASC ACCESS RIGHT: IMG2390

## 2016-09-17 HISTORY — DX: Unspecified atrial fibrillation: I48.91

## 2016-09-17 HISTORY — DX: Cardiac arrhythmia, unspecified: I49.9

## 2016-09-17 HISTORY — DX: Personal history of other diseases of the respiratory system: Z87.09

## 2016-09-17 HISTORY — PX: IR FLUORO GUIDE PORT INSERTION RIGHT: IMG5741

## 2016-09-17 LAB — CBC WITH DIFFERENTIAL/PLATELET
Basophils Absolute: 0 10*3/uL (ref 0.0–0.1)
Basophils Relative: 0 %
EOS ABS: 0.1 10*3/uL (ref 0.0–0.7)
Eosinophils Relative: 1 %
HCT: 37.6 % (ref 36.0–46.0)
Hemoglobin: 12.4 g/dL (ref 12.0–15.0)
LYMPHS ABS: 1.8 10*3/uL (ref 0.7–4.0)
Lymphocytes Relative: 32 %
MCH: 29.5 pg (ref 26.0–34.0)
MCHC: 33 g/dL (ref 30.0–36.0)
MCV: 89.5 fL (ref 78.0–100.0)
Monocytes Absolute: 0.6 10*3/uL (ref 0.1–1.0)
Monocytes Relative: 11 %
Neutro Abs: 3.2 10*3/uL (ref 1.7–7.7)
Neutrophils Relative %: 56 %
PLATELETS: 305 10*3/uL (ref 150–400)
RBC: 4.2 MIL/uL (ref 3.87–5.11)
RDW: 14 % (ref 11.5–15.5)
WBC: 5.6 10*3/uL (ref 4.0–10.5)

## 2016-09-17 LAB — PROTIME-INR
INR: 1.24
Prothrombin Time: 15.7 seconds — ABNORMAL HIGH (ref 11.4–15.2)

## 2016-09-17 MED ORDER — SODIUM CHLORIDE 0.9 % IV SOLN
INTRAVENOUS | Status: DC
  Administered 2016-09-17: 13:00:00 via INTRAVENOUS

## 2016-09-17 MED ORDER — MIDAZOLAM HCL 2 MG/2ML IJ SOLN
INTRAMUSCULAR | Status: AC
  Filled 2016-09-17: qty 4

## 2016-09-17 MED ORDER — NALOXONE HCL 0.4 MG/ML IJ SOLN
INTRAMUSCULAR | Status: AC
  Filled 2016-09-17: qty 1

## 2016-09-17 MED ORDER — FENTANYL CITRATE (PF) 100 MCG/2ML IJ SOLN
INTRAMUSCULAR | Status: AC
  Filled 2016-09-17: qty 4

## 2016-09-17 MED ORDER — CEFAZOLIN SODIUM-DEXTROSE 2-4 GM/100ML-% IV SOLN: 2.0000 g | INTRAVENOUS | Status: DC

## 2016-09-17 MED ORDER — LIDOCAINE HCL 1 % IJ SOLN
INTRAMUSCULAR | Status: AC | PRN
  Administered 2016-09-17: 10 mL via INTRADERMAL

## 2016-09-17 MED ORDER — FLUMAZENIL 0.5 MG/5ML IV SOLN
INTRAVENOUS | Status: AC
  Filled 2016-09-17: qty 5

## 2016-09-17 MED ORDER — CEFAZOLIN SODIUM-DEXTROSE 2-4 GM/100ML-% IV SOLN
INTRAVENOUS | Status: AC
Start: 2016-09-17 — End: 2016-09-17
  Administered 2016-09-17: 2000 mg
  Filled 2016-09-17: qty 100

## 2016-09-17 MED ORDER — LIDOCAINE-EPINEPHRINE (PF) 2 %-1:200000 IJ SOLN
INTRAMUSCULAR | Status: AC
  Filled 2016-09-17: qty 20

## 2016-09-17 MED ORDER — MIDAZOLAM HCL 2 MG/2ML IJ SOLN
INTRAMUSCULAR | Status: AC | PRN
  Administered 2016-09-17: 1 mg via INTRAVENOUS
  Administered 2016-09-17 (×2): 0.5 mg via INTRAVENOUS

## 2016-09-17 MED ORDER — HEPARIN SOD (PORK) LOCK FLUSH 100 UNIT/ML IV SOLN
INTRAVENOUS | Status: DC
Start: 2016-09-17 — End: 2016-09-18
  Filled 2016-09-17: qty 5

## 2016-09-17 MED ORDER — HEPARIN SOD (PORK) LOCK FLUSH 100 UNIT/ML IV SOLN
INTRAVENOUS | Status: AC | PRN
  Administered 2016-09-17: 500 [IU] via INTRAVENOUS

## 2016-09-17 MED ORDER — FENTANYL CITRATE (PF) 100 MCG/2ML IJ SOLN
INTRAMUSCULAR | Status: AC | PRN
  Administered 2016-09-17: 50 ug via INTRAVENOUS
  Administered 2016-09-17 (×2): 25 ug via INTRAVENOUS

## 2016-09-17 MED ORDER — LIDOCAINE HCL 1 % IJ SOLN
INTRAMUSCULAR | Status: AC
  Filled 2016-09-17: qty 20

## 2016-09-17 NOTE — Discharge Instructions (Signed)
Implanted Port Insertion, Care After This sheet gives you information about how to care for yourself after your procedure. Your health care provider may also give you more specific instructions. If you have problems or questions, contact your health care provider. What can I expect after the procedure? After your procedure, it is common to have:  Discomfort at the port insertion site.  Bruising on the skin over the port. This should improve over 3-4 days. Follow these instructions at home: [facility] care   After your port is placed, you will get a manufacturer's information card. The card has information about your port. Keep this card with you at all times.  Take care of the port as told by your health care provider. Ask your health care provider if you or a family member can get training for taking care of the port at home. A home health care nurse may also take care of the port.  Make sure to remember what type of port you have. Incision care   Follow instructions from your health care provider about how to take care of your port insertion site. Make sure you:  Wash your hands with soap and water before you change your bandage (dressing). If soap and water are not available, use hand sanitizer.  Change your dressing as told by your health care provider. 24 to 48 hours post procedure, may remove dressing and shower.  Keep site clean and dry.  Leave stitches (sutures), skin glue, or adhesive strips in place. These skin closures may need to stay in place for 2 weeks or longer. If adhesive strip edges start to loosen and curl up, you may trim the loose edges. Do not remove adhesive strips completely unless your health care provider tells you to do that.  Check your port insertion site every day for signs of infection. Check for:  More redness, swelling, or pain.  More fluid or blood.  Warmth.  Pus or a bad smell. General instructions   Do not take baths, swim, or use a hot tub until  your health care provider approves.  Do not lift anything that is heavier than 10 lb (4.5 kg) for a week, or as told by your health care provider.  Ask your health care provider when it is okay to:  Return to work or school.  Resume usual physical activities or sports.  Do not drive for 24 hours if you were given a medicine to help you relax (sedative).  Take over-the-counter and prescription medicines only as told by your health care provider.  Wear a medical alert bracelet in case of an emergency. This will tell any health care providers that you have a port.  Keep all follow-up visits as told by your health care provider. This is important. Contact a health care provider if:  You cannot flush your port with saline as directed, or you cannot draw blood from the port.  You have a fever or chills.  You have more redness, swelling, or pain around your port insertion site.  You have more fluid or blood coming from your port insertion site.  Your port insertion site feels warm to the touch.  You have pus or a bad smell coming from the port insertion site. Get help right away if:  You have chest pain or shortness of breath.  You have bleeding from your port that you cannot control. Summary  Take care of the port as told by your health care provider.  Change your dressing  as told by your health care provider.  Keep all follow-up visits as told by your health care provider. This information is not intended to replace advice given to you by your health care provider. Make sure you discuss any questions you have with your health care provider. Document Released: 02/07/2013 Document Revised: 03/10/2016 Document Reviewed: 03/10/2016 Elsevier Interactive Patient Education  2017 [place].  Moderate Conscious Sedation, Adult, Care After These instructions provide you with information about caring for yourself after your procedure. Your health care provider may also give you  more specific instructions. Your treatment has been planned according to current medical practices, but problems sometimes occur. Call your health care provider if you have any problems or questions after your procedure. What can I expect after the procedure? After your procedure, it is common:  To feel sleepy for several hours.  To feel clumsy and have poor balance for several hours.  To have poor judgment for several hours.  To vomit if you eat too soon. Follow these instructions at home: For at least 24 hours after the procedure:    Do not:  Participate in activities where you could fall or become injured.  Drive.  Use heavy machinery.  Drink alcohol.  Take sleeping pills or medicines that cause drowsiness.  Make important decisions or sign legal documents.  Take care of children on your own.  Rest. Eating and drinking   Follow the diet recommended by your health care provider.  If you vomit:  Drink water, juice, or soup when you can drink without vomiting.  Make sure you have little or no nausea before eating solid foods. General instructions   Have a responsible adult stay with you until you are awake and alert.  Take over-the-counter and prescription medicines only as told by your health care provider.  If you smoke, do not smoke without supervision.  Keep all follow-up visits as told by your health care provider. This is important. Contact a health care provider if:  You keep feeling nauseous or you keep vomiting.  You feel light-headed.  You develop a rash.  You have a fever. Get help right away if:  You have trouble breathing. This information is not intended to replace advice given to you by your health care provider. Make sure you discuss any questions you have with your health care provider. Document Released: 02/07/2013 Document Revised: 09/22/2015 Document Reviewed: 08/09/2015 Elsevier Interactive Patient Education  2017 [employer].

## 2016-09-17 NOTE — H&P (Signed)
Referring Physician(s): Zhou,Louise  Supervising Physician: Jacqulynn Cadet  Patient Status:  [facility] OP  Chief Complaint:  "I'm here for a port a cath"  Subjective: Patient familiar to IR service from prior right lung mass biopsy in April of this year. 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 She currently denies fever, significant chest pain, abdominal pain, nausea, vomiting or abnormal bleeding. She does have fatigue,  occasional headaches, some dyspnea with exertion as well as cough and back pain. Past Medical History:  Diagnosis Date  . Alcohol abuse, in remission   . Anxiety   . Depression   . HTN (hypertension)   . Mass of right lung 07/13/2016  . Osteopenia   . Squamous cell carcinoma of right lung ([place]) 07/13/2016  No past surgical history on file.    Allergies: Patient has no known allergies.  Medications: Prior to Admission medications   Medication Sig Start Date End Date Taking? Authorizing Provider  amiodarone (PACERONE) 200 MG tablet Take 200 mg by mouth daily.  08/04/16   [provider]  atorvastatin (LIPITOR) 40 MG tablet TAKE 1 TABLET (40 MG TOTAL) BY MOUTH DAILY. FOR HYPERLIPIDEMIA 05/29/16   Hawks, Alyse Low A, FNP  busPIRone (BUSPAR) 15 MG tablet Take 1 tablet (15 mg total) by mouth 2 (two) times daily. 08/02/16   Cloria Spring, MD  Calcium Carb-Cholecalciferol (CALCIUM 500+D3) 500-400 MG-UNIT TABS Take 1 tablet by mouth 2 (two) times daily.    [provider]  CARBOPLATIN IV Inject into the vein. weekly    [provider]  dexamethasone (DECADRON) 4 MG tablet Take 2 tablets (8 mg total) by mouth daily. Start the day after chemotherapy for 2 days. 09/06/16   Twana First, MD  DULoxetine (CYMBALTA) 60 MG capsule Take 1 capsule (60 mg total) by mouth daily. 08/02/16 08/02/17  Cloria Spring, MD  fish oil-omega-3 fatty acids 1000 MG capsule  Take 3 capsules (3 g total) by mouth daily. For hyperlipidemia. Patient taking differently: Take 1 g by mouth 3 (three) times daily. For hyperlipidemia. 10/19/11   Ruben Im, PA-C  fluticasone (FLONASE) 50 MCG/ACT nasal spray Place 2 sprays into both nostrils 2 (two) times daily. Use as needed Patient taking differently: Place 2 sprays into both nostrils 2 (two) times daily as needed for allergies. Use as needed 11/05/15   Evelina Dun A, FNP  gabapentin (NEURONTIN) 100 MG capsule Take 1 capsule (100 mg total) by mouth at bedtime. 08/02/16   Cloria Spring, MD  lidocaine-prilocaine (EMLA) cream Apply to affected area once 09/06/16   Twana First, MD  lisinopril (PRINIVIL,ZESTRIL) 5 MG tablet Take 5 mg by mouth. 08/25/16   [provider]  LORazepam (ATIVAN) 0.5 MG tablet Take 1 tablet (0.5 mg total) by mouth every 6 (six) hours as needed (Nausea or vomiting). 09/06/16   Twana First, MD  meclizine (ANTIVERT) 25 MG tablet Take 25 mg by mouth 3 (three) times daily as needed for dizziness.    [provider]  nitroGLYCERIN (NITROSTAT) 0.4 MG SL tablet Place 0.4 mg under the tongue. 07/13/16   [provider]  PACLitaxel (TAXOL IV) Inject into the vein. Weekly    [provider]  prochlorperazine (COMPAZINE) 10 MG tablet Take 1 tablet (10 mg total) by mouth every 6 (six) hours as needed (Nausea or vomiting). 09/06/16   Twana First, MD  warfarin (COUMADIN) 2.5  MG tablet Take 2.5 mg by mouth daily.    [provider]     Vital Signs: BP (!) 160/61 (BP Location: Right Arm)   Pulse 65   Temp 97.9 F (36.6 C) (Oral)   Resp 16   SpO2 97%   Physical Exam awake, alert. Chest with few scattered rhonchi bilaterally. Heart with regular rate and rhythm. Abdomen soft, positive bowel sounds, nontender. No lower extremity edema.  Imaging: No results found.  Labs:  CBC:  Recent Labs  10/19/15 1410  06/24/16 1317 07/19/16 1519 07/29/16 1327 08/27/16 0952    WBC 6.0  < > 6.0 5.9 6.3 5.1  HGB 12.1  --   --   --  12.7 12.8  HCT 35.6*  < > 39.0 35.6 37.9 38.9  PLT 281  < > 299 358 313 270  < > = values in this interval not displayed.  COAGS:  Recent Labs  08/27/16 0952 09/09/16 1524  INR 1.14 1.2*  APTT 34  --     BMP:  Recent Labs  05/21/16 1449 06/24/16 1317 07/19/16 1519 07/29/16 1327  NA 140 141 138 137  K 4.0 4.2 3.9 3.9  CL 97 99 98 102  CO2 '28 29 25 30  '$ GLUCOSE 86 76 78 94  BUN '15 18 9 12  '$ CALCIUM 10.0 10.3 9.6 9.6  CREATININE 0.75 0.77 0.82 0.85  GFRNONAA 76 74 68 >60  GFRAA 88 85 79 >60    LIVER FUNCTION TESTS:  Recent Labs  05/21/16 1449 06/24/16 1317 07/19/16 1519 07/29/16 1327  BILITOT 0.3 0.3 0.3 0.7  AST '13 14 13 16  '$ ALT '8 8 9 '$ 13*  ALKPHOS 70 73 74 67  PROT 6.6 7.0 6.8 7.6  ALBUMIN 4.0 4.0 3.7 3.7    Assessment and Plan: Pt with history of recently diagnosed 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 Risks and benefits discussed with the patient/daughters including, but not limited to bleeding, infection, pneumothorax, or fibrin sheath development and need for additional procedures. All of the patient's questions were answered, patient is agreeable to proceed. Consent signed and in chart. Labs pending.      Electronically Signed: D. Rowe Robert, PA-C 09/17/2016, 11:46 AM   I spent a total of 20 minutes at the the patient's bedside AND on the patient's hospital floor or unit, greater than 50% of which was counseling/coordinating care for Port-A-Cath placement

## 2016-09-17 NOTE — Procedures (Signed)
Interventional Radiology Procedure Note  Procedure: Placement of a right IJ approach single lumen PowerPort.  Tip is positioned at the superior cavoatrial junction and catheter is ready for immediate use.  Complications: No immediate Recommendations:  - Ok to shower tomorrow - Do not submerge for 7 days - Routine line care   Signed,  Heath K. McCullough, MD   

## 2016-09-21 ENCOUNTER — Telehealth (HOSPITAL_COMMUNITY): Payer: Self-pay | Admitting: Adult Health

## 2016-09-21 ENCOUNTER — Telehealth (HOSPITAL_COMMUNITY): Payer: Self-pay | Admitting: Emergency Medicine

## 2016-09-21 ENCOUNTER — Encounter: Payer: Self-pay | Admitting: Pharmacist

## 2016-09-21 DIAGNOSIS — R590 Localized enlarged lymph nodes: Secondary | ICD-10-CM | POA: Diagnosis not present

## 2016-09-21 DIAGNOSIS — I4891 Unspecified atrial fibrillation: Secondary | ICD-10-CM | POA: Diagnosis not present

## 2016-09-21 DIAGNOSIS — I10 Essential (primary) hypertension: Secondary | ICD-10-CM | POA: Diagnosis not present

## 2016-09-21 DIAGNOSIS — J449 Chronic obstructive pulmonary disease, unspecified: Secondary | ICD-10-CM | POA: Diagnosis not present

## 2016-09-21 DIAGNOSIS — K769 Liver disease, unspecified: Secondary | ICD-10-CM | POA: Diagnosis not present

## 2016-09-21 DIAGNOSIS — C3411 Malignant neoplasm of upper lobe, right bronchus or lung: Secondary | ICD-10-CM | POA: Diagnosis not present

## 2016-09-21 NOTE — Telephone Encounter (Signed)
Called daughter [name] explained we would started chemotherapy on Monday 10/04/2016. I will do teaching that day.  Explained to apply emla cream about 1 hour prior to chemo but after radiation.  She verbalized understanding.

## 2016-09-21 NOTE — Telephone Encounter (Signed)
I spoke with Dr. Quitman Livings at [facility] regarding radiation oncology plans for this patient.   He is planning on obtaining MRI brain for her.   Radiation simulation was completed today and he anticipates that she will begin radiation therapy in about 7 days.   I will make our nurse navigator aware.     Mike Craze, NP [place] [phone]

## 2016-09-22 ENCOUNTER — Ambulatory Visit (INDEPENDENT_AMBULATORY_CARE_PROVIDER_SITE_OTHER): Payer: Medicare Other | Admitting: Pharmacist

## 2016-09-22 ENCOUNTER — Encounter (HOSPITAL_COMMUNITY): Payer: Self-pay | Admitting: Emergency Medicine

## 2016-09-22 DIAGNOSIS — M47812 Spondylosis without myelopathy or radiculopathy, cervical region: Secondary | ICD-10-CM | POA: Diagnosis not present

## 2016-09-22 DIAGNOSIS — G3189 Other specified degenerative diseases of nervous system: Secondary | ICD-10-CM | POA: Diagnosis not present

## 2016-09-22 DIAGNOSIS — C3490 Malignant neoplasm of unspecified part of unspecified bronchus or lung: Secondary | ICD-10-CM | POA: Diagnosis not present

## 2016-09-22 DIAGNOSIS — I4891 Unspecified atrial fibrillation: Secondary | ICD-10-CM

## 2016-09-22 DIAGNOSIS — C3411 Malignant neoplasm of upper lobe, right bronchus or lung: Secondary | ICD-10-CM | POA: Diagnosis not present

## 2016-09-22 LAB — COAGUCHEK XS/INR WAIVED
INR: 2.2 — ABNORMAL HIGH (ref 0.9–1.1)
Prothrombin Time: 26.1 s

## 2016-09-22 NOTE — Progress Notes (Signed)
Chemotherapy teaching completed and appts made, appts faxed to [facility]

## 2016-09-22 NOTE — Patient Instructions (Signed)
Alexandria Chen   CHEMOTHERAPY INSTRUCTIONS  You have been diagnosed with Stage 3 Non-Small Cell Lung Cancer.  We are going to treat you with carboplatin and taxol concurrently with radiation.   You will have 6 weekly treatments of chemotherapy.  This is treatment is with curative intent.   You will see the doctor regularly throughout treatment.  We monitor your lab work prior to every treatment.  The doctor monitors your response to treatment by the way you are feeling, your blood work, and scans periodically.   You will have the following premedications prior to receiving chemotherapy: Premeds: Benadryl:  Help prevent a reaction to the chemotherapy.  Pepcid: antihistamine premed Aloxi - high powered nausea/vomiting prevention medication used for chemotherapy patients.  Dexamethasone - steroid - given to reduce the risk of you having an allergic type reaction to the chemotherapy. Dex can cause you to feel energized, nervous/anxious/jittery, make you have trouble sleeping, and/or make you feel hot/flushed in the face/neck and/or look pink/red in the face/neck. These side effects will pass as the Dex wears off. (takes 20 minutes to infuse)   POTENTIAL SIDE EFFECTS OF TREATMENT:  Carboplatin (Generic Name) Other Names: Paraplatin, CBDCA  About This Drug Carboplatin is a drug used to treat cancer. This drug is given in the vein (IV). This drug takes about 30 minutes to infuse.   Possible Side Effects (More Common) . Nausea and throwing up (vomiting). These symptoms may happen within a few hours after your treatment and may last up to 24 hours. Medicines are available to stop or lessen these side effects. . Bone marrow depression. This is a decrease in the number of white blood cells, red blood cells, and platelets. This may raise your risk of infection, make you tired and weak (fatigue), and raise your risk of bleeding. . Soreness of the mouth and throat. You may  have red areas, white patches, or sores that hurt. . This drug may affect how your kidneys work. Your kidney function will be checked as needed. . Electrolyte changes. Your blood will be checked for electrolyte changes as needed.  Side Effects (Less Common) . Hair loss. Some patients lose their hair on the scalp and body. You may notice your hair thinning seven to 14 days after getting this drug. . Effects on the nerves are called peripheral neuropathy. You may feel numbness, tingling, or pain in your hands and feet. It may be hard for you to button your clothes, open jars, or walk as usual. The effect on the nerves may get worse with more doses of the drug. These effects get better in some people after the drug is stopped but it does not get better in all people. . Loose bowel movements (diarrhea) that may last for several days . Decreased hearing or ringing in the ears . Changes in the way food and drinks taste . Changes in liver function. Your liver function will be checked as needed.  Allergic Reactions Serious allergic reactions including anaphylaxis are rare. While you are getting this drug in your vein (IV), tell your nurse right away if you have any of these symptoms of an allergic reaction: . Trouble catching your breath . Feeling like your tongue or throat are swelling . Feeling your heart beat quickly or in a not normal way (palpitations) . Feeling dizzy or lightheaded . Flushing, itching, rash, and/or hives  Treating Side Effects . Drink 6-8 cups of fluids each day unless your doctor  has told you to limit your fluid intake due to some other health problem. A cup is 8 ounces of fluid. If you throw up or have loose bowel movements, you should drink more fluids so that you do not become dehydrated (lack water in the body from losing too much fluid). . Mouth care is very important. Your mouth care should consist of routine, gentle cleaning of your teeth or dentures and rinsing your  mouth with a mixture of 1/2 teaspoon of salt in 8 ounces of water or  teaspoon of baking soda in 8 ounces of water. This should be done at least after each meal and at bedtime. . If you have mouth sores, avoid mouthwash that has alcohol. Avoid alcohol and smoking because they can bother your mouth and throat. . If you have numbness and tingling in your hands and feet, be careful when cooking, walking, and handling sharp objects and hot liquids. . Talk with your nurse about getting a wig before you lose your hair. Also, call the [place] at 800-ACS-2345 to find out information about the "Look Good, Feel Better" program close to where you live. It is a free program where women getting chemotherapy can learn about wigs, turbans and scarves as well as makeup techniques and skin and nail care.  Food and Drug Interactions There are no known interactions of carboplatin with food. This drug may interact with other medicines. Tell your doctor and pharmacist about all the medicines and dietary supplements (vitamins, minerals, herbs and others) that you are taking at this time. The safety and use of dietary supplements and alternative diets are often not known. Using these might affect your cancer or interfere with your treatment. Until more is known, you should not use dietary supplements or alternative diets without your doctor's help.  When to Call the Doctor Call your doctor or nurse right away if you have any of these symptoms: . Fever of 100.5 F (38 C) or above; chills . Bleeding or bruising that is not normal . Wheezing or trouble breathing . Nausea that stops you from eating or drinking . Throwing up more than once a day . Rash or itching . Loose bowel movements (diarrhea) more than four times a day or diarrhea with weakness or feeling lightheaded . Call your doctor or nurse as soon as possible if any of these symptoms happen: . Numbness, tingling, decreased feeling or weakness in  fingers, toes, arms, or legs . Change in hearing, ringing in the ears . Blurred vision or other changes in eyesight . Decreased urine . Yellowing of skin or eyes  Problems and Reproductive Concerns Sexual problems and reproduction concerns may happen. In both men and women, this drug may affect your ability to have children. This cannot be determined before your treatment. Talk with your doctor or nurse if you plan to have children. Ask for information on sperm or egg banking. In men, this drug may interfere with your ability to make sperm, but it should not change your ability to have sexual relations. In women, menstrual bleeding may become irregular or stop while you are getting this drug. Do not assume that you cannot become pregnant if you do not have a menstrual period. Women may go through signs of menopause (change of life) like vaginal dryness or itching. Vaginal lubricants can be used to lessen vaginal dryness, itching, and pain during sexual relations. Genetic counseling is available for you to talk about the effects of this drug therapy  on future pregnancies. Also, a genetic counselor can look at the possible risk of problems in the unborn baby due to this medicine if an exposure happens during pregnancy. . Pregnancy warning: This drug may have harmful effects on the unborn child, so effective methods of birth control should be used during your cancer treatment. . Breast feeding warning: It is not known if this drug passes into breast milk. For this reason, women should talk to their doctor about the risks and benefits of breast feeding during treatment with this drug because this drug may enter the breast milk and badly harm a breast feeding baby.    Paclitaxel (Taxol)  Paclitaxel is a drug used to treat cancer. It is given in the vein (IV).  This will take 1 hour to infuse. The first infusion will take about 2.5 hours to infuse because it is infuse slower to watch for infusion  reactions.    Possible Side Effects . Hair loss. Hair loss is often temporary, although with certain medicine, hair loss can sometimes be permanent. Hair loss may happen suddenly or gradually. If you lose hair, you may lose it from your head, face, armpits, pubic area, chest, and/or legs. You may also notice your hair getting thin. . Swelling of your legs, ankles and/or feet (edema) . Flushing . Nausea and throwing up (vomiting) . Loose bowel movements (diarrhea) . Bone marrow depression. This is a decrease in the number of white blood cells, red blood cells, and platelets. This may raise your risk of infection, make you tired and weak (fatigue), and raise your risk of bleeding. . Effects on the nerves are called peripheral neuropathy. You may feel numbness, tingling, or pain in your hands and feet. It may be hard for you to button your clothes, open jars, or walk as usual. The effect on the nerves may get worse with more doses of the drug. These effects get better in some people after the drug is stopped but it does not get better in all people. . Changes in your liver function . Bone, joint and muscle pain . Abnormal EKG . Allergic reaction: Allergic reactions, including anaphylaxis are rare but may happen in some patients. Signs of allergic reaction to this drug may be swelling of the face, feeling like your tongue or throat are swelling, trouble breathing, rash, itching, fever, chills, feeling dizzy, and/or feeling that your heart is beating in a fast or not normal way. If this happens, do not take another dose of this drug. You should get urgent medical treatment. . Infection . Changes in your kidney function. Note: Each of the side effects above was reported in 20% or greater of patients treated with paclitaxel. Not all possible side effects are included above. Warnings and Precautions . Severe bone marrow depression  Side Effects . To help with hair loss, wash with a mild shampoo and  avoid washing your hair every day. . Avoid rubbing your scalp, instead, pat your hair or scalp dry . Avoid coloring your hair . Limit your use of hair spray, electric curlers, blow dryers, and curling irons. . If you are interested in getting a wig, talk to your nurse. You can also call the [place] at 800-ACS-2345 to find out information about the "Look Good, Feel Better" program close to where you live. It is a free program where women getting chemotherapy can learn about wigs, turbans and scarves as well as makeup techniques and skin and nail care. . Ask your  doctor or nurse about medicines that are available to help stop or lessen diarrhea and/or nausea. . To help with nausea and vomiting, eat small, frequent meals instead of three large meals a day. Choose foods and drinks that are at room temperature. Ask your nurse or doctor about other helpful tips and medicine that is available to help or stop lessen these symptoms. . If you get diarrhea, eat low-fiber foods that are high in protein and calories and avoid foods that can irritate your digestive tracts or lead to cramping. Ask your nurse or doctor about medicine that can lessen or stop your diarrhea. . Mouth care is very important. Your mouth care should consist of routine, gentle cleaning of your teeth or dentures and rinsing your mouth with a mixture of 1/2 teaspoon of salt in 8 ounces of water or  teaspoon of baking soda in 8 ounces of water. This should be done at least after each meal and at bedtime. . If you have mouth sores, avoid mouthwash that has alcohol. Also avoid alcohol and smoking because they can bother your mouth and throat. . Drink plenty of fluids (a minimum of eight glasses per day is recommended). . Take your temperature as your doctor or nurse tells you, and whenever you feel like you may have a fever. . Talk to your doctor or nurse about precautions you can take to avoid infections and bleeding. . Be careful  when cooking, walking, and handling sharp objects and hot liquids.  Food and Drug Interactions . There are no known interactions of paclitaxel with food. . This drug may interact with other medicines. Tell your doctor and pharmacist about all the medicines and dietary supplements (vitamins, minerals, herbs and others) that you are taking at this time. . The safety and use of dietary supplements and alternative diets are often not known. Using these might affect your cancer or interfere with your treatment. Until more is known, you should not use dietary supplements or alternative diets without your cancer doctor's help.  When to Call the Doctor Call your doctor or nurse if you have any of the following symptoms and/or any new or unusual symptoms: . Fever of 100.5 F (38 C) or above . Chills . Redness, pain, warmth, or swelling at the IV site during the infusion . Signs of allergic reaction: swelling of the face, feeling like your tongue or throat are swelling, trouble breathing, rash, itching, fever, chills, feeling dizzy, and/or feeling that your heart is beating in a fast or not normal way . Feeling that your heart is beating in a fast or not normal way (palpitations) . Weight gain of 5 pounds in one week (fluid retention) . Decreased urine or very dark urine . Signs of liver problems: dark urine, pale bowel movements, bad stomach pain, feeling very tired and weak, unusual itching, or yellowing of the eyes or skin . Heavy menstrual period that lasts longer than normal . Easy bruising or bleeding . Nausea that stops you from eating or drinking, and/or that is not relieved by prescribed medicines. . Loose bowel movements (diarrhea) more than 4 times a day or diarrhea with weakness or lightheadedness . Pain in your mouth or throat that makes it hard to eat or drink . Lasting loss of appetite or rapid weight loss of five pounds in a week . Signs of peripheral neuropathy: numbness, tingling, or  decreased feeling in fingers or toes; trouble walking or changes in the way you walk; or feeling clumsy  when buttoning clothes, opening jars, or other routine activities . Joint and muscle pain that is not relieved by prescribed medicines . Extreme fatigue that interferes with normal activities . While you are getting this drug, please tell your nurse right away if you have any pain, redness, or swelling at the site of the IV infusion. . If you think you are pregnant.  Reproduction Warnings . Pregnancy warning: This drug may have harmful effects on the unborn child, it is recommended that effective methods of birth control should be used during your cancer treatment. Let your doctor know right away if you think you may be pregnant. . Breast feeding warning: Women should not breast feed during treatment because this drug could enter the breast milk and cause harm to a breast feeding baby.     SELF CARE ACTIVITIES WHILE ON CHEMOTHERAPY: Hydration Increase your fluid intake 48 hours prior to treatment and drink at least 8 to 12 cups (64 ounces) of water/decaff beverages per day after treatment. You can still have your cup of coffee or soda but these beverages do not count as part of your 8 to 12 cups that you need to drink daily. No alcohol intake.  Medications Continue taking your normal prescription medication as prescribed.  If you start any new herbal or new supplements please let us know first to make sure it is safe.  Mouth Care Have teeth cleaned professionally before starting treatment. Keep dentures and partial plates clean. Use soft toothbrush and do not use mouthwashes that contain alcohol. Biotene is a good mouthwash that is available at most pharmacies or may be ordered by calling [phone]. Use warm salt water gargles (1 teaspoon salt per 1 quart warm water) before and after meals and at bedtime. Or you may rinse with 2 tablespoons of three-percent hydrogen peroxide mixed in  eight ounces of water. If you are still having problems with your mouth or sores in your mouth please call the clinic. If you need dental work, please let the doctor know before you go for your appointment so that we can coordinate the best possible time for you in regards to your chemo regimen. You need to also let your dentist know that you are actively taking chemo. We may need to do labs prior to your dental appointment.   Skin Care Always use sunscreen that has not expired and with SPF (Sun Protection Factor) of 50 or higher. Wear hats to protect your head from the sun. Remember to use sunscreen on your hands, ears, face, & feet.  Use good moisturizing lotions such as udder cream, eucerin, or even Vaseline. Some chemotherapies can cause dry skin, color changes in your skin and nails.    . Avoid long, hot showers or baths. . Use gentle, fragrance-free soaps and laundry detergent. . Use moisturizers, preferably creams or ointments rather than lotions because the thicker consistency is better at preventing skin dehydration. Apply the cream or ointment within 15 minutes of showering. Reapply moisturizer at night, and moisturize your hands every time after you wash them.  Hair Loss (if your doctor says your hair will fall out)  . If your doctor says that your hair is likely to fall out, decide before you begin chemo whether you want to wear a wig. You may want to shop before treatment to match your hair color. . Hats, turbans, and scarves can also camouflage hair loss, although some people prefer to leave their heads uncovered. If you go bare-headed outdoors,  be sure to use sunscreen on your scalp. . Cut your hair short. It eases the inconvenience of shedding lots of hair, but it also can reduce the emotional impact of watching your hair fall out. . Don't perm or color your hair during chemotherapy. Those chemical treatments are already damaging to hair and can enhance hair loss. Once your chemo  treatments are done and your hair has grown back, it's OK to resume dyeing or perming hair. With chemotherapy, hair loss is almost always temporary. But when it grows back, it may be a different color or texture. In older adults who still had hair color before chemotherapy, the new growth may be completely gray.  Often, new hair is very fine and soft.  Infection Prevention Please wash your hands for at least 30 seconds using warm soapy water. Handwashing is the #1 way to prevent the spread of germs. Stay away from sick people or people who are getting over a cold. If you develop respiratory systems such as green/yellow mucus production or productive cough or persistent cough let us know and we will see if you need an antibiotic. It is a good idea to keep a pair of gloves on when going into grocery stores/[employer] to decrease your risk of coming into contact with germs on the carts, etc. Carry alcohol hand gel with you at all times and use it frequently if out in public. If your temperature reaches 100.5 or higher please call the clinic and let us know.  If it is after hours or on the weekend please go to the ER if your temperature is over 100.5.  Please have your own personal thermometer at home to use.    Sex and bodily fluids If you are going to have sex, a condom must be used to protect the person that isn't taking chemotherapy. Chemo can decrease your libido (sex drive). For a few days after chemotherapy, chemotherapy can be excreted through your bodily fluids.  When using the toilet please close the lid and flush the toilet twice.  Do this for a few day after you have had chemotherapy.   Effects of chemotherapy on your sex life Some changes are simple and won't last long. They won't affect your sex life permanently. Sometimes you may feel: . too tired . not strong enough to be very active . sick or sore  . not in the mood . anxious or low Your anxiety might not seem related to sex. For example,  you may be worried about the cancer and how your treatment is going. Or you may be worried about money, or about how you family are coping with your illness. These things can cause stress, which can affect your interest in sex. It's important to talk to your partner about how you feel. Remember - the changes to your sex life don't usually last long. There's usually no medical reason to stop having sex during chemo. The drugs won't have any long term physical effects on your performance or enjoyment of sex. Cancer can't be passed on to your partner during sex  Contraception It's important to use reliable contraception during treatment. Avoid getting pregnant while you or your partner are having chemotherapy. This is because the drugs may harm the baby. Sometimes chemotherapy drugs can leave a man or woman infertile.  This means you would not be able to have children in the future. You might want to talk to someone about permanent infertility. It can be very difficult to learn  that you may no longer be able to have children. Some people find counselling helpful. There might be ways to preserve your fertility, although this is easier for men than for women. You may want to speak to a fertility expert. You can talk about sperm banking or harvesting your eggs. You can also ask about other fertility options, such as donor eggs. If you have or have had breast cancer, your doctor might advise you not to take the contraceptive pill. This is because the hormones in it might affect the cancer.  It is not known for sure whether or not chemotherapy drugs can be passed on through semen or secretions from the vagina. Because of this some doctors advise people to use a barrier method if you have sex during treatment. This applies to vaginal, anal or oral sex. Generally, doctors advise a barrier method only for the time you are actually having the treatment and for about a week after your treatment. Advice like this can be  worrying, but this does not mean that you have to avoid being intimate with your partner. You can still have close contact with your partner and continue to enjoy sex.  Animals If you have cats or birds we just ask that you not change the litter or change the cage.  Please have someone else do this for you while you are on chemotherapy.   Food Safety During and After Cancer Treatment Food safety is important for people both during and after cancer treatment. Cancer and cancer treatments, such as chemotherapy, radiation therapy, and stem cell/bone marrow transplantation, often weaken the immune system. This makes it harder for your body to protect itself from foodborne illness, also called food poisoning. Foodborne illness is caused by eating food that contains harmful bacteria, parasites, or viruses.  Foods to avoid Some foods have a higher risk of becoming tainted with bacteria. These include: Marland Kitchen Unwashed fresh fruit and vegetables, especially leafy vegetables that can hide dirt and other contaminants . Raw sprouts, such as alfalfa sprouts . Raw or undercooked beef, especially ground beef, or other raw or undercooked meat and poultry . Fatty, fried, or spicy foods immediately before or after treatment.  These can sit heavy on your stomach and make you feel nauseous. . Raw or undercooked shellfish, such as oysters. . Sushi and sashimi, which often contain raw fish.  . Unpasteurized beverages, such as unpasteurized fruit juices, raw milk, raw yogurt, or cider . Undercooked eggs, such as soft boiled, over easy, and poached; raw, unpasteurized eggs; or foods made with raw egg, such as homemade raw cookie dough and homemade mayonnaise Simple steps for food safety Shop smart. . Do not buy food stored or displayed in an unclean area. . Do not buy bruised or damaged fruits or vegetables. . Do not buy cans that have cracks, dents, or bulges. . Pick up foods that can spoil at the end of your shopping  trip and store them in a cooler on the way home. Prepare and clean up foods carefully. . Rinse all fresh fruits and vegetables under running water, and dry them with a clean towel or paper towel. . Clean the top of cans before opening them. . After preparing food, wash your hands for 20 seconds with hot water and soap. Pay special attention to areas between fingers and under nails. . Clean your utensils and dishes with hot water and soap. Marland Kitchen Disinfect your kitchen and cutting boards using 1 teaspoon of liquid, unscented bleach mixed  into 1 quart of water.   Dispose of old food. . Eat canned and packaged food before its expiration date (the "use by" or "best before" date). . Consume refrigerated leftovers within 3 to 4 days. After that time, throw out the food. Even if the food does not smell or look spoiled, it still may be unsafe. Some bacteria, such as Listeria, can grow even on foods stored in the refrigerator if they are kept for too long. Take precautions when eating out. . At restaurants, avoid buffets and salad bars where food sits out for a long time and comes in contact with many people. Food can become contaminated when someone with a virus, often a norovirus, or another "bug" handles it. . Put any leftover food in a "to-go" container yourself, rather than having the server do it. And, refrigerate leftovers as soon as you get home. . Choose restaurants that are clean and that are willing to prepare your food as you order it cooked.    MEDICATIONS: Lorazepam (Ativan) 0.5 mg tablets:   Take 1 tablet (0.5 mg total) by mouth every 6 (six) hours as needed (Nausea or vomiting).      (#2 nausea med to take, this can make you sleepy)                Compazine/Prochlorperazine 10mg  tablet. Take 1 tablet every 6 hours as needed for nausea/vomiting. (#1 nausea med to take, this can make you sleepy)   EMLA cream. Apply a quarter size amount to port site 1 hour prior to chemo. Do not rub in. Cover  with plastic wrap.   Over-the-Counter Meds:  Miralax 1 capful in 8 oz of fluid daily. May increase to two times a day if needed. This is a stool softener. If this doesn't work proceed you can add:  Senokot S-start with 1 tablet two times a day and increase to 4 tablets two times a day if needed. (total of 8 tablets in a 24 hour period). This is a stimulant laxative.   Call us if this does not help your bowels move.   Imodium 2mg  capsule. Take 2 capsules after the 1st loose stool and then 1 capsule every 2 hours until you go a total of 12 hours without having a loose stool. Call the [facility] if loose stools continue. If diarrhea occurs @ bedtime, take 2 capsules @ bedtime. Then take 2 capsules every 4 hours until morning. Call [facility].   Diarrhea Sheet  If you are having loose stools/diarrhea, please purchase Imodium and begin taking as outlined:  At the first sign of poorly formed or loose stools you should begin taking Imodium(loperamide) 2 mg capsules.  Take two caplets (4mg ) followed by one caplet (2mg ) every 2 hours until you have had no diarrhea for 12 hours.  During the night take two caplets (4mg ) at bedtime and continue every 4 hours during the night until the morning.  Stop taking Imodium only after there is no sign of diarrhea for 12 hours.    Always call the [facility] if you are having loose stools/diarrhea that you can't get under control.  Loose stools/disrrhea leads to dehydration (loss of water) in your body.  We have other options of trying to get the loose stools/diarrhea to stopped but you must let us know!    Constipation Sheet *Miralax in 8 oz of fluid daily.  May increase to two times a day if needed.  This is a stool softener.  If this not enough to keep your bowel regular:  You can add:  *Senokot S, start with one tablet twice a day and can increase to 4 tablets twice a day if needed.  This is a stimulant laxative.   Sometimes when you take pain  medication you need BOTH a medicine to keep your stool soft and a medicine to help your bowel push it out!  Please call if the above does not work for you.   Do not go more than 2 days without a bowel movement.  It is very important that you do not become constipated.  It will make you feel sick to your stomach (nausea) and can cause abdominal pain and vomiting.     SYMPTOMS TO REPORT AS SOON AS POSSIBLE AFTER TREATMENT:  FEVER GREATER THAN 100.5 F  CHILLS WITH OR WITHOUT FEVER  NAUSEA AND VOMITING THAT IS NOT CONTROLLED WITH YOUR NAUSEA MEDICATION  UNUSUAL SHORTNESS OF BREATH  UNUSUAL BRUISING OR BLEEDING  TENDERNESS IN MOUTH AND THROAT WITH OR WITHOUT PRESENCE OF ULCERS  URINARY PROBLEMS  BOWEL PROBLEMS  UNUSUAL RASH    Wear comfortable clothing and clothing appropriate for easy access to any Portacath or PICC line. Let us know if there is anything that we can do to make your therapy better!    What to do if you need assistance after hours or on the weekends: CALL [phone].  HOLD on the line, do not hang up.  You will hear multiple messages but at the end you will be connected with a nurse triage line.  They will contact the doctor if necessary.  Most of the time they will be able to assist you.  Do not call the hospital operator.    I have been informed and understand all of the instructions given to me and have received a copy. I have been instructed to call the clinic [phone] or my family physician as soon as possible for continued medical care, if indicated. I do not have any more questions at this time but understand that I may call the [facility] or the Patient Navigator at [phone] during office hours should I have questions or need assistance in obtaining follow-up care.

## 2016-09-28 ENCOUNTER — Ambulatory Visit (HOSPITAL_COMMUNITY): Payer: Self-pay | Admitting: Oncology

## 2016-09-28 ENCOUNTER — Ambulatory Visit (HOSPITAL_COMMUNITY): Payer: Self-pay | Admitting: Psychiatry

## 2016-09-28 DIAGNOSIS — K769 Liver disease, unspecified: Secondary | ICD-10-CM | POA: Diagnosis not present

## 2016-09-28 DIAGNOSIS — I10 Essential (primary) hypertension: Secondary | ICD-10-CM | POA: Diagnosis not present

## 2016-09-28 DIAGNOSIS — J449 Chronic obstructive pulmonary disease, unspecified: Secondary | ICD-10-CM | POA: Diagnosis not present

## 2016-09-28 DIAGNOSIS — I4891 Unspecified atrial fibrillation: Secondary | ICD-10-CM | POA: Diagnosis not present

## 2016-09-28 DIAGNOSIS — C3411 Malignant neoplasm of upper lobe, right bronchus or lung: Secondary | ICD-10-CM | POA: Diagnosis not present

## 2016-09-28 DIAGNOSIS — R590 Localized enlarged lymph nodes: Secondary | ICD-10-CM | POA: Diagnosis not present

## 2016-09-29 ENCOUNTER — Ambulatory Visit (INDEPENDENT_AMBULATORY_CARE_PROVIDER_SITE_OTHER): Payer: Medicare Other | Admitting: Pharmacist

## 2016-09-29 ENCOUNTER — Telehealth (HOSPITAL_COMMUNITY): Payer: Self-pay | Admitting: *Deleted

## 2016-09-29 DIAGNOSIS — I4891 Unspecified atrial fibrillation: Secondary | ICD-10-CM | POA: Diagnosis not present

## 2016-09-29 DIAGNOSIS — J449 Chronic obstructive pulmonary disease, unspecified: Secondary | ICD-10-CM | POA: Diagnosis not present

## 2016-09-29 DIAGNOSIS — C3411 Malignant neoplasm of upper lobe, right bronchus or lung: Secondary | ICD-10-CM | POA: Diagnosis not present

## 2016-09-29 DIAGNOSIS — R590 Localized enlarged lymph nodes: Secondary | ICD-10-CM | POA: Diagnosis not present

## 2016-09-29 DIAGNOSIS — K769 Liver disease, unspecified: Secondary | ICD-10-CM | POA: Diagnosis not present

## 2016-09-29 DIAGNOSIS — I10 Essential (primary) hypertension: Secondary | ICD-10-CM | POA: Diagnosis not present

## 2016-09-29 LAB — COAGUCHEK XS/INR WAIVED
INR: 4.3 — ABNORMAL HIGH (ref 0.9–1.1)
PROTHROMBIN TIME: 51.3 s

## 2016-09-29 NOTE — Telephone Encounter (Signed)
Called and had to leave message for [name].

## 2016-09-30 ENCOUNTER — Other Ambulatory Visit (HOSPITAL_COMMUNITY): Payer: Self-pay

## 2016-09-30 DIAGNOSIS — I4891 Unspecified atrial fibrillation: Secondary | ICD-10-CM | POA: Diagnosis not present

## 2016-09-30 DIAGNOSIS — J449 Chronic obstructive pulmonary disease, unspecified: Secondary | ICD-10-CM | POA: Diagnosis not present

## 2016-09-30 DIAGNOSIS — C3411 Malignant neoplasm of upper lobe, right bronchus or lung: Secondary | ICD-10-CM | POA: Diagnosis not present

## 2016-09-30 DIAGNOSIS — I10 Essential (primary) hypertension: Secondary | ICD-10-CM | POA: Diagnosis not present

## 2016-09-30 DIAGNOSIS — R590 Localized enlarged lymph nodes: Secondary | ICD-10-CM | POA: Diagnosis not present

## 2016-09-30 DIAGNOSIS — K769 Liver disease, unspecified: Secondary | ICD-10-CM | POA: Diagnosis not present

## 2016-09-30 NOTE — Telephone Encounter (Signed)
Was finally able to get up with Tessie Fass, pharmacist from [facility]. She wanted to know if we could check patients PT/INR on Monday. She states she will continue to monitor and adjust coumadin, she just needs patients levels checked. Reviewed with provider, orders entered.

## 2016-09-30 NOTE — Telephone Encounter (Signed)
Called again and left another message for [name].

## 2016-10-04 ENCOUNTER — Encounter (HOSPITAL_COMMUNITY): Payer: Medicare Other | Attending: Oncology

## 2016-10-04 ENCOUNTER — Encounter (HOSPITAL_COMMUNITY): Payer: Self-pay

## 2016-10-04 ENCOUNTER — Encounter (HOSPITAL_COMMUNITY): Payer: Medicare Other

## 2016-10-04 VITALS — BP 150/67 | HR 69 | Temp 98.3°F | Resp 18 | Wt 134.4 lb

## 2016-10-04 DIAGNOSIS — Z510 Encounter for antineoplastic radiation therapy: Secondary | ICD-10-CM | POA: Diagnosis not present

## 2016-10-04 DIAGNOSIS — R918 Other nonspecific abnormal finding of lung field: Secondary | ICD-10-CM | POA: Diagnosis not present

## 2016-10-04 DIAGNOSIS — Z5111 Encounter for antineoplastic chemotherapy: Secondary | ICD-10-CM

## 2016-10-04 DIAGNOSIS — I4891 Unspecified atrial fibrillation: Secondary | ICD-10-CM

## 2016-10-04 DIAGNOSIS — C3411 Malignant neoplasm of upper lobe, right bronchus or lung: Secondary | ICD-10-CM | POA: Diagnosis not present

## 2016-10-04 DIAGNOSIS — I10 Essential (primary) hypertension: Secondary | ICD-10-CM

## 2016-10-04 DIAGNOSIS — C3491 Malignant neoplasm of unspecified part of right bronchus or lung: Secondary | ICD-10-CM

## 2016-10-04 LAB — COMPREHENSIVE METABOLIC PANEL
ALK PHOS: 67 U/L (ref 38–126)
ALT: 12 U/L — AB (ref 14–54)
AST: 18 U/L (ref 15–41)
Albumin: 3.6 g/dL (ref 3.5–5.0)
Anion gap: 6 (ref 5–15)
BUN: 14 mg/dL (ref 6–20)
CALCIUM: 9.9 mg/dL (ref 8.9–10.3)
CO2: 29 mmol/L (ref 22–32)
CREATININE: 0.78 mg/dL (ref 0.44–1.00)
Chloride: 107 mmol/L (ref 101–111)
Glucose, Bld: 95 mg/dL (ref 65–99)
Potassium: 4 mmol/L (ref 3.5–5.1)
SODIUM: 142 mmol/L (ref 135–145)
Total Bilirubin: 0.6 mg/dL (ref 0.3–1.2)
Total Protein: 7.4 g/dL (ref 6.5–8.1)

## 2016-10-04 LAB — CBC WITH DIFFERENTIAL/PLATELET
BASOS PCT: 0 %
Basophils Absolute: 0 10*3/uL (ref 0.0–0.1)
EOS ABS: 0.1 10*3/uL (ref 0.0–0.7)
EOS PCT: 1 %
HCT: 36.9 % (ref 36.0–46.0)
Hemoglobin: 12.2 g/dL (ref 12.0–15.0)
LYMPHS ABS: 1.5 10*3/uL (ref 0.7–4.0)
Lymphocytes Relative: 28 %
MCH: 29.7 pg (ref 26.0–34.0)
MCHC: 33.1 g/dL (ref 30.0–36.0)
MCV: 89.8 fL (ref 78.0–100.0)
MONOS PCT: 11 %
Monocytes Absolute: 0.6 10*3/uL (ref 0.1–1.0)
Neutro Abs: 3.2 10*3/uL (ref 1.7–7.7)
Neutrophils Relative %: 60 %
PLATELETS: 288 10*3/uL (ref 150–400)
RBC: 4.11 MIL/uL (ref 3.87–5.11)
RDW: 14.5 % (ref 11.5–15.5)
WBC: 5.3 10*3/uL (ref 4.0–10.5)

## 2016-10-04 LAB — PROTIME-INR
INR: 1.79
Prothrombin Time: 21 seconds — ABNORMAL HIGH (ref 11.4–15.2)

## 2016-10-04 MED ORDER — DIPHENHYDRAMINE HCL 50 MG/ML IJ SOLN
50.0000 mg | Freq: Once | INTRAMUSCULAR | Status: DC
  Filled 2016-10-04: qty 1

## 2016-10-04 MED ORDER — CLONIDINE HCL 0.1 MG PO TABS
0.1000 mg | ORAL_TABLET | Freq: Once | ORAL | Status: AC
  Administered 2016-10-04: 0.1 mg via ORAL
  Filled 2016-10-04: qty 1

## 2016-10-04 MED ORDER — SODIUM CHLORIDE 0.9 % IV SOLN
Freq: Once | INTRAVENOUS | Status: AC
Start: 2016-10-04 — End: 2016-10-04
  Administered 2016-10-04: 11:00:00 via INTRAVENOUS

## 2016-10-04 MED ORDER — SODIUM CHLORIDE 0.9 % IV SOLN
134.8000 mg | Freq: Once | INTRAVENOUS | Status: AC
  Administered 2016-10-04: 130 mg via INTRAVENOUS
  Filled 2016-10-04: qty 13

## 2016-10-04 MED ORDER — SODIUM CHLORIDE 0.9 % IV SOLN
20.0000 mg | Freq: Once | INTRAVENOUS | Status: AC
Start: 2016-10-04 — End: 2016-10-04
  Administered 2016-10-04: 20 mg via INTRAVENOUS
  Filled 2016-10-04: qty 2

## 2016-10-04 MED ORDER — PALONOSETRON HCL INJECTION 0.25 MG/5ML
0.2500 mg | Freq: Once | INTRAVENOUS | Status: AC
  Administered 2016-10-04: 0.25 mg via INTRAVENOUS
  Filled 2016-10-04: qty 5

## 2016-10-04 MED ORDER — SODIUM CHLORIDE 0.9% FLUSH
10.0000 mL | INTRAVENOUS | Status: DC | PRN
  Administered 2016-10-04: 10 mL
  Filled 2016-10-04: qty 10

## 2016-10-04 MED ORDER — HEPARIN SOD (PORK) LOCK FLUSH 100 UNIT/ML IV SOLN
500.0000 [IU] | Freq: Once | INTRAVENOUS | Status: AC | PRN
  Administered 2016-10-04: 500 [IU]
  Filled 2016-10-04: qty 5

## 2016-10-04 MED ORDER — PACLITAXEL CHEMO INJECTION 300 MG/50ML
45.0000 mg/m2 | Freq: Once | INTRAVENOUS | Status: AC
  Administered 2016-10-04: 72 mg via INTRAVENOUS
  Filled 2016-10-04: qty 12

## 2016-10-04 MED ORDER — DIPHENHYDRAMINE HCL 50 MG/ML IJ SOLN
25.0000 mg | Freq: Once | INTRAMUSCULAR | Status: AC
  Administered 2016-10-04: 25 mg via INTRAVENOUS

## 2016-10-04 MED ORDER — FAMOTIDINE IN NACL 20-0.9 MG/50ML-% IV SOLN
20.0000 mg | Freq: Once | INTRAVENOUS | Status: AC
  Administered 2016-10-04: 20 mg via INTRAVENOUS
  Filled 2016-10-04: qty 50

## 2016-10-04 NOTE — Patient Instructions (Signed)
[facility] Discharge Instructions for Patients Receiving Chemotherapy   Beginning January 23rd 2017 lab work for the Cancer Center will be done in the  Main lab at [place] on 1st floor. If you have a lab appointment with the Cancer Center please come in thru the  Main Entrance and check in at the main information desk   Today you received the following chemotherapy agents Taxol and Carboplatin. Follow-up as scheduled. Call clinic for any questions or concerns  To help prevent nausea and vomiting after your treatment, we encourage you to take your nausea medication.   If you develop nausea and vomiting, or diarrhea that is not controlled by your medication, call the clinic.  The clinic phone number is [phone]. Office hours are Monday-Friday 8:30am-5:00pm.  BELOW ARE SYMPTOMS THAT SHOULD BE REPORTED IMMEDIATELY:  *FEVER GREATER THAN 101.0 F  *CHILLS WITH OR WITHOUT FEVER  NAUSEA AND VOMITING THAT IS NOT CONTROLLED WITH YOUR NAUSEA MEDICATION  *UNUSUAL SHORTNESS OF BREATH  *UNUSUAL BRUISING OR BLEEDING  TENDERNESS IN MOUTH AND THROAT WITH OR WITHOUT PRESENCE OF ULCERS  *URINARY PROBLEMS  *BOWEL PROBLEMS  UNUSUAL RASH Items with * indicate a potential emergency and should be followed up as soon as possible. If you have an emergency after office hours please contact your primary care physician or go to the nearest emergency department.  Please call the clinic during office hours if you have any questions or concerns.   You may also contact the Patient Navigator at [phone] should you have any questions or need assistance in obtaining follow up care.      Resources For Cancer Patients and their Caregivers ? American Cancer Society: Can assist with transportation, wigs, general needs, runs Look Good Feel Better.        1-888-227-6333 ? Cancer Care: Provides financial assistance, online support groups, medication/co-pay assistance.   1-800-813-HOPE (4673) ? [facility] Assists [place] cancer patients and their families through emotional , educational and financial support.  336-427-4357 ? [place] DSS Where to apply for food stamps, Medicaid and utility assistance. 336-342-1394 ? RCATS: Transportation to medical appointments. 336-347-2287 ? Social Security Administration: May apply for disability if have a Stage IV cancer. 336-342-7796 1-800-772-1213 ? [place] Aging, Disability and Transit Services: Assists with nutrition, care and transit needs. 336-349-2343         

## 2016-10-04 NOTE — Progress Notes (Signed)
Taisa L Weinert tolerated chemotherapy tx well without complaints or incident. Labs reviewed including PT/INR with Dr. Talbert Cage prior to administering chemotherapy. Pt to continue her Coumadin dose daily per her PCP's instructions.Pt's B/P progressively increased to 199/88 while receiving her Taxol so Dr. Talbert Cage was informed and order obtained for Clonidine 0.1 mg PO per MD. Pt was given this medication and B/P decreased to 150/67 prior to discharge VSS upon discharge. Pt discharged via wheelchair in satisfactory condition accompanied by her daughter

## 2016-10-04 NOTE — Progress Notes (Signed)
Chemotherapy teaching completed.  Consent signed.  Extensive teaching packet given.   

## 2016-10-05 ENCOUNTER — Other Ambulatory Visit (HOSPITAL_COMMUNITY): Payer: Self-pay | Admitting: Oncology

## 2016-10-05 ENCOUNTER — Ambulatory Visit (INDEPENDENT_AMBULATORY_CARE_PROVIDER_SITE_OTHER): Payer: Medicare Other | Admitting: Pharmacist

## 2016-10-05 ENCOUNTER — Other Ambulatory Visit (HOSPITAL_COMMUNITY): Payer: Self-pay | Admitting: Emergency Medicine

## 2016-10-05 ENCOUNTER — Telehealth (HOSPITAL_COMMUNITY): Payer: Self-pay | Admitting: Emergency Medicine

## 2016-10-05 DIAGNOSIS — I4891 Unspecified atrial fibrillation: Secondary | ICD-10-CM | POA: Diagnosis not present

## 2016-10-05 DIAGNOSIS — Z7901 Long term (current) use of anticoagulants: Principal | ICD-10-CM

## 2016-10-05 DIAGNOSIS — Z510 Encounter for antineoplastic radiation therapy: Secondary | ICD-10-CM | POA: Diagnosis not present

## 2016-10-05 DIAGNOSIS — C3411 Malignant neoplasm of upper lobe, right bronchus or lung: Secondary | ICD-10-CM | POA: Diagnosis not present

## 2016-10-05 DIAGNOSIS — Z5181 Encounter for therapeutic drug level monitoring: Secondary | ICD-10-CM

## 2016-10-05 NOTE — Telephone Encounter (Signed)
24 hour call back- Pts daughter called [name],  Pt is having some confusion yesterday and this morning.  It has gotten a little better.  I explained sometimes benadryl in older people can do this.  Spoke with Kirby Crigler PA and he is going to adjust the treatment plan for next chemotherapy to have half of the dose of benadryl.  [name] states that sometimes she will have a UTI and it will cause her to get confused.  I told her to wait to see if it gets better if not then check for UTI.  She verbalized understanding.  She states other wise pt is doing good that she has had tons of energy.  She has been eaten well also.

## 2016-10-05 NOTE — Progress Notes (Signed)
Patient ID: Alexandria Chen, female   DOB: 1935-04-24, 82 y.o.   MRN: [id number]   Subjective:     Indication: atrial fibrillation Bleeding signs/symptoms: None Thromboembolic signs/symptoms: None  Missed Coumadin doses: last week held for 2 day (5/30 and 5/31) due to INR of 4.3 Medication changes: yes - started chemo 10/04/16 Dietary changes: no Bacterial/viral infection: no Other concerns: yes - when called patient's daughter [name] with anticoagulation recommendations she states that patient was very confused this am and concerned that is related to chemo. Kim called nurse coordinator at [facility] but got VM and she did not leave message but planned to call back later.    Objective:    INR Yesterday: 1.79 Current dose: held 2 days last week, then decreased to 2.5mg  1 tablet daily except 1/2 tablet on Wednesdays.    Assessment:    Subtherapeutic INR for goal of 2-3   Plan:    1. New dose: no change   2. Next INR: 1 week   3.  Called nurse at [facility] center - left message about Mrs. Baggerly confusion and about rechecking INR in 1 week.  Awaiting call back

## 2016-10-06 DIAGNOSIS — Z510 Encounter for antineoplastic radiation therapy: Secondary | ICD-10-CM | POA: Diagnosis not present

## 2016-10-06 DIAGNOSIS — C3411 Malignant neoplasm of upper lobe, right bronchus or lung: Secondary | ICD-10-CM | POA: Diagnosis not present

## 2016-10-07 DIAGNOSIS — C3411 Malignant neoplasm of upper lobe, right bronchus or lung: Secondary | ICD-10-CM | POA: Diagnosis not present

## 2016-10-07 DIAGNOSIS — Z510 Encounter for antineoplastic radiation therapy: Secondary | ICD-10-CM | POA: Diagnosis not present

## 2016-10-08 DIAGNOSIS — Z510 Encounter for antineoplastic radiation therapy: Secondary | ICD-10-CM | POA: Diagnosis not present

## 2016-10-08 DIAGNOSIS — C3411 Malignant neoplasm of upper lobe, right bronchus or lung: Secondary | ICD-10-CM | POA: Diagnosis not present

## 2016-10-11 ENCOUNTER — Other Ambulatory Visit (HOSPITAL_COMMUNITY): Payer: Self-pay | Admitting: Emergency Medicine

## 2016-10-11 ENCOUNTER — Other Ambulatory Visit (HOSPITAL_COMMUNITY): Payer: Self-pay | Admitting: Oncology

## 2016-10-11 ENCOUNTER — Ambulatory Visit (INDEPENDENT_AMBULATORY_CARE_PROVIDER_SITE_OTHER): Payer: Medicare Other | Admitting: Pharmacist

## 2016-10-11 ENCOUNTER — Encounter (HOSPITAL_COMMUNITY): Payer: Self-pay

## 2016-10-11 ENCOUNTER — Encounter (HOSPITAL_BASED_OUTPATIENT_CLINIC_OR_DEPARTMENT_OTHER): Payer: Medicare Other | Admitting: Oncology

## 2016-10-11 ENCOUNTER — Encounter (HOSPITAL_BASED_OUTPATIENT_CLINIC_OR_DEPARTMENT_OTHER): Payer: Medicare Other

## 2016-10-11 VITALS — BP 148/81 | HR 75 | Temp 98.4°F | Resp 18 | Wt 134.9 lb

## 2016-10-11 DIAGNOSIS — C3411 Malignant neoplasm of upper lobe, right bronchus or lung: Secondary | ICD-10-CM

## 2016-10-11 DIAGNOSIS — Z5111 Encounter for antineoplastic chemotherapy: Secondary | ICD-10-CM

## 2016-10-11 DIAGNOSIS — Z7901 Long term (current) use of anticoagulants: Principal | ICD-10-CM

## 2016-10-11 DIAGNOSIS — C3491 Malignant neoplasm of unspecified part of right bronchus or lung: Secondary | ICD-10-CM

## 2016-10-11 DIAGNOSIS — Z5181 Encounter for therapeutic drug level monitoring: Secondary | ICD-10-CM

## 2016-10-11 DIAGNOSIS — I4891 Unspecified atrial fibrillation: Secondary | ICD-10-CM

## 2016-10-11 DIAGNOSIS — R918 Other nonspecific abnormal finding of lung field: Secondary | ICD-10-CM | POA: Diagnosis not present

## 2016-10-11 DIAGNOSIS — Z510 Encounter for antineoplastic radiation therapy: Secondary | ICD-10-CM | POA: Diagnosis not present

## 2016-10-11 LAB — CBC WITH DIFFERENTIAL/PLATELET
Basophils Absolute: 0 10*3/uL (ref 0.0–0.1)
Basophils Relative: 0 %
Eosinophils Absolute: 0.1 10*3/uL (ref 0.0–0.7)
Eosinophils Relative: 1 %
HEMATOCRIT: 35.4 % — AB (ref 36.0–46.0)
HEMOGLOBIN: 11.7 g/dL — AB (ref 12.0–15.0)
Lymphocytes Relative: 19 %
Lymphs Abs: 1.1 10*3/uL (ref 0.7–4.0)
MCH: 29.6 pg (ref 26.0–34.0)
MCHC: 33.1 g/dL (ref 30.0–36.0)
MCV: 89.6 fL (ref 78.0–100.0)
MONOS PCT: 9 %
Monocytes Absolute: 0.5 10*3/uL (ref 0.1–1.0)
NEUTROS ABS: 3.9 10*3/uL (ref 1.7–7.7)
NEUTROS PCT: 71 %
Platelets: 281 10*3/uL (ref 150–400)
RBC: 3.95 MIL/uL (ref 3.87–5.11)
RDW: 14.2 % (ref 11.5–15.5)
WBC: 5.5 10*3/uL (ref 4.0–10.5)

## 2016-10-11 LAB — COMPREHENSIVE METABOLIC PANEL
ALK PHOS: 68 U/L (ref 38–126)
ALT: 14 U/L (ref 14–54)
ANION GAP: 7 (ref 5–15)
AST: 13 U/L — ABNORMAL LOW (ref 15–41)
Albumin: 3.3 g/dL — ABNORMAL LOW (ref 3.5–5.0)
BILIRUBIN TOTAL: 0.7 mg/dL (ref 0.3–1.2)
BUN: 15 mg/dL (ref 6–20)
CALCIUM: 8.7 mg/dL — AB (ref 8.9–10.3)
CO2: 28 mmol/L (ref 22–32)
Chloride: 98 mmol/L — ABNORMAL LOW (ref 101–111)
Creatinine, Ser: 0.83 mg/dL (ref 0.44–1.00)
GFR calc non Af Amer: >60 mL/min (ref >60)
Glucose, Bld: 99 mg/dL (ref 65–99)
Potassium: 3.9 mmol/L (ref 3.5–5.1)
Sodium: 133 mmol/L — ABNORMAL LOW (ref 135–145)
Total Protein: 6.8 g/dL (ref 6.5–8.1)

## 2016-10-11 LAB — PROTIME-INR
INR: 2.52
Prothrombin Time: 27.7 seconds — ABNORMAL HIGH (ref 11.4–15.2)

## 2016-10-11 MED ORDER — SODIUM CHLORIDE 0.9 % IV SOLN
20.0000 mg | Freq: Once | INTRAVENOUS | Status: AC
  Administered 2016-10-11: 20 mg via INTRAVENOUS
  Filled 2016-10-11: qty 2

## 2016-10-11 MED ORDER — PACLITAXEL CHEMO INJECTION 300 MG/50ML
45.0000 mg/m2 | Freq: Once | INTRAVENOUS | Status: AC
  Administered 2016-10-11: 72 mg via INTRAVENOUS
  Filled 2016-10-11: qty 12

## 2016-10-11 MED ORDER — PALONOSETRON HCL INJECTION 0.25 MG/5ML
0.2500 mg | Freq: Once | INTRAVENOUS | Status: AC
  Administered 2016-10-11: 0.25 mg via INTRAVENOUS
  Filled 2016-10-11: qty 5

## 2016-10-11 MED ORDER — HEPARIN SOD (PORK) LOCK FLUSH 100 UNIT/ML IV SOLN
500.0000 [IU] | Freq: Once | INTRAVENOUS | Status: AC | PRN
  Administered 2016-10-11: 500 [IU]
  Filled 2016-10-11: qty 5

## 2016-10-11 MED ORDER — SODIUM CHLORIDE 0.9 % IV SOLN
134.8000 mg | Freq: Once | INTRAVENOUS | Status: AC
  Administered 2016-10-11: 130 mg via INTRAVENOUS
  Filled 2016-10-11: qty 13

## 2016-10-11 MED ORDER — SODIUM CHLORIDE 0.9% FLUSH
10.0000 mL | INTRAVENOUS | Status: DC | PRN
  Administered 2016-10-11: 10 mL
  Filled 2016-10-11: qty 10

## 2016-10-11 MED ORDER — FAMOTIDINE IN NACL 20-0.9 MG/50ML-% IV SOLN
20.0000 mg | Freq: Once | INTRAVENOUS | Status: AC
  Administered 2016-10-11: 20 mg via INTRAVENOUS
  Filled 2016-10-11: qty 50

## 2016-10-11 MED ORDER — SODIUM CHLORIDE 0.9 % IV SOLN
Freq: Once | INTRAVENOUS | Status: AC
  Administered 2016-10-11: 11:00:00 via INTRAVENOUS

## 2016-10-11 MED ORDER — DIPHENHYDRAMINE HCL 50 MG/ML IJ SOLN
12.5000 mg | Freq: Once | INTRAMUSCULAR | Status: AC
  Administered 2016-10-11: 12.5 mg via INTRAVENOUS
  Filled 2016-10-11: qty 1

## 2016-10-11 NOTE — Progress Notes (Signed)
START ON PATHWAY REGIMEN - Non-Small Cell Lung     Administer weekly:     Paclitaxel      Carboplatin   **Always confirm dose/schedule in your pharmacy ordering system**    Patient Characteristics: Stage III - Unresectable, PS = 0, 1 AJCC T Category: T4 Current Disease Status: No Distant Mets or Local Recurrence AJCC N Category: N2 AJCC M Category: M0 AJCC 8 Stage Grouping: IIIB Performance Status: PS = 0, 1  Intent of Therapy: Curative Intent, Discussed with Patient

## 2016-10-11 NOTE — Patient Instructions (Signed)
[facility] Discharge Instructions for Patients Receiving Chemotherapy   Beginning January 23rd 2017 lab work for the Cancer Center will be done in the  Main lab at [place] on 1st floor. If you have a lab appointment with the Cancer Center please come in thru the  Main Entrance and check in at the main information desk   Today you received the following chemotherapy agents Taxol and Carboplatin. Follow-up as scheduled. Call clinic for any questions or concerns  To help prevent nausea and vomiting after your treatment, we encourage you to take your nausea medication.   If you develop nausea and vomiting, or diarrhea that is not controlled by your medication, call the clinic.  The clinic phone number is [phone]. Office hours are Monday-Friday 8:30am-5:00pm.  BELOW ARE SYMPTOMS THAT SHOULD BE REPORTED IMMEDIATELY:  *FEVER GREATER THAN 101.0 F  *CHILLS WITH OR WITHOUT FEVER  NAUSEA AND VOMITING THAT IS NOT CONTROLLED WITH YOUR NAUSEA MEDICATION  *UNUSUAL SHORTNESS OF BREATH  *UNUSUAL BRUISING OR BLEEDING  TENDERNESS IN MOUTH AND THROAT WITH OR WITHOUT PRESENCE OF ULCERS  *URINARY PROBLEMS  *BOWEL PROBLEMS  UNUSUAL RASH Items with * indicate a potential emergency and should be followed up as soon as possible. If you have an emergency after office hours please contact your primary care physician or go to the nearest emergency department.  Please call the clinic during office hours if you have any questions or concerns.   You may also contact the Patient Navigator at [phone] should you have any questions or need assistance in obtaining follow up care.      Resources For Cancer Patients and their Caregivers ? American Cancer Society: Can assist with transportation, wigs, general needs, runs Look Good Feel Better.        1-888-227-6333 ? Cancer Care: Provides financial assistance, online support groups, medication/co-pay assistance.   1-800-813-HOPE (4673) ? [facility] Assists [place] cancer patients and their families through emotional , educational and financial support.  336-427-4357 ? [place] DSS Where to apply for food stamps, Medicaid and utility assistance. 336-342-1394 ? RCATS: Transportation to medical appointments. 336-347-2287 ? Social Security Administration: May apply for disability if have a Stage IV cancer. 336-342-7796 1-800-772-1213 ? [place] Aging, Disability and Transit Services: Assists with nutrition, care and transit needs. [phone]         

## 2016-10-11 NOTE — Progress Notes (Signed)
Alexandria Chen tolerated chemo tx well without complaints or incident. Labs reviewed with Dr. Oliva Bustard prior to administering chemotherapy. VSS upon discharge. Pt discharged via wheelchair in satisfactory condition accompanied by her daughter

## 2016-10-11 NOTE — Patient Instructions (Addendum)
[facility] at [facility] Discharge Instructions  RECOMMENDATIONS MADE BY THE CONSULTANT AND ANY TEST RESULTS WILL BE SENT TO YOUR REFERRING PHYSICIAN.  You were seen today by Dr. Oliva Bustard. Return in 1 week for labs and treatment.    Thank you for choosing [place] at [facility] to provide your oncology and hematology care.  To afford each patient quality time with our provider, please arrive at least 15 minutes before your scheduled appointment time.    If you have a lab appointment with the [facility] please come in thru the  Main Entrance and check in at the main information desk  You need to re-schedule your appointment should you arrive 10 or more minutes late.  We strive to give you quality time with our providers, and arriving late affects you and other patients whose appointments are after yours.  Also, if you no show three or more times for appointments you may be dismissed from the clinic at the providers discretion.     Again, thank you for choosing [facility].  Our hope is that these requests will decrease the amount of time that you wait before being seen by our physicians.       _____________________________________________________________  Should you have questions after your visit to [facility], please contact our office at [phone] between the hours of 8:30 a.m. and 4:30 p.m.  Voicemails left after 4:30 p.m. will not be returned until the following business day.  For prescription refill requests, have your pharmacy contact our office.       Resources For Cancer Patients and their Caregivers ? American Cancer Society: Can assist with transportation, wigs, general needs, runs Look Good Feel Better.        218-484-6312 ? Cancer Care: Provides financial assistance, online support groups, medication/co-pay assistance.  1-800-813-HOPE 610 481 1244) ? [facility] Assists  [place] cancer patients and their families through emotional , educational and financial support.  225 596 5335 ? [place] DSS Where to apply for food stamps, Medicaid and utility assistance. (252) 759-6255 ? RCATS: Transportation to medical appointments. 909-702-8481 ? Social Security Administration: May apply for disability if have a Stage IV cancer. 512-076-3248 248-343-2972 ? [employer], Disability and Transit Services: Assists with nutrition, care and transit needs. [facility] Support Programs: @10RELATIVEDAYS @ > Cancer Support Group  2nd Tuesday of the month 1pm-2pm, Journey Room  > Creative Journey  3rd Tuesday of the month 1130am-1pm, Journey Room  > Look Good Feel Better  1st Wednesday of the month 10am-12 noon, Journey Room (Call [facility] to register [phone])

## 2016-10-11 NOTE — Progress Notes (Signed)
[facility] Hematology/Oncology Progress Note   Name: Alexandria Chen      MRN: [id number]    Location: Room/bed info not found  Date: 10/11/2016 Time:2:08 PM   REFERRING PHYSICIAN:  Evelina Dun, FNP (Primary Care Provider)  REASON FOR CONSULT:  4.9 x 3.3 cm RUL pulmonary mass.    DIAGNOSIS:  4.9 x 3.3 cm RUL pulmonary mass with concerns for direct mediastinal invasion and right hilar, right paratracheal lymph node enlargement without distant site of metastatic disease.   Squamous cell carcinoma of right lung (HCC)   06/25/2016 Imaging    CT chest- 1. 4.9 x 3.3 cm mass in the medial aspect of the right upper lobe which demonstrates evidence of potential direct mediastinal invasion, and pathologically enlarged right hilar and right paratracheal lymph nodes which suggest nodal metastatic disease. These findings are highly concerning for primary bronchogenic carcinoma and further evaluation with PET-CT is strongly recommended in the near future for further diagnostic and staging purposes. 2. There is also nodularity in the adrenal glands bilaterally, which could reflect additional sites of metastatic disease. Attention at time of forthcoming PET-CT is recommended. 3. Indeterminate lesion in the right lobe of the liver, favored to represent a cavernous hemangioma. Should this lesion prove to be hypermetabolic on PET imaging, further characterization with MRI of the abdomen with and without IV gadolinium could provide definitive characterization. 4. Aortic atherosclerosis, in addition to left main and 2 vessel coronary artery disease. Assessment for potential risk factor modification, dietary therapy or pharmacologic therapy may be warranted, if clinically indicated. 5. Mild diffuse bronchial wall thickening with mild centrilobular and paraseptal emphysema; imaging findings suggestive of underlying COPD. 6. Additional incidental findings, as above.      07/06/2016  Imaging    CT head wo contrast- 1. No acute finding. 2. Advanced chronic microvascular disease.      07/07/2016 PET scan    4.7 cm mass in the medial right upper lobe abutting/invading the mediastinum, suspicious for primary bronchogenic neoplasm.  Possible mild mediastinal/right hilar nodal metastases, equivocal.  No evidence of distant metastases.  3.6 x 2.3 cm subcapsular lesion in the inferior right liver is non FDG avid, likely reflecting a benign hemangioma when correlating with prior CT.      08/27/2016 Procedure    Needle core biopsy of the anterior right upper lobe mass by interventional radiology      08/30/2016 Pathology Results    Squamous cell carcinoma        HISTORY OF PRESENT ILLNESS:   Alexandria Chen is a 82 y.o. female with a medical history significant for history of alcoholism, anxiety/depression, essential hypertension, hyperlipidemia, and vitamin D deficiency, a-fib on Eliquis with plans to transition to warfarin on 08/16/2016 who is referred to the [facility] for right upper lung pulmonary mass measuring 4.9 cm in largest measured dimension.   Patient reports "feel pretty good, but not like it should."  She is unable to tell me exactly what that means.  Patient is accompanied by multiple family members and they all agree that their mother is in denial of recent findings on imaging.  Apparently the patient presented to her primary care provider with some altered mental status issues resulting in a workup including a chest x-ray.  Chest x-ray on 06/24/2016 demonstrated soft tissue mass in the right upper lobe and CT imaging was recommended.  This was followed by CT imaging on 06/25/2016 showing a large  4.9 x 3.3 cm mass in the medial aspect of the right upper lobe demonstrating evidence of potential direct mediastinal invasion pathologically enlarged right hilar and right paratracheal lymph nodes suggestive of nodal metastatic disease.  All findings are  concerning for primary bronchogenic carcinoma.  PET imaging was subsequently recommended.  PET scan was performed on 07/07/2016 demonstrating a hypermetabolic 4.8 cm mass in the medial right upper lobe abutting/invading the mediastinum suspicious for primary bronchogenic carcinoma with possible mild mediastinal/right hilar nodal metastases equivocal.  She was scheduled to see medical oncology immediately following PET scan but unfortunately the patient was in [place] and became dizzy resulting in an admission at [facility].  Therefore, her new patient appointment was deferred until discharge.  At [facility], she was found to be in atrial fibrillation.  She underwent cardioversion and anticoagulation therapy.  She is currently on Eliquis.  Plan is to transition her to Coumadin in April 2018.  Patient and family have been advised that she is to remain on anticoagulation.  Patient returns for continuing follow up. She is accompanied by her 4 daughters and 1 son. I personally reviewed and went over scans with the patient. She reports being currently fatigued and tired, but otherwise has no other complaints.  Pathology biopsy results from 08/27/2016 indicated squamous cell carcinoma of the lung.  Patient is here on second cycle of chemotherapy.  Started radiation therapy.  Tolerating treatment very well without any significant nausea vomiting diarrhea.  Review of Systems  Constitutional: Positive for malaise/fatigue.  HENT: Negative.   Eyes: Negative.   Respiratory: Positive for cough (chronic).   Cardiovascular: Negative.   Gastrointestinal: Negative.   Genitourinary: Negative.   Musculoskeletal: Negative.   Skin: Negative.   Endo/Heme/Allergies: Negative.   Psychiatric/Behavioral: Positive for substance abuse (tobacco abuse). The patient is nervous/anxious.     PAST MEDICAL HISTORY:   Past Medical History:  Diagnosis Date  . Alcohol abuse, in  remission   . Anxiety   . Atrial fibrillation ([place])   . Depression   . Dyspnea    walking distances   . Dysrhythmia   . History of bronchitis   . HTN (hypertension)   . Mass of right lung 07/13/2016  . Osteopenia   . Squamous cell carcinoma of right lung ([place]) 07/13/2016    ALLERGIES: No Known Allergies    MEDICATIONS: I have reviewed the patient's current medications.    Current Outpatient Prescriptions on File Prior to Visit  Medication Sig Dispense Refill  . amiodarone (PACERONE) 200 MG tablet Take 200 mg by mouth daily.     Marland Kitchen atorvastatin (LIPITOR) 40 MG tablet TAKE 1 TABLET (40 MG TOTAL) BY MOUTH DAILY. FOR HYPERLIPIDEMIA 90 tablet 1  . busPIRone (BUSPAR) 15 MG tablet Take 1 tablet (15 mg total) by mouth 2 (two) times daily. 180 tablet 2  . Calcium Carb-Cholecalciferol (CALCIUM 500+D3) 500-400 MG-UNIT TABS Take 1 tablet by mouth 2 (two) times daily.    Marland Kitchen CARBOPLATIN IV Inject into the vein. weekly    . dexamethasone (DECADRON) 4 MG tablet Take 2 tablets (8 mg total) by mouth daily. Start the day after chemotherapy for 2 days. 30 tablet 1  . DULoxetine (CYMBALTA) 60 MG capsule Take 1 capsule (60 mg total) by mouth daily. 30 capsule 2  . fish oil-omega-3 fatty acids 1000 MG capsule Take 3 capsules (3 g total) by mouth daily. For hyperlipidemia. (Patient taking differently: Take 1 g by mouth 3 (three)  times daily. For hyperlipidemia.) 30 capsule   . fluticasone (FLONASE) 50 MCG/ACT nasal spray Place 2 sprays into both nostrils 2 (two) times daily. Use as needed (Patient taking differently: Place 2 sprays into both nostrils 2 (two) times daily as needed for allergies. Use as needed) 16 g 6  . gabapentin (NEURONTIN) 100 MG capsule Take 1 capsule (100 mg total) by mouth at bedtime. 90 capsule 2  . lidocaine-prilocaine (EMLA) cream Apply to affected area once 30 g 3  . lisinopril (PRINIVIL,ZESTRIL) 5 MG tablet Take 5 mg by mouth.    Marland Kitchen LORazepam (ATIVAN) 0.5 MG tablet Take 1 tablet (0.5  mg total) by mouth every 6 (six) hours as needed (Nausea or vomiting). 30 tablet 0  . meclizine (ANTIVERT) 25 MG tablet Take 25 mg by mouth 3 (three) times daily as needed for dizziness.    . nitroGLYCERIN (NITROSTAT) 0.4 MG SL tablet Place 0.4 mg under the tongue.    Marland Kitchen PACLitaxel (TAXOL IV) Inject into the vein. Weekly    . prochlorperazine (COMPAZINE) 10 MG tablet Take 1 tablet (10 mg total) by mouth every 6 (six) hours as needed (Nausea or vomiting). 30 tablet 1  . warfarin (COUMADIN) 2.5 MG tablet Take 2.5 mg by mouth every evening.      No current facility-administered medications on file prior to visit.      PAST SURGICAL HISTORY Past Surgical History:  Procedure Laterality Date  . IR FLUORO GUIDE PORT INSERTION RIGHT  09/17/2016  . IR US GUIDE VASC ACCESS RIGHT  09/17/2016  . NO PAST SURGERIES      FAMILY HISTORY: Family History  Problem Relation Age of Onset  . Anxiety disorder Mother   . Dementia Mother   . Hypertension Mother   . Alcohol abuse Father   . Arthritis Father   . Alcohol abuse Brother   . ADD / ADHD Neg Hx   . Drug abuse Neg Hx   . Bipolar disorder Neg Hx   . Depression Neg Hx   . OCD Neg Hx   . Paranoid behavior Neg Hx   . Schizophrenia Neg Hx   . Seizures Neg Hx   . Sexual abuse Neg Hx   . Physical abuse Neg Hx    Mother deceased at the age of 56 from complications of pneumonia. Father passed away in his late 82s secondary to myocardial infarction. She has 1 sister She has 5 children.  4 daughters and 1 son.  All daughters are present today during discussion. She denies any family history of cancer.  SOCIAL HISTORY:  reports that she quit smoking about 2 months ago. Her smoking use included Cigarettes. She started smoking about 60 years ago. She has a 120.00 pack-year smoking history. She has never used smokeless tobacco. She reports that she does not drink alcohol or use drugs.  She quit drinking alcohol in 2014 with a past drinking history of 3  beers per day favoring [employer] and natural light.  She denies any illicit drug abuse.  She is retired and worked at a [employer] as a Training and development officer.  She admits that she is not religious.  She is a widower  5 years.  Her husband passed away secondary to complications of heart disease and diabetes.  Social History   Social History  . Marital status: Widowed    Spouse name: N/A  . Number of children: N/A  . Years of education: N/A   Social History Main Topics  .  Smoking status: Former Smoker    Packs/day: 2.00    Years: 60.00    Types: Cigarettes    Start date: 05/03/1956    Quit date: 07/15/2016  . Smokeless tobacco: Never Used  . Alcohol use No     Comment: Patient reports no alcohol use since 05/24/2016.  . Drug use: No  . Sexual activity: No   Other Topics Concern  . Not on file   Social History Narrative  . No narrative on file    PERFORMANCE STATUS: The patient's performance status is 1 - Symptomatic but completely ambulatory  PHYSICAL EXAM:  There were no vitals filed for this visit. There were no vitals filed for this visit.   Physical Exam  Constitutional: She is oriented to person, place, and time and well-developed, well-nourished, and in no distress.  HENT:  Head: Normocephalic and atraumatic.  Eyes: Conjunctivae and EOM are normal. Pupils are equal, round, and reactive to light.  Neck: Normal range of motion. Neck supple.  Cardiovascular: Normal rate, regular rhythm and normal heart sounds.   Pulmonary/Chest: Effort normal and breath sounds normal.  Abdominal: Soft. Bowel sounds are normal.  Musculoskeletal: Normal range of motion.  Neurological: She is alert and oriented to person, place, and time. Gait normal.  Skin: Skin is warm and dry.  Nursing note and vitals reviewed.    LABORATORY DATA:  Results for orders placed or performed in visit on 10/11/16 (from the past 48 hour(s))  CBC with Differential     Status: Abnormal   Collection Time:  10/11/16 10:04 AM  Result Value Ref Range   WBC 5.5 4.0 - 10.5 K/uL   RBC 3.95 3.87 - 5.11 MIL/uL   Hemoglobin 11.7 (L) 12.0 - 15.0 g/dL   HCT 35.4 (L) 36.0 - 46.0 %   MCV 89.6 78.0 - 100.0 fL   MCH 29.6 26.0 - 34.0 pg   MCHC 33.1 30.0 - 36.0 g/dL   RDW 14.2 11.5 - 15.5 %   Platelets 281 150 - 400 K/uL   Neutrophils Relative % 71 %   Neutro Abs 3.9 1.7 - 7.7 K/uL   Lymphocytes Relative 19 %   Lymphs Abs 1.1 0.7 - 4.0 K/uL   Monocytes Relative 9 %   Monocytes Absolute 0.5 0.1 - 1.0 K/uL   Eosinophils Relative 1 %   Eosinophils Absolute 0.1 0.0 - 0.7 K/uL   Basophils Relative 0 %   Basophils Absolute 0.0 0.0 - 0.1 K/uL  Comprehensive metabolic panel     Status: Abnormal   Collection Time: 10/11/16 10:04 AM  Result Value Ref Range   Sodium 133 (L) 135 - 145 mmol/L   Potassium 3.9 3.5 - 5.1 mmol/L   Chloride 98 (L) 101 - 111 mmol/L   CO2 28 22 - 32 mmol/L   Glucose, Bld 99 65 - 99 mg/dL   BUN 15 6 - 20 mg/dL   Creatinine, Ser 0.83 0.44 - 1.00 mg/dL   Calcium 8.7 (L) 8.9 - 10.3 mg/dL   Total Protein 6.8 6.5 - 8.1 g/dL   Albumin 3.3 (L) 3.5 - 5.0 g/dL   AST 13 (L) 15 - 41 U/L   ALT 14 14 - 54 U/L   Alkaline Phosphatase 68 38 - 126 U/L   Total Bilirubin 0.7 0.3 - 1.2 mg/dL   GFR calc non Af Amer >60 >60 mL/min   GFR calc Af Amer >60 >60 mL/min    Comment: (NOTE) The eGFR has been calculated using the  CKD EPI equation. This calculation has not been validated in all clinical situations. eGFR's persistently <60 mL/min signify possible Chronic Kidney Disease.    Anion gap 7 5 - 15  Protime-INR     Status: Abnormal   Collection Time: 10/11/16 10:04 AM  Result Value Ref Range   Prothrombin Time 27.7 (H) 11.4 - 15.2 seconds   INR 2.52       RADIOGRAPHY: CLINICAL DATA:  Initial treatment strategy for lung cancer.  EXAM: NUCLEAR MEDICINE PET SKULL BASE TO THIGH  TECHNIQUE: 6.3 mCi F-18 FDG was injected intravenously. Full-ring PET imaging was performed from the skull  base to thigh after the radiotracer. CT data was obtained and used for attenuation correction and anatomic localization.  FASTING BLOOD GLUCOSE:  Value: 92 mg/dl  COMPARISON:  CT chest dated 06/25/2016  FINDINGS: NECK  No suspicious cervical lymphadenopathy.  CHEST  4.7 cm mass in the medial right upper lobe abutting/invading the mediastinum (series 4/image 50), max SUV 17.5, suspicious for primary bronchogenic neoplasm.  Associated 9 mm lower right paratracheal node (series 4/image 50) with mild hypermetabolism, max SUV 3.4, indeterminate.  Mild hypermetabolism in the right hilar region, max SUV 4.0, poorly evaluated on unenhanced CT.  Underlying mild paraseptal emphysematous changes, upper lobe predominant.  ABDOMEN/PELVIS  No abnormal hypermetabolic activity within the liver, pancreas, adrenal glands, or spleen.  3.6 x 2.3 cm subcapsular hypodense lesion along the lateral aspect of segment 5 (series 4/image 95), non FDG avid, likely reflecting a benign hemangioma when correlating with prior CT.  No hypermetabolic lymph nodes in the abdomen or pelvis.  SKELETON  No focal hypermetabolic activity to suggest skeletal metastasis.  IMPRESSION: 4.7 cm mass in the medial right upper lobe abutting/invading the mediastinum, suspicious for primary bronchogenic neoplasm.  Possible mild mediastinal/right hilar nodal metastases, equivocal.  No evidence of distant metastases.  3.6 x 2.3 cm subcapsular lesion in the inferior right liver is non FDG avid, likely reflecting a benign hemangioma when correlating with prior CT.   Electronically Signed   By: Julian Hy M.D.   On: 07/07/2016 14:13   CLINICAL DATA:  Headache.  History of lung cancer  EXAM: CT HEAD WITHOUT CONTRAST  TECHNIQUE: Contiguous axial images were obtained from the base of the skull through the vertex without intravenous contrast.  COMPARISON:  05/02/2015 brain  MRI  FINDINGS: Brain: No evidence of acute infarction, hemorrhage, hydrocephalus, extra-axial collection or mass lesion/mass effect.  Advanced chronic microvascular disease with confluent white matter gliosis in the cerebral white matter and multiple remote lacunar infarcts in the deep gray nuclei and deep white matter tracts. Small remote left cerebellar infarct.  Vascular: Atherosclerosis.  No hyperdense vessel.  Skull: Normal. Negative for fracture or focal lesion.  Sinuses/Orbits: No acute finding.  IMPRESSION: 1. No acute finding. 2. Advanced chronic microvascular disease.   Electronically Signed   By: Monte Fantasia M.D.   On: 07/06/2016 07:22    CLINICAL DATA:  82 year old female with abnormal chest x-ray concerning for lung cancer. Followup study.  EXAM: CT CHEST WITH CONTRAST  TECHNIQUE: Multidetector CT imaging of the chest was performed during intravenous contrast administration.  CONTRAST:  29m ISOVUE-300 IOPAMIDOL (ISOVUE-300) INJECTION 61%  COMPARISON:  No prior chest CT.  Chest x-ray 06/24/2016.  FINDINGS: Cardiovascular: Heart size is mildly enlarged. There is no significant pericardial fluid, thickening or pericardial calcification. There is aortic atherosclerosis, as well as atherosclerosis of the great vessels of the mediastinum and the coronary arteries, including calcified atherosclerotic  plaque in the left main, left anterior descending and left circumflex coronary arteries.  Mediastinum/Nodes: Multiple enlarged right hilar and mediastinal lymph nodes. The largest of these include a 1.3 cm right hilar lymph node (image 59 of series 2) and a 1.4 cm low right paratracheal lymph node (image 54 of series 2). No contralateral mediastinal or left hilar lymphadenopathy. Esophagus is unremarkable in appearance. No axillary lymphadenopathy.  Lungs/Pleura: Large mass in the medial aspect of the right upper lobe measuring 4.9 x  3.3 cm (axial image 53 of series 2). This makes a broad contact with the pleura adjacent to the mediastinum, obscuring the intervening fat plane and slightly distorting the adjacent superior vena cava, which could indicate very early invasion (not yet definitive). In the surrounding lung parenchyma distal to the mass there are areas of septal thickening, which may reflect postobstructive changes or surrounding lymphangitic spread of disease. 4 mm pleural-based nodule associated with the minor fissure is nonspecific, but favored to represent a subpleural lymph node. No acute consolidative airspace disease. No pleural effusions. Patchy areas of peripheral predominant ground-glass attenuation are also noted throughout the lung bases bilaterally. Mild diffuse bronchial wall thickening with mild centrilobular and paraseptal emphysema.  Upper Abdomen: Incompletely visualized lesion in the right lobe of the liver which demonstrates some peripheral nodular enhancement, favored to represent a cavernous hemangioma (but not characterized) measuring at least 3.8 x 2.3 cm (image 146 of series 2). Incompletely visualized low-attenuation lesion measuring at least 2.1 x 3.3 cm in the anterior aspect of the interpolar region of the left kidney is not characterized, but likely a large cyst. Several other tiny subcentimeter low-attenuation lesions also noted in the kidneys bilaterally, also incompletely characterized due to their small size, but favored to represent tiny cysts. Multiple nodules associated with the adrenal glands bilaterally, incompletely characterized on today's contrast enhanced examination, largest of which measures up to 1.4 cm on the left. Aortic atherosclerosis.  Musculoskeletal: There are no aggressive appearing lytic or blastic lesions noted in the visualized portions of the skeleton.  IMPRESSION: 1. 4.9 x 3.3 cm mass in the medial aspect of the right upper lobe which  demonstrates evidence of potential direct mediastinal invasion, and pathologically enlarged right hilar and right paratracheal lymph nodes which suggest nodal metastatic disease. These findings are highly concerning for primary bronchogenic carcinoma and further evaluation with PET-CT is strongly recommended in the near future for further diagnostic and staging purposes. 2. There is also nodularity in the adrenal glands bilaterally, which could reflect additional sites of metastatic disease. Attention at time of forthcoming PET-CT is recommended. 3. Indeterminate lesion in the right lobe of the liver, favored to represent a cavernous hemangioma. Should this lesion prove to be hypermetabolic on PET imaging, further characterization with MRI of the abdomen with and without IV gadolinium could provide definitive characterization. 4. Aortic atherosclerosis, in addition to left main and 2 vessel coronary artery disease. Assessment for potential risk factor modification, dietary therapy or pharmacologic therapy may be warranted, if clinically indicated. 5. Mild diffuse bronchial wall thickening with mild centrilobular and paraseptal emphysema; imaging findings suggestive of underlying COPD. 6. Additional incidental findings, as above.   Electronically Signed   By: Vinnie Langton M.D.   On: 06/25/2016 15:43   CT HEAD WITHOUT CONTRAST 07/06/2016  IMPRESSION: 1. No acute finding. 2. Advanced chronic microvascular disease.  NUCLEAR MEDICINE PET SKULL BASE TO THIGH 07/07/2016  IMPRESSION: 4.7 cm mass in the medial right upper lobe abutting/invading the mediastinum, suspicious  for primary bronchogenic neoplasm.  Possible mild mediastinal/right hilar nodal metastases, equivocal.  No evidence of distant metastases.  3.6 x 2.3 cm subcapsular lesion in the inferior right liver is non FDG avid, likely reflecting a benign hemangioma when correlating with prior CT.    PORTABLE  CHEST 1 VIEW 08/27/2016   IMPRESSION: RIGHT upper lobe mass and suspected superior RIGHT hilar adenopathy without evidence of pneumothorax post biopsy.   PATHOLOGY:    ASSESSMENT/PLAN:  Stage IIIB (T4N2M0) squamous cell carcinoma of the right lung.  PLAN: Pathology biopsy results from 08/27/2016 indicated squamous cell carcinoma of the lung. I have reviewed path results in detail with the patient and her children.  I have discussed treatment plan with concurrent chemoRT to be followed by consolidative treatment with infimzi with the patient and her family.   I talked to the patient in detail about getting a chemo port placed by [facility] [place], stat order placed today.  She will receive weekly carboplatin + taxol in addition to radiation. I have educated the patient and her family in detail of the side effects of chemotherapy including but not limited to fatigue, altered taste, decreased appetite, decreased blood counts, neuropathy, and N/V, diarrhea.   I will tell our nurse navigator, Anderson Malta, to provide the patient with chemotherapy education.   The patient's family said they will discuss and let me know tomorrow at what location they would like to get her radiation treatment, then I will place a stat referral.  Tentatively plan to start her chemotherapy in 2 weeks from now on 09/20/16 however will make adjustments according to the start date of her radiation. I reccommended that she take her anti-nausea medication around the clock for the 2-3 days after treatment. I educated her on eating properly when she is on treatment and to stay within 5 pounds due to her treatment being weight based.   RTC in 2 weeks for follow up after patient's first cycle to assess for toxicities.     This note is electronically signed by: Forest Gleason, MD 10/11/2016 2:08 PM

## 2016-10-12 DIAGNOSIS — C3411 Malignant neoplasm of upper lobe, right bronchus or lung: Secondary | ICD-10-CM | POA: Diagnosis not present

## 2016-10-12 DIAGNOSIS — Z510 Encounter for antineoplastic radiation therapy: Secondary | ICD-10-CM | POA: Diagnosis not present

## 2016-10-14 DIAGNOSIS — Z510 Encounter for antineoplastic radiation therapy: Secondary | ICD-10-CM | POA: Diagnosis not present

## 2016-10-14 DIAGNOSIS — C3411 Malignant neoplasm of upper lobe, right bronchus or lung: Secondary | ICD-10-CM | POA: Diagnosis not present

## 2016-10-15 DIAGNOSIS — C3411 Malignant neoplasm of upper lobe, right bronchus or lung: Secondary | ICD-10-CM | POA: Diagnosis not present

## 2016-10-15 DIAGNOSIS — Z510 Encounter for antineoplastic radiation therapy: Secondary | ICD-10-CM | POA: Diagnosis not present

## 2016-10-18 ENCOUNTER — Encounter (HOSPITAL_BASED_OUTPATIENT_CLINIC_OR_DEPARTMENT_OTHER): Payer: Medicare Other

## 2016-10-18 ENCOUNTER — Encounter (HOSPITAL_BASED_OUTPATIENT_CLINIC_OR_DEPARTMENT_OTHER): Payer: Medicare Other | Admitting: Oncology

## 2016-10-18 VITALS — BP 140/72 | HR 80 | Temp 98.2°F | Resp 18 | Wt 135.0 lb

## 2016-10-18 DIAGNOSIS — Z510 Encounter for antineoplastic radiation therapy: Secondary | ICD-10-CM | POA: Diagnosis not present

## 2016-10-18 DIAGNOSIS — R918 Other nonspecific abnormal finding of lung field: Secondary | ICD-10-CM | POA: Diagnosis not present

## 2016-10-18 DIAGNOSIS — Z5181 Encounter for therapeutic drug level monitoring: Secondary | ICD-10-CM

## 2016-10-18 DIAGNOSIS — C3411 Malignant neoplasm of upper lobe, right bronchus or lung: Secondary | ICD-10-CM

## 2016-10-18 DIAGNOSIS — C3491 Malignant neoplasm of unspecified part of right bronchus or lung: Secondary | ICD-10-CM

## 2016-10-18 DIAGNOSIS — Z7901 Long term (current) use of anticoagulants: Principal | ICD-10-CM

## 2016-10-18 DIAGNOSIS — Z5111 Encounter for antineoplastic chemotherapy: Secondary | ICD-10-CM | POA: Diagnosis present

## 2016-10-18 LAB — CBC WITH DIFFERENTIAL/PLATELET
BASOS PCT: 0 %
Basophils Absolute: 0 10*3/uL (ref 0.0–0.1)
Eosinophils Absolute: 0.1 10*3/uL (ref 0.0–0.7)
Eosinophils Relative: 1 %
HEMATOCRIT: 33.8 % — AB (ref 36.0–46.0)
HEMOGLOBIN: 11.3 g/dL — AB (ref 12.0–15.0)
LYMPHS ABS: 0.9 10*3/uL (ref 0.7–4.0)
LYMPHS PCT: 19 %
MCH: 30 pg (ref 26.0–34.0)
MCHC: 33.4 g/dL (ref 30.0–36.0)
MCV: 89.7 fL (ref 78.0–100.0)
MONO ABS: 0.6 10*3/uL (ref 0.1–1.0)
MONOS PCT: 12 %
NEUTROS ABS: 3.4 10*3/uL (ref 1.7–7.7)
Neutrophils Relative %: 68 %
Platelets: 265 10*3/uL (ref 150–400)
RBC: 3.77 MIL/uL — ABNORMAL LOW (ref 3.87–5.11)
RDW: 14.5 % (ref 11.5–15.5)
WBC: 5 10*3/uL (ref 4.0–10.5)

## 2016-10-18 LAB — COMPREHENSIVE METABOLIC PANEL
ALK PHOS: 64 U/L (ref 38–126)
ALT: 12 U/L — ABNORMAL LOW (ref 14–54)
ANION GAP: 7 (ref 5–15)
AST: 13 U/L — ABNORMAL LOW (ref 15–41)
Albumin: 3.2 g/dL — ABNORMAL LOW (ref 3.5–5.0)
BILIRUBIN TOTAL: 0.5 mg/dL (ref 0.3–1.2)
BUN: 20 mg/dL (ref 6–20)
CALCIUM: 9 mg/dL (ref 8.9–10.3)
CO2: 27 mmol/L (ref 22–32)
Chloride: 101 mmol/L (ref 101–111)
Creatinine, Ser: 0.9 mg/dL (ref 0.44–1.00)
GFR, EST NON AFRICAN AMERICAN: 59 mL/min — AB (ref >60)
Glucose, Bld: 91 mg/dL (ref 65–99)
POTASSIUM: 4.1 mmol/L (ref 3.5–5.1)
Sodium: 135 mmol/L (ref 135–145)
TOTAL PROTEIN: 6.6 g/dL (ref 6.5–8.1)

## 2016-10-18 LAB — PROTIME-INR
INR: 1.79
PROTHROMBIN TIME: 21.1 s — AB (ref 11.4–15.2)

## 2016-10-18 MED ORDER — SODIUM CHLORIDE 0.9 % IV SOLN
Freq: Once | INTRAVENOUS | Status: AC
  Administered 2016-10-18: 11:00:00 via INTRAVENOUS

## 2016-10-18 MED ORDER — PACLITAXEL CHEMO INJECTION 300 MG/50ML
45.0000 mg/m2 | Freq: Once | INTRAVENOUS | Status: AC
  Administered 2016-10-18: 72 mg via INTRAVENOUS
  Filled 2016-10-18: qty 12

## 2016-10-18 MED ORDER — PALONOSETRON HCL INJECTION 0.25 MG/5ML
0.2500 mg | Freq: Once | INTRAVENOUS | Status: AC
  Administered 2016-10-18: 0.25 mg via INTRAVENOUS
  Filled 2016-10-18: qty 5

## 2016-10-18 MED ORDER — DIPHENHYDRAMINE HCL 50 MG/ML IJ SOLN
12.5000 mg | Freq: Once | INTRAMUSCULAR | Status: AC
  Administered 2016-10-18: 12.5 mg via INTRAVENOUS
  Filled 2016-10-18: qty 1

## 2016-10-18 MED ORDER — SODIUM CHLORIDE 0.9 % IV SOLN
134.8000 mg | Freq: Once | INTRAVENOUS | Status: AC
  Administered 2016-10-18: 130 mg via INTRAVENOUS
  Filled 2016-10-18: qty 13

## 2016-10-18 MED ORDER — SODIUM CHLORIDE 0.9 % IV SOLN
20.0000 mg | Freq: Once | INTRAVENOUS | Status: AC
  Administered 2016-10-18: 20 mg via INTRAVENOUS
  Filled 2016-10-18: qty 2

## 2016-10-18 MED ORDER — HEPARIN SOD (PORK) LOCK FLUSH 100 UNIT/ML IV SOLN
500.0000 [IU] | Freq: Once | INTRAVENOUS | Status: AC | PRN
  Administered 2016-10-18: 500 [IU]
  Filled 2016-10-18: qty 5

## 2016-10-18 MED ORDER — FAMOTIDINE IN NACL 20-0.9 MG/50ML-% IV SOLN
20.0000 mg | Freq: Once | INTRAVENOUS | Status: AC
  Administered 2016-10-18: 20 mg via INTRAVENOUS
  Filled 2016-10-18: qty 50

## 2016-10-18 MED ORDER — SODIUM CHLORIDE 0.9% FLUSH
10.0000 mL | INTRAVENOUS | Status: DC | PRN
Start: 2016-10-18 — End: 2016-10-18
  Administered 2016-10-18: 10 mL
  Filled 2016-10-18: qty 10

## 2016-10-18 NOTE — Progress Notes (Signed)
Patient tolerated infusion today. No complaints or reactions. Patient discharged with daughter via wheelchair in stable condition to home.

## 2016-10-18 NOTE — Patient Instructions (Signed)
[facility] Discharge Instructions for Patients Receiving Chemotherapy   Beginning January 23rd 2017 lab work for the [facility] will be done in the  Main lab at [facility] on 1st floor. If you have a lab appointment with the [facility] please come in thru the  Main Entrance and check in at the main information desk   Today you received the following chemotherapy agents: Carboplatin and Taxol Follow up as scheduled.  To help prevent nausea and vomiting after your treatment, we encourage you to take your nausea medication as prescribed.   If you develop nausea and vomiting, or diarrhea that is not controlled by your medication, call the clinic.  The clinic phone number is [phone]. Office hours are Monday-Friday 8:30am-5:00pm.  BELOW ARE SYMPTOMS THAT SHOULD BE REPORTED IMMEDIATELY:  *FEVER GREATER THAN 101.0 F  *CHILLS WITH OR WITHOUT FEVER  NAUSEA AND VOMITING THAT IS NOT CONTROLLED WITH YOUR NAUSEA MEDICATION  *UNUSUAL SHORTNESS OF BREATH  *UNUSUAL BRUISING OR BLEEDING  TENDERNESS IN MOUTH AND THROAT WITH OR WITHOUT PRESENCE OF ULCERS  *URINARY PROBLEMS  *BOWEL PROBLEMS  UNUSUAL RASH Items with * indicate a potential emergency and should be followed up as soon as possible. If you have an emergency after office hours please contact your primary care physician or go to the nearest emergency department.  Please call the clinic during office hours if you have any questions or concerns.   You may also contact the Patient Navigator at [phone] should you have any questions or need assistance in obtaining follow up care.      Resources For Cancer Patients and their Caregivers ? [employer]: Can assist with transportation, wigs, general needs, runs Look Good Feel Better.        (650)665-4353 ? Cancer Care: Provides financial assistance, online support groups, medication/co-pay assistance.  1-800-813-HOPE 702-517-0572) ? [employer] Assists [place] cancer patients and their families through emotional , educational and financial support.  (201)516-4814 ? [place] DSS Where to apply for food stamps, Medicaid and utility assistance. (669)110-1601 ? [facility]: Transportation to medical appointments. 3040967779 ? Social Security Administration: May apply for disability if have a Stage IV cancer. [phone] 607-685-9391 ? [employer] Disability and Transit Services: Assists with nutrition, care and transit needs. 315-043-8194

## 2016-10-18 NOTE — Progress Notes (Signed)
[facility] Hematology/Oncology Progress Note   Name: Alexandria Chen      MRN: [id number]    Location: Room/bed info not found  Date: 10/18/2016 Time:12:17 PM   REFERRING PHYSICIAN:  Evelina Dun, FNP (Primary Care Provider)  REASON FOR CONSULT:  4.9 x 3.3 cm RUL pulmonary mass.    DIAGNOSIS:  4.9 x 3.3 cm RUL pulmonary mass with concerns for direct mediastinal invasion and right hilar, right paratracheal lymph node enlargement without distant site of metastatic disease.   Squamous cell carcinoma of right lung (HCC)   06/25/2016 Imaging    CT chest- 1. 4.9 x 3.3 cm mass in the medial aspect of the right upper lobe which demonstrates evidence of potential direct mediastinal invasion, and pathologically enlarged right hilar and right paratracheal lymph nodes which suggest nodal metastatic disease. These findings are highly concerning for primary bronchogenic carcinoma and further evaluation with PET-CT is strongly recommended in the near future for further diagnostic and staging purposes. 2. There is also nodularity in the adrenal glands bilaterally, which could reflect additional sites of metastatic disease. Attention at time of forthcoming PET-CT is recommended. 3. Indeterminate lesion in the right lobe of the liver, favored to represent a cavernous hemangioma. Should this lesion prove to be hypermetabolic on PET imaging, further characterization with MRI of the abdomen with and without IV gadolinium could provide definitive characterization. 4. Aortic atherosclerosis, in addition to left main and 2 vessel coronary artery disease. Assessment for potential risk factor modification, dietary therapy or pharmacologic therapy may be warranted, if clinically indicated. 5. Mild diffuse bronchial wall thickening with mild centrilobular and paraseptal emphysema; imaging findings suggestive of underlying COPD. 6. Additional incidental findings, as above.      07/06/2016  Imaging    CT head wo contrast- 1. No acute finding. 2. Advanced chronic microvascular disease.      07/07/2016 PET scan    4.7 cm mass in the medial right upper lobe abutting/invading the mediastinum, suspicious for primary bronchogenic neoplasm.  Possible mild mediastinal/right hilar nodal metastases, equivocal.  No evidence of distant metastases.  3.6 x 2.3 cm subcapsular lesion in the inferior right liver is non FDG avid, likely reflecting a benign hemangioma when correlating with prior CT.      08/27/2016 Procedure    Needle core biopsy of the anterior right upper lobe mass by interventional radiology      08/30/2016 Pathology Results    Squamous cell carcinoma        HISTORY OF PRESENT ILLNESS:   Alexandria Chen is a 82 y.o. female with a medical history significant for history of alcoholism, anxiety/depression, essential hypertension, hyperlipidemia, and vitamin D deficiency, a-fib on Eliquis with plans to transition to warfarin on 08/16/2016 who is referred to the [facility] for right upper lung pulmonary mass measuring 4.9 cm in largest measured dimension.   Patient reports "feel pretty good, but not like it should."  She is unable to tell me exactly what that means.  Patient is accompanied by multiple family members and they all agree that their mother is in denial of recent findings on imaging.  Apparently the patient presented to her primary care provider with some altered mental status issues resulting in a workup including a chest x-ray.  Chest x-ray on 06/24/2016 demonstrated soft tissue mass in the right upper lobe and CT imaging was recommended.  This was followed by CT imaging on 06/25/2016 showing a large  4.9 x 3.3 cm mass in the medial aspect of the right upper lobe demonstrating evidence of potential direct mediastinal invasion pathologically enlarged right hilar and right paratracheal lymph nodes suggestive of nodal metastatic disease.  All findings are  concerning for primary bronchogenic carcinoma.  PET imaging was subsequently recommended.  PET scan was performed on 07/07/2016 demonstrating a hypermetabolic 4.8 cm mass in the medial right upper lobe abutting/invading the mediastinum suspicious for primary bronchogenic carcinoma with possible mild mediastinal/right hilar nodal metastases equivocal.  She was scheduled to see medical oncology immediately following PET scan but unfortunately the patient was in [place] and became dizzy resulting in an admission at [facility].  Therefore, her new patient appointment was deferred until discharge.  At [facility], she was found to be in atrial fibrillation.  She underwent cardioversion and anticoagulation therapy.  She is currently on Eliquis.  Plan is to transition her to Coumadin in April 2018.  Patient and family have been advised that she is to remain on anticoagulation.  INTERVAL HISTORY: Patient is here for cycle 3 of weekly chemotherapy with carboplatin and Taxol. She has been tolerating her treatments well except for fatigue. She denies any nausea, vomiting, diarrhea, neuropathy. She denies any dysphagia. Patient states that she's been having some constipation with hard stools.  Review of Systems  Constitutional: Positive for malaise/fatigue. Negative for chills and fever.  HENT: Negative.  Negative for hearing loss, sore throat and tinnitus.   Eyes: Negative.  Negative for blurred vision, photophobia and discharge.  Respiratory: Positive for cough. Negative for hemoptysis, shortness of breath and wheezing.   Cardiovascular: Negative.  Negative for chest pain, palpitations, orthopnea, claudication and leg swelling.  Gastrointestinal: Negative.  Negative for abdominal pain, constipation, diarrhea, melena, nausea and vomiting.  Genitourinary: Negative.  Negative for dysuria and hematuria.  Musculoskeletal: Negative.  Negative for back pain, joint pain  and myalgias.  Skin: Negative.  Negative for itching and rash.  Neurological: Negative for dizziness, weakness and headaches.  Endo/Heme/Allergies: Negative.  Negative for environmental allergies and polydipsia. Does not bruise/bleed easily.  Psychiatric/Behavioral: Positive for substance abuse (tobacco abuse). Negative for depression. The patient is not nervous/anxious and does not have insomnia.     PAST MEDICAL HISTORY:   Past Medical History:  Diagnosis Date  . Alcohol abuse, in remission   . Anxiety   . Atrial fibrillation ([place])   . Depression   . Dyspnea    walking distances   . Dysrhythmia   . History of bronchitis   . HTN (hypertension)   . Mass of right lung 07/13/2016  . Osteopenia   . Squamous cell carcinoma of right lung ([place]) 07/13/2016    ALLERGIES: No Known Allergies    MEDICATIONS: I have reviewed the patient's current medications.    Current Outpatient Prescriptions on File Prior to Visit  Medication Sig Dispense Refill  . amiodarone (PACERONE) 200 MG tablet Take 200 mg by mouth daily.     Marland Kitchen atorvastatin (LIPITOR) 40 MG tablet TAKE 1 TABLET (40 MG TOTAL) BY MOUTH DAILY. FOR HYPERLIPIDEMIA 90 tablet 1  . busPIRone (BUSPAR) 15 MG tablet Take 1 tablet (15 mg total) by mouth 2 (two) times daily. 180 tablet 2  . Calcium Carb-Cholecalciferol (CALCIUM 500+D3) 500-400 MG-UNIT TABS Take 1 tablet by mouth 2 (two) times daily.    Marland Kitchen CARBOPLATIN IV Inject into the vein. weekly    . dexamethasone (DECADRON) 4 MG tablet Take 2 tablets (8 mg  total) by mouth daily. Start the day after chemotherapy for 2 days. 30 tablet 1  . DULoxetine (CYMBALTA) 60 MG capsule Take 1 capsule (60 mg total) by mouth daily. 30 capsule 2  . fish oil-omega-3 fatty acids 1000 MG capsule Take 3 capsules (3 g total) by mouth daily. For hyperlipidemia. (Patient taking differently: Take 1 g by mouth 3 (three) times daily. For hyperlipidemia.) 30 capsule   . fluticasone (FLONASE) 50 MCG/ACT nasal spray Place  2 sprays into both nostrils 2 (two) times daily. Use as needed (Patient taking differently: Place 2 sprays into both nostrils 2 (two) times daily as needed for allergies. Use as needed) 16 g 6  . gabapentin (NEURONTIN) 100 MG capsule Take 1 capsule (100 mg total) by mouth at bedtime. 90 capsule 2  . lidocaine-prilocaine (EMLA) cream Apply to affected area once 30 g 3  . lisinopril (PRINIVIL,ZESTRIL) 5 MG tablet Take 5 mg by mouth.    Marland Kitchen LORazepam (ATIVAN) 0.5 MG tablet Take 1 tablet (0.5 mg total) by mouth every 6 (six) hours as needed (Nausea or vomiting). 30 tablet 0  . meclizine (ANTIVERT) 25 MG tablet Take 25 mg by mouth 3 (three) times daily as needed for dizziness.    . nitroGLYCERIN (NITROSTAT) 0.4 MG SL tablet Place 0.4 mg under the tongue.    Marland Kitchen PACLitaxel (TAXOL IV) Inject into the vein. Weekly    . prochlorperazine (COMPAZINE) 10 MG tablet Take 1 tablet (10 mg total) by mouth every 6 (six) hours as needed (Nausea or vomiting). 30 tablet 1  . warfarin (COUMADIN) 2.5 MG tablet Take 2.5 mg by mouth every evening.      Current Facility-Administered Medications on File Prior to Visit  Medication Dose Route Frequency Provider Last Rate Last Dose  . CARBOplatin (PARAPLATIN) 130 mg in sodium chloride 0.9 % 250 mL chemo infusion  130 mg Intravenous Once Twana First, MD      . heparin lock flush 100 unit/mL  500 Units Intracatheter Once PRN Twana First, MD      . PACLitaxel (TAXOL) 72 mg in dextrose 5 % 250 mL chemo infusion (</= 24m/m2)  45 mg/m2 (Treatment Plan Recorded) Intravenous Once ZTwana First MD 262 mL/hr at 10/18/16 1209 72 mg at 10/18/16 1209  . sodium chloride flush (NS) 0.9 % injection 10 mL  10 mL Intracatheter PRN ZTwana First MD   10 mL at 10/18/16 1039     PAST SURGICAL HISTORY Past Surgical History:  Procedure Laterality Date  . IR FLUORO GUIDE PORT INSERTION RIGHT  09/17/2016  . IR [place]IDE VASC ACCESS RIGHT  09/17/2016  . NO PAST SURGERIES      FAMILY  HISTORY: Family History  Problem Relation Age of Onset  . Anxiety disorder Mother   . Dementia Mother   . Hypertension Mother   . Alcohol abuse Father   . Arthritis Father   . Alcohol abuse Brother   . ADD / ADHD Neg Hx   . Drug abuse Neg Hx   . Bipolar disorder Neg Hx   . Depression Neg Hx   . OCD Neg Hx   . Paranoid behavior Neg Hx   . Schizophrenia Neg Hx   . Seizures Neg Hx   . Sexual abuse Neg Hx   . Physical abuse Neg Hx    Mother deceased at the age of 927from complications of pneumonia. Father passed away in his late 74ssecondary to myocardial infarction. She has 1 sister She has 5  children.  4 daughters and 1 son.  All daughters are present today during discussion. She denies any family history of cancer.  SOCIAL HISTORY:  reports that she quit smoking about 3 months ago. Her smoking use included Cigarettes. She started smoking about 60 years ago. She has a 120.00 pack-year smoking history. She has never used smokeless tobacco. She reports that she does not drink alcohol or use drugs.  She quit drinking alcohol in 2014 with a past drinking history of 3 beers per day favoring [employer] and natural light.  She denies any illicit drug abuse.  She is retired and worked at a [employer] as a Training and development officer.  She admits that she is not religious.  She is a widower  5 years.  Her husband passed away secondary to complications of heart disease and diabetes.  Social History   Social History  . Marital status: Widowed    Spouse name: N/A  . Number of children: N/A  . Years of education: N/A   Social History Main Topics  . Smoking status: Former Smoker    Packs/day: 2.00    Years: 60.00    Types: Cigarettes    Start date: 05/03/1956    Quit date: 07/15/2016  . Smokeless tobacco: Never Used  . Alcohol use No     Comment: Patient reports no alcohol use since 05/24/2016.  . Drug use: No  . Sexual activity: No   Other Topics Concern  . Not on file   Social History Narrative   . No narrative on file    PERFORMANCE STATUS: The patient's performance status is 1 - Symptomatic but completely ambulatory  PHYSICAL EXAM:  There were no vitals filed for this visit. There were no vitals filed for this visit.   Physical Exam  Constitutional: She is oriented to person, place, and time and well-developed, well-nourished, and in no distress. No distress.  HENT:  Head: Normocephalic and atraumatic.  Mouth/Throat: No oropharyngeal exudate.  Eyes: Conjunctivae and EOM are normal. Pupils are equal, round, and reactive to light. No scleral icterus.  Neck: Normal range of motion. Neck supple. No JVD present.  Cardiovascular: Normal rate, regular rhythm and normal heart sounds.  Exam reveals no gallop and no friction rub.   No murmur heard. Pulmonary/Chest: Effort normal and breath sounds normal. No respiratory distress. She has no wheezes. She has no rales.  Abdominal: Soft. Bowel sounds are normal. She exhibits no distension. There is no tenderness. There is no guarding.  Musculoskeletal: Normal range of motion. She exhibits no edema or tenderness.  Lymphadenopathy:    She has no cervical adenopathy.  Neurological: She is alert and oriented to person, place, and time. No cranial nerve deficit. Gait normal.  Skin: Skin is warm and dry. No rash noted. No erythema. No pallor.  Psychiatric: Affect and judgment normal.  Nursing note and vitals reviewed.    LABORATORY DATA:  Results for orders placed or performed in visit on 10/18/16 (from the past 48 hour(s))  Protime-INR     Status: Abnormal   Collection Time: 10/18/16 10:38 AM  Result Value Ref Range   Prothrombin Time 21.1 (H) 11.4 - 15.2 seconds   INR 1.79   CBC with Differential     Status: Abnormal   Collection Time: 10/18/16 10:38 AM  Result Value Ref Range   WBC 5.0 4.0 - 10.5 K/uL   RBC 3.77 (L) 3.87 - 5.11 MIL/uL   Hemoglobin 11.3 (L) 12.0 - 15.0 g/dL  HCT 33.8 (L) 36.0 - 46.0 %   MCV 89.7 78.0 - 100.0  fL   MCH 30.0 26.0 - 34.0 pg   MCHC 33.4 30.0 - 36.0 g/dL   RDW 14.5 11.5 - 15.5 %   Platelets 265 150 - 400 K/uL   Neutrophils Relative % 68 %   Neutro Abs 3.4 1.7 - 7.7 K/uL   Lymphocytes Relative 19 %   Lymphs Abs 0.9 0.7 - 4.0 K/uL   Monocytes Relative 12 %   Monocytes Absolute 0.6 0.1 - 1.0 K/uL   Eosinophils Relative 1 %   Eosinophils Absolute 0.1 0.0 - 0.7 K/uL   Basophils Relative 0 %   Basophils Absolute 0.0 0.0 - 0.1 K/uL  Comprehensive metabolic panel     Status: Abnormal   Collection Time: 10/18/16 10:38 AM  Result Value Ref Range   Sodium 135 135 - 145 mmol/L   Potassium 4.1 3.5 - 5.1 mmol/L   Chloride 101 101 - 111 mmol/L   CO2 27 22 - 32 mmol/L   Glucose, Bld 91 65 - 99 mg/dL   BUN 20 6 - 20 mg/dL   Creatinine, Ser 0.90 0.44 - 1.00 mg/dL   Calcium 9.0 8.9 - 10.3 mg/dL   Total Protein 6.6 6.5 - 8.1 g/dL   Albumin 3.2 (L) 3.5 - 5.0 g/dL   AST 13 (L) 15 - 41 U/L   ALT 12 (L) 14 - 54 U/L   Alkaline Phosphatase 64 38 - 126 U/L   Total Bilirubin 0.5 0.3 - 1.2 mg/dL   GFR calc non Af Amer 59 (L) >60 mL/min   GFR calc Af Amer >60 >60 mL/min    Comment: (NOTE) The eGFR has been calculated using the CKD EPI equation. This calculation has not been validated in all clinical situations. eGFR's persistently <60 mL/min signify possible Chronic Kidney Disease.    Anion gap 7 5 - 15      RADIOGRAPHY: CLINICAL DATA:  Initial treatment strategy for lung cancer.  EXAM: NUCLEAR MEDICINE PET SKULL BASE TO THIGH  TECHNIQUE: 6.3 mCi F-18 FDG was injected intravenously. Full-ring PET imaging was performed from the skull base to thigh after the radiotracer. CT data was obtained and used for attenuation correction and anatomic localization.  FASTING BLOOD GLUCOSE:  Value: 92 mg/dl  COMPARISON:  CT chest dated 06/25/2016  FINDINGS: NECK  No suspicious cervical lymphadenopathy.  CHEST  4.7 cm mass in the medial right upper lobe abutting/invading  the mediastinum (series 4/image 50), max SUV 17.5, suspicious for primary bronchogenic neoplasm.  Associated 9 mm lower right paratracheal node (series 4/image 50) with mild hypermetabolism, max SUV 3.4, indeterminate.  Mild hypermetabolism in the right hilar region, max SUV 4.0, poorly evaluated on unenhanced CT.  Underlying mild paraseptal emphysematous changes, upper lobe predominant.  ABDOMEN/PELVIS  No abnormal hypermetabolic activity within the liver, pancreas, adrenal glands, or spleen.  3.6 x 2.3 cm subcapsular hypodense lesion along the lateral aspect of segment 5 (series 4/image 95), non FDG avid, likely reflecting a benign hemangioma when correlating with prior CT.  No hypermetabolic lymph nodes in the abdomen or pelvis.  SKELETON  No focal hypermetabolic activity to suggest skeletal metastasis.  IMPRESSION: 4.7 cm mass in the medial right upper lobe abutting/invading the mediastinum, suspicious for primary bronchogenic neoplasm.  Possible mild mediastinal/right hilar nodal metastases, equivocal.  No evidence of distant metastases.  3.6 x 2.3 cm subcapsular lesion in the inferior right liver is non FDG avid, likely reflecting a  benign hemangioma when correlating with prior CT.   Electronically Signed   By: Julian Hy M.D.   On: 07/07/2016 14:13   CLINICAL DATA:  Headache.  History of lung cancer  EXAM: CT HEAD WITHOUT CONTRAST  TECHNIQUE: Contiguous axial images were obtained from the base of the skull through the vertex without intravenous contrast.  COMPARISON:  05/02/2015 brain MRI  FINDINGS: Brain: No evidence of acute infarction, hemorrhage, hydrocephalus, extra-axial collection or mass lesion/mass effect.  Advanced chronic microvascular disease with confluent white matter gliosis in the cerebral white matter and multiple remote lacunar infarcts in the deep gray nuclei and deep white matter tracts. Small remote  left cerebellar infarct.  Vascular: Atherosclerosis.  No hyperdense vessel.  Skull: Normal. Negative for fracture or focal lesion.  Sinuses/Orbits: No acute finding.  IMPRESSION: 1. No acute finding. 2. Advanced chronic microvascular disease.   Electronically Signed   By: Monte Fantasia M.D.   On: 07/06/2016 07:22    CLINICAL DATA:  82 year old female with abnormal chest x-ray concerning for lung cancer. Followup study.  EXAM: CT CHEST WITH CONTRAST  TECHNIQUE: Multidetector CT imaging of the chest was performed during intravenous contrast administration.  CONTRAST:  38m ISOVUE-300 IOPAMIDOL (ISOVUE-300) INJECTION 61%  COMPARISON:  No prior chest CT.  Chest x-ray 06/24/2016.  FINDINGS: Cardiovascular: Heart size is mildly enlarged. There is no significant pericardial fluid, thickening or pericardial calcification. There is aortic atherosclerosis, as well as atherosclerosis of the great vessels of the mediastinum and the coronary arteries, including calcified atherosclerotic plaque in the left main, left anterior descending and left circumflex coronary arteries.  Mediastinum/Nodes: Multiple enlarged right hilar and mediastinal lymph nodes. The largest of these include a 1.3 cm right hilar lymph node (image 59 of series 2) and a 1.4 cm low right paratracheal lymph node (image 54 of series 2). No contralateral mediastinal or left hilar lymphadenopathy. Esophagus is unremarkable in appearance. No axillary lymphadenopathy.  Lungs/Pleura: Large mass in the medial aspect of the right upper lobe measuring 4.9 x 3.3 cm (axial image 53 of series 2). This makes a broad contact with the pleura adjacent to the mediastinum, obscuring the intervening fat plane and slightly distorting the adjacent superior vena cava, which could indicate very early invasion (not yet definitive). In the surrounding lung parenchyma distal to the mass there are areas of septal  thickening, which may reflect postobstructive changes or surrounding lymphangitic spread of disease. 4 mm pleural-based nodule associated with the minor fissure is nonspecific, but favored to represent a subpleural lymph node. No acute consolidative airspace disease. No pleural effusions. Patchy areas of peripheral predominant ground-glass attenuation are also noted throughout the lung bases bilaterally. Mild diffuse bronchial wall thickening with mild centrilobular and paraseptal emphysema.  Upper Abdomen: Incompletely visualized lesion in the right lobe of the liver which demonstrates some peripheral nodular enhancement, favored to represent a cavernous hemangioma (but not characterized) measuring at least 3.8 x 2.3 cm (image 146 of series 2). Incompletely visualized low-attenuation lesion measuring at least 2.1 x 3.3 cm in the anterior aspect of the interpolar region of the left kidney is not characterized, but likely a large cyst. Several other tiny subcentimeter low-attenuation lesions also noted in the kidneys bilaterally, also incompletely characterized due to their small size, but favored to represent tiny cysts. Multiple nodules associated with the adrenal glands bilaterally, incompletely characterized on today's contrast enhanced examination, largest of which measures up to 1.4 cm on the left. Aortic atherosclerosis.  Musculoskeletal: There are no  aggressive appearing lytic or blastic lesions noted in the visualized portions of the skeleton.  IMPRESSION: 1. 4.9 x 3.3 cm mass in the medial aspect of the right upper lobe which demonstrates evidence of potential direct mediastinal invasion, and pathologically enlarged right hilar and right paratracheal lymph nodes which suggest nodal metastatic disease. These findings are highly concerning for primary bronchogenic carcinoma and further evaluation with PET-CT is strongly recommended in the near future for further diagnostic  and staging purposes. 2. There is also nodularity in the adrenal glands bilaterally, which could reflect additional sites of metastatic disease. Attention at time of forthcoming PET-CT is recommended. 3. Indeterminate lesion in the right lobe of the liver, favored to represent a cavernous hemangioma. Should this lesion prove to be hypermetabolic on PET imaging, further characterization with MRI of the abdomen with and without IV gadolinium could provide definitive characterization. 4. Aortic atherosclerosis, in addition to left main and 2 vessel coronary artery disease. Assessment for potential risk factor modification, dietary therapy or pharmacologic therapy may be warranted, if clinically indicated. 5. Mild diffuse bronchial wall thickening with mild centrilobular and paraseptal emphysema; imaging findings suggestive of underlying COPD. 6. Additional incidental findings, as above.   Electronically Signed   By: Vinnie Langton M.D.   On: 06/25/2016 15:43   CT HEAD WITHOUT CONTRAST 07/06/2016  IMPRESSION: 1. No acute finding. 2. Advanced chronic microvascular disease.  NUCLEAR MEDICINE PET SKULL BASE TO THIGH 07/07/2016  IMPRESSION: 4.7 cm mass in the medial right upper lobe abutting/invading the mediastinum, suspicious for primary bronchogenic neoplasm.  Possible mild mediastinal/right hilar nodal metastases, equivocal.  No evidence of distant metastases.  3.6 x 2.3 cm subcapsular lesion in the inferior right liver is non FDG avid, likely reflecting a benign hemangioma when correlating with prior CT.    PORTABLE CHEST 1 VIEW 08/27/2016   IMPRESSION: RIGHT upper lobe mass and suspected superior RIGHT hilar adenopathy without evidence of pneumothorax post biopsy.   PATHOLOGY:    ASSESSMENT/PLAN:  Stage IIIB (T4N2M0) squamous cell carcinoma of the right lung. Currently getting concurrent chemoRT. RT started on 10/04/16. Cycle 1 of chemo with carbo/taxol  started on 09/20/16.   PLAN: Tolerating chemotherapy well. Continue weekly carbo/taxol as scheduled. Will plan for total of 7 cycles of chemo. Her last RT treatment is scheduled for 11/16/16. RTC in 2 weeks for follow up.    All questions were answered. The patient knows to call the clinic with any problems, questions or concerns, a total of 45 min was spent at bedside with the patient and her family.    This note is electronically signed by: Twana First, MD 10/18/2016 12:17 PM

## 2016-10-19 ENCOUNTER — Ambulatory Visit (INDEPENDENT_AMBULATORY_CARE_PROVIDER_SITE_OTHER): Payer: Medicare Other | Admitting: Pharmacist

## 2016-10-19 ENCOUNTER — Telehealth: Payer: Self-pay | Admitting: Family

## 2016-10-19 DIAGNOSIS — I4891 Unspecified atrial fibrillation: Secondary | ICD-10-CM

## 2016-10-19 DIAGNOSIS — Z510 Encounter for antineoplastic radiation therapy: Secondary | ICD-10-CM | POA: Diagnosis not present

## 2016-10-19 DIAGNOSIS — C3411 Malignant neoplasm of upper lobe, right bronchus or lung: Secondary | ICD-10-CM | POA: Diagnosis not present

## 2016-10-19 NOTE — Telephone Encounter (Signed)
Patient's daughter notified of change in warfarin dose

## 2016-10-20 DIAGNOSIS — Z510 Encounter for antineoplastic radiation therapy: Secondary | ICD-10-CM | POA: Diagnosis not present

## 2016-10-20 DIAGNOSIS — C3411 Malignant neoplasm of upper lobe, right bronchus or lung: Secondary | ICD-10-CM | POA: Diagnosis not present

## 2016-10-21 DIAGNOSIS — Z510 Encounter for antineoplastic radiation therapy: Secondary | ICD-10-CM | POA: Diagnosis not present

## 2016-10-21 DIAGNOSIS — C3411 Malignant neoplasm of upper lobe, right bronchus or lung: Secondary | ICD-10-CM | POA: Diagnosis not present

## 2016-10-22 DIAGNOSIS — C3411 Malignant neoplasm of upper lobe, right bronchus or lung: Secondary | ICD-10-CM | POA: Diagnosis not present

## 2016-10-22 DIAGNOSIS — Z510 Encounter for antineoplastic radiation therapy: Secondary | ICD-10-CM | POA: Diagnosis not present

## 2016-10-25 ENCOUNTER — Encounter (HOSPITAL_BASED_OUTPATIENT_CLINIC_OR_DEPARTMENT_OTHER): Payer: Medicare Other

## 2016-10-25 ENCOUNTER — Encounter (HOSPITAL_COMMUNITY): Payer: Self-pay

## 2016-10-25 ENCOUNTER — Ambulatory Visit (HOSPITAL_COMMUNITY): Payer: Self-pay

## 2016-10-25 VITALS — BP 152/70 | HR 75 | Temp 98.6°F | Resp 18 | Wt 136.8 lb

## 2016-10-25 DIAGNOSIS — C3491 Malignant neoplasm of unspecified part of right bronchus or lung: Secondary | ICD-10-CM

## 2016-10-25 DIAGNOSIS — R918 Other nonspecific abnormal finding of lung field: Secondary | ICD-10-CM | POA: Diagnosis not present

## 2016-10-25 DIAGNOSIS — Z5111 Encounter for antineoplastic chemotherapy: Secondary | ICD-10-CM | POA: Diagnosis present

## 2016-10-25 DIAGNOSIS — C3411 Malignant neoplasm of upper lobe, right bronchus or lung: Secondary | ICD-10-CM | POA: Diagnosis not present

## 2016-10-25 DIAGNOSIS — Z510 Encounter for antineoplastic radiation therapy: Secondary | ICD-10-CM | POA: Diagnosis not present

## 2016-10-25 LAB — COMPREHENSIVE METABOLIC PANEL
ALK PHOS: 68 U/L (ref 38–126)
ALT: 12 U/L — AB (ref 14–54)
AST: 13 U/L — ABNORMAL LOW (ref 15–41)
Albumin: 3 g/dL — ABNORMAL LOW (ref 3.5–5.0)
Anion gap: 5 (ref 5–15)
BILIRUBIN TOTAL: 0.3 mg/dL (ref 0.3–1.2)
BUN: 18 mg/dL (ref 6–20)
CO2: 26 mmol/L (ref 22–32)
CREATININE: 0.78 mg/dL (ref 0.44–1.00)
Calcium: 8.8 mg/dL — ABNORMAL LOW (ref 8.9–10.3)
Chloride: 104 mmol/L (ref 101–111)
GFR calc Af Amer: >60 mL/min (ref >60)
Glucose, Bld: 109 mg/dL — ABNORMAL HIGH (ref 65–99)
Potassium: 4 mmol/L (ref 3.5–5.1)
Sodium: 135 mmol/L (ref 135–145)
TOTAL PROTEIN: 6.6 g/dL (ref 6.5–8.1)

## 2016-10-25 LAB — CBC WITH DIFFERENTIAL/PLATELET
Basophils Absolute: 0 K/uL (ref 0.0–0.1)
Basophils Relative: 0 %
Eosinophils Absolute: 0 K/uL (ref 0.0–0.7)
Eosinophils Relative: 0 %
HCT: 33.9 % — ABNORMAL LOW (ref 36.0–46.0)
Hemoglobin: 11.2 g/dL — ABNORMAL LOW (ref 12.0–15.0)
Lymphocytes Relative: 11 %
Lymphs Abs: 0.4 K/uL — ABNORMAL LOW (ref 0.7–4.0)
MCH: 29.6 pg (ref 26.0–34.0)
MCHC: 33 g/dL (ref 30.0–36.0)
MCV: 89.7 fL (ref 78.0–100.0)
Monocytes Absolute: 0.5 K/uL (ref 0.1–1.0)
Monocytes Relative: 13 %
Neutro Abs: 2.7 K/uL (ref 1.7–7.7)
Neutrophils Relative %: 76 %
Platelets: 272 K/uL (ref 150–400)
RBC: 3.78 MIL/uL — ABNORMAL LOW (ref 3.87–5.11)
RDW: 14.6 % (ref 11.5–15.5)
WBC: 3.6 K/uL — ABNORMAL LOW (ref 4.0–10.5)

## 2016-10-25 MED ORDER — SODIUM CHLORIDE 0.9 % IV SOLN
134.8000 mg | Freq: Once | INTRAVENOUS | Status: AC
  Administered 2016-10-25: 130 mg via INTRAVENOUS
  Filled 2016-10-25: qty 13

## 2016-10-25 MED ORDER — DIPHENHYDRAMINE HCL 50 MG/ML IJ SOLN
12.5000 mg | Freq: Once | INTRAMUSCULAR | Status: AC
  Administered 2016-10-25: 12.5 mg via INTRAVENOUS
  Filled 2016-10-25: qty 1

## 2016-10-25 MED ORDER — HEPARIN SOD (PORK) LOCK FLUSH 100 UNIT/ML IV SOLN
500.0000 [IU] | Freq: Once | INTRAVENOUS | Status: AC | PRN
  Administered 2016-10-25: 500 [IU]
  Filled 2016-10-25 (×2): qty 5

## 2016-10-25 MED ORDER — PACLITAXEL CHEMO INJECTION 300 MG/50ML
45.0000 mg/m2 | Freq: Once | INTRAVENOUS | Status: AC
  Administered 2016-10-25: 72 mg via INTRAVENOUS
  Filled 2016-10-25: qty 12

## 2016-10-25 MED ORDER — SODIUM CHLORIDE 0.9 % IV SOLN
Freq: Once | INTRAVENOUS | Status: AC
  Administered 2016-10-25: 11:00:00 via INTRAVENOUS

## 2016-10-25 MED ORDER — PALONOSETRON HCL INJECTION 0.25 MG/5ML
0.2500 mg | Freq: Once | INTRAVENOUS | Status: AC
  Administered 2016-10-25: 0.25 mg via INTRAVENOUS
  Filled 2016-10-25: qty 5

## 2016-10-25 MED ORDER — DEXAMETHASONE SODIUM PHOSPHATE 100 MG/10ML IJ SOLN
20.0000 mg | Freq: Once | INTRAMUSCULAR | Status: AC
  Administered 2016-10-25: 20 mg via INTRAVENOUS
  Filled 2016-10-25: qty 2

## 2016-10-25 MED ORDER — FAMOTIDINE IN NACL 20-0.9 MG/50ML-% IV SOLN
20.0000 mg | Freq: Once | INTRAVENOUS | Status: AC
  Administered 2016-10-25: 20 mg via INTRAVENOUS
  Filled 2016-10-25: qty 50

## 2016-10-25 NOTE — Progress Notes (Signed)
Tolerated infusions w/o adverse reaction.  Alert, in no distress.  VSS.  Discharged via wheelchair in c/o daughter.

## 2016-10-26 DIAGNOSIS — Z510 Encounter for antineoplastic radiation therapy: Secondary | ICD-10-CM | POA: Diagnosis not present

## 2016-10-26 DIAGNOSIS — C3411 Malignant neoplasm of upper lobe, right bronchus or lung: Secondary | ICD-10-CM | POA: Diagnosis not present

## 2016-10-27 DIAGNOSIS — Z510 Encounter for antineoplastic radiation therapy: Secondary | ICD-10-CM | POA: Diagnosis not present

## 2016-10-27 DIAGNOSIS — C3411 Malignant neoplasm of upper lobe, right bronchus or lung: Secondary | ICD-10-CM | POA: Diagnosis not present

## 2016-10-28 ENCOUNTER — Telehealth: Payer: Self-pay | Admitting: Family

## 2016-10-28 ENCOUNTER — Other Ambulatory Visit (HOSPITAL_COMMUNITY): Payer: Self-pay

## 2016-10-28 DIAGNOSIS — I4891 Unspecified atrial fibrillation: Secondary | ICD-10-CM

## 2016-10-28 DIAGNOSIS — C3411 Malignant neoplasm of upper lobe, right bronchus or lung: Secondary | ICD-10-CM | POA: Diagnosis not present

## 2016-10-28 DIAGNOSIS — Z510 Encounter for antineoplastic radiation therapy: Secondary | ICD-10-CM | POA: Diagnosis not present

## 2016-10-28 NOTE — Telephone Encounter (Signed)
Ordered INR through [facility] [place] [facility] - recommended check every 2 week.

## 2016-10-28 NOTE — Telephone Encounter (Signed)
Pt's daughter notified pt does not need to come in [name] she can wait until Monday to have INR rechecked

## 2016-10-29 DIAGNOSIS — C3411 Malignant neoplasm of upper lobe, right bronchus or lung: Secondary | ICD-10-CM | POA: Diagnosis not present

## 2016-10-29 DIAGNOSIS — Z510 Encounter for antineoplastic radiation therapy: Secondary | ICD-10-CM | POA: Diagnosis not present

## 2016-11-01 ENCOUNTER — Encounter (HOSPITAL_BASED_OUTPATIENT_CLINIC_OR_DEPARTMENT_OTHER): Payer: Medicare Other | Admitting: Oncology

## 2016-11-01 ENCOUNTER — Encounter (HOSPITAL_COMMUNITY): Payer: Self-pay

## 2016-11-01 ENCOUNTER — Encounter (HOSPITAL_COMMUNITY): Payer: Medicare Other | Attending: Oncology

## 2016-11-01 VITALS — BP 128/70 | HR 82 | Temp 98.9°F | Resp 18 | Wt 133.1 lb

## 2016-11-01 DIAGNOSIS — C3411 Malignant neoplasm of upper lobe, right bronchus or lung: Secondary | ICD-10-CM | POA: Diagnosis not present

## 2016-11-01 DIAGNOSIS — C3491 Malignant neoplasm of unspecified part of right bronchus or lung: Secondary | ICD-10-CM

## 2016-11-01 DIAGNOSIS — R590 Localized enlarged lymph nodes: Secondary | ICD-10-CM | POA: Diagnosis not present

## 2016-11-01 DIAGNOSIS — R530 Neoplastic (malignant) related fatigue: Secondary | ICD-10-CM

## 2016-11-01 DIAGNOSIS — I4891 Unspecified atrial fibrillation: Secondary | ICD-10-CM

## 2016-11-01 DIAGNOSIS — Z5111 Encounter for antineoplastic chemotherapy: Secondary | ICD-10-CM

## 2016-11-01 DIAGNOSIS — R918 Other nonspecific abnormal finding of lung field: Secondary | ICD-10-CM | POA: Insufficient documentation

## 2016-11-01 DIAGNOSIS — K769 Liver disease, unspecified: Secondary | ICD-10-CM | POA: Diagnosis not present

## 2016-11-01 DIAGNOSIS — J449 Chronic obstructive pulmonary disease, unspecified: Secondary | ICD-10-CM | POA: Diagnosis not present

## 2016-11-01 DIAGNOSIS — K5900 Constipation, unspecified: Secondary | ICD-10-CM | POA: Diagnosis not present

## 2016-11-01 DIAGNOSIS — I10 Essential (primary) hypertension: Secondary | ICD-10-CM | POA: Diagnosis not present

## 2016-11-01 LAB — CBC WITH DIFFERENTIAL/PLATELET
Basophils Absolute: 0 10*3/uL (ref 0.0–0.1)
Basophils Relative: 0 %
EOS PCT: 0 %
Eosinophils Absolute: 0 10*3/uL (ref 0.0–0.7)
HCT: 32.8 % — ABNORMAL LOW (ref 36.0–46.0)
HEMOGLOBIN: 11 g/dL — AB (ref 12.0–15.0)
LYMPHS ABS: 0.7 10*3/uL (ref 0.7–4.0)
LYMPHS PCT: 15 %
MCH: 29.8 pg (ref 26.0–34.0)
MCHC: 33.5 g/dL (ref 30.0–36.0)
MCV: 88.9 fL (ref 78.0–100.0)
Monocytes Absolute: 0.8 10*3/uL (ref 0.1–1.0)
Monocytes Relative: 17 %
NEUTROS ABS: 3.2 10*3/uL (ref 1.7–7.7)
NEUTROS PCT: 68 %
Platelets: 263 10*3/uL (ref 150–400)
RBC: 3.69 MIL/uL — AB (ref 3.87–5.11)
RDW: 14.9 % (ref 11.5–15.5)
WBC: 4.7 10*3/uL (ref 4.0–10.5)

## 2016-11-01 LAB — COMPREHENSIVE METABOLIC PANEL
ALK PHOS: 58 U/L (ref 38–126)
ALT: 14 U/L (ref 14–54)
AST: 13 U/L — AB (ref 15–41)
Albumin: 2.9 g/dL — ABNORMAL LOW (ref 3.5–5.0)
Anion gap: 10 (ref 5–15)
BILIRUBIN TOTAL: 0.7 mg/dL (ref 0.3–1.2)
BUN: 17 mg/dL (ref 6–20)
CALCIUM: 9 mg/dL (ref 8.9–10.3)
CO2: 25 mmol/L (ref 22–32)
CREATININE: 0.95 mg/dL (ref 0.44–1.00)
Chloride: 100 mmol/L — ABNORMAL LOW (ref 101–111)
GFR, EST NON AFRICAN AMERICAN: 55 mL/min — AB (ref >60)
Glucose, Bld: 90 mg/dL (ref 65–99)
Potassium: 4 mmol/L (ref 3.5–5.1)
Sodium: 135 mmol/L (ref 135–145)
Total Protein: 6.9 g/dL (ref 6.5–8.1)

## 2016-11-01 LAB — PROTIME-INR
INR: 2.29
PROTHROMBIN TIME: 25.6 s — AB (ref 11.4–15.2)

## 2016-11-01 MED ORDER — OMEPRAZOLE 40 MG PO CPDR: 40.0000 mg | DELAYED_RELEASE_CAPSULE | Freq: Every day | ORAL | 2 refills | Status: AC

## 2016-11-01 MED ORDER — SODIUM CHLORIDE 0.9% FLUSH
10.0000 mL | INTRAVENOUS | Status: DC | PRN
Start: 2016-11-01 — End: 2016-11-01

## 2016-11-01 MED ORDER — DOCUSATE CALCIUM 240 MG PO CAPS: 240.0000 mg | ORAL_CAPSULE | Freq: Every day | ORAL | 1 refills | Status: DC | PRN

## 2016-11-01 MED ORDER — FAMOTIDINE IN NACL 20-0.9 MG/50ML-% IV SOLN
20.0000 mg | Freq: Once | INTRAVENOUS | Status: AC
  Administered 2016-11-01: 20 mg via INTRAVENOUS
  Filled 2016-11-01: qty 50

## 2016-11-01 MED ORDER — HEPARIN SOD (PORK) LOCK FLUSH 100 UNIT/ML IV SOLN
500.0000 [IU] | Freq: Once | INTRAVENOUS | Status: AC | PRN
  Administered 2016-11-01: 500 [IU]
  Filled 2016-11-01 (×2): qty 5

## 2016-11-01 MED ORDER — PALONOSETRON HCL INJECTION 0.25 MG/5ML
0.2500 mg | Freq: Once | INTRAVENOUS | Status: AC
  Administered 2016-11-01: 0.25 mg via INTRAVENOUS
  Filled 2016-11-01: qty 5

## 2016-11-01 MED ORDER — SODIUM CHLORIDE 0.9 % IV SOLN
134.8000 mg | Freq: Once | INTRAVENOUS | Status: AC
  Administered 2016-11-01: 130 mg via INTRAVENOUS
  Filled 2016-11-01: qty 13

## 2016-11-01 MED ORDER — SODIUM CHLORIDE 0.9 % IV SOLN
20.0000 mg | Freq: Once | INTRAVENOUS | Status: AC
  Administered 2016-11-01: 20 mg via INTRAVENOUS
  Filled 2016-11-01: qty 2

## 2016-11-01 MED ORDER — DIPHENHYDRAMINE HCL 50 MG/ML IJ SOLN
12.5000 mg | Freq: Once | INTRAMUSCULAR | Status: AC
  Administered 2016-11-01: 50 mg via INTRAVENOUS
  Filled 2016-11-01: qty 1

## 2016-11-01 MED ORDER — SODIUM CHLORIDE 0.9 % IV SOLN
Freq: Once | INTRAVENOUS | Status: AC
  Administered 2016-11-01: 11:00:00 via INTRAVENOUS

## 2016-11-01 MED ORDER — DEXTROSE 5 % IV SOLN
45.0000 mg/m2 | Freq: Once | INTRAVENOUS | Status: AC
  Administered 2016-11-01: 72 mg via INTRAVENOUS
  Filled 2016-11-01: qty 12

## 2016-11-01 NOTE — Progress Notes (Signed)
Patient tolerated infusion without incidence. Patient discharged ambulatory and in stable condition from clinic with daughter to home. Patient to follow up as scheduled next week.

## 2016-11-01 NOTE — Progress Notes (Signed)
[facility] Hematology/Oncology Progress Note   Name: Alexandria Chen      MRN: [id number]    Location: Room/bed info not found  Date: 11/01/2016 Time:11:26 AM   REFERRING PHYSICIAN:  Evelina Dun, FNP (Primary Care Provider)  REASON FOR CONSULT:  4.9 x 3.3 cm RUL pulmonary mass.    DIAGNOSIS:  4.9 x 3.3 cm RUL pulmonary mass with concerns for direct mediastinal invasion and right hilar, right paratracheal lymph node enlargement without distant site of metastatic disease.   Squamous cell carcinoma of right lung (HCC)   06/25/2016 Imaging    CT chest- 1. 4.9 x 3.3 cm mass in the medial aspect of the right upper lobe which demonstrates evidence of potential direct mediastinal invasion, and pathologically enlarged right hilar and right paratracheal lymph nodes which suggest nodal metastatic disease. These findings are highly concerning for primary bronchogenic carcinoma and further evaluation with PET-CT is strongly recommended in the near future for further diagnostic and staging purposes. 2. There is also nodularity in the adrenal glands bilaterally, which could reflect additional sites of metastatic disease. Attention at time of forthcoming PET-CT is recommended. 3. Indeterminate lesion in the right lobe of the liver, favored to represent a cavernous hemangioma. Should this lesion prove to be hypermetabolic on PET imaging, further characterization with MRI of the abdomen with and without IV gadolinium could provide definitive characterization. 4. Aortic atherosclerosis, in addition to left main and 2 vessel coronary artery disease. Assessment for potential risk factor modification, dietary therapy or pharmacologic therapy may be warranted, if clinically indicated. 5. Mild diffuse bronchial wall thickening with mild centrilobular and paraseptal emphysema; imaging findings suggestive of underlying COPD. 6. Additional incidental findings, as above.      07/06/2016  Imaging    CT head wo contrast- 1. No acute finding. 2. Advanced chronic microvascular disease.      07/07/2016 PET scan    4.7 cm mass in the medial right upper lobe abutting/invading the mediastinum, suspicious for primary bronchogenic neoplasm.  Possible mild mediastinal/right hilar nodal metastases, equivocal.  No evidence of distant metastases.  3.6 x 2.3 cm subcapsular lesion in the inferior right liver is non FDG avid, likely reflecting a benign hemangioma when correlating with prior CT.      08/27/2016 Procedure    Needle core biopsy of the anterior right upper lobe mass by interventional radiology      08/30/2016 Pathology Results    Squamous cell carcinoma        HISTORY OF PRESENT ILLNESS:   Alexandria Chen is a 82 y.o. female with a medical history significant for history of alcoholism, anxiety/depression, essential hypertension, hyperlipidemia, and vitamin D deficiency, a-fib on Eliquis with plans to transition to warfarin on 08/16/2016 who is referred to the [facility] for right upper lung pulmonary mass measuring 4.9 cm in largest measured dimension.   Patient reports "feel pretty good, but not like it should."  She is unable to tell me exactly what that means.  Patient is accompanied by multiple family members and they all agree that their mother is in denial of recent findings on imaging.  Apparently the patient presented to her primary care provider with some altered mental status issues resulting in a workup including a chest x-ray.  Chest x-ray on 06/24/2016 demonstrated soft tissue mass in the right upper lobe and CT imaging was recommended.  This was followed by CT imaging on 06/25/2016 showing a large  4.9 x 3.3 cm mass in the medial aspect of the right upper lobe demonstrating evidence of potential direct mediastinal invasion pathologically enlarged right hilar and right paratracheal lymph nodes suggestive of nodal metastatic disease.  All findings are  concerning for primary bronchogenic carcinoma.  PET imaging was subsequently recommended.  PET scan was performed on 07/07/2016 demonstrating a hypermetabolic 4.8 cm mass in the medial right upper lobe abutting/invading the mediastinum suspicious for primary bronchogenic carcinoma with possible mild mediastinal/right hilar nodal metastases equivocal.  She was scheduled to see medical oncology immediately following PET scan but unfortunately the patient was in [place] and became dizzy resulting in an admission at [facility].  Therefore, her new patient appointment was deferred until discharge.  At [facility], she was found to be in atrial fibrillation.  She underwent cardioversion and anticoagulation therapy.  She is currently on Eliquis.  Plan is to transition her to Coumadin in April 2018.  Patient and family have been advised that she is to remain on anticoagulation.  INTERVAL HISTORY: Patient is here for cycle 5 of weekly chemotherapy with carboplatin and Taxol. She has been tolerating her treatments well except for fatigue which she feels is worsening. She denies any nausea, vomiting, diarrhea, neuropathy. She denies any dysphagia. Patient states that she continues to have constipation with hard stools despite taking miralax. Also complains of acid reflux.   Review of Systems  Constitutional: Positive for malaise/fatigue. Negative for chills and fever.  HENT: Negative.  Negative for hearing loss, sore throat and tinnitus.   Eyes: Negative.  Negative for blurred vision, photophobia and discharge.  Respiratory: Negative for cough, hemoptysis, shortness of breath and wheezing.   Cardiovascular: Negative.  Negative for chest pain, palpitations, orthopnea, claudication and leg swelling.  Gastrointestinal: Negative.  Negative for abdominal pain, constipation, diarrhea, melena, nausea and vomiting.  Genitourinary: Negative.  Negative for dysuria and  hematuria.  Musculoskeletal: Negative.  Negative for back pain, joint pain and myalgias.  Skin: Negative.  Negative for itching and rash.  Neurological: Negative for dizziness, weakness and headaches.  Endo/Heme/Allergies: Negative.  Negative for environmental allergies and polydipsia. Does not bruise/bleed easily.  Psychiatric/Behavioral: Negative for depression and substance abuse. The patient is not nervous/anxious and does not have insomnia.     PAST MEDICAL HISTORY:   Past Medical History:  Diagnosis Date  . Alcohol abuse, in remission   . Anxiety   . Atrial fibrillation ([place])   . Depression   . Dyspnea    walking distances   . Dysrhythmia   . History of bronchitis   . HTN (hypertension)   . Mass of right lung 07/13/2016  . Osteopenia   . Squamous cell carcinoma of right lung ([place]) 07/13/2016    ALLERGIES: No Known Allergies    MEDICATIONS: I have reviewed the patient's current medications.    Current Outpatient Prescriptions on File Prior to Visit  Medication Sig Dispense Refill  . amiodarone (PACERONE) 200 MG tablet Take 200 mg by mouth daily.     Marland Kitchen atorvastatin (LIPITOR) 40 MG tablet TAKE 1 TABLET (40 MG TOTAL) BY MOUTH DAILY. FOR HYPERLIPIDEMIA 90 tablet 1  . busPIRone (BUSPAR) 15 MG tablet Take 1 tablet (15 mg total) by mouth 2 (two) times daily. 180 tablet 2  . Calcium Carb-Cholecalciferol (CALCIUM 500+D3) 500-400 MG-UNIT TABS Take 1 tablet by mouth 2 (two) times daily.    Marland Kitchen CARBOPLATIN IV Inject into the vein. weekly    . dexamethasone (  DECADRON) 4 MG tablet Take 2 tablets (8 mg total) by mouth daily. Start the day after chemotherapy for 2 days. 30 tablet 1  . DULoxetine (CYMBALTA) 60 MG capsule Take 1 capsule (60 mg total) by mouth daily. 30 capsule 2  . fish oil-omega-3 fatty acids 1000 MG capsule Take 3 capsules (3 g total) by mouth daily. For hyperlipidemia. (Patient taking differently: Take 1 g by mouth 3 (three) times daily. For hyperlipidemia.) 30 capsule   .  fluticasone (FLONASE) 50 MCG/ACT nasal spray Place 2 sprays into both nostrils 2 (two) times daily. Use as needed (Patient taking differently: Place 2 sprays into both nostrils 2 (two) times daily as needed for allergies. Use as needed) 16 g 6  . gabapentin (NEURONTIN) 100 MG capsule Take 1 capsule (100 mg total) by mouth at bedtime. 90 capsule 2  . lidocaine-prilocaine (EMLA) cream Apply to affected area once 30 g 3  . lisinopril (PRINIVIL,ZESTRIL) 5 MG tablet Take 5 mg by mouth.    Marland Kitchen LORazepam (ATIVAN) 0.5 MG tablet Take 1 tablet (0.5 mg total) by mouth every 6 (six) hours as needed (Nausea or vomiting). 30 tablet 0  . meclizine (ANTIVERT) 25 MG tablet Take 25 mg by mouth 3 (three) times daily as needed for dizziness.    . nitroGLYCERIN (NITROSTAT) 0.4 MG SL tablet Place 0.4 mg under the tongue.    Marland Kitchen PACLitaxel (TAXOL IV) Inject into the vein. Weekly    . prochlorperazine (COMPAZINE) 10 MG tablet Take 1 tablet (10 mg total) by mouth every 6 (six) hours as needed (Nausea or vomiting). 30 tablet 1  . warfarin (COUMADIN) 2.5 MG tablet Take 2.5 mg by mouth every evening.      Current Facility-Administered Medications on File Prior to Visit  Medication Dose Route Frequency Provider Last Rate Last Dose  . CARBOplatin (PARAPLATIN) 130 mg in sodium chloride 0.9 % 250 mL chemo infusion  130 mg Intravenous Once Twana First, MD      . dexamethasone (DECADRON) 20 mg in sodium chloride 0.9 % 50 mL IVPB  20 mg Intravenous Once Twana First, MD   20 mg at 11/01/16 1114  . heparin lock flush 100 unit/mL  500 Units Intracatheter Once PRN Twana First, MD      . PACLitaxel (TAXOL) 72 mg in dextrose 5 % 250 mL chemo infusion (</= 35m/m2)  45 mg/m2 (Treatment Plan Recorded) Intravenous Once ZTwana First MD      . sodium chloride flush (NS) 0.9 % injection 10 mL  10 mL Intracatheter PRN ZTwana First MD         PAST SURGICAL HISTORY Past Surgical History:  Procedure Laterality Date  . IR FLUORO GUIDE PORT  INSERTION RIGHT  09/17/2016  . IR [place]IDE VASC ACCESS RIGHT  09/17/2016  . NO PAST SURGERIES      FAMILY HISTORY: Family History  Problem Relation Age of Onset  . Anxiety disorder Mother   . Dementia Mother   . Hypertension Mother   . Alcohol abuse Father   . Arthritis Father   . Alcohol abuse Brother   . ADD / ADHD Neg Hx   . Drug abuse Neg Hx   . Bipolar disorder Neg Hx   . Depression Neg Hx   . OCD Neg Hx   . Paranoid behavior Neg Hx   . Schizophrenia Neg Hx   . Seizures Neg Hx   . Sexual abuse Neg Hx   . Physical abuse Neg Hx  Mother deceased at the age of 31 from complications of pneumonia. Father passed away in his late 42s secondary to myocardial infarction. She has 1 sister She has 5 children.  4 daughters and 1 son.  All daughters are present today during discussion. She denies any family history of cancer.  SOCIAL HISTORY:  reports that she quit smoking about 3 months ago. Her smoking use included Cigarettes. She started smoking about 60 years ago. She has a 120.00 pack-year smoking history. She has never used smokeless tobacco. She reports that she does not drink alcohol or use drugs.  She quit drinking alcohol in 2014 with a past drinking history of 3 beers per day favoring [employer] and natural light.  She denies any illicit drug abuse.  She is retired and worked at a [employer] as a Training and development officer.  She admits that she is not religious.  She is a widower  5 years.  Her husband passed away secondary to complications of heart disease and diabetes.  Social History   Social History  . Marital status: Widowed    Spouse name: N/A  . Number of children: N/A  . Years of education: N/A   Social History Main Topics  . Smoking status: Former Smoker    Packs/day: 2.00    Years: 60.00    Types: Cigarettes    Start date: 05/03/1956    Quit date: 07/15/2016  . Smokeless tobacco: Never Used  . Alcohol use No     Comment: Patient reports no alcohol use since 05/24/2016.    . Drug use: No  . Sexual activity: No   Other Topics Concern  . None   Social History Narrative  . None    PERFORMANCE STATUS: The patient's performance status is 1 - Symptomatic but completely ambulatory  PHYSICAL EXAM:  Physical Exam  Constitutional: She is oriented to person, place, and time and well-developed, well-nourished, and in no distress. No distress.  HENT:  Head: Normocephalic and atraumatic.  Mouth/Throat: No oropharyngeal exudate.  Eyes: Conjunctivae and EOM are normal. Pupils are equal, round, and reactive to light. No scleral icterus.  Neck: Normal range of motion. Neck supple. No JVD present.  Cardiovascular: Normal rate, regular rhythm and normal heart sounds.  Exam reveals no gallop and no friction rub.   No murmur heard. Pulmonary/Chest: Effort normal and breath sounds normal. No respiratory distress. She has no wheezes. She has no rales.  Abdominal: Soft. Bowel sounds are normal. She exhibits no distension. There is no tenderness. There is no guarding.  Musculoskeletal: Normal range of motion. She exhibits no edema or tenderness.  Lymphadenopathy:    She has no cervical adenopathy.  Neurological: She is alert and oriented to person, place, and time. No cranial nerve deficit. Gait normal.  Skin: Skin is warm and dry. No rash noted. No erythema. No pallor.  Psychiatric: Affect and judgment normal.  Nursing note and vitals reviewed.    LABORATORY DATA:  Results for orders placed or performed in visit on 11/01/16 (from the past 48 hour(s))  CBC with Differential     Status: Abnormal   Collection Time: 11/01/16 10:02 AM  Result Value Ref Range   WBC 4.7 4.0 - 10.5 K/uL   RBC 3.69 (L) 3.87 - 5.11 MIL/uL   Hemoglobin 11.0 (L) 12.0 - 15.0 g/dL   HCT 32.8 (L) 36.0 - 46.0 %   MCV 88.9 78.0 - 100.0 fL   MCH 29.8 26.0 - 34.0 pg   MCHC 33.5  30.0 - 36.0 g/dL   RDW 14.9 11.5 - 15.5 %   Platelets 263 150 - 400 K/uL   Neutrophils Relative % 68 %   Neutro Abs  3.2 1.7 - 7.7 K/uL   Lymphocytes Relative 15 %   Lymphs Abs 0.7 0.7 - 4.0 K/uL   Monocytes Relative 17 %   Monocytes Absolute 0.8 0.1 - 1.0 K/uL   Eosinophils Relative 0 %   Eosinophils Absolute 0.0 0.0 - 0.7 K/uL   Basophils Relative 0 %   Basophils Absolute 0.0 0.0 - 0.1 K/uL  Comprehensive metabolic panel     Status: Abnormal   Collection Time: 11/01/16 10:02 AM  Result Value Ref Range   Sodium 135 135 - 145 mmol/L   Potassium 4.0 3.5 - 5.1 mmol/L   Chloride 100 (L) 101 - 111 mmol/L   CO2 25 22 - 32 mmol/L   Glucose, Bld 90 65 - 99 mg/dL   BUN 17 6 - 20 mg/dL   Creatinine, Ser 0.95 0.44 - 1.00 mg/dL   Calcium 9.0 8.9 - 10.3 mg/dL   Total Protein 6.9 6.5 - 8.1 g/dL   Albumin 2.9 (L) 3.5 - 5.0 g/dL   AST 13 (L) 15 - 41 U/L   ALT 14 14 - 54 U/L   Alkaline Phosphatase 58 38 - 126 U/L   Total Bilirubin 0.7 0.3 - 1.2 mg/dL   GFR calc non Af Amer 55 (L) >60 mL/min   GFR calc Af Amer >60 >60 mL/min    Comment: (NOTE) The eGFR has been calculated using the CKD EPI equation. This calculation has not been validated in all clinical situations. eGFR's persistently <60 mL/min signify possible Chronic Kidney Disease.    Anion gap 10 5 - 15  Protime-INR     Status: Abnormal   Collection Time: 11/01/16 10:02 AM  Result Value Ref Range   Prothrombin Time 25.6 (H) 11.4 - 15.2 seconds   INR 2.29       RADIOGRAPHY: CLINICAL DATA:  Initial treatment strategy for lung cancer.  EXAM: NUCLEAR MEDICINE PET SKULL BASE TO THIGH  TECHNIQUE: 6.3 mCi F-18 FDG was injected intravenously. Full-ring PET imaging was performed from the skull base to thigh after the radiotracer. CT data was obtained and used for attenuation correction and anatomic localization.  FASTING BLOOD GLUCOSE:  Value: 92 mg/dl  COMPARISON:  CT chest dated 06/25/2016  FINDINGS: NECK  No suspicious cervical lymphadenopathy.  CHEST  4.7 cm mass in the medial right upper lobe abutting/invading  the mediastinum (series 4/image 50), max SUV 17.5, suspicious for primary bronchogenic neoplasm.  Associated 9 mm lower right paratracheal node (series 4/image 50) with mild hypermetabolism, max SUV 3.4, indeterminate.  Mild hypermetabolism in the right hilar region, max SUV 4.0, poorly evaluated on unenhanced CT.  Underlying mild paraseptal emphysematous changes, upper lobe predominant.  ABDOMEN/PELVIS  No abnormal hypermetabolic activity within the liver, pancreas, adrenal glands, or spleen.  3.6 x 2.3 cm subcapsular hypodense lesion along the lateral aspect of segment 5 (series 4/image 95), non FDG avid, likely reflecting a benign hemangioma when correlating with prior CT.  No hypermetabolic lymph nodes in the abdomen or pelvis.  SKELETON  No focal hypermetabolic activity to suggest skeletal metastasis.  IMPRESSION: 4.7 cm mass in the medial right upper lobe abutting/invading the mediastinum, suspicious for primary bronchogenic neoplasm.  Possible mild mediastinal/right hilar nodal metastases, equivocal.  No evidence of distant metastases.  3.6 x 2.3 cm subcapsular lesion in the inferior right  liver is non FDG avid, likely reflecting a benign hemangioma when correlating with prior CT.   Electronically Signed   By: Julian Hy M.D.   On: 07/07/2016 14:13   CLINICAL DATA:  Headache.  History of lung cancer  EXAM: CT HEAD WITHOUT CONTRAST  TECHNIQUE: Contiguous axial images were obtained from the base of the skull through the vertex without intravenous contrast.  COMPARISON:  05/02/2015 brain MRI  FINDINGS: Brain: No evidence of acute infarction, hemorrhage, hydrocephalus, extra-axial collection or mass lesion/mass effect.  Advanced chronic microvascular disease with confluent white matter gliosis in the cerebral white matter and multiple remote lacunar infarcts in the deep gray nuclei and deep white matter tracts. Small remote  left cerebellar infarct.  Vascular: Atherosclerosis.  No hyperdense vessel.  Skull: Normal. Negative for fracture or focal lesion.  Sinuses/Orbits: No acute finding.  IMPRESSION: 1. No acute finding. 2. Advanced chronic microvascular disease.   Electronically Signed   By: Monte Fantasia M.D.   On: 07/06/2016 07:22    CLINICAL DATA:  82 year old female with abnormal chest x-ray concerning for lung cancer. Followup study.  EXAM: CT CHEST WITH CONTRAST  TECHNIQUE: Multidetector CT imaging of the chest was performed during intravenous contrast administration.  CONTRAST:  68m ISOVUE-300 IOPAMIDOL (ISOVUE-300) INJECTION 61%  COMPARISON:  No prior chest CT.  Chest x-ray 06/24/2016.  FINDINGS: Cardiovascular: Heart size is mildly enlarged. There is no significant pericardial fluid, thickening or pericardial calcification. There is aortic atherosclerosis, as well as atherosclerosis of the great vessels of the mediastinum and the coronary arteries, including calcified atherosclerotic plaque in the left main, left anterior descending and left circumflex coronary arteries.  Mediastinum/Nodes: Multiple enlarged right hilar and mediastinal lymph nodes. The largest of these include a 1.3 cm right hilar lymph node (image 59 of series 2) and a 1.4 cm low right paratracheal lymph node (image 54 of series 2). No contralateral mediastinal or left hilar lymphadenopathy. Esophagus is unremarkable in appearance. No axillary lymphadenopathy.  Lungs/Pleura: Large mass in the medial aspect of the right upper lobe measuring 4.9 x 3.3 cm (axial image 53 of series 2). This makes a broad contact with the pleura adjacent to the mediastinum, obscuring the intervening fat plane and slightly distorting the adjacent superior vena cava, which could indicate very early invasion (not yet definitive). In the surrounding lung parenchyma distal to the mass there are areas of septal  thickening, which may reflect postobstructive changes or surrounding lymphangitic spread of disease. 4 mm pleural-based nodule associated with the minor fissure is nonspecific, but favored to represent a subpleural lymph node. No acute consolidative airspace disease. No pleural effusions. Patchy areas of peripheral predominant ground-glass attenuation are also noted throughout the lung bases bilaterally. Mild diffuse bronchial wall thickening with mild centrilobular and paraseptal emphysema.  Upper Abdomen: Incompletely visualized lesion in the right lobe of the liver which demonstrates some peripheral nodular enhancement, favored to represent a cavernous hemangioma (but not characterized) measuring at least 3.8 x 2.3 cm (image 146 of series 2). Incompletely visualized low-attenuation lesion measuring at least 2.1 x 3.3 cm in the anterior aspect of the interpolar region of the left kidney is not characterized, but likely a large cyst. Several other tiny subcentimeter low-attenuation lesions also noted in the kidneys bilaterally, also incompletely characterized due to their small size, but favored to represent tiny cysts. Multiple nodules associated with the adrenal glands bilaterally, incompletely characterized on today's contrast enhanced examination, largest of which measures up to 1.4 cm on the  left. Aortic atherosclerosis.  Musculoskeletal: There are no aggressive appearing lytic or blastic lesions noted in the visualized portions of the skeleton.  IMPRESSION: 1. 4.9 x 3.3 cm mass in the medial aspect of the right upper lobe which demonstrates evidence of potential direct mediastinal invasion, and pathologically enlarged right hilar and right paratracheal lymph nodes which suggest nodal metastatic disease. These findings are highly concerning for primary bronchogenic carcinoma and further evaluation with PET-CT is strongly recommended in the near future for further diagnostic  and staging purposes. 2. There is also nodularity in the adrenal glands bilaterally, which could reflect additional sites of metastatic disease. Attention at time of forthcoming PET-CT is recommended. 3. Indeterminate lesion in the right lobe of the liver, favored to represent a cavernous hemangioma. Should this lesion prove to be hypermetabolic on PET imaging, further characterization with MRI of the abdomen with and without IV gadolinium could provide definitive characterization. 4. Aortic atherosclerosis, in addition to left main and 2 vessel coronary artery disease. Assessment for potential risk factor modification, dietary therapy or pharmacologic therapy may be warranted, if clinically indicated. 5. Mild diffuse bronchial wall thickening with mild centrilobular and paraseptal emphysema; imaging findings suggestive of underlying COPD. 6. Additional incidental findings, as above.   Electronically Signed   By: Vinnie Langton M.D.   On: 06/25/2016 15:43   CT HEAD WITHOUT CONTRAST 07/06/2016  IMPRESSION: 1. No acute finding. 2. Advanced chronic microvascular disease.  NUCLEAR MEDICINE PET SKULL BASE TO THIGH 07/07/2016  IMPRESSION: 4.7 cm mass in the medial right upper lobe abutting/invading the mediastinum, suspicious for primary bronchogenic neoplasm.  Possible mild mediastinal/right hilar nodal metastases, equivocal.  No evidence of distant metastases.  3.6 x 2.3 cm subcapsular lesion in the inferior right liver is non FDG avid, likely reflecting a benign hemangioma when correlating with prior CT.    PORTABLE CHEST 1 VIEW 08/27/2016   IMPRESSION: RIGHT upper lobe mass and suspected superior RIGHT hilar adenopathy without evidence of pneumothorax post biopsy.   PATHOLOGY:    ASSESSMENT/PLAN:  Stage IIIB (T4N2M0) squamous cell carcinoma of the right lung. Currently getting concurrent chemoRT. RT started on 10/04/16. Cycle 1 of chemo with carbo/taxol  started on 09/20/16.   PLAN: Tolerating chemotherapy well except for fatigue. Labs reviewed today. Proceed with cycle 5. Continue weekly carbo/taxol as scheduled. Will plan for total of 7 cycles of chemo. Her last RT treatment is scheduled for 11/16/16. RTC in 2 weeks for follow up.  Sent in an Rx for docusate daily PRN for constipation to take in addition to miralax to help with her constipation. Sent in an Rx for omeprazole PO daily to help with her acid reflux.   This note is electronically signed by: Twana First, MD 11/01/2016 11:26 AM

## 2016-11-01 NOTE — Patient Instructions (Signed)
[facility] Discharge Instructions for Patients Receiving Chemotherapy   Beginning January 23rd 2017 lab work for the [facility] will be done in the  Main lab at [facility] on 1st floor. If you have a lab appointment with the [place] please come in thru the  Main Entrance and check in at the main information desk   Today you received the following chemotherapy agent: Carboplatin, Taxol  To help prevent nausea and vomiting after your treatment, we encourage you to take your nausea medication as prescribed.  If you develop nausea and vomiting, or diarrhea that is not controlled by your medication, call the clinic.  Follow up next week as scheduled.   The clinic phone number is [phone]. Office hours are Monday-Friday 8:30am-5:00pm.  BELOW ARE SYMPTOMS THAT SHOULD BE REPORTED IMMEDIATELY:  *FEVER GREATER THAN 101.0 F  *CHILLS WITH OR WITHOUT FEVER  NAUSEA AND VOMITING THAT IS NOT CONTROLLED WITH YOUR NAUSEA MEDICATION  *UNUSUAL SHORTNESS OF BREATH  *UNUSUAL BRUISING OR BLEEDING  TENDERNESS IN MOUTH AND THROAT WITH OR WITHOUT PRESENCE OF ULCERS  *URINARY PROBLEMS  *BOWEL PROBLEMS  UNUSUAL RASH Items with * indicate a potential emergency and should be followed up as soon as possible. If you have an emergency after office hours please contact your primary care physician or go to the nearest emergency department.  Please call the clinic during office hours if you have any questions or concerns.   You may also contact the Patient Navigator at [phone] should you have any questions or need assistance in obtaining follow up care.      Resources For Cancer Patients and their Caregivers ? American Cancer Society: Can assist with transportation, wigs, general needs, runs Look Good Feel Better.        [phone] ? Cancer Care: Provides financial assistance, online support groups, medication/co-pay assistance.  1-800-813-HOPE  9856439372) ? [facility] Assists [place] cancer patients and their families through emotional , educational and financial support.  (507)307-7263 ? [place] DSS Where to apply for food stamps, Medicaid and utility assistance. 4307103167 ? [employer]: Transportation to medical appointments. 407-593-4187 ? Social Security Administration: May apply for disability if have a Stage IV cancer. 304-048-5698 (323) [phone] ? [employer], Disability and Transit Services: Assists with nutrition, care and transit needs. [phone]

## 2016-11-02 ENCOUNTER — Ambulatory Visit (INDEPENDENT_AMBULATORY_CARE_PROVIDER_SITE_OTHER): Payer: Medicare Other | Admitting: Pharmacist

## 2016-11-02 DIAGNOSIS — I4891 Unspecified atrial fibrillation: Secondary | ICD-10-CM

## 2016-11-02 DIAGNOSIS — I10 Essential (primary) hypertension: Secondary | ICD-10-CM | POA: Diagnosis not present

## 2016-11-02 DIAGNOSIS — C3411 Malignant neoplasm of upper lobe, right bronchus or lung: Secondary | ICD-10-CM | POA: Diagnosis not present

## 2016-11-02 DIAGNOSIS — K769 Liver disease, unspecified: Secondary | ICD-10-CM | POA: Diagnosis not present

## 2016-11-02 DIAGNOSIS — R590 Localized enlarged lymph nodes: Secondary | ICD-10-CM | POA: Diagnosis not present

## 2016-11-02 DIAGNOSIS — J449 Chronic obstructive pulmonary disease, unspecified: Secondary | ICD-10-CM | POA: Diagnosis not present

## 2016-11-04 DIAGNOSIS — J449 Chronic obstructive pulmonary disease, unspecified: Secondary | ICD-10-CM | POA: Diagnosis not present

## 2016-11-04 DIAGNOSIS — I4891 Unspecified atrial fibrillation: Secondary | ICD-10-CM | POA: Diagnosis not present

## 2016-11-04 DIAGNOSIS — K769 Liver disease, unspecified: Secondary | ICD-10-CM | POA: Diagnosis not present

## 2016-11-04 DIAGNOSIS — C3411 Malignant neoplasm of upper lobe, right bronchus or lung: Secondary | ICD-10-CM | POA: Diagnosis not present

## 2016-11-04 DIAGNOSIS — R590 Localized enlarged lymph nodes: Secondary | ICD-10-CM | POA: Diagnosis not present

## 2016-11-04 DIAGNOSIS — I10 Essential (primary) hypertension: Secondary | ICD-10-CM | POA: Diagnosis not present

## 2016-11-05 DIAGNOSIS — C3411 Malignant neoplasm of upper lobe, right bronchus or lung: Secondary | ICD-10-CM | POA: Diagnosis not present

## 2016-11-05 DIAGNOSIS — K769 Liver disease, unspecified: Secondary | ICD-10-CM | POA: Diagnosis not present

## 2016-11-05 DIAGNOSIS — R590 Localized enlarged lymph nodes: Secondary | ICD-10-CM | POA: Diagnosis not present

## 2016-11-05 DIAGNOSIS — J449 Chronic obstructive pulmonary disease, unspecified: Secondary | ICD-10-CM | POA: Diagnosis not present

## 2016-11-05 DIAGNOSIS — I4891 Unspecified atrial fibrillation: Secondary | ICD-10-CM | POA: Diagnosis not present

## 2016-11-05 DIAGNOSIS — I10 Essential (primary) hypertension: Secondary | ICD-10-CM | POA: Diagnosis not present

## 2016-11-08 ENCOUNTER — Encounter (HOSPITAL_COMMUNITY): Payer: Medicare Other

## 2016-11-08 ENCOUNTER — Encounter (HOSPITAL_COMMUNITY): Payer: Self-pay

## 2016-11-08 ENCOUNTER — Encounter (HOSPITAL_BASED_OUTPATIENT_CLINIC_OR_DEPARTMENT_OTHER): Payer: Medicare Other

## 2016-11-08 VITALS — BP 139/71 | HR 76 | Temp 98.2°F | Resp 18 | Wt 132.0 lb

## 2016-11-08 DIAGNOSIS — C3411 Malignant neoplasm of upper lobe, right bronchus or lung: Secondary | ICD-10-CM | POA: Diagnosis not present

## 2016-11-08 DIAGNOSIS — Z5111 Encounter for antineoplastic chemotherapy: Secondary | ICD-10-CM

## 2016-11-08 DIAGNOSIS — C3491 Malignant neoplasm of unspecified part of right bronchus or lung: Secondary | ICD-10-CM

## 2016-11-08 DIAGNOSIS — R918 Other nonspecific abnormal finding of lung field: Secondary | ICD-10-CM | POA: Diagnosis not present

## 2016-11-08 LAB — CBC WITH DIFFERENTIAL/PLATELET
Basophils Absolute: 0 10*3/uL (ref 0.0–0.1)
Basophils Relative: 0 %
EOS ABS: 0 10*3/uL (ref 0.0–0.7)
EOS PCT: 0 %
HCT: 33.8 % — ABNORMAL LOW (ref 36.0–46.0)
Hemoglobin: 11.1 g/dL — ABNORMAL LOW (ref 12.0–15.0)
LYMPHS ABS: 0.5 10*3/uL — AB (ref 0.7–4.0)
Lymphocytes Relative: 12 %
MCH: 29.4 pg (ref 26.0–34.0)
MCHC: 32.8 g/dL (ref 30.0–36.0)
MCV: 89.4 fL (ref 78.0–100.0)
MONO ABS: 0.3 10*3/uL (ref 0.1–1.0)
Monocytes Relative: 7 %
Neutro Abs: 3.5 10*3/uL (ref 1.7–7.7)
Neutrophils Relative %: 81 %
PLATELETS: 288 10*3/uL (ref 150–400)
RBC: 3.78 MIL/uL — AB (ref 3.87–5.11)
RDW: 14.9 % (ref 11.5–15.5)
WBC: 4.4 10*3/uL (ref 4.0–10.5)

## 2016-11-08 LAB — COMPREHENSIVE METABOLIC PANEL
ALT: 17 U/L (ref 14–54)
AST: 17 U/L (ref 15–41)
Albumin: 3 g/dL — ABNORMAL LOW (ref 3.5–5.0)
Alkaline Phosphatase: 60 U/L (ref 38–126)
Anion gap: 6 (ref 5–15)
BUN: 16 mg/dL (ref 6–20)
CHLORIDE: 104 mmol/L (ref 101–111)
CO2: 25 mmol/L (ref 22–32)
CREATININE: 0.89 mg/dL (ref 0.44–1.00)
Calcium: 8.6 mg/dL — ABNORMAL LOW (ref 8.9–10.3)
GFR, EST NON AFRICAN AMERICAN: 60 mL/min — AB (ref >60)
Glucose, Bld: 115 mg/dL — ABNORMAL HIGH (ref 65–99)
POTASSIUM: 3.9 mmol/L (ref 3.5–5.1)
SODIUM: 135 mmol/L (ref 135–145)
Total Bilirubin: 0.5 mg/dL (ref 0.3–1.2)
Total Protein: 6.7 g/dL (ref 6.5–8.1)

## 2016-11-08 MED ORDER — DIPHENHYDRAMINE HCL 50 MG/ML IJ SOLN
12.5000 mg | Freq: Once | INTRAMUSCULAR | Status: AC
  Administered 2016-11-08: 12.5 mg via INTRAVENOUS
  Filled 2016-11-08: qty 1

## 2016-11-08 MED ORDER — SODIUM CHLORIDE 0.9 % IV SOLN
20.0000 mg | Freq: Once | INTRAVENOUS | Status: AC
  Administered 2016-11-08: 20 mg via INTRAVENOUS
  Filled 2016-11-08: qty 2

## 2016-11-08 MED ORDER — DEXTROSE 5 % IV SOLN
45.0000 mg/m2 | Freq: Once | INTRAVENOUS | Status: AC
Start: 2016-11-08 — End: 2016-11-08
  Administered 2016-11-08: 72 mg via INTRAVENOUS
  Filled 2016-11-08: qty 12

## 2016-11-08 MED ORDER — SODIUM CHLORIDE 0.9 % IV SOLN
134.8000 mg | Freq: Once | INTRAVENOUS | Status: AC
  Administered 2016-11-08: 130 mg via INTRAVENOUS
  Filled 2016-11-08: qty 13

## 2016-11-08 MED ORDER — SODIUM CHLORIDE 0.9 % IV SOLN
Freq: Once | INTRAVENOUS | Status: AC
  Administered 2016-11-08: 11:00:00 via INTRAVENOUS

## 2016-11-08 MED ORDER — SODIUM CHLORIDE 0.9% FLUSH
10.0000 mL | INTRAVENOUS | Status: DC | PRN
  Administered 2016-11-08: 10 mL
  Filled 2016-11-08: qty 10

## 2016-11-08 MED ORDER — FAMOTIDINE IN NACL 20-0.9 MG/50ML-% IV SOLN
20.0000 mg | Freq: Once | INTRAVENOUS | Status: AC
  Administered 2016-11-08: 20 mg via INTRAVENOUS
  Filled 2016-11-08: qty 50

## 2016-11-08 MED ORDER — HEPARIN SOD (PORK) LOCK FLUSH 100 UNIT/ML IV SOLN
500.0000 [IU] | Freq: Once | INTRAVENOUS | Status: AC | PRN
  Administered 2016-11-08: 500 [IU]
  Filled 2016-11-08: qty 5

## 2016-11-08 MED ORDER — PALONOSETRON HCL INJECTION 0.25 MG/5ML
0.2500 mg | Freq: Once | INTRAVENOUS | Status: AC
  Administered 2016-11-08: 0.25 mg via INTRAVENOUS
  Filled 2016-11-08: qty 5

## 2016-11-08 NOTE — Patient Instructions (Signed)
[facility] Discharge Instructions for Patients Receiving Chemotherapy   Beginning January 23rd 2017 lab work for the Cancer Center will be done in the  Main lab at [place] on 1st floor. If you have a lab appointment with the Cancer Center please come in thru the  Main Entrance and check in at the main information desk   Today you received the following chemotherapy agents Taxol and Carboplatin. Follow-up as scheduled. Call clinic for any questions or concerns  To help prevent nausea and vomiting after your treatment, we encourage you to take your nausea medication.   If you develop nausea and vomiting, or diarrhea that is not controlled by your medication, call the clinic.  The clinic phone number is [phone]. Office hours are Monday-Friday 8:30am-5:00pm.  BELOW ARE SYMPTOMS THAT SHOULD BE REPORTED IMMEDIATELY:  *FEVER GREATER THAN 101.0 F  *CHILLS WITH OR WITHOUT FEVER  NAUSEA AND VOMITING THAT IS NOT CONTROLLED WITH YOUR NAUSEA MEDICATION  *UNUSUAL SHORTNESS OF BREATH  *UNUSUAL BRUISING OR BLEEDING  TENDERNESS IN MOUTH AND THROAT WITH OR WITHOUT PRESENCE OF ULCERS  *URINARY PROBLEMS  *BOWEL PROBLEMS  UNUSUAL RASH Items with * indicate a potential emergency and should be followed up as soon as possible. If you have an emergency after office hours please contact your primary care physician or go to the nearest emergency department.  Please call the clinic during office hours if you have any questions or concerns.   You may also contact the Patient Navigator at [phone] should you have any questions or need assistance in obtaining follow up care.      Resources For Cancer Patients and their Caregivers ? American Cancer Society: Can assist with transportation, wigs, general needs, runs Look Good Feel Better.        1-888-227-6333 ? Cancer Care: Provides financial assistance, online support groups, medication/co-pay assistance.   1-800-813-HOPE (4673) ? [facility] Assists [place] cancer patients and their families through emotional , educational and financial support.  336-427-4357 ? [place] DSS Where to apply for food stamps, Medicaid and utility assistance. 336-342-1394 ? [employer]: Transportation to medical appointments. 336-347-2287 ? Social Security Administration: May apply for disability if have a Stage IV cancer. 336-342-7796 1-800-772-1213 ? [place] Aging, Disability and Transit Services: Assists with nutrition, care and transit needs. [phone]         

## 2016-11-08 NOTE — Progress Notes (Signed)
.  Alexandria Chen tolerated chemo tx well without complaints or incident. Labs reviewed prior to administering chemotherapy. VSS upon discharge. Pt discharged self ambulatory in satisfactory condition accompanied by her daughter

## 2016-11-09 DIAGNOSIS — C3411 Malignant neoplasm of upper lobe, right bronchus or lung: Secondary | ICD-10-CM | POA: Diagnosis not present

## 2016-11-09 DIAGNOSIS — R590 Localized enlarged lymph nodes: Secondary | ICD-10-CM | POA: Diagnosis not present

## 2016-11-09 DIAGNOSIS — I10 Essential (primary) hypertension: Secondary | ICD-10-CM | POA: Diagnosis not present

## 2016-11-09 DIAGNOSIS — K769 Liver disease, unspecified: Secondary | ICD-10-CM | POA: Diagnosis not present

## 2016-11-09 DIAGNOSIS — I4891 Unspecified atrial fibrillation: Secondary | ICD-10-CM | POA: Diagnosis not present

## 2016-11-09 DIAGNOSIS — J449 Chronic obstructive pulmonary disease, unspecified: Secondary | ICD-10-CM | POA: Diagnosis not present

## 2016-11-10 DIAGNOSIS — C3411 Malignant neoplasm of upper lobe, right bronchus or lung: Secondary | ICD-10-CM | POA: Diagnosis not present

## 2016-11-10 DIAGNOSIS — J449 Chronic obstructive pulmonary disease, unspecified: Secondary | ICD-10-CM | POA: Diagnosis not present

## 2016-11-10 DIAGNOSIS — R590 Localized enlarged lymph nodes: Secondary | ICD-10-CM | POA: Diagnosis not present

## 2016-11-10 DIAGNOSIS — I10 Essential (primary) hypertension: Secondary | ICD-10-CM | POA: Diagnosis not present

## 2016-11-10 DIAGNOSIS — I4891 Unspecified atrial fibrillation: Secondary | ICD-10-CM | POA: Diagnosis not present

## 2016-11-10 DIAGNOSIS — K769 Liver disease, unspecified: Secondary | ICD-10-CM | POA: Diagnosis not present

## 2016-11-11 ENCOUNTER — Telehealth (HOSPITAL_COMMUNITY): Payer: Self-pay | Admitting: Emergency Medicine

## 2016-11-11 DIAGNOSIS — I4891 Unspecified atrial fibrillation: Secondary | ICD-10-CM | POA: Diagnosis not present

## 2016-11-11 DIAGNOSIS — C3411 Malignant neoplasm of upper lobe, right bronchus or lung: Secondary | ICD-10-CM | POA: Diagnosis not present

## 2016-11-11 DIAGNOSIS — K769 Liver disease, unspecified: Secondary | ICD-10-CM | POA: Diagnosis not present

## 2016-11-11 DIAGNOSIS — R590 Localized enlarged lymph nodes: Secondary | ICD-10-CM | POA: Diagnosis not present

## 2016-11-11 DIAGNOSIS — J449 Chronic obstructive pulmonary disease, unspecified: Secondary | ICD-10-CM | POA: Diagnosis not present

## 2016-11-11 DIAGNOSIS — I10 Essential (primary) hypertension: Secondary | ICD-10-CM | POA: Diagnosis not present

## 2016-11-11 NOTE — Telephone Encounter (Signed)
Alexandria Chen called and stated that [facility] was having some blood when she blew her nose.  Told her to use saline nasal spray.  Also her stomach in umbilical region has been hurting.  She had constipation at first now more diarrhea.  Explained how to use imodium but explained this could constipate also.

## 2016-11-12 ENCOUNTER — Other Ambulatory Visit (HOSPITAL_COMMUNITY): Payer: Self-pay | Admitting: *Deleted

## 2016-11-12 DIAGNOSIS — C3411 Malignant neoplasm of upper lobe, right bronchus or lung: Secondary | ICD-10-CM | POA: Diagnosis not present

## 2016-11-12 DIAGNOSIS — J449 Chronic obstructive pulmonary disease, unspecified: Secondary | ICD-10-CM | POA: Diagnosis not present

## 2016-11-12 DIAGNOSIS — I10 Essential (primary) hypertension: Secondary | ICD-10-CM | POA: Diagnosis not present

## 2016-11-12 DIAGNOSIS — R590 Localized enlarged lymph nodes: Secondary | ICD-10-CM | POA: Diagnosis not present

## 2016-11-12 DIAGNOSIS — I4891 Unspecified atrial fibrillation: Secondary | ICD-10-CM | POA: Diagnosis not present

## 2016-11-12 DIAGNOSIS — K769 Liver disease, unspecified: Secondary | ICD-10-CM | POA: Diagnosis not present

## 2016-11-12 MED ORDER — ONDANSETRON 8 MG PO TBDP: 8.0000 mg | ORAL_TABLET | Freq: Three times a day (TID) | ORAL | 1 refills | Status: DC | PRN

## 2016-11-15 ENCOUNTER — Encounter (HOSPITAL_BASED_OUTPATIENT_CLINIC_OR_DEPARTMENT_OTHER): Payer: Medicare Other

## 2016-11-15 ENCOUNTER — Ambulatory Visit (INDEPENDENT_AMBULATORY_CARE_PROVIDER_SITE_OTHER): Payer: Medicare Other | Admitting: Pharmacist

## 2016-11-15 ENCOUNTER — Encounter (HOSPITAL_COMMUNITY): Payer: Self-pay

## 2016-11-15 ENCOUNTER — Encounter (HOSPITAL_BASED_OUTPATIENT_CLINIC_OR_DEPARTMENT_OTHER): Payer: Medicare Other | Admitting: Oncology

## 2016-11-15 VITALS — BP 136/67 | HR 62 | Temp 98.4°F | Resp 18 | Wt 132.5 lb

## 2016-11-15 DIAGNOSIS — C3411 Malignant neoplasm of upper lobe, right bronchus or lung: Secondary | ICD-10-CM | POA: Diagnosis not present

## 2016-11-15 DIAGNOSIS — J449 Chronic obstructive pulmonary disease, unspecified: Secondary | ICD-10-CM | POA: Diagnosis not present

## 2016-11-15 DIAGNOSIS — Z5111 Encounter for antineoplastic chemotherapy: Secondary | ICD-10-CM | POA: Diagnosis not present

## 2016-11-15 DIAGNOSIS — C3491 Malignant neoplasm of unspecified part of right bronchus or lung: Secondary | ICD-10-CM

## 2016-11-15 DIAGNOSIS — I4891 Unspecified atrial fibrillation: Secondary | ICD-10-CM

## 2016-11-15 DIAGNOSIS — R590 Localized enlarged lymph nodes: Secondary | ICD-10-CM | POA: Diagnosis not present

## 2016-11-15 DIAGNOSIS — R918 Other nonspecific abnormal finding of lung field: Secondary | ICD-10-CM | POA: Diagnosis not present

## 2016-11-15 DIAGNOSIS — I10 Essential (primary) hypertension: Secondary | ICD-10-CM | POA: Diagnosis not present

## 2016-11-15 DIAGNOSIS — K769 Liver disease, unspecified: Secondary | ICD-10-CM | POA: Diagnosis not present

## 2016-11-15 LAB — PROTIME-INR
INR: 2.39
Prothrombin Time: 26.5 seconds — ABNORMAL HIGH (ref 11.4–15.2)

## 2016-11-15 LAB — COMPREHENSIVE METABOLIC PANEL
ALBUMIN: 2.8 g/dL — AB (ref 3.5–5.0)
ALK PHOS: 58 U/L (ref 38–126)
ALT: 17 U/L (ref 14–54)
ANION GAP: 8 (ref 5–15)
AST: 13 U/L — ABNORMAL LOW (ref 15–41)
BUN: 16 mg/dL (ref 6–20)
CALCIUM: 8.8 mg/dL — AB (ref 8.9–10.3)
CHLORIDE: 112 mmol/L — AB (ref 101–111)
CO2: 25 mmol/L (ref 22–32)
Creatinine, Ser: 0.81 mg/dL (ref 0.44–1.00)
GFR calc non Af Amer: >60 mL/min (ref >60)
GLUCOSE: 101 mg/dL — AB (ref 65–99)
POTASSIUM: 3.9 mmol/L (ref 3.5–5.1)
SODIUM: 145 mmol/L (ref 135–145)
Total Bilirubin: 0.4 mg/dL (ref 0.3–1.2)
Total Protein: 5.9 g/dL — ABNORMAL LOW (ref 6.5–8.1)

## 2016-11-15 LAB — CBC WITH DIFFERENTIAL/PLATELET
BASOS PCT: 0 %
Basophils Absolute: 0 10*3/uL (ref 0.0–0.1)
EOS ABS: 0 10*3/uL (ref 0.0–0.7)
EOS PCT: 0 %
HCT: 30.4 % — ABNORMAL LOW (ref 36.0–46.0)
Hemoglobin: 10.4 g/dL — ABNORMAL LOW (ref 12.0–15.0)
LYMPHS ABS: 0.4 10*3/uL — AB (ref 0.7–4.0)
Lymphocytes Relative: 11 %
MCH: 30.2 pg (ref 26.0–34.0)
MCHC: 34.2 g/dL (ref 30.0–36.0)
MCV: 88.4 fL (ref 78.0–100.0)
MONO ABS: 0.4 10*3/uL (ref 0.1–1.0)
MONOS PCT: 11 %
NEUTROS PCT: 78 %
Neutro Abs: 3 10*3/uL (ref 1.7–7.7)
PLATELETS: 225 10*3/uL (ref 150–400)
RBC: 3.44 MIL/uL — ABNORMAL LOW (ref 3.87–5.11)
RDW: 15.1 % (ref 11.5–15.5)
WBC: 3.9 10*3/uL — ABNORMAL LOW (ref 4.0–10.5)

## 2016-11-15 MED ORDER — SODIUM CHLORIDE 0.9 % IV SOLN
134.8000 mg | Freq: Once | INTRAVENOUS | Status: AC
  Administered 2016-11-15: 130 mg via INTRAVENOUS
  Filled 2016-11-15: qty 13

## 2016-11-15 MED ORDER — FULVESTRANT 250 MG/5ML IM SOLN
INTRAMUSCULAR | Status: AC
  Filled 2016-11-15: qty 10

## 2016-11-15 MED ORDER — PALONOSETRON HCL INJECTION 0.25 MG/5ML
0.2500 mg | Freq: Once | INTRAVENOUS | Status: AC
  Administered 2016-11-15: 0.25 mg via INTRAVENOUS
  Filled 2016-11-15: qty 5

## 2016-11-15 MED ORDER — SODIUM CHLORIDE 0.9 % IV SOLN
Freq: Once | INTRAVENOUS | Status: AC
  Administered 2016-11-15: 11:00:00 via INTRAVENOUS

## 2016-11-15 MED ORDER — HEPARIN SOD (PORK) LOCK FLUSH 100 UNIT/ML IV SOLN
500.0000 [IU] | Freq: Once | INTRAVENOUS | Status: AC | PRN
  Administered 2016-11-15: 500 [IU]
  Filled 2016-11-15: qty 5

## 2016-11-15 MED ORDER — FAMOTIDINE IN NACL 20-0.9 MG/50ML-% IV SOLN
20.0000 mg | Freq: Once | INTRAVENOUS | Status: AC
  Administered 2016-11-15: 20 mg via INTRAVENOUS
  Filled 2016-11-15: qty 50

## 2016-11-15 MED ORDER — DEXAMETHASONE SODIUM PHOSPHATE 100 MG/10ML IJ SOLN
20.0000 mg | Freq: Once | INTRAMUSCULAR | Status: AC
  Administered 2016-11-15: 20 mg via INTRAVENOUS
  Filled 2016-11-15: qty 2

## 2016-11-15 MED ORDER — SODIUM CHLORIDE 0.9% FLUSH
10.0000 mL | INTRAVENOUS | Status: DC | PRN
  Administered 2016-11-15: 10 mL
  Filled 2016-11-15: qty 10

## 2016-11-15 MED ORDER — DIPHENHYDRAMINE HCL 50 MG/ML IJ SOLN
12.5000 mg | Freq: Once | INTRAMUSCULAR | Status: AC
  Administered 2016-11-15: 12.5 mg via INTRAVENOUS
  Filled 2016-11-15: qty 1

## 2016-11-15 MED ORDER — DEXTROSE 5 % IV SOLN
45.0000 mg/m2 | Freq: Once | INTRAVENOUS | Status: AC
  Administered 2016-11-15: 72 mg via INTRAVENOUS
  Filled 2016-11-15: qty 12

## 2016-11-15 NOTE — Progress Notes (Signed)
Patient reports not taking zofran for her nausea over the weekend because she was afraid of constipation. She states that she took compazine and that helped.  She denies any nausea today.

## 2016-11-15 NOTE — Patient Instructions (Signed)
[facility] Discharge Instructions for Patients Receiving Chemotherapy    If you have a lab appointment with the [facility] please come in thru the  Main Entrance and check in at the main information desk  You can take Delsym for the cough.  Today you received the following chemotherapy agents: Taxol, Carboplatin  To help prevent nausea and vomiting after your treatment, we encourage you to take your nausea medication as prescribed.   If you develop nausea and vomiting, or diarrhea that is not controlled by your medication, call the clinic.  The clinic phone number is [phone]. Office hours are Monday-Friday 8:30am-5:00pm.  BELOW ARE SYMPTOMS THAT SHOULD BE REPORTED IMMEDIATELY:  *FEVER GREATER THAN 101.0 F  *CHILLS WITH OR WITHOUT FEVER  NAUSEA AND VOMITING THAT IS NOT CONTROLLED WITH YOUR NAUSEA MEDICATION  *UNUSUAL SHORTNESS OF BREATH  *UNUSUAL BRUISING OR BLEEDING  TENDERNESS IN MOUTH AND THROAT WITH OR WITHOUT PRESENCE OF ULCERS  *URINARY PROBLEMS  *BOWEL PROBLEMS  UNUSUAL RASH Items with * indicate a potential emergency and should be followed up as soon as possible. If you have an emergency after office hours please contact your primary care physician or go to the nearest emergency department.  Please call the clinic during office hours if you have any questions or concerns.   You may also contact the Patient Navigator at [phone] should you have any questions or need assistance in obtaining follow up care.      Resources For Cancer Patients and their Caregivers ? American Cancer Society: Can assist with transportation, wigs, general needs, runs Look Good Feel Better.        330-474-9885 ? Cancer Care: Provides financial assistance, online support groups, medication/co-pay assistance.  1-800-813-HOPE 403-418-0380) ? [facility] Assists [place] cancer patients and their families through emotional ,  educational and financial support.  947-021-1220 ? [place] DSS Where to apply for food stamps, Medicaid and utility assistance. 647-426-7094 ? [employer]: Transportation to medical appointments. 732-171-3545 ? Social Security Administration: May apply for disability if have a Stage IV cancer. [phone] 646-474-0442 ? [employer], Disability and Transit Services: Assists with nutrition, care and transit needs. [phone]

## 2016-11-15 NOTE — Progress Notes (Signed)
[name], [place] [address]  Squamous cell carcinoma of right lung [facility]) - Plan: CT Chest W Contrast, CT Abdomen Pelvis W Contrast, CANCELED: CT Chest Wo Contrast  CURRENT THERAPY:  Weekly carboplatin/paclitaxel with XRT.  INTERVAL HISTORY: Alexandria Chen 82 y.o. 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    Squamous cell carcinoma of right lung (HCC)   06/25/2016 Imaging    CT chest- 1. 4.9 x 3.3 cm mass in the medial aspect of the right upper lobe which demonstrates evidence of potential direct mediastinal invasion, and pathologically enlarged right hilar and right paratracheal lymph nodes which suggest nodal metastatic disease. These findings are highly concerning for primary bronchogenic carcinoma and further evaluation with PET-CT is strongly recommended in the near future for further diagnostic and staging purposes. 2. There is also nodularity in the adrenal glands bilaterally, which could reflect additional sites of metastatic disease. Attention at time of forthcoming PET-CT is recommended. 3. Indeterminate lesion in the right lobe of the liver, favored to represent a cavernous hemangioma. Should this lesion prove to be hypermetabolic on PET imaging, further characterization with MRI of the abdomen with and without IV gadolinium could provide definitive characterization. 4. Aortic atherosclerosis, in addition to left main and 2 vessel coronary artery disease. Assessment for potential risk factor modification, dietary therapy or pharmacologic therapy may be warranted, if clinically indicated. 5. Mild diffuse bronchial wall thickening with mild centrilobular and paraseptal emphysema; imaging findings suggestive of underlying COPD. 6. Additional incidental findings, as above.      07/06/2016 Imaging    CT head wo contrast- 1. No acute finding. 2. Advanced chronic microvascular  disease.      07/07/2016 PET scan    4.7 cm mass in the medial right upper lobe abutting/invading the mediastinum, suspicious for primary bronchogenic neoplasm.  Possible mild mediastinal/right hilar nodal metastases, equivocal.  No evidence of distant metastases.  3.6 x 2.3 cm subcapsular lesion in the inferior right liver is non FDG avid, likely reflecting a benign hemangioma when correlating with prior CT.      08/27/2016 Procedure    Needle core biopsy of the anterior right upper lobe mass by interventional radiology      08/30/2016 Pathology Results    Squamous cell carcinoma      10/04/2016 -  Chemotherapy    Carboplatin/Paclitaxel weekly with XRT       10/04/2016 -  Radiation Therapy    XRT in [place], [place].  Final XRT planned for 11/17/2016        HPI Elements   Location: Right lung  Quality: Squamous cell carcinoma  Severity: Stage IIIB  Duration: Dx on 08/27/2016  Context:   Timing:   Modifying Factors:   Associated Signs & Symptoms:     She is tolerating treatment well.  She notes some fatigue.  She notes a decrease in energy and tiredness.  She reports episodes of constipation and soft stools.  She denies any diarrhea.  She reports radiation esophagitis with trouble swallowing.  Her weight has remained stable throughout treatment and she is down approximately 2 pounds since starting treatment.  She is supplementing her diet with Ensure daily.  She rates her appetite is 75%.  She rates her energy level 25%.  She denies any new pains.  Review of Systems  Constitutional: Positive for malaise/fatigue. Negative for chills, fever and weight loss.  HENT:  Negative.   Eyes: Negative.   Respiratory: Negative.  Negative for cough.   Cardiovascular: Negative.  Negative for chest pain.  Gastrointestinal: Positive for constipation and diarrhea. Negative for blood in stool, melena, nausea and vomiting.  Genitourinary: Negative.   Musculoskeletal: Negative.   Skin:  Negative.   Neurological: Positive for dizziness. Negative for weakness.  Endo/Heme/Allergies: Negative.   Psychiatric/Behavioral: Negative.     Past Medical History:  Diagnosis Date  . Alcohol abuse, in remission   . Anxiety   . Atrial fibrillation ([place])   . Depression   . Dyspnea    walking distances   . Dysrhythmia   . History of bronchitis   . HTN (hypertension)   . Mass of right lung 07/13/2016  . Osteopenia   . Squamous cell carcinoma of right lung ([place]) 07/13/2016    Past Surgical History:  Procedure Laterality Date  . IR FLUORO GUIDE PORT INSERTION RIGHT  09/17/2016  . IR US GUIDE VASC ACCESS RIGHT  09/17/2016  . NO PAST SURGERIES      Family History  Problem Relation Age of Onset  . Anxiety disorder Mother   . Dementia Mother   . Hypertension Mother   . Alcohol abuse Father   . Arthritis Father   . Alcohol abuse Brother   . ADD / ADHD Neg Hx   . Drug abuse Neg Hx   . Bipolar disorder Neg Hx   . Depression Neg Hx   . OCD Neg Hx   . Paranoid behavior Neg Hx   . Schizophrenia Neg Hx   . Seizures Neg Hx   . Sexual abuse Neg Hx   . Physical abuse Neg Hx     Social History   Social History  . Marital status: Widowed    Spouse name: N/A  . Number of children: N/A  . Years of education: N/A   Social History Main Topics  . Smoking status: Former Smoker    Packs/day: 2.00    Years: 60.00    Types: Cigarettes    Start date: 05/03/1956    Quit date: 07/15/2016  . Smokeless tobacco: Never Used  . Alcohol use No     Comment: Patient reports no alcohol use since 05/24/2016.  . Drug use: No  . Sexual activity: No   Other Topics Concern  . Not on file   Social History Narrative  . No narrative on file     PHYSICAL EXAMINATION  ECOG PERFORMANCE STATUS: 1 - Symptomatic but completely ambulatory  There were no vitals filed for this visit.  Vitals - 1 value per visit 3/54/6568  SYSTOLIC 127  DIASTOLIC 63  Pulse 81  Temperature 98.8  Respirations 16    Weight (lb) 132.5    GENERAL:alert, no distress, well nourished, well developed, comfortable, cooperative, smiling and in chemo-bed, accompanied by daughter. SKIN: skin color, texture, turgor are normal, no rashes or significant lesions, positive for: mild erythema at port sites (XRT) HEAD: Normocephalic, No masses, lesions, tenderness or abnormalities EYES: normal, EOMI, Conjunctiva are pink and non-injected EARS: External ears normal OROPHARYNX:lips, buccal mucosa, and tongue normal and mucous membranes are moist  NECK: supple, trachea midline LYMPH:  no palpable lymphadenopathy BREAST:not examined LUNGS: clear to auscultation  HEART: regular rate & rhythm ABDOMEN:abdomen soft and normal bowel sounds BACK: Back symmetric, no curvature. EXTREMITIES:less then 2 second capillary refill, no joint deformities, effusion, or inflammation, no skin discoloration, no cyanosis  NEURO: alert & oriented x 3 with fluent speech, no  focal motor/sensory deficits   LABORATORY DATA: CBC    Component Value Date/Time   WBC 3.9 (L) 11/15/2016 0844   RBC 3.44 (L) 11/15/2016 0844   HGB 10.4 (L) 11/15/2016 0844   HGB 12.0 07/19/2016 1519   HCT 30.4 (L) 11/15/2016 0844   HCT 35.6 07/19/2016 1519   PLT 225 11/15/2016 0844   PLT 358 07/19/2016 1519   MCV 88.4 11/15/2016 0844   MCV 89 07/19/2016 1519   MCH 30.2 11/15/2016 0844   MCHC 34.2 11/15/2016 0844   RDW 15.1 11/15/2016 0844   RDW 14.1 07/19/2016 1519   LYMPHSABS 0.4 (L) 11/15/2016 0844   LYMPHSABS 2.3 07/19/2016 1519   MONOABS 0.4 11/15/2016 0844   EOSABS 0.0 11/15/2016 0844   EOSABS 0.1 07/19/2016 1519   BASOSABS 0.0 11/15/2016 0844   BASOSABS 0.0 07/19/2016 1519      Chemistry      Component Value Date/Time   NA 145 11/15/2016 0844   NA 138 07/19/2016 1519   K 3.9 11/15/2016 0844   CL 112 (H) 11/15/2016 0844   CO2 25 11/15/2016 0844   BUN 16 11/15/2016 0844   BUN 9 07/19/2016 1519   CREATININE 0.81 11/15/2016 0844       Component Value Date/Time   CALCIUM 8.8 (L) 11/15/2016 0844   ALKPHOS 58 11/15/2016 0844   AST 13 (L) 11/15/2016 0844   ALT 17 11/15/2016 0844   BILITOT 0.4 11/15/2016 0844   BILITOT 0.3 07/19/2016 1519        PENDING LABS:   RADIOGRAPHIC STUDIES:  No results found.   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  Currently undergoing concomitant chemoXRT with weekly Carboplatin/Paclitaxel.  Pre-treatment labs today: CBC diff, CMET.  I personally reviewed and went over laboratory results with the patient.  The results are noted within this dictation.  Will draw PT/INR today as well.  Labs satisfy treatment parameters.  Nursing, in accordance with chemotherapy administration protocol, will monitor for acute side effects/toxicities associated with chemotherapy administration today.  Will restage patient with CT CAP with contrast in 4 weeks.   Order is placed for CT CAP w contrast.  Patient is a candidate for consolidative Durvalumab immunotherapy in accordance with NCCN guidelines.  I have discussed information regarding consolidative immunotherapy, namely Imfinzi given recent data: In a phase III trial, over 700 patients with unresectable stage III NSCLC without progression after at least two cycles of platinum-based chemoradiotherapy were randomly assigned to the programmed death ligand 1 (PD-L1) antibody durvalumab or placebo in a 2:1 ratio. Relative to placebo, durvalumab increased the median PFS (16.8 versus 5.6 months; HR for disease progression or death 0.52, 95% CI 0.42-0.65), response rate (28 versus 16 percent; relative risk [RR] 1.78, 95% CI 1.27-2.51), and median time to death or distant metastasis (23.2 versus 14.6 months; HR 0.52, 95% CI 0.39-0.69). The benefit in PFS with durvalumab was observed irrespective of PD-L1 expression before chemoradiotherapy and was evident in both  smokers and nonsmokers. OS results are immature. Grade 3 or 4 adverse events occurred in 30 percent of the patients who received durvalumab and 26 percent of those who received placebo, with the most common severe adverse event being pneumonia. These results demonstrate efficacy and tolerability of durvalumab for treatment of unresectable stage III NSCLC in patients who experience an objective response or stable disease following completion of chemoradiotherapy.  Based upon radiographic response  to definitive therapy, will further discuss consolidative therapy.  Will refer patient back to her primary care provider for ongoing INR monitoring between medical oncology appointments.  INR today is perfect at 2.39.  Return in 4 weeks for follow-up and review of restaging CT scans with medical oncology recommendations to follow.  ORDERS PLACED FOR THIS ENCOUNTER: Orders Placed This Encounter  Procedures  . CT Chest W Contrast  . CT Abdomen Pelvis W Contrast    MEDICATIONS PRESCRIBED THIS ENCOUNTER: No orders of the defined types were placed in this encounter.   THERAPY PLAN:  Complete chemotherapy today, and finish XRT on 11/17/2016.  Will restage in 4 weeks and discuss consolidative therapy following, based upon response to therapy.  All questions were answered. The patient knows to call the clinic with any problems, questions or concerns. We can certainly see the patient much sooner if necessary.  Patient and plan discussed with Dr. Twana First and she is in agreement with the aforementioned.   This note is electronically signed by: Doy Mince 11/15/2016 9:50 AM

## 2016-11-15 NOTE — Progress Notes (Signed)
Patient tolerated treatment without difficulty. Patient discharged in stable condition via wheelchair with daughter. Patient to follow up as scheduled.

## 2016-11-15 NOTE — Patient Instructions (Signed)
[place] at [facility] Discharge Instructions  RECOMMENDATIONS MADE BY THE CONSULTANT AND ANY TEST RESULTS WILL BE SENT TO YOUR REFERRING PHYSICIAN.  You were seen today by Kirby Crigler PA-C. Treatment today. Diarrhea and nausea sheets given. Restaging CT in 4 weeks. Return in 4 weeks for follow up.   Thank you for choosing [place] at [facility] to provide your oncology and hematology care.  To afford each patient quality time with our provider, please arrive at least 15 minutes before your scheduled appointment time.    If you have a lab appointment with the [facility] please come in thru the  Main Entrance and check in at the main information desk  You need to re-schedule your appointment should you arrive 10 or more minutes late.  We strive to give you quality time with our providers, and arriving late affects you and other patients whose appointments are after yours.  Also, if you no show three or more times for appointments you may be dismissed from the clinic at the providers discretion.     Again, thank you for choosing [facility].  Our hope is that these requests will decrease the amount of time that you wait before being seen by our physicians.       _____________________________________________________________  Should you have questions after your visit to [facility], please contact our office at [phone] between the hours of 8:30 a.m. and 4:30 p.m.  Voicemails left after 4:30 p.m. will not be returned until the following business day.  For prescription refill requests, have your pharmacy contact our office.       Resources For Cancer Patients and their Caregivers ? American Cancer Society: Can assist with transportation, wigs, general needs, runs Look Good Feel Better.        401-781-4801 ? Cancer Care: Provides financial assistance, online support groups, medication/co-pay  assistance.  1-800-813-HOPE 3525934627) ? [facility] Assists [place] cancer patients and their families through emotional , educational and financial support.  ([phone] ? [place] DSS Where to apply for food stamps, Medicaid and utility assistance. 3064813206 ? [employer]: Transportation to medical appointments. [phone] ? Social Security Administration: May apply for disability if have a Stage IV cancer. [phone] ? [employer], Disability and Transit Services: Assists with nutrition, care and transit needs. [place] Support Programs: @10RELATIVEDAYS @ > Cancer Support Group  2nd Tuesday of the month 1pm-2pm, Journey Room  > Creative Journey  3rd Tuesday of the month 1130am-1pm, Journey Room  > Look Good Feel Better  1st Wednesday of the month 10am-12 noon, Journey Room (Call [facility] to register [phone])

## 2016-11-15 NOTE — Assessment & Plan Note (Addendum)
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  Currently undergoing concomitant chemoXRT with weekly Carboplatin/Paclitaxel.  Pre-treatment labs today: CBC diff, CMET.  I personally reviewed and went over laboratory results with the patient.  The results are noted within this dictation.  Will draw PT/INR today as well.  Labs satisfy treatment parameters.  Nursing, in accordance with chemotherapy administration protocol, will monitor for acute side effects/toxicities associated with chemotherapy administration today.  Will restage patient with CT CAP with contrast in 4 weeks.   Order is placed for CT CAP w contrast.  Patient is a candidate for consolidative Durvalumab immunotherapy in accordance with NCCN guidelines.  I have discussed information regarding consolidative immunotherapy, namely Imfinzi given recent data: In a phase III trial, over 700 patients with unresectable stage III NSCLC without progression after at least two cycles of platinum-based chemoradiotherapy were randomly assigned to the programmed death ligand 1 (PD-L1) antibody durvalumab or placebo in a 2:1 ratio. Relative to placebo, durvalumab increased the median PFS (16.8 versus 5.6 months; HR for disease progression or death 0.52, 95% CI 0.42-0.65), response rate (28 versus 16 percent; relative risk [RR] 1.78, 95% CI 1.27-2.51), and median time to death or distant metastasis (23.2 versus 14.6 months; HR 0.52, 95% CI 0.39-0.69). The benefit in PFS with durvalumab was observed irrespective of PD-L1 expression before chemoradiotherapy and was evident in both smokers and nonsmokers. OS results are immature. Grade 3 or 4 adverse events occurred in 30 percent of the patients who received durvalumab and 26 percent of those who received placebo, with the most common severe adverse event being pneumonia. These results demonstrate efficacy and tolerability of durvalumab for treatment of unresectable stage III NSCLC in patients who  experience an objective response or stable disease following completion of chemoradiotherapy.  Based upon radiographic response to definitive therapy, will further discuss consolidative therapy.  Will refer patient back to her primary care provider for ongoing INR monitoring between medical oncology appointments.  INR today is perfect at 2.39.  Return in 4 weeks for follow-up and review of restaging CT scans with medical oncology recommendations to follow.

## 2016-11-16 DIAGNOSIS — J449 Chronic obstructive pulmonary disease, unspecified: Secondary | ICD-10-CM | POA: Diagnosis not present

## 2016-11-16 DIAGNOSIS — R590 Localized enlarged lymph nodes: Secondary | ICD-10-CM | POA: Diagnosis not present

## 2016-11-16 DIAGNOSIS — I10 Essential (primary) hypertension: Secondary | ICD-10-CM | POA: Diagnosis not present

## 2016-11-16 DIAGNOSIS — C3411 Malignant neoplasm of upper lobe, right bronchus or lung: Secondary | ICD-10-CM | POA: Diagnosis not present

## 2016-11-16 DIAGNOSIS — I4891 Unspecified atrial fibrillation: Secondary | ICD-10-CM | POA: Diagnosis not present

## 2016-11-16 DIAGNOSIS — K769 Liver disease, unspecified: Secondary | ICD-10-CM | POA: Diagnosis not present

## 2016-11-17 ENCOUNTER — Other Ambulatory Visit (HOSPITAL_COMMUNITY): Payer: Self-pay | Admitting: Psychiatry

## 2016-11-17 DIAGNOSIS — I480 Paroxysmal atrial fibrillation: Secondary | ICD-10-CM | POA: Diagnosis not present

## 2016-11-17 DIAGNOSIS — R590 Localized enlarged lymph nodes: Secondary | ICD-10-CM | POA: Diagnosis not present

## 2016-11-17 DIAGNOSIS — I10 Essential (primary) hypertension: Secondary | ICD-10-CM | POA: Diagnosis not present

## 2016-11-17 DIAGNOSIS — I4891 Unspecified atrial fibrillation: Secondary | ICD-10-CM | POA: Diagnosis not present

## 2016-11-17 DIAGNOSIS — Z87891 Personal history of nicotine dependence: Secondary | ICD-10-CM | POA: Diagnosis not present

## 2016-11-17 DIAGNOSIS — K769 Liver disease, unspecified: Secondary | ICD-10-CM | POA: Diagnosis not present

## 2016-11-17 DIAGNOSIS — J449 Chronic obstructive pulmonary disease, unspecified: Secondary | ICD-10-CM | POA: Diagnosis not present

## 2016-11-17 DIAGNOSIS — Z7982 Long term (current) use of aspirin: Secondary | ICD-10-CM | POA: Diagnosis not present

## 2016-11-17 DIAGNOSIS — C3411 Malignant neoplasm of upper lobe, right bronchus or lung: Secondary | ICD-10-CM | POA: Diagnosis not present

## 2016-11-17 DIAGNOSIS — Z7901 Long term (current) use of anticoagulants: Secondary | ICD-10-CM | POA: Diagnosis not present

## 2016-11-20 ENCOUNTER — Encounter (HOSPITAL_COMMUNITY): Payer: Self-pay | Admitting: Emergency Medicine

## 2016-11-20 ENCOUNTER — Ambulatory Visit: Payer: Medicare Other | Admitting: Family Medicine

## 2016-11-20 ENCOUNTER — Telehealth: Payer: Self-pay | Admitting: Family Medicine

## 2016-11-20 ENCOUNTER — Emergency Department (HOSPITAL_COMMUNITY): Payer: Medicare Other

## 2016-11-20 ENCOUNTER — Observation Stay (HOSPITAL_COMMUNITY)
Admission: EM | Admit: 2016-11-20 | Discharge: 2016-11-21 | Disposition: A | Discharge status: Home / Self Care | Payer: Medicare Other | Attending: Internal Medicine | Admitting: Internal Medicine

## 2016-11-20 ENCOUNTER — Encounter: Payer: Self-pay | Admitting: *Deleted

## 2016-11-20 ENCOUNTER — Other Ambulatory Visit: Payer: Self-pay

## 2016-11-20 DIAGNOSIS — R404 Transient alteration of awareness: Secondary | ICD-10-CM | POA: Diagnosis not present

## 2016-11-20 DIAGNOSIS — Z7901 Long term (current) use of anticoagulants: Secondary | ICD-10-CM | POA: Insufficient documentation

## 2016-11-20 DIAGNOSIS — I10 Essential (primary) hypertension: Secondary | ICD-10-CM | POA: Diagnosis not present

## 2016-11-20 DIAGNOSIS — R55 Syncope and collapse: Principal | ICD-10-CM

## 2016-11-20 DIAGNOSIS — C3491 Malignant neoplasm of unspecified part of right bronchus or lung: Secondary | ICD-10-CM | POA: Insufficient documentation

## 2016-11-20 DIAGNOSIS — Z79899 Other long term (current) drug therapy: Secondary | ICD-10-CM | POA: Diagnosis not present

## 2016-11-20 DIAGNOSIS — R0981 Nasal congestion: Secondary | ICD-10-CM | POA: Insufficient documentation

## 2016-11-20 DIAGNOSIS — C3411 Malignant neoplasm of upper lobe, right bronchus or lung: Secondary | ICD-10-CM

## 2016-11-20 DIAGNOSIS — Z87891 Personal history of nicotine dependence: Secondary | ICD-10-CM | POA: Insufficient documentation

## 2016-11-20 DIAGNOSIS — R05 Cough: Secondary | ICD-10-CM | POA: Insufficient documentation

## 2016-11-20 DIAGNOSIS — R630 Anorexia: Secondary | ICD-10-CM | POA: Insufficient documentation

## 2016-11-20 DIAGNOSIS — I4891 Unspecified atrial fibrillation: Secondary | ICD-10-CM

## 2016-11-20 DIAGNOSIS — S0990XA Unspecified injury of head, initial encounter: Secondary | ICD-10-CM | POA: Diagnosis not present

## 2016-11-20 DIAGNOSIS — Z7982 Long term (current) use of aspirin: Secondary | ICD-10-CM | POA: Insufficient documentation

## 2016-11-20 DIAGNOSIS — R42 Dizziness and giddiness: Secondary | ICD-10-CM | POA: Insufficient documentation

## 2016-11-20 LAB — BASIC METABOLIC PANEL
Anion gap: 8 (ref 5–15)
BUN: 19 mg/dL (ref 6–20)
CO2: 26 mmol/L (ref 22–32)
CREATININE: 1.06 mg/dL — AB (ref 0.44–1.00)
Calcium: 8.9 mg/dL (ref 8.9–10.3)
Chloride: 103 mmol/L (ref 101–111)
GFR calc Af Amer: 56 mL/min — ABNORMAL LOW (ref >60)
GFR, EST NON AFRICAN AMERICAN: 48 mL/min — AB (ref >60)
GLUCOSE: 136 mg/dL — AB (ref 65–99)
POTASSIUM: 3.8 mmol/L (ref 3.5–5.1)
Sodium: 137 mmol/L (ref 135–145)

## 2016-11-20 LAB — CBC
HCT: 31.9 % — ABNORMAL LOW (ref 36.0–46.0)
Hemoglobin: 10.8 g/dL — ABNORMAL LOW (ref 12.0–15.0)
MCH: 30.2 pg (ref 26.0–34.0)
MCHC: 33.9 g/dL (ref 30.0–36.0)
MCV: 89.1 fL (ref 78.0–100.0)
Platelets: 201 K/uL (ref 150–400)
RBC: 3.58 MIL/uL — ABNORMAL LOW (ref 3.87–5.11)
RDW: 15.4 % (ref 11.5–15.5)
WBC: 3.3 K/uL — ABNORMAL LOW (ref 4.0–10.5)

## 2016-11-20 LAB — DIFFERENTIAL
BASOS ABS: 0 10*3/uL (ref 0.0–0.1)
BASOS PCT: 0 %
EOS ABS: 0 10*3/uL (ref 0.0–0.7)
Eosinophils Relative: 0 %
Lymphocytes Relative: 8 %
Lymphs Abs: 0.3 10*3/uL — ABNORMAL LOW (ref 0.7–4.0)
Monocytes Absolute: 0.2 10*3/uL (ref 0.1–1.0)
Monocytes Relative: 5 %
NEUTROS PCT: 87 %
Neutro Abs: 2.9 10*3/uL (ref 1.7–7.7)

## 2016-11-20 LAB — URINALYSIS, ROUTINE W REFLEX MICROSCOPIC
BACTERIA UA: NONE SEEN
Bilirubin Urine: NEGATIVE
Glucose, UA: NEGATIVE mg/dL
Hgb urine dipstick: NEGATIVE
Ketones, ur: NEGATIVE mg/dL
Leukocytes, UA: NEGATIVE
Nitrite: NEGATIVE
PROTEIN: 30 mg/dL — AB
SQUAMOUS EPITHELIAL / LPF: NONE SEEN
Specific Gravity, Urine: 1.019 (ref 1.005–1.030)
pH: 5 (ref 5.0–8.0)

## 2016-11-20 LAB — PROTIME-INR
INR: 2.12
PROTHROMBIN TIME: 24.1 s — AB (ref 11.4–15.2)

## 2016-11-20 LAB — CBG MONITORING, ED: GLUCOSE-CAPILLARY: 108 mg/dL — AB (ref 65–99)

## 2016-11-20 MED ORDER — ATORVASTATIN CALCIUM 40 MG PO TABS: 40.0000 mg | ORAL_TABLET | Freq: Every day | ORAL | Status: DC

## 2016-11-20 MED ORDER — ASPIRIN EC 81 MG PO TBEC
81.0000 mg | DELAYED_RELEASE_TABLET | Freq: Every day | ORAL | Status: DC
  Administered 2016-11-21: 81 mg via ORAL
  Filled 2016-11-20: qty 1

## 2016-11-20 MED ORDER — MECLIZINE HCL 12.5 MG PO TABS: 25.0000 mg | ORAL_TABLET | Freq: Three times a day (TID) | ORAL | Status: DC | PRN

## 2016-11-20 MED ORDER — PANTOPRAZOLE SODIUM 40 MG PO TBEC
40.0000 mg | DELAYED_RELEASE_TABLET | Freq: Every day | ORAL | Status: DC
  Administered 2016-11-21: 40 mg via ORAL
  Filled 2016-11-20: qty 1

## 2016-11-20 MED ORDER — WARFARIN - PHYSICIAN DOSING INPATIENT: Freq: Every day | Status: DC

## 2016-11-20 MED ORDER — CALCIUM CARBONATE-VITAMIN D 500-200 MG-UNIT PO TABS
1.0000 | ORAL_TABLET | Freq: Two times a day (BID) | ORAL | Status: DC
  Administered 2016-11-20 – 2016-11-21 (×2): 1 via ORAL
  Filled 2016-11-20 (×2): qty 1

## 2016-11-20 MED ORDER — ONDANSETRON HCL 4 MG/2ML IJ SOLN: 4.0000 mg | Freq: Four times a day (QID) | INTRAMUSCULAR | Status: DC | PRN

## 2016-11-20 MED ORDER — ONDANSETRON HCL 4 MG PO TABS: 4.0000 mg | ORAL_TABLET | Freq: Four times a day (QID) | ORAL | Status: DC | PRN

## 2016-11-20 MED ORDER — DOCUSATE SODIUM 100 MG PO CAPS
100.0000 mg | ORAL_CAPSULE | Freq: Every day | ORAL | Status: DC
  Administered 2016-11-20 – 2016-11-21 (×2): 100 mg via ORAL
  Filled 2016-11-20 (×2): qty 1

## 2016-11-20 MED ORDER — GABAPENTIN 100 MG PO CAPS
100.0000 mg | ORAL_CAPSULE | Freq: Every day | ORAL | Status: DC
  Administered 2016-11-20: 100 mg via ORAL
  Filled 2016-11-20: qty 1

## 2016-11-20 MED ORDER — ACETAMINOPHEN 650 MG RE SUPP: 650.0000 mg | Freq: Four times a day (QID) | RECTAL | Status: DC | PRN

## 2016-11-20 MED ORDER — WARFARIN SODIUM 5 MG PO TABS: 2.5000 mg | ORAL_TABLET | Freq: Every evening | ORAL | Status: DC

## 2016-11-20 MED ORDER — SODIUM CHLORIDE 0.9 % IV BOLUS (SEPSIS)
1000.0000 mL | Freq: Once | INTRAVENOUS | Status: AC
  Administered 2016-11-20: 1000 mL via INTRAVENOUS

## 2016-11-20 MED ORDER — SODIUM CHLORIDE 0.9% FLUSH: 3.0000 mL | Freq: Two times a day (BID) | INTRAVENOUS | Status: DC

## 2016-11-20 MED ORDER — ACETAMINOPHEN 325 MG PO TABS: 650.0000 mg | ORAL_TABLET | Freq: Four times a day (QID) | ORAL | Status: DC | PRN

## 2016-11-20 MED ORDER — AMIODARONE HCL 200 MG PO TABS
200.0000 mg | ORAL_TABLET | Freq: Every day | ORAL | Status: DC
  Administered 2016-11-21: 200 mg via ORAL
  Filled 2016-11-20: qty 1

## 2016-11-20 MED ORDER — BUSPIRONE HCL 15 MG PO TABS
15.0000 mg | ORAL_TABLET | Freq: Two times a day (BID) | ORAL | Status: DC
  Administered 2016-11-20 – 2016-11-21 (×2): 15 mg via ORAL
  Filled 2016-11-20: qty 3
  Filled 2016-11-20: qty 1

## 2016-11-20 MED ORDER — DEXTROSE-NACL 5-0.9 % IV SOLN
INTRAVENOUS | Status: DC
  Administered 2016-11-20 – 2016-11-21 (×2): via INTRAVENOUS

## 2016-11-20 MED ORDER — DULOXETINE HCL 30 MG PO CPEP
60.0000 mg | ORAL_CAPSULE | Freq: Every day | ORAL | Status: DC
  Administered 2016-11-21: 60 mg via ORAL
  Filled 2016-11-20: qty 2

## 2016-11-20 NOTE — H&P (Signed)
History and Physical    Alexandria Chen IOX:[id number] DOB: 1934/09/08 DOA: 11/20/2016  PCP: [name], FNP   Patient coming from: Home    Chief Complaint:  Syncope   HPI: Alexandria Chen is a 82 y.o. female with medical history significant of recent diagnosis of atrial fibrillation, on anticoagulation, lung cancer status post radiation and chemotherapy. Over last week patient has been developing generalized weakness, decreased balance, poor appetite. She sustained a mechanical fall about 48 hours ago, developing ecchymosis on her right shoulder. Due to her overall deconditioning, and recent fall patient was being taken to her primary care provider as a sick visit. While she was waiting to be picked out, she collapsed from her own height, apparently she been standing for about 5 minutes, reports dizziness and lightheadedness before the event, it took about 10 minutes to recover fully her consciousness. The episode was witnessed, no tongue biting or loss of continence. Mild confusion positive syncope. Patient was brought into the hospital for further evaluation.   The syncope was prolonged in timing, no apparent triggering factors, no worsening factors, associated with generalized weakness, dizziness and lightheadedness.  Last chemotherapy/radiation was about a week ago.   ED Course: Initial workup has been negative. Patient referred for hospitalization, observation.  Review of Systems:  1. General positive for malaise, decreased level of energy, poor appetite and generalized weakness 2. ENT no runny nose or sore throat 3. Cardiovascular no angina, no claudication, no PND or orthopnea 4. Pulmonary no shortness of breath, cough or hemoptysis 5. Gastrointestinal no nausea, vomiting or diarrhea 6. Skin. Ecchymosis on her right shoulder 7. Musculoskeletal no joint pain 8. Hematology no frequent infections 9. Urology no dysuria or increased urinary frequency 10. Endocrine a tremors, heat or  cold intolerance  Past Medical History:  Diagnosis Date  . Alcohol abuse, in remission   . Anxiety   . Atrial fibrillation ([place])   . Depression   . Dyspnea    walking distances   . Dysrhythmia   . History of bronchitis   . HTN (hypertension)   . Mass of right lung 07/13/2016  . Osteopenia   . Squamous cell carcinoma of right lung ([place]) 07/13/2016    Past Surgical History:  Procedure Laterality Date  . IR FLUORO GUIDE PORT INSERTION RIGHT  09/17/2016  . IR US GUIDE VASC ACCESS RIGHT  09/17/2016  . NO PAST SURGERIES       reports that she quit smoking about 4 months ago. Her smoking use included Cigarettes. She started smoking about 60 years ago. She has a 120.00 pack-year smoking history. She has never used smokeless tobacco. She reports that she does not drink alcohol or use drugs.  No Known Allergies  Family History  Problem Relation Age of Onset  . Anxiety disorder Mother   . Dementia Mother   . Hypertension Mother   . Alcohol abuse Father   . Arthritis Father   . Alcohol abuse Brother   . ADD / ADHD Neg Hx   . Drug abuse Neg Hx   . Bipolar disorder Neg Hx   . Depression Neg Hx   . OCD Neg Hx   . Paranoid behavior Neg Hx   . Schizophrenia Neg Hx   . Seizures Neg Hx   . Sexual abuse Neg Hx   . Physical abuse Neg Hx    Unacceptable: Noncontributory, unremarkable, or negative. Acceptable: Family history reviewed and not pertinent (If you reviewed it)  Prior to Admission  medications   Medication Sig Start Date End Date Taking? Authorizing Provider  amiodarone (PACERONE) 200 MG tablet Take 200 mg by mouth daily.  08/04/16  Yes [provider]  aspirin EC 81 MG tablet Take 81 mg by mouth daily.   Yes [provider]  atorvastatin (LIPITOR) 40 MG tablet TAKE 1 TABLET (40 MG TOTAL) BY MOUTH DAILY. FOR HYPERLIPIDEMIA 05/29/16  Yes Hawks, Christy A, FNP  busPIRone (BUSPAR) 15 MG tablet Take 1 tablet (15 mg total) by mouth 2 (two) times daily. 08/02/16  Yes  Cloria Spring, MD  Calcium Carb-Cholecalciferol (CALCIUM 500+D3) 500-400 MG-UNIT TABS Take 1 tablet by mouth 2 (two) times daily.   Yes [provider]  CARBOPLATIN IV Inject into the vein. weekly   Yes [provider]  docusate calcium (SURFAK) 240 MG capsule Take 1 capsule (240 mg total) by mouth daily as needed for mild constipation. 11/01/16  Yes Twana First, MD  DULoxetine (CYMBALTA) 60 MG capsule Take 1 capsule (60 mg total) by mouth daily. 08/02/16 08/02/17 Yes Cloria Spring, MD  fish oil-omega-3 fatty acids 1000 MG capsule Take 3 capsules (3 g total) by mouth daily. For hyperlipidemia. Patient taking differently: Take 1 g by mouth 3 (three) times daily. For hyperlipidemia. 10/19/11  Yes Mashburn, Marlane Hatcher, PA-C  fluticasone (FLONASE) 50 MCG/ACT nasal spray Place 2 sprays into both nostrils 2 (two) times daily. Use as needed Patient taking differently: Place 2 sprays into both nostrils 2 (two) times daily as needed for allergies. Use as needed 11/05/15  Yes Hawks, Christy A, FNP  gabapentin (NEURONTIN) 100 MG capsule Take 1 capsule (100 mg total) by mouth at bedtime. 08/02/16  Yes Cloria Spring, MD  lidocaine-prilocaine (EMLA) cream Apply to affected area once 09/06/16  Yes Twana First, MD  lisinopril (PRINIVIL,ZESTRIL) 5 MG tablet Take 5 mg by mouth. 08/25/16  Yes [provider]  meclizine (ANTIVERT) 25 MG tablet Take 25 mg by mouth 3 (three) times daily as needed for dizziness.   Yes [provider]  nitroGLYCERIN (NITROSTAT) 0.4 MG SL tablet Place 0.4 mg under the tongue. 07/13/16  Yes [provider]  omeprazole (PRILOSEC) 40 MG capsule Take 1 capsule (40 mg total) by mouth daily. 11/01/16  Yes Twana First, MD  PACLitaxel (TAXOL IV) Inject into the vein. Weekly   Yes [provider]  warfarin (COUMADIN) 2.5 MG tablet Take 2.5 mg by mouth every evening.    Yes [provider]  dexamethasone (DECADRON) 4 MG tablet Take 2 tablets (8 mg  total) by mouth daily. Start the day after chemotherapy for 2 days. Patient not taking: Reported on 11/20/2016 09/06/16   Twana First, MD  lidocaine (XYLOCAINE) 2 % solution 10 mL by Mouth route every four (4) hours as needed. for up to 10 doses 11/05/16   [provider]  LORazepam (ATIVAN) 0.5 MG tablet Take 1 tablet (0.5 mg total) by mouth every 6 (six) hours as needed (Nausea or vomiting). Patient not taking: Reported on 11/20/2016 09/06/16   Twana First, MD  prochlorperazine (COMPAZINE) 10 MG tablet Take 1 tablet (10 mg total) by mouth every 6 (six) hours as needed (Nausea or vomiting). Patient not taking: Reported on 11/20/2016 09/06/16   Twana First, MD    Physical Exam: Vitals:   11/20/16 1400 11/20/16 1415 11/20/16 1430 11/20/16 1445  BP: (!) 155/90  (!) 153/88   Pulse: 85 84 84 87  Resp: 16 19 17 18   Temp:  TempSrc:      SpO2: 100% 99% 96% 100%  Weight:      Height:        Constitutional: deconditioning Vitals:   11/20/16 1400 11/20/16 1415 11/20/16 1430 11/20/16 1445  BP: (!) 155/90  (!) 153/88   Pulse: 85 84 84 87  Resp: 16 19 17 18   Temp:      TempSrc:      SpO2: 100% 99% 96% 100%  Weight:      Height:       Eyes: PERRL, lids and conjunctivae, pallor no icterus.  ENMT: Mucous membranes are dry. Posterior pharynx clear of any exudate or lesions.Normal dentition.  Neck: normal, supple, no masses, no thyromegaly Respiratory: clear to auscultation bilaterally, no wheezing, no crackles. Normal respiratory effort. No accessory muscle use.  Cardiovascular: Regular with extra beats, no murmurs / rubs / gallops. No extremity edema. 2+ pedal pulses. No carotid bruits.  Abdomen: no tenderness, no masses palpated. No hepatosplenomegaly. Bowel sounds positive.  Musculoskeletal: no clubbing / cyanosis. No joint deformity upper and lower extremities. Good ROM, no contractures. Normal muscle tone.  Skin: ecchymosis on her right shoulder.  Neurologic: CN 2-12 grossly  intact. Sensation intact, DTR normal. Strength 5/5 in all 4.   Labs on Admission: I have personally reviewed following labs and imaging studies  CBC:  Recent Labs Lab 11/15/16 0844 11/20/16 1209  WBC 3.9* 3.3*  NEUTROABS 3.0 2.9  HGB 10.4* 10.8*  HCT 30.4* 31.9*  MCV 88.4 89.1  PLT 225 053   Basic Metabolic Panel:  Recent Labs Lab 11/15/16 0844 11/20/16 1209  NA 145 137  K 3.9 3.8  CL 112* 103  CO2 25 26  GLUCOSE 101* 136*  BUN 16 19  CREATININE 0.81 1.06*  CALCIUM 8.8* 8.9   GFR: Estimated Creatinine Clearance: 35 mL/min (A) (by C-G formula based on SCr of 1.06 mg/dL (H)). Liver Function Tests:  Recent Labs Lab 11/15/16 0844  AST 13*  ALT 17  ALKPHOS 58  BILITOT 0.4  PROT 5.9*  ALBUMIN 2.8*   No results for input(s): LIPASE, AMYLASE in the last 168 hours. No results for input(s): AMMONIA in the last 168 hours. Coagulation Profile:  Recent Labs Lab 11/15/16 0844 11/20/16 1209  INR 2.39 2.12   Cardiac Enzymes: No results for input(s): CKTOTAL, CKMB, CKMBINDEX, TROPONINI in the last 168 hours. BNP (last 3 results) No results for input(s): PROBNP in the last 8760 hours. HbA1C: No results for input(s): HGBA1C in the last 72 hours. CBG:  Recent Labs Lab 11/20/16 1115  GLUCAP 108*   Lipid Profile: No results for input(s): CHOL, HDL, LDLCALC, TRIG, CHOLHDL, LDLDIRECT in the last 72 hours. Thyroid Function Tests: No results for input(s): TSH, T4TOTAL, FREET4, T3FREE, THYROIDAB in the last 72 hours. Anemia Panel: No results for input(s): VITAMINB12, FOLATE, FERRITIN, TIBC, IRON, RETICCTPCT in the last 72 hours. Urine analysis:    Component Value Date/Time   COLORURINE YELLOW 11/20/2016 1126   APPEARANCEUR HAZY (A) 11/20/2016 1126   APPEARANCEUR Clear 09/10/2016 1114   LABSPEC 1.019 11/20/2016 1126   PHURINE 5.0 11/20/2016 1126   GLUCOSEU NEGATIVE 11/20/2016 1126   HGBUR NEGATIVE 11/20/2016 1126   BILIRUBINUR NEGATIVE 11/20/2016 1126    BILIRUBINUR Negative 09/10/2016 1114   KETONESUR NEGATIVE 11/20/2016 1126   PROTEINUR 30 (A) 11/20/2016 1126   UROBILINOGEN 0.2 10/14/2011 1132   NITRITE NEGATIVE 11/20/2016 [place] 11/20/2016 1126   LEUKOCYTESUR Negative 09/10/2016 1114    Radiological  Exams on Admission: Ct Head Wo Contrast  Result Date: 11/20/2016 CLINICAL DATA:  Patient found passed out outside. Patient on Coumadin and history of lung cancer. EXAM: CT HEAD WITHOUT CONTRAST TECHNIQUE: Contiguous axial images were obtained from the base of the skull through the vertex without intravenous contrast. COMPARISON:  CT 07/06/2016 and MRI brain 09/22/2016 FINDINGS: Brain: Ventricles, cisterns and other CSF spaces are within normal. There is no mass, mass effect, shift of midline structures or acute hemorrhage. No evidence of acute infarction. There is moderate symmetric white matter low-attenuation unchanged and may be due to chronic ischemic microvascular disease versus post treatment changes this patient with known lung cancer. Small lacune infarct over the left internal capsule. Vascular: No hyperdense vessel or unexpected calcification. Skull: Normal. Negative for fracture or focal lesion. Sinuses/Orbits: No acute finding. Other: None. IMPRESSION: No acute intracranial findings. Stable white matter low-attenuation likely chronic ischemic microvascular disease, although may be related to patient's treatment for lung cancer. Small old left-sided lacunar infarct. Electronically Signed   By: Marin Olp M.D.   On: 11/20/2016 13:49    EKG: Independently reviewed. EKG sinus rhythm, positive premature atrial complexes. Normal axis, poor R-wave progression.  Assessment/Plan Active Problems:   Syncope   This is an 82 year old female who has history of atrial fibrillation, presently finished chemotherapy and radiation therapy for lung cancer who presents after sustaining a syncope episode. Apparently the event was  preceded by lightheadedness and dizziness. It lasted for about 10 minutes, mild post syncope confusion. No evidence of seizure activity. On initial physical examination blood pressure 105/91, heart rate 85, respiratory rate 19. Positive pallor, dry oral mucosa, lungs clear to auscultation, heart S1-S2 present and rhythmic, the abdomen soft nontender, no extremity edema. Neurology nonfocal. Sodium 137, potassium 3.8, chloride 103, bicarbonate 26, glucose 136, BUN 19, creatinine 1.06, white count 3.3, hemoglobin 10.8, hematocrit 31.9, platelets 201, INR 2.12. Urine analysis negative for infection, 6-30 white cells. Chest x-ray clear for new infiltrates, right upper lobe mass, present.   Working diagnosis syncope, likely vasovagal  1. Syncope. Patient completed chemotherapy and radiation therapy for lung cancer, developed generalized weakness and overall deconditioning. Positive lightheadedness and dizziness before the event. No clinical signs of seizure activity. Will admit patient under observation, will place on a remote telemetry monitoring. Continue amiodarone 200 mg daily, continue warfarin but, hold aspirin and antihypertensive agents for now.  2. Paroxysmal atrial fibrillation, continue warfarin for anticoagulation, patient not on any AV blocking agents besides amiodarone.   3. Lung cancer. Patient has completed chemotherapy and radiation therapy. Follow-up with physical therapy evaluation.  4. Depression/anxiety. Continue lorazepam every 6 hours as needed, duloxetine and buspirone.  DVT prophylaxis: on warfarin  Code Status full  Family Communication: I spoke with patient's daughter at the bedside and all questions were addressed.  Disposition Plan: Home  Consults called:  Admission status: observation    Shardae Kleinman Gerome Apley MD [facility] Pager [phone]  If 7PM-7AM, please contact night-coverage [email or web address] Password [email or web address]  11/20/2016, 3:11 PM

## 2016-11-20 NOTE — ED Provider Notes (Signed)
[facility] DEPT Provider Note   CSN: [id number] Arrival date & time: 11/20/16  1108     History   Chief Complaint Chief Complaint  Patient presents with  . Loss of Consciousness    HPI Alexandria Chen is a 82 y.o. female.  The history is provided by the patient.  Loss of Consciousness   This is a new problem. The current episode started less than 1 hour ago. The problem occurs rarely. The problem has been resolved. She lost consciousness for a period of greater than 5 minutes. The problem is associated with normal activity. Associated symptoms include congestion, light-headedness and weakness. Pertinent negatives include bladder incontinence, bowel incontinence, chest pain, diaphoresis, fever, focal weakness, nausea, palpitations, seizures, slurred speech and vomiting.   82 year old female who presents with syncope. She has a history of paroxysmal A. fib on Coumadin, right lung cancer status post chemoradiation therapy, and hypertension. History is provided by both the patient and her daughter who lives across the street from her. She patient states that she felt unwell all morning. She was waiting outside for her daughter to pick her up to go to the doctor's office due to recent fall. Her daughter saw that the patient had syncopal episode while standing on the front steps. She states she did feel dizzy this morning, but did not know she was going to pass out right before.  Episode lasted for about 10 minutes. Patient appeared unresponsive and could not wake to verbal or tactile stimuli. She did not have seizure activity. They did place her in the car, and drove to the PCPs office which was less than a mile from their home. CPR about to be started there, but patient started to respond to stimuli once she was brought in to the waiting area.  She was briefly confused, but quickly returned to normal mentation. She reports decreased appetite recently but no nausea, vomiting, diarrhea. No chest  pain or difficulty breathing, abdominal pain or back pain. Does complain of mild cough with congestion. No melena or hematochezia. Past Medical History:  Diagnosis Date  . Alcohol abuse, in remission   . Anxiety   . Atrial fibrillation ([place])   . Depression   . Dyspnea    walking distances   . Dysrhythmia   . History of bronchitis   . HTN (hypertension)   . Mass of right lung 07/13/2016  . Osteopenia   . Squamous cell carcinoma of right lung ([place]) 07/13/2016    Patient Active Problem List   Diagnosis Date Noted  . Syncope 11/20/2016  . Atrial fibrillation ([place]) [I48.91] 09/09/2016  . Squamous cell carcinoma of right lung ([place]) 07/13/2016  . Syncope and collapse 04/14/2015  . Otitis externa of right ear 12/18/2014  . Hearing loss due to cerumen impaction 12/18/2014  . Essential hypertension, benign 01/14/2014  . Hyperlipidemia 01/14/2014  . Vitamin D deficiency 01/14/2014  . Alcohol dependence in remission ([place]) 10/15/2011    Class: Acute  . Depression 08/31/2011  . Anxiety 08/31/2011    Past Surgical History:  Procedure Laterality Date  . IR FLUORO GUIDE PORT INSERTION RIGHT  09/17/2016  . IR US GUIDE VASC ACCESS RIGHT  09/17/2016  . NO PAST SURGERIES      OB History    No data available       Home Medications    Prior to Admission medications   Medication Sig Start Date End Date Taking? Authorizing Provider  amiodarone (PACERONE) 200 MG tablet Take 200 mg by  mouth daily.  08/04/16  Yes [provider]  aspirin EC 81 MG tablet Take 81 mg by mouth daily.   Yes [provider]  atorvastatin (LIPITOR) 40 MG tablet TAKE 1 TABLET (40 MG TOTAL) BY MOUTH DAILY. FOR HYPERLIPIDEMIA 05/29/16  Yes Hawks, Christy A, FNP  busPIRone (BUSPAR) 15 MG tablet Take 1 tablet (15 mg total) by mouth 2 (two) times daily. 08/02/16  Yes Cloria Spring, MD  Calcium Carb-Cholecalciferol (CALCIUM 500+D3) 500-400 MG-UNIT TABS Take 1 tablet by mouth 2 (two) times daily.   Yes  [provider]  CARBOPLATIN IV Inject into the vein. weekly   Yes [provider]  docusate calcium (SURFAK) 240 MG capsule Take 1 capsule (240 mg total) by mouth daily as needed for mild constipation. 11/01/16  Yes Twana First, MD  DULoxetine (CYMBALTA) 60 MG capsule Take 1 capsule (60 mg total) by mouth daily. 08/02/16 08/02/17 Yes Cloria Spring, MD  fish oil-omega-3 fatty acids 1000 MG capsule Take 3 capsules (3 g total) by mouth daily. For hyperlipidemia. Patient taking differently: Take 1 g by mouth 3 (three) times daily. For hyperlipidemia. 10/19/11  Yes Mashburn, Marlane Hatcher, PA-C  fluticasone (FLONASE) 50 MCG/ACT nasal spray Place 2 sprays into both nostrils 2 (two) times daily. Use as needed Patient taking differently: Place 2 sprays into both nostrils 2 (two) times daily as needed for allergies. Use as needed 11/05/15  Yes Hawks, Christy A, FNP  gabapentin (NEURONTIN) 100 MG capsule Take 1 capsule (100 mg total) by mouth at bedtime. 08/02/16  Yes Cloria Spring, MD  lidocaine-prilocaine (EMLA) cream Apply to affected area once 09/06/16  Yes Twana First, MD  lisinopril (PRINIVIL,ZESTRIL) 5 MG tablet Take 5 mg by mouth. 08/25/16  Yes [provider]  meclizine (ANTIVERT) 25 MG tablet Take 25 mg by mouth 3 (three) times daily as needed for dizziness.   Yes [provider]  nitroGLYCERIN (NITROSTAT) 0.4 MG SL tablet Place 0.4 mg under the tongue. 07/13/16  Yes [provider]  omeprazole (PRILOSEC) 40 MG capsule Take 1 capsule (40 mg total) by mouth daily. 11/01/16  Yes Twana First, MD  PACLitaxel (TAXOL IV) Inject into the vein. Weekly   Yes [provider]  warfarin (COUMADIN) 2.5 MG tablet Take 2.5 mg by mouth every evening.    Yes [provider]  dexamethasone (DECADRON) 4 MG tablet Take 2 tablets (8 mg total) by mouth daily. Start the day after chemotherapy for 2 days. Patient not taking: Reported on 11/20/2016 09/06/16   Twana First, MD    lidocaine (XYLOCAINE) 2 % solution 10 mL by Mouth route every four (4) hours as needed. for up to 10 doses 11/05/16   [provider]  LORazepam (ATIVAN) 0.5 MG tablet Take 1 tablet (0.5 mg total) by mouth every 6 (six) hours as needed (Nausea or vomiting). Patient not taking: Reported on 11/20/2016 09/06/16   Twana First, MD  prochlorperazine (COMPAZINE) 10 MG tablet Take 1 tablet (10 mg total) by mouth every 6 (six) hours as needed (Nausea or vomiting). Patient not taking: Reported on 11/20/2016 09/06/16   Twana First, MD    Family History Family History  Problem Relation Age of Onset  . Anxiety disorder Mother   . Dementia Mother   . Hypertension Mother   . Alcohol abuse Father   . Arthritis Father   . Alcohol abuse Brother   . ADD / ADHD Neg Hx   . Drug abuse Neg Hx   .  Bipolar disorder Neg Hx   . Depression Neg Hx   . OCD Neg Hx   . Paranoid behavior Neg Hx   . Schizophrenia Neg Hx   . Seizures Neg Hx   . Sexual abuse Neg Hx   . Physical abuse Neg Hx     Social History Social History  Substance Use Topics  . Smoking status: Former Smoker    Packs/day: 2.00    Years: 60.00    Types: Cigarettes    Start date: 05/03/1956    Quit date: 07/15/2016  . Smokeless tobacco: Never Used  . Alcohol use No     Comment: Patient reports no alcohol use since 05/24/2016.     Allergies   Patient has no known allergies.   Review of Systems Review of Systems  Constitutional: Negative for diaphoresis and fever.  HENT: Positive for congestion.   Cardiovascular: Positive for syncope. Negative for chest pain and palpitations.  Gastrointestinal: Negative for bowel incontinence, nausea and vomiting.  Genitourinary: Negative for bladder incontinence.  Neurological: Positive for weakness and light-headedness. Negative for focal weakness and seizures.  All other systems reviewed and are negative.    Physical Exam Updated Vital Signs BP (!) 153/88   Pulse 87   Temp 97.9 F (36.6  C) (Oral)   Resp 18   Ht 5\' 3"  (1.6 m)   Wt 61.2 kg (135 lb)   SpO2 100%   BMI 23.91 kg/m   Physical Exam Physical Exam  Nursing note and vitals reviewed. Constitutional: Well developed, well nourished, non-toxic, and in no acute distress Head: Normocephalic and atraumatic.  Mouth/Throat: Oropharynx is clear and moist.  Neck: Normal range of motion. Neck supple.  Cardiovascular: Normal rate and regular rhythm.   Pulmonary/Chest: Effort normal and breath sounds normal.  Abdominal: Soft. There is no tenderness. There is no rebound and no guarding.  Musculoskeletal: Normal range of motion.  Neurological: Alert, no facial droop, fluent speech, moves all extremities symmetrically, full strength bilateral hand grip and bilateral ankle dorsi/plantarflexion, sensation to light touch in tact throughout Skin: Skin is warm and dry.  Psychiatric: Cooperative   ED Treatments / Results  Labs (all labs ordered are listed, but only abnormal results are displayed) Labs Reviewed  BASIC METABOLIC PANEL - Abnormal; Notable for the following:       Result Value   Glucose, Bld 136 (*)    Creatinine, Ser 1.06 (*)    GFR calc non Af Amer 48 (*)    GFR calc Af Amer 56 (*)    All other components within normal limits  CBC - Abnormal; Notable for the following:    WBC 3.3 (*)    RBC 3.58 (*)    Hemoglobin 10.8 (*)    HCT 31.9 (*)    All other components within normal limits  URINALYSIS, ROUTINE W REFLEX MICROSCOPIC - Abnormal; Notable for the following:    APPearance HAZY (*)    Protein, ur 30 (*)    All other components within normal limits  PROTIME-INR - Abnormal; Notable for the following:    Prothrombin Time 24.1 (*)    All other components within normal limits  DIFFERENTIAL - Abnormal; Notable for the following:    Lymphs Abs 0.3 (*)    All other components within normal limits  CBG MONITORING, ED - Abnormal; Notable for the following:    Glucose-Capillary 108 (*)    All other  components within normal limits  CBG MONITORING, ED    EKG  EKG Interpretation  Date/Time:  Saturday November 20 2016 11:16:01 EDT Ventricular Rate:  86 PR Interval:    QRS Duration: 98 QT Interval:  373 QTC Calculation: 447 R Axis:   28 Text Interpretation:  Sinus rhythm Atrial premature complex Abnormal R-wave progression, early transition nonspecific twave changes Confirmed by Brantley Stage [phone]) on 11/20/2016 2:00:41 PM       Radiology Dg Chest 2 View  Result Date: 11/20/2016 CLINICAL DATA:  Coughing congestion. EXAM: CHEST  2 VIEW COMPARISON:  08/27/2016 FINDINGS: The cardiopericardial silhouette is within normal limits for size. Lungs are hyperexpanded. Interstitial markings are diffusely coarsened with chronic features. Left suprahilar mass appears smaller. Bones are diffusely demineralized. Telemetry leads overlie the chest. Right Port-A-Cath tip overlies the proximal SVC. IMPRESSION: 1. Hyperexpansion with chronic interstitial coarsening, compatible with emphysema. 2. Right suprahilar mass lesion appears smaller in the interval. Electronically Signed   By: Misty Stanley M.D.   On: 11/20/2016 15:15   Ct Head Wo Contrast  Result Date: 11/20/2016 CLINICAL DATA:  Patient found passed out outside. Patient on Coumadin and history of lung cancer. EXAM: CT HEAD WITHOUT CONTRAST TECHNIQUE: Contiguous axial images were obtained from the base of the skull through the vertex without intravenous contrast. COMPARISON:  CT 07/06/2016 and MRI brain 09/22/2016 FINDINGS: Brain: Ventricles, cisterns and other CSF spaces are within normal. There is no mass, mass effect, shift of midline structures or acute hemorrhage. No evidence of acute infarction. There is moderate symmetric white matter low-attenuation unchanged and may be due to chronic ischemic microvascular disease versus post treatment changes this patient with known lung cancer. Small lacune infarct over the left internal capsule. Vascular: No  hyperdense vessel or unexpected calcification. Skull: Normal. Negative for fracture or focal lesion. Sinuses/Orbits: No acute finding. Other: None. IMPRESSION: No acute intracranial findings. Stable white matter low-attenuation likely chronic ischemic microvascular disease, although may be related to patient's treatment for lung cancer. Small old left-sided lacunar infarct. Electronically Signed   By: Marin Olp M.D.   On: 11/20/2016 13:49    Procedures Procedures (including critical care time)  Medications Ordered in ED Medications  sodium chloride 0.9 % bolus 1,000 mL (0 mLs Intravenous Stopped 11/20/16 1351)     Initial Impression / Assessment and Plan / ED Course  I have reviewed the triage vital signs and the nursing notes.  Pertinent labs & imaging results that were available during my care of the patient were reviewed by me and considered in my medical decision making (see chart for details).     Presenting with LOC, prolonged in nature without seizure like activity or significant post-ictal period. She is baseline mentation in ED. No focal neuro deficits. Afebrile. Hemodynamically stable.   CT head shows no acute intracranial processes given head injury on coumadin. Blood work shows mild AKI, but no major electrolytes or metabolic derangements. UA without infection. EKG without ischemic changes or obvious stigmata of arrhythmia.   Patient discussed with Dr. Cathlean Sauer. She is admitted for observation for syncopal work-up.  Final Clinical Impressions(s) / ED Diagnoses   Final diagnoses:  Syncope and collapse    New Prescriptions New Prescriptions   No medications on file     Forde Dandy, MD 11/20/16 [phone]

## 2016-11-20 NOTE — Telephone Encounter (Signed)
Patient was brought in by her daughter and son. She was found to have been passed out on the front steps by her daughter and they rushed her here as fast as they could. Our front office staff and artery contacted EMS because the patient was still unconscious and brought into the front entry way by the patient's daughter and son. Patient came to very quickly after I arrived out there and EMS arrived a couple minutes later. Patient's heart rate was found to be in the 91 range. She was in A. fib but rate controlled. She still feels very lightheaded and dizzy and when EMS tried to stand her up she was still very wobbly headed per her. She denies any chest pain but does have some abdominal discomfort that she is complaining about for a few days. The initial reason that the family was going to bring her in today was because she had a large bruise on the back and they did not know how she received that bruise. EMS transported her to the emergency department. Caryl Pina, MD [facility] 11/20/2016, 11:03 AM

## 2016-11-20 NOTE — ED Triage Notes (Signed)
Per EMS daughter found pt passed out outside. No HX of seizures. CBG 108.

## 2016-11-21 ENCOUNTER — Other Ambulatory Visit (HOSPITAL_COMMUNITY): Payer: Self-pay | Admitting: Psychiatry

## 2016-11-21 DIAGNOSIS — R55 Syncope and collapse: Secondary | ICD-10-CM | POA: Diagnosis not present

## 2016-11-21 DIAGNOSIS — I4891 Unspecified atrial fibrillation: Secondary | ICD-10-CM | POA: Diagnosis not present

## 2016-11-21 DIAGNOSIS — C3490 Malignant neoplasm of unspecified part of unspecified bronchus or lung: Secondary | ICD-10-CM

## 2016-11-21 LAB — COMPREHENSIVE METABOLIC PANEL
ALK PHOS: 49 U/L (ref 38–126)
ALT: 14 U/L (ref 14–54)
AST: 14 U/L — AB (ref 15–41)
Albumin: 2.6 g/dL — ABNORMAL LOW (ref 3.5–5.0)
Anion gap: 5 (ref 5–15)
BUN: 12 mg/dL (ref 6–20)
CALCIUM: 8.5 mg/dL — AB (ref 8.9–10.3)
CHLORIDE: 104 mmol/L (ref 101–111)
CO2: 27 mmol/L (ref 22–32)
CREATININE: 0.9 mg/dL (ref 0.44–1.00)
GFR calc non Af Amer: 59 mL/min — ABNORMAL LOW (ref >60)
Glucose, Bld: 109 mg/dL — ABNORMAL HIGH (ref 65–99)
Potassium: 3.7 mmol/L (ref 3.5–5.1)
SODIUM: 136 mmol/L (ref 135–145)
Total Bilirubin: 0.7 mg/dL (ref 0.3–1.2)
Total Protein: 5.6 g/dL — ABNORMAL LOW (ref 6.5–8.1)

## 2016-11-21 LAB — PROTIME-INR
INR: 2.4
PROTHROMBIN TIME: 26.6 s — AB (ref 11.4–15.2)

## 2016-11-21 LAB — CBC
HCT: 27.3 % — ABNORMAL LOW (ref 36.0–46.0)
Hemoglobin: 9.1 g/dL — ABNORMAL LOW (ref 12.0–15.0)
MCH: 29.9 pg (ref 26.0–34.0)
MCHC: 33.3 g/dL (ref 30.0–36.0)
MCV: 89.8 fL (ref 78.0–100.0)
PLATELETS: 203 10*3/uL (ref 150–400)
RBC: 3.04 MIL/uL — AB (ref 3.87–5.11)
RDW: 15.4 % (ref 11.5–15.5)
WBC: 2.6 10*3/uL — ABNORMAL LOW (ref 4.0–10.5)

## 2016-11-21 MED ORDER — LISINOPRIL 5 MG PO TABS
5.0000 mg | ORAL_TABLET | Freq: Every day | ORAL | Status: DC
  Administered 2016-11-21: 5 mg via ORAL
  Filled 2016-11-21: qty 1

## 2016-11-21 NOTE — Progress Notes (Signed)
SATURATION QUALIFICATIONS: (This note is used to comply with regulatory documentation for home oxygen)  Patient Saturations on Room Air at Rest = 100%  Patient Saturations on Room Air while Ambulating = 100%

## 2016-11-21 NOTE — Progress Notes (Signed)
Pt's IV catheter removed and intact. Pt's IV site clean dry and intact. Discharge instructions including medications and follow up appointments were reviewed and discussed with patient's daughter. All questions were answered and no further questions at this time. Pt's daughter verbalized understanding of discharge instructions.  Pt in stable condition and in no acute distress at time of discharge. Pt escorted by RN.

## 2016-11-21 NOTE — Discharge Summary (Signed)
Physician Discharge Summary  Alexandria Chen OEU:[id number] DOB: January 18, 1935 DOA: 11/20/2016  PCP: [name], FNP  Admit date: 11/20/2016 Discharge date: 11/21/2016  Admitted From: Home  Disposition:  Home    Recommendations for Outpatient Follow-up:  1. Follow up with PCP in 1- weeks   Home Health: yes  Equipment/Devices: NA   Discharge Condition: Stable  CODE STATUS: Full  Diet recommendation: Heart Healthy    Brief/Interim Summary: This is an 82 year old female who has history of atrial fibrillation, recently finished chemotherapy and radiation therapy for lung cancer who presents after sustaining a syncope episode. Apparently the event was preceded by lightheadedness and dizziness. It lasted for about 10 minutes, mild post syncope confusion. No evidence of seizure activity. On initial physical examination blood pressure 105/91, heart rate 85, respiratory rate 19. Positive pallor, dry oral mucosa, lungs clear to auscultation, heart S1-S2 present and rhythmic, the abdomen soft nontender, no lower extremity edema. Neurology nonfocal. Sodium 137, potassium 3.8, chloride 103, bicarbonate 26, glucose 136, BUN 19, creatinine 1.06, white count 3.3, hemoglobin 10.8, hematocrit 31.9, platelets 201, INR 2.12. Urine analysis negative for infection, 6-30 white cells. Chest x-ray clear for new infiltrates, right upper lobe mass, present.   Working diagnosis syncope, likely vasovagal  1. Syncope. Patient was admitted to medical ward, she was placed on remote telemetry monitor, remained hemodynamically stable, she was continued on amiodarone with good rate control. Likely syncope vasovagal. Patient will be discharged home with home health services. Orthostatic vital signs were negative.  2. Paroxysmal atrial fibrillation. Patient was continued on amiodarone and warfarin. Telemetry remained rate controlled.  3. Lung cancer. Patient has recently completed chemotherapy and radiation therapy, likely  generalized weakness is a consequence of recent cancer therapy, will arrange for home health services. Close follow-up as an outpatient.  4. Depression and anxiety. Continue home regimen with the buspirone, duloxetine and lorazepam.   Discharge Diagnoses:  Active Problems:   Syncope    Discharge Instructions   Allergies as of 11/21/2016   No Known Allergies     Medication List    STOP taking these medications   CARBOPLATIN IV   dexamethasone 4 MG tablet Commonly known as:  DECADRON   lidocaine 2 % solution Commonly known as:  XYLOCAINE   lidocaine-prilocaine cream Commonly known as:  EMLA   LORazepam 0.5 MG tablet Commonly known as:  ATIVAN   prochlorperazine 10 MG tablet Commonly known as:  COMPAZINE   TAXOL IV     TAKE these medications   amiodarone 200 MG tablet Commonly known as:  PACERONE Take 200 mg by mouth daily.   aspirin EC 81 MG tablet Take 81 mg by mouth daily.   atorvastatin 40 MG tablet Commonly known as:  LIPITOR TAKE 1 TABLET (40 MG TOTAL) BY MOUTH DAILY. FOR HYPERLIPIDEMIA   busPIRone 15 MG tablet Commonly known as:  BUSPAR Take 1 tablet (15 mg total) by mouth 2 (two) times daily.   CALCIUM 500+D3 500-400 MG-UNIT Tabs Generic drug:  Calcium Carb-Cholecalciferol Take 1 tablet by mouth 2 (two) times daily.   docusate calcium 240 MG capsule Commonly known as:  SURFAK Take 1 capsule (240 mg total) by mouth daily as needed for mild constipation.   DULoxetine 60 MG capsule Commonly known as:  CYMBALTA Take 1 capsule (60 mg total) by mouth daily.   fish oil-omega-3 fatty acids 1000 MG capsule Take 3 capsules (3 g total) by mouth daily. For hyperlipidemia. What changed:  how much to take  when to take this  additional instructions   fluticasone 50 MCG/ACT nasal spray Commonly known as:  FLONASE Place 2 sprays into both nostrils 2 (two) times daily. Use as needed What changed:  when to take this  reasons to take  this  additional instructions   gabapentin 100 MG capsule Commonly known as:  NEURONTIN Take 1 capsule (100 mg total) by mouth at bedtime.   lisinopril 5 MG tablet Commonly known as:  PRINIVIL,ZESTRIL Take 5 mg by mouth.   meclizine 25 MG tablet Commonly known as:  ANTIVERT Take 25 mg by mouth 3 (three) times daily as needed for dizziness.   nitroGLYCERIN 0.4 MG SL tablet Commonly known as:  NITROSTAT Place 0.4 mg under the tongue.   omeprazole 40 MG capsule Commonly known as:  PRILOSEC Take 1 capsule (40 mg total) by mouth daily.   warfarin 2.5 MG tablet Commonly known as:  COUMADIN Take 2.5 mg by mouth every evening.       No Known Allergies  Consultations:     Procedures/Studies: Dg Chest 2 View  Result Date: 11/20/2016 CLINICAL DATA:  Coughing congestion. EXAM: CHEST  2 VIEW COMPARISON:  08/27/2016 FINDINGS: The cardiopericardial silhouette is within normal limits for size. Lungs are hyperexpanded. Interstitial markings are diffusely coarsened with chronic features. Left suprahilar mass appears smaller. Bones are diffusely demineralized. Telemetry leads overlie the chest. Right Port-A-Cath tip overlies the proximal SVC. IMPRESSION: 1. Hyperexpansion with chronic interstitial coarsening, compatible with emphysema. 2. Right suprahilar mass lesion appears smaller in the interval. Electronically Signed   By: Misty Stanley M.D.   On: 11/20/2016 15:15   Ct Head Wo Contrast  Result Date: 11/20/2016 CLINICAL DATA:  Patient found passed out outside. Patient on Coumadin and history of lung cancer. EXAM: CT HEAD WITHOUT CONTRAST TECHNIQUE: Contiguous axial images were obtained from the base of the skull through the vertex without intravenous contrast. COMPARISON:  CT 07/06/2016 and MRI brain 09/22/2016 FINDINGS: Brain: Ventricles, cisterns and other CSF spaces are within normal. There is no mass, mass effect, shift of midline structures or acute hemorrhage. No evidence of acute  infarction. There is moderate symmetric white matter low-attenuation unchanged and may be due to chronic ischemic microvascular disease versus post treatment changes this patient with known lung cancer. Small lacune infarct over the left internal capsule. Vascular: No hyperdense vessel or unexpected calcification. Skull: Normal. Negative for fracture or focal lesion. Sinuses/Orbits: No acute finding. Other: None. IMPRESSION: No acute intracranial findings. Stable white matter low-attenuation likely chronic ischemic microvascular disease, although may be related to patient's treatment for lung cancer. Small old left-sided lacunar infarct. Electronically Signed   By: Marin Olp M.D.   On: 11/20/2016 13:49       Subjective: Patient feeling better, no nausea or vomiting, no chest pain, palpitations or dyspnea. Tolerating po well.   Discharge Exam: Vitals:   11/20/16 1728 11/21/16 0541  BP: 126/63 (!) 159/79  Pulse: 89 93  Resp: 16 16  Temp:  98.4 F (36.9 C)   Vitals:   11/20/16 1445 11/20/16 1728 11/21/16 0541 11/21/16 0800  BP:  126/63 (!) 159/79   Pulse: 87 89 93   Resp: 18 16 16    Temp:   98.4 F (36.9 C)   TempSrc:   Oral   SpO2: 100%  100% 100%  Weight:  60.8 kg (134 lb)    Height:  5\' 2"  (1.575 m)      General: Pt is alert, awake, not in acute  distress E ENT. Mild pallor or icterus, oral mucosa moist.  Cardiovascular: RRR, S1/S2 +, no rubs, no gallops Respiratory: CTA bilaterally, no wheezing, no rhonchi Abdominal: Soft, NT, ND, bowel sounds + Extremities: no edema, no cyanosis    The results of significant diagnostics from this hospitalization (including imaging, microbiology, ancillary and laboratory) are listed below for reference.     Microbiology: No results found for this or any previous visit (from the past 240 hour(s)).   Labs: BNP (last 3 results) No results for input(s): BNP in the last 8760 hours. Basic Metabolic Panel:  Recent Labs Lab  11/15/16 0844 11/20/16 1209 11/21/16 0701  NA 145 137 136  K 3.9 3.8 3.7  CL 112* 103 104  CO2 25 26 27   GLUCOSE 101* 136* 109*  BUN 16 19 12   CREATININE 0.81 1.06* 0.90  CALCIUM 8.8* 8.9 8.5*   Liver Function Tests:  Recent Labs Lab 11/15/16 0844 11/21/16 0701  AST 13* 14*  ALT 17 14  ALKPHOS 58 49  BILITOT 0.4 0.7  PROT 5.9* 5.6*  ALBUMIN 2.8* 2.6*   No results for input(s): LIPASE, AMYLASE in the last 168 hours. No results for input(s): AMMONIA in the last 168 hours. CBC:  Recent Labs Lab 11/15/16 0844 11/20/16 1209 11/21/16 0701  WBC 3.9* 3.3* 2.6*  NEUTROABS 3.0 2.9  --   HGB 10.4* 10.8* 9.1*  HCT 30.4* 31.9* 27.3*  MCV 88.4 89.1 89.8  PLT 225 201 203   Cardiac Enzymes: No results for input(s): CKTOTAL, CKMB, CKMBINDEX, TROPONINI in the last 168 hours. BNP: Invalid input(s): POCBNP CBG:  Recent Labs Lab 11/20/16 1115  GLUCAP 108*   D-Dimer No results for input(s): DDIMER in the last 72 hours. Hgb A1c No results for input(s): HGBA1C in the last 72 hours. Lipid Profile No results for input(s): CHOL, HDL, LDLCALC, TRIG, CHOLHDL, LDLDIRECT in the last 72 hours. Thyroid function studies No results for input(s): TSH, T4TOTAL, T3FREE, THYROIDAB in the last 72 hours.  Invalid input(s): FREET3 Anemia work up No results for input(s): VITAMINB12, FOLATE, FERRITIN, TIBC, IRON, RETICCTPCT in the last 72 hours. Urinalysis    Component Value Date/Time   COLORURINE YELLOW 11/20/2016 1126   APPEARANCEUR HAZY (A) 11/20/2016 1126   APPEARANCEUR Clear 09/10/2016 1114   LABSPEC 1.019 11/20/2016 1126   PHURINE 5.0 11/20/2016 1126   GLUCOSEU NEGATIVE 11/20/2016 1126   HGBUR NEGATIVE 11/20/2016 1126   [place] 11/20/2016 1126   BILIRUBINUR Negative 09/10/2016 1114   KETONESUR NEGATIVE 11/20/2016 1126   PROTEINUR 30 (A) 11/20/2016 1126   UROBILINOGEN 0.2 10/14/2011 1132   NITRITE NEGATIVE 11/20/2016 1126   LEUKOCYTESUR NEGATIVE 11/20/2016 1126    LEUKOCYTESUR Negative 09/10/2016 1114   Sepsis Labs Invalid input(s): PROCALCITONIN,  WBC,  LACTICIDVEN Microbiology No results found for this or any previous visit (from the past 240 hour(s)).   Time coordinating discharge: 45 minutes  SIGNED:   Tawni Millers, MD  [facility] 11/21/2016, 12:00 PM Pager   If 7PM-7AM, please contact night-coverage [email or web address] Password [email or web address]

## 2016-11-21 NOTE — Progress Notes (Signed)
Pt ambulated 1500 ft. Pt tolerated ambulation well and denied any discomfort during ambulation. Pt maintained oxygen saturation at 100% throughout ambulation.

## 2016-11-26 ENCOUNTER — Ambulatory Visit (INDEPENDENT_AMBULATORY_CARE_PROVIDER_SITE_OTHER): Payer: Medicare Other | Admitting: Family

## 2016-11-26 ENCOUNTER — Encounter: Payer: Self-pay | Admitting: Family

## 2016-11-26 VITALS — BP 100/69 | HR 99 | Temp 97.3°F | Ht 62.0 in | Wt 129.8 lb

## 2016-11-26 DIAGNOSIS — R55 Syncope and collapse: Secondary | ICD-10-CM | POA: Diagnosis not present

## 2016-11-26 DIAGNOSIS — I4891 Unspecified atrial fibrillation: Secondary | ICD-10-CM | POA: Diagnosis not present

## 2016-11-26 DIAGNOSIS — R350 Frequency of micturition: Secondary | ICD-10-CM

## 2016-11-26 DIAGNOSIS — Z09 Encounter for follow-up examination after completed treatment for conditions other than malignant neoplasm: Secondary | ICD-10-CM | POA: Diagnosis not present

## 2016-11-26 DIAGNOSIS — F419 Anxiety disorder, unspecified: Secondary | ICD-10-CM | POA: Diagnosis not present

## 2016-11-26 DIAGNOSIS — Z9181 History of falling: Secondary | ICD-10-CM | POA: Diagnosis not present

## 2016-11-26 DIAGNOSIS — R5383 Other fatigue: Secondary | ICD-10-CM

## 2016-11-26 DIAGNOSIS — C3491 Malignant neoplasm of unspecified part of right bronchus or lung: Secondary | ICD-10-CM

## 2016-11-26 NOTE — Patient Instructions (Signed)
Managing Low Blood Counts During Cancer Treatment Cancer treatments such as chemotherapy and radiation can sometimes cause a drop in the supply of blood cells in the body, including red blood cells, white blood cells, and platelets. These blood cells are produced in the body and are released into the blood to perform specific functions:  Red blood cells carry gases such as oxygen and carbon dioxide to and from your lungs.  White blood cells help protect you from infection.  Platelets help your body to form blood clots to prevent and control bleeding.  When cancer treatments cause a drop in blood cell counts, your body may not have enough cells to keep up its normal functions. Symptoms or problems that may result will vary depending on which type of blood cells the treatment is affecting. If your blood counts are low, you can take steps to help manage any problems. How can low blood counts affect me? Low blood counts have various effects depending on the type of blood cells involved:  If you have a low number of red blood cells, you have a condition called anemia. This can cause symptoms such as: ? Feeling tired and weak. ? Feeling light-headed. ? Being short of breath.  If you have a low number of white blood cells, you may be at higher risk for infections.  If you have a low number of platelets, you may bleed more easily, or your body may have trouble stopping any bleeding. You may also have more bruising.  How to manage symptoms or prevent problems from a low blood count If you have a low blood count, you can take steps to manage symptoms or prevent problems that may develop. The steps to take will depend on which type of blood cell is low. Low red blood cells Take these steps to help manage the symptoms of anemia:  Go for a walk or do some light exercise each day.  Take short naps during the day.  Eat foods that contain a lot of iron and protein. These include leafy vegetables, meat  and fish, beans, sweet potatoes, and dried fruit such as prunes, raisins, and apricots.  Ask for help with errands and with work that needs to be done around the house. It is important to save your energy.  Take vitamins or supplements-such as iron, vitamin U72, or folic acid-as told by your health care provider.  Practice relaxation techniques, such as yoga or meditation.  Low white blood cells Take these steps to help prevent infections:  Wash your hands often with warm, soapy water.  Avoid crowds of people and any person who has the flu or a fever.  Take care when cleaning yourself after using the bathroom. Tell your health care provider if you have any rectal sores or bleeding.  Avoid dental work. Check your mouth each day for sores or signs of infection.  Do not share utensils.  Avoid contact with pet waste. Wash your hands after handling pets.  If you get a scrape or cut, clean it thoroughly right away.  Avoid fresh plants or dried flowers.  Do not swim or wade in lakes, ponds, rivers, water parks, or hot tubs.  Follow food safety guidelines. Cook meat thoroughly and wash all raw fruits and vegetables.  You may be instructed to wear a mask when around others to protect yourself.  Low platelets Take these steps to help prevent or control bleeding and bruising:  Use an electric razor for shaving instead of a  blade.  Use a soft toothbrush and be careful during oral care. Talk with your cancer care team about whether you should avoid flossing. If your mouth is bleeding, rinse it with ice water.  Avoid activities that could cause injury, such as contact sports.  Talk with your health care provider about using laxatives or stool softeners to avoid constipation.  Do not use medicines such as ibuprofen, aspirin, or naproxen unless your health care provider tells you to.  Limit alcohol use.  Monitor any bleeding closely. If you start bleeding, hold pressure on the area  for 5 minutes to stop the bleeding. Bleeding that does not stop is considered an emergency.  What treatments can help increase a low blood count? If needed, your health care provider may recommend treatment for a low blood count. Treatment will depend on the type of blood cell that is low and the severity of your condition. Treatment options may include:  Taking medicines to help stimulate the growth of blood cells. This is an option for treating a low red blood cell count. Your health care provider may also recommend that you take iron, folic acid, or vitamin B12 supplements.  Making dietary changes. Including more iron and protein in your diet can help stimulate the growth of red blood cells.  Adjusting your current medicines to help raise blood counts.  Making changes to your treatment plan.  Having a blood transfusion. This may be done if your blood count is very low.  Contact a health care provider if:  You feel extremely tired and weak.  You have more bruising or bleeding.  You feel ill or you develop a cough.  You have swelling or redness.  You have mouth sores or a sore throat.  You have painful urination or you have blood in your urine or stool.  You are thinking of taking any new supplements or vitamins or making dietary changes. Get help right away if:  You are short of breath, have chest pain, or feel dizzy.  You have a fever or chills.  You have abdominal pain or diarrhea.  You have bleeding that will not stop. Summary  Cancer treatments such as chemotherapy and radiation can sometimes cause a drop in the supply of blood cells in the body, including red blood cells, white blood cells, and platelets.  If you have a low blood count, you can take steps to manage symptoms or prevent problems that may develop.  Depending on which type of blood cell is low, you may need to take steps to prevent infection, prevent bleeding, or manage symptoms that may develop.  If  needed, your health care provider may recommend treatment for a low blood count. This information is not intended to replace advice given to you by your health care provider. Make sure you discuss any questions you have with your health care provider. Document Released: 12/13/2015 Document Revised: 12/13/2015 Document Reviewed: 12/13/2015 Elsevier Interactive Patient Education  [employer].

## 2016-11-26 NOTE — Addendum Note (Signed)
Addended by: Evelina Dun A on: 11/26/2016 12:24 PM   Modules accepted: Orders

## 2016-11-26 NOTE — Progress Notes (Signed)
   Subjective:    Patient ID: Alexandria Chen, female    DOB: 03/23/1935, 82 y.o.   MRN: [id number]  HPI Pt presents to the office today for hospital follow up after a syncope episode. Pt is currently being treated for  A Fib, finished Chemotherapy (11/15/16) & radiation(11/17/16) for lung cancer.   Daughter and pt state she is weak, light headed, and dizziness. Daughter states pt is extremely fatigued. Pt is A&O, but states "I'm just tired". Daughter is tearful and states "it is hard to see my mom like this".   [facility] notes were reviewed.   Review of Systems  Constitutional: Positive for fatigue and unexpected weight change.  Neurological: Positive for dizziness, weakness and light-headedness.  All other systems reviewed and are negative.      Objective:   Physical Exam  Constitutional: She is oriented to person, place, and time. She appears well-developed and well-nourished. No distress.  HENT:  Head: Normocephalic.  Eyes: Pupils are equal, round, and reactive to light.  Cardiovascular: Normal rate, regular rhythm, normal heart sounds and intact distal pulses.   No murmur heard. Pulmonary/Chest: Effort normal. No respiratory distress. She has decreased breath sounds. She has no wheezes.  Abdominal: Soft. Bowel sounds are normal. She exhibits no distension. There is no tenderness.  Musculoskeletal: Normal range of motion. She exhibits no edema or tenderness.  Generalized weakness  Neurological: She is alert and oriented to person, place, and time.  Skin: Skin is warm and dry.  ecchymosis present on right upper trunk  Psychiatric: She has a normal mood and affect. Her behavior is normal. Judgment and thought content normal.  Vitals reviewed.   BP 100/69   Pulse 99   Temp (!) 97.3 F (36.3 C) (Oral)   Ht 5\' 2"  (1.575 m)   Wt 129 lb 12.8 oz (58.9 kg)   BMI 23.74 kg/m      Assessment & Plan:  1. Hospital discharge follow-up - CBC with Differential/Platelet  2. Syncope  and collapse - CBC with Differential/Platelet  3. Atrial fibrillation, unspecified type ([place]) - CBC with Differential/Platelet  4. Squamous cell carcinoma of right lung (HCC) - CBC with Differential/Platelet  5. Anxiety - CBC with Differential/Platelet  6. Fatigue, unspecified type - CBC with Differential/Platelet  7. At high risk for falls   Falls precautions discussed Rest Encouraged when pt does "feel good" to do ROM and strength exercises Avoid anyone sick!! If she feels dizzy or lightheaded, sit down!! Do not try to walk or "make it" to the other room.  Force fluids and eat 6 small meals Keep all follow up with Oncologists   Continue all meds Labs pending RTO as needed   Evelina Dun, FNP

## 2016-11-27 ENCOUNTER — Inpatient Hospital Stay (HOSPITAL_COMMUNITY)
Admission: EM | Admit: 2016-11-27 | Discharge: 2016-12-06 | DRG: 871 | Disposition: A | Discharge status: Skilled Nursing Facility | Payer: Medicare Other | Attending: Internal Medicine | Admitting: Internal Medicine

## 2016-11-27 ENCOUNTER — Other Ambulatory Visit: Payer: Self-pay

## 2016-11-27 ENCOUNTER — Encounter (HOSPITAL_COMMUNITY): Payer: Self-pay | Admitting: Emergency Medicine

## 2016-11-27 ENCOUNTER — Emergency Department (HOSPITAL_COMMUNITY): Payer: Medicare Other

## 2016-11-27 DIAGNOSIS — I481 Persistent atrial fibrillation: Secondary | ICD-10-CM | POA: Diagnosis not present

## 2016-11-27 DIAGNOSIS — Z923 Personal history of irradiation: Secondary | ICD-10-CM | POA: Diagnosis not present

## 2016-11-27 DIAGNOSIS — J441 Chronic obstructive pulmonary disease with (acute) exacerbation: Secondary | ICD-10-CM | POA: Diagnosis not present

## 2016-11-27 DIAGNOSIS — D649 Anemia, unspecified: Secondary | ICD-10-CM | POA: Diagnosis present

## 2016-11-27 DIAGNOSIS — I480 Paroxysmal atrial fibrillation: Secondary | ICD-10-CM | POA: Diagnosis present

## 2016-11-27 DIAGNOSIS — F05 Delirium due to known physiological condition: Secondary | ICD-10-CM | POA: Diagnosis not present

## 2016-11-27 DIAGNOSIS — R55 Syncope and collapse: Secondary | ICD-10-CM | POA: Diagnosis not present

## 2016-11-27 DIAGNOSIS — R278 Other lack of coordination: Secondary | ICD-10-CM | POA: Diagnosis not present

## 2016-11-27 DIAGNOSIS — R296 Repeated falls: Secondary | ICD-10-CM | POA: Diagnosis present

## 2016-11-27 DIAGNOSIS — S299XXA Unspecified injury of thorax, initial encounter: Secondary | ICD-10-CM | POA: Diagnosis not present

## 2016-11-27 DIAGNOSIS — I951 Orthostatic hypotension: Secondary | ICD-10-CM | POA: Diagnosis present

## 2016-11-27 DIAGNOSIS — Z811 Family history of alcohol abuse and dependence: Secondary | ICD-10-CM | POA: Diagnosis not present

## 2016-11-27 DIAGNOSIS — I482 Chronic atrial fibrillation: Secondary | ICD-10-CM | POA: Diagnosis present

## 2016-11-27 DIAGNOSIS — F329 Major depressive disorder, single episode, unspecified: Secondary | ICD-10-CM | POA: Diagnosis present

## 2016-11-27 DIAGNOSIS — I10 Essential (primary) hypertension: Secondary | ICD-10-CM | POA: Diagnosis present

## 2016-11-27 DIAGNOSIS — K219 Gastro-esophageal reflux disease without esophagitis: Secondary | ICD-10-CM | POA: Diagnosis not present

## 2016-11-27 DIAGNOSIS — I6529 Occlusion and stenosis of unspecified carotid artery: Secondary | ICD-10-CM | POA: Diagnosis present

## 2016-11-27 DIAGNOSIS — I6523 Occlusion and stenosis of bilateral carotid arteries: Secondary | ICD-10-CM | POA: Diagnosis not present

## 2016-11-27 DIAGNOSIS — R509 Fever, unspecified: Secondary | ICD-10-CM

## 2016-11-27 DIAGNOSIS — C3431 Malignant neoplasm of lower lobe, right bronchus or lung: Secondary | ICD-10-CM | POA: Diagnosis present

## 2016-11-27 DIAGNOSIS — S0990XA Unspecified injury of head, initial encounter: Secondary | ICD-10-CM | POA: Diagnosis not present

## 2016-11-27 DIAGNOSIS — Z9221 Personal history of antineoplastic chemotherapy: Secondary | ICD-10-CM

## 2016-11-27 DIAGNOSIS — R05 Cough: Secondary | ICD-10-CM | POA: Diagnosis not present

## 2016-11-27 DIAGNOSIS — Z7901 Long term (current) use of anticoagulants: Secondary | ICD-10-CM | POA: Diagnosis not present

## 2016-11-27 DIAGNOSIS — C3491 Malignant neoplasm of unspecified part of right bronchus or lung: Secondary | ICD-10-CM | POA: Diagnosis present

## 2016-11-27 DIAGNOSIS — G629 Polyneuropathy, unspecified: Secondary | ICD-10-CM | POA: Diagnosis not present

## 2016-11-27 DIAGNOSIS — M6281 Muscle weakness (generalized): Secondary | ICD-10-CM | POA: Diagnosis not present

## 2016-11-27 DIAGNOSIS — A419 Sepsis, unspecified organism: Secondary | ICD-10-CM | POA: Diagnosis not present

## 2016-11-27 DIAGNOSIS — Z7982 Long term (current) use of aspirin: Secondary | ICD-10-CM

## 2016-11-27 DIAGNOSIS — R42 Dizziness and giddiness: Secondary | ICD-10-CM | POA: Diagnosis not present

## 2016-11-27 DIAGNOSIS — R410 Disorientation, unspecified: Secondary | ICD-10-CM

## 2016-11-27 DIAGNOSIS — R4182 Altered mental status, unspecified: Secondary | ICD-10-CM

## 2016-11-27 DIAGNOSIS — R059 Cough, unspecified: Secondary | ICD-10-CM

## 2016-11-27 DIAGNOSIS — J189 Pneumonia, unspecified organism: Secondary | ICD-10-CM | POA: Diagnosis not present

## 2016-11-27 DIAGNOSIS — F1011 Alcohol abuse, in remission: Secondary | ICD-10-CM | POA: Diagnosis present

## 2016-11-27 DIAGNOSIS — D638 Anemia in other chronic diseases classified elsewhere: Secondary | ICD-10-CM | POA: Diagnosis present

## 2016-11-27 DIAGNOSIS — E785 Hyperlipidemia, unspecified: Secondary | ICD-10-CM | POA: Diagnosis present

## 2016-11-27 DIAGNOSIS — E538 Deficiency of other specified B group vitamins: Secondary | ICD-10-CM | POA: Diagnosis present

## 2016-11-27 DIAGNOSIS — I4891 Unspecified atrial fibrillation: Secondary | ICD-10-CM | POA: Diagnosis not present

## 2016-11-27 DIAGNOSIS — R404 Transient alteration of awareness: Secondary | ICD-10-CM | POA: Diagnosis not present

## 2016-11-27 DIAGNOSIS — C3490 Malignant neoplasm of unspecified part of unspecified bronchus or lung: Secondary | ICD-10-CM | POA: Diagnosis not present

## 2016-11-27 DIAGNOSIS — F419 Anxiety disorder, unspecified: Secondary | ICD-10-CM | POA: Diagnosis not present

## 2016-11-27 DIAGNOSIS — Z87891 Personal history of nicotine dependence: Secondary | ICD-10-CM

## 2016-11-27 DIAGNOSIS — R2689 Other abnormalities of gait and mobility: Secondary | ICD-10-CM | POA: Diagnosis not present

## 2016-11-27 DIAGNOSIS — R079 Chest pain, unspecified: Secondary | ICD-10-CM | POA: Diagnosis not present

## 2016-11-27 DIAGNOSIS — R0781 Pleurodynia: Secondary | ICD-10-CM | POA: Diagnosis not present

## 2016-11-27 DIAGNOSIS — R531 Weakness: Secondary | ICD-10-CM | POA: Diagnosis not present

## 2016-11-27 LAB — CBG MONITORING, ED
Glucose-Capillary: 77 mg/dL (ref 65–99)
Glucose-Capillary: 78 mg/dL (ref 65–99)

## 2016-11-27 LAB — CBC WITH DIFFERENTIAL/PLATELET
BASOS PCT: 0 %
Basophils Absolute: 0 10*3/uL (ref 0.0–0.2)
Basophils Absolute: 0 10*3/uL (ref 0.0–0.1)
Basos: 0 %
EOS (ABSOLUTE): 0 10*3/uL (ref 0.0–0.4)
EOS ABS: 0 10*3/uL (ref 0.0–0.7)
EOS PCT: 0 %
EOS: 0 %
HCT: 27.9 % — ABNORMAL LOW (ref 36.0–46.0)
HEMATOCRIT: 28.1 % — AB (ref 34.0–46.6)
HEMOGLOBIN: 9.7 g/dL — AB (ref 11.1–15.9)
Hemoglobin: 9.5 g/dL — ABNORMAL LOW (ref 12.0–15.0)
Immature Grans (Abs): 0.1 10*3/uL (ref 0.0–0.1)
Immature Granulocytes: 2 %
LYMPHS ABS: 1.1 10*3/uL (ref 0.7–3.1)
LYMPHS ABS: 1.1 10*3/uL (ref 0.7–4.0)
Lymphocytes Relative: 14 %
Lymphs: 22 %
MCH: 29.1 pg (ref 26.6–33.0)
MCH: 29.8 pg (ref 26.0–34.0)
MCHC: 34.5 g/dL (ref 31.5–35.7)
MCHC: 34.1 g/dL (ref 30.0–36.0)
MCV: 84 fL (ref 79–97)
MCV: 87.5 fL (ref 78.0–100.0)
MONOCYTES: 24 %
MONOS PCT: 15 %
Monocytes Absolute: 1.2 10*3/uL — ABNORMAL HIGH (ref 0.1–0.9)
Monocytes Absolute: 1.2 10*3/uL — ABNORMAL HIGH (ref 0.1–1.0)
NEUTROS ABS: 2.6 10*3/uL (ref 1.4–7.0)
NEUTROS PCT: 71 %
Neutro Abs: 5.6 10*3/uL (ref 1.7–7.7)
Neutrophils: 52 %
PLATELETS: 272 10*3/uL (ref 150–400)
Platelets: 293 10*3/uL (ref 150–379)
RBC: 3.33 x10E6/uL — AB (ref 3.77–5.28)
RBC: 3.19 MIL/uL — AB (ref 3.87–5.11)
RDW: 16.3 % — ABNORMAL HIGH (ref 12.3–15.4)
RDW: 15.5 % (ref 11.5–15.5)
WBC: 4.8 10*3/uL (ref 3.4–10.8)
WBC: 8 10*3/uL (ref 4.0–10.5)

## 2016-11-27 LAB — CBC
HEMATOCRIT: 28.8 % — AB (ref 36.0–46.0)
HEMOGLOBIN: 9.8 g/dL — AB (ref 12.0–15.0)
MCH: 29.6 pg (ref 26.0–34.0)
MCHC: 34 g/dL (ref 30.0–36.0)
MCV: 87 fL (ref 78.0–100.0)
Platelets: 266 10*3/uL (ref 150–400)
RBC: 3.31 MIL/uL — AB (ref 3.87–5.11)
RDW: 15.5 % (ref 11.5–15.5)
WBC: 7 10*3/uL (ref 4.0–10.5)

## 2016-11-27 LAB — BASIC METABOLIC PANEL
ANION GAP: 12 (ref 5–15)
BUN: 13 mg/dL (ref 6–20)
CHLORIDE: 95 mmol/L — AB (ref 101–111)
CO2: 26 mmol/L (ref 22–32)
Calcium: 9 mg/dL (ref 8.9–10.3)
Creatinine, Ser: 0.91 mg/dL (ref 0.44–1.00)
GFR, EST NON AFRICAN AMERICAN: 58 mL/min — AB (ref >60)
Glucose, Bld: 109 mg/dL — ABNORMAL HIGH (ref 65–99)
POTASSIUM: 4 mmol/L (ref 3.5–5.1)
SODIUM: 133 mmol/L — AB (ref 135–145)

## 2016-11-27 LAB — PROCALCITONIN: PROCALCITONIN: 0.36 ng/mL

## 2016-11-27 LAB — URINALYSIS, ROUTINE W REFLEX MICROSCOPIC
Bilirubin Urine: NEGATIVE
GLUCOSE, UA: NEGATIVE mg/dL
Hgb urine dipstick: NEGATIVE
KETONES UR: NEGATIVE mg/dL
LEUKOCYTES UA: NEGATIVE
NITRITE: NEGATIVE
PH: 6 (ref 5.0–8.0)
Protein, ur: NEGATIVE mg/dL
Specific Gravity, Urine: 1.01 (ref 1.005–1.030)

## 2016-11-27 LAB — LACTIC ACID, PLASMA
LACTIC ACID, VENOUS: 1.8 mmol/L (ref 0.5–1.9)
LACTIC ACID, VENOUS: 1.5 mmol/L (ref 0.5–1.9)

## 2016-11-27 LAB — TROPONIN I: Troponin I: <0.03 ng/mL (ref <0.03)

## 2016-11-27 LAB — APTT: aPTT: 89 seconds — ABNORMAL HIGH (ref 24–36)

## 2016-11-27 LAB — PROTIME-INR
INR: 3.03
Prothrombin Time: 32 seconds — ABNORMAL HIGH (ref 11.4–15.2)

## 2016-11-27 MED ORDER — ACETAMINOPHEN 500 MG PO TABS
1000.0000 mg | ORAL_TABLET | Freq: Once | ORAL | Status: AC
  Administered 2016-11-27: 1000 mg via ORAL
  Filled 2016-11-27: qty 2

## 2016-11-27 MED ORDER — LISINOPRIL 5 MG PO TABS: 5.0000 mg | ORAL_TABLET | Freq: Every day | ORAL | Status: DC

## 2016-11-27 MED ORDER — DOCUSATE SODIUM 100 MG PO CAPS
100.0000 mg | ORAL_CAPSULE | Freq: Two times a day (BID) | ORAL | Status: DC
  Administered 2016-11-27 – 2016-12-05 (×16): 100 mg via ORAL
  Filled 2016-11-27 (×18): qty 1

## 2016-11-27 MED ORDER — ACETAMINOPHEN 325 MG PO TABS
650.0000 mg | ORAL_TABLET | Freq: Four times a day (QID) | ORAL | Status: DC | PRN
  Administered 2016-11-27 – 2016-12-01 (×8): 650 mg via ORAL
  Filled 2016-11-27 (×8): qty 2

## 2016-11-27 MED ORDER — BUSPIRONE HCL 15 MG PO TABS
15.0000 mg | ORAL_TABLET | Freq: Two times a day (BID) | ORAL | Status: DC
  Administered 2016-11-27 – 2016-12-06 (×18): 15 mg via ORAL
  Filled 2016-11-27 (×5): qty 1
  Filled 2016-11-27: qty 3
  Filled 2016-11-27 (×10): qty 1
  Filled 2016-11-27: qty 3
  Filled 2016-11-27: qty 1

## 2016-11-27 MED ORDER — SODIUM CHLORIDE 0.9 % IV BOLUS (SEPSIS)
1000.0000 mL | Freq: Once | INTRAVENOUS | Status: AC
  Administered 2016-11-27: 1000 mL via INTRAVENOUS

## 2016-11-27 MED ORDER — WARFARIN SODIUM 5 MG PO TABS: 2.5000 mg | ORAL_TABLET | Freq: Every day | ORAL | Status: DC

## 2016-11-27 MED ORDER — PIPERACILLIN-TAZOBACTAM 3.375 G IVPB 30 MIN
3.3750 g | Freq: Once | INTRAVENOUS | Status: AC
  Administered 2016-11-27: 3.375 g via INTRAVENOUS
  Filled 2016-11-27: qty 50

## 2016-11-27 MED ORDER — WARFARIN - PHYSICIAN DOSING INPATIENT: Freq: Every day | Status: DC

## 2016-11-27 MED ORDER — PIPERACILLIN-TAZOBACTAM 3.375 G IVPB
3.3750 g | Freq: Three times a day (TID) | INTRAVENOUS | Status: DC
  Administered 2016-11-27 – 2016-12-02 (×14): 3.375 g via INTRAVENOUS
  Filled 2016-11-27 (×14): qty 50

## 2016-11-27 MED ORDER — WARFARIN SODIUM 2.5 MG PO TABS: 2.5000 mg | ORAL_TABLET | Freq: Every day | ORAL | Status: DC

## 2016-11-27 MED ORDER — PANTOPRAZOLE SODIUM 40 MG PO TBEC
40.0000 mg | DELAYED_RELEASE_TABLET | Freq: Every day | ORAL | Status: DC
  Administered 2016-11-28 – 2016-12-06 (×9): 40 mg via ORAL
  Filled 2016-11-27 (×9): qty 1

## 2016-11-27 MED ORDER — AMIODARONE HCL 200 MG PO TABS: 200.0000 mg | ORAL_TABLET | Freq: Every day | ORAL | Status: DC

## 2016-11-27 MED ORDER — VANCOMYCIN HCL IN DEXTROSE 1-5 GM/200ML-% IV SOLN
1000.0000 mg | Freq: Once | INTRAVENOUS | Status: AC
  Administered 2016-11-27: 1000 mg via INTRAVENOUS
  Filled 2016-11-27: qty 200

## 2016-11-27 MED ORDER — ACETAMINOPHEN 650 MG RE SUPP: 650.0000 mg | Freq: Four times a day (QID) | RECTAL | Status: DC | PRN

## 2016-11-27 MED ORDER — ONDANSETRON HCL 4 MG/2ML IJ SOLN
4.0000 mg | Freq: Four times a day (QID) | INTRAMUSCULAR | Status: DC | PRN
  Administered 2016-11-29 – 2016-12-05 (×2): 4 mg via INTRAVENOUS
  Filled 2016-11-27 (×2): qty 2

## 2016-11-27 MED ORDER — AMIODARONE HCL 200 MG PO TABS
200.0000 mg | ORAL_TABLET | Freq: Every day | ORAL | Status: DC
  Administered 2016-11-27 – 2016-12-06 (×10): 200 mg via ORAL
  Filled 2016-11-27 (×10): qty 1

## 2016-11-27 MED ORDER — MECLIZINE HCL 12.5 MG PO TABS: 25.0000 mg | ORAL_TABLET | Freq: Three times a day (TID) | ORAL | Status: DC | PRN

## 2016-11-27 MED ORDER — VANCOMYCIN HCL IN DEXTROSE 1-5 GM/200ML-% IV SOLN
1000.0000 mg | INTRAVENOUS | Status: DC
  Administered 2016-11-28 – 2016-11-30 (×3): 1000 mg via INTRAVENOUS
  Filled 2016-11-27 (×3): qty 200

## 2016-11-27 MED ORDER — IOPAMIDOL (ISOVUE-370) INJECTION 76%
100.0000 mL | Freq: Once | INTRAVENOUS | Status: AC | PRN
  Administered 2016-11-27: 100 mL via INTRAVENOUS

## 2016-11-27 MED ORDER — ATORVASTATIN CALCIUM 40 MG PO TABS
40.0000 mg | ORAL_TABLET | Freq: Every day | ORAL | Status: DC
  Administered 2016-11-28 – 2016-12-05 (×8): 40 mg via ORAL
  Filled 2016-11-27 (×8): qty 1

## 2016-11-27 MED ORDER — SODIUM CHLORIDE 0.9 % IV SOLN
INTRAVENOUS | Status: DC
  Administered 2016-11-27 – 2016-11-28 (×3): via INTRAVENOUS

## 2016-11-27 MED ORDER — DULOXETINE HCL 30 MG PO CPEP
60.0000 mg | ORAL_CAPSULE | Freq: Every day | ORAL | Status: DC
  Administered 2016-11-28 – 2016-12-06 (×9): 60 mg via ORAL
  Filled 2016-11-27 (×9): qty 2

## 2016-11-27 MED ORDER — ASPIRIN EC 81 MG PO TBEC
81.0000 mg | DELAYED_RELEASE_TABLET | Freq: Every day | ORAL | Status: DC
  Administered 2016-11-28 – 2016-12-06 (×9): 81 mg via ORAL
  Filled 2016-11-27 (×9): qty 1

## 2016-11-27 MED ORDER — ONDANSETRON HCL 4 MG PO TABS
4.0000 mg | ORAL_TABLET | Freq: Four times a day (QID) | ORAL | Status: DC | PRN
  Administered 2016-11-30: 4 mg via ORAL
  Filled 2016-11-27: qty 1

## 2016-11-27 MED ORDER — SODIUM CHLORIDE 0.9% FLUSH
3.0000 mL | Freq: Two times a day (BID) | INTRAVENOUS | Status: DC
  Administered 2016-11-29 – 2016-12-06 (×8): 3 mL via INTRAVENOUS

## 2016-11-27 MED ORDER — LISINOPRIL 5 MG PO TABS
5.0000 mg | ORAL_TABLET | Freq: Every day | ORAL | Status: DC
  Administered 2016-11-27 – 2016-11-28 (×2): 5 mg via ORAL
  Filled 2016-11-27 (×2): qty 1

## 2016-11-27 MED ORDER — GABAPENTIN 100 MG PO CAPS
100.0000 mg | ORAL_CAPSULE | Freq: Every day | ORAL | Status: DC
  Administered 2016-11-27 – 2016-12-05 (×9): 100 mg via ORAL
  Filled 2016-11-27 (×9): qty 1

## 2016-11-27 NOTE — Progress Notes (Signed)
Pharmacy Antibiotic Note  Alexandria Chen is a 82 y.o. female admitted on 11/27/2016 with sepsis.  Pharmacy has been consulted for Vancomycin and Zosyn dosing.  Plan: Vancomycin 1000mg  IV every 24 hours.  Goal trough 15-20 mcg/mL. Zosyn 3.375g IV q8h (4 hour infusion).  F/U cxs and clinical progress Monitor V/S, labs, and levels as indicated  Height: 5\' 2"  (157.5 cm) Weight: 131 lb 13.4 oz (59.8 kg) IBW/kg (Calculated) : 50.1  Temp (24hrs), Avg:99.9 F (37.7 C), Min:98.6 F (37 C), Max:101.2 F (38.4 C)   Recent Labs Lab 11/21/16 0701 11/26/16 1231 11/27/16 1215 11/27/16 1332 11/27/16 1545  WBC 2.6* 4.8 7.0  --  8.0  CREATININE 0.90  --  0.91  --   --   LATICACIDVEN  --   --   --  1.8 1.5    Estimated Creatinine Clearance: 39 mL/min (by C-G formula based on SCr of 0.91 mg/dL).    No Known Allergies  Antimicrobials this admission: Vancomycin 7/28 >>  Zosyn 7/28 >>  Dose adjustments this admission: N/A  Microbiology results: 7/28 BCx: pending 7/28 UCx: pending  Thank you for allowing pharmacy to be a part of this patient's care.  Isac Sarna, BS Pharm D, [place] Clinical Pharmacist Pager [phone] 11/27/2016 6:59 PM

## 2016-11-27 NOTE — ED Triage Notes (Addendum)
Per EMS: Pt fell last night after getting dizzy, laid in floor for a few hours and managed to get self back into bed.  PT got up to use bathroom this morning and fell again and laid in floor for a few hours. Pt is on coumadin, hit back of head, having left rib pain. Denies neck or new back pain. Pt alert and oriented. cbg 112. 102/71, 94% ra.  EMS mentions that pt had h/a prior to any falling yesterday.

## 2016-11-27 NOTE — ED Notes (Signed)
Attempted to call report

## 2016-11-27 NOTE — H&P (Signed)
History and Physical    Alexandria Chen EHU:[id number] DOB: [birth year] DOA: 11/27/2016  PCP: [name], FNP Consultants:  Talbert Cage - oncology; Rische - cardiology at [facility] Patient coming from: Home - lives with grandson; [place]: daughter, [name] [phone]  Chief Complaint: Near syncope  HPI: Alexandria Chen is a 82 y.o. female with medical history significant of recent diagnosis of atrial fibrillation, on anticoagulation, lung cancer status post radiation and chemotherapy with admission 7/21-22 for probably vasovagal syncope.  Since discharge, she has been falling lately at home.  She was going to the bathroom overnight and she landed on the floor.  She has complained about back, head, and buttocks were hurting.  Today, they were helping her to bathe and she passed out. She was sitting in the bathtub when she became unresponsive and fell backwards.  It was supervised since family was helping her bathe.  Family now says she was never unconscious but would not respond.  She recently completed chemo/rads, did well during the treatment but she hd a similar episode to this last week.  No fevers.  +cough - worsening, given Mucinex yesterday.  Intermittent n/v but better since last week.  She was previously constipated and this was treated and so stools are intermittently watery over the last 3 weeks.  No urinary symptoms.  She was seen by her PCP yesterday and reported feeling weak, light-headed, and dizzy with extreme fatigue.   ED Course:  Given 1L fluid bolus, concern for sepsis so given Vanc/Zosyn  Review of Systems: As per HPI; otherwise review of systems reviewed and negative.   Ambulatory Status:  Ambulates at times with a cane  Past Medical History:  Diagnosis Date  . Alcohol abuse, in remission   . Anxiety   . Atrial fibrillation ([place])   . Depression   . Dyspnea    walking distances   . Dysrhythmia   . History of bronchitis   . HTN (hypertension)   . Mass of right lung 07/13/2016  .  Osteopenia   . Squamous cell carcinoma of right lung ([place]) 07/13/2016    Past Surgical History:  Procedure Laterality Date  . IR FLUORO GUIDE PORT INSERTION RIGHT  09/17/2016  . IR US GUIDE VASC ACCESS RIGHT  09/17/2016    Social History   Social History  . Marital status: Widowed    Spouse name: N/A  . Number of children: N/A  . Years of education: N/A   Occupational History  . Retired    Social History Main Topics  . Smoking status: Former Smoker    Packs/day: 2.00    Years: 60.00    Types: Cigarettes    Start date: 05/03/1956    Quit date: 07/15/2016  . Smokeless tobacco: Never Used  . Alcohol use No     Comment: Patient reports no alcohol use since 05/24/2016.  . Drug use: No  . Sexual activity: No   Other Topics Concern  . Not on file   Social History Narrative  . No narrative on file    No Known Allergies  Family History  Problem Relation Age of Onset  . Anxiety disorder Mother   . Dementia Mother   . Hypertension Mother   . Alcohol abuse Father   . Arthritis Father   . Alcohol abuse Brother   . ADD / ADHD Neg Hx   . Drug abuse Neg Hx   . Bipolar disorder Neg Hx   . Depression Neg Hx   .  OCD Neg Hx   . Paranoid behavior Neg Hx   . Schizophrenia Neg Hx   . Seizures Neg Hx   . Sexual abuse Neg Hx   . Physical abuse Neg Hx     Prior to Admission medications   Medication Sig Start Date End Date Taking? Authorizing Provider  amiodarone (PACERONE) 200 MG tablet Take 200 mg by mouth daily.  08/04/16  Yes [provider]  aspirin EC 81 MG tablet Take 81 mg by mouth daily.   Yes [provider]  atorvastatin (LIPITOR) 40 MG tablet TAKE 1 TABLET (40 MG TOTAL) BY MOUTH DAILY. FOR HYPERLIPIDEMIA 05/29/16  Yes Hawks, Christy A, FNP  busPIRone (BUSPAR) 15 MG tablet Take 1 tablet (15 mg total) by mouth 2 (two) times daily. 08/02/16  Yes Cloria Spring, MD  Calcium Carb-Cholecalciferol (CALCIUM 500+D3) 500-400 MG-UNIT TABS Take 1 tablet by mouth 2  (two) times daily.   Yes [provider]  docusate calcium (SURFAK) 240 MG capsule Take 1 capsule (240 mg total) by mouth daily as needed for mild constipation. 11/01/16  Yes Twana First, MD  DULoxetine (CYMBALTA) 60 MG capsule Take 1 capsule (60 mg total) by mouth daily. 08/02/16 08/02/17 Yes Cloria Spring, MD  fish oil-omega-3 fatty acids 1000 MG capsule Take 3 capsules (3 g total) by mouth daily. For hyperlipidemia. Patient taking differently: Take 1 g by mouth 3 (three) times daily. For hyperlipidemia. 10/19/11  Yes Mashburn, Marlane Hatcher, PA-C  fluticasone (FLONASE) 50 MCG/ACT nasal spray Place 2 sprays into both nostrils 2 (two) times daily. Use as needed Patient taking differently: Place 2 sprays into both nostrils 2 (two) times daily as needed for allergies. Use as needed 11/05/15  Yes Hawks, Christy A, FNP  gabapentin (NEURONTIN) 100 MG capsule Take 1 capsule (100 mg total) by mouth at bedtime. 08/02/16  Yes Cloria Spring, MD  lisinopril (PRINIVIL,ZESTRIL) 5 MG tablet Take 5 mg by mouth. 08/25/16  Yes [provider]  meclizine (ANTIVERT) 25 MG tablet Take 25 mg by mouth 3 (three) times daily as needed for dizziness.   Yes [provider]  nitroGLYCERIN (NITROSTAT) 0.4 MG SL tablet Place 0.4 mg under the tongue. 07/13/16  Yes [provider]  omeprazole (PRILOSEC) 40 MG capsule Take 1 capsule (40 mg total) by mouth daily. 11/01/16  Yes Twana First, MD  warfarin (COUMADIN) 2.5 MG tablet Take 2.5 mg by mouth daily.    Yes [provider]    Physical Exam: Vitals:   11/27/16 1405 11/27/16 1700 11/27/16 1730 11/27/16 1800  BP:  131/87 (!) 145/68 (!) 142/69  Pulse:  98 95   Resp:      Temp: (!) 101.2 F (38.4 C)     TempSrc: Rectal     SpO2:  97% 95%   Weight:      Height:         General:  Appears calm and comfortable and is NAD Eyes:  PERRL, EOMI, normal lids, iris ENT:  grossly normal hearing, lips & tongue, mmm Neck:  no LAD, masses or  thyromegaly Cardiovascular:  RRR, no m/r/g. No LE edema.  Respiratory:  CTA bilaterally, no w/r/r. Normal respiratory effort. Abdomen:  soft, ntnd, NABS Skin:  no rash or induration seen on limited exam Musculoskeletal:  grossly normal tone BUE/BLE, good ROM, no bony abnormality Psychiatric:  grossly normal mood and affect, speech fluent and appropriate, AOx3 Neurologic:  CN 2-12 grossly intact, moves all extremities in coordinated fashion,  sensation intact  Labs on Admission: I have personally reviewed following labs and imaging studies  CBC:  Recent Labs Lab 11/21/16 0701 11/26/16 1231 11/27/16 1215 11/27/16 1545  WBC 2.6* 4.8 7.0 8.0  NEUTROABS  --  2.6  --  5.6  HGB 9.1* 9.7* 9.8* 9.5*  HCT 27.3* 28.1* 28.8* 27.9*  MCV 89.8 84 87.0 87.5  PLT 203 293 266 024   Basic Metabolic Panel:  Recent Labs Lab 11/21/16 0701 11/27/16 1215  NA 136 133*  K 3.7 4.0  CL 104 95*  CO2 27 26  GLUCOSE 109* 109*  BUN 12 13  CREATININE 0.90 0.91  CALCIUM 8.5* 9.0   GFR: Estimated Creatinine Clearance: 39 mL/min (by C-G formula based on SCr of 0.91 mg/dL). Liver Function Tests:  Recent Labs Lab 11/21/16 0701  AST 14*  ALT 14  ALKPHOS 49  BILITOT 0.7  PROT 5.6*  ALBUMIN 2.6*   No results for input(s): LIPASE, AMYLASE in the last 168 hours. No results for input(s): AMMONIA in the last 168 hours. Coagulation Profile:  Recent Labs Lab 11/21/16 0701  INR 2.40   Cardiac Enzymes:  Recent Labs Lab 11/27/16 1332  TROPONINI <0.03   BNP (last 3 results) No results for input(s): PROBNP in the last 8760 hours. HbA1C: No results for input(s): HGBA1C in the last 72 hours. CBG:  Recent Labs Lab 11/27/16 1156 11/27/16 1210  GLUCAP 77 78   Lipid Profile: No results for input(s): CHOL, HDL, LDLCALC, TRIG, CHOLHDL, LDLDIRECT in the last 72 hours. Thyroid Function Tests: No results for input(s): TSH, T4TOTAL, FREET4, T3FREE, THYROIDAB in the last 72 hours. Anemia  Panel: No results for input(s): VITAMINB12, FOLATE, FERRITIN, TIBC, IRON, RETICCTPCT in the last 72 hours. Urine analysis:    Component Value Date/Time   COLORURINE YELLOW 11/27/2016 1400   APPEARANCEUR CLEAR 11/27/2016 1400   APPEARANCEUR Clear 09/10/2016 1114   LABSPEC 1.010 11/27/2016 1400   PHURINE 6.0 11/27/2016 1400   GLUCOSEU NEGATIVE 11/27/2016 1400   HGBUR NEGATIVE 11/27/2016 1400   BILIRUBINUR NEGATIVE 11/27/2016 1400   BILIRUBINUR Negative 09/10/2016 1114   KETONESUR NEGATIVE 11/27/2016 1400   PROTEINUR NEGATIVE 11/27/2016 1400   UROBILINOGEN 0.2 10/14/2011 1132   NITRITE NEGATIVE 11/27/2016 1400   LEUKOCYTESUR NEGATIVE 11/27/2016 1400   LEUKOCYTESUR Negative 09/10/2016 1114    Creatinine Clearance: Estimated Creatinine Clearance: 39 mL/min (by C-G formula based on SCr of 0.91 mg/dL).  Sepsis Labs: @LABRCNTIP (procalcitonin:4,lacticidven:4) )No results found for this or any previous visit (from the past 240 hour(s)).   Radiological Exams on Admission: Ct Head Wo Contrast  Result Date: 11/27/2016 CLINICAL DATA:  Patient status post fall.  Dizziness. EXAM: CT HEAD WITHOUT CONTRAST TECHNIQUE: Contiguous axial images were obtained from the base of the skull through the vertex without intravenous contrast. COMPARISON:  Brain CT 11/20/2016. FINDINGS: Brain: Periventricular and subcortical white matter hypodensity compatible with chronic microvascular ischemic changes. No evidence for acute cortically based infarct, intracranial hemorrhage, mass lesion or mass-effect. Old left basal ganglia lacunar infarct. Vascular: No hyperdense vessel or unexpected calcification. Skull: Intact. Sinuses/Orbits: Paranasal sinuses are well aerated. Mastoid air cells unremarkable. Other: None. IMPRESSION: No acute intracranial process. Atrophy and chronic microvascular ischemic changes. Electronically Signed   By: Lovey Newcomer M.D.   On: 11/27/2016 15:09   Ct Angio Chest Pe W And/or Wo  Contrast  Result Date: 11/27/2016 CLINICAL DATA:  Fall.  Chest pain.  Evaluate for pulmonary embolus. EXAM: CT ANGIOGRAPHY CHEST WITH CONTRAST TECHNIQUE: Multidetector CT  imaging of the chest was performed using the standard protocol during bolus administration of intravenous contrast. Multiplanar CT image reconstructions and MIPs were obtained to evaluate the vascular anatomy. CONTRAST:  70 cc of Isovue 370 COMPARISON:  PET-CT from 07/07/2016 FINDINGS: Cardiovascular: Normal heart size. Aortic atherosclerosis. Calcifications within the LAD, RCA and left circumflex coronary artery noted. No pericardial effusion. The main pulmonary artery is patent. No saddle embolus. Beyond the level of the main pulmonary artery is exam detail is diminished secondary to motion artifact. No lobar pulmonary artery filling defects identified. The upper lobe segmental pulmonary arteries appear patent. Diminished detail within the lower lobe pulmonary arteries due to motion artifact. Mediastinum/Nodes: The trachea appears patent and is midline. Normal appearance of the esophagus. Index right paratracheal node measures 6 mm, image 26 of series 4. Previously this lymph node measured 1.4 cm. Index right hilar node measures 1 cm, image 31 of series 4. Previously 1.3 cm. New left paratracheal lymph node measures 1 cm, image 25 of series 4. Lungs/Pleura: No pleural effusion. Right upper lobe paramediastinal mass measures 2.8 by 2.1 cm, image 25 of series 6. Previous 4.9 x 3.3 cm. There is a new area of masslike consolidation within the posteromedial right lower lobe measuring 2.4 x 3.8 cm, image 148 of series 5. Central area of cavitation is identified, image 48 of series 6. Masslike area of architectural distortion within the left upper lobe is new from previous exam, image number 21 of series 6. Upper Abdomen: No change in small nodule involving the right adrenal gland measuring 1 cm, image 77 of series 4. Area of low attenuation within  the lateral aspect of the right lobe of liver is unchanged from 07/07/2016 measuring 3.2 cm. Musculoskeletal: There are no aggressive lytic or sclerotic bone lesions identified. Degenerative disc disease is noted throughout the thoracic spine. Review of the MIP images confirms the above findings. IMPRESSION: 1. Exam detail is diminished due to respiratory motion artifact. Within this limitation no evidence for clinically significant acute pulmonary embolus identified. 2. The original index lesion within the paramediastinal right upper lobe is decreased in size from 07/07/2016. 3. There is a new partially cavitary lesion within the posteromedial right lower lobe which is indeterminate. Cannot rule out progression of disease. Recommend further evaluation with PET-CT. 4. Masslike area of architectural distortion in the left upper lobe is nonspecific but may reflect changes secondary to external beam radiation. Underlying tumor involvement cannot be excluded. 5. Decrease in size of right paratracheal lymph node with increase in size of left paratracheal node. 6. Aortic Atherosclerosis (ICD10-I70.0). Multi vessel coronary artery calcification noted. Electronically Signed   By: Kerby Moors M.D.   On: 11/27/2016 15:15    EKG: Independently reviewed.  Bigeminy with rate 100; nonspecific ST changes with no evidence of acute ischemia  Assessment/Plan Principal Problem:   Sepsis ([place]) Active Problems:   Essential hypertension, benign   Squamous cell carcinoma of right lung (HCC)   Atrial fibrillation (HCC) [I48.91]   Syncope  Pertinent labs:   Hgb 9.5, stable Troponin negative Lactate 1.8 Negative UA  Sepsis -Fever, tachycardia, tachypnea with normal lactate  -While awaiting blood cultures, this may be a preseptic condition. -Sepsis protocol initiated -No clear source of infection -  no abnormal PE or lab/Xray findings other than possible progression of her cancer -Blood and urine cultures  pending -Will admit with telemetry and continue to monitor -Treat with IV Vanc and Zosyn for undifferentiated sepsis -Will check procalcitonin  Syncope -  Patient previously observed for this last week, thought to be vasovagal -Today's episode happened while she was sitting in the bath, so vasovagal seems less likely -May be related to sepsis, as above -may also be related to possible progression of malignancy -Suggest oncology consult in the next 1-2 days  PAF -On Amiodarone and Warfarin -Will check INR  Lung CA -Completed chemo/rads but today's imaging indicates possible extension of disease -Suggest heme/onc consult on Monday    DVT prophylaxis: Coumadin Code Status: Full - confirmed with patient/family Family Communication: Daughters present throughout evaluation  Disposition Plan:  Home once clinically improved Consults called: Suggest oncology Monday  Admission status: Admit - It is my clinical opinion that admission to [place] is reasonable and necessary because this patient will require at least 2 midnights in the hospital to treat this condition based on the medical complexity of the problems presented.  Given the aforementioned information, the predictability of an adverse outcome is felt to be significant.    Karmen Bongo MD [facility]  If 7PM-7AM, please contact night-coverage [email or web address] Password [facility]  11/27/2016, 6:33 PM

## 2016-11-28 ENCOUNTER — Inpatient Hospital Stay (HOSPITAL_COMMUNITY): Payer: Medicare Other

## 2016-11-28 DIAGNOSIS — R55 Syncope and collapse: Secondary | ICD-10-CM

## 2016-11-28 DIAGNOSIS — D649 Anemia, unspecified: Secondary | ICD-10-CM | POA: Diagnosis present

## 2016-11-28 DIAGNOSIS — Z7901 Long term (current) use of anticoagulants: Secondary | ICD-10-CM

## 2016-11-28 LAB — BASIC METABOLIC PANEL
ANION GAP: 8 (ref 5–15)
BUN: 9 mg/dL (ref 6–20)
CHLORIDE: 106 mmol/L (ref 101–111)
CO2: 24 mmol/L (ref 22–32)
Calcium: 8.3 mg/dL — ABNORMAL LOW (ref 8.9–10.3)
Creatinine, Ser: 0.85 mg/dL (ref 0.44–1.00)
GFR calc Af Amer: >60 mL/min (ref >60)
GFR calc non Af Amer: >60 mL/min (ref >60)
GLUCOSE: 114 mg/dL — AB (ref 65–99)
POTASSIUM: 3.4 mmol/L — AB (ref 3.5–5.1)
Sodium: 138 mmol/L (ref 135–145)

## 2016-11-28 LAB — CBC
HEMATOCRIT: 22.9 % — AB (ref 36.0–46.0)
HEMOGLOBIN: 7.8 g/dL — AB (ref 12.0–15.0)
MCH: 29.8 pg (ref 26.0–34.0)
MCHC: 34.1 g/dL (ref 30.0–36.0)
MCV: 87.4 fL (ref 78.0–100.0)
Platelets: 258 10*3/uL (ref 150–400)
RBC: 2.62 MIL/uL — ABNORMAL LOW (ref 3.87–5.11)
RDW: 15.8 % — AB (ref 11.5–15.5)
WBC: 8.5 10*3/uL (ref 4.0–10.5)

## 2016-11-28 LAB — PROTIME-INR
INR: 2.83
Prothrombin Time: 30.3 seconds — ABNORMAL HIGH (ref 11.4–15.2)

## 2016-11-28 LAB — VITAMIN B12: Vitamin B-12: 293 pg/mL (ref 180–914)

## 2016-11-28 LAB — TSH: TSH: 0.327 u[IU]/mL — ABNORMAL LOW (ref 0.350–4.500)

## 2016-11-28 MED ORDER — POTASSIUM CHLORIDE CRYS ER 20 MEQ PO TBCR
20.0000 meq | EXTENDED_RELEASE_TABLET | Freq: Two times a day (BID) | ORAL | Status: AC
  Administered 2016-11-28 – 2016-11-29 (×4): 20 meq via ORAL
  Filled 2016-11-28 (×4): qty 1

## 2016-11-28 MED ORDER — SODIUM CHLORIDE 0.9 % IV SOLN: Freq: Once | INTRAVENOUS | Status: DC

## 2016-11-28 MED ORDER — WARFARIN - PHARMACIST DOSING INPATIENT
Freq: Every day | Status: DC
  Administered 2016-11-28 – 2016-12-04 (×2)

## 2016-11-28 MED ORDER — WARFARIN SODIUM 2 MG PO TABS
2.0000 mg | ORAL_TABLET | Freq: Once | ORAL | Status: AC
  Administered 2016-11-28: 2 mg via ORAL
  Filled 2016-11-28: qty 1

## 2016-11-28 MED ORDER — POTASSIUM CHLORIDE IN NACL 20-0.9 MEQ/L-% IV SOLN
INTRAVENOUS | Status: DC
  Administered 2016-11-28 – 2016-11-29 (×2): via INTRAVENOUS

## 2016-11-28 MED ORDER — LISINOPRIL 5 MG PO TABS
2.5000 mg | ORAL_TABLET | Freq: Every day | ORAL | Status: DC
  Administered 2016-11-29: 2.5 mg via ORAL
  Filled 2016-11-28 (×2): qty 1

## 2016-11-28 NOTE — ED Provider Notes (Signed)
[facility] DEPT Provider Note   CSN: [id number] Arrival date & time: 11/27/16  1137     History   Chief Complaint Chief Complaint  Patient presents with  . Dizziness  . Fall    HPI Alexandria Chen is a 82 y.o. female.   Weakness  Primary symptoms include no focal weakness. This is a new problem. The current episode started yesterday. The problem has been gradually worsening. There was no focality noted. There has been no fever. Associated symptoms include vomiting and confusion. Pertinent negatives include no shortness of breath. There were no medications administered prior to arrival.    Past Medical History:  Diagnosis Date  . Alcohol abuse, in remission   . Anxiety   . Atrial fibrillation ([place])   . Depression   . Dyspnea    walking distances   . Dysrhythmia   . History of bronchitis   . HTN (hypertension)   . Mass of right lung 07/13/2016  . Osteopenia   . Squamous cell carcinoma of right lung ([place]) 07/13/2016    Patient Active Problem List   Diagnosis Date Noted  . Anemia, unspecified 11/28/2016  . Warfarin anticoagulation 11/28/2016  . Sepsis ([place]) 11/27/2016  . Syncope 11/20/2016  . Atrial fibrillation ([place]) [I48.91] 09/09/2016  . Squamous cell carcinoma of right lung ([place]) 07/13/2016  . Syncope and collapse 04/14/2015  . Otitis externa of right ear 12/18/2014  . Hearing loss due to cerumen impaction 12/18/2014  . Essential hypertension, benign 01/14/2014  . Hyperlipidemia 01/14/2014  . Vitamin D deficiency 01/14/2014  . Alcohol dependence in remission ([place]) 10/15/2011    Class: Acute  . Depression 08/31/2011  . Anxiety 08/31/2011    Past Surgical History:  Procedure Laterality Date  . IR FLUORO GUIDE PORT INSERTION RIGHT  09/17/2016  . IR US GUIDE VASC ACCESS RIGHT  09/17/2016    OB History    No data available       Home Medications    Prior to Admission medications   Medication Sig Start Date End Date Taking? Authorizing Provider    amiodarone (PACERONE) 200 MG tablet Take 200 mg by mouth daily.  08/04/16  Yes [provider]  aspirin EC 81 MG tablet Take 81 mg by mouth daily.   Yes [provider]  atorvastatin (LIPITOR) 40 MG tablet TAKE 1 TABLET (40 MG TOTAL) BY MOUTH DAILY. FOR HYPERLIPIDEMIA 05/29/16  Yes Hawks, Christy A, FNP  busPIRone (BUSPAR) 15 MG tablet Take 1 tablet (15 mg total) by mouth 2 (two) times daily. 08/02/16  Yes Cloria Spring, MD  Calcium Carb-Cholecalciferol (CALCIUM 500+D3) 500-400 MG-UNIT TABS Take 1 tablet by mouth 2 (two) times daily.   Yes [provider]  docusate calcium (SURFAK) 240 MG capsule Take 1 capsule (240 mg total) by mouth daily as needed for mild constipation. 11/01/16  Yes Twana First, MD  DULoxetine (CYMBALTA) 60 MG capsule Take 1 capsule (60 mg total) by mouth daily. 08/02/16 08/02/17 Yes Cloria Spring, MD  fish oil-omega-3 fatty acids 1000 MG capsule Take 3 capsules (3 g total) by mouth daily. For hyperlipidemia. Patient taking differently: Take 1 g by mouth 3 (three) times daily. For hyperlipidemia. 10/19/11  Yes Mashburn, Marlane Hatcher, PA-C  fluticasone (FLONASE) 50 MCG/ACT nasal spray Place 2 sprays into both nostrils 2 (two) times daily. Use as needed Patient taking differently: Place 2 sprays into both nostrils 2 (two) times daily as needed for allergies. Use as needed 11/05/15  Yes Hawks, [place]  A, FNP  gabapentin (NEURONTIN) 100 MG capsule Take 1 capsule (100 mg total) by mouth at bedtime. 08/02/16  Yes Cloria Spring, MD  lisinopril (PRINIVIL,ZESTRIL) 5 MG tablet Take 5 mg by mouth. 08/25/16  Yes [provider]  meclizine (ANTIVERT) 25 MG tablet Take 25 mg by mouth 3 (three) times daily as needed for dizziness.   Yes [provider]  nitroGLYCERIN (NITROSTAT) 0.4 MG SL tablet Place 0.4 mg under the tongue. 07/13/16  Yes [provider]  omeprazole (PRILOSEC) 40 MG capsule Take 1 capsule (40 mg total) by mouth daily. 11/01/16  Yes Twana First, MD  warfarin (COUMADIN) 2.5 MG tablet Take 2.5 mg by mouth daily.    Yes [provider]    Family History Family History  Problem Relation Age of Onset  . Anxiety disorder Mother   . Dementia Mother   . Hypertension Mother   . Alcohol abuse Father   . Arthritis Father   . Alcohol abuse Brother   . ADD / ADHD Neg Hx   . Drug abuse Neg Hx   . Bipolar disorder Neg Hx   . Depression Neg Hx   . OCD Neg Hx   . Paranoid behavior Neg Hx   . Schizophrenia Neg Hx   . Seizures Neg Hx   . Sexual abuse Neg Hx   . Physical abuse Neg Hx     Social History Social History  Substance Use Topics  . Smoking status: Former Smoker    Packs/day: 2.00    Years: 60.00    Types: Cigarettes    Start date: 05/03/1956    Quit date: 07/15/2016  . Smokeless tobacco: Never Used  . Alcohol use No     Comment: Patient reports no alcohol use since 05/24/2016.     Allergies   Patient has no known allergies.   Review of Systems Review of Systems  Respiratory: Negative for shortness of breath.   Gastrointestinal: Positive for vomiting.  Neurological: Positive for weakness. Negative for focal weakness.  Psychiatric/Behavioral: Positive for confusion.  All other systems reviewed and are negative.    Physical Exam Updated Vital Signs BP (!) 114/57 (BP Location: Left Arm)   Pulse 93   Temp 99.3 F (37.4 C) (Oral)   Resp 18   Ht 5\' 2"  (1.575 m)   Wt 59.8 kg (131 lb 13.4 oz)   SpO2 100%   BMI 24.11 kg/m   Physical Exam  Constitutional: She is oriented to person, place, and time. She appears well-developed and well-nourished.  HENT:  Head: Normocephalic and atraumatic.  Eyes: Conjunctivae and EOM are normal.  Neck: Normal range of motion.  Cardiovascular: Normal rate and regular rhythm.   Pulmonary/Chest: Effort normal and breath sounds normal. No stridor. No respiratory distress. She has no wheezes.  Abdominal: Soft. She exhibits no distension.  Musculoskeletal: Normal  range of motion. She exhibits no edema or deformity.  Neurological: She is alert and oriented to person, place, and time. No cranial nerve deficit. Coordination normal.  Skin: Skin is warm and dry.  Nursing note and vitals reviewed.    ED Treatments / Results  Labs (all labs ordered are listed, but only abnormal results are displayed) Labs Reviewed  BASIC METABOLIC PANEL - Abnormal; Notable for the following:       Result Value   Sodium 133 (*)    Chloride 95 (*)    Glucose, Bld 109 (*)    GFR calc non Af Amer 58 (*)  All other components within normal limits  CBC - Abnormal; Notable for the following:    RBC 3.31 (*)    Hemoglobin 9.8 (*)    HCT 28.8 (*)    All other components within normal limits  CBC WITH DIFFERENTIAL/PLATELET - Abnormal; Notable for the following:    RBC 3.19 (*)    Hemoglobin 9.5 (*)    HCT 27.9 (*)    Monocytes Absolute 1.2 (*)    All other components within normal limits  BASIC METABOLIC PANEL - Abnormal; Notable for the following:    Potassium 3.4 (*)    Glucose, Bld 114 (*)    Calcium 8.3 (*)    All other components within normal limits  CBC - Abnormal; Notable for the following:    RBC 2.62 (*)    Hemoglobin 7.8 (*)    HCT 22.9 (*)    RDW 15.8 (*)    All other components within normal limits  PROTIME-INR - Abnormal; Notable for the following:    Prothrombin Time 32.0 (*)    All other components within normal limits  APTT - Abnormal; Notable for the following:    aPTT 89 (*)    All other components within normal limits  PROTIME-INR - Abnormal; Notable for the following:    Prothrombin Time 30.3 (*)    All other components within normal limits  TSH - Abnormal; Notable for the following:    TSH 0.327 (*)    All other components within normal limits  CULTURE, BLOOD (ROUTINE X 2)  CULTURE, BLOOD (ROUTINE X 2)  URINE CULTURE  URINALYSIS, ROUTINE W REFLEX MICROSCOPIC  LACTIC ACID, PLASMA  LACTIC ACID, PLASMA  TROPONIN I  PROCALCITONIN    VITAMIN B12  OCCULT BLOOD X 1 CARD TO LAB, STOOL  PROTIME-INR  OCCULT BLOOD X 1 CARD TO LAB, STOOL  CBC  BASIC METABOLIC PANEL  CBG MONITORING, ED  CBG MONITORING, ED  TYPE AND SCREEN    EKG  EKG Interpretation  Date/Time:  Saturday November 27 2016 12:10:02 EDT Ventricular Rate:  100 PR Interval:    QRS Duration: 91 QT Interval:  352 QTC Calculation: 454 R Axis:   32 Text Interpretation:  Sinus tachycardia Supraventricular bigeminy Abnormal R-wave progression, early transition Confirmed by Merrily Pew [phone]) on 11/27/2016 2:05:28 PM       Radiology Ct Head Wo Contrast  Result Date: 11/27/2016 CLINICAL DATA:  Patient status post fall.  Dizziness. EXAM: CT HEAD WITHOUT CONTRAST TECHNIQUE: Contiguous axial images were obtained from the base of the skull through the vertex without intravenous contrast. COMPARISON:  Brain CT 11/20/2016. FINDINGS: Brain: Periventricular and subcortical white matter hypodensity compatible with chronic microvascular ischemic changes. No evidence for acute cortically based infarct, intracranial hemorrhage, mass lesion or mass-effect. Old left basal ganglia lacunar infarct. Vascular: No hyperdense vessel or unexpected calcification. Skull: Intact. Sinuses/Orbits: Paranasal sinuses are well aerated. Mastoid air cells unremarkable. Other: None. IMPRESSION: No acute intracranial process. Atrophy and chronic microvascular ischemic changes. Electronically Signed   By: Lovey Newcomer M.D.   On: 11/27/2016 15:09   Ct Angio Chest Pe W And/or Wo Contrast  Result Date: 11/27/2016 CLINICAL DATA:  Fall.  Chest pain.  Evaluate for pulmonary embolus. EXAM: CT ANGIOGRAPHY CHEST WITH CONTRAST TECHNIQUE: Multidetector CT imaging of the chest was performed using the standard protocol during bolus administration of intravenous contrast. Multiplanar CT image reconstructions and MIPs were obtained to evaluate the vascular anatomy. CONTRAST:  70 cc of Isovue 370 COMPARISON:  PET-CT  from 07/07/2016 FINDINGS: Cardiovascular: Normal heart size. Aortic atherosclerosis. Calcifications within the LAD, RCA and left circumflex coronary artery noted. No pericardial effusion. The main pulmonary artery is patent. No saddle embolus. Beyond the level of the main pulmonary artery is exam detail is diminished secondary to motion artifact. No lobar pulmonary artery filling defects identified. The upper lobe segmental pulmonary arteries appear patent. Diminished detail within the lower lobe pulmonary arteries due to motion artifact. Mediastinum/Nodes: The trachea appears patent and is midline. Normal appearance of the esophagus. Index right paratracheal node measures 6 mm, image 26 of series 4. Previously this lymph node measured 1.4 cm. Index right hilar node measures 1 cm, image 31 of series 4. Previously 1.3 cm. New left paratracheal lymph node measures 1 cm, image 25 of series 4. Lungs/Pleura: No pleural effusion. Right upper lobe paramediastinal mass measures 2.8 by 2.1 cm, image 25 of series 6. Previous 4.9 x 3.3 cm. There is a new area of masslike consolidation within the posteromedial right lower lobe measuring 2.4 x 3.8 cm, image 148 of series 5. Central area of cavitation is identified, image 48 of series 6. Masslike area of architectural distortion within the left upper lobe is new from previous exam, image number 21 of series 6. Upper Abdomen: No change in small nodule involving the right adrenal gland measuring 1 cm, image 77 of series 4. Area of low attenuation within the lateral aspect of the right lobe of liver is unchanged from 07/07/2016 measuring 3.2 cm. Musculoskeletal: There are no aggressive lytic or sclerotic bone lesions identified. Degenerative disc disease is noted throughout the thoracic spine. Review of the MIP images confirms the above findings. IMPRESSION: 1. Exam detail is diminished due to respiratory motion artifact. Within this limitation no evidence for clinically significant  acute pulmonary embolus identified. 2. The original index lesion within the paramediastinal right upper lobe is decreased in size from 07/07/2016. 3. There is a new partially cavitary lesion within the posteromedial right lower lobe which is indeterminate. Cannot rule out progression of disease. Recommend further evaluation with PET-CT. 4. Masslike area of architectural distortion in the left upper lobe is nonspecific but may reflect changes secondary to external beam radiation. Underlying tumor involvement cannot be excluded. 5. Decrease in size of right paratracheal lymph node with increase in size of left paratracheal node. 6. Aortic Atherosclerosis (ICD10-I70.0). Multi vessel coronary artery calcification noted. Electronically Signed   By: Kerby Moors M.D.   On: 11/27/2016 15:15    Procedures Procedures (including critical care time)  Medications Ordered in ED Medications  aspirin EC tablet 81 mg (81 mg Oral Given 11/28/16 0928)  pantoprazole (PROTONIX) EC tablet 40 mg (40 mg Oral Given 11/28/16 0928)  meclizine (ANTIVERT) tablet 25 mg (not administered)  busPIRone (BUSPAR) tablet 15 mg (15 mg Oral Given 11/28/16 2252)  DULoxetine (CYMBALTA) DR capsule 60 mg (60 mg Oral Given 11/28/16 0928)  gabapentin (NEURONTIN) capsule 100 mg (100 mg Oral Given 11/28/16 2253)  atorvastatin (LIPITOR) tablet 40 mg (40 mg Oral Given 11/28/16 1709)  acetaminophen (TYLENOL) tablet 650 mg (650 mg Oral Given 11/28/16 1954)    Or  acetaminophen (TYLENOL) suppository 650 mg ( Rectal See Alternative 11/28/16 1954)  docusate sodium (COLACE) capsule 100 mg (100 mg Oral Given 11/28/16 2253)  ondansetron (ZOFRAN) tablet 4 mg (not administered)    Or  ondansetron (ZOFRAN) injection 4 mg (not administered)  sodium chloride flush (NS) 0.9 % injection 3 mL (3 mLs Intravenous Not Given 11/28/16  2200)  vancomycin (VANCOCIN) IVPB 1000 mg/200 mL premix (0 mg Intravenous Stopped 11/28/16 1607)  piperacillin-tazobactam (ZOSYN) IVPB  3.375 g (3.375 g Intravenous New Bag/Given 11/28/16 2253)  amiodarone (PACERONE) tablet 200 mg (200 mg Oral Given 11/28/16 0928)  Warfarin - Pharmacist Dosing Inpatient ( Does not apply Given 11/28/16 1600)  lisinopril (PRINIVIL,ZESTRIL) tablet 2.5 mg (not administered)  0.9 %  sodium chloride infusion ( Intravenous Not Given 11/28/16 1430)  potassium chloride SA (K-DUR,KLOR-CON) CR tablet 20 mEq (20 mEq Oral Given 11/28/16 2252)  0.9 % NaCl with KCl 20 mEq/ L  infusion ( Intravenous New Bag/Given 11/28/16 1507)  sodium chloride 0.9 % bolus 1,000 mL (0 mLs Intravenous Stopped 11/27/16 1544)  iopamidol (ISOVUE-370) 76 % injection 100 mL (100 mLs Intravenous Contrast Given 11/27/16 1428)  sodium chloride 0.9 % bolus 1,000 mL (0 mLs Intravenous Stopped 11/27/16 1804)  acetaminophen (TYLENOL) tablet 1,000 mg (1,000 mg Oral Given 11/27/16 1544)  vancomycin (VANCOCIN) IVPB 1000 mg/200 mL premix (0 mg Intravenous Stopped 11/28/16 0518)  piperacillin-tazobactam (ZOSYN) IVPB 3.375 g (0 g Intravenous Stopped 11/28/16 0519)  warfarin (COUMADIN) tablet 2 mg (2 mg Oral Given 11/28/16 1507)     Initial Impression / Assessment and Plan / ED Course  I have reviewed the triage vital signs and the nursing notes.  Pertinent labs & imaging results that were available during my care of the patient were reviewed by me and considered in my medical decision making (see chart for details).     Likely infection of uncertain etiology. Has weakness and falls as well, recent chemo/radiation so weakened immunity. Plan for admission for cultures/antibiotics/observation.   Final Clinical Impressions(s) / ED Diagnoses   Final diagnoses:  Fever, unspecified  Syncope, unspecified syncope type      Merrily Pew, MD 11/28/16 [phone]

## 2016-11-28 NOTE — Progress Notes (Signed)
  Echocardiogram 2D Echocardiogram has been performed.  Wasif Simonich 11/28/2016, 5:36 PM

## 2016-11-28 NOTE — Progress Notes (Signed)
Paged Dr. Darrick Meigs with updated vital signs and temperature.  Patient's temp is still 102.9, given Tylenol 650mg  at 0440, Zosyn #2 started.  Procalcitonin indicates possible bacterial infection.

## 2016-11-28 NOTE — Progress Notes (Signed)
Paged mid-level through [facility] to advise about patient's HR ranging from 120-140 and spike in temperature to 102.8.

## 2016-11-28 NOTE — Progress Notes (Signed)
ANTICOAGULATION CONSULT NOTE - Initial Consult  Pharmacy Consult for Coumadin Indication: atrial fibrillation  No Known Allergies  Patient Measurements: Height: 5\' 2"  (157.5 cm) Weight: 131 lb 13.4 oz (59.8 kg) IBW/kg (Calculated) : 50.1  Vital Signs: Temp: 99.1 F (37.3 C) (07/29 0948) Temp Source: Oral (07/29 0948) BP: 158/71 (07/29 0440) Pulse Rate: 108 (07/29 0440)  Labs:  Recent Labs  11/27/16 1215 11/27/16 1332 11/27/16 1545 11/27/16 2008 11/28/16 0711  HGB 9.8*  --  9.5*  --  7.8*  HCT 28.8*  --  27.9*  --  22.9*  PLT 266  --  272  --  258  APTT  --   --   --  89*  --   LABPROT  --   --   --  32.0* 30.3*  INR  --   --   --  3.03 2.83  CREATININE 0.91  --   --   --  0.85  TROPONINI  --  <0.03  --   --   --     Estimated Creatinine Clearance: 41.8 mL/min (by C-G formula based on SCr of 0.85 mg/dL).   Medical History: Past Medical History:  Diagnosis Date  . Alcohol abuse, in remission   . Anxiety   . Atrial fibrillation ([place])   . Depression   . Dyspnea    walking distances   . Dysrhythmia   . History of bronchitis   . HTN (hypertension)   . Mass of right lung 07/13/2016  . Osteopenia   . Squamous cell carcinoma of right lung ([place]) 07/13/2016    Medications:  Prescriptions Prior to Admission  Medication Sig Dispense Refill Last Dose  . amiodarone (PACERONE) 200 MG tablet Take 200 mg by mouth daily.    11/26/2016 at Unknown time  . aspirin EC 81 MG tablet Take 81 mg by mouth daily.   11/26/2016 at Unknown time  . atorvastatin (LIPITOR) 40 MG tablet TAKE 1 TABLET (40 MG TOTAL) BY MOUTH DAILY. FOR HYPERLIPIDEMIA 90 tablet 1 11/26/2016 at Unknown time  . busPIRone (BUSPAR) 15 MG tablet Take 1 tablet (15 mg total) by mouth 2 (two) times daily. 180 tablet 2 11/26/2016 at Unknown time  . Calcium Carb-Cholecalciferol (CALCIUM 500+D3) 500-400 MG-UNIT TABS Take 1 tablet by mouth 2 (two) times daily.   11/26/2016 at Unknown time  . docusate calcium (SURFAK) 240 MG  capsule Take 1 capsule (240 mg total) by mouth daily as needed for mild constipation. 30 capsule 1 11/26/2016 at Unknown time  . DULoxetine (CYMBALTA) 60 MG capsule Take 1 capsule (60 mg total) by mouth daily. 30 capsule 2 11/26/2016 at Unknown time  . fish oil-omega-3 fatty acids 1000 MG capsule Take 3 capsules (3 g total) by mouth daily. For hyperlipidemia. (Patient taking differently: Take 1 g by mouth 3 (three) times daily. For hyperlipidemia.) 30 capsule  11/26/2016 at Unknown time  . fluticasone (FLONASE) 50 MCG/ACT nasal spray Place 2 sprays into both nostrils 2 (two) times daily. Use as needed (Patient taking differently: Place 2 sprays into both nostrils 2 (two) times daily as needed for allergies. Use as needed) 16 g 6 11/26/2016 at Unknown time  . gabapentin (NEURONTIN) 100 MG capsule Take 1 capsule (100 mg total) by mouth at bedtime. 90 capsule 2 11/26/2016 at Unknown time  . lisinopril (PRINIVIL,ZESTRIL) 5 MG tablet Take 5 mg by mouth.   11/26/2016 at Unknown time  . meclizine (ANTIVERT) 25 MG tablet Take 25 mg by mouth 3 (three)  times daily as needed for dizziness.   11/26/2016 at Unknown time  . nitroGLYCERIN (NITROSTAT) 0.4 MG SL tablet Place 0.4 mg under the tongue.   11/26/2016 at Unknown time  . omeprazole (PRILOSEC) 40 MG capsule Take 1 capsule (40 mg total) by mouth daily. 30 capsule 2 11/26/2016 at Unknown time  . warfarin (COUMADIN) 2.5 MG tablet Take 2.5 mg by mouth daily.    11/26/2016 at 0800    Assessment: 82 y.o. female with medical history significant of recent diagnosis of atrial fibrillation, on chronic anticoagulation. [place] cancer status post radiation and chemotherapy with admission 7/21-22 for probably vasovagal syncope. Patient reported feeling weak, light-headed, and dizzy with extreme fatigue. Admitted with sepsis. Pharmacy consulted to manage coumadin INR 2.83 today. No dose given yesterday evening(last dose taken 7/27). INR on upper end of range, will adjust dose slightly  lower.  Goal of Therapy:  INR 2-3 Monitor platelets by anticoagulation protocol: Yes   Plan:  Coumadin 2 mg x 1 today PT-INR daily Monitor for S/S of bleeding  Isac Sarna, BS Vena [place], BCPS Clinical Pharmacist Pager [phone] 11/28/2016,12:17 PM

## 2016-11-28 NOTE — Progress Notes (Signed)
PROGRESS NOTE    Alexandria Chen  HGD:[id number] DOB: 1934-09-15 DOA: 11/27/2016 PCP: [name], FNP    Brief Narrative:  Patient is a 82 y.o. female with medical history significant of recent diagnosis of atrial fibrillation, on anticoagulation, lung cancer status post radiation and chemotherapy and recent hospitalization from 7/21-7/22 probably vasovagal syncope.  Since discharge, she had been falling lately at home. She becomes lightheaded and falls then does not respond. It doesn't appear that she loses consciousness completely. No reported head trauma, but she bruised her left upper back with the most recent fall. She recently completed a course of chemotherapy and radiation therapy for lung cancer. She denied fever, but had a cough treated with Mucinex and she has had intermittent nausea and vomiting, but none since last week. In the ED, she was febrile with a temperature of 101.2, mildly tachycardic, and with a relatively normal blood pressure. Her white blood cell count was within normal limits. Her hemoglobin was 9.7. Her UA was unremarkable. Head CT revealed no acute intracranial process. Chest CT revealed a number of abnormalities, but no evidence of acute PE. She was admitted for further evaluation and management.  Assessment & Plan:   Principal Problem:   Syncope and collapse Active Problems:   Sepsis ([place])   Essential hypertension, benign   Squamous cell carcinoma of right lung (HCC)   Atrial fibrillation (HCC) [I48.91]   Anemia, unspecified   Warfarin anticoagulation   1. Syncopal collapse. This is the patient's second hospitalization in 1 week for the same. According to her daughter, she has had intermittent syncopal episodes in the past so this is not new for the patient. Workup during the previous hospitalization revealed probable vasovagal syncope. Head CT was essentially negative. -Patient was started on supportive treatment and IV fluids. -Orthostatic vital signs  were ordered and the patient was orthostatic with her systolic blood pressure falling to the 80s. This could be the possible etiology in the setting of sepsis and malignancy. -For further evaluation, will order 2-D echo and carotid ultrasound. We'll check her thyroid function and vitamin B12 levels. -Continue to monitor orthostatic vital signs and IV fluids. If her standing blood pressure does not improve with IV fluids and treatment of sepsis, consider starting Midodrine or Florinef.  Early sepsis unknown etiology. Patient was febrile on admission. Her fever following admission increased to 102.26F. Her chest x-ray and urinalysis were unremarkable. Lactic acid and procalcitonin were within normal limits. -Empiric broad-spectrum antibiotics were started with Zosyn and vancomycin. -Blood cultures were ordered on admission and are still pending. A urine culture was ordered and is pending. -Continue supportive treatment.  Paroxysmal atrial fibrillation. Patient is treated chronically with amiodarone and warfarin. Following admission, her INR was therapeutic at 3. -Continue current treatment.  Essential hypertension. Patient is treated chronically with lisinopril. It was continued. -Per orthostatic vital signs, her blood pressure in the sitting position was in the low 100s. -Will decrease lisinopril from 5-2.5 mg daily.  Normocytic anemia. Patient's hemoglobin was 9.5 on admission. The baseline appears to be 9.5-10.8. She probably has anemia of chronic disease from lung cancer. -Her hemoglobin has fallen to 7.8 which may be associated with hemodilution. -We'll monitor her hemoglobin/hematocrit daily or more frequently as needed. Will Hemoccult her stools. We'll type and screen her. -We'll continue Protonix.  Squamous carcinoma of the lung. Patient is followed by oncology at [facility]. She recently completed XRT and chemotherapy. Her chest CT revealed a number of abnormalities; per radiology the  new  partially cavitary lesion within the posterior medial right lower lobe was indeterminate, could not rule out progression. The scan also revealed decrease in size in a number of nodes. Burnis Medin consider inpatient oncology consultation if clinically warranted. Otherwise, patient can follow-up as an outpatient.   DVT prophylaxis: Warfarin Code Status: Full code Family Communication: Discussed with daughter Disposition Plan: Discharge when clinically appropriate, likely in a few days   Consultants:   None  Procedures:   Echo pending  Carotid ultrasound pending  Antimicrobials:   Vancomycin 7/28>>  Zosyn 7/28>>   Subjective: Patient denies chest pain, shortness of breath, abdominal pain, diarrhea, or pain with urination. She did feel lightheaded when she stood up with nursing.  Objective: Vitals:   11/28/16 0025 11/28/16 0440 11/28/16 0948 11/28/16 1223  BP: (!) 132/54 (!) 158/71    Pulse: (!) 117 (!) 108    Resp: 20 18    Temp: (!) 102.8 F (39.3 C) (!) 102.9 F (39.4 C) 99.1 F (37.3 C) 98.8 F (37.1 C)  TempSrc: Oral Oral Oral Oral  SpO2: 96% 95%    Weight:      Height:        Intake/Output Summary (Last 24 hours) at 11/28/16 1328 Last data filed at 11/28/16 0500  Gross per 24 hour  Intake          1606.67 ml  Output                0 ml  Net          1606.67 ml   Filed Weights   11/27/16 1146 11/27/16 1845  Weight: 58.5 kg (129 lb) 59.8 kg (131 lb 13.4 oz)    Examination:  General exam: Appears calm and comfortable; Sitting in the chair.  Respiratory system: Clear anteriorly with decreased breath sounds in the bases. Respiratory effort normal. Cardiovascular system: Irregular, irregular. No pedal edema. Gastrointestinal system: Abdomen is nondistended, soft and nontender. No organomegaly or masses felt. Normal bowel sounds heard. Central nervous system: Alert and oriented. No focal neurological deficits. Cranial nerves II through XII grossly  intact. Extremities: No acute hot red joints Skin: No rashes, lesions or ulcers Psychiatry: Judgement and insight appear normal. Mood & affect appropriate.     Data Reviewed: I have personally reviewed following labs and imaging studies  CBC:  Recent Labs Lab 11/26/16 1231 11/27/16 1215 11/27/16 1545 11/28/16 0711  WBC 4.8 7.0 8.0 8.5  NEUTROABS 2.6  --  5.6  --   HGB 9.7* 9.8* 9.5* 7.8*  HCT 28.1* 28.8* 27.9* 22.9*  MCV 84 87.0 87.5 87.4  PLT 293 266 272 676   Basic Metabolic Panel:  Recent Labs Lab 11/27/16 1215 11/28/16 0711  NA 133* 138  K 4.0 3.4*  CL 95* 106  CO2 26 24  GLUCOSE 109* 114*  BUN 13 9  CREATININE 0.91 0.85  CALCIUM 9.0 8.3*   GFR: Estimated Creatinine Clearance: 41.8 mL/min (by C-G formula based on SCr of 0.85 mg/dL). Liver Function Tests: No results for input(s): AST, ALT, ALKPHOS, BILITOT, PROT, ALBUMIN in the last 168 hours. No results for input(s): LIPASE, AMYLASE in the last 168 hours. No results for input(s): AMMONIA in the last 168 hours. Coagulation Profile:  Recent Labs Lab 11/27/16 2008 11/28/16 0711  INR 3.03 2.83   Cardiac Enzymes:  Recent Labs Lab 11/27/16 1332  TROPONINI <0.03   BNP (last 3 results) No results for input(s): PROBNP in the last 8760 hours. HbA1C: No  results for input(s): HGBA1C in the last 72 hours. CBG:  Recent Labs Lab 11/27/16 1156 11/27/16 1210  GLUCAP 77 78   Lipid Profile: No results for input(s): CHOL, HDL, LDLCALC, TRIG, CHOLHDL, LDLDIRECT in the last 72 hours. Thyroid Function Tests:  Recent Labs  11/27/16 2008  TSH 0.327*   Anemia Panel: No results for input(s): VITAMINB12, FOLATE, FERRITIN, TIBC, IRON, RETICCTPCT in the last 72 hours. Sepsis Labs:  Recent Labs Lab 11/27/16 1332 11/27/16 1545 11/27/16 2008  PROCALCITON  --   --  0.36  LATICACIDVEN 1.8 1.5  --     Recent Results (from the past 240 hour(s))  Blood Culture (routine x 2)     Status: None (Preliminary  result)   Collection Time: 11/27/16  3:46 PM  Result Value Ref Range Status   Specimen Description BLOOD  Final   Special Requests NONE  Final   Culture NO GROWTH < 24 HOURS  Final   Report Status PENDING  Incomplete  Blood Culture (routine x 2)     Status: None (Preliminary result)   Collection Time: 11/27/16  3:48 PM  Result Value Ref Range Status   Specimen Description BLOOD  Final   Special Requests NONE  Final   Culture NO GROWTH < 24 HOURS  Final   Report Status PENDING  Incomplete         Radiology Studies: Ct Head Wo Contrast  Result Date: 11/27/2016 CLINICAL DATA:  Patient status post fall.  Dizziness. EXAM: CT HEAD WITHOUT CONTRAST TECHNIQUE: Contiguous axial images were obtained from the base of the skull through the vertex without intravenous contrast. COMPARISON:  Brain CT 11/20/2016. FINDINGS: Brain: Periventricular and subcortical white matter hypodensity compatible with chronic microvascular ischemic changes. No evidence for acute cortically based infarct, intracranial hemorrhage, mass lesion or mass-effect. Old left basal ganglia lacunar infarct. Vascular: No hyperdense vessel or unexpected calcification. Skull: Intact. Sinuses/Orbits: Paranasal sinuses are well aerated. Mastoid air cells unremarkable. Other: None. IMPRESSION: No acute intracranial process. Atrophy and chronic microvascular ischemic changes. Electronically Signed   By: Lovey Newcomer M.D.   On: 11/27/2016 15:09   Ct Angio Chest Pe W And/or Wo Contrast  Result Date: 11/27/2016 CLINICAL DATA:  Fall.  Chest pain.  Evaluate for pulmonary embolus. EXAM: CT ANGIOGRAPHY CHEST WITH CONTRAST TECHNIQUE: Multidetector CT imaging of the chest was performed using the standard protocol during bolus administration of intravenous contrast. Multiplanar CT image reconstructions and MIPs were obtained to evaluate the vascular anatomy. CONTRAST:  70 cc of Isovue 370 COMPARISON:  PET-CT from 07/07/2016 FINDINGS: Cardiovascular:  Normal heart size. Aortic atherosclerosis. Calcifications within the LAD, RCA and left circumflex coronary artery noted. No pericardial effusion. The main pulmonary artery is patent. No saddle embolus. Beyond the level of the main pulmonary artery is exam detail is diminished secondary to motion artifact. No lobar pulmonary artery filling defects identified. The upper lobe segmental pulmonary arteries appear patent. Diminished detail within the lower lobe pulmonary arteries due to motion artifact. Mediastinum/Nodes: The trachea appears patent and is midline. Normal appearance of the esophagus. Index right paratracheal node measures 6 mm, image 26 of series 4. Previously this lymph node measured 1.4 cm. Index right hilar node measures 1 cm, image 31 of series 4. Previously 1.3 cm. New left paratracheal lymph node measures 1 cm, image 25 of series 4. Lungs/Pleura: No pleural effusion. Right upper lobe paramediastinal mass measures 2.8 by 2.1 cm, image 25 of series 6. Previous 4.9 x 3.3 cm. There is  a new area of masslike consolidation within the posteromedial right lower lobe measuring 2.4 x 3.8 cm, image 148 of series 5. Central area of cavitation is identified, image 48 of series 6. Masslike area of architectural distortion within the left upper lobe is new from previous exam, image number 21 of series 6. Upper Abdomen: No change in small nodule involving the right adrenal gland measuring 1 cm, image 77 of series 4. Area of low attenuation within the lateral aspect of the right lobe of liver is unchanged from 07/07/2016 measuring 3.2 cm. Musculoskeletal: There are no aggressive lytic or sclerotic bone lesions identified. Degenerative disc disease is noted throughout the thoracic spine. Review of the MIP images confirms the above findings. IMPRESSION: 1. Exam detail is diminished due to respiratory motion artifact. Within this limitation no evidence for clinically significant acute pulmonary embolus identified. 2.  The original index lesion within the paramediastinal right upper lobe is decreased in size from 07/07/2016. 3. There is a new partially cavitary lesion within the posteromedial right lower lobe which is indeterminate. Cannot rule out progression of disease. Recommend further evaluation with PET-CT. 4. Masslike area of architectural distortion in the left upper lobe is nonspecific but may reflect changes secondary to external beam radiation. Underlying tumor involvement cannot be excluded. 5. Decrease in size of right paratracheal lymph node with increase in size of left paratracheal node. 6. Aortic Atherosclerosis (ICD10-I70.0). Multi vessel coronary artery calcification noted. Electronically Signed   By: Kerby Moors M.D.   On: 11/27/2016 15:15        Scheduled Meds: . amiodarone  200 mg Oral Daily  . aspirin EC  81 mg Oral Daily  . atorvastatin  40 mg Oral q1800  . busPIRone  15 mg Oral BID  . docusate sodium  100 mg Oral BID  . DULoxetine  60 mg Oral Daily  . gabapentin  100 mg Oral QHS  . lisinopril  5 mg Oral Daily  . pantoprazole  40 mg Oral Daily  . sodium chloride flush  3 mL Intravenous Q12H  . warfarin  2 mg Oral Once  . Warfarin - Pharmacist Dosing Inpatient   Does not apply q1800   Continuous Infusions: . sodium chloride 70 mL/hr at 11/28/16 1213  . piperacillin-tazobactam (ZOSYN)  IV 3.375 g (11/28/16 1247)  . vancomycin       LOS: 1 day    Time spent: 63 minutes    Rexene Alberts, MD [facility] Pager [phone]  If 7PM-7AM, please contact night-coverage [email or web address] Password [email or web address] 11/28/2016, 1:28 PM

## 2016-11-29 ENCOUNTER — Inpatient Hospital Stay (HOSPITAL_COMMUNITY): Payer: Medicare Other

## 2016-11-29 LAB — CBC
HEMATOCRIT: 23.4 % — AB (ref 36.0–46.0)
HEMOGLOBIN: 7.8 g/dL — AB (ref 12.0–15.0)
MCH: 29.4 pg (ref 26.0–34.0)
MCHC: 33.3 g/dL (ref 30.0–36.0)
MCV: 88.3 fL (ref 78.0–100.0)
Platelets: 282 10*3/uL (ref 150–400)
RBC: 2.65 MIL/uL — ABNORMAL LOW (ref 3.87–5.11)
RDW: 16.2 % — ABNORMAL HIGH (ref 11.5–15.5)
WBC: 9.4 10*3/uL (ref 4.0–10.5)

## 2016-11-29 LAB — ECHOCARDIOGRAM COMPLETE
CHL CUP STROKE VOLUME: 42 mL
E decel time: 215 msec
E/e' ratio: 7.78
FS: 34 % (ref 28–44)
Height: 62 in
IVS/LV PW RATIO, ED: 1.06
LA ID, A-P, ES: 42 mm
LA diam end sys: 42 mm
LA vol index: 31.3 mL/m2
LADIAMINDEX: 2.63 cm/m2
LAVOL: 50.1 mL
LAVOLA4C: 43.4 mL
LV E/e'average: 7.78
LV TDI E'LATERAL: 10.2
LV dias vol: 60 mL (ref 46–106)
LV sys vol index: 11 mL/m2
LV sys vol: 17 mL (ref 14–42)
LVDIAVOLIN: 37 mL/m2
LVEEMED: 7.78
LVELAT: 10.2 cm/s
LVOT VTI: 26.5 cm
LVOT area: 2.27 cm2
LVOT diameter: 17 mm
LVOT peak grad rest: 9 mmHg
LVOT peak vel: 153 cm/s
LVOTSV: 60 mL
Lateral S' vel: 10.8 cm/s
MV Dec: 215
MV pk A vel: 117 m/s
MVPG: 3 mmHg
MVPKEVEL: 79.4 m/s
PW: 11.1 mm — AB (ref 0.6–1.1)
RV sys press: 35 mmHg
Reg peak vel: 284 cm/s
Simpson's disk: 71
TAPSE: 20.7 mm
TDI e' medial: 7.62
TRMAXVEL: 284 cm/s
Weight: 2109.36 oz

## 2016-11-29 LAB — URINE CULTURE: Culture: NO GROWTH

## 2016-11-29 LAB — BASIC METABOLIC PANEL
Anion gap: 6 (ref 5–15)
BUN: 6 mg/dL (ref 6–20)
CHLORIDE: 105 mmol/L (ref 101–111)
CO2: 25 mmol/L (ref 22–32)
CREATININE: 0.69 mg/dL (ref 0.44–1.00)
Calcium: 8.2 mg/dL — ABNORMAL LOW (ref 8.9–10.3)
GFR calc Af Amer: >60 mL/min (ref >60)
GFR calc non Af Amer: >60 mL/min (ref >60)
Glucose, Bld: 101 mg/dL — ABNORMAL HIGH (ref 65–99)
Potassium: 4.1 mmol/L (ref 3.5–5.1)
Sodium: 136 mmol/L (ref 135–145)

## 2016-11-29 LAB — IRON AND TIBC
Iron: 15 ug/dL — ABNORMAL LOW (ref 28–170)
SATURATION RATIOS: 9 % — AB (ref 10.4–31.8)
TIBC: 165 ug/dL — ABNORMAL LOW (ref 250–450)
UIBC: 150 ug/dL

## 2016-11-29 LAB — PROTIME-INR
INR: 3.32
PROTHROMBIN TIME: 34.5 s — AB (ref 11.4–15.2)

## 2016-11-29 LAB — RETICULOCYTES
RBC.: 2.74 MIL/uL — ABNORMAL LOW (ref 3.87–5.11)
Retic Count, Absolute: 54.8 10*3/uL (ref 19.0–186.0)
Retic Ct Pct: 2 % (ref 0.4–3.1)

## 2016-11-29 LAB — FOLATE: Folate: 11 ng/mL (ref >5.9)

## 2016-11-29 LAB — VITAMIN B12: Vitamin B-12: 558 pg/mL (ref 180–914)

## 2016-11-29 LAB — FERRITIN: Ferritin: 1164 ng/mL — ABNORMAL HIGH (ref 11–307)

## 2016-11-29 MED ORDER — POLYETHYLENE GLYCOL 3350 17 G PO PACK
17.0000 g | PACK | Freq: Two times a day (BID) | ORAL | Status: DC
  Administered 2016-11-29 – 2016-12-05 (×12): 17 g via ORAL
  Filled 2016-11-29 (×15): qty 1

## 2016-11-29 MED ORDER — POTASSIUM CHLORIDE IN NACL 20-0.9 MEQ/L-% IV SOLN: INTRAVENOUS | Status: DC

## 2016-11-29 MED ORDER — CYANOCOBALAMIN 1000 MCG/ML IJ SOLN
1000.0000 ug | Freq: Every day | INTRAMUSCULAR | Status: DC
  Administered 2016-11-29 – 2016-12-06 (×8): 1000 ug via INTRAMUSCULAR
  Filled 2016-11-29 (×8): qty 1

## 2016-11-29 MED ORDER — TRAMADOL HCL 50 MG PO TABS
100.0000 mg | ORAL_TABLET | Freq: Four times a day (QID) | ORAL | Status: DC | PRN
  Administered 2016-11-29 – 2016-11-30 (×3): 100 mg via ORAL
  Filled 2016-11-29 (×5): qty 2

## 2016-11-29 NOTE — Progress Notes (Signed)
ANTICOAGULATION CONSULT NOTE   Pharmacy Consult for Coumadin Indication: atrial fibrillation  No Known Allergies  Patient Measurements: Height: 5\' 2"  (157.5 cm) Weight: 131 lb 13.4 oz (59.8 kg) IBW/kg (Calculated) : 50.1  Vital Signs: Temp: 100.3 F (37.9 C) (07/30 0559) Temp Source: Oral (07/30 0559) BP: 140/61 (07/30 0553) Pulse Rate: 103 (07/30 0553)  Labs:  Recent Labs  11/27/16 1215 11/27/16 1332 11/27/16 1545 11/27/16 2008 11/28/16 0711 11/29/16 0744  HGB 9.8*  --  9.5*  --  7.8* 7.8*  HCT 28.8*  --  27.9*  --  22.9* 23.4*  PLT 266  --  272  --  258 282  APTT  --   --   --  89*  --   --   LABPROT  --   --   --  32.0* 30.3* 34.5*  INR  --   --   --  3.03 2.83 3.32  CREATININE 0.91  --   --   --  0.85 0.69  TROPONINI  --  <0.03  --   --   --   --    Estimated Creatinine Clearance: 44.4 mL/min (by C-G formula based on SCr of 0.69 mg/dL).  Medical History: Past Medical History:  Diagnosis Date  . Alcohol abuse, in remission   . Anxiety   . Atrial fibrillation ([place])   . Depression   . Dyspnea    walking distances   . Dysrhythmia   . History of bronchitis   . HTN (hypertension)   . Mass of right lung 07/13/2016  . Osteopenia   . Squamous cell carcinoma of right lung ([place]) 07/13/2016   Medications:  Prescriptions Prior to Admission  Medication Sig Dispense Refill Last Dose  . amiodarone (PACERONE) 200 MG tablet Take 200 mg by mouth daily.    11/26/2016 at Unknown time  . aspirin EC 81 MG tablet Take 81 mg by mouth daily.   11/26/2016 at Unknown time  . atorvastatin (LIPITOR) 40 MG tablet TAKE 1 TABLET (40 MG TOTAL) BY MOUTH DAILY. FOR HYPERLIPIDEMIA 90 tablet 1 11/26/2016 at Unknown time  . busPIRone (BUSPAR) 15 MG tablet Take 1 tablet (15 mg total) by mouth 2 (two) times daily. 180 tablet 2 11/26/2016 at Unknown time  . Calcium Carb-Cholecalciferol (CALCIUM 500+D3) 500-400 MG-UNIT TABS Take 1 tablet by mouth 2 (two) times daily.   11/26/2016 at Unknown time  .  docusate calcium (SURFAK) 240 MG capsule Take 1 capsule (240 mg total) by mouth daily as needed for mild constipation. 30 capsule 1 11/26/2016 at Unknown time  . DULoxetine (CYMBALTA) 60 MG capsule Take 1 capsule (60 mg total) by mouth daily. 30 capsule 2 11/26/2016 at Unknown time  . fish oil-omega-3 fatty acids 1000 MG capsule Take 3 capsules (3 g total) by mouth daily. For hyperlipidemia. (Patient taking differently: Take 1 g by mouth 3 (three) times daily. For hyperlipidemia.) 30 capsule  11/26/2016 at Unknown time  . fluticasone (FLONASE) 50 MCG/ACT nasal spray Place 2 sprays into both nostrils 2 (two) times daily. Use as needed (Patient taking differently: Place 2 sprays into both nostrils 2 (two) times daily as needed for allergies. Use as needed) 16 g 6 11/26/2016 at Unknown time  . gabapentin (NEURONTIN) 100 MG capsule Take 1 capsule (100 mg total) by mouth at bedtime. 90 capsule 2 11/26/2016 at Unknown time  . lisinopril (PRINIVIL,ZESTRIL) 5 MG tablet Take 5 mg by mouth.   11/26/2016 at Unknown time  . meclizine (ANTIVERT) 25  MG tablet Take 25 mg by mouth 3 (three) times daily as needed for dizziness.   11/26/2016 at Unknown time  . nitroGLYCERIN (NITROSTAT) 0.4 MG SL tablet Place 0.4 mg under the tongue.   11/26/2016 at Unknown time  . omeprazole (PRILOSEC) 40 MG capsule Take 1 capsule (40 mg total) by mouth daily. 30 capsule 2 11/26/2016 at Unknown time  . warfarin (COUMADIN) 2.5 MG tablet Take 2.5 mg by mouth daily.    11/26/2016 at 0800   Assessment: 82 y.o. female with medical history significant of recent diagnosis of atrial fibrillation, on chronic anticoagulation. Lung cancer status post radiation and chemotherapy with admission 7/21-22 for probably vasovagal syncope. Patient reported feeling weak, light-headed, and dizzy with extreme fatigue. Admitted with sepsis. Pharmacy consulted to manage coumadin INR > 3 today.   Goal of Therapy:  INR 2-3 Monitor platelets by anticoagulation protocol:  Yes   Plan:  HOLD coumadin today, allow INR to trend down PT-INR daily Monitor for S/S of bleeding  Hart Robinsons, PharmD Clinical Pharmacist Pager:  [phone] 11/29/2016   11/29/2016,11:17 AM

## 2016-11-29 NOTE — Progress Notes (Addendum)
   11/29/16 1454  Vitals  Temp (!) 102.2 F (39 C) (RN notified )  Temp Source Oral  BP (!) 143/74  BP Location Right Arm  BP Method Automatic  Patient Position (if appropriate) Lying  Pulse Rate (!) 121  Pulse Rate Source Dinamap  Resp (!) 21   1515- PRN Tylenol given and Paged Dr Aileen Fass via AMION to notify of temp.  No new orders at this time.  1635- Recheck of temp was 99.1

## 2016-11-29 NOTE — Care Management Note (Signed)
Case Management Note  Patient Details  Name: Alexandria Chen MRN: [id number] Date of Birth: 08/29/1934  Subjective/Objective: Adm with syncope. From home with family. Walks with cane at times. Recent admission for same, work up pending. PT eval pending.                   Action/Plan: CM following. Anticipate DC home with HH.   Expected Discharge Date:     11/30/2016             Expected Discharge Plan:  [place]  In-House Referral:     Discharge planning Services  CM Consult  Post Acute Care Choice:    Choice offered to:     DME Arranged:    DME Agency:     HH Arranged:    HH Agency:     Status of Service:  In process, will continue to follow  If discussed at Long Length of Stay Meetings, dates discussed:    Additional Comments:  [name], Chauncey Reading, RN 11/29/2016, 1:55 PM

## 2016-11-29 NOTE — Progress Notes (Addendum)
[facility] PROGRESS NOTE    Progress Note  Alexandria Chen  UKG:[id number] DOB: 12/04/1934 DOA: 11/27/2016 PCP: [name], FNP     Brief Narrative:   Alexandria Chen is an 82 y.o. female past medical history significant for chronic atrial fibrillation on Coumadin, lung cancer status post radiation chemotherapy, discharge from the hospital on 11/21/2016 for vasovagal syncope. Since then she's been falling at home, she relates she is lightheaded. She denies any loss of consciousness. No head trauma. Assessment and ED with a temperature of 101  Assessment/Plan:   Orthostatic hypotension: No changes in her medications. She was started on fluid hydration. Orthostatics were repeated and they are positive. Recheck in the morning. We'll continue fluid hydration. Repeat orthostatics in the morning.  Fever of unknown source: She was started empirically on IV vancomycin and Zosyn. Blood cultures and urine cultures are negative till date.  Essential hypertension, benign Continuo Lisinopril.  Paroxysmal atrial fibrillation: Rate controlled INR subtherapeutic. Continue Coumadin per pharmacy and amiodarone.  Normocytic anemia: Questionable hemodilution of from 9.5-7.8. Check FOBT start her on MiraLAX.  Vitamin B-12 deficiency: Start vitamin B-12 intramuscularly.  Squamous carcinoma of the lung: CT of the chest reveal Carrie 2 lesions in the posterior medial right lower lobe. Only to follow-up with oncology as an outpatient.    DVT prophylaxis: coumadin Family Communication:daughter Disposition Plan/Barrier to D/C: daughter Code Status:     Code Status Orders        Start     Ordered   11/27/16 1808  Full code  Continuous     11/27/16 1807    Code Status History    Date Active Date Inactive Code Status Order ID Comments User Context   11/20/2016  4:13 PM 11/21/2016  5:29 PM Full Code [id number]  Arrien, Jimmy Picket, MD ED    Advance Directive Documentation    Most Recent Value  Type of Advance Directive  Healthcare Power of Attorney  Pre-existing out of facility DNR order (yellow form or pink MOST form)  -  "MOST" Form in Place?  -        IV Access:    Peripheral IV   Procedures and diagnostic studies:   Ct Head Wo Contrast  Result Date: 11/27/2016 CLINICAL DATA:  Patient status post fall.  Dizziness. EXAM: CT HEAD WITHOUT CONTRAST TECHNIQUE: Contiguous axial images were obtained from the base of the skull through the vertex without intravenous contrast. COMPARISON:  Brain CT 11/20/2016. FINDINGS: Brain: Periventricular and subcortical white matter hypodensity compatible with chronic microvascular ischemic changes. No evidence for acute cortically based infarct, intracranial hemorrhage, mass lesion or mass-effect. Old left basal ganglia lacunar infarct. Vascular: No hyperdense vessel or unexpected calcification. Skull: Intact. Sinuses/Orbits: Paranasal sinuses are well aerated. Mastoid air cells unremarkable. Other: None. IMPRESSION: No acute intracranial process. Atrophy and chronic microvascular ischemic changes. Electronically Signed   By: Lovey Newcomer M.D.   On: 11/27/2016 15:09   Ct Angio Chest Pe W And/or Wo Contrast  Result Date: 11/27/2016 CLINICAL DATA:  Fall.  Chest pain.  Evaluate for pulmonary embolus. EXAM: CT ANGIOGRAPHY CHEST WITH CONTRAST TECHNIQUE: Multidetector CT imaging of the chest was performed using the standard protocol during bolus administration of intravenous contrast. Multiplanar CT image reconstructions and MIPs were obtained to evaluate the vascular anatomy. CONTRAST:  70 cc of Isovue 370 COMPARISON:  PET-CT from 07/07/2016 FINDINGS: Cardiovascular: Normal heart size. Aortic atherosclerosis. Calcifications within the LAD, RCA and left circumflex coronary artery noted. No pericardial  effusion. The main pulmonary artery is patent. No saddle embolus. Beyond the level of the main pulmonary artery is exam detail is  diminished secondary to motion artifact. No lobar pulmonary artery filling defects identified. The upper lobe segmental pulmonary arteries appear patent. Diminished detail within the lower lobe pulmonary arteries due to motion artifact. Mediastinum/Nodes: The trachea appears patent and is midline. Normal appearance of the esophagus. Index right paratracheal node measures 6 mm, image 26 of series 4. Previously this lymph node measured 1.4 cm. Index right hilar node measures 1 cm, image 31 of series 4. Previously 1.3 cm. New left paratracheal lymph node measures 1 cm, image 25 of series 4. Lungs/Pleura: No pleural effusion. Right upper lobe paramediastinal mass measures 2.8 by 2.1 cm, image 25 of series 6. Previous 4.9 x 3.3 cm. There is a new area of masslike consolidation within the posteromedial right lower lobe measuring 2.4 x 3.8 cm, image 148 of series 5. Central area of cavitation is identified, image 48 of series 6. Masslike area of architectural distortion within the left upper lobe is new from previous exam, image number 21 of series 6. Upper Abdomen: No change in small nodule involving the right adrenal gland measuring 1 cm, image 77 of series 4. Area of low attenuation within the lateral aspect of the right lobe of liver is unchanged from 07/07/2016 measuring 3.2 cm. Musculoskeletal: There are no aggressive lytic or sclerotic bone lesions identified. Degenerative disc disease is noted throughout the thoracic spine. Review of the MIP images confirms the above findings. IMPRESSION: 1. Exam detail is diminished due to respiratory motion artifact. Within this limitation no evidence for clinically significant acute pulmonary embolus identified. 2. The original index lesion within the paramediastinal right upper lobe is decreased in size from 07/07/2016. 3. There is a new partially cavitary lesion within the posteromedial right lower lobe which is indeterminate. Cannot rule out progression of disease. Recommend  further evaluation with PET-CT. 4. Masslike area of architectural distortion in the left upper lobe is nonspecific but may reflect changes secondary to external beam radiation. Underlying tumor involvement cannot be excluded. 5. Decrease in size of right paratracheal lymph node with increase in size of left paratracheal node. 6. Aortic Atherosclerosis (ICD10-I70.0). Multi vessel coronary artery calcification noted. Electronically Signed   By: Kerby Moors M.D.   On: 11/27/2016 15:15     Medical Consultants:    None.  Anti-Infectives:   IV vancomycin and Zosyn  Subjective:    Leticia Penna she relates she has a headache.  Objective:    Vitals:   11/28/16 2143 11/28/16 2146 11/29/16 0553 11/29/16 0559  BP:  (!) 114/57 140/61   Pulse:  93 (!) 103   Resp:  18 18   Temp:  99.3 F (37.4 C) 100.3 F (37.9 C) 100.3 F (37.9 C)  TempSrc:  Oral Oral Oral  SpO2: (!) 89% 100% 100%   Weight:      Height:        Intake/Output Summary (Last 24 hours) at 11/29/16 0740 Last data filed at 11/29/16 6599  Gross per 24 hour  Intake          2579.17 ml  Output             2650 ml  Net           -70.83 ml   Filed Weights   11/27/16 1146 11/27/16 1845  Weight: 58.5 kg (129 lb) 59.8 kg (131 lb 13.4 oz)  Exam: General exam: In no acute distress. Respiratory system: Good air movement clear to auscultation. Cardiovascular system: Regular rate and rhythm with positive S1-S2 positive JVD. Gastrointestinal system: Positive bowel sounds soft nontender nondistended. Central nervous system: Awake alert and oriented 3. Extremities: No lower extremity edema. Skin: No rashes or ulcerations. Psychiatry: Judgment and insight appeared normal.   Data Reviewed:    Labs: Basic Metabolic Panel:  Recent Labs Lab 11/27/16 1215 11/28/16 0711  NA 133* 138  K 4.0 3.4*  CL 95* 106  CO2 26 24  GLUCOSE 109* 114*  BUN 13 9  CREATININE 0.91 0.85  CALCIUM 9.0 8.3*   GFR Estimated  Creatinine Clearance: 41.8 mL/min (by C-G formula based on SCr of 0.85 mg/dL). Liver Function Tests: No results for input(s): AST, ALT, ALKPHOS, BILITOT, PROT, ALBUMIN in the last 168 hours. No results for input(s): LIPASE, AMYLASE in the last 168 hours. No results for input(s): AMMONIA in the last 168 hours. Coagulation profile  Recent Labs Lab 11/27/16 2008 11/28/16 0711  INR 3.03 2.83    CBC:  Recent Labs Lab 11/26/16 1231 11/27/16 1215 11/27/16 1545 11/28/16 0711  WBC 4.8 7.0 8.0 8.5  NEUTROABS 2.6  --  5.6  --   HGB 9.7* 9.8* 9.5* 7.8*  HCT 28.1* 28.8* 27.9* 22.9*  MCV 84 87.0 87.5 87.4  PLT 293 266 272 258   Cardiac Enzymes:  Recent Labs Lab 11/27/16 1332  TROPONINI <0.03   BNP (last 3 results) No results for input(s): PROBNP in the last 8760 hours. CBG:  Recent Labs Lab 11/27/16 1156 11/27/16 1210  GLUCAP 77 78   D-Dimer: No results for input(s): DDIMER in the last 72 hours. Hgb A1c: No results for input(s): HGBA1C in the last 72 hours. Lipid Profile: No results for input(s): CHOL, HDL, LDLCALC, TRIG, CHOLHDL, LDLDIRECT in the last 72 hours. Thyroid function studies:  Recent Labs  11/27/16 2008  TSH 0.327*   Anemia work up:  Recent Labs  11/27/16 2008  VITAMINB12 293   Sepsis Labs:  Recent Labs Lab 11/26/16 1231 11/27/16 1215 11/27/16 1332 11/27/16 1545 11/27/16 2008 11/28/16 0711  PROCALCITON  --   --   --   --  0.36  --   WBC 4.8 7.0  --  8.0  --  8.5  LATICACIDVEN  --   --  1.8 1.5  --   --    Microbiology Recent Results (from the past 240 hour(s))  Blood Culture (routine x 2)     Status: None (Preliminary result)   Collection Time: 11/27/16  3:46 PM  Result Value Ref Range Status   Specimen Description BLOOD  Final   Special Requests NONE  Final   Culture NO GROWTH 2 DAYS  Final   Report Status PENDING  Incomplete  Blood Culture (routine x 2)     Status: None (Preliminary result)   Collection Time: 11/27/16  3:48 PM    Result Value Ref Range Status   Specimen Description BLOOD  Final   Special Requests NONE  Final   Culture NO GROWTH 2 DAYS  Final   Report Status PENDING  Incomplete     Medications:   . amiodarone  200 mg Oral Daily  . aspirin EC  81 mg Oral Daily  . atorvastatin  40 mg Oral q1800  . busPIRone  15 mg Oral BID  . docusate sodium  100 mg Oral BID  . DULoxetine  60 mg Oral Daily  . gabapentin  100 mg Oral QHS  . lisinopril  2.5 mg Oral Daily  . pantoprazole  40 mg Oral Daily  . potassium chloride  20 mEq Oral BID  . sodium chloride flush  3 mL Intravenous Q12H  . Warfarin - Pharmacist Dosing Inpatient   Does not apply q1800   Continuous Infusions: . sodium chloride    . 0.9 % NaCl with KCl 20 mEq / L 100 mL/hr at 11/29/16 0234  . piperacillin-tazobactam (ZOSYN)  IV 3.375 g (11/29/16 0556)  . vancomycin Stopped (11/28/16 1607)     LOS: 2 days   Charlynne Cousins  [facility] Pager [phone]  *Please refer to [email or web address], password [email or web address] to get updated schedule on who will round on this patient, as hospitalists switch teams weekly. If 7PM-7AM, please contact night-coverage at [email or web address], password [email or web address] for any overnight needs.  11/29/2016, 7:40 AM

## 2016-11-29 NOTE — Progress Notes (Signed)
PT Cancellation Note  Patient Details Name: MAYTTE JACOT MRN: [id number] DOB: [birth year]   Cancelled Treatment:    Reason Eval/Treat Not Completed: Patient not medically ready. Per attending's note: pt remains orthostaticly hypotensive as of this morning with orders to recheck tomorrow morning. Most recent vitals reveal continued fever (102+) and tachycardia 120s. Will hold PT evaluation at this time and until patient is medically ready to participate in exertional activity.   3:39 PM, 11/29/16 Etta Grandchild, PT, DPT Physical Therapist - [place] [phone] (Office)     Buccola,Allan C 11/29/2016, 3:38 PM

## 2016-11-30 DIAGNOSIS — R55 Syncope and collapse: Secondary | ICD-10-CM

## 2016-11-30 DIAGNOSIS — I10 Essential (primary) hypertension: Secondary | ICD-10-CM

## 2016-11-30 DIAGNOSIS — R509 Fever, unspecified: Secondary | ICD-10-CM

## 2016-11-30 LAB — BASIC METABOLIC PANEL
Anion gap: 8 (ref 5–15)
BUN: 9 mg/dL (ref 6–20)
CALCIUM: 8.6 mg/dL — AB (ref 8.9–10.3)
CHLORIDE: 98 mmol/L — AB (ref 101–111)
CO2: 25 mmol/L (ref 22–32)
CREATININE: 0.78 mg/dL (ref 0.44–1.00)
GFR calc non Af Amer: >60 mL/min (ref >60)
Glucose, Bld: 112 mg/dL — ABNORMAL HIGH (ref 65–99)
Potassium: 3.9 mmol/L (ref 3.5–5.1)
SODIUM: 131 mmol/L — AB (ref 135–145)

## 2016-11-30 LAB — CBC
HCT: 23 % — ABNORMAL LOW (ref 36.0–46.0)
Hemoglobin: 8 g/dL — ABNORMAL LOW (ref 12.0–15.0)
MCH: 30.1 pg (ref 26.0–34.0)
MCHC: 34.8 g/dL (ref 30.0–36.0)
MCV: 86.5 fL (ref 78.0–100.0)
Platelets: 304 10*3/uL (ref 150–400)
RBC: 2.66 MIL/uL — ABNORMAL LOW (ref 3.87–5.11)
RDW: 16.1 % — AB (ref 11.5–15.5)
WBC: 7.2 10*3/uL (ref 4.0–10.5)

## 2016-11-30 LAB — PROTIME-INR
INR: 3.5
PROTHROMBIN TIME: 36 s — AB (ref 11.4–15.2)

## 2016-11-30 MED ORDER — SODIUM CHLORIDE 0.9 % IV SOLN
INTRAVENOUS | Status: DC
  Administered 2016-11-30: 15:00:00 via INTRAVENOUS

## 2016-11-30 NOTE — Progress Notes (Addendum)
   11/30/16 1045  Orthostatic Lying   BP- Lying 111/74  Pulse- Lying 90  Orthostatic Sitting  BP- Sitting 114/64  Pulse- Sitting 94  Orthostatic Standing at 0 minutes  BP- Standing at 0 minutes (!) 161/134  Pulse- Standing at 0 minutes 98  Orthostatic Standing at 3 minutes  BP- Standing at 3 minutes 92/66  Pulse- Standing at 3 minutes 104  Oxygen Therapy  SpO2 99 %  O2 Device Nasal Cannula  O2 Flow Rate (L/min) 1.5 L/min   PT evaluation with continued orthostatic vitals. Pt does tolerate standing at bedside x3 minutes without prodome, but upon attempted  amb, reports feeling 'woozy,' stumbling somewhat and asks to sit. See full note for details.   10:56 AM, 11/30/16 Etta Grandchild, PT, DPT Physical Therapist - [place] [phone] (Office)

## 2016-11-30 NOTE — Progress Notes (Signed)
PROGRESS NOTE    Alexandria Chen  TTS:[id number] DOB: [birth year] DOA: 11/27/2016 PCP: [name], FNP    Brief Narrative: Alexandria Chen is an 82 y.o. female past medical history significant for chronic atrial fibrillation on Coumadin, lung cancer status post radiation chemotherapy, discharge from the hospital on 11/21/2016 for vasovagal syncope. Since then she's been falling at home, she relates she is lightheaded. She denies any loss of consciousness. No head trauma. Assessment and ED with a temperature of 101.  She was Tx for sepsis of unclear source.  BC have remained negative.  She was also found to have low BP and has been on low dose ACE I.  It should be noted that her CT of the chest reviewed a new partially cavitary lesion within the posteromedial RLL (intermediate--cannot r/out disease progression.      Assessment & Plan:   Principal Problem:   Syncope and collapse Active Problems:   Essential hypertension, benign   Squamous cell carcinoma of right lung (HCC)   Atrial fibrillation (HCC) [I48.91]   Sepsis (HCC)   Anemia, unspecified   Warfarin anticoagulation   1/  Possible sepsis:  Continue with IV broad spectrum antibiotics.  She is now afebrile.    2/  Syncope:  Vasovagal possible.  But her BP was too low and it is possible she is over Tx with BP meds.  Will d/c Lisinopril.  Give IVF.   3/   Lung CA:  She will follow up with oncology.  4/  Afib: Will continue with coumadin per pharmacy dosing.    DVT prophylaxis: Coumadin.  Code Status: FULL CODE.  Family Communication: daughter [name] at her bedside.  Disposition Plan: Home.   Consultants:   None.   Procedures:   None.   Antimicrobials: Anti-infectives    Start     Dose/Rate Route Frequency Ordered Stop   11/28/16 1600  vancomycin (VANCOCIN) IVPB 1000 mg/200 mL premix     1,000 mg 200 mL/hr over 60 Minutes Intravenous Every 24 hours 11/27/16 1906     11/27/16 2200  piperacillin-tazobactam (ZOSYN) IVPB 3.375 g      3.375 g 12.5 mL/hr over 240 Minutes Intravenous Every 8 hours 11/27/16 1906     11/27/16 1545  vancomycin (VANCOCIN) IVPB 1000 mg/200 mL premix     1,000 mg 200 mL/hr over 60 Minutes Intravenous  Once 11/27/16 1534 11/28/16 0518   11/27/16 1545  piperacillin-tazobactam (ZOSYN) IVPB 3.375 g     3.375 g 100 mL/hr over 30 Minutes Intravenous  Once 11/27/16 1534 11/28/16 0519       Subjective:  Occasional lightheadedness.   Objective: Vitals:   11/30/16 1015 11/30/16 1045 11/30/16 1201 11/30/16 1355  BP:    127/75  Pulse: 86   90  Resp:    16  Temp:    98.3 F (36.8 C)  TempSrc:    Oral  SpO2: (!) 86% 99% 93% 99%  Weight:      Height:        Intake/Output Summary (Last 24 hours) at 11/30/16 1747 Last data filed at 11/30/16 1711  Gross per 24 hour  Intake          1092.67 ml  Output             1350 ml  Net          -257.33 ml   Filed Weights   11/27/16 1146 11/27/16 1845  Weight: 58.5 kg (129 lb) 59.8 kg (131  lb 13.4 oz)    Examination:  General exam: Appears calm and comfortable  Respiratory system: Clear to auscultation. Respiratory effort normal. Cardiovascular system: S1 & S2 heard, RRR. No JVD, murmurs, rubs, gallops or clicks. No pedal edema. Gastrointestinal system: Abdomen is nondistended, soft and nontender. No organomegaly or masses felt. Normal bowel sounds heard. Central nervous system: Alert and oriented. No focal neurological deficits. Extremities: Symmetric 5 x 5 power. Skin: No rashes, lesions or ulcers Psychiatry: Judgement and insight appear normal. Mood & affect appropriate.   Data Reviewed: I have personally reviewed following labs and imaging studies  CBC:  Recent Labs Lab 11/26/16 1231 11/27/16 1215 11/27/16 1545 11/28/16 0711 11/29/16 0744 11/30/16 0620  WBC 4.8 7.0 8.0 8.5 9.4 7.2  NEUTROABS 2.6  --  5.6  --   --   --   HGB 9.7* 9.8* 9.5* 7.8* 7.8* 8.0*  HCT 28.1* 28.8* 27.9* 22.9* 23.4* 23.0*  MCV 84 87.0 87.5 87.4 88.3 86.5    PLT 293 266 272 258 282 758   Basic Metabolic Panel:  Recent Labs Lab 11/27/16 1215 11/28/16 0711 11/29/16 0744 11/30/16 0620  NA 133* 138 136 131*  K 4.0 3.4* 4.1 3.9  CL 95* 106 105 98*  CO2 26 24 25 25   GLUCOSE 109* 114* 101* 112*  BUN 13 9 6 9   CREATININE 0.91 0.85 0.69 0.78  CALCIUM 9.0 8.3* 8.2* 8.6*   GFR: Coagulation Profile:  Recent Labs Lab 11/27/16 2008 11/28/16 0711 11/29/16 0744 11/30/16 0620  INR 3.03 2.83 3.32 3.50   Cardiac Enzymes:  Recent Labs Lab 11/27/16 1332  TROPONINI <0.03   BNP (last 3 results) CBG:  Recent Labs Lab 11/27/16 1156 11/27/16 1210  GLUCAP 77 78   Thyroid Function Tests:  Recent Labs  11/27/16 2008  TSH 0.327*   Anemia Panel:  Recent Labs  11/27/16 2008 11/29/16 1042  VITAMINB12 293 558  FOLATE  --  11.0  FERRITIN  --  1,164*  TIBC  --  165*  IRON  --  15*  RETICCTPCT  --  2.0   Sepsis Labs:  Recent Labs Lab 11/27/16 1332 11/27/16 1545 11/27/16 2008  PROCALCITON  --   --  0.36  LATICACIDVEN 1.8 1.5  --     Recent Results (from the past 240 hour(s))  Urine Culture     Status: None   Collection Time: 11/27/16  3:35 PM  Result Value Ref Range Status   Specimen Description URINE, CLEAN CATCH  Final   Special Requests NONE  Final   Culture   Final    NO GROWTH Performed at [facility], [place] [address] [place], [place] [address]    Report Status 11/29/2016 FINAL  Final  Blood Culture (routine x 2)     Status: None (Preliminary result)   Collection Time: 11/27/16  3:46 PM  Result Value Ref Range Status   Specimen Description BLOOD  Final   Special Requests NONE  Final   Culture NO GROWTH 3 DAYS  Final   Report Status PENDING  Incomplete  Blood Culture (routine x 2)     Status: None (Preliminary result)   Collection Time: 11/27/16  3:48 PM  Result Value Ref Range Status   Specimen Description BLOOD  Final   Special Requests NONE  Final   Culture NO GROWTH 3 DAYS  Final   Report  Status PENDING  Incomplete     Radiology Studies: US Carotid Bilateral  Result Date: 11/29/2016  CLINICAL DATA:  Syncope.  Vertigo.  History of lung cancer. EXAM: BILATERAL CAROTID DUPLEX ULTRASOUND TECHNIQUE: Pearline Cables scale imaging, color Doppler and duplex ultrasound were performed of bilateral carotid and vertebral arteries in the neck. COMPARISON:  06/05/2009 FINDINGS: Criteria: Quantification of carotid stenosis is based on velocity parameters that correlate the residual internal carotid diameter with NASCET-based stenosis levels, using the diameter of the distal internal carotid lumen as the denominator for stenosis measurement. The following velocity measurements were obtained: RIGHT ICA:  113 cm/sec CCA:  59 cm/sec SYSTOLIC ICA/CCA RATIO:  1.9 DIASTOLIC ICA/CCA RATIO:  1.8 ECA:  64 cm/sec LEFT ICA:  82 cm/sec CCA:  56 cm/sec SYSTOLIC ICA/CCA RATIO:  1.5 DIASTOLIC ICA/CCA RATIO:  2.2 ECA:  72 cm/sec RIGHT CAROTID ARTERY: There is prominent calcified plaque in the bulb. Low resistance internal carotid Doppler pattern is preserved. RIGHT VERTEBRAL ARTERY:  Antegrade. LEFT CAROTID ARTERY: Moderate irregular plaque with some calcification in the bulb. Low resistance internal carotid Doppler pattern. LEFT VERTEBRAL ARTERY:  Antegrade. IMPRESSION: There is less than 50% stenosis in the right and left internal carotid arteries. There is plaque in both bulbs right greater than left. Electronically Signed   By: Marybelle Killings M.D.   On: 11/29/2016 10:10    Scheduled Meds: . amiodarone  200 mg Oral Daily  . aspirin EC  81 mg Oral Daily  . atorvastatin  40 mg Oral q1800  . busPIRone  15 mg Oral BID  . cyanocobalamin  1,000 mcg Intramuscular Daily  . docusate sodium  100 mg Oral BID  . DULoxetine  60 mg Oral Daily  . gabapentin  100 mg Oral QHS  . pantoprazole  40 mg Oral Daily  . polyethylene glycol  17 g Oral BID  . sodium chloride flush  3 mL Intravenous Q12H  . Warfarin - Pharmacist Dosing Inpatient    Does not apply q1800   Continuous Infusions: . sodium chloride    . sodium chloride 50 mL/hr at 11/30/16 1510  . piperacillin-tazobactam (ZOSYN)  IV 3.375 g (11/30/16 1510)  . vancomycin Stopped (11/30/16 1711)    LOS: 3 days   Cashis Rill, MD FACP Hospitalist.   If 7PM-7AM, please contact night-coverage [email or web address] Password [email or web address] 11/30/2016, 5:47 PM

## 2016-11-30 NOTE — Progress Notes (Signed)
ANTICOAGULATION CONSULT NOTE   Pharmacy Consult for Coumadin Indication: atrial fibrillation  No Known Allergies  Patient Measurements: Height: 5\' 2"  (157.5 cm) Weight: 131 lb 13.4 oz (59.8 kg) IBW/kg (Calculated) : 50.1  Vital Signs: Temp: 99.1 F (37.3 C) (07/31 0623) Temp Source: Oral (07/31 0623) BP: 115/63 (07/31 0623) Pulse Rate: 96 (07/31 0623)  Labs:  Recent Labs  11/27/16 1332  11/27/16 2008 11/28/16 0711 11/29/16 0744 11/30/16 0620  HGB  --   < >  --  7.8* 7.8* 8.0*  HCT  --   < >  --  22.9* 23.4* 23.0*  PLT  --   < >  --  258 282 304  APTT  --   --  89*  --   --   --   LABPROT  --   < > 32.0* 30.3* 34.5* 36.0*  INR  --   < > 3.03 2.83 3.32 3.50  CREATININE  --   --   --  0.85 0.69 0.78  TROPONINI <0.03  --   --   --   --   --   < > = values in this interval not displayed. Estimated Creatinine Clearance: 44.4 mL/min (by C-G formula based on SCr of 0.78 mg/dL).  Medical History: Past Medical History:  Diagnosis Date  . Alcohol abuse, in remission   . Anxiety   . Atrial fibrillation ([place])   . Depression   . Dyspnea    walking distances   . Dysrhythmia   . History of bronchitis   . HTN (hypertension)   . Mass of right lung 07/13/2016  . Osteopenia   . Squamous cell carcinoma of right lung ([place]) 07/13/2016   Medications:  Prescriptions Prior to Admission  Medication Sig Dispense Refill Last Dose  . amiodarone (PACERONE) 200 MG tablet Take 200 mg by mouth daily.    11/26/2016 at Unknown time  . aspirin EC 81 MG tablet Take 81 mg by mouth daily.   11/26/2016 at Unknown time  . atorvastatin (LIPITOR) 40 MG tablet TAKE 1 TABLET (40 MG TOTAL) BY MOUTH DAILY. FOR HYPERLIPIDEMIA 90 tablet 1 11/26/2016 at Unknown time  . busPIRone (BUSPAR) 15 MG tablet Take 1 tablet (15 mg total) by mouth 2 (two) times daily. 180 tablet 2 11/26/2016 at Unknown time  . Calcium Carb-Cholecalciferol (CALCIUM 500+D3) 500-400 MG-UNIT TABS Take 1 tablet by mouth 2 (two) times daily.    11/26/2016 at Unknown time  . docusate calcium (SURFAK) 240 MG capsule Take 1 capsule (240 mg total) by mouth daily as needed for mild constipation. 30 capsule 1 11/26/2016 at Unknown time  . DULoxetine (CYMBALTA) 60 MG capsule Take 1 capsule (60 mg total) by mouth daily. 30 capsule 2 11/26/2016 at Unknown time  . fish oil-omega-3 fatty acids 1000 MG capsule Take 3 capsules (3 g total) by mouth daily. For hyperlipidemia. (Patient taking differently: Take 1 g by mouth 3 (three) times daily. For hyperlipidemia.) 30 capsule  11/26/2016 at Unknown time  . fluticasone (FLONASE) 50 MCG/ACT nasal spray Place 2 sprays into both nostrils 2 (two) times daily. Use as needed (Patient taking differently: Place 2 sprays into both nostrils 2 (two) times daily as needed for allergies. Use as needed) 16 g 6 11/26/2016 at Unknown time  . gabapentin (NEURONTIN) 100 MG capsule Take 1 capsule (100 mg total) by mouth at bedtime. 90 capsule 2 11/26/2016 at Unknown time  . lisinopril (PRINIVIL,ZESTRIL) 5 MG tablet Take 5 mg by mouth.  11/26/2016 at Unknown time  . meclizine (ANTIVERT) 25 MG tablet Take 25 mg by mouth 3 (three) times daily as needed for dizziness.   11/26/2016 at Unknown time  . nitroGLYCERIN (NITROSTAT) 0.4 MG SL tablet Place 0.4 mg under the tongue.   11/26/2016 at Unknown time  . omeprazole (PRILOSEC) 40 MG capsule Take 1 capsule (40 mg total) by mouth daily. 30 capsule 2 11/26/2016 at Unknown time  . warfarin (COUMADIN) 2.5 MG tablet Take 2.5 mg by mouth daily.    11/26/2016 at 0800   Assessment: 82 y.o. female with medical history significant of recent diagnosis of atrial fibrillation, on chronic anticoagulation. Lung cancer status post radiation and chemotherapy with admission 7/21-22 for probably vasovagal syncope. Patient reported feeling weak, light-headed, and dizzy with extreme fatigue. Admitted with sepsis. Pharmacy consulted to manage coumadin INR > 3 today.   Goal of Therapy:  INR 2-3 Monitor platelets  by anticoagulation protocol: Yes   Plan:  HOLD coumadin today, allow INR to trend down PT-INR daily Monitor for S/S of bleeding  Hart Robinsons, PharmD Clinical Pharmacist Pager:  [phone] 11/30/2016   11/30/2016,11:09 AM

## 2016-11-30 NOTE — Progress Notes (Signed)
VS assessed with patient lying and sitting on edge of bed. Patient did not tolerate standing to assess BP. Per tele, HR increased to 150s when attempting to stand. Patient now diaphoretic. VS per flow sheet. Temperature did decrease from 100.3 to 99.1. No distress noted at this time. Will continue to monitor.

## 2016-11-30 NOTE — Evaluation (Signed)
Physical Therapy Evaluation Patient Details Name: Alexandria Chen MRN: [id number] DOB: [birth year] Today's Date: 11/30/2016   History of Present Illness  Alexandria Chen is a 82 y.o. female with medical history significant of recent diagnosis of atrial fibrillation, on anticoagulation, lung cancer status post radiation and chemotherapy with admission 7/21-22 for probably vasovagal syncope.  Since discharge, she has been falling lately at home.  She was going to the bathroom overnight and she landed on the floor.  She has complained about back, head, and buttocks were hurting.  Today, they were helping her to bathe and she passed out. She was sitting in the bathtub when she became unresponsive and fell backwards.  It was supervised since family was helping her bathe.  Family now says she was never unconscious but would not respond.  She recently completed chemo/rads, did well during the treatment but she hd a similar episode to this last week.  No fevers. +cough - worsening, given Mucinex yesterday.  Intermittent n/v but better since last week.  She was previously constipated and this was treated and so stools are intermittently watery over the last 3 weeks.  No urinary symptoms.  She was seen by her PCP 1D PTA and reported feeling weak, light-headed, and dizzy with extreme fatigue.  Clinical Impression  Pt admitted with above diagnosis. Pt currently with functional limitations due to the deficits listed below (see "PT Problem List"). Upon entry, the patient is received semirecumbent in bed, daughter Verdis Frederickson arrives shortly thereafter and then another daughter at end of session. The pt is awake and agreeable to participate. No acute distress noted at this time Pt desaturates into mid 80s SpO2 when room air is trialed, and she has orthostatic hypotension when amb is attempted, although she tolerates standing for 3 minutes with supervision level balance. HR is controlled, never exceeds 100bpm. Functional mobility  assessment demonstrates moderate weakness, the pt now requiring maximal effort for bed mobility, minGuard assist for transfers from elevated surface, and unable to amb meaningful distances. Pt will benefit from skilled PT intervention to increase independence and safety with basic mobility in preparation for discharge to the venue listed below.       Follow Up Recommendations SNF    Equipment Recommendations  Rolling walker with 5" wheels    Recommendations for Other Services       Precautions / Restrictions Precautions Precautions: Fall Precaution Comments: no score available; pt is orthostatic c falls history  Restrictions Weight Bearing Restrictions: No      Mobility  Bed Mobility Overal bed mobility: Needs Assistance Bed Mobility: Supine to Sit     Supine to sit: Supervision     General bed mobility comments: very labored, weak, and slow  Transfers Overall transfer level: Needs assistance Equipment used: 1 person hand held assist Transfers: Sit to/from Stand Sit to Stand: From elevated surface         General transfer comment: MinGuard assist with 4 hands held; tolerates standing x3 minutes for orthostatic vitals;   Ambulation/Gait Ambulation/Gait assistance:  (upon initiation, pt reports feelign 'woozy')              Stairs            Wheelchair Mobility    Modified Rankin (Stroke Patients Only)       Balance  Pertinent Vitals/Pain Pain Assessment: No/denies pain    Home Living Family/patient expects to be discharged to:: Private residence Living Arrangements: Other relatives Available Help at Discharge: Family (1 daughter is across the street) Type of Home: Apartment Home Access: Stairs to enter Entrance Stairs-Rails: Right Entrance Stairs-Number of Steps: 3 Home Layout: One level Home Equipment: Cane - single point      Prior Function Level of Independence:  Independent with assistive device(s)         Comments: was still driving and accessing the community a few weeks ago per daughter Danelle Earthly Dominance        Extremity/Trunk Assessment   Upper Extremity Assessment Upper Extremity Assessment: Generalized weakness    Lower Extremity Assessment Lower Extremity Assessment: Generalized weakness       Communication   Communication: No difficulties  Cognition Arousal/Alertness: Awake/alert Behavior During Therapy: WFL for tasks assessed/performed Overall Cognitive Status: Within Functional Limits for tasks assessed                                 General Comments: orientation questions requires some time and effort for correct response.       General Comments      Exercises     Assessment/Plan    PT Assessment Patient needs continued PT services  PT Problem List Decreased activity tolerance;Decreased mobility;Cardiopulmonary status limiting activity       PT Treatment Interventions Functional mobility training;Therapeutic activities;Therapeutic exercise;Balance training    PT Goals (Current goals can be found in the Care Plan section)  Acute Rehab PT Goals Patient Stated Goal: tolerate househodl distance AMB  PT Goal Formulation: With patient Time For Goal Achievement: 12/14/16 Potential to Achieve Goals: Fair    Frequency Min 2X/week   Barriers to discharge Inaccessible home environment      Co-evaluation               AM-PAC PT "6 Clicks" Daily Activity  Outcome Measure Difficulty turning over in bed (including adjusting bedclothes, sheets and blankets)?: A Lot Difficulty moving from lying on back to sitting on the side of the bed? : A Lot Difficulty sitting down on and standing up from a chair with arms (e.g., wheelchair, bedside commode, etc,.)?: Total Help needed moving to and from a bed to chair (including a wheelchair)?: Total Help needed walking in hospital room?:  Total Help needed climbing 3-5 steps with a railing? : Total 6 Click Score: 8    End of Session Equipment Utilized During Treatment: Oxygen Activity Tolerance: Treatment limited secondary to medical complications (Comment) Patient left: in chair;with call bell/phone within reach;with family/visitor present Nurse Communication: Mobility status PT Visit Diagnosis: Other abnormalities of gait and mobility (R26.89);Muscle weakness (generalized) (M62.81);Unsteadiness on feet (R26.81)    Time: 1013-1050 PT Time Calculation (min) (ACUTE ONLY): 37 min   Charges:   PT Evaluation $PT Eval Moderate Complexity: 1 Mod PT Treatments $Therapeutic Activity: 8-22 mins   PT G Codes:        12:39 PM, December 22, 2016 Etta Grandchild, PT, DPT Physical Therapist - [place] [phone] (Office)  Buccola,Allan C December 22, 2016, 12:36 PM

## 2016-11-30 NOTE — Clinical Social Work Note (Signed)
Clinical Social Work Assessment  Patient Details  Name: Alexandria Chen MRN: [id number] Date of Birth: Sep 28, 1934  Date of referral:  11/30/16               Reason for consult:  Facility Placement, Discharge Planning, [employer]                Permission sought to share information with:  Tourist information centre manager, Customer service manager, Family Supports Permission granted to share information::  Yes, Verbal Permission Granted  Name::        Agency::     Relationship::  sisters, granddaughter, daughter   Sport and exercise psychologist Information:     Housing/Transportation Living arrangements for the past 2 months:  Single Family Home Source of Information:  Patient, Medical Team, Case Manager, Facility, Adult Children Patient Interpreter Needed:  None Criminal Activity/Legal Involvement Pertinent to Current Situation/Hospitalization:  No - Comment as needed Significant Relationships:  Adult Children, Other Family Members Lives with:  Self Do you feel safe going back to the place where you live?  Yes Need for family participation in patient care:  Yes (Comment)  Care giving concerns:  Patient lives alone at home and admitted to hospital due to fall and LOC.  Patient lives alone and has just completed chemotherapy. Family reports prior to admission she was still driving, managing the household and fairly independent up until about 2 weeks ago as she fell.  Family feels this is secondary to chemotherapy.  Family reports they are thinking about ST rehab, but there are multiple family members involved and all need to agree on the discharge plan.    Family is asking if patient could do outpatient rehab instead of rehab at the facility.  LCSW will defer to PT to assess if patient appropriate for outpatient vs SNF.  Educated family on role and recommendations.  Family at this time is deferring decision as they want to discuss with other members and questioned about Life alert and other community resources.  LCSW  answered all insurance questions from a SNF standpoint and process if SNF is elected.  Discussed with family the option to work up and have options to think about regarding SNF as family would not be rushed on day of DC in which they declined and would discuss on Wednesday 8/1 once decision has been made.   Social Worker assessment / plan:  Assessment completed. Consulted for placement options and disposition.  Family unclear of plan, no SNF work up completed as family has not given permission.  Patient will be a level 2 passar secondary to MDD and anxiety/medications.    Plan;  TBD, discussed with PT other options.   Awaiting family decision.  Employment status:  Retired Forensic scientist:  [employer] PT Recommendations:  [place], [place] / Referral to community resources:  [place], Other (Comment Required) (Life alert)  Patient/Family's Response to care:  Undecided  Patient/Family's Understanding of and Emotional Response to Diagnosis, Current Treatment, and Prognosis:  Family still deciding and planning for disposition.   Emotional Assessment Appearance:  Appears stated age Attitude/Demeanor/Rapport:    Affect (typically observed):  Quiet, Other (resting in bed, discussion with family) Orientation:  Oriented to Self, Oriented to Place Alcohol / Substance use:  Not Applicable Psych involvement (Current and /or in the community):  No (Comment)  Discharge Needs  Concerns to be addressed:  Adjustment to Illness, Care Coordination Readmission within the last 30 days:  Yes Current discharge risk:  None Barriers to Discharge:  Continued Medical Work up   Lilly Cove, [place] 11/30/2016, 1:50 PM

## 2016-12-01 ENCOUNTER — Encounter (HOSPITAL_COMMUNITY): Payer: Self-pay | Admitting: Internal Medicine

## 2016-12-01 DIAGNOSIS — I4891 Unspecified atrial fibrillation: Secondary | ICD-10-CM

## 2016-12-01 LAB — CBC
HEMATOCRIT: 22.6 % — AB (ref 36.0–46.0)
Hemoglobin: 7.6 g/dL — ABNORMAL LOW (ref 12.0–15.0)
MCH: 29.1 pg (ref 26.0–34.0)
MCHC: 33.6 g/dL (ref 30.0–36.0)
MCV: 86.6 fL (ref 78.0–100.0)
PLATELETS: 330 10*3/uL (ref 150–400)
RBC: 2.61 MIL/uL — ABNORMAL LOW (ref 3.87–5.11)
RDW: 16.1 % — AB (ref 11.5–15.5)
WBC: 7.5 10*3/uL (ref 4.0–10.5)

## 2016-12-01 LAB — BASIC METABOLIC PANEL
ANION GAP: 9 (ref 5–15)
BUN: 11 mg/dL (ref 6–20)
CALCIUM: 8.6 mg/dL — AB (ref 8.9–10.3)
CO2: 26 mmol/L (ref 22–32)
CREATININE: 0.74 mg/dL (ref 0.44–1.00)
Chloride: 99 mmol/L — ABNORMAL LOW (ref 101–111)
Glucose, Bld: 105 mg/dL — ABNORMAL HIGH (ref 65–99)
Potassium: 3.8 mmol/L (ref 3.5–5.1)
SODIUM: 134 mmol/L — AB (ref 135–145)

## 2016-12-01 LAB — PROTIME-INR
INR: 3.7
PROTHROMBIN TIME: 37.6 s — AB (ref 11.4–15.2)

## 2016-12-01 LAB — ABO/RH: ABO/RH(D): O POS

## 2016-12-01 LAB — HEMOGLOBIN AND HEMATOCRIT, BLOOD
HCT: 26.6 % — ABNORMAL LOW (ref 36.0–46.0)
Hemoglobin: 9.3 g/dL — ABNORMAL LOW (ref 12.0–15.0)

## 2016-12-01 LAB — PREPARE RBC (CROSSMATCH)

## 2016-12-01 LAB — GLUCOSE, CAPILLARY: GLUCOSE-CAPILLARY: 122 mg/dL — AB (ref 65–99)

## 2016-12-01 MED ORDER — HYDRALAZINE HCL 20 MG/ML IJ SOLN
10.0000 mg | Freq: Once | INTRAMUSCULAR | Status: AC
  Administered 2016-12-01: 10 mg via INTRAVENOUS
  Filled 2016-12-01: qty 1

## 2016-12-01 MED ORDER — SODIUM CHLORIDE 0.9 % IV SOLN: Freq: Once | INTRAVENOUS | Status: AC

## 2016-12-01 MED ORDER — LORAZEPAM 2 MG/ML IJ SOLN
0.5000 mg | INTRAMUSCULAR | Status: DC | PRN
  Filled 2016-12-01: qty 1

## 2016-12-01 MED ORDER — ALBUTEROL SULFATE HFA 108 (90 BASE) MCG/ACT IN AERS: 2.0000 | INHALATION_SPRAY | Freq: Four times a day (QID) | RESPIRATORY_TRACT | Status: DC | PRN

## 2016-12-01 MED ORDER — ALBUTEROL SULFATE (2.5 MG/3ML) 0.083% IN NEBU: 2.5000 mg | INHALATION_SOLUTION | RESPIRATORY_TRACT | Status: DC | PRN

## 2016-12-01 NOTE — NC FL2 (Signed)
[place] LEVEL OF CARE SCREENING TOOL     IDENTIFICATION  Patient Name: Alexandria Chen Birthdate: February 11, 1935 Sex: female Admission Date (Current Location): 11/27/2016  [facility] and [place] Number:  [employer] and Address:  [facility] [address] [place]      Provider Number: [id number]  Attending Physician Name and Address:  Orvan Falconer, MD  Relative Name and Phone Number:       Current Level of Care: Hospital Recommended Level of Care: [place] Prior Approval Number:    Date Approved/Denied:   PASRR Number:    Discharge Plan: SNF    Current Diagnoses: Patient Active Problem List   Diagnosis Date Noted  . Anemia, unspecified 11/28/2016  . Warfarin anticoagulation 11/28/2016  . Sepsis ([place]) 11/27/2016  . Syncope 11/20/2016  . Atrial fibrillation ([place]) [I48.91] 09/09/2016  . Squamous cell carcinoma of right lung ([place]) 07/13/2016  . Syncope and collapse 04/14/2015  . Otitis externa of right ear 12/18/2014  . Hearing loss due to cerumen impaction 12/18/2014  . Essential hypertension, benign 01/14/2014  . Hyperlipidemia 01/14/2014  . Vitamin D deficiency 01/14/2014  . Alcohol dependence in remission ([place]) 10/15/2011    Class: Acute  . Depression 08/31/2011  . Anxiety 08/31/2011    Orientation RESPIRATION BLADDER Height & Weight     Self, Time, Situation, Place  Normal Continent, External catheter Weight: 131 lb 13.4 oz (59.8 kg) Height:  5\' 2"  (157.5 cm)  BEHAVIORAL SYMPTOMS/MOOD NEUROLOGICAL BOWEL NUTRITION STATUS      Continent Diet (regular diet, see dc summary)  AMBULATORY STATUS COMMUNICATION OF NEEDS Skin   Extensive Assist Verbally Normal                       Personal Care Assistance Level of Assistance  Bathing, Feeding, Dressing Bathing Assistance: Limited assistance Feeding assistance: Limited assistance Dressing Assistance: Limited assistance     Functional Limitations  Info  Sight, Hearing, Speech Sight Info: Adequate Hearing Info: Adequate Speech Info: Adequate    SPECIAL CARE FACTORS FREQUENCY  PT (By licensed PT), OT (By licensed OT)     PT Frequency: 5x a week OT Frequency: 5x a week            Contractures Contractures Info: Not present    Additional Factors Info  Code Status, Allergies Code Status Info: Full Code Allergies Info: NKA    Psych meds: Cymbalta, Neurontin, Buspar        Current Medications (12/01/2016):  This is the current hospital active medication list Current Facility-Administered Medications  Medication Dose Route Frequency Provider Last Rate Last Dose  . 0.9 %  sodium chloride infusion   Intravenous Once Rexene Alberts, MD      . 0.9 %  sodium chloride infusion   Intravenous Continuous Orvan Falconer, MD 50 mL/hr at 11/30/16 1510    . acetaminophen (TYLENOL) tablet 650 mg  650 mg Oral Q6H PRN Karmen Bongo, MD   650 mg at 12/01/16 5329   Or  . acetaminophen (TYLENOL) suppository 650 mg  650 mg Rectal Q6H PRN Karmen Bongo, MD      . albuterol (PROVENTIL) (2.5 MG/3ML) 0.083% nebulizer solution 2.5 mg  2.5 mg Nebulization Q4H PRN Orvan Falconer, MD      . amiodarone (PACERONE) tablet 200 mg  200 mg Oral Daily Karmen Bongo, MD   200 mg at 12/01/16 9242  . aspirin EC tablet 81 mg  81  mg Oral Daily Karmen Bongo, MD   81 mg at 12/01/16 4431  . atorvastatin (LIPITOR) tablet 40 mg  40 mg Oral q1800 Karmen Bongo, MD   40 mg at 11/30/16 1834  . busPIRone (BUSPAR) tablet 15 mg  15 mg Oral BID Karmen Bongo, MD   15 mg at 12/01/16 5400  . cyanocobalamin ((VITAMIN B-12)) injection 1,000 mcg  1,000 mcg Intramuscular Daily Charlynne Cousins, MD   1,000 mcg at 12/01/16 [phone]  . docusate sodium (COLACE) capsule 100 mg  100 mg Oral BID Karmen Bongo, MD   100 mg at 12/01/16 1950  . DULoxetine (CYMBALTA) DR capsule 60 mg  60 mg Oral Daily Karmen Bongo, MD   60 mg at 12/01/16 9326  . gabapentin (NEURONTIN) capsule 100 mg   100 mg Oral QHS Karmen Bongo, MD   100 mg at 11/30/16 2242  . meclizine (ANTIVERT) tablet 25 mg  25 mg Oral TID PRN Karmen Bongo, MD      . ondansetron [facility]) tablet 4 mg  4 mg Oral Q6H PRN Karmen Bongo, MD   4 mg at 11/30/16 0507   Or  . ondansetron [facility]) injection 4 mg  4 mg Intravenous Q6H PRN Karmen Bongo, MD   4 mg at 11/29/16 1300  . pantoprazole (PROTONIX) EC tablet 40 mg  40 mg Oral Daily Karmen Bongo, MD   40 mg at 12/01/16 7124  . piperacillin-tazobactam (ZOSYN) IVPB 3.375 g  3.375 g Intravenous Lynne Logan, MD   Stopped at 12/01/16 1014  . polyethylene glycol (MIRALAX / GLYCOLAX) packet 17 g  17 g Oral BID Charlynne Cousins, MD   17 g at 12/01/16 [phone]  . sodium chloride flush (NS) 0.9 % injection 3 mL  3 mL Intravenous Q12H Karmen Bongo, MD   3 mL at 11/30/16 1215  . traMADol (ULTRAM) tablet 100 mg  100 mg Oral Q6H PRN Charlynne Cousins, MD   100 mg at 11/30/16 1835  . Warfarin - Pharmacist Dosing Inpatient   Does not apply [id number] Rexene Alberts, MD   Stopped at 11/29/16 1600     Discharge Medications: Please see discharge summary for a list of discharge medications.  Relevant Imaging Results:  Relevant Lab Results:   Additional Information SSN:  [id number]    Current at [facility]  and has completed chemotherapy.  Will follow up soon regarding her plans for CA treatment.  She does radiation in [place].   Patient with Dr. Harrington Challenger at [facility] for medications and Maurice Small with counseling (PRN)  Lilly C[place], LCSW

## 2016-12-01 NOTE — Progress Notes (Signed)
Pharmacy Antibiotic Note  Alexandria Chen is a 82 y.o. female admitted on 11/27/2016 with sepsis.  Pharmacy has been consulted for Zosyn dosing. Patient is now afebrile. D/W Dr. Marin Comment, will deescalate tx and D/c Vancomycin. Cultures are negative to date   Plan: Continue Zosyn 3.375g IV q8h (4 hour infusion). F/U cxs and clinical progress Monitor V/S, labs, and levels as indicated  Height: 5\' 2"  (157.5 cm) Weight: 131 lb 13.4 oz (59.8 kg) IBW/kg (Calculated) : 50.1  Temp (24hrs), Avg:98.9 F (37.2 C), Min:98.3 F (36.8 C), Max:99.5 F (37.5 C)   Recent Labs Lab 11/27/16 1215 11/27/16 1332 11/27/16 1545 11/28/16 0711 11/29/16 0744 11/30/16 0620 12/01/16 0628  WBC 7.0  --  8.0 8.5 9.4 7.2 7.5  CREATININE 0.91  --   --  0.85 0.69 0.78 0.74  LATICACIDVEN  --  1.8 1.5  --   --   --   --     Estimated Creatinine Clearance: 44.4 mL/min (by C-G formula based on SCr of 0.74 mg/dL).    No Known Allergies  Antimicrobials this admission: Vancomycin 7/28 >> 8/1 Zosyn 7/28 >>  Dose adjustments this admission: N/A  Microbiology results: 7/28 BCx: ngtd 7/28 UCx: no growth  Thank you for allowing pharmacy to be a part of this patient's care.  Isac Sarna, BS Pharm D, BCPS Clinical Pharmacist Pager [phone] 12/01/2016 8:40 AM

## 2016-12-01 NOTE — Progress Notes (Signed)
Patient tolerated BP assessment lying, sitting and standing. Patient was shaky and could not stand for 3 minutes to assess BP again. Patient resting in bed at this time. No distress noted.

## 2016-12-01 NOTE — Progress Notes (Signed)
[facility] for Coumadin Indication: atrial fibrillation  No Known Allergies  Patient Measurements: Height: 5\' 2"  (157.5 cm) Weight: 131 lb 13.4 oz (59.8 kg) IBW/kg (Calculated) : 50.1  Vital Signs: Temp: 98.9 F (37.2 C) (08/01 0500) Temp Source: Oral (08/01 0500) BP: 156/82 (08/01 0500) Pulse Rate: 106 (08/01 0500)  Labs:  Recent Labs  11/29/16 0744 11/30/16 0620 12/01/16 0628  HGB 7.8* 8.0* 7.6*  HCT 23.4* 23.0* 22.6*  PLT 282 304 330  LABPROT 34.5* 36.0* 37.6*  INR 3.32 3.50 3.70  CREATININE 0.69 0.78 0.74   Estimated Creatinine Clearance: 44.4 mL/min (by C-G formula based on SCr of 0.74 mg/dL).  Medical History: Past Medical History:  Diagnosis Date  . Alcohol abuse, in remission   . Anxiety   . Atrial fibrillation ([place])   . Depression   . Dyspnea    walking distances   . Dysrhythmia   . History of bronchitis   . HTN (hypertension)   . Mass of right lung 07/13/2016  . Osteopenia   . Squamous cell carcinoma of right lung ([place]) 07/13/2016   Medications:  Prescriptions Prior to Admission  Medication Sig Dispense Refill Last Dose  . amiodarone (PACERONE) 200 MG tablet Take 200 mg by mouth daily.    11/26/2016 at Unknown time  . aspirin EC 81 MG tablet Take 81 mg by mouth daily.   11/26/2016 at Unknown time  . atorvastatin (LIPITOR) 40 MG tablet TAKE 1 TABLET (40 MG TOTAL) BY MOUTH DAILY. FOR HYPERLIPIDEMIA 90 tablet 1 11/26/2016 at Unknown time  . busPIRone (BUSPAR) 15 MG tablet Take 1 tablet (15 mg total) by mouth 2 (two) times daily. 180 tablet 2 11/26/2016 at Unknown time  . Calcium Carb-Cholecalciferol (CALCIUM 500+D3) 500-400 MG-UNIT TABS Take 1 tablet by mouth 2 (two) times daily.   11/26/2016 at Unknown time  . docusate calcium (SURFAK) 240 MG capsule Take 1 capsule (240 mg total) by mouth daily as needed for mild constipation. 30 capsule 1 11/26/2016 at Unknown time  . DULoxetine (CYMBALTA) 60 MG capsule Take 1 capsule  (60 mg total) by mouth daily. 30 capsule 2 11/26/2016 at Unknown time  . fish oil-omega-3 fatty acids 1000 MG capsule Take 3 capsules (3 g total) by mouth daily. For hyperlipidemia. (Patient taking differently: Take 1 g by mouth 3 (three) times daily. For hyperlipidemia.) 30 capsule  11/26/2016 at Unknown time  . fluticasone (FLONASE) 50 MCG/ACT nasal spray Place 2 sprays into both nostrils 2 (two) times daily. Use as needed (Patient taking differently: Place 2 sprays into both nostrils 2 (two) times daily as needed for allergies. Use as needed) 16 g 6 11/26/2016 at Unknown time  . gabapentin (NEURONTIN) 100 MG capsule Take 1 capsule (100 mg total) by mouth at bedtime. 90 capsule 2 11/26/2016 at Unknown time  . lisinopril (PRINIVIL,ZESTRIL) 5 MG tablet Take 5 mg by mouth.   11/26/2016 at Unknown time  . meclizine (ANTIVERT) 25 MG tablet Take 25 mg by mouth 3 (three) times daily as needed for dizziness.   11/26/2016 at Unknown time  . nitroGLYCERIN (NITROSTAT) 0.4 MG SL tablet Place 0.4 mg under the tongue.   11/26/2016 at Unknown time  . omeprazole (PRILOSEC) 40 MG capsule Take 1 capsule (40 mg total) by mouth daily. 30 capsule 2 11/26/2016 at Unknown time  . warfarin (COUMADIN) 2.5 MG tablet Take 2.5 mg by mouth daily.    11/26/2016 at 0800   Assessment: 82 y.o. female with  medical history significant of recent diagnosis of atrial fibrillation, on chronic anticoagulation. Lung cancer status post radiation and chemotherapy with admission 7/21-22 for probably vasovagal syncope. Patient reported feeling weak, light-headed, and dizzy with extreme fatigue. Admitted with sepsis. Pharmacy consulted to manage coumadin.  INR > 3 still elevated today.   Goal of Therapy:  INR 2-3 Monitor platelets by anticoagulation protocol: Yes   Plan:  HOLD coumadin today, allow INR to trend down PT-INR daily Monitor for S/S of bleeding  Isac Sarna, BS Vena Austria, BCPS Clinical Pharmacist Pager [phone]  12/01/2016,8:34  AM

## 2016-12-01 NOTE — Clinical Social Work Placement (Addendum)
   CLINICAL SOCIAL WORK PLACEMENT  NOTE  Date:  12/01/2016  Patient Details  Name: Alexandria Chen MRN: [id number] Date of Birth: 10-Feb-1935  Clinical Social Work is seeking post-discharge placement for this patient at the [facility] level of care (*CSW will initial, date and re-position this form in  chart as items are completed):  Yes   Patient/family provided with [facility] Work Department's list of facilities offering this level of care within the geographic area requested by the patient (or if unable, by the patient's family).  Yes   Patient/family informed of their freedom to choose among providers that offer the needed level of care, that participate in Medicare, Medicaid or managed care program needed by the patient, have an available bed and are willing to accept the patient.  Yes   Patient/family informed of [facility]'s ownership interest in [facility] and [facility], as well as of the fact that they are under no obligation to receive care at these facilities.  PASRR submitted to EDS on 12/01/16     PASRR number received on      12/02/2016  [id number] E     Existing PASRR number confirmed on       FL2 transmitted to all facilities in geographic area requested by pt/family on 12/01/16     FL2 transmitted to all facilities within larger geographic area on       Patient informed that his/her managed care company has contracts with or will negotiate with certain facilities, including the following:            Patient/family informed of bed offers received.  Patient chooses bed at       Physician recommends and patient chooses bed at      Patient to be transferred to   on  .  Patient to be transferred to facility by       Patient family notified on   of transfer.  Name of family member notified:        PHYSICIAN Please sign FL2     Additional Comment:    _______________________________________________ Lilly Cove,  LCSW 12/01/2016, 11:57 AM

## 2016-12-01 NOTE — Progress Notes (Signed)
Patient is confused and agitated.  Family states that this is not her normal behavior.  Explained to family that patient is elderly and in an unfamiliar environment as well as sick and sometimes that can contribute to this behavior.  Family still insistent that this is not normal.  Patient has no other symptoms besides confusion.  Midlevel MD notified.  Was instructed by the MD to explain again that the patient is elderly, sick and in an unfamiliar environment.  The MD also instructed me to let the family know that the only thing we could do for confusing is watch the patient.  I let the family know what the MD say and the family still not excepting this answer.  Instructed by the midlevel to call the on call physician if family still pushing for something else to be done.  Will continue to monitor the patient.

## 2016-12-01 NOTE — Progress Notes (Signed)
Physical Therapy Treatment Patient Details Name: Alexandria Chen MRN: [id number] DOB: 11-17-1934 Today's Date: 12/01/2016    History of Present Illness Alexandria Chen is a 82 y.o. female with medical history significant of recent diagnosis of atrial fibrillation, on anticoagulation, lung cancer status post radiation and chemotherapy with admission 7/21-22 for probably vasovagal syncope.  Since discharge, she has been falling lately at home.  She was going to the bathroom overnight and she landed on the floor.  She has complained about back, head, and buttocks were hurting.  Today, they were helping her to bathe and she passed out. She was sitting in the bathtub when she became unresponsive and fell backwards.  It was supervised since family was helping her bathe.  Family now says she was never unconscious but would not respond.  She recently completed chemo/rads, did well during the treatment but she hd a similar episode to this last week.  No fevers. +cough - worsening, given Mucinex yesterday.  Intermittent n/v but better since last week.  She was previously constipated and this was treated and so stools are intermittently watery over the last 3 weeks.  No urinary symptoms.  She was seen by her PCP 1D PTA and reported feeling weak, light-headed, and dizzy with extreme fatigue.      PT Comments    CHart reviewed, RN consulted. H/H largely unchanged since 1DA, pt now with orders for 1 unit PRBC. RN reports pt is appropriate for session while order is being completed. Pt received supine in bed, SIL and grandson in room, and daughter later enters. Pt agreeable to participate in PT session, c/o increased left sided HA today. Ortho vitals attempted again this session, BP dropping with supine to sitting ,but eventually returning to supine levels after 5 minutes at EOB. HR remains tachy while seated. Pt participated in seated trunk exercises at EOB to increase tolerance to verticalized posturing and preservation of  trunk strength. After 20 minutes, pt becomes more confused and fatigued and is returned to supine. Demonstrated positive reproduction/exacerbation of left sided HA with ram's horn distribution after palpation of Left upper trapezius and myofascial release is performed with gentle P/ROM of cervical rotation: HA pain decreases from 8/10 to 5/10. Education given to family on how PT will help with improving tolerance to upright postures and education of pillow placement while in bed to avoid further aggravation or cervicogenic HA. Pt making progress toward goal with improved strength supine to seated EOB, however vitals response remains poor at this time.    12/01/16 1500  Therapy Vitals  Pulse Rate (!) 115  BP (!) 128/92 (after sitting EOB for 5 minutes)  Patient Position (if appropriate) Orthostatic Vitals  Orthostatic Lying   BP- Lying 132/64  Pulse- Lying 97  Orthostatic Sitting  BP- Sitting 118/70  Pulse- Sitting 114     Follow Up Recommendations  SNF     Equipment Recommendations  Rolling walker with 5" wheels    Recommendations for Other Services       Precautions / Restrictions Precautions Precautions: Fall Precaution Comments: no score available; pt is orthostatic c falls history  Restrictions Weight Bearing Restrictions: No    Mobility  Bed Mobility Overal bed mobility: Needs Assistance Bed Mobility: Supine to Sit     Supine to sit: Supervision     General bed mobility comments: very labored, weak, and slow  Transfers Overall transfer level:  (not attempted at this time; BP drop with supine to sitting, subjectively feeling poorly. )  Ambulation/Gait                 Stairs            Wheelchair Mobility    Modified Rankin (Stroke Patients Only)       Balance Overall balance assessment: Needs assistance Sitting-balance support: No upper extremity supported;Feet supported Sitting balance-Leahy Scale: Good                                       Cognition Arousal/Alertness: Awake/alert Behavior During Therapy: WFL for tasks assessed/performed Overall Cognitive Status: Within Functional Limits for tasks assessed (some difficulty following instructions seated EOB )                                        Exercises Other Exercises Other Exercises: Seated Marching at EOB: 10x bilat Other Exercises: Seated Bilat Shoulder Scaption: 1x10 bilat Other Exercises: Cross body reaching and trunk rotation: 1x10 bilat.  Other Exercises: Myofascial Release Left Upper Trapezius Muscle: HA decreaess from 8/10 to 5/10     General Comments        Pertinent Vitals/Pain      Home Living                      Prior Function            PT Goals (current goals can now be found in the care plan section) Acute Rehab PT Goals Patient Stated Goal: tolerate househodl distance AMB  PT Goal Formulation: With patient Time For Goal Achievement: 12/14/16 Potential to Achieve Goals: Fair Progress towards PT goals: Not progressing toward goals - comment    Frequency    Min 2X/week      PT Plan Current plan remains appropriate    Co-evaluation              AM-PAC PT "6 Clicks" Daily Activity  Outcome Measure  Difficulty turning over in bed (including adjusting bedclothes, sheets and blankets)?: A Little Difficulty moving from lying on back to sitting on the side of the bed? : A Lot Difficulty sitting down on and standing up from a chair with arms (e.g., wheelchair, bedside commode, etc,.)?: Total Help needed moving to and from a bed to chair (including a wheelchair)?: Total Help needed walking in hospital room?: Total Help needed climbing 3-5 steps with a railing? : Total 6 Click Score: 9    End of Session   Activity Tolerance: Treatment limited secondary to medical complications (Comment);Patient limited by fatigue;Patient limited by lethargy Patient left: in  bed;with call bell/phone within reach;with family/visitor present   PT Visit Diagnosis: Other abnormalities of gait and mobility (R26.89);Muscle weakness (generalized) (M62.81);Unsteadiness on feet (R26.81)     Time: 3536-1443 PT Time Calculation (min) (ACUTE ONLY): 30 min  Charges:  $Therapeutic Activity: 23-37 mins                    G Codes:      9:48 PM, 2016/12/11 Etta Grandchild, PT, DPT Physical Therapist - [place] [phone] (Office)   Buccola,Allan C 2016-12-11, 9:40 PM

## 2016-12-01 NOTE — Progress Notes (Signed)
Patient ID: MESHA SCHAMBERGER, female   DOB: [birth year], 82 y.o.   MRN: [id number] Night shift progress note.  The patient was seen and examined due to confusion. Her chart was reviewed. She was admitted for syncope and collapse, sepsis and has worsening anemia. She also has a history of lung cancer, currently undergoing treatment with oncology.   Per patient's daughter, Lucia Estelle, she has been increasingly confused and restless since earlier in the day. Her most recent vital signs are temperature 99.55F, pulse 103, blood pressure 176/101 mmHg, respirations 18 and O2 sat 93% on nasal cannula oxygen.   She is awake, alert, mildly restless initially, but oriented to name, place, partially oriented to situation and time with some difficulty. She is able to recognize her daughter and recall her home address with some difficulty as well. HEENT normocephalic, oral mucosa is moist, neck is supple, lungs sounds are decreased on the bases, but are otherwise CTA. CV, S1 and S2, RRR. Abdomen soft, nontender. Extremities show no edema. Neurological is grossly nonfocal. No gross cranial nerve deficits. She denies focal weakness or numbness. No pronator drift on exam, normal nose to finger and moves all extremities.  Assessment and plan:  This is likely sundowning due to early dementia. The patient has a history of heavy cigarette smoking of 2 PPD for 60 years, previously used alcohol, her mother had a history of dementia, has worsening anemia, is hospitalized with sepsis and has atrophy/microvascular changes, but no acute stroke on recent CT scan. Her INR is therapeutic. I do not feel that the patient is having an acute cerebral embolic or thrombotic event. However, I will try to get a CT scan of the brain without contrast to rule any acute abnormalities. Lorazepam 0.5 mg IVP every 4 hours as needed for restlessness or insomnia was ordered.  Over 30 minutes of time were spent during the evaluation of this  patient.   Tennis Must, M.D.

## 2016-12-01 NOTE — Progress Notes (Signed)
LCSW following for disposition:  New SNF placement  LCSW met with another sister/daughter of patient at bedside this AM who is amendable to placement reporting that patient would benefit for short term rehab to get stronger.  Reports other family members are hoping she can return home with home health, however patient has not improved and still not able to stand by herself for ortho vitals this morning. Patient currently sleeping in bed and did not awake to participate. Deferring to family for decisions.  Daughter reports preference would be [facility] or [facility] due to convenience and will continue to speak with family regarding placement. Daughter reports patient will have family support her throughout SNF rehab stay and then return home.  LCSW completed SNF work up and submitted for passar. Passar pending at this time due to MDD and anxiety dx, but should receive a level 1 or a  30 day passar pending nursing review.  Will follow up.  Lane Hacker, MSW Clinical Social Work: [employer] Coverage for :

## 2016-12-01 NOTE — Progress Notes (Signed)
PROGRESS NOTE    Alexandria Chen  CNO:[id number] DOB: 06-25-1934 DOA: 11/27/2016 PCP: [name], FNP    Brief Narrative: Alexandria Chen an 82 y.o.femalepast medical history significant for chronic atrial fibrillation on Coumadin, lung cancer status post radiation chemotherapy, discharge from the hospital on 11/21/2016 for vasovagal syncope. Since then she's been falling at home, she relates she is lightheaded. She denies any loss of consciousness. No head trauma. Assessment and ED with a temperature of 101.  She was Tx for sepsis of unclear source.  BC have remained negative.  She was also found to have low BP and has been on low dose ACE I.  It should be noted that her CT of the chest reviewed a new partially cavitary lesion within the posteromedial RLL (intermediate--cannot r/out disease progression.)  She continued to feel better and Cultures remained negative.  She had PT, and PT suggested SNF placement.  Family and patient is agreeable.    Assessment & Plan:   Principal Problem:   Syncope and collapse Active Problems:   Essential hypertension, benign   Squamous cell carcinoma of right lung ([facility])   Atrial fibrillation ([facility]) [I48.91]   Sepsis ([place])   Anemia, unspecified   Warfarin anticoagulation   1/  Possible sepsis: She was started at first on IV Van/Zosyn, and Lucianne Lei was discontinued today as cultures remained negative.  She is now afebrile.    2/  Syncope:  Vasovagal possible.  But her BP was too low and it is possible she is over Tx with BP meds.  have d/c lisinopril and IVF given.  Would allow higher BP outpatient before initiate any Tx.  3/   Lung CA:  She will follow up with oncology.  4/  Afib: Will continue with coumadin per pharmacy dosing.    5/  Anemia:  Suspect dilution and plebotomy.  Will transfuse 1 unit of PRBC.   DVT prophylaxis: Coumadin.  Code Status: FULL CODE.  Family Communication: daughter [name] at her bedside. Another daughter [name] is here as well  today.  Disposition Plan: Home.    Consultants: none.   Procedures:   None.   Antimicrobials: Anti-infectives    Start     Dose/Rate Route Frequency Ordered Stop   11/28/16 1600  vancomycin (VANCOCIN) IVPB 1000 mg/200 mL premix  Status:  Discontinued     1,000 mg 200 mL/hr over 60 Minutes Intravenous Every 24 hours 11/27/16 1906 12/01/16 0840   11/27/16 2200  piperacillin-tazobactam (ZOSYN) IVPB 3.375 g     3.375 g 12.5 mL/hr over 240 Minutes Intravenous Every 8 hours 11/27/16 1906     11/27/16 1545  vancomycin (VANCOCIN) IVPB 1000 mg/200 mL premix     1,000 mg 200 mL/hr over 60 Minutes Intravenous  Once 11/27/16 1534 11/28/16 0518   11/27/16 1545  piperacillin-tazobactam (ZOSYN) IVPB 3.375 g     3.375 g 100 mL/hr over 30 Minutes Intravenous  Once 11/27/16 1534 11/28/16 0519       Subjective:  Patient is feeling better.   Objective: Vitals:   11/30/16 1355 11/30/16 2211 11/30/16 2230 12/01/16 0500  BP: 127/75 (!) 131/100 (!) 145/75 (!) 156/82  Pulse: 90 61 (!) 101 (!) 106  Resp: 16 16  16   Temp: 98.3 F (36.8 C) 99.5 F (37.5 C)  98.9 F (37.2 C)  TempSrc: Oral Oral  Oral  SpO2: 99% 95% 96% 95%  Weight:      Height:        Intake/Output  Summary (Last 24 hours) at 12/01/16 1401 Last data filed at 12/01/16 1300  Gross per 24 hour  Intake          1824.17 ml  Output             1650 ml  Net           174.17 ml   Filed Weights   11/27/16 1146 11/27/16 1845  Weight: 58.5 kg (129 lb) 59.8 kg (131 lb 13.4 oz)    Examination:  General exam: Appears calm and comfortable  Respiratory system: Clear to auscultation. Respiratory effort normal. Cardiovascular system: S1 & S2 heard, RRR. No JVD, murmurs, rubs, gallops or clicks. No pedal edema. Gastrointestinal system: Abdomen is nondistended, soft and nontender. No organomegaly or masses felt. Normal bowel sounds heard. Central nervous system: Alert and oriented. No focal neurological deficits. Extremities:  Symmetric 5 x 5 power. Skin: No rashes, lesions or ulcers Psychiatry: Judgement and insight appear normal. Mood & affect appropriate.   Data Reviewed: I have personally reviewed following labs and imaging studies  CBC:  Recent Labs Lab 11/26/16 1231  11/27/16 1545 11/28/16 0711 11/29/16 0744 11/30/16 0620 12/01/16 0628  WBC 4.8  < > 8.0 8.5 9.4 7.2 7.5  NEUTROABS 2.6  --  5.6  --   --   --   --   HGB 9.7*  < > 9.5* 7.8* 7.8* 8.0* 7.6*  HCT 28.1*  < > 27.9* 22.9* 23.4* 23.0* 22.6*  MCV 84  < > 87.5 87.4 88.3 86.5 86.6  PLT 293  < > 272 258 282 304 330  < > = values in this interval not displayed. Basic Metabolic Panel:  Recent Labs Lab 11/27/16 1215 11/28/16 0711 11/29/16 0744 11/30/16 0620 12/01/16 0628  NA 133* 138 136 131* 134*  K 4.0 3.4* 4.1 3.9 3.8  CL 95* 106 105 98* 99*  CO2 26 24 25 25 26   GLUCOSE 109* 114* 101* 112* 105*  BUN 13 9 6 9 11   CREATININE 0.91 0.85 0.69 0.78 0.74  CALCIUM 9.0 8.3* 8.2* 8.6* 8.6*   GFR: Coagulation Profile:  Recent Labs Lab 11/27/16 2008 11/28/16 0711 11/29/16 0744 11/30/16 0620 12/01/16 0628  INR 3.03 2.83 3.32 3.50 3.70   Cardiac Enzymes:  Recent Labs Lab 11/27/16 1332  TROPONINI <0.03   CBG:  Recent Labs Lab 11/27/16 1156 11/27/16 1210  GLUCAP 77 78   Anemia Panel:  Recent Labs  11/29/16 1042  VITAMINB12 558  FOLATE 11.0  FERRITIN 1,164*  TIBC 165*  IRON 15*  RETICCTPCT 2.0   Sepsis Labs:  Recent Labs Lab 11/27/16 1332 11/27/16 1545 11/27/16 2008  PROCALCITON  --   --  0.36  LATICACIDVEN 1.8 1.5  --     Recent Results (from the past 240 hour(s))  Urine Culture     Status: None   Collection Time: 11/27/16  3:35 PM  Result Value Ref Range Status   Specimen Description URINE, CLEAN CATCH  Final   Special Requests NONE  Final   Culture   Final    NO GROWTH Performed at [facility], [address] [place], [place] [address]    Report Status 11/29/2016 FINAL  Final  Blood  Culture (routine x 2)     Status: None (Preliminary result)   Collection Time: 11/27/16  3:46 PM  Result Value Ref Range Status   Specimen Description BLOOD  Final   Special Requests NONE  Final   Culture NO  GROWTH 4 DAYS  Final   Report Status PENDING  Incomplete  Blood Culture (routine x 2)     Status: None (Preliminary result)   Collection Time: 11/27/16  3:48 PM  Result Value Ref Range Status   Specimen Description BLOOD  Final   Special Requests NONE  Final   Culture NO GROWTH 4 DAYS  Final   Report Status PENDING  Incomplete     Radiology Studies: No results found.  Scheduled Meds: . amiodarone  200 mg Oral Daily  . aspirin EC  81 mg Oral Daily  . atorvastatin  40 mg Oral q1800  . busPIRone  15 mg Oral BID  . cyanocobalamin  1,000 mcg Intramuscular Daily  . docusate sodium  100 mg Oral BID  . DULoxetine  60 mg Oral Daily  . gabapentin  100 mg Oral QHS  . pantoprazole  40 mg Oral Daily  . polyethylene glycol  17 g Oral BID  . sodium chloride flush  3 mL Intravenous Q12H  . Warfarin - Pharmacist Dosing Inpatient   Does not apply q1800   Continuous Infusions: . sodium chloride    . sodium chloride 50 mL/hr at 11/30/16 1510  . piperacillin-tazobactam (ZOSYN)  IV 3.375 g (12/01/16 1400)     LOS: 4 days   Baudelio Karnes, MD FACP Hospitalist.   If 7PM-7AM, please contact night-coverage [email or web address] Password [email or web address] 12/01/2016, 2:01 PM

## 2016-12-02 ENCOUNTER — Inpatient Hospital Stay (HOSPITAL_COMMUNITY): Payer: Medicare Other

## 2016-12-02 DIAGNOSIS — R410 Disorientation, unspecified: Secondary | ICD-10-CM

## 2016-12-02 LAB — TYPE AND SCREEN
ABO/RH(D): O POS
Antibody Screen: NEGATIVE
Unit division: 0

## 2016-12-02 LAB — BPAM RBC
Blood Product Expiration Date: 201809012359
ISSUE DATE / TIME: 201808011610
UNIT TYPE AND RH: 5100

## 2016-12-02 LAB — CULTURE, BLOOD (ROUTINE X 2)
CULTURE: NO GROWTH
Culture: NO GROWTH

## 2016-12-02 LAB — CBC
HEMATOCRIT: 26.6 % — AB (ref 36.0–46.0)
HEMOGLOBIN: 9.1 g/dL — AB (ref 12.0–15.0)
MCH: 29.4 pg (ref 26.0–34.0)
MCHC: 34.2 g/dL (ref 30.0–36.0)
MCV: 86.1 fL (ref 78.0–100.0)
Platelets: 315 10*3/uL (ref 150–400)
RBC: 3.09 MIL/uL — AB (ref 3.87–5.11)
RDW: 16.2 % — ABNORMAL HIGH (ref 11.5–15.5)
WBC: 6.4 10*3/uL (ref 4.0–10.5)

## 2016-12-02 LAB — PROTIME-INR
INR: 3.49
PROTHROMBIN TIME: 35.9 s — AB (ref 11.4–15.2)

## 2016-12-02 MED ORDER — ALBUTEROL SULFATE (2.5 MG/3ML) 0.083% IN NEBU
2.5000 mg | INHALATION_SOLUTION | Freq: Four times a day (QID) | RESPIRATORY_TRACT | Status: DC
  Administered 2016-12-02 – 2016-12-03 (×6): 2.5 mg via RESPIRATORY_TRACT
  Filled 2016-12-02 (×6): qty 3

## 2016-12-02 MED ORDER — LEVOFLOXACIN IN D5W 750 MG/150ML IV SOLN
750.0000 mg | INTRAVENOUS | Status: DC
  Administered 2016-12-02 – 2016-12-04 (×2): 750 mg via INTRAVENOUS
  Filled 2016-12-02 (×4): qty 150

## 2016-12-02 MED ORDER — PIPERACILLIN-TAZOBACTAM 3.375 G IVPB
3.3750 g | Freq: Three times a day (TID) | INTRAVENOUS | Status: DC
  Administered 2016-12-02 – 2016-12-06 (×12): 3.375 g via INTRAVENOUS
  Filled 2016-12-02 (×13): qty 50

## 2016-12-02 MED ORDER — BISACODYL 10 MG RE SUPP
10.0000 mg | Freq: Every day | RECTAL | Status: DC | PRN
Start: 2016-12-02 — End: 2016-12-06

## 2016-12-02 MED ORDER — ACETAMINOPHEN 650 MG RE SUPP: 650.0000 mg | Freq: Four times a day (QID) | RECTAL | Status: DC | PRN

## 2016-12-02 MED ORDER — ACETAMINOPHEN 325 MG PO TABS: 650.0000 mg | ORAL_TABLET | ORAL | Status: DC | PRN

## 2016-12-02 NOTE — Progress Notes (Signed)
[place] for Coumadin Indication: atrial fibrillation  No Known Allergies  Patient Measurements: Height: 5\' 2"  (157.5 cm) Weight: 131 lb 13.4 oz (59.8 kg) IBW/kg (Calculated) : 50.1  Vital Signs: Temp: 98.7 F (37.1 C) (08/02 0720) Temp Source: Oral (08/02 0720) BP: 148/77 (08/02 0720) Pulse Rate: 86 (08/02 0720)  Labs:  Recent Labs  11/30/16 0620 12/01/16 0628 12/01/16 2141 12/02/16 0527  HGB 8.0* 7.6* 9.3* 9.1*  HCT 23.0* 22.6* 26.6* 26.6*  PLT 304 330  --  315  LABPROT 36.0* 37.6*  --  35.9*  INR 3.50 3.70  --  3.49  CREATININE 0.78 0.74  --   --    Estimated Creatinine Clearance: 44.4 mL/min (by C-G formula based on SCr of 0.74 mg/dL).  Medical History: Past Medical History:  Diagnosis Date  . Alcohol abuse, in remission   . Anxiety   . Atrial fibrillation ([place])   . Depression   . Dyspnea    walking distances   . Dysrhythmia   . History of bronchitis   . HTN (hypertension)   . Mass of right lung 07/13/2016  . Osteopenia   . Squamous cell carcinoma of right lung ([place]) 07/13/2016   Medications:  Prescriptions Prior to Admission  Medication Sig Dispense Refill Last Dose  . amiodarone (PACERONE) 200 MG tablet Take 200 mg by mouth daily.    11/26/2016 at Unknown time  . aspirin EC 81 MG tablet Take 81 mg by mouth daily.   11/26/2016 at Unknown time  . atorvastatin (LIPITOR) 40 MG tablet TAKE 1 TABLET (40 MG TOTAL) BY MOUTH DAILY. FOR HYPERLIPIDEMIA 90 tablet 1 11/26/2016 at Unknown time  . busPIRone (BUSPAR) 15 MG tablet Take 1 tablet (15 mg total) by mouth 2 (two) times daily. 180 tablet 2 11/26/2016 at Unknown time  . Calcium Carb-Cholecalciferol (CALCIUM 500+D3) 500-400 MG-UNIT TABS Take 1 tablet by mouth 2 (two) times daily.   11/26/2016 at Unknown time  . docusate calcium (SURFAK) 240 MG capsule Take 1 capsule (240 mg total) by mouth daily as needed for mild constipation. 30 capsule 1 11/26/2016 at Unknown time  .  DULoxetine (CYMBALTA) 60 MG capsule Take 1 capsule (60 mg total) by mouth daily. 30 capsule 2 11/26/2016 at Unknown time  . fish oil-omega-3 fatty acids 1000 MG capsule Take 3 capsules (3 g total) by mouth daily. For hyperlipidemia. (Patient taking differently: Take 1 g by mouth 3 (three) times daily. For hyperlipidemia.) 30 capsule  11/26/2016 at Unknown time  . fluticasone (FLONASE) 50 MCG/ACT nasal spray Place 2 sprays into both nostrils 2 (two) times daily. Use as needed (Patient taking differently: Place 2 sprays into both nostrils 2 (two) times daily as needed for allergies. Use as needed) 16 g 6 11/26/2016 at Unknown time  . gabapentin (NEURONTIN) 100 MG capsule Take 1 capsule (100 mg total) by mouth at bedtime. 90 capsule 2 11/26/2016 at Unknown time  . lisinopril (PRINIVIL,ZESTRIL) 5 MG tablet Take 5 mg by mouth.   11/26/2016 at Unknown time  . meclizine (ANTIVERT) 25 MG tablet Take 25 mg by mouth 3 (three) times daily as needed for dizziness.   11/26/2016 at Unknown time  . nitroGLYCERIN (NITROSTAT) 0.4 MG SL tablet Place 0.4 mg under the tongue.   11/26/2016 at Unknown time  . omeprazole (PRILOSEC) 40 MG capsule Take 1 capsule (40 mg total) by mouth daily. 30 capsule 2 11/26/2016 at Unknown time  . warfarin (COUMADIN) 2.5 MG tablet Take  2.5 mg by mouth daily.    11/26/2016 at 0800   Assessment: 82 y.o. female with medical history significant of recent diagnosis of atrial fibrillation, on chronic anticoagulation. Lung cancer status post radiation and chemotherapy with admission 7/21-22 for probably vasovagal syncope. Patient reported feeling weak, light-headed, and dizzy with extreme fatigue. Admitted with sepsis. Pharmacy consulted to manage coumadin.  INR > 3, still elevated today.   Goal of Therapy:  INR 2-3 Monitor platelets by anticoagulation protocol: Yes   Plan:  HOLD coumadin today, allow INR to trend down PT-INR daily Monitor for S/S of bleeding  Hart Robinsons, PharmD Clinical  Pharmacist Pager:  [phone] 12/02/2016   12/02/2016,8:53 AM

## 2016-12-02 NOTE — Progress Notes (Signed)
PHARMACY NOTE:  ANTIMICROBIAL RENAL DOSAGE ADJUSTMENT  Current antimicrobial regimen includes a mismatch between antimicrobial dosage and estimated renal function.  As per policy approved by the Pharmacy & Therapeutics and Medical Executive Committees, the antimicrobial dosage will be adjusted accordingly.  Current antimicrobial dosage:  Levaquin 750 mg IV every 24 hours    Renal Function:  Estimated Creatinine Clearance: 44.4 mL/min (by C-G formula based on SCr of 0.74 mg/dL). []      On intermittent HD, scheduled: []      On CRRT    Antimicrobial dosage has been changed to:  Levaquin 750 mg IV every 48 hours  Additional comments:   Thank you for allowing pharmacy to be a part of this patient's care.  Gildardo Griffes [place], [facility] 12/02/2016 12:29 PM

## 2016-12-02 NOTE — Care Management Note (Signed)
Case Management Note  Patient Details  Name: ROSLIND MICHAUX MRN: [id number] Date of Birth: [birth year]  Status of Service:  In process, will continue to follow  If discussed at Long Length of Stay Meetings, dates discussed:  12/02/2016  Additional Comments: Plan was for patient to DC today. CXR now showing PNA.   Chantz Montefusco, Chauncey [place], RN 12/02/2016, 12:30 PM

## 2016-12-02 NOTE — Progress Notes (Signed)
LCSW continues to follow for disposition of needs:  SNF vs Home  Family continues to remain indecisive regarding discharge plans and very concerned that patient is not well. Patient current remains medically acute with PNA and not ready for discharge.  [facility] has been obtained:  [phone] [id number]   LCSW will present bed offers with family reporting they will not accept [facility] bed offer or [facility] and discussed with facility who declined patient.  Will follow up with choice home vs SNF.  Lane Hacker, MSW Clinical Social Work: [employer] Coverage for :  [phone]

## 2016-12-02 NOTE — Progress Notes (Signed)
PROGRESS NOTE    Alexandria Chen  ZHY:[id number] DOB: 01/10/1935 DOA: 11/27/2016 PCP: [name], FNP    Brief Narrative:  Alexandria Chen an 82 y.o.femalepast medical history significant for chronic atrial fibrillation on Coumadin, lung cancer status post radiation chemotherapy, discharge from the hospital on 11/21/2016 for vasovagal syncope. Since then she's been falling at home, she relates she is lightheaded. She denies any loss of consciousness. No head trauma. Assessment and ED with a temperature of 101. She was Tx for sepsis of unclear source. BC have remained negative. She was also found to have low BP and has been on low dose ACE I. It should be noted that her CT of the chest reviewed a new partially cavitary lesion within the posteromedial RLL (intermediate--cannot r/out disease progression.)  She continued to feel better and Cultures remained negative.  She had PT, and PT suggested SNF placement.  Family and patient is agreeable. She developed some confusion, felt to be due to sundowning, and CT head was negative.  She was given Ativan.  CXR this morning showed small "developping PNA" in the LUL.   Assessment & Plan:   Principal Problem:   Syncope and collapse Active Problems:   Essential hypertension, benign   Squamous cell carcinoma of right lung (HCC)   Atrial fibrillation (HCC) [I48.91]   Sepsis ([place])   Anemia, unspecified   Warfarin anticoagulation  1/ Possible sepsis: She was started at first on IV Van/Zosyn, and Lucianne Lei was discontinued today as cultures remained negative. She is now afebrile.  2/ Syncope: Vasovagal possible. But her BP was too low and it is possible she is over Tx with BP meds. have d/c lisinopril and IVF given.  Would allow higher BP outpatient before initiate any Tx. 3/ Lung CA: She will follow up with oncology. 4/ Afib: Will continue with coumadin per pharmacy dosing.   5/  Anemia:  Suspect dilution and plebotomy.  She was  transfuse 1 unit of PRBC and Hb is 9.1 g [er dL.  6/  PNA:  Will continue with IV Zosyn, hold discharge, and add Levoquin to her regimen. She had some wheezing, so will give neb Tx.    DVT prophylaxis: Coumadin.  Code Status: FULL CODE.  Family Communication: 3 sister Wilmer Floor, and Florence-Graham.  Son now is at bedside.  Disposition Plan: SNF.   Consultants:   None.   Procedures:   Transfusion.   Antimicrobials: Anti-infectives    Start     Dose/Rate Route Frequency Ordered Stop   12/02/16 1400  piperacillin-tazobactam (ZOSYN) IVPB 3.375 g     3.375 g 12.5 mL/hr over 240 Minutes Intravenous Every 8 hours 12/02/16 1230     12/02/16 1300  levofloxacin (LEVAQUIN) IVPB 750 mg     750 mg 100 mL/hr over 90 Minutes Intravenous Every 48 hours 12/02/16 1227     11/28/16 1600  vancomycin (VANCOCIN) IVPB 1000 mg/200 mL premix  Status:  Discontinued     1,000 mg 200 mL/hr over 60 Minutes Intravenous Every 24 hours 11/27/16 1906 12/01/16 0840   11/27/16 2200  piperacillin-tazobactam (ZOSYN) IVPB 3.375 g  Status:  Discontinued     3.375 g 12.5 mL/hr over 240 Minutes Intravenous Every 8 hours 11/27/16 1906 12/02/16 0937   11/27/16 1545  vancomycin (VANCOCIN) IVPB 1000 mg/200 mL premix     1,000 mg 200 mL/hr over 60 Minutes Intravenous  Once 11/27/16 1534 11/28/16 0518   11/27/16 1545  piperacillin-tazobactam (ZOSYN) IVPB 3.375 g  3.375 g 100 mL/hr over 30 Minutes Intravenous  Once 11/27/16 1534 11/28/16 0519       Subjective:  Mild HA.  Objective: Vitals:   12/01/16 1641 12/01/16 1903 12/01/16 2027 12/02/16 0720  BP: (!) 141/76 (!) 176/101 (!) 148/58 (!) 148/77  Pulse: (!) 102 (!) 103 (!) 118 86  Resp: 18 18 20 20   Temp: 98.3 F (36.8 C) 99.1 F (37.3 C) 99.4 F (37.4 C) 98.7 F (37.1 C)  TempSrc: Oral Oral Oral Oral  SpO2: 97% 93% 98% 95%  Weight:      Height:        Intake/Output Summary (Last 24 hours) at 12/02/16 1230 Last data filed at 12/01/16 1903  Gross per  24 hour  Intake             1839 ml  Output                0 ml  Net             1839 ml   Filed Weights   11/27/16 1146 11/27/16 1845  Weight: 58.5 kg (129 lb) 59.8 kg (131 lb 13.4 oz)    Examination:  General exam: Appears calm and comfortable  Respiratory system: No rales, some wheezing.  Cardiovascular system: S1 & S2 heard, RRR. No JVD, murmurs, rubs, gallops or clicks. No pedal edema. Gastrointestinal system: Abdomen is nondistended, soft and nontender. No organomegaly or masses felt. Normal bowel sounds heard. Central nervous system: Alert and oriented. No focal neurological deficits. Extremities: Symmetric 5 x 5 power. Skin: No rashes, lesions or ulcers Psychiatry: Judgement and insight appear normal. Mood & affect appropriate.   Data Reviewed: I have personally reviewed following labs and imaging studies  CBC:  Recent Labs Lab 11/26/16 1231  11/27/16 1545 11/28/16 0711 11/29/16 0744 11/30/16 0620 12/01/16 0628 12/01/16 2141 12/02/16 0527  WBC 4.8  < > 8.0 8.5 9.4 7.2 7.5  --  6.4  NEUTROABS 2.6  --  5.6  --   --   --   --   --   --   HGB 9.7*  < > 9.5* 7.8* 7.8* 8.0* 7.6* 9.3* 9.1*  HCT 28.1*  < > 27.9* 22.9* 23.4* 23.0* 22.6* 26.6* 26.6*  MCV 84  < > 87.5 87.4 88.3 86.5 86.6  --  86.1  PLT 293  < > 272 258 282 304 330  --  315  < > = values in this interval not displayed. Basic Metabolic Panel:  Recent Labs Lab 11/27/16 1215 11/28/16 0711 11/29/16 0744 11/30/16 0620 12/01/16 0628  NA 133* 138 136 131* 134*  K 4.0 3.4* 4.1 3.9 3.8  CL 95* 106 105 98* 99*  CO2 26 24 25 25 26   GLUCOSE 109* 114* 101* 112* 105*  BUN 13 9 6 9 11   CREATININE 0.91 0.85 0.69 0.78 0.74  CALCIUM 9.0 8.3* 8.2* 8.6* 8.6*   GFR: Coagulation Profile:  Recent Labs Lab 11/28/16 0711 11/29/16 0744 11/30/16 0620 12/01/16 0628 12/02/16 0527  INR 2.83 3.32 3.50 3.70 3.49   Cardiac Enzymes:  Recent Labs Lab 11/27/16 1332  TROPONINI <0.03   CBG:  Recent Labs Lab  11/27/16 1156 11/27/16 1210 12/01/16 2027  GLUCAP 77 78 122*   Sepsis Labs:  Recent Labs Lab 11/27/16 1332 11/27/16 1545 11/27/16 2008  PROCALCITON  --   --  0.36  LATICACIDVEN 1.8 1.5  --     Recent Results (from the past 240 hour(s))  Urine  Culture     Status: None   Collection Time: 11/27/16  3:35 PM  Result Value Ref Range Status   Specimen Description URINE, CLEAN CATCH  Final   Special Requests NONE  Final   Culture   Final    NO GROWTH Performed at [facility], [address], [place], [place] [address]    Report Status 11/29/2016 FINAL  Final  Blood Culture (routine x 2)     Status: None   Collection Time: 11/27/16  3:46 PM  Result Value Ref Range Status   Specimen Description BLOOD  Final   Special Requests NONE  Final   Culture NO GROWTH 5 DAYS  Final   Report Status 12/02/2016 FINAL  Final  Blood Culture (routine x 2)     Status: None   Collection Time: 11/27/16  3:48 PM  Result Value Ref Range Status   Specimen Description BLOOD  Final   Special Requests NONE  Final   Culture NO GROWTH 5 DAYS  Final   Report Status 12/02/2016 FINAL  Final     Radiology Studies: Dg Chest 2 View  Result Date: 12/02/2016 CLINICAL DATA:  Cough EXAM: CHEST  2 VIEW COMPARISON:  11/20/2016 FINDINGS: Low lung volumes. Right Port-A-Cath remains in place, unchanged. Heart is borderline in size, accentuated by the low volumes and AP nature of the study. Patchy airspace disease in the left upper lobe is new since prior study. No confluent opacity on the right. No visible effusions or acute bony abnormality. IMPRESSION: New patchy left upper lobe airspace disease concerning for pneumonia. Low lung volumes. Electronically Signed   By: Rolm Baptise M.D.   On: 12/02/2016 11:37   Ct Head Wo Contrast  Result Date: 12/02/2016 CLINICAL DATA:  Increased altered mental status since 12/01/2016. Confusion. EXAM: CT HEAD WITHOUT CONTRAST TECHNIQUE: Contiguous axial images were obtained from  the base of the skull through the vertex without intravenous contrast. COMPARISON:  11/27/2016 FINDINGS: Brain: Bilateral cerebral atrophy. Low-attenuation changes throughout the deep white matter consistent with small vessel ischemia. No evidence of acute infarction, hemorrhage, hydrocephalus, extra-axial collection or mass lesion/mass effect. Vascular: Vascular calcifications are present in the internal carotid arteries. Skull: No depressed skull fractures. Sinuses/Orbits: Paranasal sinuses and mastoid air cells are clear. Other: No significant change since previous study. IMPRESSION: No acute intracranial abnormalities. Chronic atrophy and small vessel ischemic changes. No change since previous study. Electronically Signed   By: Lucienne Capers M.D.   On: 12/02/2016 01:31    Scheduled Meds: . albuterol  2.5 mg Nebulization Q6H  . amiodarone  200 mg Oral Daily  . aspirin EC  81 mg Oral Daily  . atorvastatin  40 mg Oral q1800  . busPIRone  15 mg Oral BID  . cyanocobalamin  1,000 mcg Intramuscular Daily  . docusate sodium  100 mg Oral BID  . DULoxetine  60 mg Oral Daily  . gabapentin  100 mg Oral QHS  . pantoprazole  40 mg Oral Daily  . polyethylene glycol  17 g Oral BID  . sodium chloride flush  3 mL Intravenous Q12H  . Warfarin - Pharmacist Dosing Inpatient   Does not apply q1800   Continuous Infusions: . sodium chloride    . levofloxacin (LEVAQUIN) IV    . piperacillin-tazobactam (ZOSYN)  IV       LOS: 5 days   Alisse Tuite, MD FACP Hospitalist.   If 7PM-7AM, please contact night-coverage [email or web address] Password [email or web address] 12/02/2016, 12:30 PM

## 2016-12-02 NOTE — Progress Notes (Signed)
Pharmacy Antibiotic Note  Alexandria Chen is a 82 y.o. female admitted on 11/27/2016 with sepsis.  Pharmacy has been consulted for Zosyn dosing.  Patient is now afebrile. Cultures are negative to date   Plan: Continue Zosyn 3.375g IV q8h (4 hour infusion). F/U cxs and clinical progress Monitor V/S, labs, and levels as indicated  Height: 5\' 2"  (157.5 cm) Weight: 131 lb 13.4 oz (59.8 kg) IBW/kg (Calculated) : 50.1  Temp (24hrs), Avg:98.7 F (37.1 C), Min:98 F (36.7 C), Max:99.4 F (37.4 C)   Recent Labs Lab 11/27/16 1215 11/27/16 1332 11/27/16 1545 11/28/16 0711 11/29/16 0744 11/30/16 0620 12/01/16 0628 12/02/16 0527  WBC 7.0  --  8.0 8.5 9.4 7.2 7.5 6.4  CREATININE 0.91  --   --  0.85 0.69 0.78 0.74  --   LATICACIDVEN  --  1.8 1.5  --   --   --   --   --     Estimated Creatinine Clearance: 44.4 mL/min (by C-G formula based on SCr of 0.74 mg/dL).    No Known Allergies  Antimicrobials this admission: Vancomycin 7/28 >> 8/1 Zosyn 7/28 >> Levaquin 8/2 >>  Dose adjustments this admission: N/A  Microbiology results: 7/28 BCx: ngtd 7/28 UCx: no growth  Thank you for allowing pharmacy to be a part of this patient's care.  Hart Robinsons, PharmD Clinical Pharmacist Pager:  [phone] 12/02/2016   12/02/2016 12:30 PM

## 2016-12-03 ENCOUNTER — Telehealth: Payer: Self-pay | Admitting: *Deleted

## 2016-12-03 LAB — PROTIME-INR
INR: 3.29
PROTHROMBIN TIME: 34.2 s — AB (ref 11.4–15.2)

## 2016-12-03 LAB — BASIC METABOLIC PANEL
ANION GAP: 12 (ref 5–15)
BUN: 8 mg/dL (ref 6–20)
CALCIUM: 8.8 mg/dL — AB (ref 8.9–10.3)
CO2: 28 mmol/L (ref 22–32)
Chloride: 97 mmol/L — ABNORMAL LOW (ref 101–111)
Creatinine, Ser: 0.74 mg/dL (ref 0.44–1.00)
GFR calc non Af Amer: >60 mL/min (ref >60)
Glucose, Bld: 115 mg/dL — ABNORMAL HIGH (ref 65–99)
POTASSIUM: 3.4 mmol/L — AB (ref 3.5–5.1)
Sodium: 137 mmol/L (ref 135–145)

## 2016-12-03 NOTE — Telephone Encounter (Signed)
Patients daughter called and states patient is back in the hospital due to syncopal episodes and also has pneumonia. Patients daughter states that they are D/C her lisinopril because her BP has been dropping when they check her orthostatic blood pressures. Patients daughter just wanted your input. Patients daughter advised that she should follow the hospitals recommendation because they are monitoring her BP closely and once she is discharged if her Bp starts to increase she can follow up and decided if she needs to go back on any BP medication.

## 2016-12-03 NOTE — Progress Notes (Signed)
ANTICOAGULATION CONSULT NOTE   Pharmacy Consult for Coumadin Indication: atrial fibrillation  No Known Allergies  Patient Measurements: Height: 5\' 2"  (157.5 cm) Weight: 131 lb 13.4 oz (59.8 kg) IBW/kg (Calculated) : 50.1  Vital Signs: Temp: 98.4 F (36.9 C) (08/03 0541) Temp Source: Oral (08/03 0541) BP: 144/89 (08/03 0541) Pulse Rate: 92 (08/03 0541)  Labs:  Recent Labs  12/01/16 0628 12/01/16 2141 12/02/16 0527 12/03/16 0645  HGB 7.6* 9.3* 9.1*  --   HCT 22.6* 26.6* 26.6*  --   PLT 330  --  315  --   LABPROT 37.6*  --  35.9* 34.2*  INR 3.70  --  3.49 3.29  CREATININE 0.74  --   --  0.74   Estimated Creatinine Clearance: 44.4 mL/min (by C-G formula based on SCr of 0.74 mg/dL).  Medical History: Past Medical History:  Diagnosis Date  . Alcohol abuse, in remission   . Anxiety   . Atrial fibrillation ([place])   . Depression   . Dyspnea    walking distances   . Dysrhythmia   . History of bronchitis   . HTN (hypertension)   . Mass of right lung 07/13/2016  . Osteopenia   . Squamous cell carcinoma of right lung ([place]) 07/13/2016   Medications:  Prescriptions Prior to Admission  Medication Sig Dispense Refill Last Dose  . amiodarone (PACERONE) 200 MG tablet Take 200 mg by mouth daily.    11/26/2016 at Unknown time  . aspirin EC 81 MG tablet Take 81 mg by mouth daily.   11/26/2016 at Unknown time  . atorvastatin (LIPITOR) 40 MG tablet TAKE 1 TABLET (40 MG TOTAL) BY MOUTH DAILY. FOR HYPERLIPIDEMIA 90 tablet 1 11/26/2016 at Unknown time  . busPIRone (BUSPAR) 15 MG tablet Take 1 tablet (15 mg total) by mouth 2 (two) times daily. 180 tablet 2 11/26/2016 at Unknown time  . Calcium Carb-Cholecalciferol (CALCIUM 500+D3) 500-400 MG-UNIT TABS Take 1 tablet by mouth 2 (two) times daily.   11/26/2016 at Unknown time  . docusate calcium (SURFAK) 240 MG capsule Take 1 capsule (240 mg total) by mouth daily as needed for mild constipation. 30 capsule 1 11/26/2016 at Unknown time  .  DULoxetine (CYMBALTA) 60 MG capsule Take 1 capsule (60 mg total) by mouth daily. 30 capsule 2 11/26/2016 at Unknown time  . fish oil-omega-3 fatty acids 1000 MG capsule Take 3 capsules (3 g total) by mouth daily. For hyperlipidemia. (Patient taking differently: Take 1 g by mouth 3 (three) times daily. For hyperlipidemia.) 30 capsule  11/26/2016 at Unknown time  . fluticasone (FLONASE) 50 MCG/ACT nasal spray Place 2 sprays into both nostrils 2 (two) times daily. Use as needed (Patient taking differently: Place 2 sprays into both nostrils 2 (two) times daily as needed for allergies. Use as needed) 16 g 6 11/26/2016 at Unknown time  . gabapentin (NEURONTIN) 100 MG capsule Take 1 capsule (100 mg total) by mouth at bedtime. 90 capsule 2 11/26/2016 at Unknown time  . lisinopril (PRINIVIL,ZESTRIL) 5 MG tablet Take 5 mg by mouth.   11/26/2016 at Unknown time  . meclizine (ANTIVERT) 25 MG tablet Take 25 mg by mouth 3 (three) times daily as needed for dizziness.   11/26/2016 at Unknown time  . nitroGLYCERIN (NITROSTAT) 0.4 MG SL tablet Place 0.4 mg under the tongue.   11/26/2016 at Unknown time  . omeprazole (PRILOSEC) 40 MG capsule Take 1 capsule (40 mg total) by mouth daily. 30 capsule 2 11/26/2016 at Unknown time  .  warfarin (COUMADIN) 2.5 MG tablet Take 2.5 mg by mouth daily.    11/26/2016 at 0800   Assessment: 82 y.o. female with medical history significant of recent diagnosis of atrial fibrillation, on chronic anticoagulation. Lung cancer status post radiation and chemotherapy with admission 7/21-22 for probably vasovagal syncope. Patient reported feeling weak, light-headed, and dizzy with extreme fatigue. Admitted with sepsis. Pharmacy consulted to manage coumadin.  INR > 3, still elevated today.   Goal of Therapy:  INR 2-3 Monitor platelets by anticoagulation protocol: Yes   Plan:  HOLD coumadin today, allow INR to trend down PT-INR daily Monitor for S/S of bleeding  Excell Seltzer, PharmD Clinical  Pharmacist 12/03/2016,10:20 AM

## 2016-12-03 NOTE — Progress Notes (Signed)
PROGRESS NOTE    Alexandria Chen  AYT:[id number] DOB: 1934/12/26 DOA: 11/27/2016 PCP: [name], FNP    Brief Narrative: Alexandria Chen an 82 y.o.femalepast medical history significant for chronic atrial fibrillation on Coumadin, lung cancer status post radiation chemotherapy, discharge from the hospital on 11/21/2016 for vasovagal syncope. Since then she's been falling at home, she relates she is lightheaded. She denies any loss of consciousness. No head trauma. Assessment and ED with a temperature of 101. She was Tx for sepsis of unclear source. BC have remained negative. She was also found to have low BP and has been on low dose ACE I. It should be noted that her CT of the chest reviewed a new partially cavitary lesion within the posteromedial RLL (intermediate--cannot r/out disease progression.) She continued to feel better and Cultures remained negative. She had PT, and PT suggested SNF placement. Family and patient is agreeable. She developed some confusion, felt to be due to sundowning, and CT head was negative.  She was given Ativan x 1.  CXR  12/02/16 showed small "developping PNA" in the LUL, and therefore, she was placed back on Zosyn and added IV Levaquin.  She is feeling better today.     Assessment & Plan:   Principal Problem:   Syncope and collapse Active Problems:   Essential hypertension, benign   Squamous cell carcinoma of right lung (HCC)   Atrial fibrillation (HCC) [I48.91]   Sepsis ([place])   Anemia, unspecified   Warfarin anticoagulation  1/ Possible sepsis: She was started at first on IV Van/Zosyn, and Lucianne Lei was discontinued as cultures remained negative. She is now afebrile.  2/ Syncope: Vasovagal possible. But her BP was too low and it is possible she is over Tx with BP meds. have d/c lisinopril and IVF given, and now off IVF as well. Would allow higher BP outpatient before initiate any Tx. 3/ Lung CA: She will follow up with oncology. 4/ Afib:  Will continue with coumadin per pharmacy dosing.  5/ Anemia: Suspect dilution and plebotomy. She was transfuse 1 unit of PRBC and Hb rose to 9.1 g [er dL.  6/  PNA:  Will continue with IV Zosyn, hold discharge, and add Levoquin to her regimen. She had some wheezing, but responded nicely to neb Tx.     DVT prophylaxis: Coumadin.  Code Status: FULL CODE.  Family Communication: 3 sister Wilmer Floor, and [place].  Son now is at bedside.  I discussed with her and her family about having a designated Media planner.  Disposition Plan: SNF.   Antimicrobials: Anti-infectives    Start     Dose/Rate Route Frequency Ordered Stop   12/02/16 1400  piperacillin-tazobactam (ZOSYN) IVPB 3.375 g     3.375 g 12.5 mL/hr over 240 Minutes Intravenous Every 8 hours 12/02/16 1230     12/02/16 1300  levofloxacin (LEVAQUIN) IVPB 750 mg     750 mg 100 mL/hr over 90 Minutes Intravenous Every 48 hours 12/02/16 1227     11/28/16 1600  vancomycin (VANCOCIN) IVPB 1000 mg/200 mL premix  Status:  Discontinued     1,000 mg 200 mL/hr over 60 Minutes Intravenous Every 24 hours 11/27/16 1906 12/01/16 0840   11/27/16 2200  piperacillin-tazobactam (ZOSYN) IVPB 3.375 g  Status:  Discontinued     3.375 g 12.5 mL/hr over 240 Minutes Intravenous Every 8 hours 11/27/16 1906 12/02/16 0937   11/27/16 1545  vancomycin (VANCOCIN) IVPB 1000 mg/200 mL premix     1,000 mg  200 mL/hr over 60 Minutes Intravenous  Once 11/27/16 1534 11/28/16 0518   11/27/16 1545  piperacillin-tazobactam (ZOSYN) IVPB 3.375 g     3.375 g 100 mL/hr over 30 Minutes Intravenous  Once 11/27/16 1534 11/28/16 0519       Subjective:  " I feel better today"  Objective: Vitals:   12/02/16 2124 12/03/16 0138 12/03/16 0541 12/03/16 0813  BP: 110/79  (!) 144/89   Pulse: (!) 107  92   Resp: 20  20   Temp: 97.8 F (36.6 C)  98.4 F (36.9 C)   TempSrc: Oral  Oral   SpO2: 99% 92% 93% 94%  Weight:      Height:        Intake/Output Summary (Last  24 hours) at 12/03/16 1146 Last data filed at 12/03/16 0304  Gross per 24 hour  Intake              250 ml  Output                0 ml  Net              250 ml   Filed Weights   11/27/16 1146 11/27/16 1845  Weight: 58.5 kg (129 lb) 59.8 kg (131 lb 13.4 oz)    Examination:  General exam: Appears calm and comfortable  Respiratory system: Clear to auscultation. Respiratory effort normal. Cardiovascular system: S1 & S2 heard, RRR. No JVD, murmurs, rubs, gallops or clicks. No pedal edema. Gastrointestinal system: Abdomen is nondistended, soft and nontender. No organomegaly or masses felt. Normal bowel sounds heard. Central nervous system: Alert and oriented. No focal neurological deficits. Extremities: Symmetric 5 x 5 power. Skin: No rashes, lesions or ulcers Psychiatry: Judgement and insight appear normal. Mood & affect appropriate.   Data Reviewed: I have personally reviewed following labs and imaging studies  CBC:  Recent Labs Lab 11/26/16 1231  11/27/16 1545 11/28/16 0711 11/29/16 0744 11/30/16 0620 12/01/16 0628 12/01/16 2141 12/02/16 0527  WBC 4.8  < > 8.0 8.5 9.4 7.2 7.5  --  6.4  NEUTROABS 2.6  --  5.6  --   --   --   --   --   --   HGB 9.7*  < > 9.5* 7.8* 7.8* 8.0* 7.6* 9.3* 9.1*  HCT 28.1*  < > 27.9* 22.9* 23.4* 23.0* 22.6* 26.6* 26.6*  MCV 84  < > 87.5 87.4 88.3 86.5 86.6  --  86.1  PLT 293  < > 272 258 282 304 330  --  315  < > = values in this interval not displayed. Basic Metabolic Panel:  Recent Labs Lab 11/28/16 0711 11/29/16 0744 11/30/16 0620 12/01/16 0628 12/03/16 0645  NA 138 136 131* 134* 137  K 3.4* 4.1 3.9 3.8 3.4*  CL 106 105 98* 99* 97*  CO2 24 25 25 26 28   GLUCOSE 114* 101* 112* 105* 115*  BUN 9 6 9 11 8   CREATININE 0.85 0.69 0.78 0.74 0.74  CALCIUM 8.3* 8.2* 8.6* 8.6* 8.8*   GFR: Coagulation Profile:  Recent Labs Lab 11/29/16 0744 11/30/16 0620 12/01/16 0628 12/02/16 0527 12/03/16 0645  INR 3.32 3.50 3.70 3.49 3.29    Cardiac Enzymes:  Recent Labs Lab 11/27/16 1332  TROPONINI <0.03   BNP (last 3 results) CBG:  Recent Labs Lab 11/27/16 1156 11/27/16 1210 12/01/16 2027  GLUCAP 77 78 122*   Lipid Profile: Sepsis Labs:  Recent Labs Lab 11/27/16 1332 11/27/16 1545 11/27/16 2008  PROCALCITON  --   --  0.36  LATICACIDVEN 1.8 1.5  --     Recent Results (from the past 240 hour(s))  Urine Culture     Status: None   Collection Time: 11/27/16  3:35 PM  Result Value Ref Range Status   Specimen Description URINE, CLEAN CATCH  Final   Special Requests NONE  Final   Culture   Final    NO GROWTH Performed at [facility], [address], [place], [place] [address]    Report Status 11/29/2016 FINAL  Final  Blood Culture (routine x 2)     Status: None   Collection Time: 11/27/16  3:46 PM  Result Value Ref Range Status   Specimen Description BLOOD  Final   Special Requests NONE  Final   Culture NO GROWTH 5 DAYS  Final   Report Status 12/02/2016 FINAL  Final  Blood Culture (routine x 2)     Status: None   Collection Time: 11/27/16  3:48 PM  Result Value Ref Range Status   Specimen Description BLOOD  Final   Special Requests NONE  Final   Culture NO GROWTH 5 DAYS  Final   Report Status 12/02/2016 FINAL  Final     Radiology Studies: Dg Chest 2 View  Result Date: 12/02/2016 CLINICAL DATA:  Cough EXAM: CHEST  2 VIEW COMPARISON:  11/20/2016 FINDINGS: Low lung volumes. Right Port-A-Cath remains in place, unchanged. Heart is borderline in size, accentuated by the low volumes and AP nature of the study. Patchy airspace disease in the left upper lobe is new since prior study. No confluent opacity on the right. No visible effusions or acute bony abnormality. IMPRESSION: New patchy left upper lobe airspace disease concerning for pneumonia. Low lung volumes. Electronically Signed   By: Rolm Baptise M.D.   On: 12/02/2016 11:37   Ct Head Wo Contrast  Result Date: 12/02/2016 CLINICAL DATA:   Increased altered mental status since 12/01/2016. Confusion. EXAM: CT HEAD WITHOUT CONTRAST TECHNIQUE: Contiguous axial images were obtained from the base of the skull through the vertex without intravenous contrast. COMPARISON:  11/27/2016 FINDINGS: Brain: Bilateral cerebral atrophy. Low-attenuation changes throughout the deep white matter consistent with small vessel ischemia. No evidence of acute infarction, hemorrhage, hydrocephalus, extra-axial collection or mass lesion/mass effect. Vascular: Vascular calcifications are present in the internal carotid arteries. Skull: No depressed skull fractures. Sinuses/Orbits: Paranasal sinuses and mastoid air cells are clear. Other: No significant change since previous study. IMPRESSION: No acute intracranial abnormalities. Chronic atrophy and small vessel ischemic changes. No change since previous study. Electronically Signed   By: Lucienne Capers M.D.   On: 12/02/2016 01:31    Scheduled Meds: . albuterol  2.5 mg Nebulization Q6H  . amiodarone  200 mg Oral Daily  . aspirin EC  81 mg Oral Daily  . atorvastatin  40 mg Oral q1800  . busPIRone  15 mg Oral BID  . cyanocobalamin  1,000 mcg Intramuscular Daily  . docusate sodium  100 mg Oral BID  . DULoxetine  60 mg Oral Daily  . gabapentin  100 mg Oral QHS  . pantoprazole  40 mg Oral Daily  . polyethylene glycol  17 g Oral BID  . sodium chloride flush  3 mL Intravenous Q12H  . Warfarin - Pharmacist Dosing Inpatient   Does not apply q1800   Continuous Infusions: . sodium chloride    . levofloxacin (LEVAQUIN) IV Stopped (12/02/16 1734)  . piperacillin-tazobactam (ZOSYN)  IV Stopped (12/03/16 9528)  LOS: 6 days   Roni Scow, MD [facility].   If 7PM-7AM, please contact night-coverage [email or web address] Password [email or web address] 12/03/2016, 11:46 AM

## 2016-12-03 NOTE — Progress Notes (Signed)
Patient has small stool, unable to collect for Hemoccult due to contamination.

## 2016-12-03 NOTE — Care Management Important Message (Signed)
Important Message  Patient Details  Name: MAHUM BETTEN MRN: [id number] Date of Birth: 1934-10-01   Medicare Important Message Given:  Yes    Gaylin Bulthuis, Chauncey Reading, RN 12/03/2016, 11:50 AM

## 2016-12-04 LAB — PROTIME-INR
INR: 2.67
PROTHROMBIN TIME: 29 s — AB (ref 11.4–15.2)

## 2016-12-04 MED ORDER — ALBUTEROL SULFATE (2.5 MG/3ML) 0.083% IN NEBU
2.5000 mg | INHALATION_SOLUTION | Freq: Four times a day (QID) | RESPIRATORY_TRACT | Status: DC
  Administered 2016-12-04 – 2016-12-06 (×5): 2.5 mg via RESPIRATORY_TRACT
  Filled 2016-12-04 (×7): qty 3

## 2016-12-04 MED ORDER — WARFARIN SODIUM 2 MG PO TABS
2.0000 mg | ORAL_TABLET | Freq: Once | ORAL | Status: AC
  Administered 2016-12-04: 2 mg via ORAL
  Filled 2016-12-04: qty 1

## 2016-12-04 NOTE — Progress Notes (Signed)
ANTICOAGULATION CONSULT NOTE   Pharmacy Consult for Coumadin Indication: atrial fibrillation  No Known Allergies  Patient Measurements: Height: 5\' 2"  (157.5 cm) Weight: 131 lb 13.4 oz (59.8 kg) IBW/kg (Calculated) : 50.1  Vital Signs: Temp: 98.4 F (36.9 C) (08/04 0552) Temp Source: Oral (08/04 0552) BP: 155/86 (08/04 0552) Pulse Rate: 91 (08/04 0552)  Labs:  Recent Labs  12/01/16 2141 12/02/16 0527 12/03/16 0645 12/04/16 0557  HGB 9.3* 9.1*  --   --   HCT 26.6* 26.6*  --   --   PLT  --  315  --   --   LABPROT  --  35.9* 34.2* 29.0*  INR  --  3.49 3.29 2.67  CREATININE  --   --  0.74  --    Estimated Creatinine Clearance: 44.4 mL/min (by C-G formula based on SCr of 0.74 mg/dL).  Medical History: Past Medical History:  Diagnosis Date  . Alcohol abuse, in remission   . Anxiety   . Atrial fibrillation ([place])   . Depression   . Dyspnea    walking distances   . Dysrhythmia   . History of bronchitis   . HTN (hypertension)   . Mass of right lung 07/13/2016  . Osteopenia   . Squamous cell carcinoma of right lung ([place]) 07/13/2016   Medications:  Prescriptions Prior to Admission  Medication Sig Dispense Refill Last Dose  . amiodarone (PACERONE) 200 MG tablet Take 200 mg by mouth daily.    11/26/2016 at Unknown time  . aspirin EC 81 MG tablet Take 81 mg by mouth daily.   11/26/2016 at Unknown time  . atorvastatin (LIPITOR) 40 MG tablet TAKE 1 TABLET (40 MG TOTAL) BY MOUTH DAILY. FOR HYPERLIPIDEMIA 90 tablet 1 11/26/2016 at Unknown time  . busPIRone (BUSPAR) 15 MG tablet Take 1 tablet (15 mg total) by mouth 2 (two) times daily. 180 tablet 2 11/26/2016 at Unknown time  . Calcium Carb-Cholecalciferol (CALCIUM 500+D3) 500-400 MG-UNIT TABS Take 1 tablet by mouth 2 (two) times daily.   11/26/2016 at Unknown time  . docusate calcium (SURFAK) 240 MG capsule Take 1 capsule (240 mg total) by mouth daily as needed for mild constipation. 30 capsule 1 11/26/2016 at Unknown time  .  DULoxetine (CYMBALTA) 60 MG capsule Take 1 capsule (60 mg total) by mouth daily. 30 capsule 2 11/26/2016 at Unknown time  . fish oil-omega-3 fatty acids 1000 MG capsule Take 3 capsules (3 g total) by mouth daily. For hyperlipidemia. (Patient taking differently: Take 1 g by mouth 3 (three) times daily. For hyperlipidemia.) 30 capsule  11/26/2016 at Unknown time  . fluticasone (FLONASE) 50 MCG/ACT nasal spray Place 2 sprays into both nostrils 2 (two) times daily. Use as needed (Patient taking differently: Place 2 sprays into both nostrils 2 (two) times daily as needed for allergies. Use as needed) 16 g 6 11/26/2016 at Unknown time  . gabapentin (NEURONTIN) 100 MG capsule Take 1 capsule (100 mg total) by mouth at bedtime. 90 capsule 2 11/26/2016 at Unknown time  . lisinopril (PRINIVIL,ZESTRIL) 5 MG tablet Take 5 mg by mouth.   11/26/2016 at Unknown time  . meclizine (ANTIVERT) 25 MG tablet Take 25 mg by mouth 3 (three) times daily as needed for dizziness.   11/26/2016 at Unknown time  . nitroGLYCERIN (NITROSTAT) 0.4 MG SL tablet Place 0.4 mg under the tongue.   11/26/2016 at Unknown time  . omeprazole (PRILOSEC) 40 MG capsule Take 1 capsule (40 mg total) by mouth daily. [place]  capsule 2 11/26/2016 at Unknown time  . warfarin (COUMADIN) 2.5 MG tablet Take 2.5 mg by mouth daily.    11/26/2016 at 0800   Assessment: 82 y.o. female with medical history significant of recent diagnosis of atrial fibrillation, on chronic anticoagulation. Lung cancer status post radiation and chemotherapy with admission 7/21-22 for probably vasovagal syncope. Patient reported feeling weak, light-headed, and dizzy with extreme fatigue. Admitted with sepsis. Pharmacy consulted to manage coumadin.  INR 2.67 today  Goal of Therapy:  INR 2-3 Monitor platelets by anticoagulation protocol: Yes   Plan:  Coumadin 2 mg po today PT-INR daily Monitor for S/S of bleeding  Excell Seltzer, PharmD Clinical Pharmacist 12/04/2016,8:17 AM

## 2016-12-04 NOTE — Progress Notes (Signed)
PROGRESS NOTE    Alexandria Chen  XBL:[id number] DOB: 1934-12-17 DOA: 11/27/2016 PCP: [name], FNP    Brief Narrative: [name] an 82 y.o.femalepast medical history significant for chronic atrial fibrillation on Coumadin, lung cancer status post radiation chemotherapy, discharge from the hospital on 11/21/2016 for vasovagal syncope. Since then she's been falling at home, she relates she is lightheaded. She denies any loss of consciousness. No head trauma. Assessment and ED with a temperature of 101. She was Tx for sepsis of unclear source. BC have remained negative. She was also found to have low BP and has been on low dose ACE I. It should be noted that her CT of the chest reviewed a new partially cavitary lesion within the posteromedial RLL (intermediate--cannot r/out disease progression.) She continued to feel better and Cultures remained negative. She had PT, and PT suggested SNF placement. Family and patient is agreeable. She developed some confusion, felt to be due to sundowning, and CT head was negative. She was given Ativan x 1. CXR  12/02/16 showed small "developping PNA" in the LUL, and therefore, she was placed back on Zosyn and added IV Levaquin.  She is feeling better today.  No new event.    Assessment & Plan:   Principal Problem:   Syncope and collapse Active Problems:   Essential hypertension, benign   Squamous cell carcinoma of right lung (HCC)   Atrial fibrillation (HCC) [I48.91]   Sepsis ([place])   Anemia, unspecified   Warfarin anticoagulation   1/ Possible sepsis: She was started at first on IV Van/Zosyn, and Lucianne Lei was discontinued as cultures remained negative. She is now afebrile.  2/ Syncope: Vasovagal possible. But her BP was too low and it is possible she is over Tx with BP meds. have d/c lisinopril and IVF given, and now off IVF as well. Would allow higher BP outpatient before initiate any Tx. 3/ Lung CA: She will follow up with  oncology. 4/ Afib: Will continue with coumadin per pharmacy dosing.  5/ Anemia: Suspect dilution and plebotomy. She was transfuse 1 unit of PRBC and Hb rose to 9.1 g [er dL. 6/ PNA: Will continue with IV Zosyn, hold discharge, and add Levoquin to her regimen. She had some wheezing, but responded nicely to neb Tx.     DVT prophylaxis: Coumadin. Code Status: FULL CODE.  Family Communication: [name], daughter today.  Disposition Plan: SNF likely.   Consultants:   None.   Procedures:   None.   Antimicrobials: Anti-infectives    Start     Dose/Rate Route Frequency Ordered Stop   12/02/16 1400  piperacillin-tazobactam (ZOSYN) IVPB 3.375 g     3.375 g 12.5 mL/hr over 240 Minutes Intravenous Every 8 hours 12/02/16 1230     12/02/16 1300  levofloxacin (LEVAQUIN) IVPB 750 mg     750 mg 100 mL/hr over 90 Minutes Intravenous Every 48 hours 12/02/16 1227     11/28/16 1600  vancomycin (VANCOCIN) IVPB 1000 mg/200 mL premix  Status:  Discontinued     1,000 mg 200 mL/hr over 60 Minutes Intravenous Every 24 hours 11/27/16 1906 12/01/16 0840   11/27/16 2200  piperacillin-tazobactam (ZOSYN) IVPB 3.375 g  Status:  Discontinued     3.375 g 12.5 mL/hr over 240 Minutes Intravenous Every 8 hours 11/27/16 1906 12/02/16 0937   11/27/16 1545  vancomycin (VANCOCIN) IVPB 1000 mg/200 mL premix     1,000 mg 200 mL/hr over 60 Minutes Intravenous  Once 11/27/16 1534 11/28/16 0518  11/27/16 1545  piperacillin-tazobactam (ZOSYN) IVPB 3.375 g     3.375 g 100 mL/hr over 30 Minutes Intravenous  Once 11/27/16 1534 11/28/16 0519       Subjective:  " I feel pretty good. "  Objective: Vitals:   12/03/16 2118 12/04/16 0552 12/04/16 1305 12/04/16 1500  BP: (!) 145/67 (!) 155/86  (!) 149/76  Pulse: (!) 55 91  88  Resp: 20 20  18   Temp: 98.4 F (36.9 C) 98.4 F (36.9 C)  (!) 97.5 F (36.4 C)  TempSrc: Oral Oral  Oral  SpO2: 94% 95% 94% 95%  Weight:      Height:        Intake/Output Summary  (Last 24 hours) at 12/04/16 1701 Last data filed at 12/04/16 1500  Gross per 24 hour  Intake              600 ml  Output                0 ml  Net              600 ml   Filed Weights   11/27/16 1146 11/27/16 1845  Weight: 58.5 kg (129 lb) 59.8 kg (131 lb 13.4 oz)    Examination:  General exam: Appears calm and comfortable  Respiratory system: Clear to auscultation. Respiratory effort normal. Cardiovascular system: S1 & S2 heard, RRR. No JVD, murmurs, rubs, gallops or clicks. No pedal edema. Gastrointestinal system: Abdomen is nondistended, soft and nontender. No organomegaly or masses felt. Normal bowel sounds heard. Central nervous system: Alert and oriented. No focal neurological deficits. Extremities: Symmetric 5 x 5 power. Skin: No rashes, lesions or ulcers Psychiatry: Judgement and insight appear normal. Mood & affect appropriate.   Data Reviewed: I have personally reviewed following labs and imaging studies  CBC:  Recent Labs Lab 11/28/16 0711 11/29/16 0744 11/30/16 0620 12/01/16 0628 12/01/16 2141 12/02/16 0527  WBC 8.5 9.4 7.2 7.5  --  6.4  HGB 7.8* 7.8* 8.0* 7.6* 9.3* 9.1*  HCT 22.9* 23.4* 23.0* 22.6* 26.6* 26.6*  MCV 87.4 88.3 86.5 86.6  --  86.1  PLT 258 282 304 330  --  741   Basic Metabolic Panel:  Recent Labs Lab 11/28/16 0711 11/29/16 0744 11/30/16 0620 12/01/16 0628 12/03/16 0645  NA 138 136 131* 134* 137  K 3.4* 4.1 3.9 3.8 3.4*  CL 106 105 98* 99* 97*  CO2 24 25 25 26 28   GLUCOSE 114* 101* 112* 105* 115*  BUN 9 6 9 11 8   CREATININE 0.85 0.69 0.78 0.74 0.74  CALCIUM 8.3* 8.2* 8.6* 8.6* 8.8*   GFR: Coagulation Profile:  Recent Labs Lab 11/30/16 0620 12/01/16 0628 12/02/16 0527 12/03/16 0645 12/04/16 0557  INR 3.50 3.70 3.49 3.29 2.67   CBG:  Recent Labs Lab 12/01/16 2027  GLUCAP 122*   Sepsis Labs:  Recent Labs Lab 11/27/16 2008  PROCALCITON 0.36    Recent Results (from the past 240 hour(s))  Urine Culture      Status: None   Collection Time: 11/27/16  3:35 PM  Result Value Ref Range Status   Specimen Description URINE, CLEAN CATCH  Final   Special Requests NONE  Final   Culture   Final    NO GROWTH Performed at [facility], [address], [place], [place] [address]    Report Status 11/29/2016 FINAL  Final  Blood Culture (routine x 2)     Status: None   Collection  Time: 11/27/16  3:46 PM  Result Value Ref Range Status   Specimen Description BLOOD  Final   Special Requests NONE  Final   Culture NO GROWTH 5 DAYS  Final   Report Status 12/02/2016 FINAL  Final  Blood Culture (routine x 2)     Status: None   Collection Time: 11/27/16  3:48 PM  Result Value Ref Range Status   Specimen Description BLOOD  Final   Special Requests NONE  Final   Culture NO GROWTH 5 DAYS  Final   Report Status 12/02/2016 FINAL  Final     Radiology Studies: No results found.  Scheduled Meds: . albuterol  2.5 mg Nebulization Q6H [place]  . amiodarone  200 mg Oral Daily  . aspirin EC  81 mg Oral Daily  . atorvastatin  40 mg Oral q1800  . busPIRone  15 mg Oral BID  . cyanocobalamin  1,000 mcg Intramuscular Daily  . docusate sodium  100 mg Oral BID  . DULoxetine  60 mg Oral Daily  . gabapentin  100 mg Oral QHS  . pantoprazole  40 mg Oral Daily  . polyethylene glycol  17 g Oral BID  . sodium chloride flush  3 mL Intravenous Q12H  . Warfarin - Pharmacist Dosing Inpatient   Does not apply q1800   Continuous Infusions: . sodium chloride    . levofloxacin (LEVAQUIN) IV Stopped (12/04/16 1452)  . piperacillin-tazobactam (ZOSYN)  IV 3.375 g (12/04/16 1522)     LOS: 7 days   Keandrea Tapley, MD FACP Hospitalist.   If 7PM-7AM, please contact night-coverage [email or web address] Password [email or web address] 12/04/2016, 5:01 PM

## 2016-12-05 LAB — PROTIME-INR
INR: 2.7
PROTHROMBIN TIME: 29.2 s — AB (ref 11.4–15.2)

## 2016-12-05 MED ORDER — WARFARIN SODIUM 2 MG PO TABS
2.0000 mg | ORAL_TABLET | Freq: Once | ORAL | Status: AC
  Administered 2016-12-05: 2 mg via ORAL
  Filled 2016-12-05: qty 1

## 2016-12-05 NOTE — Progress Notes (Signed)
ANTICOAGULATION CONSULT NOTE  ANTIBIOTIC CONSULT NOTE  Pharmacy Consult for Coumadin, zosyn Indication: atrial fibrillation, PNA  No Known Allergies  Patient Measurements: Height: 5\' 2"  (157.5 cm) Weight: 131 lb 13.4 oz (59.8 kg) IBW/kg (Calculated) : 50.1  Vital Signs: Temp: 97.9 F (36.6 C) (08/05 0622) Temp Source: Oral (08/05 0622) BP: 162/72 (08/05 0622) Pulse Rate: 56 (08/05 0622)  Labs:  Recent Labs  12/03/16 0645 12/04/16 0557 12/05/16 0625  LABPROT 34.2* 29.0* 29.2*  INR 3.29 2.67 2.70  CREATININE 0.74  --   --    Estimated Creatinine Clearance: 44.4 mL/min (by C-G formula based on SCr of 0.74 mg/dL).  Medical History: Past Medical History:  Diagnosis Date  . Alcohol abuse, in remission   . Anxiety   . Atrial fibrillation ([place])   . Depression   . Dyspnea    walking distances   . Dysrhythmia   . History of bronchitis   . HTN (hypertension)   . Mass of right lung 07/13/2016  . Osteopenia   . Squamous cell carcinoma of right lung ([place]) 07/13/2016   Medications:  Prescriptions Prior to Admission  Medication Sig Dispense Refill Last Dose  . amiodarone (PACERONE) 200 MG tablet Take 200 mg by mouth daily.    11/26/2016 at Unknown time  . aspirin EC 81 MG tablet Take 81 mg by mouth daily.   11/26/2016 at Unknown time  . atorvastatin (LIPITOR) 40 MG tablet TAKE 1 TABLET (40 MG TOTAL) BY MOUTH DAILY. FOR HYPERLIPIDEMIA 90 tablet 1 11/26/2016 at Unknown time  . busPIRone (BUSPAR) 15 MG tablet Take 1 tablet (15 mg total) by mouth 2 (two) times daily. 180 tablet 2 11/26/2016 at Unknown time  . Calcium Carb-Cholecalciferol (CALCIUM 500+D3) 500-400 MG-UNIT TABS Take 1 tablet by mouth 2 (two) times daily.   11/26/2016 at Unknown time  . docusate calcium (SURFAK) 240 MG capsule Take 1 capsule (240 mg total) by mouth daily as needed for mild constipation. 30 capsule 1 11/26/2016 at Unknown time  . DULoxetine (CYMBALTA) 60 MG capsule Take 1 capsule (60 mg total) by mouth  daily. 30 capsule 2 11/26/2016 at Unknown time  . fish oil-omega-3 fatty acids 1000 MG capsule Take 3 capsules (3 g total) by mouth daily. For hyperlipidemia. (Patient taking differently: Take 1 g by mouth 3 (three) times daily. For hyperlipidemia.) 30 capsule  11/26/2016 at Unknown time  . fluticasone (FLONASE) 50 MCG/ACT nasal spray Place 2 sprays into both nostrils 2 (two) times daily. Use as needed (Patient taking differently: Place 2 sprays into both nostrils 2 (two) times daily as needed for allergies. Use as needed) 16 g 6 11/26/2016 at Unknown time  . gabapentin (NEURONTIN) 100 MG capsule Take 1 capsule (100 mg total) by mouth at bedtime. 90 capsule 2 11/26/2016 at Unknown time  . lisinopril (PRINIVIL,ZESTRIL) 5 MG tablet Take 5 mg by mouth.   11/26/2016 at Unknown time  . meclizine (ANTIVERT) 25 MG tablet Take 25 mg by mouth 3 (three) times daily as needed for dizziness.   11/26/2016 at Unknown time  . nitroGLYCERIN (NITROSTAT) 0.4 MG SL tablet Place 0.4 mg under the tongue.   11/26/2016 at Unknown time  . omeprazole (PRILOSEC) 40 MG capsule Take 1 capsule (40 mg total) by mouth daily. 30 capsule 2 11/26/2016 at Unknown time  . warfarin (COUMADIN) 2.5 MG tablet Take 2.5 mg by mouth daily.    11/26/2016 at 0800   Assessment: 82 y.o. female with medical history significant of recent diagnosis  of atrial fibrillation, on chronic anticoagulation. Lung cancer status post radiation and chemotherapy with admission 7/21-22 for probably vasovagal syncope. Patient reported feeling weak, light-headed, and dizzy with extreme fatigue. Admitted with sepsis. Pharmacy consulted to manage Zosyn and coumadin.  Day 8 Zosyn. Afeb, WBC normal.  Consider dc zosyn  INR today 2.70  Goal of Therapy:  INR 2-3 Monitor platelets by anticoagulation protocol: Yes   Plan:  Coumadin 2 mg po today PT-INR daily Monitor for S/S of bleeding D/C zosyn?  Excell Seltzer, PharmD Clinical Pharmacist 12/05/2016,8:57 AM

## 2016-12-05 NOTE — Progress Notes (Signed)
PROGRESS NOTE    Alexandria Chen  LKG:[id number] DOB: 1934/12/22 DOA: 11/27/2016 PCP: [name], FNP    Brief Narrative: Alexandria Chen an 82 y.o.femalepast medical history significant for chronic atrial fibrillation on Coumadin, lung cancer status post radiation chemotherapy, discharge from the hospital on 11/21/2016 for vasovagal syncope. Since then she's been falling at home, she relates she is lightheaded. She denies any loss of consciousness. No head trauma. Assessment and ED with a temperature of 101. She was Tx for sepsis of unclear source. BC have remained negative. She was also found to have low BP and has been on low dose ACE I. It should be noted that her CT of the chest reviewed a new partially cavitary lesion within the posteromedial RLL (intermediate--cannot r/out disease progression.) She continued to feel better and Cultures remained negative. She had PT, and PT suggested SNF placement. Family and patient is agreeable. She developed some confusion, felt to be due to sundowning, and CT head was negative. She was given Ativan x 1.CXR 12/02/16 showed small "developping PNA" in the LUL, and therefore, she was placed back on Zosyn and added IV Levaquin. She is feeling better today. No new event.    Assessment & Plan:   Principal Problem:   Syncope and collapse Active Problems:   Essential hypertension, benign   Squamous cell carcinoma of right lung (HCC)   Atrial fibrillation (HCC) [I48.91]   Sepsis ([place])   Anemia, unspecified   Warfarin anticoagulation   1/ Possible sepsis: She was started at first on IV Van/Zosyn, and Lucianne Lei was discontinued as cultures remained negative. She is now afebrile.  2/ Syncope: Vasovagal possible. But her BP was too low and it is possible she is over Tx with BP meds. have d/c lisinopril and IVF given, and now off IVF as well. Would allow higher BP outpatient before initiate any Tx. 3/ Lung CA: She will follow up with  oncology. 4/ Afib: Will continue with coumadin per pharmacy dosing.  5/ Anemia: Suspect dilution and plebotomy. She was transfuse 1 unit of PRBC and Hb rose to9.1 g [er dL. 6/ PNA: Will continue with IV Zosyn, hold discharge, and add Levoquin to her regimen. She had some wheezing, but responded nicely to neb Tx.   DVT prophylaxis: Coumadin. Code Status: FULL CODE.  Family Communication: Joelene Millin at bedside today.  Disposition Plan:  Likely SNF.   Consultants:   None.   Procedures:   None.   Antimicrobials: Anti-infectives    Start     Dose/Rate Route Frequency Ordered Stop   12/02/16 1400  piperacillin-tazobactam (ZOSYN) IVPB 3.375 g     3.375 g 12.5 mL/hr over 240 Minutes Intravenous Every 8 hours 12/02/16 1230     12/02/16 1300  levofloxacin (LEVAQUIN) IVPB 750 mg     750 mg 100 mL/hr over 90 Minutes Intravenous Every 48 hours 12/02/16 1227     11/28/16 1600  vancomycin (VANCOCIN) IVPB 1000 mg/200 mL premix  Status:  Discontinued     1,000 mg 200 mL/hr over 60 Minutes Intravenous Every 24 hours 11/27/16 1906 12/01/16 0840   11/27/16 2200  piperacillin-tazobactam (ZOSYN) IVPB 3.375 g  Status:  Discontinued     3.375 g 12.5 mL/hr over 240 Minutes Intravenous Every 8 hours 11/27/16 1906 12/02/16 0937   11/27/16 1545  vancomycin (VANCOCIN) IVPB 1000 mg/200 mL premix     1,000 mg 200 mL/hr over 60 Minutes Intravenous  Once 11/27/16 1534 11/28/16 0518   11/27/16 1545  piperacillin-tazobactam (ZOSYN) IVPB 3.375 g     3.375 g 100 mL/hr over 30 Minutes Intravenous  Once 11/27/16 1534 11/28/16 0519       Subjective:  I feel pretty good.   Objective: Vitals:   12/04/16 2026 12/04/16 2106 12/05/16 0622 12/05/16 0814  BP:  (!) 142/67 (!) 162/72   Pulse:  78 (!) 56   Resp:  16 16   Temp:  98.2 F (36.8 C) 97.9 F (36.6 C)   TempSrc:  Oral Oral   SpO2: 95% 96% 97% 95%  Weight:      Height:        Intake/Output Summary (Last 24 hours) at 12/05/16 1041 Last  data filed at 12/05/16 0621  Gross per 24 hour  Intake              930 ml  Output              850 ml  Net               80 ml   Filed Weights   11/27/16 1146 11/27/16 1845  Weight: 58.5 kg (129 lb) 59.8 kg (131 lb 13.4 oz)    Examination:  General exam: Appears calm and comfortable  Respiratory system: Clear to auscultation. Respiratory effort normal. Cardiovascular system: S1 & S2 heard, RRR. No JVD, murmurs, rubs, gallops or clicks. No pedal edema. Gastrointestinal system: Abdomen is nondistended, soft and nontender. No organomegaly or masses felt. Normal bowel sounds heard. Central nervous system: Alert and oriented. No focal neurological deficits. Extremities: Symmetric 5 x 5 power. Skin: No rashes, lesions or ulcers Psychiatry: Judgement and insight appear normal. Mood & affect appropriate.   Data Reviewed: I have personally reviewed following labs and imaging studies  CBC:  Recent Labs Lab 11/29/16 0744 11/30/16 0620 12/01/16 0628 12/01/16 2141 12/02/16 0527  WBC 9.4 7.2 7.5  --  6.4  HGB 7.8* 8.0* 7.6* 9.3* 9.1*  HCT 23.4* 23.0* 22.6* 26.6* 26.6*  MCV 88.3 86.5 86.6  --  86.1  PLT 282 304 330  --  711   Basic Metabolic Panel:  Recent Labs Lab 11/29/16 0744 11/30/16 0620 12/01/16 0628 12/03/16 0645  NA 136 131* 134* 137  K 4.1 3.9 3.8 3.4*  CL 105 98* 99* 97*  CO2 25 25 26 28   GLUCOSE 101* 112* 105* 115*  BUN 6 9 11 8   CREATININE 0.69 0.78 0.74 0.74  CALCIUM 8.2* 8.6* 8.6* 8.8*   GFR: Coagulation Profile:  Recent Labs Lab 12/01/16 0628 12/02/16 0527 12/03/16 0645 12/04/16 0557 12/05/16 0625  INR 3.70 3.49 3.29 2.67 2.70   CBG:  Recent Labs Lab 12/01/16 2027  GLUCAP 122*   Recent Results (from the past 240 hour(s))  Urine Culture     Status: None   Collection Time: 11/27/16  3:35 PM  Result Value Ref Range Status   Specimen Description URINE, CLEAN CATCH  Final   Special Requests NONE  Final   Culture   Final    NO  GROWTH Performed at [facility], [place] [address] [place], [place] [address]    Report Status 11/29/2016 FINAL  Final  Blood Culture (routine x 2)     Status: None   Collection Time: 11/27/16  3:46 PM  Result Value Ref Range Status   Specimen Description BLOOD  Final   Special Requests NONE  Final   Culture NO GROWTH 5 DAYS  Final   Report Status 12/02/2016 FINAL  Final  Blood Culture (routine x 2)     Status: None   Collection Time: 11/27/16  3:48 PM  Result Value Ref Range Status   Specimen Description BLOOD  Final   Special Requests NONE  Final   Culture NO GROWTH 5 DAYS  Final   Report Status 12/02/2016 FINAL  Final     Radiology Studies: No results found.  Scheduled Meds: . albuterol  2.5 mg Nebulization Q6H WA  . amiodarone  200 mg Oral Daily  . aspirin EC  81 mg Oral Daily  . atorvastatin  40 mg Oral q1800  . busPIRone  15 mg Oral BID  . cyanocobalamin  1,000 mcg Intramuscular Daily  . docusate sodium  100 mg Oral BID  . DULoxetine  60 mg Oral Daily  . gabapentin  100 mg Oral QHS  . pantoprazole  40 mg Oral Daily  . polyethylene glycol  17 g Oral BID  . sodium chloride flush  3 mL Intravenous Q12H  . warfarin  2 mg Oral Once  . Warfarin - Pharmacist Dosing Inpatient   Does not apply q1800   Continuous Infusions: . sodium chloride    . levofloxacin (LEVAQUIN) IV Stopped (12/04/16 1452)  . piperacillin-tazobactam (ZOSYN)  IV Stopped (12/05/16 1022)     LOS: 8 days   Jayda White, MD FACP Hospitalist.   If 7PM-7AM, please contact night-coverage [email or web address] Password [email or web address] 12/05/2016, 10:41 AM

## 2016-12-06 DIAGNOSIS — G629 Polyneuropathy, unspecified: Secondary | ICD-10-CM | POA: Diagnosis not present

## 2016-12-06 DIAGNOSIS — M6281 Muscle weakness (generalized): Secondary | ICD-10-CM | POA: Diagnosis not present

## 2016-12-06 DIAGNOSIS — R2689 Other abnormalities of gait and mobility: Secondary | ICD-10-CM | POA: Diagnosis not present

## 2016-12-06 DIAGNOSIS — K219 Gastro-esophageal reflux disease without esophagitis: Secondary | ICD-10-CM | POA: Diagnosis not present

## 2016-12-06 DIAGNOSIS — K7689 Other specified diseases of liver: Secondary | ICD-10-CM | POA: Diagnosis not present

## 2016-12-06 DIAGNOSIS — J449 Chronic obstructive pulmonary disease, unspecified: Secondary | ICD-10-CM | POA: Diagnosis not present

## 2016-12-06 DIAGNOSIS — R590 Localized enlarged lymph nodes: Secondary | ICD-10-CM | POA: Diagnosis not present

## 2016-12-06 DIAGNOSIS — R509 Fever, unspecified: Secondary | ICD-10-CM | POA: Diagnosis not present

## 2016-12-06 DIAGNOSIS — R55 Syncope and collapse: Secondary | ICD-10-CM | POA: Diagnosis not present

## 2016-12-06 DIAGNOSIS — R278 Other lack of coordination: Secondary | ICD-10-CM | POA: Diagnosis not present

## 2016-12-06 DIAGNOSIS — I700 Atherosclerosis of aorta: Secondary | ICD-10-CM | POA: Diagnosis not present

## 2016-12-06 DIAGNOSIS — I10 Essential (primary) hypertension: Secondary | ICD-10-CM | POA: Diagnosis not present

## 2016-12-06 DIAGNOSIS — K769 Liver disease, unspecified: Secondary | ICD-10-CM | POA: Diagnosis not present

## 2016-12-06 DIAGNOSIS — I481 Persistent atrial fibrillation: Secondary | ICD-10-CM | POA: Diagnosis not present

## 2016-12-06 DIAGNOSIS — F419 Anxiety disorder, unspecified: Secondary | ICD-10-CM | POA: Diagnosis not present

## 2016-12-06 DIAGNOSIS — J441 Chronic obstructive pulmonary disease with (acute) exacerbation: Secondary | ICD-10-CM | POA: Diagnosis not present

## 2016-12-06 DIAGNOSIS — C3490 Malignant neoplasm of unspecified part of unspecified bronchus or lung: Secondary | ICD-10-CM | POA: Diagnosis not present

## 2016-12-06 DIAGNOSIS — E785 Hyperlipidemia, unspecified: Secondary | ICD-10-CM | POA: Diagnosis not present

## 2016-12-06 DIAGNOSIS — J439 Emphysema, unspecified: Secondary | ICD-10-CM | POA: Diagnosis not present

## 2016-12-06 DIAGNOSIS — C3491 Malignant neoplasm of unspecified part of right bronchus or lung: Secondary | ICD-10-CM | POA: Diagnosis not present

## 2016-12-06 DIAGNOSIS — R918 Other nonspecific abnormal finding of lung field: Secondary | ICD-10-CM | POA: Diagnosis not present

## 2016-12-06 DIAGNOSIS — J189 Pneumonia, unspecified organism: Secondary | ICD-10-CM | POA: Diagnosis not present

## 2016-12-06 DIAGNOSIS — I4891 Unspecified atrial fibrillation: Secondary | ICD-10-CM | POA: Diagnosis not present

## 2016-12-06 DIAGNOSIS — I517 Cardiomegaly: Secondary | ICD-10-CM | POA: Diagnosis not present

## 2016-12-06 DIAGNOSIS — I2510 Atherosclerotic heart disease of native coronary artery without angina pectoris: Secondary | ICD-10-CM | POA: Diagnosis not present

## 2016-12-06 DIAGNOSIS — C3411 Malignant neoplasm of upper lobe, right bronchus or lung: Secondary | ICD-10-CM | POA: Diagnosis not present

## 2016-12-06 LAB — BASIC METABOLIC PANEL
Anion gap: 6 (ref 5–15)
BUN: 9 mg/dL (ref 6–20)
CHLORIDE: 105 mmol/L (ref 101–111)
CO2: 28 mmol/L (ref 22–32)
Calcium: 9 mg/dL (ref 8.9–10.3)
Creatinine, Ser: 0.88 mg/dL (ref 0.44–1.00)
GFR calc non Af Amer: >60 mL/min (ref >60)
Glucose, Bld: 97 mg/dL (ref 65–99)
POTASSIUM: 3.5 mmol/L (ref 3.5–5.1)
Sodium: 139 mmol/L (ref 135–145)

## 2016-12-06 LAB — PROTIME-INR
INR: 3.4
PROTHROMBIN TIME: 35.1 s — AB (ref 11.4–15.2)

## 2016-12-06 MED ORDER — HEPARIN SOD (PORK) LOCK FLUSH 100 UNIT/ML IV SOLN
500.0000 [IU] | INTRAVENOUS | Status: AC | PRN
  Administered 2016-12-06: 500 [IU]
  Filled 2016-12-06: qty 5

## 2016-12-06 MED ORDER — LEVOFLOXACIN 750 MG PO TABS: 750.0000 mg | ORAL_TABLET | ORAL | 0 refills | Status: DC

## 2016-12-06 MED ORDER — ACETAMINOPHEN 325 MG PO TABS: 650.0000 mg | ORAL_TABLET | ORAL | 1 refills | Status: AC | PRN

## 2016-12-06 MED ORDER — LEVOFLOXACIN 750 MG PO TABS
750.0000 mg | ORAL_TABLET | ORAL | Status: DC
  Administered 2016-12-06: 750 mg via ORAL
  Filled 2016-12-06: qty 1

## 2016-12-06 MED ORDER — ALBUTEROL SULFATE (2.5 MG/3ML) 0.083% IN NEBU: 2.5000 mg | INHALATION_SOLUTION | RESPIRATORY_TRACT | 12 refills | Status: DC | PRN

## 2016-12-06 NOTE — Clinical Social Work Note (Signed)
LCSW met with patient and her four daughters.  LCSW discussed placement options. LCSW secured an agreeable bed for patient and family at [facility]).    Deanie Jupiter, Clydene Pugh, LCSW

## 2016-12-06 NOTE — Progress Notes (Signed)
ANTICOAGULATION CONSULT NOTE  ANTIBIOTIC CONSULT NOTE  Pharmacy Consult for Coumadin, zosyn Indication: atrial fibrillation, PNA  No Known Allergies  Patient Measurements: Height: 5\' 2"  (157.5 cm) Weight: 131 lb 13.4 oz (59.8 kg) IBW/kg (Calculated) : 50.1  Vital Signs: Temp: 97.9 F (36.6 C) (08/06 0426) Temp Source: Oral (08/06 0426) BP: 139/67 (08/06 0426) Pulse Rate: 44 (08/06 0426)  Labs:  Recent Labs  12/04/16 0557 12/05/16 0625 12/06/16 0748  LABPROT 29.0* 29.2* 35.1*  INR 2.67 2.70 3.40  CREATININE  --   --  0.88   Estimated Creatinine Clearance: 40.3 mL/min (by C-G formula based on SCr of 0.88 mg/dL).  Medical History: Past Medical History:  Diagnosis Date  . Alcohol abuse, in remission   . Anxiety   . Atrial fibrillation ([place])   . Depression   . Dyspnea    walking distances   . Dysrhythmia   . History of bronchitis   . HTN (hypertension)   . Mass of right lung 07/13/2016  . Osteopenia   . Squamous cell carcinoma of right lung ([place]) 07/13/2016   Medications:  Prescriptions Prior to Admission  Medication Sig Dispense Refill Last Dose  . amiodarone (PACERONE) 200 MG tablet Take 200 mg by mouth daily.    11/26/2016 at Unknown time  . aspirin EC 81 MG tablet Take 81 mg by mouth daily.   11/26/2016 at Unknown time  . atorvastatin (LIPITOR) 40 MG tablet TAKE 1 TABLET (40 MG TOTAL) BY MOUTH DAILY. FOR HYPERLIPIDEMIA 90 tablet 1 11/26/2016 at Unknown time  . busPIRone (BUSPAR) 15 MG tablet Take 1 tablet (15 mg total) by mouth 2 (two) times daily. 180 tablet 2 11/26/2016 at Unknown time  . Calcium Carb-Cholecalciferol (CALCIUM 500+D3) 500-400 MG-UNIT TABS Take 1 tablet by mouth 2 (two) times daily.   11/26/2016 at Unknown time  . docusate calcium (SURFAK) 240 MG capsule Take 1 capsule (240 mg total) by mouth daily as needed for mild constipation. 30 capsule 1 11/26/2016 at Unknown time  . DULoxetine (CYMBALTA) 60 MG capsule Take 1 capsule (60 mg total) by mouth  daily. 30 capsule 2 11/26/2016 at Unknown time  . fish oil-omega-3 fatty acids 1000 MG capsule Take 3 capsules (3 g total) by mouth daily. For hyperlipidemia. (Patient taking differently: Take 1 g by mouth 3 (three) times daily. For hyperlipidemia.) 30 capsule  11/26/2016 at Unknown time  . fluticasone (FLONASE) 50 MCG/ACT nasal spray Place 2 sprays into both nostrils 2 (two) times daily. Use as needed (Patient taking differently: Place 2 sprays into both nostrils 2 (two) times daily as needed for allergies. Use as needed) 16 g 6 11/26/2016 at Unknown time  . gabapentin (NEURONTIN) 100 MG capsule Take 1 capsule (100 mg total) by mouth at bedtime. 90 capsule 2 11/26/2016 at Unknown time  . lisinopril (PRINIVIL,ZESTRIL) 5 MG tablet Take 5 mg by mouth.   11/26/2016 at Unknown time  . meclizine (ANTIVERT) 25 MG tablet Take 25 mg by mouth 3 (three) times daily as needed for dizziness.   11/26/2016 at Unknown time  . nitroGLYCERIN (NITROSTAT) 0.4 MG SL tablet Place 0.4 mg under the tongue.   11/26/2016 at Unknown time  . omeprazole (PRILOSEC) 40 MG capsule Take 1 capsule (40 mg total) by mouth daily. 30 capsule 2 11/26/2016 at Unknown time  . warfarin (COUMADIN) 2.5 MG tablet Take 2.5 mg by mouth daily.    11/26/2016 at 0800   Assessment: 82 y.o. female with medical history significant of recent diagnosis  of atrial fibrillation, on chronic anticoagulation. Lung cancer status post radiation and chemotherapy with admission 7/21-22 for probably vasovagal syncope. Patient reported feeling weak, light-headed, and dizzy with extreme fatigue. Admitted with sepsis. Pharmacy consulted to manage Zosyn and coumadin.  Day 9 Zosyn. Afeb, WBC normal.  Consider dc zosyn  INR today 3.40  Goal of Therapy:  INR 2-3 Monitor platelets by anticoagulation protocol: Yes  Eradicate infection.    Plan:  No Coumadin today PT-INR daily Monitor for S/S of bleeding D/C zosyn? Change Levaquin to PO.  Pricilla Larsson,  RPH 12/06/2016,10:46 AM   PHARMACIST - PHYSICIAN COMMUNICATION DR:   [facility] CONCERNING: Antibiotic IV to Oral Route Change Policy  RECOMMENDATION: This patient is receiving Levaquin by the intravenous route.  Based on criteria approved by the Pharmacy and Therapeutics Committee, the antibiotic(s) is/are being converted to the equivalent oral dose form(s).   DESCRIPTION: These criteria include:  Patient being treated for a respiratory tract infection, urinary tract infection, cellulitis or clostridium difficile associated diarrhea if on metronidazole  The patient is not neutropenic and does not exhibit a GI malabsorption state  The patient is eating (either orally or via tube) and/or has been taking other orally administered medications for a least 24 hours  The patient is improving clinically and has a Tmax < 100.5  If you have questions about this conversion, please contact the Pharmacy Department  [x]   [phone] )  [facility] []   [phone] )  [facility] []   [phone] )  [facility] []   [phone] )  [facility] []   [phone] )  [facility]  Shanya, [place], [facility] 12/06/2016, 10:50 AM

## 2016-12-06 NOTE — Discharge Summary (Signed)
Physician Discharge Summary  REISE HIETALA QMV:[id number] DOB: Aug 05, 1934 DOA: 11/27/2016  PCP: [name], FNP  Admit date: 11/27/2016 Discharge date: 12/06/2016  Admitted From: Home.  Disposition:  SNF.   Recommendations for Outpatient Follow-up:  1. Follow up with PCP in 1-2 weeks 2. Please obtain BMP/CBC in one week 3. Please follow up on the following pending results:  Home Health: None. Equipment/Devices: None.  Discharge Condition: No SOB.  No evidence of infection.  BP controlled off medication.   CODE STATUS: FULL CODE.  Diet recommendation:  Carb modified diet.   Brief/Interim Summary: Patient was admitted for near syncope, having symptoms suggestive of sepsis, admitted on November 27, 2016 by Karmen Bongo, MD.  As per her H and P:  " Alexandria Chen is a 82 y.o. female with medical history significant of recent diagnosis of atrial fibrillation, on anticoagulation, lung cancer status post radiation and chemotherapy with admission 7/21-22 for probably vasovagal syncope.  Since discharge, she has been falling lately at home.  She was going to the bathroom overnight and she landed on the floor.  She has complained about back, head, and buttocks were hurting.  Today, they were helping her to bathe and she passed out. She was sitting in the bathtub when she became unresponsive and fell backwards.  It was supervised since family was helping her bathe.  Family now says she was never unconscious but would not respond.  She recently completed chemo/rads, did well during the treatment but she hd a similar episode to this last week.  No fevers.  +cough - worsening, given Mucinex yesterday.  Intermittent n/v but better since last week.  She was previously constipated and this was treated and so stools are intermittently watery over the last 3 weeks.  No urinary symptoms.  She was seen by her PCP yesterday and reported feeling weak, light-headed, and dizzy with extreme fatigue.  HOSPITAL COURSE:   AKEYA RYTHER an 82 y.o.femalepast medical history significant for chronic atrial fibrillation on Coumadin, lung cancer status post radiation chemotherapy, discharge from the hospital on 11/21/2016 for vasovagal syncope. Since then she's been falling at home, she relates she is lightheaded. She denies any loss of consciousness. No head trauma. Assessment and ED with a temperature of 101. She was Tx for sepsis of unclear source. BC have remained negative to date.  Her family have all been very involved in her care, recording conversations, and are very interested in all aspects of her care.  Her daughters Lorenza Chick, and Elzie Rings, along with her son have been explained in details about the side effects of all her medications and her medical interventions. She was also found to have low BP and has been on low dose ACE I. Given her recurrent dizziness, and orthostatic symptomology, I have elected to d/c her BP med, and would not treat unless her BP is at least above 180, as she is likely to be affected by low BP if controlling BP is too tight. Regarding her cancer, It should be noted that her CT of the chest revealed a new partially cavitary lesion within the posteromedial RLL (intermediate--cannot r/out disease progression.).  She will need to follow up with oncology for follow up.  She continued to feel better, albeit very slowly. Again, her cultures remained negative. She had PT, and PT suggested SNF placement. At the beginning, there was no concensus as to whether she will go to SNF or home. Finally, she is agreeable expressing that  it is her children's wishes. One night, she developed some confusion, felt to be due to sundowning, and CT head was done by the oncall physician and it was negative. She was given Ativan x 1.She was about to be discharged, but she developed some coughs and congestion.  She responded to neb Tx, and fortunately did not require steroid for her wheezing. CXR 12/02/16  showed small "developping PNA" in the LUL, and therefore, she was placed back on Zosyn and added IV Levaquin. She was again kept in the hospital for IV Zosyn and Levoquin.  Eventually, she felt well, able to seat up and eat, and has no clinical evidence of infection, on RA with good saturation.  Today, she accepted to be transferred to SNF.  She will be on Levoquin for another week, taking 750mg  every 48 hours.  She will resume her Coumadin with close INR checks.  Note that she was supratherapeutic on her INR at the beginning.  I explained to Gaspar Bidding, and Joelene Millin that her mother has very little reserve, and that family should designate a HCP to help decide medical decision so future care can be done more swiftly and smoothly.  Thank you for allowing me to participate in her care.  Good Day.     ED Course:  Given 1L fluid bolus, concern for sepsis so given Vanc/Zosyn  Review of Systems: As per HPI; otherwise review of systems reviewed and negative.   Ambulatory Status:  Ambulates at times with a cane  Discharge Diagnoses:  Principal Problem:   Syncope and collapse Active Problems:   Essential hypertension, benign   Squamous cell carcinoma of right lung (HCC)   Atrial fibrillation (HCC) [I48.91]   Sepsis (HCC)   Anemia, unspecified   Warfarin anticoagulation    Discharge Instructions  Discharge Instructions    Diet - low sodium heart healthy    Complete by:  As directed    Discharge instructions    Complete by:  As directed    Follow up with PCP later this week.  Finish the antibiotics as prescribed.   Increase activity slowly    Complete by:  As directed      Allergies as of 12/06/2016   No Known Allergies     Medication List    STOP taking these medications   CALCIUM 500+D3 500-400 MG-UNIT Tabs Generic drug:  Calcium Carb-Cholecalciferol   docusate calcium 240 MG capsule Commonly known as:  SURFAK   lisinopril 5 MG tablet Commonly known as:   PRINIVIL,ZESTRIL   meclizine 25 MG tablet Commonly known as:  ANTIVERT     TAKE these medications   acetaminophen 325 MG tablet Commonly known as:  TYLENOL Take 2 tablets (650 mg total) by mouth every 4 (four) hours as needed for mild pain, moderate pain, fever or headache.   albuterol (2.5 MG/3ML) 0.083% nebulizer solution Commonly known as:  PROVENTIL Take 3 mLs (2.5 mg total) by nebulization every 4 (four) hours as needed for wheezing or shortness of breath.   amiodarone 200 MG tablet Commonly known as:  PACERONE Take 200 mg by mouth daily.   aspirin EC 81 MG tablet Take 81 mg by mouth daily.   atorvastatin 40 MG tablet Commonly known as:  LIPITOR TAKE 1 TABLET (40 MG TOTAL) BY MOUTH DAILY. FOR HYPERLIPIDEMIA   busPIRone 15 MG tablet Commonly known as:  BUSPAR Take 1 tablet (15 mg total) by mouth 2 (two) times daily.   DULoxetine 60 MG capsule Commonly  known as:  CYMBALTA Take 1 capsule (60 mg total) by mouth daily.   fish oil-omega-3 fatty acids 1000 MG capsule Take 3 capsules (3 g total) by mouth daily. For hyperlipidemia. What changed:  how much to take  when to take this  additional instructions   fluticasone 50 MCG/ACT nasal spray Commonly known as:  FLONASE Place 2 sprays into both nostrils 2 (two) times daily. Use as needed What changed:  when to take this  reasons to take this  additional instructions   gabapentin 100 MG capsule Commonly known as:  NEURONTIN Take 1 capsule (100 mg total) by mouth at bedtime.   levofloxacin 750 MG tablet Commonly known as:  LEVAQUIN Take 1 tablet (750 mg total) by mouth every other day.   nitroGLYCERIN 0.4 MG SL tablet Commonly known as:  NITROSTAT Place 0.4 mg under the tongue.   omeprazole 40 MG capsule Commonly known as:  PRILOSEC Take 1 capsule (40 mg total) by mouth daily.   warfarin 2.5 MG tablet Commonly known as:  COUMADIN Take 2.5 mg by mouth daily.      Contact information for  after-discharge care    [facility] .   Specialty:  [place] information: [address] [place] [phone]             No Known Allergies  Consultations:  None.    Procedures/Studies: Dg Chest 2 View  Result Date: 12/02/2016 CLINICAL DATA:  Cough EXAM: CHEST  2 VIEW COMPARISON:  11/20/2016 FINDINGS: Low lung volumes. Right Port-A-Cath remains in place, unchanged. Heart is borderline in size, accentuated by the low volumes and AP nature of the study. Patchy airspace disease in the left upper lobe is new since prior study. No confluent opacity on the right. No visible effusions or acute bony abnormality. IMPRESSION: New patchy left upper lobe airspace disease concerning for pneumonia. Low lung volumes. Electronically Signed   By: Rolm Baptise M.D.   On: 12/02/2016 11:37   Dg Chest 2 View  Result Date: 11/20/2016 CLINICAL DATA:  Coughing congestion. EXAM: CHEST  2 VIEW COMPARISON:  08/27/2016 FINDINGS: The cardiopericardial silhouette is within normal limits for size. Lungs are hyperexpanded. Interstitial markings are diffusely coarsened with chronic features. Left suprahilar mass appears smaller. Bones are diffusely demineralized. Telemetry leads overlie the chest. Right Port-A-Cath tip overlies the proximal SVC. IMPRESSION: 1. Hyperexpansion with chronic interstitial coarsening, compatible with emphysema. 2. Right suprahilar mass lesion appears smaller in the interval. Electronically Signed   By: Misty Stanley M.D.   On: 11/20/2016 15:15   Ct Head Wo Contrast  Result Date: 12/02/2016 CLINICAL DATA:  Increased altered mental status since 12/01/2016. Confusion. EXAM: CT HEAD WITHOUT CONTRAST TECHNIQUE: Contiguous axial images were obtained from the base of the skull through the vertex without intravenous contrast. COMPARISON:  11/27/2016 FINDINGS: Brain: Bilateral cerebral atrophy. Low-attenuation  changes throughout the deep white matter consistent with small vessel ischemia. No evidence of acute infarction, hemorrhage, hydrocephalus, extra-axial collection or mass lesion/mass effect. Vascular: Vascular calcifications are present in the internal carotid arteries. Skull: No depressed skull fractures. Sinuses/Orbits: Paranasal sinuses and mastoid air cells are clear. Other: No significant change since previous study. IMPRESSION: No acute intracranial abnormalities. Chronic atrophy and small vessel ischemic changes. No change since previous study. Electronically Signed   By: Lucienne Capers M.D.   On: 12/02/2016 01:31   Ct Head Wo Contrast  Result Date: 11/27/2016 CLINICAL DATA:  Patient status post fall.  Dizziness. EXAM: CT HEAD WITHOUT CONTRAST TECHNIQUE: Contiguous axial images were obtained from the base of the skull through the vertex without intravenous contrast. COMPARISON:  Brain CT 11/20/2016. FINDINGS: Brain: Periventricular and subcortical white matter hypodensity compatible with chronic microvascular ischemic changes. No evidence for acute cortically based infarct, intracranial hemorrhage, mass lesion or mass-effect. Old left basal ganglia lacunar infarct. Vascular: No hyperdense vessel or unexpected calcification. Skull: Intact. Sinuses/Orbits: Paranasal sinuses are well aerated. Mastoid air cells unremarkable. Other: None. IMPRESSION: No acute intracranial process. Atrophy and chronic microvascular ischemic changes. Electronically Signed   By: Lovey Newcomer M.D.   On: 11/27/2016 15:09   Ct Head Wo Contrast  Result Date: 11/20/2016 CLINICAL DATA:  Patient found passed out outside. Patient on Coumadin and history of lung cancer. EXAM: CT HEAD WITHOUT CONTRAST TECHNIQUE: Contiguous axial images were obtained from the base of the skull through the vertex without intravenous contrast. COMPARISON:  CT 07/06/2016 and MRI brain 09/22/2016 FINDINGS: Brain: Ventricles, cisterns and other CSF spaces  are within normal. There is no mass, mass effect, shift of midline structures or acute hemorrhage. No evidence of acute infarction. There is moderate symmetric white matter low-attenuation unchanged and may be due to chronic ischemic microvascular disease versus post treatment changes this patient with known lung cancer. Small lacune infarct over the left internal capsule. Vascular: No hyperdense vessel or unexpected calcification. Skull: Normal. Negative for fracture or focal lesion. Sinuses/Orbits: No acute finding. Other: None. IMPRESSION: No acute intracranial findings. Stable white matter low-attenuation likely chronic ischemic microvascular disease, although may be related to patient's treatment for lung cancer. Small old left-sided lacunar infarct. Electronically Signed   By: Marin Olp M.D.   On: 11/20/2016 13:49   Ct Angio Chest Pe W And/or Wo Contrast  Result Date: 11/27/2016 CLINICAL DATA:  Fall.  Chest pain.  Evaluate for pulmonary embolus. EXAM: CT ANGIOGRAPHY CHEST WITH CONTRAST TECHNIQUE: Multidetector CT imaging of the chest was performed using the standard protocol during bolus administration of intravenous contrast. Multiplanar CT image reconstructions and MIPs were obtained to evaluate the vascular anatomy. CONTRAST:  70 cc of Isovue 370 COMPARISON:  PET-CT from 07/07/2016 FINDINGS: Cardiovascular: Normal heart size. Aortic atherosclerosis. Calcifications within the LAD, RCA and left circumflex coronary artery noted. No pericardial effusion. The main pulmonary artery is patent. No saddle embolus. Beyond the level of the main pulmonary artery is exam detail is diminished secondary to motion artifact. No lobar pulmonary artery filling defects identified. The upper lobe segmental pulmonary arteries appear patent. Diminished detail within the lower lobe pulmonary arteries due to motion artifact. Mediastinum/Nodes: The trachea appears patent and is midline. Normal appearance of the esophagus.  Index right paratracheal node measures 6 mm, image 26 of series 4. Previously this lymph node measured 1.4 cm. Index right hilar node measures 1 cm, image 31 of series 4. Previously 1.3 cm. New left paratracheal lymph node measures 1 cm, image 25 of series 4. Lungs/Pleura: No pleural effusion. Right upper lobe paramediastinal mass measures 2.8 by 2.1 cm, image 25 of series 6. Previous 4.9 x 3.3 cm. There is a new area of masslike consolidation within the posteromedial right lower lobe measuring 2.4 x 3.8 cm, image 148 of series 5. Central area of cavitation is identified, image 48 of series 6. Masslike area of architectural distortion within the left upper lobe is new from previous exam, image number 21 of series 6. Upper Abdomen: No change in small nodule involving the right  adrenal gland measuring 1 cm, image 77 of series 4. Area of low attenuation within the lateral aspect of the right lobe of liver is unchanged from 07/07/2016 measuring 3.2 cm. Musculoskeletal: There are no aggressive lytic or sclerotic bone lesions identified. Degenerative disc disease is noted throughout the thoracic spine. Review of the MIP images confirms the above findings. IMPRESSION: 1. Exam detail is diminished due to respiratory motion artifact. Within this limitation no evidence for clinically significant acute pulmonary embolus identified. 2. The original index lesion within the paramediastinal right upper lobe is decreased in size from 07/07/2016. 3. There is a new partially cavitary lesion within the posteromedial right lower lobe which is indeterminate. Cannot rule out progression of disease. Recommend further evaluation with PET-CT. 4. Masslike area of architectural distortion in the left upper lobe is nonspecific but may reflect changes secondary to external beam radiation. Underlying tumor involvement cannot be excluded. 5. Decrease in size of right paratracheal lymph node with increase in size of left paratracheal node. 6.  Aortic Atherosclerosis (ICD10-I70.0). Multi vessel coronary artery calcification noted. Electronically Signed   By: Kerby Moors M.D.   On: 11/27/2016 15:15   US Carotid Bilateral  Result Date: 11/29/2016 CLINICAL DATA:  Syncope.  Vertigo.  History of lung cancer. EXAM: BILATERAL CAROTID DUPLEX ULTRASOUND TECHNIQUE: Pearline Cables scale imaging, color Doppler and duplex ultrasound were performed of bilateral carotid and vertebral arteries in the neck. COMPARISON:  06/05/2009 FINDINGS: Criteria: Quantification of carotid stenosis is based on velocity parameters that correlate the residual internal carotid diameter with NASCET-based stenosis levels, using the diameter of the distal internal carotid lumen as the denominator for stenosis measurement. The following velocity measurements were obtained: RIGHT ICA:  113 cm/sec CCA:  59 cm/sec SYSTOLIC ICA/CCA RATIO:  1.9 DIASTOLIC ICA/CCA RATIO:  1.8 ECA:  64 cm/sec LEFT ICA:  82 cm/sec CCA:  56 cm/sec SYSTOLIC ICA/CCA RATIO:  1.5 DIASTOLIC ICA/CCA RATIO:  2.2 ECA:  72 cm/sec RIGHT CAROTID ARTERY: There is prominent calcified plaque in the bulb. Low resistance internal carotid Doppler pattern is preserved. RIGHT VERTEBRAL ARTERY:  Antegrade. LEFT CAROTID ARTERY: Moderate irregular plaque with some calcification in the bulb. Low resistance internal carotid Doppler pattern. LEFT VERTEBRAL ARTERY:  Antegrade. IMPRESSION: There is less than 50% stenosis in the right and left internal carotid arteries. There is plaque in both bulbs right greater than left. Electronically Signed   By: Marybelle Killings M.D.   On: 11/29/2016 10:10       Subjective:   Discharge Exam: Vitals:   12/05/16 2151 12/06/16 0426  BP: (!) 144/77 139/67  Pulse: 80 (!) 44  Resp: 16 16  Temp: 98.2 F (36.8 C) 97.9 F (36.6 C)   Vitals:   12/05/16 2100 12/05/16 2151 12/06/16 0426 12/06/16 0756  BP:  (!) 144/77 139/67   Pulse: 84 80 (!) 44   Resp: 16 16 16    Temp:  98.2 F (36.8 C) 97.9 F (36.6  C)   TempSrc:  Oral Oral   SpO2: 94% 95% 96% 96%  Weight:      Height:        General: Pt is alert, awake, not in acute distress Cardiovascular: RRR, S1/S2 +, no rubs, no gallops Respiratory: CTA bilaterally, no wheezing, no rhonchi Abdominal: Soft, NT, ND, bowel sounds + Extremities: no edema, no cyanosis    The results of significant diagnostics from this hospitalization (including imaging, microbiology, ancillary and laboratory) are listed below for reference.     Microbiology: Recent  Results (from the past 240 hour(s))  Urine Culture     Status: None   Collection Time: 11/27/16  3:35 PM  Result Value Ref Range Status   Specimen Description URINE, CLEAN CATCH  Final   Special Requests NONE  Final   Culture   Final    NO GROWTH Performed at [facility], [address], [place], [place] [address]    Report Status 11/29/2016 FINAL  Final  Blood Culture (routine x 2)     Status: None   Collection Time: 11/27/16  3:46 PM  Result Value Ref Range Status   Specimen Description BLOOD  Final   Special Requests NONE  Final   Culture NO GROWTH 5 DAYS  Final   Report Status 12/02/2016 FINAL  Final  Blood Culture (routine x 2)     Status: None   Collection Time: 11/27/16  3:48 PM  Result Value Ref Range Status   Specimen Description BLOOD  Final   Special Requests NONE  Final   Culture NO GROWTH 5 DAYS  Final   Report Status 12/02/2016 FINAL  Final     Labs: BNP (last 3 results) No results for input(s): BNP in the last 8760 hours. Basic Metabolic Panel:  Recent Labs Lab 11/30/16 0620 12/01/16 0628 12/03/16 0645 12/06/16 0748  NA 131* 134* 137 139  K 3.9 3.8 3.4* 3.5  CL 98* 99* 97* 105  CO2 25 26 28 28   GLUCOSE 112* 105* 115* 97  BUN 9 11 8 9   CREATININE 0.78 0.74 0.74 0.88  CALCIUM 8.6* 8.6* 8.8* 9.0   Liver Function Tests: No results for input(s): AST, ALT, ALKPHOS, BILITOT, PROT, ALBUMIN in the last 168 hours. No results for input(s): LIPASE,  AMYLASE in the last 168 hours. No results for input(s): AMMONIA in the last 168 hours. CBC:  Recent Labs Lab 11/30/16 0620 12/01/16 0628 12/01/16 2141 12/02/16 0527  WBC 7.2 7.5  --  6.4  HGB 8.0* 7.6* 9.3* 9.1*  HCT 23.0* 22.6* 26.6* 26.6*  MCV 86.5 86.6  --  86.1  PLT 304 330  --  315   Cardiac Enzymes: No results for input(s): CKTOTAL, CKMB, CKMBINDEX, TROPONINI in the last 168 hours. BNP: Invalid input(s): POCBNP CBG:  Recent Labs Lab 12/01/16 2027  GLUCAP 122*   D-Dimer No results for input(s): DDIMER in the last 72 hours. Hgb A1c No results for input(s): HGBA1C in the last 72 hours. Lipid Profile No results for input(s): CHOL, HDL, LDLCALC, TRIG, CHOLHDL, LDLDIRECT in the last 72 hours. Thyroid function studies No results for input(s): TSH, T4TOTAL, T3FREE, THYROIDAB in the last 72 hours.  Invalid input(s): FREET3 Anemia work up No results for input(s): VITAMINB12, FOLATE, FERRITIN, TIBC, IRON, RETICCTPCT in the last 72 hours. Urinalysis    Component Value Date/Time   COLORURINE YELLOW 11/27/2016 1400   APPEARANCEUR CLEAR 11/27/2016 1400   APPEARANCEUR Clear 09/10/2016 1114   LABSPEC 1.010 11/27/2016 1400   PHURINE 6.0 11/27/2016 1400   GLUCOSEU NEGATIVE 11/27/2016 1400   HGBUR NEGATIVE 11/27/2016 1400   BILIRUBINUR NEGATIVE 11/27/2016 1400   BILIRUBINUR Negative 09/10/2016 1114   KETONESUR NEGATIVE 11/27/2016 1400   PROTEINUR NEGATIVE 11/27/2016 1400   UROBILINOGEN 0.2 10/14/2011 1132   NITRITE NEGATIVE 11/27/2016 1400   LEUKOCYTESUR NEGATIVE 11/27/2016 1400   LEUKOCYTESUR Negative 09/10/2016 1114   Sepsis Labs Invalid input(s): PROCALCITONIN,  WBC,  LACTICIDVEN Microbiology Recent Results (from the past 240 hour(s))  Urine Culture     Status:  None   Collection Time: 11/27/16  3:35 PM  Result Value Ref Range Status   Specimen Description URINE, CLEAN CATCH  Final   Special Requests NONE  Final   Culture   Final    NO GROWTH Performed at  [facility], [address] [place], [place] [address]    Report Status 11/29/2016 FINAL  Final  Blood Culture (routine x 2)     Status: None   Collection Time: 11/27/16  3:46 PM  Result Value Ref Range Status   Specimen Description BLOOD  Final   Special Requests NONE  Final   Culture NO GROWTH 5 DAYS  Final   Report Status 12/02/2016 FINAL  Final  Blood Culture (routine x 2)     Status: None   Collection Time: 11/27/16  3:48 PM  Result Value Ref Range Status   Specimen Description BLOOD  Final   Special Requests NONE  Final   Culture NO GROWTH 5 DAYS  Final   Report Status 12/02/2016 FINAL  Final     Time coordinating discharge: Over 30 minutes  SIGNED:   Orvan Falconer, MD FACP [facility] 12/06/2016, 2:04 PM   If 7PM-7AM, please contact night-coverage [email or web address] Password [email or web address]

## 2016-12-06 NOTE — Progress Notes (Addendum)
Pt port was heparin flushed and de accessed, pt tolerated well.  Reviewed discharge instructions with pt and family at bedside.  Pt family is to transport pt to [facility] with [facility] and [place] at facility and answered all questions.

## 2016-12-06 NOTE — Care Management Important Message (Signed)
Important Message  Patient Details  Name: Alexandria Chen MRN: [id number] Date of Birth: Mar 20, 1935   Medicare Important Message Given:  Yes    Geronimo Diliberto, Chauncey [place], RN 12/06/2016, 2:09 PM

## 2016-12-06 NOTE — Clinical Social Work Placement (Signed)
   CLINICAL SOCIAL WORK PLACEMENT  NOTE  Date:  12/06/2016  Patient Details  Name: Alexandria Chen MRN: [id number] Date of Birth: [birth year]  Clinical Social Work is seeking post-discharge placement for this patient at the [place] level of care (*CSW will initial, date and re-position this form in  chart as items are completed):  Yes   Patient/family provided with [facility]'s list of facilities offering this level of care within the geographic area requested by the patient (or if unable, by the patient's family).  Yes   Patient/family informed of their freedom to choose among providers that offer the needed level of care, that participate in Medicare, Medicaid or managed care program needed by the patient, have an available bed and are willing to accept the patient.  Yes   Patient/family informed of [facility]'s ownership interest in [facility] and [facility], as well as of the fact that they are under no obligation to receive care at these facilities.  PASRR submitted to E[facility] on 12/01/16     PASRR number received on       Existing PASRR number confirmed on       FL2 transmitted to all facilities in geographic area requested by pt/family on 12/01/16     FL2 transmitted to all facilities within larger geographic area on       Patient informed that his/her managed care company has contracts with or will negotiate with certain facilities, including the following:        Yes   Patient/family informed of bed offers received.  Patient chooses bed at [facility]     Physician recommends and patient chooses bed at      Patient to be transferred to [facility] on 12/06/16.  Patient to be transferred to facility by daughters     Patient family notified on 12/06/16 of transfer.  Name of family member notified:  all four of patient's daughters.      PHYSICIAN       Additional Comment:  LCSW advised  Mardene Celeste at [facility] that patient was discharging. LCSW sent clinicals to facility. LCSW signing off.   _______________________________________________ Ihor Gully, LCSW 12/06/2016, 3:42 PM

## 2016-12-14 ENCOUNTER — Ambulatory Visit (HOSPITAL_COMMUNITY)
Admission: RE | Admit: 2016-12-14 | Discharge: 2016-12-14 | Disposition: A | Discharge status: Home / Self Care | Payer: Medicare Other | Source: Ambulatory Visit | Attending: Oncology | Admitting: Oncology

## 2016-12-14 DIAGNOSIS — I2510 Atherosclerotic heart disease of native coronary artery without angina pectoris: Secondary | ICD-10-CM | POA: Diagnosis not present

## 2016-12-14 DIAGNOSIS — I517 Cardiomegaly: Secondary | ICD-10-CM | POA: Diagnosis not present

## 2016-12-14 DIAGNOSIS — J439 Emphysema, unspecified: Secondary | ICD-10-CM | POA: Insufficient documentation

## 2016-12-14 DIAGNOSIS — I700 Atherosclerosis of aorta: Secondary | ICD-10-CM | POA: Diagnosis not present

## 2016-12-14 DIAGNOSIS — K7689 Other specified diseases of liver: Secondary | ICD-10-CM | POA: Diagnosis not present

## 2016-12-14 DIAGNOSIS — R918 Other nonspecific abnormal finding of lung field: Secondary | ICD-10-CM | POA: Diagnosis not present

## 2016-12-14 DIAGNOSIS — C3491 Malignant neoplasm of unspecified part of right bronchus or lung: Secondary | ICD-10-CM | POA: Diagnosis not present

## 2016-12-14 MED ORDER — IOPAMIDOL (ISOVUE-300) INJECTION 61%
100.0000 mL | Freq: Once | INTRAVENOUS | Status: AC | PRN
  Administered 2016-12-14: 100 mL via INTRAVENOUS

## 2016-12-17 ENCOUNTER — Ambulatory Visit (HOSPITAL_COMMUNITY): Payer: Self-pay

## 2016-12-17 ENCOUNTER — Telehealth: Payer: Self-pay | Admitting: Pharmacist

## 2016-12-17 NOTE — Telephone Encounter (Signed)
Called to check on patient.  Spoke to her daughter [name].  Alexandria Chen is currently in rehab center at [facility].  They are checking INR.   They are planning for her to come home 12/24/16.  Appt made for INR recheck for 12/31/16.

## 2016-12-21 DIAGNOSIS — R590 Localized enlarged lymph nodes: Secondary | ICD-10-CM | POA: Diagnosis not present

## 2016-12-21 DIAGNOSIS — K769 Liver disease, unspecified: Secondary | ICD-10-CM | POA: Diagnosis not present

## 2016-12-21 DIAGNOSIS — I4891 Unspecified atrial fibrillation: Secondary | ICD-10-CM | POA: Diagnosis not present

## 2016-12-21 DIAGNOSIS — J449 Chronic obstructive pulmonary disease, unspecified: Secondary | ICD-10-CM | POA: Diagnosis not present

## 2016-12-21 DIAGNOSIS — I10 Essential (primary) hypertension: Secondary | ICD-10-CM | POA: Diagnosis not present

## 2016-12-21 DIAGNOSIS — C3411 Malignant neoplasm of upper lobe, right bronchus or lung: Secondary | ICD-10-CM | POA: Diagnosis not present

## 2016-12-23 ENCOUNTER — Encounter (HOSPITAL_COMMUNITY): Payer: Self-pay | Admitting: Oncology

## 2016-12-23 ENCOUNTER — Encounter (HOSPITAL_COMMUNITY): Payer: Medicare Other | Attending: Oncology | Admitting: Oncology

## 2016-12-23 VITALS — BP 159/90 | HR 80 | Temp 97.5°F | Resp 20 | Wt 133.0 lb

## 2016-12-23 DIAGNOSIS — C3411 Malignant neoplasm of upper lobe, right bronchus or lung: Secondary | ICD-10-CM

## 2016-12-23 DIAGNOSIS — C3491 Malignant neoplasm of unspecified part of right bronchus or lung: Secondary | ICD-10-CM

## 2016-12-23 DIAGNOSIS — R918 Other nonspecific abnormal finding of lung field: Secondary | ICD-10-CM | POA: Insufficient documentation

## 2016-12-23 NOTE — Progress Notes (Signed)
Alexandria Chen, Alexandria Chen [address]  No diagnosis found.  CURRENT THERAPY:  Weekly carboplatin/paclitaxel with XRT.  INTERVAL HISTORY: Alexandria Chen 82 y.o. 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    Squamous cell carcinoma of right lung (HCC)   06/25/2016 Imaging    CT chest- 1. 4.9 x 3.3 cm mass in the medial aspect of the right upper lobe which demonstrates evidence of potential direct mediastinal invasion, and pathologically enlarged right hilar and right paratracheal lymph nodes which suggest nodal metastatic disease. These findings are highly concerning for primary bronchogenic carcinoma and further evaluation with PET-CT is strongly recommended in the near future for further diagnostic and staging purposes. 2. There is also nodularity in the adrenal glands bilaterally, which could reflect additional sites of metastatic disease. Attention at time of forthcoming PET-CT is recommended. 3. Indeterminate lesion in the right lobe of the liver, favored to represent a cavernous hemangioma. Should this lesion prove to be hypermetabolic on PET imaging, further characterization with MRI of the abdomen with and without IV gadolinium could provide definitive characterization. 4. Aortic atherosclerosis, in addition to left main and 2 vessel coronary artery disease. Assessment for potential risk factor modification, dietary therapy or pharmacologic therapy may be warranted, if clinically indicated. 5. Mild diffuse bronchial wall thickening with mild centrilobular and paraseptal emphysema; imaging findings suggestive of underlying COPD. 6. Additional incidental findings, as above.      07/06/2016 Imaging    CT head wo contrast- 1. No acute finding. 2. Advanced chronic microvascular disease.      07/07/2016 PET scan    4.7 cm mass in the medial right upper lobe abutting/invading the mediastinum,  suspicious for primary bronchogenic neoplasm.  Possible mild mediastinal/right hilar nodal metastases, equivocal.  No evidence of distant metastases.  3.6 x 2.3 cm subcapsular lesion in the inferior right liver is non FDG avid, likely reflecting a benign hemangioma when correlating with prior CT.      08/27/2016 Procedure    Needle core biopsy of the anterior right upper lobe mass by interventional radiology      08/30/2016 Pathology Results    Squamous cell carcinoma      10/04/2016 -  Chemotherapy    Carboplatin/Paclitaxel weekly with XRT       10/04/2016 -  Radiation Therapy    XRT in [place], [place].  Final XRT planned for 11/17/2016      12/14/2016 Imaging    CT C/A/P: IMPRESSION: 1. Previously noted right upper lobe mass is smaller than prior PET-CT, but similar in size to recent chest CT 11/27/2016. Previously noted mass like area of airspace consolidation in the right lower lobe is slightly smaller than the recent prior examination, but remains of uncertain etiology and significance. Other patchy areas of ground-glass attenuation and septal thickening in the upper lungs bilaterally are slightly improved on the left and increased on the right, and favored to reflect evolving postradiation changes, but could alternatively be infectious or inflammatory in etiology. 2. No evidence of new metastatic disease to the abdomen or pelvis. 3. Cardiomegaly. 4. Mild diffuse bronchial wall thickening with mild centrilobular and paraseptal emphysema; imaging findings suggestive of underlying COPD. 5. Aortic atherosclerosis, in addition to left main and 3 vessel coronary artery disease. 6. Additional incidental findings, as above. Aortic Atherosclerosis (ICD10-I70.0) and Emphysema (ICD10-J43.9).        Patient presents today with her  2 daughters for continued follow-up and review of her scan results. Patient was hospitalized from 11/27/2016 through 12/06/2016 for near syncope and  having symptoms suggestive of sepsis. She was having multiple symptoms of loss of consciousness. She was found to be hypotensive. Chest x-ray on 12/02/2016 demonstrated a small developing pneumonia in the left upper lobe therefore she was placed on Zosyn and IV Levaquin. She was discharged on by mouth Levaquin to rehabilitation, where she currently is staying. She denies any chest pain, shortness breath, abdominal pain, nausea, vomiting, diarrhea, fevers/chills.  Review of Systems  Constitutional: Negative for chills, fever, malaise/fatigue and weight loss.  HENT: Negative.   Eyes: Negative.   Respiratory: Negative.  Negative for cough.   Cardiovascular: Negative.  Negative for chest pain.  Gastrointestinal: Negative for blood in stool, constipation, diarrhea, melena, nausea and vomiting.  Genitourinary: Negative.   Musculoskeletal: Negative.   Skin: Negative.   Neurological: Negative for dizziness and weakness.  Endo/Heme/Allergies: Negative.   Psychiatric/Behavioral: Negative.     Past Medical History:  Diagnosis Date  . Alcohol abuse, in remission   . Anxiety   . Atrial fibrillation ([place])   . Depression   . Dyspnea    walking distances   . Dysrhythmia   . History of bronchitis   . HTN (hypertension)   . Mass of right lung 07/13/2016  . Osteopenia   . Squamous cell carcinoma of right lung ([place]) 07/13/2016    Past Surgical History:  Procedure Laterality Date  . IR FLUORO GUIDE PORT INSERTION RIGHT  09/17/2016  . IR US GUIDE VASC ACCESS RIGHT  09/17/2016    Family History  Problem Relation Age of Onset  . Anxiety disorder Mother   . Dementia Mother   . Hypertension Mother   . Alcohol abuse Father   . Arthritis Father   . Alcohol abuse Brother   . ADD / ADHD Neg Hx   . Drug abuse Neg Hx   . Bipolar disorder Neg Hx   . Depression Neg Hx   . OCD Neg Hx   . Paranoid behavior Neg Hx   . Schizophrenia Neg Hx   . Seizures Neg Hx   . Sexual abuse Neg Hx   . Physical abuse  Neg Hx     Social History   Social History  . Marital status: Widowed    Spouse name: N/A  . Number of children: N/A  . Years of education: N/A   Occupational History  . Retired    Social History Main Topics  . Smoking status: Former Smoker    Packs/day: 2.00    Years: 60.00    Types: Cigarettes    Start date: 05/03/1956    Quit date: 07/15/2016  . Smokeless tobacco: Never Used  . Alcohol use No     Comment: Patient reports no alcohol use since 05/24/2016.  . Drug use: No  . Sexual activity: No   Other Topics Concern  . Not on file   Social History Narrative  . No narrative on file     PHYSICAL EXAMINATION  ECOG PERFORMANCE STATUS: 1 - Symptomatic but completely ambulatory  Vitals:   12/23/16 1420  BP: (!) 159/90  Pulse: 80  Resp: 20  Temp: (!) 97.5 F (36.4 C)  SpO2: 100%    Constitutional: Well-developed, well-nourished, and in no distress.   HENT:  Head: Normocephalic and atraumatic.  Mouth/Throat: No oropharyngeal exudate. Mucosa moist. Eyes: Pupils are equal, round, and reactive to light. Conjunctivae are normal.  No scleral icterus.  Neck: Normal range of motion. Neck supple. No JVD present.  Cardiovascular: Irregularly irregular.  Exam reveals no gallop and no friction rub.   No murmur heard. Pulmonary/Chest: Effort normal and breath sounds normal. No respiratory distress. No wheezes.No rales.  Abdominal: Soft. Bowel sounds are normal. No distension. There is no tenderness. There is no guarding.  Musculoskeletal: No edema or tenderness.  Lymphadenopathy:    No cervical or supraclavicular adenopathy.  Neurological: Alert and oriented to person, place, and time. No cranial nerve deficit.  Skin: Skin is warm and dry. No rash noted. No erythema. No pallor.  Psychiatric: Affect and judgment normal.    LABORATORY DATA: CBC    Component Value Date/Time   WBC 6.4 12/02/2016 0527   RBC 3.09 (L) 12/02/2016 0527   HGB 9.1 (L) 12/02/2016 0527   HGB 9.7  (L) 11/26/2016 1231   HCT 26.6 (L) 12/02/2016 0527   HCT 28.1 (L) 11/26/2016 1231   PLT 315 12/02/2016 0527   PLT 293 11/26/2016 1231   MCV 86.1 12/02/2016 0527   MCV 84 11/26/2016 1231   MCH 29.4 12/02/2016 0527   MCHC 34.2 12/02/2016 0527   RDW 16.2 (H) 12/02/2016 0527   RDW 16.3 (H) 11/26/2016 1231   LYMPHSABS 1.1 11/27/2016 1545   LYMPHSABS 1.1 11/26/2016 1231   MONOABS 1.2 (H) 11/27/2016 1545   EOSABS 0.0 11/27/2016 1545   EOSABS 0.0 11/26/2016 1231   BASOSABS 0.0 11/27/2016 1545   BASOSABS 0.0 11/26/2016 1231      Chemistry      Component Value Date/Time   NA 139 12/06/2016 0748   NA 138 07/19/2016 1519   K 3.5 12/06/2016 0748   CL 105 12/06/2016 0748   CO2 28 12/06/2016 0748   BUN 9 12/06/2016 0748   BUN 9 07/19/2016 1519   CREATININE 0.88 12/06/2016 0748      Component Value Date/Time   CALCIUM 9.0 12/06/2016 0748   ALKPHOS 49 11/21/2016 0701   AST 14 (L) 11/21/2016 0701   ALT 14 11/21/2016 0701   BILITOT 0.7 11/21/2016 0701   BILITOT 0.3 07/19/2016 1519        PENDING LABS:   RADIOGRAPHIC STUDIES:  Dg Chest 2 View  Result Date: 12/02/2016 CLINICAL DATA:  Cough EXAM: CHEST  2 VIEW COMPARISON:  11/20/2016 FINDINGS: Low lung volumes. Right Port-A-Cath remains in place, unchanged. Heart is borderline in size, accentuated by the low volumes and AP nature of the study. Patchy airspace disease in the left upper lobe is new since prior study. No confluent opacity on the right. No visible effusions or acute bony abnormality. IMPRESSION: New patchy left upper lobe airspace disease concerning for pneumonia. Low lung volumes. Electronically Signed   By: Rolm Baptise M.D.   On: 12/02/2016 11:37   Ct Head Wo Contrast  Result Date: 12/02/2016 CLINICAL DATA:  Increased altered mental status since 12/01/2016. Confusion. EXAM: CT HEAD WITHOUT CONTRAST TECHNIQUE: Contiguous axial images were obtained from the base of the skull through the vertex without intravenous  contrast. COMPARISON:  11/27/2016 FINDINGS: Brain: Bilateral cerebral atrophy. Low-attenuation changes throughout the deep white matter consistent with small vessel ischemia. No evidence of acute infarction, hemorrhage, hydrocephalus, extra-axial collection or mass lesion/mass effect. Vascular: Vascular calcifications are present in the internal carotid arteries. Skull: No depressed skull fractures. Sinuses/Orbits: Paranasal sinuses and mastoid air cells are clear. Other: No significant change since previous study. IMPRESSION: No acute intracranial abnormalities. Chronic atrophy and small vessel ischemic changes.  No change since previous study. Electronically Signed   By: Lucienne Capers M.D.   On: 12/02/2016 01:31   Ct Head Wo Contrast  Result Date: 11/27/2016 CLINICAL DATA:  Patient status post fall.  Dizziness. EXAM: CT HEAD WITHOUT CONTRAST TECHNIQUE: Contiguous axial images were obtained from the base of the skull through the vertex without intravenous contrast. COMPARISON:  Brain CT 11/20/2016. FINDINGS: Brain: Periventricular and subcortical white matter hypodensity compatible with chronic microvascular ischemic changes. No evidence for acute cortically based infarct, intracranial hemorrhage, mass lesion or mass-effect. Old left basal ganglia lacunar infarct. Vascular: No hyperdense vessel or unexpected calcification. Skull: Intact. Sinuses/Orbits: Paranasal sinuses are well aerated. Mastoid air cells unremarkable. Other: None. IMPRESSION: No acute intracranial process. Atrophy and chronic microvascular ischemic changes. Electronically Signed   By: Lovey Newcomer M.D.   On: 11/27/2016 15:09   Ct Chest W Contrast  Result Date: 12/14/2016 CLINICAL DATA:  82 year old female with history of non-small cell lung cancer of the right lung. Followup study. EXAM: CT CHEST, ABDOMEN, AND PELVIS WITH CONTRAST TECHNIQUE: Multidetector CT imaging of the chest, abdomen and pelvis was performed following the standard  protocol during bolus administration of intravenous contrast. CONTRAST:  177mL ISOVUE-300 IOPAMIDOL (ISOVUE-300) INJECTION 61% COMPARISON:  Chest CT 11/27/2016.  PET-CT 07/07/2016. FINDINGS: CT CHEST FINDINGS Cardiovascular: Heart size is mildly enlarged. There is no significant pericardial fluid, thickening or pericardial calcification. There is aortic atherosclerosis, as well as atherosclerosis of the great vessels of the mediastinum and the coronary arteries, including calcified atherosclerotic plaque in the left main, left anterior descending and left circumflex coronary arteries. Right internal jugular single-lumen porta cath with tip terminating in the right atrium. Mediastinum/Nodes: No pathologically enlarged mediastinal or hilar lymph nodes. Esophagus is unremarkable in appearance. No axillary lymphadenopathy. Lungs/Pleura: Previously noted right upper lobe mass has decreased in size, currently a 3.1 x 2.0 cm mass in the medial aspect of the right upper lobe abutting the pleura (axial image 18 of series 2). Previously noted area of mass-like airspace consolidation in the medial aspect of the right lower lobe is slightly smaller than the prior examination currently measuring 4.3 x 2.6 cm with a well-defined internal air bronchogram (axial image 71 of series 6). Previously noted area of airspace consolidation in the left upper lobe has become far less confluent, now with extensive areas of residual ground-glass attenuation and septal thickening in its wake. Additional areas of ground-glass attenuation septal thickening are also noted in the posterior aspect of the right upper lobe and in the superior segment of the right lower lobe, favored to be related to prior radiation therapy, although this could alternatively be infectious or inflammatory in etiology. Mild diffuse bronchial wall thickening with mild centrilobular and paraseptal emphysema. Musculoskeletal: There are no aggressive appearing lytic or  blastic lesions noted in the visualized portions of the skeleton. CT ABDOMEN PELVIS FINDINGS Hepatobiliary: 3.7 x 2.1 cm lesion in the periphery of the right lobe of the liver involving portions of segments 5 and 6, similar to prior studies, compatible with a cavernous hemangioma. No new suspicious appearing hepatic lesions are noted. No intra or extrahepatic biliary ductal dilatation. Gallbladder is normal in appearance. Pancreas: No pancreatic mass. No pancreatic ductal dilatation. No pancreatic or peripancreatic fluid or inflammatory changes. Spleen: Unremarkable. Adrenals/Urinary Tract: Subcentimeter low-attenuation lesion in the lower pole the right kidney is too small to definitively characterize, but is statistically likely a cyst. 4.2 cm low-intermediate attenuation (27 HU) lesion in the lower pole of the  left kidney is similar to prior studies, presumably a proteinaceous cyst. Bilateral adrenal glands are normal in appearance. No hydroureteronephrosis. Urinary bladder is normal in appearance. Stomach/Bowel: The appearance of the stomach is normal. There is no pathologic dilatation of small bowel or colon. The appendix is not confidently identified and may be surgically absent. Regardless, there are no inflammatory changes noted adjacent to the cecum to suggest the presence of an acute appendicitis at this time. Vascular/Lymphatic: Aortic atherosclerosis, without evidence of aneurysm or dissection in the abdominal or pelvic vasculature. No lymphadenopathy noted in the abdomen or pelvis. Reproductive: Innumerable coarse calcifications throughout the uterus, presumably related to multifocal fibroids. Ovaries are atrophic. Other: No significant volume of ascites.  No pneumoperitoneum. Musculoskeletal: There are no aggressive appearing lytic or blastic lesions noted in the visualized portions of the skeleton. IMPRESSION: 1. Previously noted right upper lobe mass is smaller than prior PET-CT, but similar in size  to recent chest CT 11/27/2016. Previously noted mass like area of airspace consolidation in the right lower lobe is slightly smaller than the recent prior examination, but remains of uncertain etiology and significance. Other patchy areas of ground-glass attenuation and septal thickening in the upper lungs bilaterally are slightly improved on the left and increased on the right, and favored to reflect evolving postradiation changes, but could alternatively be infectious or inflammatory in etiology. 2. No evidence of new metastatic disease to the abdomen or pelvis. 3. Cardiomegaly. 4. Mild diffuse bronchial wall thickening with mild centrilobular and paraseptal emphysema; imaging findings suggestive of underlying COPD. 5. Aortic atherosclerosis, in addition to left main and 3 vessel coronary artery disease. 6. Additional incidental findings, as above. Aortic Atherosclerosis (ICD10-I70.0) and Emphysema (ICD10-J43.9). Electronically Signed   By: Vinnie Langton M.D.   On: 12/14/2016 14:57   Ct Angio Chest Pe W And/or Wo Contrast  Result Date: 11/27/2016 CLINICAL DATA:  Fall.  Chest pain.  Evaluate for pulmonary embolus. EXAM: CT ANGIOGRAPHY CHEST WITH CONTRAST TECHNIQUE: Multidetector CT imaging of the chest was performed using the standard protocol during bolus administration of intravenous contrast. Multiplanar CT image reconstructions and MIPs were obtained to evaluate the vascular anatomy. CONTRAST:  70 cc of Isovue 370 COMPARISON:  PET-CT from 07/07/2016 FINDINGS: Cardiovascular: Normal heart size. Aortic atherosclerosis. Calcifications within the LAD, RCA and left circumflex coronary artery noted. No pericardial effusion. The main pulmonary artery is patent. No saddle embolus. Beyond the level of the main pulmonary artery is exam detail is diminished secondary to motion artifact. No lobar pulmonary artery filling defects identified. The upper lobe segmental pulmonary arteries appear patent. Diminished detail  within the lower lobe pulmonary arteries due to motion artifact. Mediastinum/Nodes: The trachea appears patent and is midline. Normal appearance of the esophagus. Index right paratracheal node measures 6 mm, image 26 of series 4. Previously this lymph node measured 1.4 cm. Index right hilar node measures 1 cm, image 31 of series 4. Previously 1.3 cm. New left paratracheal lymph node measures 1 cm, image 25 of series 4. Lungs/Pleura: No pleural effusion. Right upper lobe paramediastinal mass measures 2.8 by 2.1 cm, image 25 of series 6. Previous 4.9 x 3.3 cm. There is a new area of masslike consolidation within the posteromedial right lower lobe measuring 2.4 x 3.8 cm, image 148 of series 5. Central area of cavitation is identified, image 48 of series 6. Masslike area of architectural distortion within the left upper lobe is new from previous exam, image number 21 of series 6. Upper Abdomen: No  change in small nodule involving the right adrenal gland measuring 1 cm, image 77 of series 4. Area of low attenuation within the lateral aspect of the right lobe of liver is unchanged from 07/07/2016 measuring 3.2 cm. Musculoskeletal: There are no aggressive lytic or sclerotic bone lesions identified. Degenerative disc disease is noted throughout the thoracic spine. Review of the MIP images confirms the above findings. IMPRESSION: 1. Exam detail is diminished due to respiratory motion artifact. Within this limitation no evidence for clinically significant acute pulmonary embolus identified. 2. The original index lesion within the paramediastinal right upper lobe is decreased in size from 07/07/2016. 3. There is a new partially cavitary lesion within the posteromedial right lower lobe which is indeterminate. Cannot rule out progression of disease. Recommend further evaluation with PET-CT. 4. Masslike area of architectural distortion in the left upper lobe is nonspecific but may reflect changes secondary to external beam  radiation. Underlying tumor involvement cannot be excluded. 5. Decrease in size of right paratracheal lymph node with increase in size of left paratracheal node. 6. Aortic Atherosclerosis (ICD10-I70.0). Multi vessel coronary artery calcification noted. Electronically Signed   By: Kerby Moors M.D.   On: 11/27/2016 15:15   Ct Abdomen Pelvis W Contrast  Result Date: 12/14/2016 CLINICAL DATA:  82 year old female with history of non-small cell lung cancer of the right lung. Followup study. EXAM: CT CHEST, ABDOMEN, AND PELVIS WITH CONTRAST TECHNIQUE: Multidetector CT imaging of the chest, abdomen and pelvis was performed following the standard protocol during bolus administration of intravenous contrast. CONTRAST:  141mL ISOVUE-300 IOPAMIDOL (ISOVUE-300) INJECTION 61% COMPARISON:  Chest CT 11/27/2016.  PET-CT 07/07/2016. FINDINGS: CT CHEST FINDINGS Cardiovascular: Heart size is mildly enlarged. There is no significant pericardial fluid, thickening or pericardial calcification. There is aortic atherosclerosis, as well as atherosclerosis of the great vessels of the mediastinum and the coronary arteries, including calcified atherosclerotic plaque in the left main, left anterior descending and left circumflex coronary arteries. Right internal jugular single-lumen porta cath with tip terminating in the right atrium. Mediastinum/Nodes: No pathologically enlarged mediastinal or hilar lymph nodes. Esophagus is unremarkable in appearance. No axillary lymphadenopathy. Lungs/Pleura: Previously noted right upper lobe mass has decreased in size, currently a 3.1 x 2.0 cm mass in the medial aspect of the right upper lobe abutting the pleura (axial image 18 of series 2). Previously noted area of mass-like airspace consolidation in the medial aspect of the right lower lobe is slightly smaller than the prior examination currently measuring 4.3 x 2.6 cm with a well-defined internal air bronchogram (axial image 71 of series 6).  Previously noted area of airspace consolidation in the left upper lobe has become far less confluent, now with extensive areas of residual ground-glass attenuation and septal thickening in its wake. Additional areas of ground-glass attenuation septal thickening are also noted in the posterior aspect of the right upper lobe and in the superior segment of the right lower lobe, favored to be related to prior radiation therapy, although this could alternatively be infectious or inflammatory in etiology. Mild diffuse bronchial wall thickening with mild centrilobular and paraseptal emphysema. Musculoskeletal: There are no aggressive appearing lytic or blastic lesions noted in the visualized portions of the skeleton. CT ABDOMEN PELVIS FINDINGS Hepatobiliary: 3.7 x 2.1 cm lesion in the periphery of the right lobe of the liver involving portions of segments 5 and 6, similar to prior studies, compatible with a cavernous hemangioma. No new suspicious appearing hepatic lesions are noted. No intra or extrahepatic biliary ductal dilatation.  Gallbladder is normal in appearance. Pancreas: No pancreatic mass. No pancreatic ductal dilatation. No pancreatic or peripancreatic fluid or inflammatory changes. Spleen: Unremarkable. Adrenals/Urinary Tract: Subcentimeter low-attenuation lesion in the lower pole the right kidney is too small to definitively characterize, but is statistically likely a cyst. 4.2 cm low-intermediate attenuation (27 HU) lesion in the lower pole of the left kidney is similar to prior studies, presumably a proteinaceous cyst. Bilateral adrenal glands are normal in appearance. No hydroureteronephrosis. Urinary bladder is normal in appearance. Stomach/Bowel: The appearance of the stomach is normal. There is no pathologic dilatation of small bowel or colon. The appendix is not confidently identified and may be surgically absent. Regardless, there are no inflammatory changes noted adjacent to the cecum to suggest the  presence of an acute appendicitis at this time. Vascular/Lymphatic: Aortic atherosclerosis, without evidence of aneurysm or dissection in the abdominal or pelvic vasculature. No lymphadenopathy noted in the abdomen or pelvis. Reproductive: Innumerable coarse calcifications throughout the uterus, presumably related to multifocal fibroids. Ovaries are atrophic. Other: No significant volume of ascites.  No pneumoperitoneum. Musculoskeletal: There are no aggressive appearing lytic or blastic lesions noted in the visualized portions of the skeleton. IMPRESSION: 1. Previously noted right upper lobe mass is smaller than prior PET-CT, but similar in size to recent chest CT 11/27/2016. Previously noted mass like area of airspace consolidation in the right lower lobe is slightly smaller than the recent prior examination, but remains of uncertain etiology and significance. Other patchy areas of ground-glass attenuation and septal thickening in the upper lungs bilaterally are slightly improved on the left and increased on the right, and favored to reflect evolving postradiation changes, but could alternatively be infectious or inflammatory in etiology. 2. No evidence of new metastatic disease to the abdomen or pelvis. 3. Cardiomegaly. 4. Mild diffuse bronchial wall thickening with mild centrilobular and paraseptal emphysema; imaging findings suggestive of underlying COPD. 5. Aortic atherosclerosis, in addition to left main and 3 vessel coronary artery disease. 6. Additional incidental findings, as above. Aortic Atherosclerosis (ICD10-I70.0) and Emphysema (ICD10-J43.9). Electronically Signed   By: Vinnie Langton M.D.   On: 12/14/2016 14:57   US Carotid Bilateral  Result Date: 11/29/2016 CLINICAL DATA:  Syncope.  Vertigo.  History of lung cancer. EXAM: BILATERAL CAROTID DUPLEX ULTRASOUND TECHNIQUE: Pearline Cables scale imaging, color Doppler and duplex ultrasound were performed of bilateral carotid and vertebral arteries in the neck.  COMPARISON:  06/05/2009 FINDINGS: Criteria: Quantification of carotid stenosis is based on velocity parameters that correlate the residual internal carotid diameter with NASCET-based stenosis levels, using the diameter of the distal internal carotid lumen as the denominator for stenosis measurement. The following velocity measurements were obtained: RIGHT ICA:  113 cm/sec CCA:  59 cm/sec SYSTOLIC ICA/CCA RATIO:  1.9 DIASTOLIC ICA/CCA RATIO:  1.8 ECA:  64 cm/sec LEFT ICA:  82 cm/sec CCA:  56 cm/sec SYSTOLIC ICA/CCA RATIO:  1.5 DIASTOLIC ICA/CCA RATIO:  2.2 ECA:  72 cm/sec RIGHT CAROTID ARTERY: There is prominent calcified plaque in the bulb. Low resistance internal carotid Doppler pattern is preserved. RIGHT VERTEBRAL ARTERY:  Antegrade. LEFT CAROTID ARTERY: Moderate irregular plaque with some calcification in the bulb. Low resistance internal carotid Doppler pattern. LEFT VERTEBRAL ARTERY:  Antegrade. IMPRESSION: There is less than 50% stenosis in the right and left internal carotid arteries. There is plaque in both bulbs right greater than left. Electronically Signed   By: Marybelle Killings M.D.   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 Completed 6 cycles of weekly carbo/taxol on 11/15/16. Completed concurrent XRT on 11/17/2016.    PLAN: -I have reviewed her recent restaging scans in detail with her and her daughter today. Her primary lung mass has shrunk some and is currently 3.1 x 2.0 cm mass in the RUL. I have discussed her CT results with her radiation oncologist Dr. Quitman Livings. We are both concerned about the new RLL masslike consolidation. I will make a referral for her to see Dr.Gerhardt for EBUS and possible biopsy of the RLL mass to see whether this is post-radiation changes vs. malignancy vs. Infection vs. Inflammation.  -RTC in 4 weeks for follow up. IF the RLL mass is not malignant then I will consider  placing her on maintenance therapy with imfinzi.   All questions were answered. The patient knows to call the clinic with any problems, questions or concerns. We can certainly see the patient much sooner if necessary.  This note is electronically signed by: Twana First, MD 12/23/2016 2:44 PM

## 2016-12-29 ENCOUNTER — Encounter: Payer: Self-pay | Admitting: Family Medicine

## 2016-12-29 ENCOUNTER — Ambulatory Visit (INDEPENDENT_AMBULATORY_CARE_PROVIDER_SITE_OTHER): Payer: Medicare Other

## 2016-12-29 ENCOUNTER — Ambulatory Visit (INDEPENDENT_AMBULATORY_CARE_PROVIDER_SITE_OTHER): Payer: Medicare Other | Admitting: Family Medicine

## 2016-12-29 VITALS — BP 122/71 | HR 80 | Temp 97.9°F | Ht 62.0 in | Wt 131.0 lb

## 2016-12-29 DIAGNOSIS — G629 Polyneuropathy, unspecified: Secondary | ICD-10-CM | POA: Diagnosis not present

## 2016-12-29 DIAGNOSIS — R413 Other amnesia: Secondary | ICD-10-CM | POA: Diagnosis not present

## 2016-12-29 DIAGNOSIS — Z9181 History of falling: Secondary | ICD-10-CM | POA: Diagnosis not present

## 2016-12-29 DIAGNOSIS — I4891 Unspecified atrial fibrillation: Secondary | ICD-10-CM | POA: Diagnosis not present

## 2016-12-29 DIAGNOSIS — Z8673 Personal history of transient ischemic attack (TIA), and cerebral infarction without residual deficits: Secondary | ICD-10-CM | POA: Diagnosis not present

## 2016-12-29 DIAGNOSIS — Z7901 Long term (current) use of anticoagulants: Secondary | ICD-10-CM | POA: Diagnosis not present

## 2016-12-29 DIAGNOSIS — F419 Anxiety disorder, unspecified: Secondary | ICD-10-CM | POA: Diagnosis not present

## 2016-12-29 DIAGNOSIS — I10 Essential (primary) hypertension: Secondary | ICD-10-CM | POA: Diagnosis not present

## 2016-12-29 DIAGNOSIS — C3490 Malignant neoplasm of unspecified part of unspecified bronchus or lung: Secondary | ICD-10-CM | POA: Diagnosis not present

## 2016-12-29 DIAGNOSIS — J189 Pneumonia, unspecified organism: Secondary | ICD-10-CM

## 2016-12-29 DIAGNOSIS — E039 Hypothyroidism, unspecified: Secondary | ICD-10-CM | POA: Diagnosis not present

## 2016-12-29 DIAGNOSIS — J440 Chronic obstructive pulmonary disease with acute lower respiratory infection: Secondary | ICD-10-CM | POA: Diagnosis not present

## 2016-12-29 DIAGNOSIS — Z87891 Personal history of nicotine dependence: Secondary | ICD-10-CM | POA: Diagnosis not present

## 2016-12-29 DIAGNOSIS — F329 Major depressive disorder, single episode, unspecified: Secondary | ICD-10-CM | POA: Diagnosis not present

## 2016-12-29 DIAGNOSIS — E785 Hyperlipidemia, unspecified: Secondary | ICD-10-CM | POA: Diagnosis not present

## 2016-12-29 MED ORDER — HYDROCODONE-HOMATROPINE 5-1.5 MG/5ML PO SYRP: 5.0000 mL | ORAL_SOLUTION | Freq: Three times a day (TID) | ORAL | 0 refills | Status: DC | PRN

## 2016-12-29 MED ORDER — LEVOFLOXACIN 250 MG PO TABS
250.0000 mg | ORAL_TABLET | Freq: Every day | ORAL | 0 refills | Status: DC
Start: 2016-12-29 — End: 2017-01-11

## 2016-12-29 NOTE — Progress Notes (Signed)
Chief Complaint  Patient presents with  . Cough    pt here today c/o cough and congestion    HPI  Patient presents today for Follow-up of recent pneumonia and treatment for her lung cancer. She recently was admitted to [facility] for symptoms suggestive of sepsis. She had just a week or 2 before that time finished treatment for radiation and chemotherapy of lung cancer. She was also noted to be falling a lot at home. Therefore at the end of her hospital stay she was discharged to skilled nursing. She just went home from skilled nursing about 5 days ago. Since that time she is noted increasing cough. She says she can get around the house okay without getting short of breath. Daughter who is with her today named [name] says that she seems weak and the cough seems loose. There has been no fever chills or sweats at this time. There is concern that she recently began treatment for atrial fibrillation using anticoagulants. Hospital discharge summary of 12/06/2016 reviewed in detail. Of note is that her chest x-ray of 8-18 showed patchy left upper lobe airspace disease concerning for pneumonia. During her hospital stay she also has had a CT angiogram of the chest for pulmonary embolism. Although no PE was identified, there was some motion artifact. Additionally there was a new partially cavitary lesion in the posterior medial right lower lobe. There is also mass with architectural distortion of the left upper lobe that may have actually been secondary to the radiation treatments.  PMH: Smoking status noted ROS: Review of Systems  Constitutional: Positive for fatigue. Negative for activity change, appetite change and fever.  HENT:   Positive for congestion. Negative for rhinorrhea and sore throat.   Eyes: Negative for visual disturbance.  Respiratory: Positive for cough. Negative for shortness of breath.   Cardiovascular: Negative for chest pain and palpitations.  Gastrointestinal: Negative for  abdominal pain, diarrhea and nausea.  Genitourinary: Negative for dysuria.   Musculoskeletal: Positive for gait problem. Negative for myalgias.  Neurological: Positive for gait problem.   Review of Systems  Constitutional: Positive for fatigue. Negative for activity change, appetite change and fever.  HENT: Positive for congestion. Negative for rhinorrhea and sore throat.   Eyes: Negative for visual disturbance.  Respiratory: Positive for cough. Negative for shortness of breath.   Cardiovascular: Negative for chest pain and palpitations.  Gastrointestinal: Negative for abdominal pain, diarrhea and nausea.  Genitourinary: Negative for dysuria.  Musculoskeletal: Positive for gait problem. Negative for myalgias.    Objective: BP 122/71   Pulse 80   Temp 97.9 F (36.6 C) (Oral)   Ht 5\' 2"  (1.575 m)   Wt 131 lb (59.4 kg)   BMI 23.96 kg/m  Gen: NAD, alert, cooperative with exam HEENT: NCAT, EOMI, PERRL CV: RRR, good S1/S2, no murmur Resp: The right middle lung field has scattered rhonchi with few rales noted. This seems to be in the area of the cavitary lesion noted on CT.  Abd: SNTND, BS present, no guarding or organomegaly Ext: No edema, warm Neuro: Alert and oriented, No gross deficits  Assessment and plan:  1. Pneumonia due to infectious organism, unspecified laterality, unspecified part of lung     Meds ordered this encounter  Medications  . levofloxacin (LEVAQUIN) 250 MG tablet    Sig: Take 1 tablet (250 mg total) by mouth daily.    Dispense:  10 tablet    Refill:  0  . HYDROcodone-homatropine (HYCODAN) 5-1.5 MG/5ML syrup  Sig: Take 5 mLs by mouth every 8 (eight) hours as needed for cough.    Dispense:  120 mL    Refill:  0    Orders Placed This Encounter  Procedures  . DG Chest 2 View    Standing Status:   Future    Number of Occurrences:   1    Standing Expiration Date:   02/28/2018    Order Specific Question:   Reason for Exam (SYMPTOM  OR DIAGNOSIS  REQUIRED)    Answer:   cough    Order Specific Question:   Preferred imaging location?    Answer:   Internal    Follow upHere as needed but as scheduled on September 11 with oncology.  Claretta Fraise, MD

## 2016-12-30 ENCOUNTER — Telehealth: Payer: Self-pay | Admitting: Family

## 2016-12-30 DIAGNOSIS — I10 Essential (primary) hypertension: Secondary | ICD-10-CM | POA: Diagnosis not present

## 2016-12-30 DIAGNOSIS — J440 Chronic obstructive pulmonary disease with acute lower respiratory infection: Secondary | ICD-10-CM | POA: Diagnosis not present

## 2016-12-30 DIAGNOSIS — J189 Pneumonia, unspecified organism: Secondary | ICD-10-CM | POA: Diagnosis not present

## 2016-12-30 DIAGNOSIS — I4891 Unspecified atrial fibrillation: Secondary | ICD-10-CM | POA: Diagnosis not present

## 2016-12-30 DIAGNOSIS — C3490 Malignant neoplasm of unspecified part of unspecified bronchus or lung: Secondary | ICD-10-CM | POA: Diagnosis not present

## 2016-12-30 DIAGNOSIS — R413 Other amnesia: Secondary | ICD-10-CM | POA: Diagnosis not present

## 2016-12-31 ENCOUNTER — Ambulatory Visit (INDEPENDENT_AMBULATORY_CARE_PROVIDER_SITE_OTHER): Payer: Medicare Other | Admitting: Pharmacist Clinician (PhC)/ Clinical Pharmacy Specialist

## 2016-12-31 DIAGNOSIS — I4891 Unspecified atrial fibrillation: Secondary | ICD-10-CM

## 2016-12-31 LAB — COAGUCHEK XS/INR WAIVED
INR: 3.1 — AB (ref 0.9–1.1)
Prothrombin Time: 37.8 s

## 2016-12-31 NOTE — Patient Instructions (Signed)
Anticoagulation Warfarin Dose Instructions as of 12/31/2016      Dorene Grebe Tue Wed Thu Fri Sat   New Dose 3 mg 1.5 mg 3 mg 1.5 mg 3 mg 1.5 mg 3 mg    Description   Take 1/2 tablet Mondays, Wednesdays, and Fridays and 1 tablet all other days of the week.  Patient has been taking 3mg  a day in rehab.  INR was 3.1  (goal is 2.0 to 3.0)

## 2017-01-04 DIAGNOSIS — I4891 Unspecified atrial fibrillation: Secondary | ICD-10-CM | POA: Diagnosis not present

## 2017-01-04 DIAGNOSIS — C3490 Malignant neoplasm of unspecified part of unspecified bronchus or lung: Secondary | ICD-10-CM | POA: Diagnosis not present

## 2017-01-04 DIAGNOSIS — J440 Chronic obstructive pulmonary disease with acute lower respiratory infection: Secondary | ICD-10-CM | POA: Diagnosis not present

## 2017-01-04 DIAGNOSIS — I10 Essential (primary) hypertension: Secondary | ICD-10-CM | POA: Diagnosis not present

## 2017-01-04 DIAGNOSIS — R413 Other amnesia: Secondary | ICD-10-CM | POA: Diagnosis not present

## 2017-01-04 DIAGNOSIS — J189 Pneumonia, unspecified organism: Secondary | ICD-10-CM | POA: Diagnosis not present

## 2017-01-05 DIAGNOSIS — C3490 Malignant neoplasm of unspecified part of unspecified bronchus or lung: Secondary | ICD-10-CM | POA: Diagnosis not present

## 2017-01-05 DIAGNOSIS — I10 Essential (primary) hypertension: Secondary | ICD-10-CM | POA: Diagnosis not present

## 2017-01-05 DIAGNOSIS — R413 Other amnesia: Secondary | ICD-10-CM | POA: Diagnosis not present

## 2017-01-05 DIAGNOSIS — J440 Chronic obstructive pulmonary disease with acute lower respiratory infection: Secondary | ICD-10-CM | POA: Diagnosis not present

## 2017-01-05 DIAGNOSIS — J189 Pneumonia, unspecified organism: Secondary | ICD-10-CM | POA: Diagnosis not present

## 2017-01-05 DIAGNOSIS — I4891 Unspecified atrial fibrillation: Secondary | ICD-10-CM | POA: Diagnosis not present

## 2017-01-06 ENCOUNTER — Encounter: Payer: Self-pay | Admitting: Family

## 2017-01-06 ENCOUNTER — Ambulatory Visit (INDEPENDENT_AMBULATORY_CARE_PROVIDER_SITE_OTHER): Payer: Medicare Other | Admitting: Family

## 2017-01-06 VITALS — BP 122/77 | HR 87 | Temp 99.4°F | Ht 62.0 in | Wt 129.2 lb

## 2017-01-06 DIAGNOSIS — C3491 Malignant neoplasm of unspecified part of right bronchus or lung: Secondary | ICD-10-CM | POA: Diagnosis not present

## 2017-01-06 DIAGNOSIS — R55 Syncope and collapse: Secondary | ICD-10-CM

## 2017-01-06 DIAGNOSIS — Z7901 Long term (current) use of anticoagulants: Secondary | ICD-10-CM

## 2017-01-06 DIAGNOSIS — I4891 Unspecified atrial fibrillation: Secondary | ICD-10-CM | POA: Diagnosis not present

## 2017-01-06 DIAGNOSIS — Z9181 History of falling: Secondary | ICD-10-CM | POA: Diagnosis not present

## 2017-01-06 LAB — COAGUCHEK XS/INR WAIVED
INR: 3.5 — AB (ref 0.9–1.1)
PROTHROMBIN TIME: 42.4 s

## 2017-01-06 NOTE — Patient Instructions (Addendum)
Anticoagulation Warfarin Dose Instructions as of 01/06/2017      Alexandria Chen Tue Wed Thu Fri Sat   New Dose 3 mg 1.5 mg 1.5 mg 1.5 mg 3 mg 1.5 mg 1.5 mg    Description   Hold today's dose. Take 1 tablet (3 mg) on Sunday and Thursday and 1/2 tablet (1.5 mg) on all the other days. Daughter said she had the 3mg  filled from rehab.  INR was 3.5  (goal is 2.0 to 3.0)       Fall Prevention in the Home Falls can cause injuries and can affect people from all age groups. There are many simple things that you can do to make your home safe and to help prevent falls. What can I do on the outside of my home?  Regularly repair the edges of walkways and driveways and fix any cracks.  Remove high doorway thresholds.  Trim any shrubbery on the main path into your home.  Use bright outdoor lighting.  Clear walkways of debris and clutter, including tools and rocks.  Regularly check that handrails are securely fastened and in good repair. Both sides of any steps should have handrails.  Install guardrails along the edges of any raised decks or porches.  Have leaves, snow, and ice cleared regularly.  Use sand or salt on walkways during winter months.  In the garage, clean up any spills right away, including grease or oil spills. What can I do in the bathroom?  Use night lights.  Install grab bars by the toilet and in the tub and shower. Do not use towel bars as grab bars.  Use non-skid mats or decals on the floor of the tub or shower.  If you need to sit down while you are in the shower, use a plastic, non-slip stool.  Keep the floor dry. Immediately clean up any water that spills on the floor.  Remove soap buildup in the tub or shower on a regular basis.  Attach bath mats securely with double-sided non-slip rug tape.  Remove throw rugs and other tripping hazards from the floor. What can I do in the bedroom?  Use night lights.  Make sure that a bedside light is easy to reach.  Do not use  oversized bedding that drapes onto the floor.  Have a firm chair that has side arms to use for getting dressed.  Remove throw rugs and other tripping hazards from the floor. What can I do in the kitchen?  Clean up any spills right away.  Avoid walking on wet floors.  Place frequently used items in easy-to-reach places.  If you need to reach for something above you, use a sturdy step stool that has a grab bar.  Keep electrical cables out of the way.  Do not use floor polish or wax that makes floors slippery. If you have to use wax, make sure that it is non-skid floor wax.  Remove throw rugs and other tripping hazards from the floor. What can I do in the stairways?  Do not leave any items on the stairs.  Make sure that there are handrails on both sides of the stairs. Fix handrails that are broken or loose. Make sure that handrails are as long as the stairways.  Check any carpeting to make sure that it is firmly attached to the stairs. Fix any carpet that is loose or worn.  Avoid having throw rugs at the top or bottom of stairways, or secure the rugs with carpet tape to  prevent them from moving.  Make sure that you have a light switch at the top of the stairs and the bottom of the stairs. If you do not have them, have them installed. What are some other fall prevention tips?  Wear closed-toe shoes that fit well and support your feet. Wear shoes that have rubber soles or low heels.  When you use a stepladder, make sure that it is completely opened and that the sides are firmly locked. Have someone hold the ladder while you are using it. Do not climb a closed stepladder.  Add color or contrast paint or tape to grab bars and handrails in your home. Place contrasting color strips on the first and last steps.  Use mobility aids as needed, such as canes, walkers, scooters, and crutches.  Turn on lights if it is dark. Replace any light bulbs that burn out.  Set up furniture so that  there are clear paths. Keep the furniture in the same spot.  Fix any uneven floor surfaces.  Choose a carpet design that does not hide the edge of steps of a stairway.  Be aware of any and all pets.  Review your medicines with your healthcare provider. Some medicines can cause dizziness or changes in blood pressure, which increase your risk of falling. Talk with your health care provider about other ways that you can decrease your risk of falls. This may include working with a physical therapist or trainer to improve your strength, balance, and endurance. This information is not intended to replace advice given to you by your health care provider. Make sure you discuss any questions you have with your health care provider. Document Released: 04/09/2002 Document Revised: 09/16/2015 Document Reviewed: 05/24/2014 Elsevier Interactive Patient Education  2017 [employer].

## 2017-01-06 NOTE — Progress Notes (Signed)
   Subjective:    Patient ID: Alexandria Chen, female    DOB: [birth year], 82 y.o.   MRN: [id number]  HPI Pt presents to the office today for hospital follow up. Pt went to the ED on 11/27/16 for syncope episodes. Pt was discharged on 12/06/16 to SNF. Pt was discharged from SNF on 12/25/16 and currently living with daughter.   PT has lung cancer and post radiation and chemotherapy July. Pt had xray that showed small developing pneumonia in LUL. Pt was started on zosyn and added IV Levaquin. Pt had repeat chest x-ray on 12/29/16.  PT on Levaquin 250 mg.   A Fib- PT taking warfarin. See anticoagulation.   Pt has follow up appt with Oncologist in a few weeks. Pt is working with Physical Therapy two times a week.   [facility] notes reviewed.   Review of Systems  Musculoskeletal: Positive for back pain.  Neurological: Positive for dizziness, weakness and light-headedness.  All other systems reviewed and are negative.      Objective:   Physical Exam  Constitutional: She is oriented to person, place, and time. She appears well-developed and well-nourished. No distress.  HENT:  Head: Normocephalic and atraumatic.  Right Ear: External ear normal.  Left Ear: External ear normal.  Nose: Nose normal.  Mouth/Throat: Oropharynx is clear and moist.  Eyes: Pupils are equal, round, and reactive to light.  Neck: Normal range of motion. Neck supple. No thyromegaly present.  Cardiovascular: Normal rate, normal heart sounds and intact distal pulses.  An irregular rhythm present.  No murmur heard. Pulmonary/Chest: Effort normal. No respiratory distress. She has decreased breath sounds. She has no wheezes.  Abdominal: Soft. Bowel sounds are normal. She exhibits no distension. There is no tenderness.  Musculoskeletal: Normal range of motion. She exhibits no edema or tenderness.  Generalized weakness, gait unsteady  Neurological: She is alert and oriented to person, place, and time.  Skin: Skin is warm and  dry.  Psychiatric: She has a normal mood and affect. Her behavior is normal. Judgment and thought content normal.  Vitals reviewed.   BP 122/77   Pulse 87   Temp 99.4 F (37.4 C) (Oral)   Ht '5\' 2"'$  (1.575 m)   Wt 129 lb 3.2 oz (58.6 kg)   BMI 23.63 kg/m      Assessment & Plan:  1. Atrial fibrillation, unspecified type [facility]) Anticoagulation Warfarin Dose Instructions as of 01/06/2017      Dorene Grebe Tue Wed Thu Fri Sat   New Dose 3 mg 1.5 mg 1.5 mg 1.5 mg 3 mg 1.5 mg 1.5 mg    Description   Hold today's dose. Take 1 tablet (3 mg) on Sunday and Thursday and 1/2 tablet (1.5 mg) on all the other days. Daughter said she had the '3mg'$  filled from rehab.  INR was 3.5  (goal is 2.0 to 3.0)     - CoaguChek XS/INR Waived - CMP14+EGFR - CBC with Differential/Platelet  2. Syncope and collapse - CMP14+EGFR - CBC with Differential/Platelet  3. Squamous cell carcinoma of right lung (HCC) - CMP14+EGFR - CBC with Differential/Platelet  4. Warfarin anticoagulation - CMP14+EGFR - CBC with Differential/Platelet  5. At high risk for falls - CMP14+EGFR - CBC with Differential/Platelet  Labs pending  Long discussion about falls risks- Continue to work with Physical Therapy Keep all Oncologists apps RTO in 2 weeks     Evelina Dun, FNP

## 2017-01-07 LAB — CMP14+EGFR
A/G RATIO: 1.2 (ref 1.2–2.2)
ALBUMIN: 3.8 g/dL (ref 3.5–4.7)
ALT: 9 IU/L (ref 0–32)
AST: 17 IU/L (ref 0–40)
Alkaline Phosphatase: 81 IU/L (ref 39–117)
BILIRUBIN TOTAL: 0.5 mg/dL (ref 0.0–1.2)
BUN/Creatinine Ratio: 19 (ref 12–28)
BUN: 16 mg/dL (ref 8–27)
CO2: 24 mmol/L (ref 20–29)
CREATININE: 0.84 mg/dL (ref 0.57–1.00)
Calcium: 9.6 mg/dL (ref 8.7–10.3)
Chloride: 99 mmol/L (ref 96–106)
GFR calc Af Amer: 76 mL/min/{1.73_m2} (ref >59)
GFR calc non Af Amer: 66 mL/min/{1.73_m2} (ref >59)
Globulin, Total: 3.2 g/dL (ref 1.5–4.5)
Glucose: 77 mg/dL (ref 65–99)
Potassium: 4.2 mmol/L (ref 3.5–5.2)
SODIUM: 139 mmol/L (ref 134–144)
TOTAL PROTEIN: 7 g/dL (ref 6.0–8.5)

## 2017-01-07 LAB — CBC WITH DIFFERENTIAL/PLATELET
Basophils Absolute: 0 10*3/uL (ref 0.0–0.2)
Basos: 0 %
EOS (ABSOLUTE): 0.1 10*3/uL (ref 0.0–0.4)
EOS: 1 %
HEMATOCRIT: 33.3 % — AB (ref 34.0–46.6)
HEMOGLOBIN: 10.6 g/dL — AB (ref 11.1–15.9)
IMMATURE GRANS (ABS): 0 10*3/uL (ref 0.0–0.1)
IMMATURE GRANULOCYTES: 0 %
LYMPHS: 14 %
Lymphocytes Absolute: 1.1 10*3/uL (ref 0.7–3.1)
MCH: 29.3 pg (ref 26.6–33.0)
MCHC: 31.8 g/dL (ref 31.5–35.7)
MCV: 92 fL (ref 79–97)
MONOS ABS: 1.5 10*3/uL — AB (ref 0.1–0.9)
Monocytes: 20 %
NEUTROS PCT: 65 %
Neutrophils Absolute: 4.8 10*3/uL (ref 1.4–7.0)
Platelets: 353 10*3/uL (ref 150–379)
RBC: 3.62 x10E6/uL — AB (ref 3.77–5.28)
RDW: 17.4 % — ABNORMAL HIGH (ref 12.3–15.4)
WBC: 7.4 10*3/uL (ref 3.4–10.8)

## 2017-01-10 DIAGNOSIS — R413 Other amnesia: Secondary | ICD-10-CM | POA: Diagnosis not present

## 2017-01-10 DIAGNOSIS — J189 Pneumonia, unspecified organism: Secondary | ICD-10-CM | POA: Diagnosis not present

## 2017-01-10 DIAGNOSIS — J440 Chronic obstructive pulmonary disease with acute lower respiratory infection: Secondary | ICD-10-CM | POA: Diagnosis not present

## 2017-01-10 DIAGNOSIS — I4891 Unspecified atrial fibrillation: Secondary | ICD-10-CM | POA: Diagnosis not present

## 2017-01-10 DIAGNOSIS — C3490 Malignant neoplasm of unspecified part of unspecified bronchus or lung: Secondary | ICD-10-CM | POA: Diagnosis not present

## 2017-01-10 DIAGNOSIS — I10 Essential (primary) hypertension: Secondary | ICD-10-CM | POA: Diagnosis not present

## 2017-01-11 ENCOUNTER — Other Ambulatory Visit: Payer: Self-pay | Admitting: *Deleted

## 2017-01-11 ENCOUNTER — Encounter: Payer: Self-pay | Admitting: Cardiothoracic Surgery

## 2017-01-11 ENCOUNTER — Institutional Professional Consult (permissible substitution) (INDEPENDENT_AMBULATORY_CARE_PROVIDER_SITE_OTHER): Payer: Medicare Other | Admitting: Cardiothoracic Surgery

## 2017-01-11 VITALS — BP 105/86 | HR 76 | Resp 20 | Ht 61.0 in | Wt 128.0 lb

## 2017-01-11 DIAGNOSIS — R911 Solitary pulmonary nodule: Secondary | ICD-10-CM

## 2017-01-11 DIAGNOSIS — Z85118 Personal history of other malignant neoplasm of bronchus and lung: Secondary | ICD-10-CM | POA: Diagnosis not present

## 2017-01-11 DIAGNOSIS — R05 Cough: Secondary | ICD-10-CM

## 2017-01-11 DIAGNOSIS — R059 Cough, unspecified: Secondary | ICD-10-CM

## 2017-01-11 NOTE — Progress Notes (Signed)
[address]       [place] [address]             [facility] Record #[id number] Date of Birth: October 31, 1934  Referring: Twana First, MD Primary Care: [name], FNP  Chief Complaint:    Chief Complaint  Patient presents with  . 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   History of Present Illness:    Alexandria Chen 82 y.o. female is seen in the office  today for evaluation of recurrent right lower lobe lung mass versus infiltrate. The patient has not been seen in the surgical office previously, she's been undergoing treatment for advanced stage non-small cell carcinoma the lung, originally biopsied with a CT-guided needle biopsy of the right upper lobe lesion. She's been treated intermittently for pneumonia she completed chemotherapy and radiation in July 2018. More than a month ago a follow-up CT showed question of a new right lower lobe lung lesion, follow-up chest x-ray 2 weeks ago showed clearing of her left upper lobe lung pneumonia.      Current Activity/ Functional Status:  Patient is not independent with mobility/ambulation, transfers, ADL's, IADL's.   Zubrod Score: At the time of surgery this patient's most appropriate activity status/level should be described as: []     0    Normal activity, no symptoms []     1    Restricted in physical strenuous activity but ambulatory, able to do out light work [x]     2    Ambulatory and capable of self care, unable to do work activities, up and about               >50 % of waking hours                              []     3    Only limited self care, in bed greater than 50% of waking hours []     4    Completely disabled, no self care, confined to bed or chair []     5    Moribund   Past Medical History:  Diagnosis Date  . A-fib  ([place])   . Alcohol abuse, in remission   . Anxiety   . Atrial fibrillation ([place])   . Depression   . Dyspnea    walking distances   . Dysrhythmia   . History of bronchitis   . HTN (hypertension)   . Mass of right lung 07/13/2016  . Osteopenia   . Squamous cell carcinoma of right lung ([place]) 07/13/2016    Past Surgical History:  Procedure Laterality Date  . IR FLUORO GUIDE PORT INSERTION RIGHT  09/17/2016  . IR US GUIDE VASC ACCESS RIGHT  09/17/2016    Family History  Problem Relation Age of Onset  . Anxiety disorder Mother   . Dementia Mother   . Hypertension Mother   . Alcohol abuse Father   . Arthritis Father   . Alcohol abuse Brother   . ADD / ADHD Neg Hx   . Drug abuse Neg Hx   . Bipolar disorder Neg Hx   . Depression  Neg Hx   . OCD Neg Hx   . Paranoid behavior Neg Hx   . Schizophrenia Neg Hx   . Seizures Neg Hx   . Sexual abuse Neg Hx   . Physical abuse Neg Hx     Social History   Social History  . Marital status: Widowed    Spouse name: N/A  . Number of children: 5  . Years of education: N/A   Occupational History  . Retired    Social History Main Topics  . Smoking status: Former Smoker    Packs/day: 2.00    Years: 60.00    Types: Cigarettes    Start date: 05/03/1956    Quit date: 07/15/2016  . Smokeless tobacco: Never Used  . Alcohol use No     Comment: Patient reports no alcohol use since 05/24/2016.  . Drug use: No  . Sexual activity: No   Other Topics Concern  . Not on file   Social History Narrative  . No narrative on file    History  Smoking Status  . Former Smoker  . Packs/day: 2.00  . Years: 60.00  . Types: Cigarettes  . Start date: 05/03/1956  . Quit date: 07/15/2016  Smokeless Tobacco  . Never Used    History  Alcohol Use No    Comment: Patient reports no alcohol use since 05/24/2016.     No Known Allergies  Current Outpatient Prescriptions  Medication Sig Dispense Refill  . acetaminophen (TYLENOL) 325 MG tablet Take 2  tablets (650 mg total) by mouth every 4 (four) hours as needed for mild pain, moderate pain, fever or headache. 100 tablet 1  . amiodarone (PACERONE) 200 MG tablet Take 200 mg by mouth daily.     Marland Kitchen aspirin EC 81 MG tablet Take 81 mg by mouth daily.    Marland Kitchen atorvastatin (LIPITOR) 40 MG tablet TAKE 1 TABLET (40 MG TOTAL) BY MOUTH DAILY. FOR HYPERLIPIDEMIA 90 tablet 1  . busPIRone (BUSPAR) 15 MG tablet Take 1 tablet (15 mg total) by mouth 2 (two) times daily. 180 tablet 2  . DULoxetine (CYMBALTA) 60 MG capsule Take 1 capsule (60 mg total) by mouth daily. 30 capsule 2  . fish oil-omega-3 fatty acids 1000 MG capsule Take 3 capsules (3 g total) by mouth daily. For hyperlipidemia. (Patient taking differently: Take 1 g by mouth 3 (three) times daily. For hyperlipidemia.) 30 capsule   . fluticasone (FLONASE) 50 MCG/ACT nasal spray Place 2 sprays into both nostrils 2 (two) times daily. Use as needed (Patient taking differently: Place 2 sprays into both nostrils 2 (two) times daily as needed for allergies. Use as needed) 16 g 6  . gabapentin (NEURONTIN) 100 MG capsule Take 1 capsule (100 mg total) by mouth at bedtime. 90 capsule 2  . nitroGLYCERIN (NITROSTAT) 0.4 MG SL tablet Place 0.4 mg under the tongue.    Marland Kitchen omeprazole (PRILOSEC) 40 MG capsule Take 1 capsule (40 mg total) by mouth daily. 30 capsule 2  . warfarin (COUMADIN) 2.5 MG tablet Take 3 mg by mouth daily.      No current facility-administered medications for this visit.     Pertinent items are noted in HPI.   Review of Systems:     Cardiac Review of Systems: Y or N  Chest Pain [  n  ]  Resting SOB [ y  ] Exertional SOB  Blue.Reese ]  Vertell Limber Florencio.Farrier  ]   Pedal Edema [ n  ]    Palpitations [  n ] Syncope  [  ]   Presyncope [ n  ]  General Review of Systems: [Y] = yes [  ]=no Constitional: recent weight change [  ];  Wt loss over the last 3 months [   ] anorexia [  ]; fatigue [  ]; nausea [  ]; night sweats [  ]; fever [  ]; or chills [  ];          Dental:  poor dentition[  ]; Last Dentist visit:   Eye : blurred vision [  ]; diplopia [   ]; vision changes [  ];  Amaurosis fugax[  ]; Resp: cough Blue.Reese  ];  wheezing[ y ];  hemoptysis[n  ]; shortness of breath[y  ]; paroxysmal nocturnal dyspnea[ y ]; dyspnea on exertion[  ]; or orthopnea[  ];  GI:  gallstones[  ], vomiting[  ];  dysphagia[  ]; melena[  ];  hematochezia [  ]; heartburn[  ];   Hx of  Colonoscopy[  ]; GU: kidney stones [  ]; hematuria[  ];   dysuria [  ];  nocturia[  ];  history of     obstruction [  ]; urinary frequency [  ]             Skin: rash, swelling[  ];, hair loss[  ];  peripheral edema[  ];  or itching[  ]; Musculosketetal: myalgias[  ];  joint swelling[  ];  joint erythema[  ];  joint pain[  ];  back pain[  ];  Heme/Lymph: bruising[  ];  bleeding[  ];  anemia[  ];  Neuro: TIA[n ];  headaches[  ];  stroke[  ];  vertigo[  ];  seizures[ n ];   paresthesias[  ];  difficulty walking[y  ];  Psych:depression[  ]; anxiety[  ];  Endocrine: diabetes[n  ];  thyroid dysfunction[n  ];  Immunizations: Flu up to date [  ]; Pneumococcal up to date [  ];  Other:  Physical Exam: BP 105/86   Pulse 76   Resp 20   Ht 5\' 1"  (1.549 m)   Wt 128 lb (58.1 kg)   SpO2 94%   BMI 24.19 kg/m  Comes in the office in a wheelchair PHYSICAL EXAMINATION: General appearance: alert, cooperative, appears older than stated age, cachectic and no distress Head: Normocephalic, without obvious abnormality, atraumatic Neck: no adenopathy, no carotid bruit, no JVD, supple, symmetrical, trachea midline and thyroid not enlarged, symmetric, no tenderness/mass/nodules Lymph nodes: Cervical, supraclavicular, and axillary nodes normal. Resp: diminished breath sounds bibasilar Back: symmetric, no curvature. ROM normal. No CVA tenderness. Cardio: irregularly irregular rhythm GI: soft, non-tender; bowel sounds normal; no masses,  no organomegaly Extremities: extremities normal, atraumatic, no cyanosis or edema and  Homans sign is negative, no sign of DVT Neurologic: Grossly normal  Diagnostic Studies & Laboratory data:     Recent Radiology Findings:   Dg Chest 2 View  Result Date: 12/30/2016 CLINICAL DATA:  Pneumonia, follow-up EXAM: CHEST  2 VIEW COMPARISON:  CT 12/15/2015.  Chest x-ray 12/02/2016. FINDINGS: Right Port-A-Cath remains in place, unchanged. Right upper lobe mass again noted. Previously seen left upper lobe pneumonia has cleared. No effusions or acute airspace opacities. Heart is normal size. IMPRESSION: Interval clearance of the left upper lobe pneumonia. No new confluent airspace opacities. Right upper lobe mass again noted. Electronically Signed   By: Rolm Baptise M.D.   On: 12/30/2016 07:40   Ct Chest W Contrast Ct Abdomen  Pelvis W Contrast  Result Date: 12/14/2016 CLINICAL DATA:  82 year old female with history of non-small cell lung cancer of the right lung. Followup study. EXAM: CT CHEST, ABDOMEN, AND PELVIS WITH CONTRAST TECHNIQUE: Multidetector CT imaging of the chest, abdomen and pelvis was performed following the standard protocol during bolus administration of intravenous contrast. CONTRAST:  168mL ISOVUE-300 IOPAMIDOL (ISOVUE-300) INJECTION 61% COMPARISON:  Chest CT 11/27/2016.  PET-CT 07/07/2016. FINDINGS: CT CHEST FINDINGS Cardiovascular: Heart size is mildly enlarged. There is no significant pericardial fluid, thickening or pericardial calcification. There is aortic atherosclerosis, as well as atherosclerosis of the great vessels of the mediastinum and the coronary arteries, including calcified atherosclerotic plaque in the left main, left anterior descending and left circumflex coronary arteries. Right internal jugular single-lumen porta cath with tip terminating in the right atrium. Mediastinum/Nodes: No pathologically enlarged mediastinal or hilar lymph nodes. Esophagus is unremarkable in appearance. No axillary lymphadenopathy. Lungs/Pleura: Previously noted right upper lobe mass  has decreased in size, currently a 3.1 x 2.0 cm mass in the medial aspect of the right upper lobe abutting the pleura (axial image 18 of series 2). Previously noted area of mass-like airspace consolidation in the medial aspect of the right lower lobe is slightly smaller than the prior examination currently measuring 4.3 x 2.6 cm with a well-defined internal air bronchogram (axial image 71 of series 6). Previously noted area of airspace consolidation in the left upper lobe has become far less confluent, now with extensive areas of residual ground-glass attenuation and septal thickening in its wake. Additional areas of ground-glass attenuation septal thickening are also noted in the posterior aspect of the right upper lobe and in the superior segment of the right lower lobe, favored to be related to prior radiation therapy, although this could alternatively be infectious or inflammatory in etiology. Mild diffuse bronchial wall thickening with mild centrilobular and paraseptal emphysema. Musculoskeletal: There are no aggressive appearing lytic or blastic lesions noted in the visualized portions of the skeleton. CT ABDOMEN PELVIS FINDINGS Hepatobiliary: 3.7 x 2.1 cm lesion in the periphery of the right lobe of the liver involving portions of segments 5 and 6, similar to prior studies, compatible with a cavernous hemangioma. No new suspicious appearing hepatic lesions are noted. No intra or extrahepatic biliary ductal dilatation. Gallbladder is normal in appearance. Pancreas: No pancreatic mass. No pancreatic ductal dilatation. No pancreatic or peripancreatic fluid or inflammatory changes. Spleen: Unremarkable. Adrenals/Urinary Tract: Subcentimeter low-attenuation lesion in the lower pole the right kidney is too small to definitively characterize, but is statistically likely a cyst. 4.2 cm low-intermediate attenuation (27 HU) lesion in the lower pole of the left kidney is similar to prior studies, presumably a  proteinaceous cyst. Bilateral adrenal glands are normal in appearance. No hydroureteronephrosis. Urinary bladder is normal in appearance. Stomach/Bowel: The appearance of the stomach is normal. There is no pathologic dilatation of small bowel or colon. The appendix is not confidently identified and may be surgically absent. Regardless, there are no inflammatory changes noted adjacent to the cecum to suggest the presence of an acute appendicitis at this time. Vascular/Lymphatic: Aortic atherosclerosis, without evidence of aneurysm or dissection in the abdominal or pelvic vasculature. No lymphadenopathy noted in the abdomen or pelvis. Reproductive: Innumerable coarse calcifications throughout the uterus, presumably related to multifocal fibroids. Ovaries are atrophic. Other: No significant volume of ascites.  No pneumoperitoneum. Musculoskeletal: There are no aggressive appearing lytic or blastic lesions noted in the visualized portions of the skeleton. IMPRESSION: 1. Previously noted right upper  lobe mass is smaller than prior PET-CT, but similar in size to recent chest CT 11/27/2016. Previously noted mass like area of airspace consolidation in the right lower lobe is slightly smaller than the recent prior examination, but remains of uncertain etiology and significance. Other patchy areas of ground-glass attenuation and septal thickening in the upper lungs bilaterally are slightly improved on the left and increased on the right, and favored to reflect evolving postradiation changes, but could alternatively be infectious or inflammatory in etiology. 2. No evidence of new metastatic disease to the abdomen or pelvis. 3. Cardiomegaly. 4. Mild diffuse bronchial wall thickening with mild centrilobular and paraseptal emphysema; imaging findings suggestive of underlying COPD. 5. Aortic atherosclerosis, in addition to left main and 3 vessel coronary artery disease. 6. Additional incidental findings, as above. Aortic  Atherosclerosis (ICD10-I70.0) and Emphysema (ICD10-J43.9). Electronically Signed   By: Vinnie Langton M.D.   On: 12/14/2016 14:57      I have independently reviewed the above radiologic studies.  Recent Lab Findings: Lab Results  Component Value Date   WBC 7.4 01/06/2017   HGB 10.6 (L) 01/06/2017   HCT 33.3 (L) 01/06/2017   PLT 353 01/06/2017   GLUCOSE 77 01/06/2017   CHOL 161 05/21/2016   TRIG 63 05/21/2016   HDL 48 05/21/2016   LDLCALC 100 (H) 05/21/2016   ALT 9 01/06/2017   AST 17 01/06/2017   NA 139 01/06/2017   K 4.2 01/06/2017   CL 99 01/06/2017   CREATININE 0.84 01/06/2017   BUN 16 01/06/2017   CO2 24 01/06/2017   TSH 0.327 (L) 11/27/2016   INR 3.5 (H) 01/06/2017      Assessment / Plan:      With the current CAT scan being over 4-5 weeks ago and subsequent chest x-rays have showed waxing and waning pulmonary infiltrates before deciding on a biopsy a repeat CT scan of the chest will be done. I discussed with the patient and her daughters if the lesion in the right lower lobe is still present will refer the patient for CT-guided needle biopsy of the right lower lobe lesion. She is on chronic Coumadin for atrial fibrillation this will have to be managed prior to biopsy.   I  spent 30 minutes counseling the patient face to face and 50% or more the  time was spent in counseling and coordination of care. The total time spent in the appointment was 55 minutes.  Grace Isaac MD      [address] [place] [address] Office [phone]   Beeper [phone]  01/11/2017 5:03 PM

## 2017-01-12 ENCOUNTER — Ambulatory Visit
Admission: RE | Admit: 2017-01-12 | Discharge: 2017-01-12 | Disposition: A | Discharge status: Home / Self Care | Payer: Medicare Other | Source: Ambulatory Visit | Attending: Cardiothoracic Surgery | Admitting: Cardiothoracic Surgery

## 2017-01-12 ENCOUNTER — Encounter: Payer: Self-pay | Admitting: *Deleted

## 2017-01-12 DIAGNOSIS — R059 Cough, unspecified: Secondary | ICD-10-CM

## 2017-01-12 DIAGNOSIS — R05 Cough: Secondary | ICD-10-CM

## 2017-01-12 DIAGNOSIS — C3491 Malignant neoplasm of unspecified part of right bronchus or lung: Secondary | ICD-10-CM | POA: Diagnosis not present

## 2017-01-12 DIAGNOSIS — R911 Solitary pulmonary nodule: Secondary | ICD-10-CM

## 2017-01-13 ENCOUNTER — Telehealth: Payer: Self-pay | Admitting: Cardiothoracic Surgery

## 2017-01-13 NOTE — Telephone Encounter (Signed)
Patient notified   of CT findings , no need for biopsy at this time, continued follow up with medical oncology.

## 2017-01-14 ENCOUNTER — Encounter (HOSPITAL_COMMUNITY): Payer: Self-pay

## 2017-01-14 ENCOUNTER — Inpatient Hospital Stay (HOSPITAL_COMMUNITY)
Admission: EM | Admit: 2017-01-14 | Discharge: 2017-03-03 | DRG: 205 | Disposition: E | Payer: Medicare Other | Attending: Internal Medicine | Admitting: Internal Medicine

## 2017-01-14 ENCOUNTER — Emergency Department (HOSPITAL_COMMUNITY): Payer: Medicare Other

## 2017-01-14 ENCOUNTER — Telehealth: Payer: Self-pay | Admitting: Family

## 2017-01-14 ENCOUNTER — Encounter: Payer: Medicare Other | Admitting: Pharmacist Clinician (PhC)/ Clinical Pharmacy Specialist

## 2017-01-14 ENCOUNTER — Other Ambulatory Visit: Payer: Self-pay

## 2017-01-14 ENCOUNTER — Ambulatory Visit: Payer: Self-pay | Admitting: Pharmacist Clinician (PhC)/ Clinical Pharmacy Specialist

## 2017-01-14 DIAGNOSIS — Z7982 Long term (current) use of aspirin: Secondary | ICD-10-CM

## 2017-01-14 DIAGNOSIS — D72829 Elevated white blood cell count, unspecified: Secondary | ICD-10-CM | POA: Diagnosis not present

## 2017-01-14 DIAGNOSIS — J700 Acute pulmonary manifestations due to radiation: Secondary | ICD-10-CM | POA: Diagnosis not present

## 2017-01-14 DIAGNOSIS — T462X5A Adverse effect of other antidysrhythmic drugs, initial encounter: Secondary | ICD-10-CM | POA: Diagnosis not present

## 2017-01-14 DIAGNOSIS — R739 Hyperglycemia, unspecified: Secondary | ICD-10-CM | POA: Diagnosis not present

## 2017-01-14 DIAGNOSIS — Z515 Encounter for palliative care: Secondary | ICD-10-CM | POA: Diagnosis not present

## 2017-01-14 DIAGNOSIS — Z8261 Family history of arthritis: Secondary | ICD-10-CM

## 2017-01-14 DIAGNOSIS — G894 Chronic pain syndrome: Secondary | ICD-10-CM | POA: Diagnosis present

## 2017-01-14 DIAGNOSIS — J189 Pneumonia, unspecified organism: Secondary | ICD-10-CM

## 2017-01-14 DIAGNOSIS — I482 Chronic atrial fibrillation: Secondary | ICD-10-CM | POA: Diagnosis present

## 2017-01-14 DIAGNOSIS — Z811 Family history of alcohol abuse and dependence: Secondary | ICD-10-CM

## 2017-01-14 DIAGNOSIS — D649 Anemia, unspecified: Secondary | ICD-10-CM

## 2017-01-14 DIAGNOSIS — D638 Anemia in other chronic diseases classified elsewhere: Secondary | ICD-10-CM | POA: Diagnosis present

## 2017-01-14 DIAGNOSIS — J9601 Acute respiratory failure with hypoxia: Secondary | ICD-10-CM | POA: Diagnosis present

## 2017-01-14 DIAGNOSIS — I4891 Unspecified atrial fibrillation: Secondary | ICD-10-CM

## 2017-01-14 DIAGNOSIS — E871 Hypo-osmolality and hyponatremia: Secondary | ICD-10-CM | POA: Diagnosis not present

## 2017-01-14 DIAGNOSIS — I10 Essential (primary) hypertension: Secondary | ICD-10-CM | POA: Diagnosis present

## 2017-01-14 DIAGNOSIS — R0902 Hypoxemia: Secondary | ICD-10-CM

## 2017-01-14 DIAGNOSIS — F418 Other specified anxiety disorders: Secondary | ICD-10-CM | POA: Diagnosis present

## 2017-01-14 DIAGNOSIS — K219 Gastro-esophageal reflux disease without esophagitis: Secondary | ICD-10-CM | POA: Diagnosis present

## 2017-01-14 DIAGNOSIS — Z923 Personal history of irradiation: Secondary | ICD-10-CM

## 2017-01-14 DIAGNOSIS — J9801 Acute bronchospasm: Secondary | ICD-10-CM

## 2017-01-14 DIAGNOSIS — J441 Chronic obstructive pulmonary disease with (acute) exacerbation: Secondary | ICD-10-CM | POA: Diagnosis present

## 2017-01-14 DIAGNOSIS — I2510 Atherosclerotic heart disease of native coronary artery without angina pectoris: Secondary | ICD-10-CM | POA: Diagnosis present

## 2017-01-14 DIAGNOSIS — R0602 Shortness of breath: Secondary | ICD-10-CM | POA: Diagnosis not present

## 2017-01-14 DIAGNOSIS — Z87891 Personal history of nicotine dependence: Secondary | ICD-10-CM

## 2017-01-14 DIAGNOSIS — F32A Depression, unspecified: Secondary | ICD-10-CM | POA: Diagnosis present

## 2017-01-14 DIAGNOSIS — J9600 Acute respiratory failure, unspecified whether with hypoxia or hypercapnia: Secondary | ICD-10-CM

## 2017-01-14 DIAGNOSIS — R140 Abdominal distension (gaseous): Secondary | ICD-10-CM

## 2017-01-14 DIAGNOSIS — F329 Major depressive disorder, single episode, unspecified: Secondary | ICD-10-CM | POA: Diagnosis not present

## 2017-01-14 DIAGNOSIS — Z7901 Long term (current) use of anticoagulants: Secondary | ICD-10-CM

## 2017-01-14 DIAGNOSIS — Z66 Do not resuscitate: Secondary | ICD-10-CM | POA: Diagnosis not present

## 2017-01-14 DIAGNOSIS — R001 Bradycardia, unspecified: Secondary | ICD-10-CM | POA: Diagnosis not present

## 2017-01-14 DIAGNOSIS — M8580 Other specified disorders of bone density and structure, unspecified site: Secondary | ICD-10-CM | POA: Diagnosis present

## 2017-01-14 DIAGNOSIS — Z9221 Personal history of antineoplastic chemotherapy: Secondary | ICD-10-CM

## 2017-01-14 DIAGNOSIS — Z7189 Other specified counseling: Secondary | ICD-10-CM

## 2017-01-14 DIAGNOSIS — J984 Other disorders of lung: Secondary | ICD-10-CM

## 2017-01-14 DIAGNOSIS — R062 Wheezing: Secondary | ICD-10-CM | POA: Diagnosis not present

## 2017-01-14 DIAGNOSIS — Z7951 Long term (current) use of inhaled steroids: Secondary | ICD-10-CM

## 2017-01-14 DIAGNOSIS — I951 Orthostatic hypotension: Secondary | ICD-10-CM | POA: Diagnosis not present

## 2017-01-14 DIAGNOSIS — R05 Cough: Secondary | ICD-10-CM | POA: Diagnosis not present

## 2017-01-14 DIAGNOSIS — I5033 Acute on chronic diastolic (congestive) heart failure: Secondary | ICD-10-CM | POA: Diagnosis present

## 2017-01-14 DIAGNOSIS — I480 Paroxysmal atrial fibrillation: Secondary | ICD-10-CM | POA: Diagnosis present

## 2017-01-14 DIAGNOSIS — D259 Leiomyoma of uterus, unspecified: Secondary | ICD-10-CM | POA: Diagnosis present

## 2017-01-14 DIAGNOSIS — E876 Hypokalemia: Secondary | ICD-10-CM | POA: Diagnosis not present

## 2017-01-14 DIAGNOSIS — E0781 Sick-euthyroid syndrome: Secondary | ICD-10-CM | POA: Diagnosis not present

## 2017-01-14 DIAGNOSIS — E785 Hyperlipidemia, unspecified: Secondary | ICD-10-CM | POA: Diagnosis present

## 2017-01-14 DIAGNOSIS — I110 Hypertensive heart disease with heart failure: Secondary | ICD-10-CM | POA: Diagnosis present

## 2017-01-14 DIAGNOSIS — Z85118 Personal history of other malignant neoplasm of bronchus and lung: Secondary | ICD-10-CM

## 2017-01-14 DIAGNOSIS — Z818 Family history of other mental and behavioral disorders: Secondary | ICD-10-CM

## 2017-01-14 DIAGNOSIS — E875 Hyperkalemia: Secondary | ICD-10-CM | POA: Diagnosis not present

## 2017-01-14 DIAGNOSIS — T380X5A Adverse effect of glucocorticoids and synthetic analogues, initial encounter: Secondary | ICD-10-CM | POA: Diagnosis not present

## 2017-01-14 DIAGNOSIS — I481 Persistent atrial fibrillation: Secondary | ICD-10-CM

## 2017-01-14 DIAGNOSIS — Z8249 Family history of ischemic heart disease and other diseases of the circulatory system: Secondary | ICD-10-CM

## 2017-01-14 LAB — BASIC METABOLIC PANEL
Anion gap: 9 (ref 5–15)
BUN: 14 mg/dL (ref 6–20)
CALCIUM: 8.8 mg/dL — AB (ref 8.9–10.3)
CO2: 25 mmol/L (ref 22–32)
CREATININE: 0.94 mg/dL (ref 0.44–1.00)
Chloride: 101 mmol/L (ref 101–111)
GFR calc non Af Amer: 56 mL/min — ABNORMAL LOW (ref >60)
Glucose, Bld: 117 mg/dL — ABNORMAL HIGH (ref 65–99)
Potassium: 3.3 mmol/L — ABNORMAL LOW (ref 3.5–5.1)
SODIUM: 135 mmol/L (ref 135–145)

## 2017-01-14 LAB — PROTIME-INR
INR: 2.76
Prothrombin Time: 29 seconds — ABNORMAL HIGH (ref 11.4–15.2)

## 2017-01-14 LAB — CBC WITH DIFFERENTIAL/PLATELET
BASOS PCT: 0 %
Basophils Absolute: 0 10*3/uL (ref 0.0–0.1)
EOS ABS: 0.1 10*3/uL (ref 0.0–0.7)
Eosinophils Relative: 1 %
HEMATOCRIT: 28.4 % — AB (ref 36.0–46.0)
Hemoglobin: 9.2 g/dL — ABNORMAL LOW (ref 12.0–15.0)
LYMPHS ABS: 1.1 10*3/uL (ref 0.7–4.0)
Lymphocytes Relative: 12 %
MCH: 29.1 pg (ref 26.0–34.0)
MCHC: 32.4 g/dL (ref 30.0–36.0)
MCV: 89.9 fL (ref 78.0–100.0)
MONO ABS: 1.2 10*3/uL — AB (ref 0.1–1.0)
MONOS PCT: 13 %
NEUTROS PCT: 74 %
Neutro Abs: 7.2 10*3/uL (ref 1.7–7.7)
Platelets: 417 10*3/uL — ABNORMAL HIGH (ref 150–400)
RBC: 3.16 MIL/uL — ABNORMAL LOW (ref 3.87–5.11)
RDW: 16.1 % — AB (ref 11.5–15.5)
WBC: 9.5 10*3/uL (ref 4.0–10.5)

## 2017-01-14 MED ORDER — PANTOPRAZOLE SODIUM 40 MG PO TBEC
40.0000 mg | DELAYED_RELEASE_TABLET | Freq: Every day | ORAL | Status: DC
  Administered 2017-01-14 – 2017-01-23 (×10): 40 mg via ORAL
  Filled 2017-01-14 (×10): qty 1

## 2017-01-14 MED ORDER — BUSPIRONE HCL 15 MG PO TABS
15.0000 mg | ORAL_TABLET | Freq: Two times a day (BID) | ORAL | Status: DC
  Administered 2017-01-14 – 2017-01-29 (×32): 15 mg via ORAL
  Filled 2017-01-14 (×21): qty 1
  Filled 2017-01-14: qty 3
  Filled 2017-01-14 (×14): qty 1

## 2017-01-14 MED ORDER — DULOXETINE HCL 60 MG PO CPEP
60.0000 mg | ORAL_CAPSULE | Freq: Every day | ORAL | Status: DC
  Administered 2017-01-14 – 2017-01-29 (×16): 60 mg via ORAL
  Filled 2017-01-14: qty 1
  Filled 2017-01-14: qty 2
  Filled 2017-01-14: qty 1
  Filled 2017-01-14 (×2): qty 2
  Filled 2017-01-14: qty 1
  Filled 2017-01-14 (×2): qty 2
  Filled 2017-01-14 (×2): qty 1
  Filled 2017-01-14 (×2): qty 2
  Filled 2017-01-14: qty 1
  Filled 2017-01-14: qty 2
  Filled 2017-01-14: qty 1
  Filled 2017-01-14 (×2): qty 2

## 2017-01-14 MED ORDER — DEXTROSE 5 % IV SOLN: 1.0000 g | Freq: Once | INTRAVENOUS | Status: DC

## 2017-01-14 MED ORDER — METHYLPREDNISOLONE SODIUM SUCC 125 MG IJ SOLR
125.0000 mg | Freq: Once | INTRAMUSCULAR | Status: AC
  Administered 2017-01-14: 125 mg via INTRAVENOUS
  Filled 2017-01-14: qty 2

## 2017-01-14 MED ORDER — PREDNISONE 20 MG PO TABS
40.0000 mg | ORAL_TABLET | Freq: Every day | ORAL | Status: DC
  Administered 2017-01-14 – 2017-01-18 (×5): 40 mg via ORAL
  Filled 2017-01-14 (×6): qty 2

## 2017-01-14 MED ORDER — PREDNISOLONE 5 MG PO TABS
40.0000 mg | ORAL_TABLET | Freq: Every day | ORAL | Status: DC
  Filled 2017-01-14 (×4): qty 8

## 2017-01-14 MED ORDER — DEXTROSE 5 % IV SOLN
2.0000 g | Freq: Once | INTRAVENOUS | Status: AC
  Administered 2017-01-14: 2 g via INTRAVENOUS
  Filled 2017-01-14: qty 2

## 2017-01-14 MED ORDER — ATORVASTATIN CALCIUM 40 MG PO TABS
40.0000 mg | ORAL_TABLET | Freq: Every day | ORAL | Status: DC
  Administered 2017-01-14 – 2017-01-25 (×12): 40 mg via ORAL
  Filled 2017-01-14 (×13): qty 1

## 2017-01-14 MED ORDER — SODIUM CHLORIDE 0.9 % IV SOLN
INTRAVENOUS | Status: AC
  Administered 2017-01-14 (×2): via INTRAVENOUS

## 2017-01-14 MED ORDER — CEFEPIME HCL 1 G IJ SOLR
1.0000 g | INTRAMUSCULAR | Status: DC
  Administered 2017-01-15 – 2017-01-22 (×8): 1 g via INTRAVENOUS
  Filled 2017-01-14 (×10): qty 1

## 2017-01-14 MED ORDER — IPRATROPIUM-ALBUTEROL 0.5-2.5 (3) MG/3ML IN SOLN
RESPIRATORY_TRACT | Status: AC
  Filled 2017-01-14: qty 3

## 2017-01-14 MED ORDER — IPRATROPIUM-ALBUTEROL 0.5-2.5 (3) MG/3ML IN SOLN
3.0000 mL | Freq: Once | RESPIRATORY_TRACT | Status: AC
  Administered 2017-01-14: 3 mL via RESPIRATORY_TRACT

## 2017-01-14 MED ORDER — POTASSIUM CHLORIDE CRYS ER 20 MEQ PO TBCR
40.0000 meq | EXTENDED_RELEASE_TABLET | Freq: Once | ORAL | Status: AC
  Administered 2017-01-14: 40 meq via ORAL
  Filled 2017-01-14: qty 2

## 2017-01-14 MED ORDER — NITROGLYCERIN 0.4 MG SL SUBL: 0.4000 mg | SUBLINGUAL_TABLET | SUBLINGUAL | Status: DC | PRN

## 2017-01-14 MED ORDER — GUAIFENESIN ER 600 MG PO TB12
600.0000 mg | ORAL_TABLET | Freq: Two times a day (BID) | ORAL | Status: DC
  Administered 2017-01-14 – 2017-01-29 (×32): 600 mg via ORAL
  Filled 2017-01-14 (×32): qty 1

## 2017-01-14 MED ORDER — POTASSIUM CHLORIDE CRYS ER 20 MEQ PO TBCR
20.0000 meq | EXTENDED_RELEASE_TABLET | Freq: Two times a day (BID) | ORAL | Status: DC
  Administered 2017-01-14 – 2017-01-20 (×13): 20 meq via ORAL
  Filled 2017-01-14 (×13): qty 1

## 2017-01-14 MED ORDER — IPRATROPIUM-ALBUTEROL 0.5-2.5 (3) MG/3ML IN SOLN
3.0000 mL | Freq: Four times a day (QID) | RESPIRATORY_TRACT | Status: DC
  Administered 2017-01-14 – 2017-01-16 (×8): 3 mL via RESPIRATORY_TRACT
  Filled 2017-01-14 (×8): qty 3

## 2017-01-14 MED ORDER — WARFARIN SODIUM 1 MG PO TABS
1.5000 mg | ORAL_TABLET | Freq: Once | ORAL | Status: AC
  Administered 2017-01-14: 1.5 mg via ORAL
  Filled 2017-01-14: qty 2

## 2017-01-14 MED ORDER — GABAPENTIN 100 MG PO CAPS
100.0000 mg | ORAL_CAPSULE | Freq: Every day | ORAL | Status: DC
  Administered 2017-01-14 – 2017-01-29 (×16): 100 mg via ORAL
  Filled 2017-01-14 (×16): qty 1

## 2017-01-14 MED ORDER — WARFARIN - PHARMACIST DOSING INPATIENT
Freq: Every day | Status: DC
  Administered 2017-01-14 – 2017-01-22 (×6)

## 2017-01-14 MED ORDER — HYDRALAZINE HCL 20 MG/ML IJ SOLN: 10.0000 mg | Freq: Three times a day (TID) | INTRAMUSCULAR | Status: DC | PRN

## 2017-01-14 MED ORDER — ALBUTEROL SULFATE (2.5 MG/3ML) 0.083% IN NEBU: 2.5000 mg | INHALATION_SOLUTION | RESPIRATORY_TRACT | Status: DC | PRN

## 2017-01-14 MED ORDER — AMIODARONE HCL 200 MG PO TABS
200.0000 mg | ORAL_TABLET | Freq: Every day | ORAL | Status: DC
  Administered 2017-01-14 – 2017-01-23 (×10): 200 mg via ORAL
  Filled 2017-01-14 (×10): qty 1

## 2017-01-14 MED ORDER — ASPIRIN EC 81 MG PO TBEC
81.0000 mg | DELAYED_RELEASE_TABLET | Freq: Every day | ORAL | Status: DC
  Administered 2017-01-14 – 2017-01-25 (×12): 81 mg via ORAL
  Filled 2017-01-14 (×12): qty 1

## 2017-01-14 NOTE — H&P (Signed)
[facility] History and Physical  Alexandria Chen:[id number] DOB: [birth year] DOA: 01/28/2017  Referring physician:  PCP: [name], FNP   Chief Complaint: "She couldn't walk far without getting short of breath."-Daughter  HPI: Alexandria Chen is a 82 y.o. female  with past medical history of atrial fibrillation anxiety depression hypertension and lung mass who recently finished chemotherapy who presents with shortness of breath. Patient was recently treated for lung infection in Aug. Comes in today due to continued weakness. Slightly decreased by mouth intake. Was admitted inpatient rehabilitation. Daughter alarmed because patient was diaphoretic and had difficulty just ambulating across the room. Rescue squad activated the patient found to be hypoxic. Brought to the emergency room for evaluation.  ED course: X-ray of the chest showed likely pneumonia. Patient started on antibiotics. Hospitalists will do for admission.   Review of Systems:  As per HPI otherwise 10 point review of systems negative.    Past Medical History:  Diagnosis Date  . A-fib ([place])   . Alcohol abuse, in remission   . Anxiety   . Atrial fibrillation ([place])   . Depression   . Dyspnea    walking distances   . Dysrhythmia   . History of bronchitis   . HTN (hypertension)   . Mass of right lung 07/13/2016  . Osteopenia   . Squamous cell carcinoma of right lung ([place]) 07/13/2016   Past Surgical History:  Procedure Laterality Date  . IR FLUORO GUIDE PORT INSERTION RIGHT  09/17/2016  . IR US GUIDE VASC ACCESS RIGHT  09/17/2016   Social History:  reports that she quit smoking about 6 months ago. Her smoking use included Cigarettes. She started smoking about 60 years ago. She has a 120.00 pack-year smoking history. She has never used smokeless tobacco. She reports that she does not drink alcohol or use drugs.  No Known Allergies  Family History  Problem Relation Age of Onset  . Anxiety disorder Mother   .  Dementia Mother   . Hypertension Mother   . Alcohol abuse Father   . Arthritis Father   . Alcohol abuse Brother   . ADD / ADHD Neg Hx   . Drug abuse Neg Hx   . Bipolar disorder Neg Hx   . Depression Neg Hx   . OCD Neg Hx   . Paranoid behavior Neg Hx   . Schizophrenia Neg Hx   . Seizures Neg Hx   . Sexual abuse Neg Hx   . Physical abuse Neg Hx      Prior to Admission medications   Medication Sig Start Date End Date Taking? Authorizing Provider  acetaminophen (TYLENOL) 325 MG tablet Take 2 tablets (650 mg total) by mouth every 4 (four) hours as needed for mild pain, moderate pain, fever or headache. 12/06/16   Orvan Falconer, MD  amiodarone (PACERONE) 200 MG tablet Take 200 mg by mouth daily.  08/04/16   [provider]  aspirin EC 81 MG tablet Take 81 mg by mouth daily.    [provider]  atorvastatin (LIPITOR) 40 MG tablet TAKE 1 TABLET (40 MG TOTAL) BY MOUTH DAILY. FOR HYPERLIPIDEMIA 05/29/16   Hawks, Alyse Low A, FNP  busPIRone (BUSPAR) 15 MG tablet Take 1 tablet (15 mg total) by mouth 2 (two) times daily. 08/02/16   Cloria Spring, MD  DULoxetine (CYMBALTA) 60 MG capsule Take 1 capsule (60 mg total) by mouth daily. 08/02/16 08/02/17  Cloria Spring, MD  fish oil-omega-3 fatty acids 1000  MG capsule Take 3 capsules (3 g total) by mouth daily. For hyperlipidemia. Patient taking differently: Take 1 g by mouth 3 (three) times daily. For hyperlipidemia. 10/19/11   Ruben Im, PA-C  fluticasone (FLONASE) 50 MCG/ACT nasal spray Place 2 sprays into both nostrils 2 (two) times daily. Use as needed Patient taking differently: Place 2 sprays into both nostrils 2 (two) times daily as needed for allergies. Use as needed 11/05/15   Evelina Dun A, FNP  gabapentin (NEURONTIN) 100 MG capsule Take 1 capsule (100 mg total) by mouth at bedtime. 08/02/16   Cloria Spring, MD  nitroGLYCERIN (NITROSTAT) 0.4 MG SL tablet Place 0.4 mg under the tongue. 07/13/16   [provider]  omeprazole  (PRILOSEC) 40 MG capsule Take 1 capsule (40 mg total) by mouth daily. 11/01/16   Twana First, MD  warfarin (COUMADIN) 2.5 MG tablet Take 3 mg by mouth daily.     [provider]   Physical Exam: Vitals:   01/18/2017 0330 01/02/2017 0411 01/10/2017 0500 01/09/2017 0530  BP: 122/66  (!) 142/80 138/80  Pulse: 89  89 88  Resp: (!) 24  (!) 21 (!) 22  Temp:      TempSrc:      SpO2: 98% 95% 98% 93%  Weight:      Height:        Wt Readings from Last 3 Encounters:  01/26/2017 57.2 kg (126 lb)  01/11/17 58.1 kg (128 lb)  01/06/17 58.6 kg (129 lb 3.2 oz)    General:  Appears calm and comfortable; a&Ox3 but needed prompting for year Eyes:  PERRL, EOMI, normal lids, iris ENT:  grossly normal hearing, lips & tongue Neck:  no LAD, masses or thyromegaly Cardiovascular:  RRR, no m/r/g. No LE edema.  Respiratory:  CTA bilaterally, no w/r/r. Normal respiratory effort. Abdomen:  soft, ntnd Skin:  no rash or induration seen on limited exam Musculoskeletal:  grossly normal tone BUE/BLE Psychiatric:  grossly normal mood and affect, speech fluent and appropriate Neurologic:  CN 2-12 grossly intact, moves all extremities in coordinated fashion.          Labs on Admission:  Basic Metabolic Panel:  Recent Labs Lab 01/27/2017 0403  NA 135  K 3.3*  CL 101  CO2 25  GLUCOSE 117*  BUN 14  CREATININE 0.94  CALCIUM 8.8*   Liver Function Tests: No results for input(s): AST, ALT, ALKPHOS, BILITOT, PROT, ALBUMIN in the last 168 hours. No results for input(s): LIPASE, AMYLASE in the last 168 hours. No results for input(s): AMMONIA in the last 168 hours. CBC:  Recent Labs Lab 01/12/2017 0403  WBC 9.5  NEUTROABS 7.2  HGB 9.2*  HCT 28.4*  MCV 89.9  PLT 417*   Cardiac Enzymes: No results for input(s): CKTOTAL, CKMB, CKMBINDEX, TROPONINI in the last 168 hours.  BNP (last 3 results) No results for input(s): BNP in the last 8760 hours.  ProBNP (last 3 results) No results for input(s): PROBNP  in the last 8760 hours.   Serum creatinine: 0.94 mg/dL 01/05/2017 0403 Estimated creatinine clearance: 39.5 mL/min  CBG: No results for input(s): GLUCAP in the last 168 hours.  Radiological Exams on Admission: Dg Chest 2 View  Result Date: 01/10/2017 CLINICAL DATA:  Shortness of breath.  Cough and wheezing. EXAM: CHEST  2 VIEW COMPARISON:  CT 2 days prior.  Radiograph 12/29/2016 FINDINGS: Tip of the right chest port in the mid SVC. Post treatment related changes in the right upper lobe.  Consolidative opacity anteriorly on CT not well seen radiographically. Persistent right lung volume loss. Mild cardiomegaly with atherosclerosis of the thoracic aorta. Ill-defined left mid and lower lung opacities core spine a ground-glass opacities on prior CT. No pneumothorax or pleural effusion. Stable osseous structures. IMPRESSION: 1. Similar appearance of the chest from recent CT allowing for differences in modality. Post treatment changes in the right upper lobe. Left mid lower lung zone opacities corresponding to ground-glass on CT, may be infectious or inflammatory in etiology. 2. Mild cardiomegaly.  Aortic atherosclerosis. Electronically Signed   By: Jeb Levering M.D.   On: 01/24/2017 04:28   Ct Chest Wo Contrast  Result Date: 01/12/2017 CLINICAL DATA:  Right lung cancer, recent pneumonia, cough and shortness of breath. EXAM: CT CHEST WITHOUT CONTRAST TECHNIQUE: Multidetector CT imaging of the chest was performed following the standard protocol without IV contrast. COMPARISON:  12/14/2016 and PET 07/07/2016. FINDINGS: Cardiovascular: Right IJ Port-A-Cath terminates in the low SVC. Atherosclerotic calcification of the arterial vasculature, including three-vessel involvement of the coronary arteries. Heart is at the upper limits of normal in size to mildly enlarged. No pericardial effusion. Mediastinum/Nodes: Supraclavicular and mediastinal lymph nodes are not enlarged by CT size criteria. Hilar regions are  difficult to evaluate without IV contrast. No axillary adenopathy. Esophagus is grossly unremarkable. Lungs/Pleura: Centrilobular and paraseptal emphysema. Patchy ground-glass is seen in the lungs bilaterally, worst in the right upper lobe, as before. Ground-glass in the left lung has increased slightly, however. Spiculated soft tissue consolidation in the anterior right upper lobe measures 1.9 x 2.7 cm, likely stable. Resolving collapse/ consolidation in the right lower lobe with residual ground-glass and minimal consolidation. No pleural fluid. Airway is unremarkable. Upper Abdomen: Visualized portion of the liver is unremarkable. 9 mm nodule in the medial limb right adrenal gland measures 20 Hounsfield units, stable in size from 12/14/2016 and fluid in density on 07/07/2016. There may be nodular thickening of the left adrenal gland. Visualized portions of the kidneys, spleen, pancreas, stomach and bowel are grossly unremarkable. Musculoskeletal: Degenerative changes in the spine. IMPRESSION: 1. Posttreatment changes in the upper right hemithorax with residual nodular consolidation anteriorly, stable. 2. Resolving collapse/consolidation in the right lower lobe without complete resolution. 3. Multifocal pulmonary parenchymal ground-glass, some of which is new and likely infectious or inflammatory in etiology. 4. Aortic atherosclerosis (ICD10-170.0). Coronary artery calcification. 5.  Emphysema (ICD10-J43.9). 6. Right adrenal adenoma. Electronically Signed   By: Lorin Picket M.D.   On: 01/12/2017 14:51    EKG: Independently reviewed. NSR. No stemi.  Assessment/Plan Principal Problem:   Hypoxia Active Problems:   Depression   Essential hypertension, benign   Hyperlipidemia   Atrial fibrillation (HCC) [I48.91]  SOB/Hypoxia Scheduled DuoNeb's When necessary albuterol Antibiotic-cefepime Oxygen therapy Continuous pulse oximetry FV, IS Mucinex RT consult Consider Pulm consult - may need review  of CT for possible brochial stent Walk tests  Low K Will replete PO Recheck in AM tomorrow  Mild dehdyration IVF Due to illness  Anemia Chronic and stable on admit  Hyperlipidemia Continue statin  Hypertension When necessary hydralazine 10 mg IV as needed for severe blood pressure  Depression w/ anxiety No SI/HI Cont buspar, cymbalta  Afib Warfarin consult Cont amiodarone  GERD Cont PPI  Chronic pain Cont neurontin  CAD Prn ntg sl  Code Status: FULL  DVT Prophylaxis: warfarin Family Communication: dgtr at bedside Disposition Plan: Pending Improvement  Status: obs, med-surg  Elwin Mocha, MD Family Medicine [facility] [email or web address] Password [email or web address]

## 2017-01-14 NOTE — ED Triage Notes (Signed)
Pt in by [facility] for sob, wheezing onset tonight. Pt with history of lung cancer.

## 2017-01-14 NOTE — Progress Notes (Signed)
Pharmacy Antibiotic Note  Alexandria Chen is a 82 y.o. female admitted on 01/23/2017 with pneumonia.  Pharmacy has been consulted for Cefepime dosing.  Plan: Cefepime 2gm Loading dose, then 1gm IV q24h F/U cxs and clinical progress Monitor V/S and labs  Height: 5\' 3"  (160 cm) Weight: 131 lb 6.3 oz (59.6 kg) IBW/kg (Calculated) : 52.4  Temp (24hrs), Avg:98.3 F (36.8 C), Min:98.2 F (36.8 C), Max:98.4 F (36.9 C)   Recent Labs Lab 01/16/2017 0403  WBC 9.5  CREATININE 0.94    Estimated Creatinine Clearance: 39.5 mL/min (by C-G formula based on SCr of 0.94 mg/dL).    No Known Allergies  Antimicrobials this admission: Cefepime 9/14>>   Dose adjustments this admission: N/A  Microbiology results: 9/14 BCx: pending  Thank you for allowing pharmacy to be a part of this patient's care.  Isac Sarna, BS Pharm D, BCPS Clinical Pharmacist Pager [phone] 01/09/2017 8:15 AM

## 2017-01-14 NOTE — Care Management Obs Status (Signed)
[place] NOTIFICATION   Patient Details  Name: VERNEDA HOLLOPETER MRN: [id number] Date of Birth: 05-Mar-1935   Medicare Observation Status Notification Given:  Yes    Sherald Barge, RN 01/27/2017, 9:43 AM

## 2017-01-14 NOTE — Progress Notes (Signed)
ANTICOAGULATION CONSULT NOTE - Initial Consult  Pharmacy Consult for Coumadin Indication: atrial fibrillation  No Known Allergies  Patient Measurements: Height: 5\' 3"  (160 cm) Weight: 131 lb 6.3 oz (59.6 kg) IBW/kg (Calculated) : 52.4   Vital Signs: Temp: 98.2 F (36.8 C) (09/14 0800) Temp Source: Oral (09/14 0800) BP: 125/72 (09/14 0800) Pulse Rate: 84 (09/14 0800)  Labs:  Recent Labs  01/03/2017 0403  HGB 9.2*  HCT 28.4*  PLT 417*  LABPROT 29.0*  INR 2.76  CREATININE 0.94    Estimated Creatinine Clearance: 39.5 mL/min (by C-G formula based on SCr of 0.94 mg/dL).   Medical History: Past Medical History:  Diagnosis Date  . A-fib ([place])   . Alcohol abuse, in remission   . Anxiety   . Atrial fibrillation ([place])   . Depression   . Dyspnea    walking distances   . Dysrhythmia   . History of bronchitis   . HTN (hypertension)   . Mass of right lung 07/13/2016  . Osteopenia   . Squamous cell carcinoma of right lung ([place]) 07/13/2016    Medications:  Prescriptions Prior to Admission  Medication Sig Dispense Refill Last Dose  . acetaminophen (TYLENOL) 325 MG tablet Take 2 tablets (650 mg total) by mouth every 4 (four) hours as needed for mild pain, moderate pain, fever or headache. 100 tablet 1 Taking  . amiodarone (PACERONE) 200 MG tablet Take 200 mg by mouth daily.    Taking  . aspirin EC 81 MG tablet Take 81 mg by mouth daily.   Taking  . atorvastatin (LIPITOR) 40 MG tablet TAKE 1 TABLET (40 MG TOTAL) BY MOUTH DAILY. FOR HYPERLIPIDEMIA 90 tablet 1 Taking  . busPIRone (BUSPAR) 15 MG tablet Take 1 tablet (15 mg total) by mouth 2 (two) times daily. 180 tablet 2 Taking  . DULoxetine (CYMBALTA) 60 MG capsule Take 1 capsule (60 mg total) by mouth daily. 30 capsule 2 Taking  . fish oil-omega-3 fatty acids 1000 MG capsule Take 3 capsules (3 g total) by mouth daily. For hyperlipidemia. (Patient taking differently: Take 1 g by mouth 3 (three) times daily. For hyperlipidemia.)  30 capsule  Taking  . fluticasone (FLONASE) 50 MCG/ACT nasal spray Place 2 sprays into both nostrils 2 (two) times daily. Use as needed (Patient taking differently: Place 2 sprays into both nostrils 2 (two) times daily as needed for allergies. Use as needed) 16 g 6 Taking  . gabapentin (NEURONTIN) 100 MG capsule Take 1 capsule (100 mg total) by mouth at bedtime. 90 capsule 2 Taking  . nitroGLYCERIN (NITROSTAT) 0.4 MG SL tablet Place 0.4 mg under the tongue.   Taking  . omeprazole (PRILOSEC) 40 MG capsule Take 1 capsule (40 mg total) by mouth daily. 30 capsule 2 Taking  . warfarin (COUMADIN) 2.5 MG tablet Take 3 mg by mouth daily.    Taking    Assessment: 82 y.o. female  with past medical history of atrial fibrillation,  Anxiety,  Depression, hypertension and lung mass who recently finished chemotherapy. Patient presents with shortness of breath. Pharmacy asked to dose Coumadin. Patient is followed by anticoag clinic and takes 3mg  on Sunday and Thursday  And 1.5mg  On Monday, Tuesday, Wednesday, Friday and Saturday. Patient's INR is therapeutic today.  Goal of Therapy:  INR 2-3 Monitor platelets by anticoagulation protocol: Yes   Plan:  Coumadin 1.5mg  po x 1 PT-INR daily Monitor for S/S of bleeding  Isac Sarna, BS [place], BCPS Clinical Pharmacist Pager [phone] 01/08/2017,8:24 AM

## 2017-01-14 NOTE — ED Provider Notes (Signed)
[facility] DEPT Provider Note   CSN: [id number] Arrival date & time: 01/25/2017  0249     History   Chief Complaint Chief Complaint  Patient presents with  . Shortness of Breath    HPI Alexandria Chen is a 82 y.o. female.  The history is provided by the patient.  Shortness of Breath   She has a history of right upper lobe non-small cell cancer which is been treated with radiation and chemotherapy. She was recently treated for pneumonia. Over the last 24 hours, she has had progressive dyspnea with nonproductive cough. She has not had any fever or chills, but has had sweats. She denies chest pain, nausea, vomiting. She did not do anything that treat herself at home. EMS gave her a nebulizer treatment and she is feeling considerably better. EMS did report hypoxia at home with initial oxygen saturation in the 80s, significant improved with supplemental oxygen.  Past Medical History:  Diagnosis Date  . A-fib ([place])   . Alcohol abuse, in remission   . Anxiety   . Atrial fibrillation ([place])   . Depression   . Dyspnea    walking distances   . Dysrhythmia   . History of bronchitis   . HTN (hypertension)   . Mass of right lung 07/13/2016  . Osteopenia   . Squamous cell carcinoma of right lung ([place]) 07/13/2016    Patient Active Problem List   Diagnosis Date Noted  . At high risk for falls 01/06/2017  . Anemia, unspecified 11/28/2016  . Warfarin anticoagulation 11/28/2016  . Sepsis ([place]) 11/27/2016  . Syncope 11/20/2016  . Atrial fibrillation ([place]) [I48.91] 09/09/2016  . Squamous cell carcinoma of right lung ([place]) 07/13/2016  . Syncope and collapse 04/14/2015  . Otitis externa of right ear 12/18/2014  . Hearing loss due to cerumen impaction 12/18/2014  . Essential hypertension, benign 01/14/2014  . Hyperlipidemia 01/14/2014  . Vitamin D deficiency 01/14/2014  . Alcohol dependence in remission ([place]) 10/15/2011    Class: Acute  . Depression 08/31/2011  . Anxiety 08/31/2011     Past Surgical History:  Procedure Laterality Date  . IR FLUORO GUIDE PORT INSERTION RIGHT  09/17/2016  . IR US GUIDE VASC ACCESS RIGHT  09/17/2016    OB History    No data available       Home Medications    Prior to Admission medications   Medication Sig Start Date End Date Taking? Authorizing Provider  acetaminophen (TYLENOL) 325 MG tablet Take 2 tablets (650 mg total) by mouth every 4 (four) hours as needed for mild pain, moderate pain, fever or headache. 12/06/16   Orvan Falconer, MD  amiodarone (PACERONE) 200 MG tablet Take 200 mg by mouth daily.  08/04/16   [provider]  aspirin EC 81 MG tablet Take 81 mg by mouth daily.    [provider]  atorvastatin (LIPITOR) 40 MG tablet TAKE 1 TABLET (40 MG TOTAL) BY MOUTH DAILY. FOR HYPERLIPIDEMIA 05/29/16   Hawks, Alyse Low A, FNP  busPIRone (BUSPAR) 15 MG tablet Take 1 tablet (15 mg total) by mouth 2 (two) times daily. 08/02/16   Cloria Spring, MD  DULoxetine (CYMBALTA) 60 MG capsule Take 1 capsule (60 mg total) by mouth daily. 08/02/16 08/02/17  Cloria Spring, MD  fish oil-omega-3 fatty acids 1000 MG capsule Take 3 capsules (3 g total) by mouth daily. For hyperlipidemia. Patient taking differently: Take 1 g by mouth 3 (three) times daily. For hyperlipidemia. 10/19/11   Ruben Im,  PA-C  fluticasone (FLONASE) 50 MCG/ACT nasal spray Place 2 sprays into both nostrils 2 (two) times daily. Use as needed Patient taking differently: Place 2 sprays into both nostrils 2 (two) times daily as needed for allergies. Use as needed 11/05/15   Evelina Dun A, FNP  gabapentin (NEURONTIN) 100 MG capsule Take 1 capsule (100 mg total) by mouth at bedtime. 08/02/16   Cloria Spring, MD  nitroGLYCERIN (NITROSTAT) 0.4 MG SL tablet Place 0.4 mg under the tongue. 07/13/16   [provider]  omeprazole (PRILOSEC) 40 MG capsule Take 1 capsule (40 mg total) by mouth daily. 11/01/16   Twana First, MD  warfarin (COUMADIN) 2.5 MG tablet Take 3 mg  by mouth daily.     [provider]    Family History Family History  Problem Relation Age of Onset  . Anxiety disorder Mother   . Dementia Mother   . Hypertension Mother   . Alcohol abuse Father   . Arthritis Father   . Alcohol abuse Brother   . ADD / ADHD Neg Hx   . Drug abuse Neg Hx   . Bipolar disorder Neg Hx   . Depression Neg Hx   . OCD Neg Hx   . Paranoid behavior Neg Hx   . Schizophrenia Neg Hx   . Seizures Neg Hx   . Sexual abuse Neg Hx   . Physical abuse Neg Hx     Social History Social History  Substance Use Topics  . Smoking status: Former Smoker    Packs/day: 2.00    Years: 60.00    Types: Cigarettes    Start date: 05/03/1956    Quit date: 07/15/2016  . Smokeless tobacco: Never Used  . Alcohol use No     Comment: Patient reports no alcohol use since 05/24/2016.     Allergies   Patient has no known allergies.   Review of Systems Review of Systems  Respiratory: Positive for shortness of breath.   All other systems reviewed and are negative.    Physical Exam Updated Vital Signs BP 122/66   Pulse 89   Temp 98.4 F (36.9 C) (Oral)   Resp (!) 24   Ht 5\' 3"  (1.6 m)   Wt 57.2 kg (126 lb)   SpO2 98%   BMI 22.32 kg/m   Physical Exam  Nursing note and vitals reviewed.  82 year old female, resting comfortably and in no acute distress. Vital signs are significant for tachypnea. Oxygen saturation is 98%, which is normal. Head is normocephalic and atraumatic. PERRLA, EOMI. Oropharynx is clear. Neck is nontender and supple without adenopathy or JVD. Back is nontender and there is no CVA tenderness. Lungshave faint expiratory wheezes diffusely. There are no rales  or rhonchi. Chest is nontender. Heart has regular rate and rhythm without murmur. Abdomen is soft, flat, nontender without masses or hepatosplenomegaly and peristalsis is normoactive. Extremities have no cyanosis or edema, full range of motion is present. Skin is warm and dry  without rash. Neurologic: Mental status is normal, cranial nerves are intact, there are no motor or sensory deficits.  ED Treatments / Results  Labs (all labs ordered are listed, but only abnormal results are displayed) Labs Reviewed  CBC WITH DIFFERENTIAL/PLATELET - Abnormal; Notable for the following:       Result Value   RBC 3.16 (*)    Hemoglobin 9.2 (*)    HCT 28.4 (*)    RDW 16.1 (*)    Platelets 417 (*)  Monocytes Absolute 1.2 (*)    All other components within normal limits  BASIC METABOLIC PANEL - Abnormal; Notable for the following:    Potassium 3.3 (*)    Glucose, Bld 117 (*)    Calcium 8.8 (*)    GFR calc non Af Amer 56 (*)    All other components within normal limits  PROTIME-INR - Abnormal; Notable for the following:    Prothrombin Time 29.0 (*)    All other components within normal limits    EKG  EKG Interpretation  Date/Time:  Friday January 14 2017 02:56:26 EDT Ventricular Rate:  93 PR Interval:    QRS Duration: 97 QT Interval:  362 QTC Calculation: 451 R Axis:   24 Text Interpretation:  Sinus rhythm Atrial premature complex Abnormal R-wave progression, early transition Nonspecific T wave abnormality When compared with ECG of 11/27/2016, No significant change was found Confirmed by Delora Fuel ([id number]) on 01/28/2017 3:07:37 AM       Radiology Dg Chest 2 View  Result Date: 01/08/2017 CLINICAL DATA:  Shortness of breath.  Cough and wheezing. EXAM: CHEST  2 VIEW COMPARISON:  CT 2 days prior.  Radiograph 12/29/2016 FINDINGS: Tip of the right chest port in the mid SVC. Post treatment related changes in the right upper lobe. Consolidative opacity anteriorly on CT not well seen radiographically. Persistent right lung volume loss. Mild cardiomegaly with atherosclerosis of the thoracic aorta. Ill-defined left mid and lower lung opacities core spine a ground-glass opacities on prior CT. No pneumothorax or pleural effusion. Stable osseous structures. IMPRESSION:  1. Similar appearance of the chest from recent CT allowing for differences in modality. Post treatment changes in the right upper lobe. Left mid lower lung zone opacities corresponding to ground-glass on CT, may be infectious or inflammatory in etiology. 2. Mild cardiomegaly.  Aortic atherosclerosis. Electronically Signed   By: Jeb Levering M.D.   On: 01/12/2017 04:28   Ct Chest Wo Contrast  Result Date: 01/12/2017 CLINICAL DATA:  Right lung cancer, recent pneumonia, cough and shortness of breath. EXAM: CT CHEST WITHOUT CONTRAST TECHNIQUE: Multidetector CT imaging of the chest was performed following the standard protocol without IV contrast. COMPARISON:  12/14/2016 and PET 07/07/2016. FINDINGS: Cardiovascular: Right IJ Port-A-Cath terminates in the low SVC. Atherosclerotic calcification of the arterial vasculature, including three-vessel involvement of the coronary arteries. Heart is at the upper limits of normal in size to mildly enlarged. No pericardial effusion. Mediastinum/Nodes: Supraclavicular and mediastinal lymph nodes are not enlarged by CT size criteria. Hilar regions are difficult to evaluate without IV contrast. No axillary adenopathy. Esophagus is grossly unremarkable. Lungs/Pleura: Centrilobular and paraseptal emphysema. Patchy ground-glass is seen in the lungs bilaterally, worst in the right upper lobe, as before. Ground-glass in the left lung has increased slightly, however. Spiculated soft tissue consolidation in the anterior right upper lobe measures 1.9 x 2.7 cm, likely stable. Resolving collapse/ consolidation in the right lower lobe with residual ground-glass and minimal consolidation. No pleural fluid. Airway is unremarkable. Upper Abdomen: Visualized portion of the liver is unremarkable. 9 mm nodule in the medial limb right adrenal gland measures 20 Hounsfield units, stable in size from 12/14/2016 and fluid in density on 07/07/2016. There may be nodular thickening of the left adrenal  gland. Visualized portions of the kidneys, spleen, pancreas, stomach and bowel are grossly unremarkable. Musculoskeletal: Degenerative changes in the spine. IMPRESSION: 1. Posttreatment changes in the upper right hemithorax with residual nodular consolidation anteriorly, stable. 2. Resolving collapse/consolidation in the right lower lobe  without complete resolution. 3. Multifocal pulmonary parenchymal ground-glass, some of which is new and likely infectious or inflammatory in etiology. 4. Aortic atherosclerosis (ICD10-170.0). Coronary artery calcification. 5.  Emphysema (ICD10-J43.9). 6. Right adrenal adenoma. Electronically Signed   By: Lorin Picket M.D.   On: 01/12/2017 14:51    Procedures Procedures (including critical care time)  Medications Ordered in ED Medications  ipratropium-albuterol (DUONEB) 0.5-2.5 (3) MG/3ML nebulizer solution 3 mL (not administered)     Initial Impression / Assessment and Plan / ED Course  I have reviewed the triage vital signs and the nursing notes.  Pertinent labs & imaging results that were available during my care of the patient were reviewed by me and considered in my medical decision making (see chart for details).  Dyspnea with bronchospasm. Old records are reviewed, and she had a plastic surgery consult 2 days ago of 2 evaluate for possible biopsy of right lower lobe lesion. She was sent for CT of the chest which showed no residual lesion, no need for biopsy. CT did show some groundglass densities which were worrisome for infection. Will repeat chest x-ray and give additional albuterol with ipratropium. Will also give methylprednisolone.  Following above-noted treatment, lungs are completely clear. Chest x-ray continues to show some groundglass densities consistent with either inflammation or pneumonia. She is having ongoing problems with sweats which suggest an infectious etiology. Laboratory workup shows mild hypokalemia which may be related to albuterol  administration. She's given supplemental potassium. She started on cefepime for healthcare associated pneumonia. Of note, he lungs were clear, when taken off of supplemental oxygen, oxygen saturations dropped to 85%. She is not felt to be safe to go home because of hypoxia and lack of supplemental oxygen at home. Case is discussed with Dr. Aggie Moats of [facility] who agrees to admit the patient.  Final Clinical Impressions(s) / ED Diagnoses   Final diagnoses:  HCAP (healthcare-associated pneumonia)  Bronchospasm  Hypokalemia  Normochromic normocytic anemia  Anticoagulated on warfarin    New Prescriptions New Prescriptions   No medications on file     Delora Fuel, MD 95/18/84 [phone]

## 2017-01-14 NOTE — Progress Notes (Addendum)
Patient seen and examined in the presence of 2 daughters by the bedside  Patient admitted for COPD exacerbation in the setting of recent pneumonia which still hasn't cleared up Patient was recently seen by cardiothoracic surgery and evaluated for recurrent right lower lobe mass versus infiltrate. CT chest on 9/12 showed posttreatment changes with residual nodular consolidation, resolving collapse/consolidation of the right lower lobe without complete resolution. Multifocal pulmonary groundglass opacity some of which is new. Patient admitted for COPD exacerbation, also placed on broad-spectrum antibiotics, possibly due to residual pneumonia that needs to be treated  According to Dr. Servando Snare, patient does not need biopsy, based on CT results. This information was conveyed to the patient's daughter is by the bedside  Anticipate discharge in the next 1-2 days

## 2017-01-14 NOTE — Care Management Note (Signed)
Case Management Note  Patient Details  Name: Alexandria Chen MRN: [id number] Date of Birth: July 28, 1934  Subjective/Objective:                  Admitted with COPD exacerbation. Pt from home, lives alone, has supportive children who provide 24/7 care. Pt recently DC'd from SNF, active with [facility] for PT/OT/RN. [facility] rep is aware of admission. Pt uses RW and does not have supplemental oxygen pta.   Action/Plan: Pt plans to return home with resumption of [facility] services. [employer] rep aware of DC plan, potential DC home over weekend. Pt will need to wean from oxygen or have home O2 assessment prior to DC.   Expected Discharge Date:     01/15/2017             Expected Discharge Plan:  [place]  In-House Referral:  NA  Discharge planning Services  CM Consult  Post Acute Care Choice:  Home Health, Resumption of Svcs/PTA Provider Choice offered to:  Patient  HH Arranged:  RN, PT, OT [facility] Agency:  [facility]  Status of Service:  In process, will continue to follow   Sherald Barge, RN 01/17/2017, 11:26 AM

## 2017-01-15 DIAGNOSIS — F320 Major depressive disorder, single episode, mild: Secondary | ICD-10-CM

## 2017-01-15 DIAGNOSIS — E876 Hypokalemia: Secondary | ICD-10-CM | POA: Diagnosis not present

## 2017-01-15 DIAGNOSIS — R0902 Hypoxemia: Secondary | ICD-10-CM | POA: Diagnosis not present

## 2017-01-15 LAB — CBC WITH DIFFERENTIAL/PLATELET
BASOS ABS: 0 10*3/uL (ref 0.0–0.1)
BASOS PCT: 0 %
EOS ABS: 0 10*3/uL (ref 0.0–0.7)
Eosinophils Relative: 0 %
HCT: 25.9 % — ABNORMAL LOW (ref 36.0–46.0)
HEMOGLOBIN: 8.5 g/dL — AB (ref 12.0–15.0)
Lymphocytes Relative: 4 %
Lymphs Abs: 0.6 10*3/uL — ABNORMAL LOW (ref 0.7–4.0)
MCH: 29.6 pg (ref 26.0–34.0)
MCHC: 32.8 g/dL (ref 30.0–36.0)
MCV: 90.2 fL (ref 78.0–100.0)
Monocytes Absolute: 1.3 10*3/uL — ABNORMAL HIGH (ref 0.1–1.0)
Monocytes Relative: 9 %
NEUTROS ABS: 12.1 10*3/uL — AB (ref 1.7–7.7)
NEUTROS PCT: 87 %
Platelets: 393 10*3/uL (ref 150–400)
RBC: 2.87 MIL/uL — AB (ref 3.87–5.11)
RDW: 16.3 % — ABNORMAL HIGH (ref 11.5–15.5)
WBC: 13.9 10*3/uL — AB (ref 4.0–10.5)

## 2017-01-15 LAB — PROTIME-INR
INR: 4.1 — AB
PROTHROMBIN TIME: 39.4 s — AB (ref 11.4–15.2)

## 2017-01-15 LAB — BASIC METABOLIC PANEL
ANION GAP: 7 (ref 5–15)
BUN: 19 mg/dL (ref 6–20)
CALCIUM: 9.1 mg/dL (ref 8.9–10.3)
CO2: 24 mmol/L (ref 22–32)
CREATININE: 0.71 mg/dL (ref 0.44–1.00)
Chloride: 110 mmol/L (ref 101–111)
GFR calc non Af Amer: >60 mL/min (ref >60)
Glucose, Bld: 147 mg/dL — ABNORMAL HIGH (ref 65–99)
Potassium: 5 mmol/L (ref 3.5–5.1)
SODIUM: 141 mmol/L (ref 135–145)

## 2017-01-15 LAB — HIV ANTIBODY (ROUTINE TESTING W REFLEX): HIV Screen 4th Generation wRfx: NONREACTIVE

## 2017-01-15 NOTE — Progress Notes (Signed)
ANTICOAGULATION CONSULT NOTE -follow up  Pharmacy Consult for Coumadin Indication: atrial fibrillation  No Known Allergies  Patient Measurements: Height: 5\' 3"  (160 cm) Weight: 135 lb 12.8 oz (61.6 kg) IBW/kg (Calculated) : 52.4   Vital Signs: Temp: 98.2 F (36.8 C) (09/15 0443) Temp Source: Oral (09/15 0443) BP: 113/63 (09/15 0443) Pulse Rate: 75 (09/15 0443)  Labs:  Recent Labs  01/10/2017 0403 01/15/17 0644  HGB 9.2* 8.5*  HCT 28.4* 25.9*  PLT 417* 393  LABPROT 29.0* 39.4*  INR 2.76 4.10*  CREATININE 0.94 0.71    Estimated Creatinine Clearance: 46.4 mL/min (by C-G formula based on SCr of 0.71 mg/dL).   Medical History: Past Medical History:  Diagnosis Date  . A-fib ([place])   . Alcohol abuse, in remission   . Anxiety   . Atrial fibrillation ([place])   . Depression   . Dyspnea    walking distances   . Dysrhythmia   . History of bronchitis   . HTN (hypertension)   . Mass of right lung 07/13/2016  . Osteopenia   . Squamous cell carcinoma of right lung ([place]) 07/13/2016    Medications:  Prescriptions Prior to Admission  Medication Sig Dispense Refill Last Dose  . acetaminophen (TYLENOL) 325 MG tablet Take 2 tablets (650 mg total) by mouth every 4 (four) hours as needed for mild pain, moderate pain, fever or headache. 100 tablet 1 Past Week at Unknown time  . amiodarone (PACERONE) 200 MG tablet Take 200 mg by mouth daily.    01/13/2017 at Unknown time  . aspirin EC 81 MG tablet Take 81 mg by mouth daily.   01/13/2017 at Unknown time  . atorvastatin (LIPITOR) 40 MG tablet TAKE 1 TABLET (40 MG TOTAL) BY MOUTH DAILY. FOR HYPERLIPIDEMIA 90 tablet 1 01/13/2017 at Unknown time  . busPIRone (BUSPAR) 15 MG tablet Take 1 tablet (15 mg total) by mouth 2 (two) times daily. 180 tablet 2 01/13/2017 at Unknown time  . DULoxetine (CYMBALTA) 60 MG capsule Take 1 capsule (60 mg total) by mouth daily. 30 capsule 2 01/13/2017 at Unknown time  . fish oil-omega-3 fatty acids 1000 MG capsule  Take 3 capsules (3 g total) by mouth daily. For hyperlipidemia. (Patient taking differently: Take 1 g by mouth 3 (three) times daily. For hyperlipidemia.) 30 capsule  01/13/2017 at Unknown time  . fluticasone (FLONASE) 50 MCG/ACT nasal spray Place 2 sprays into both nostrils 2 (two) times daily. Use as needed (Patient taking differently: Place 2 sprays into both nostrils 2 (two) times daily as needed for allergies. Use as needed) 16 g 6 unknown  . gabapentin (NEURONTIN) 100 MG capsule Take 1 capsule (100 mg total) by mouth at bedtime. 90 capsule 2 01/13/2017 at Unknown time  . HYDROcodone-homatropine (HYCODAN) 5-1.5 MG/5ML syrup Take 5 mLs by mouth every 6 (six) hours as needed for cough.   01/13/2017 at Unknown time  . nitroGLYCERIN (NITROSTAT) 0.4 MG SL tablet Place 0.4 mg under the tongue.   unknown  . omeprazole (PRILOSEC) 40 MG capsule Take 1 capsule (40 mg total) by mouth daily. 30 capsule 2 01/13/2017 at Unknown time  . warfarin (COUMADIN) 2.5 MG tablet Take 3 mg by mouth daily.    01/13/2017 at 0730    Assessment: 82 y.o. female  with past medical history of atrial fibrillation,  Anxiety,  Depression, hypertension and lung mass who recently finished chemotherapy. Patient presents with shortness of breath. Pharmacy asked to dose Coumadin. Patient is followed by anticoag clinic and  takes 3mg  on Sunday and Thursday  And 1.5mg  On Monday, Tuesday, Wednesday, Friday and Saturday. Patient's INR is supratherapeutic today.  Goal of Therapy:  INR 2-3 Monitor platelets by anticoagulation protocol: Yes   Plan:  No coumadin today PT-INR daily Monitor for S/S of bleeding  Isac Sarna, BS Vena [place], BCPS Clinical Pharmacist Pager [phone] 01/15/2017,8:44 AM

## 2017-01-15 NOTE — Progress Notes (Signed)
[facility] PROGRESS NOTE  Alexandria Chen TWK:[id number] DOB: 05/18/1934 DOA: 01/28/2017   PCP: [name], FNP     Assessment/Plan: Principal Problem:   Hypoxia Active Problems:   Depression   Essential hypertension, benign   Hyperlipidemia   Atrial fibrillation (HCC) [I48.91]   HCAP (healthcare-associated pneumonia)   Hypokalemia     82 y.o. female  with past medical history of atrial fibrillation, anxiety , depression, hypertension , COPD and lung mass who recently finished chemotherapy who presents with shortness of breath in the setting of recent pneumonia which still hasn't cleared up. Patient was recently seen by cardiothoracic surgery and evaluated for recurrent right lower lobe mass versus infiltrate. CT chest on 9/12 showed posttreatment changes with residual nodular consolidation, resolving collapse/consolidation of the right lower lobe without complete resolution. Multifocal pulmonary groundglass opacity some of which is new. Patient admitted for COPD exacerbation, also placed on broad-spectrum antibiotics, possibly due to residual pneumonia that needs to be treated  According to Dr. Servando Snare, patient does not need biopsy, based on CT results.   Assessment and plan  Right lower lobe pneumonia Scheduled DuoNeb's When necessary albuterol Antibiotic-cefepime Oxygen therapy, currently on 3 L of oxygen Continuous pulse oximetry Mucinex Slowly improving,wean o2 Anticipate dc in am   Leukocytosis-white count slightly up today likely secondary to prednisone  Hypokalemia-repleted    Mild dehdyration IVF Due to illness  Anemia-hemoglobin drop from 9.2> 8.5 Chronic and stable , no signs of active bleeding  Hyperlipidemia Continue statin  Hypertension When necessary hydralazine 10 mg IV as needed for severe blood pressure  Depression w/ anxiety No SI/HI Cont buspar, cymbalta  Afib-rate controlled Warfarin  per pharmacy,INR supra  therapeutic  Cont amiodarone  GERD Cont PPI   Chronic pain Cont neurontin  CAD Continue statin    DVT prophylaxsis Coumadin  Code Status:  Full code    Family Communication: Discussed in detail with the patient, all imaging results, lab results explained to the patient   Disposition Plan: Discharge Likely tomorrow       Consultants:  None  Procedures:  None  Antibiotics: Anti-infectives    Start     Dose/Rate Route Frequency Ordered Stop   01/15/17 0600  ceFEPIme (MAXIPIME) 1 g in dextrose 5 % 50 mL IVPB     1 g 100 mL/hr over 30 Minutes Intravenous Every 24 hours 01/26/2017 0819     01/23/2017 0530  cefTRIAXone (ROCEPHIN) 1 g in dextrose 5 % 50 mL IVPB  Status:  Discontinued     1 g 100 mL/hr over 30 Minutes Intravenous  Once 01/13/2017 0518 01/23/2017 0526   01/05/2017 0530  ceFEPIme (MAXIPIME) 2 g in dextrose 5 % 50 mL IVPB     2 g 100 mL/hr over 30 Minutes Intravenous  Once 01/28/2017 0526 01/20/2017 0633         HPI/Subjective: somewhat improved still on 3L of oxygen , symptomatically improved   Objective: Vitals:   01/25/2017 1923 01/18/2017 2125 01/15/17 0443 01/15/17 0804  BP:  127/64 113/63   Pulse:  74 75   Resp:  20 20   Temp:  97.8 F (36.6 C) 98.2 F (36.8 C)   TempSrc:  Oral Oral   SpO2: 95% 100% 100% 97%  Weight:   61.6 kg (135 lb 12.8 oz)   Height:        Intake/Output Summary (Last 24 hours) at 01/15/17 1003 Last data filed at 01/15/17 0747  Gross  per 24 hour  Intake          1633.75 ml  Output                0 ml  Net          1633.75 ml    Exam:  Examination:  General exam: Appears calm and comfortable  Respiratory system: Clear to auscultation. Respiratory effort normal. Cardiovascular system: S1 & S2 heard, RRR. No JVD, murmurs, rubs, gallops or clicks. No pedal edema. Gastrointestinal system: Abdomen is nondistended, soft and nontender. No organomegaly or masses felt. Normal bowel sounds heard. Central nervous system: Alert  and oriented. No focal neurological deficits. Extremities: Symmetric 5 x 5 power. Skin: No rashes, lesions or ulcers Psychiatry: Judgement and insight appear normal. Mood & affect appropriate.     Data Reviewed: I have personally reviewed following labs and imaging studies  Micro Results Recent Results (from the past 240 hour(s))  Culture, blood (routine x 2) Call MD if unable to obtain prior to antibiotics being given     Status: None (Preliminary result)   Collection Time: 01/19/2017  7:28 AM  Result Value Ref Range Status   Specimen Description BLOOD RIGHT ARM  Final   Special Requests   Final    BOTTLES DRAWN AEROBIC AND ANAEROBIC Blood Culture adequate volume   Culture NO GROWTH < 24 HOURS  Final   Report Status PENDING  Incomplete  Culture, blood (routine x 2) Call MD if unable to obtain prior to antibiotics being given     Status: None (Preliminary result)   Collection Time: 01/23/2017  7:33 AM  Result Value Ref Range Status   Specimen Description BLOOD RIGHT WRIST  Final   Special Requests   Final    BOTTLES DRAWN AEROBIC AND ANAEROBIC Blood Culture adequate volume   Culture NO GROWTH < 24 HOURS  Final   Report Status PENDING  Incomplete    Radiology Reports Dg Chest 2 View  Result Date: 01/02/2017 CLINICAL DATA:  Shortness of breath.  Cough and wheezing. EXAM: CHEST  2 VIEW COMPARISON:  CT 2 days prior.  Radiograph 12/29/2016 FINDINGS: Tip of the right chest port in the mid SVC. Post treatment related changes in the right upper lobe. Consolidative opacity anteriorly on CT not well seen radiographically. Persistent right lung volume loss. Mild cardiomegaly with atherosclerosis of the thoracic aorta. Ill-defined left mid and lower lung opacities core spine a ground-glass opacities on prior CT. No pneumothorax or pleural effusion. Stable osseous structures. IMPRESSION: 1. Similar appearance of the chest from recent CT allowing for differences in modality. Post treatment changes in  the right upper lobe. Left mid lower lung zone opacities corresponding to ground-glass on CT, may be infectious or inflammatory in etiology. 2. Mild cardiomegaly.  Aortic atherosclerosis. Electronically Signed   By: Jeb Levering M.D.   On: 01/28/2017 04:28   Dg Chest 2 View  Result Date: 12/30/2016 CLINICAL DATA:  Pneumonia, follow-up EXAM: CHEST  2 VIEW COMPARISON:  CT 12/15/2015.  Chest x-ray 12/02/2016. FINDINGS: Right Port-A-Cath remains in place, unchanged. Right upper lobe mass again noted. Previously seen left upper lobe pneumonia has cleared. No effusions or acute airspace opacities. Heart is normal size. IMPRESSION: Interval clearance of the left upper lobe pneumonia. No new confluent airspace opacities. Right upper lobe mass again noted. Electronically Signed   By: Rolm Baptise M.D.   On: 12/30/2016 07:40   Ct Chest Wo Contrast  Result Date: 01/12/2017 CLINICAL DATA:  Right lung cancer, recent pneumonia, cough and shortness of breath. EXAM: CT CHEST WITHOUT CONTRAST TECHNIQUE: Multidetector CT imaging of the chest was performed following the standard protocol without IV contrast. COMPARISON:  12/14/2016 and PET 07/07/2016. FINDINGS: Cardiovascular: Right IJ Port-A-Cath terminates in the low SVC. Atherosclerotic calcification of the arterial vasculature, including three-vessel involvement of the coronary arteries. Heart is at the upper limits of normal in size to mildly enlarged. No pericardial effusion. Mediastinum/Nodes: Supraclavicular and mediastinal lymph nodes are not enlarged by CT size criteria. Hilar regions are difficult to evaluate without IV contrast. No axillary adenopathy. Esophagus is grossly unremarkable. Lungs/Pleura: Centrilobular and paraseptal emphysema. Patchy ground-glass is seen in the lungs bilaterally, worst in the right upper lobe, as before. Ground-glass in the left lung has increased slightly, however. Spiculated soft tissue consolidation in the anterior right upper  lobe measures 1.9 x 2.7 cm, likely stable. Resolving collapse/ consolidation in the right lower lobe with residual ground-glass and minimal consolidation. No pleural fluid. Airway is unremarkable. Upper Abdomen: Visualized portion of the liver is unremarkable. 9 mm nodule in the medial limb right adrenal gland measures 20 Hounsfield units, stable in size from 12/14/2016 and fluid in density on 07/07/2016. There may be nodular thickening of the left adrenal gland. Visualized portions of the kidneys, spleen, pancreas, stomach and bowel are grossly unremarkable. Musculoskeletal: Degenerative changes in the spine. IMPRESSION: 1. Posttreatment changes in the upper right hemithorax with residual nodular consolidation anteriorly, stable. 2. Resolving collapse/consolidation in the right lower lobe without complete resolution. 3. Multifocal pulmonary parenchymal ground-glass, some of which is new and likely infectious or inflammatory in etiology. 4. Aortic atherosclerosis (ICD10-170.0). Coronary artery calcification. 5.  Emphysema (ICD10-J43.9). 6. Right adrenal adenoma. Electronically Signed   By: Lorin Picket M.D.   On: 01/12/2017 14:51     CBC  Recent Labs Lab 01/02/2017 0403 01/15/17 0644  WBC 9.5 13.9*  HGB 9.2* 8.5*  HCT 28.4* 25.9*  PLT 417* 393  MCV 89.9 90.2  MCH 29.1 29.6  MCHC 32.4 32.8  RDW 16.1* 16.3*  LYMPHSABS 1.1 0.6*  MONOABS 1.2* 1.3*  EOSABS 0.1 0.0  BASOSABS 0.0 0.0    Chemistries   Recent Labs Lab 01/29/2017 0403 01/15/17 0644  NA 135 141  K 3.3* 5.0  CL 101 110  CO2 25 24  GLUCOSE 117* 147*  BUN 14 19  CREATININE 0.94 0.71  CALCIUM 8.8* 9.1   ------------------------------------------------------------------------------------------------------------------ estimated creatinine clearance is 46.4 mL/min (by C-G formula based on SCr of 0.71 mg/dL). ------------------------------------------------------------------------------------------------------------------ No  results for input(s): HGBA1C in the last 72 hours. ------------------------------------------------------------------------------------------------------------------ No results for input(s): CHOL, HDL, LDLCALC, TRIG, CHOLHDL, LDLDIRECT in the last 72 hours. ------------------------------------------------------------------------------------------------------------------ No results for input(s): TSH, T4TOTAL, T3FREE, THYROIDAB in the last 72 hours.  Invalid input(s): FREET3 ------------------------------------------------------------------------------------------------------------------ No results for input(s): VITAMINB12, FOLATE, FERRITIN, TIBC, IRON, RETICCTPCT in the last 72 hours.  Coagulation profile  Recent Labs Lab 01/19/2017 0403 01/15/17 0644  INR 2.76 4.10*    No results for input(s): DDIMER in the last 72 hours.  Cardiac Enzymes No results for input(s): CKMB, TROPONINI, MYOGLOBIN in the last 168 hours.  Invalid input(s): CK ------------------------------------------------------------------------------------------------------------------ Invalid input(s): POCBNP   CBG: No results for input(s): GLUCAP in the last 168 hours.     Studies: Dg Chest 2 View  Result Date: 01/06/2017 CLINICAL DATA:  Shortness of breath.  Cough and wheezing. EXAM: CHEST  2 VIEW COMPARISON:  CT 2 days prior.  Radiograph 12/29/2016 FINDINGS: Tip of the right  chest port in the mid SVC. Post treatment related changes in the right upper lobe. Consolidative opacity anteriorly on CT not well seen radiographically. Persistent right lung volume loss. Mild cardiomegaly with atherosclerosis of the thoracic aorta. Ill-defined left mid and lower lung opacities core spine a ground-glass opacities on prior CT. No pneumothorax or pleural effusion. Stable osseous structures. IMPRESSION: 1. Similar appearance of the chest from recent CT allowing for differences in modality. Post treatment changes in the right upper  lobe. Left mid lower lung zone opacities corresponding to ground-glass on CT, may be infectious or inflammatory in etiology. 2. Mild cardiomegaly.  Aortic atherosclerosis. Electronically Signed   By: Jeb Levering M.D.   On: 01/11/2017 04:28      No results found for: HGBA1C Lab Results  Component Value Date   LDLCALC 100 (H) 05/21/2016   CREATININE 0.71 01/15/2017       Scheduled Meds: . amiodarone  200 mg Oral Daily  . aspirin EC  81 mg Oral Daily  . atorvastatin  40 mg Oral q1800  . busPIRone  15 mg Oral BID  . DULoxetine  60 mg Oral Daily  . gabapentin  100 mg Oral QHS  . guaiFENesin  600 mg Oral BID  . ipratropium-albuterol  3 mL Nebulization Q6H  . pantoprazole  40 mg Oral Daily  . potassium chloride  20 mEq Oral BID  . predniSONE  40 mg Oral Q breakfast  . Warfarin - Pharmacist Dosing Inpatient   Does not apply q1800   Continuous Infusions: . ceFEPime (MAXIPIME) IV Stopped (01/15/17 0622)     LOS: 0 days    Time spent: >30 MINS    Reyne Dumas  [facility] Pager [phone]. If 7PM-7AM, please contact night-coverage at [email or web address], password [facility] 01/15/2017, 10:03 AM  LOS: 0 days

## 2017-01-15 NOTE — Progress Notes (Signed)
CRITICAL VALUE ALERT  Critical Value:  INR 4.1  Date & Time Notied:  01/15/2017 4451  Provider Notified: Allyson Sabal, MD  Orders Received/Actions taken: awaiting further instructions

## 2017-01-16 ENCOUNTER — Observation Stay (HOSPITAL_COMMUNITY): Payer: Medicare Other

## 2017-01-16 DIAGNOSIS — F320 Major depressive disorder, single episode, mild: Secondary | ICD-10-CM | POA: Diagnosis not present

## 2017-01-16 DIAGNOSIS — K449 Diaphragmatic hernia without obstruction or gangrene: Secondary | ICD-10-CM | POA: Diagnosis not present

## 2017-01-16 DIAGNOSIS — Z923 Personal history of irradiation: Secondary | ICD-10-CM | POA: Diagnosis not present

## 2017-01-16 DIAGNOSIS — I10 Essential (primary) hypertension: Secondary | ICD-10-CM | POA: Diagnosis not present

## 2017-01-16 DIAGNOSIS — Z7982 Long term (current) use of aspirin: Secondary | ICD-10-CM | POA: Diagnosis not present

## 2017-01-16 DIAGNOSIS — E876 Hypokalemia: Secondary | ICD-10-CM | POA: Diagnosis not present

## 2017-01-16 DIAGNOSIS — Z818 Family history of other mental and behavioral disorders: Secondary | ICD-10-CM | POA: Diagnosis not present

## 2017-01-16 DIAGNOSIS — J9801 Acute bronchospasm: Secondary | ICD-10-CM | POA: Diagnosis not present

## 2017-01-16 DIAGNOSIS — D649 Anemia, unspecified: Secondary | ICD-10-CM | POA: Diagnosis not present

## 2017-01-16 DIAGNOSIS — I482 Chronic atrial fibrillation: Secondary | ICD-10-CM | POA: Diagnosis not present

## 2017-01-16 DIAGNOSIS — J441 Chronic obstructive pulmonary disease with (acute) exacerbation: Secondary | ICD-10-CM | POA: Diagnosis present

## 2017-01-16 DIAGNOSIS — Z515 Encounter for palliative care: Secondary | ICD-10-CM | POA: Diagnosis not present

## 2017-01-16 DIAGNOSIS — I4891 Unspecified atrial fibrillation: Secondary | ICD-10-CM | POA: Diagnosis not present

## 2017-01-16 DIAGNOSIS — E785 Hyperlipidemia, unspecified: Secondary | ICD-10-CM | POA: Diagnosis not present

## 2017-01-16 DIAGNOSIS — I480 Paroxysmal atrial fibrillation: Secondary | ICD-10-CM | POA: Diagnosis not present

## 2017-01-16 DIAGNOSIS — Z7189 Other specified counseling: Secondary | ICD-10-CM | POA: Diagnosis not present

## 2017-01-16 DIAGNOSIS — E871 Hypo-osmolality and hyponatremia: Secondary | ICD-10-CM | POA: Diagnosis not present

## 2017-01-16 DIAGNOSIS — M8580 Other specified disorders of bone density and structure, unspecified site: Secondary | ICD-10-CM | POA: Diagnosis present

## 2017-01-16 DIAGNOSIS — J9602 Acute respiratory failure with hypercapnia: Secondary | ICD-10-CM | POA: Diagnosis not present

## 2017-01-16 DIAGNOSIS — I5033 Acute on chronic diastolic (congestive) heart failure: Secondary | ICD-10-CM | POA: Diagnosis not present

## 2017-01-16 DIAGNOSIS — Z7951 Long term (current) use of inhaled steroids: Secondary | ICD-10-CM | POA: Diagnosis not present

## 2017-01-16 DIAGNOSIS — I2510 Atherosclerotic heart disease of native coronary artery without angina pectoris: Secondary | ICD-10-CM | POA: Diagnosis present

## 2017-01-16 DIAGNOSIS — Z8249 Family history of ischemic heart disease and other diseases of the circulatory system: Secondary | ICD-10-CM | POA: Diagnosis not present

## 2017-01-16 DIAGNOSIS — T462X5A Adverse effect of other antidysrhythmic drugs, initial encounter: Secondary | ICD-10-CM | POA: Diagnosis not present

## 2017-01-16 DIAGNOSIS — I481 Persistent atrial fibrillation: Secondary | ICD-10-CM | POA: Diagnosis not present

## 2017-01-16 DIAGNOSIS — J984 Other disorders of lung: Secondary | ICD-10-CM | POA: Diagnosis not present

## 2017-01-16 DIAGNOSIS — R140 Abdominal distension (gaseous): Secondary | ICD-10-CM | POA: Diagnosis not present

## 2017-01-16 DIAGNOSIS — J700 Acute pulmonary manifestations due to radiation: Secondary | ICD-10-CM | POA: Diagnosis not present

## 2017-01-16 DIAGNOSIS — Z811 Family history of alcohol abuse and dependence: Secondary | ICD-10-CM | POA: Diagnosis not present

## 2017-01-16 DIAGNOSIS — R0602 Shortness of breath: Secondary | ICD-10-CM | POA: Diagnosis not present

## 2017-01-16 DIAGNOSIS — Z87891 Personal history of nicotine dependence: Secondary | ICD-10-CM | POA: Diagnosis not present

## 2017-01-16 DIAGNOSIS — Z8261 Family history of arthritis: Secondary | ICD-10-CM | POA: Diagnosis not present

## 2017-01-16 DIAGNOSIS — Z85118 Personal history of other malignant neoplasm of bronchus and lung: Secondary | ICD-10-CM | POA: Diagnosis not present

## 2017-01-16 DIAGNOSIS — F418 Other specified anxiety disorders: Secondary | ICD-10-CM | POA: Diagnosis present

## 2017-01-16 DIAGNOSIS — J9601 Acute respiratory failure with hypoxia: Secondary | ICD-10-CM | POA: Diagnosis not present

## 2017-01-16 DIAGNOSIS — Z7901 Long term (current) use of anticoagulants: Secondary | ICD-10-CM | POA: Diagnosis not present

## 2017-01-16 DIAGNOSIS — R0902 Hypoxemia: Secondary | ICD-10-CM | POA: Diagnosis not present

## 2017-01-16 DIAGNOSIS — I361 Nonrheumatic tricuspid (valve) insufficiency: Secondary | ICD-10-CM | POA: Diagnosis not present

## 2017-01-16 DIAGNOSIS — Z9221 Personal history of antineoplastic chemotherapy: Secondary | ICD-10-CM | POA: Diagnosis not present

## 2017-01-16 DIAGNOSIS — K219 Gastro-esophageal reflux disease without esophagitis: Secondary | ICD-10-CM | POA: Diagnosis present

## 2017-01-16 DIAGNOSIS — I110 Hypertensive heart disease with heart failure: Secondary | ICD-10-CM | POA: Diagnosis present

## 2017-01-16 DIAGNOSIS — J189 Pneumonia, unspecified organism: Secondary | ICD-10-CM | POA: Diagnosis not present

## 2017-01-16 DIAGNOSIS — E875 Hyperkalemia: Secondary | ICD-10-CM | POA: Diagnosis not present

## 2017-01-16 LAB — PROTIME-INR
INR: 4.28
Prothrombin Time: 40.8 seconds — ABNORMAL HIGH (ref 11.4–15.2)

## 2017-01-16 LAB — CBC
HEMATOCRIT: 26.8 % — AB (ref 36.0–46.0)
Hemoglobin: 8.8 g/dL — ABNORMAL LOW (ref 12.0–15.0)
MCH: 29.9 pg (ref 26.0–34.0)
MCHC: 32.8 g/dL (ref 30.0–36.0)
MCV: 91.2 fL (ref 78.0–100.0)
Platelets: 458 10*3/uL — ABNORMAL HIGH (ref 150–400)
RBC: 2.94 MIL/uL — ABNORMAL LOW (ref 3.87–5.11)
RDW: 16.6 % — AB (ref 11.5–15.5)
WBC: 10.9 10*3/uL — AB (ref 4.0–10.5)

## 2017-01-16 MED ORDER — IPRATROPIUM BROMIDE 0.02 % IN SOLN
0.5000 mg | Freq: Four times a day (QID) | RESPIRATORY_TRACT | Status: DC
  Administered 2017-01-17 – 2017-01-23 (×26): 0.5 mg via RESPIRATORY_TRACT
  Filled 2017-01-16 (×27): qty 2.5

## 2017-01-16 MED ORDER — ALBUTEROL SULFATE (2.5 MG/3ML) 0.083% IN NEBU: 2.5000 mg | INHALATION_SOLUTION | RESPIRATORY_TRACT | 12 refills | Status: AC | PRN

## 2017-01-16 MED ORDER — LEVALBUTEROL HCL 1.25 MG/0.5ML IN NEBU
1.2500 mg | INHALATION_SOLUTION | Freq: Four times a day (QID) | RESPIRATORY_TRACT | Status: DC
  Administered 2017-01-17 – 2017-01-23 (×26): 1.25 mg via RESPIRATORY_TRACT
  Filled 2017-01-16 (×26): qty 0.5

## 2017-01-16 MED ORDER — LEVALBUTEROL HCL 1.25 MG/0.5ML IN NEBU
1.2500 mg | INHALATION_SOLUTION | Freq: Four times a day (QID) | RESPIRATORY_TRACT | Status: DC | PRN
  Administered 2017-01-16: 1.25 mg via RESPIRATORY_TRACT
  Filled 2017-01-16: qty 0.5

## 2017-01-16 MED ORDER — ALBUTEROL SULFATE (2.5 MG/3ML) 0.083% IN NEBU
2.5000 mg | INHALATION_SOLUTION | RESPIRATORY_TRACT | Status: DC | PRN
  Administered 2017-01-20: 2.5 mg via RESPIRATORY_TRACT
  Filled 2017-01-16: qty 3

## 2017-01-16 MED ORDER — FUROSEMIDE 10 MG/ML IJ SOLN
40.0000 mg | Freq: Once | INTRAMUSCULAR | Status: AC
  Administered 2017-01-16: 40 mg via INTRAVENOUS
  Filled 2017-01-16: qty 4

## 2017-01-16 MED ORDER — PREDNISONE 20 MG PO TABS: 40.0000 mg | ORAL_TABLET | Freq: Every day | ORAL | 0 refills | Status: AC

## 2017-01-16 MED ORDER — GUAIFENESIN ER 600 MG PO TB12: 600.0000 mg | ORAL_TABLET | Freq: Two times a day (BID) | ORAL | 0 refills | Status: AC

## 2017-01-16 MED ORDER — HYDROCODONE-HOMATROPINE 5-1.5 MG/5ML PO SYRP: 5.0000 mL | ORAL_SOLUTION | Freq: Four times a day (QID) | ORAL | 0 refills | Status: AC | PRN

## 2017-01-16 MED ORDER — CEFPODOXIME PROXETIL 200 MG PO TABS: 200.0000 mg | ORAL_TABLET | Freq: Two times a day (BID) | ORAL | 0 refills | Status: AC

## 2017-01-16 NOTE — Progress Notes (Signed)
ANTICOAGULATION CONSULT NOTE -follow up  Pharmacy Consult for Coumadin Indication: atrial fibrillation  No Known Allergies  Patient Measurements: Height: 5\' 3"  (160 cm) Weight: 135 lb 12.8 oz (61.6 kg) IBW/kg (Calculated) : 52.4   Vital Signs: Temp: 99.1 F (37.3 C) (09/16 0507) Temp Source: Oral (09/16 0507) BP: 122/75 (09/16 0507) Pulse Rate: 102 (09/16 0507)  Labs:  Recent Labs  01/29/2017 0403 01/15/17 0644 01/16/17 0618  HGB 9.2* 8.5* 8.8*  HCT 28.4* 25.9* 26.8*  PLT 417* 393 458*  LABPROT 29.0* 39.4* 40.8*  INR 2.76 4.10* 4.28*  CREATININE 0.94 0.71  --     Estimated Creatinine Clearance: 46.4 mL/min (by C-G formula based on SCr of 0.71 mg/dL).   Medical History: Past Medical History:  Diagnosis Date  . A-fib ([place])   . Alcohol abuse, in remission   . Anxiety   . Atrial fibrillation ([place])   . Depression   . Dyspnea    walking distances   . Dysrhythmia   . History of bronchitis   . HTN (hypertension)   . Mass of right lung 07/13/2016  . Osteopenia   . Squamous cell carcinoma of right lung ([place]) 07/13/2016    Medications:  Prescriptions Prior to Admission  Medication Sig Dispense Refill Last Dose  . acetaminophen (TYLENOL) 325 MG tablet Take 2 tablets (650 mg total) by mouth every 4 (four) hours as needed for mild pain, moderate pain, fever or headache. 100 tablet 1 Past Week at Unknown time  . amiodarone (PACERONE) 200 MG tablet Take 200 mg by mouth daily.    01/13/2017 at Unknown time  . aspirin EC 81 MG tablet Take 81 mg by mouth daily.   01/13/2017 at Unknown time  . atorvastatin (LIPITOR) 40 MG tablet TAKE 1 TABLET (40 MG TOTAL) BY MOUTH DAILY. FOR HYPERLIPIDEMIA 90 tablet 1 01/13/2017 at Unknown time  . busPIRone (BUSPAR) 15 MG tablet Take 1 tablet (15 mg total) by mouth 2 (two) times daily. 180 tablet 2 01/13/2017 at Unknown time  . DULoxetine (CYMBALTA) 60 MG capsule Take 1 capsule (60 mg total) by mouth daily. 30 capsule 2 01/13/2017 at Unknown time   . fish oil-omega-3 fatty acids 1000 MG capsule Take 3 capsules (3 g total) by mouth daily. For hyperlipidemia. (Patient taking differently: Take 1 g by mouth 3 (three) times daily. For hyperlipidemia.) 30 capsule  01/13/2017 at Unknown time  . fluticasone (FLONASE) 50 MCG/ACT nasal spray Place 2 sprays into both nostrils 2 (two) times daily. Use as needed (Patient taking differently: Place 2 sprays into both nostrils 2 (two) times daily as needed for allergies. Use as needed) 16 g 6 unknown  . gabapentin (NEURONTIN) 100 MG capsule Take 1 capsule (100 mg total) by mouth at bedtime. 90 capsule 2 01/13/2017 at Unknown time  . nitroGLYCERIN (NITROSTAT) 0.4 MG SL tablet Place 0.4 mg under the tongue.   unknown  . omeprazole (PRILOSEC) 40 MG capsule Take 1 capsule (40 mg total) by mouth daily. 30 capsule 2 01/13/2017 at Unknown time  . warfarin (COUMADIN) 2.5 MG tablet Take 3 mg by mouth daily.    01/13/2017 at 0730  . [DISCONTINUED] HYDROcodone-homatropine (HYCODAN) 5-1.5 MG/5ML syrup Take 5 mLs by mouth every 6 (six) hours as needed for cough.   01/13/2017 at Unknown time    Assessment: 82 y.o. female  with past medical history of atrial fibrillation,  Anxiety,  Depression, hypertension and lung mass who recently finished chemotherapy. Patient presents with shortness of breath. Pharmacy  asked to dose Coumadin. Patient is followed by anticoag clinic and takes 3mg  on Sunday and Thursday  And 1.5mg  On Monday, Tuesday, Wednesday, Friday and Saturday. Patient's INR remains supratherapeutic today.  Goal of Therapy:  INR 2-3 Monitor platelets by anticoagulation protocol: Yes   Plan:  No coumadin today PT-INR daily Monitor for S/S of bleeding  Isac Sarna, BS [place]ena [place], BCPS Clinical Pharmacist Pager [phone] 01/16/2017,11:18 AM

## 2017-01-16 NOTE — Discharge Summary (Deleted)
Physician Discharge Summary  Alexandria Chen MRN: [id number] DOB/AGE: 1934/06/07 82 y.o.  PCP: [name], FNP   Admit date: 01/04/2017 Discharge date: 01/16/2017  Discharge Diagnoses:    Principal Problem:   Hypoxia Active Problems:   Alexandria Chen   Essential Alexandria Chen, benign   Hyperlipidemia   Alexandria Chen (HCC) [I48.91]   HCAP (healthcare-associated pneumonia)   Hypokalemia    Follow-up recommendations Follow-up with PCP in 3-5 days , including all  additional recommended appointments as below Follow-up CBC, CMP in 3-5 days Patient should have INR checked on 9/18 this was conveyed to the patient in writing      Current Discharge Medication List    START taking these medications   Details  albuterol (PROVENTIL) (2.5 MG/3ML) 0.083% nebulizer solution Take 3 mLs (2.5 mg total) by nebulization every 2 (two) hours as needed for wheezing or shortness of breath. Qty: 75 mL, Refills: 12    cefpodoxime (VANTIN) 200 MG tablet Take 1 tablet (200 mg total) by mouth 2 (two) times daily. Qty: 14 tablet, Refills: 0    guaiFENesin (MUCINEX) 600 MG 12 hr tablet Take 1 tablet (600 mg total) by mouth 2 (two) times daily. Qty: 20 tablet, Refills: 0    predniSONE (DELTASONE) 20 MG tablet Take 2 tablets (40 mg total) by mouth daily with breakfast. Qty: 10 tablet, Refills: 0      CONTINUE these medications which have CHANGED   Details  HYDROcodone-homatropine (HYCODAN) 5-1.5 MG/5ML syrup Take 5 mLs by mouth every 6 (six) hours as needed for cough. Qty: 120 mL, Refills: 0      CONTINUE these medications which have NOT CHANGED   Details  acetaminophen (TYLENOL) 325 MG tablet Take 2 tablets (650 mg total) by mouth every 4 (four) hours as needed for mild pain, moderate pain, fever or headache. Qty: 100 tablet, Refills: 1    amiodarone (PACERONE) 200 MG tablet Take 200 mg by mouth daily.     aspirin EC Alexandria MG tablet Take Alexandria mg by mouth daily.    atorvastatin (LIPITOR)  40 MG tablet TAKE 1 TABLET (40 MG TOTAL) BY MOUTH DAILY. FOR HYPERLIPIDEMIA Qty: 90 tablet, Refills: 1    busPIRone (BUSPAR) 15 MG tablet Take 1 tablet (15 mg total) by mouth 2 (two) times daily. Qty: 180 tablet, Refills: 2    DULoxetine (CYMBALTA) 60 MG capsule Take 1 capsule (60 mg total) by mouth daily. Qty: 30 capsule, Refills: 2    fish oil-omega-3 fatty acids 1000 MG capsule Take 3 capsules (3 g total) by mouth daily. For hyperlipidemia. Qty: 30 capsule    fluticasone (FLONASE) 50 MCG/ACT nasal spray Place 2 sprays into both nostrils 2 (two) times daily. Use as needed Qty: 16 g, Refills: 6   Associated Diagnoses: Nasal sinus congestion    gabapentin (NEURONTIN) 100 MG capsule Take 1 capsule (100 mg total) by mouth at bedtime. Qty: 90 capsule, Refills: 2    nitroGLYCERIN (NITROSTAT) 0.4 MG SL tablet Place 0.4 mg under the tongue.    omeprazole (PRILOSEC) 40 MG capsule Take 1 capsule (40 mg total) by mouth daily. Qty: 30 capsule, Refills: 2    warfarin (COUMADIN) 2.5 MG tablet Take 3 mg by mouth daily.          Discharge Condition: *Stable   Discharge Instructions Get Medicines reviewed and adjusted: Please take all your medications with you for your next visit with your Primary MD  Please request your Primary MD to go over all hospital  tests and procedure/radiological results at the follow up, please ask your Primary MD to get all Hospital records sent to his/her office.  If you experience worsening of your admission symptoms, develop shortness of breath, life threatening emergency, suicidal or homicidal thoughts you must seek medical attention immediately by calling 911 or calling your MD immediately if symptoms less severe.  You must read complete instructions/literature along with all the possible adverse reactions/side effects for all the Medicines you take and that have been prescribed to you. Take any new Medicines after you have completely understood and accpet  all the possible adverse reactions/side effects.   Do not drive when taking Pain medications.   Do not take more than prescribed Pain, Sleep and Alexandria Chen Medications  Special Instructions: If you have smoked or chewed Tobacco in the last 2 yrs please stop smoking, stop any regular Alcohol and or any Recreational drug use.  Wear Seat belts while driving.  Please note  You were cared for by a hospitalist during your hospital stay. Once you are discharged, your primary care physician will handle any further medical issues. Please note that NO REFILLS for any discharge medications will be authorized once you are discharged, as it is imperative that you return to your primary care physician (or establish a relationship with a primary care physician if you do not have one) for your aftercare needs so that they can reassess your need for medications and monitor your lab values.     No Known Allergies    Disposition: 03-Skilled Nursing Facility   Consults:  None    Significant Diagnostic Studies:  Dg Chest 2 View  Result Date: 01/02/2017 CLINICAL DATA:  Shortness of breath.  Cough and wheezing. EXAM: CHEST  2 VIEW COMPARISON:  CT 2 days prior.  Radiograph 12/29/2016 FINDINGS: Tip of the right chest port in the mid SVC. Post treatment related changes in the right upper lobe. Consolidative opacity anteriorly on CT not well seen radiographically. Persistent right lung volume loss. Mild cardiomegaly with atherosclerosis of the thoracic aorta. Ill-defined left mid and lower lung opacities core spine a ground-glass opacities on prior CT. No pneumothorax or pleural effusion. Stable osseous structures. IMPRESSION: 1. Similar appearance of the chest from recent CT allowing for differences in modality. Post treatment changes in the right upper lobe. Left mid lower lung zone opacities corresponding to ground-glass on CT, may be infectious or inflammatory in etiology. 2. Mild cardiomegaly.  Aortic  atherosclerosis. Electronically Signed   By: Jeb Levering M.D.   On: 01/22/2017 04:28   Dg Chest 2 View  Result Date: 12/30/2016 CLINICAL DATA:  Pneumonia, follow-up EXAM: CHEST  2 VIEW COMPARISON:  CT 12/15/2015.  Chest x-ray 12/02/2016. FINDINGS: Right Port-A-Cath remains in place, unchanged. Right upper lobe mass again noted. Previously seen left upper lobe pneumonia has cleared. No effusions or acute airspace opacities. Heart is normal size. IMPRESSION: Interval clearance of the left upper lobe pneumonia. No new confluent airspace opacities. Right upper lobe mass again noted. Electronically Signed   By: Rolm Baptise M.D.   On: 12/30/2016 07:40   Ct Chest Wo Contrast  Result Date: Chen/2018 CLINICAL DATA:  Right lung cancer, recent pneumonia, cough and shortness of breath. EXAM: CT CHEST WITHOUT CONTRAST TECHNIQUE: Multidetector CT imaging of the chest was performed following the standard protocol without IV contrast. COMPARISON:  12/14/2016 and PET 07/07/2016. FINDINGS: Cardiovascular: Right IJ Port-A-Cath terminates in the low SVC. Atherosclerotic calcification of the arterial vasculature, including three-vessel involvement of the  coronary arteries. Heart is at the upper limits of normal in size to mildly enlarged. No pericardial effusion. Mediastinum/Nodes: Supraclavicular and mediastinal lymph nodes are not enlarged by CT size criteria. Hilar regions are difficult to evaluate without IV contrast. No axillary adenopathy. Esophagus is grossly unremarkable. Lungs/Pleura: Centrilobular and paraseptal emphysema. Patchy ground-glass is seen in the lungs bilaterally, worst in the right upper lobe, as before. Ground-glass in the left lung has increased slightly, however. Spiculated soft tissue consolidation in the anterior right upper lobe measures 1.9 x 2.7 cm, likely stable. Resolving collapse/ consolidation in the right lower lobe with residual ground-glass and minimal consolidation. No pleural fluid.  Airway is unremarkable. Upper Abdomen: Visualized portion of the liver is unremarkable. 9 mm nodule in the medial limb right adrenal gland measures 20 Hounsfield units, stable in size from 12/14/2016 and fluid in density on 07/07/2016. There may be nodular thickening of the left adrenal gland. Visualized portions of the kidneys, spleen, pancreas, stomach and bowel are grossly unremarkable. Musculoskeletal: Degenerative changes in the spine. IMPRESSION: 1. Posttreatment changes in the upper right hemithorax with residual nodular consolidation anteriorly, stable. 2. Resolving collapse/consolidation in the right lower lobe without complete resolution. 3. Multifocal pulmonary parenchymal ground-glass, some of which is new and likely infectious or inflammatory in etiology. 4. Aortic atherosclerosis (ICD10-170.0). Coronary artery calcification. 5.  Emphysema (ICD10-J43.9). 6. Right adrenal adenoma. Electronically Signed   By: Lorin Picket M.D.   On: 01/12/2017 14:51        Filed Weights   01/08/2017 0258 01/01/2017 0800 01/15/17 0443  Weight: 57.2 kg (126 lb) 59.6 kg (131 lb 6.3 oz) 61.6 kg (135 lb 12.8 oz)     Microbiology: Recent Results (from the past 240 hour(s))  Culture, blood (routine x 2) Call MD if unable to obtain prior to antibiotics being given     Status: None (Preliminary result)   Collection Time: 01/26/2017  7:28 AM  Result Value Ref Range Status   Specimen Description BLOOD RIGHT ARM  Final   Special Requests   Final    BOTTLES DRAWN AEROBIC AND ANAEROBIC Blood Culture adequate volume   Culture NO GROWTH 2 DAYS  Final   Report Status PENDING  Incomplete  Culture, blood (routine x 2) Call MD if unable to obtain prior to antibiotics being given     Status: None (Preliminary result)   Collection Time: 01/05/2017  7:33 AM  Result Value Ref Range Status   Specimen Description BLOOD RIGHT WRIST  Final   Special Requests   Final    BOTTLES DRAWN AEROBIC AND ANAEROBIC Blood Culture adequate  volume   Culture NO GROWTH 2 DAYS  Final   Report Status PENDING  Incomplete       Blood Culture    Component Value Date/Time   SDES BLOOD RIGHT WRIST 01/02/2017 0733   SPECREQUEST  01/22/2017 0733    BOTTLES DRAWN AEROBIC AND ANAEROBIC Blood Culture adequate volume   CULT NO GROWTH 2 DAYS 01/28/2017 0733   REPTSTATUS PENDING 01/17/2017 0733      Labs: Results for orders placed or performed during the hospital encounter of 01/04/2017 (from the past 48 hour(s))  Basic metabolic panel     Status: Abnormal   Collection Time: 01/15/17  6:44 AM  Result Value Ref Range   Sodium 141 135 - 145 mmol/L   Potassium 5.0 3.5 - 5.1 mmol/L    Comment: DELTA CHECK NOTED NO VISIBLE HEMOLYSIS    Chloride 110 101 - 111 mmol/L  CO2 24 22 - 32 mmol/L   Glucose, Bld 147 (H) 65 - 99 mg/dL   BUN 19 6 - 20 mg/dL   Creatinine, Ser 0.71 0.44 - 1.00 mg/dL   Calcium 9.1 8.9 - 10.3 mg/dL   GFR calc non Af Amer >60 >60 mL/min   GFR calc Af Amer >60 >60 mL/min    Comment: (NOTE) The eGFR has been calculated using the CKD EPI equation. This calculation has not been validated in all clinical situations. eGFR's persistently <60 mL/min signify possible Chronic Kidney Disease.    Anion gap 7 5 - 15  CBC WITH DIFFERENTIAL     Status: Abnormal   Collection Time: 01/15/17  6:44 AM  Result Value Ref Range   WBC 13.9 (H) 4.0 - 10.5 K/uL   RBC 2.87 (L) 3.87 - 5.11 MIL/uL   Hemoglobin 8.5 (L) 12.0 - 15.0 g/dL   HCT 25.9 (L) 36.0 - 46.0 %   MCV 90.2 78.0 - 100.0 fL   MCH 29.6 26.0 - 34.0 pg   MCHC 32.8 30.0 - 36.0 g/dL   RDW 16.3 (H) 11.5 - 15.5 %   Platelets 393 150 - 400 K/uL   Neutrophils Relative % 87 %   Neutro Abs 12.1 (H) 1.7 - 7.7 K/uL   Lymphocytes Relative 4 %   Lymphs Abs 0.6 (L) 0.7 - 4.0 K/uL   Monocytes Relative 9 %   Monocytes Absolute 1.3 (H) 0.1 - 1.0 K/uL   Eosinophils Relative 0 %   Eosinophils Absolute 0.0 0.0 - 0.7 K/uL   Basophils Relative 0 %   Basophils Absolute 0.0 0.0 -  0.1 K/uL  Protime-INR     Status: Abnormal   Collection Time: 01/15/17  6:44 AM  Result Value Ref Range   Prothrombin Time 39.4 (H) 11.4 - 15.2 seconds   INR 4.10 (HH)     Comment: CRITICAL RESULT CALLED TO, READ BACK BY AND VERIFIED WITH: [facility] 01/15/17 '@0832'  BY JTORTORICI   CBC     Status: Abnormal   Collection Time: 01/16/17  6:18 AM  Result Value Ref Range   WBC 10.9 (H) 4.0 - 10.5 K/uL   RBC 2.94 (L) 3.87 - 5.11 MIL/uL   Hemoglobin 8.8 (L) 12.0 - 15.0 g/dL   HCT 26.8 (L) 36.0 - 46.0 %   MCV 91.2 78.0 - 100.0 fL   MCH 29.9 26.0 - 34.0 pg   MCHC 32.8 30.0 - 36.0 g/dL   RDW 16.6 (H) 11.5 - 15.5 %   Platelets 458 (H) 150 - 400 K/uL     Lipid Panel     Component Value Date/Time   CHOL 161 05/21/2016 1449   TRIG 63 05/21/2016 1449   TRIG 75 07/25/2013 1019   HDL 48 05/21/2016 1449   HDL 44 07/25/2013 1019   CHOLHDL 3.4 05/21/2016 1449   LDLCALC 100 (H) 05/21/2016 1449   LDLCALC 100 (H) 07/25/2013 1019     No results found for: HGBA1C   Lab Results  Component Value Date   LDLCALC 100 (H) 05/21/2016   CREATININE 0.71 01/15/2017     HPI :   Alexandria Chen ,Alexandria Chen, Alexandria Chen , Alexandria Chen, Alexandria Chen , Alexandria Chen showed posttreatment changes with residual nodular consolidation, resolving collapse/consolidation of the right lower lobe without complete resolution. Multifocal pulmonary groundglass opacity some of which is new. Patient admitted for Alexandria exacerbation, also placed on broad-spectrum antibiotics, possibly due to residual pneumonia  that needs to be treated  According to Dr. Debby Freiberg surgery patient does not need biopsy, based on CT results.   HOSPITAL COURSE:   Right lower lobe pneumonia Patient started on broad-spectrum antibiotics including vancomycin and cefepime along with IV steroids and Scheduled DuoNeb's. Antibiotic has been changed to [facility] for another 7 days Improved symptomatically Currently requiring at least 2 L of oxygen continuous mucinex, oral prednisone times another 5 days, along with supportive therapy at home including a home nebulizer device and home health wean oxygen as able at home    Leukocytosis-white count still slightly elevated likely secondary to prednisone  Hypokalemia-repleted    Mild dehdyration IVF Due to illness  Anemia-hemoglobin drop from 9.2> 8.5>8.8 Chronic and stable , no signs of active bleeding  Hyperlipidemia Continue statin  Alexandria Chen Stable  Alexandria Chen w/ Alexandria Chen No  suicidal or homicidal ideation Cont buspar, cymbalta  Afib-rate controlled Warfarin  per pharmacy,INR became supra therapeutic likely secondary to antibiotics  Cont amiodarone, patient would need close outpatient INR monitoring INR checked on Tuesday  GERD Cont PPI   Chronic pain Cont neurontin  CAD Continue statin    Discharge Exam:  Blood pressure 122/75, pulse (!) 102, temperature 99.1 F (37.3 C), temperature source Oral, resp. rate 18, height '5\' 3"'  (1.6 m), weight 61.6 kg (135 lb 12.8 oz), SpO2 99 %.  Chest is nontender. Heart has regular rate and rhythm without murmur. Abdomen is soft, flat, nontender without masses or hepatosplenomegaly and peristalsis is normoactive. Extremities have no cyanosis or edema, full range of motion is present. Skin is warm and dry without rash. Neurologic: Mental status is normal, cranial nerves are intact, there are no motor or sensory deficits.    Follow-up Information    [name], FNP. Call.    Specialty:  Family Medicine Why:  Hospital follow-up in 3-5 days, you need INR check on 9/18 Contact information: [place] [id number] [phone]           Signed: Reyne Dumas 01/16/2017, 8:43 AM        Time spent >1 hour

## 2017-01-16 NOTE — Progress Notes (Addendum)
[facility] PROGRESS NOTE  Alexandria Chen OIN:[id number] DOB: [birth year] DOA: 01/22/2017   PCP: [name], FNP     Assessment/Plan: Principal Problem:   Hypoxia Active Problems:   Depression   Essential hypertension, benign   Hyperlipidemia   Atrial fibrillation ([place]) [I48.91]   HCAP (healthcare-associated pneumonia)   Hypokalemia   82 y.o.femalewith past medical history of history of right upper lobe non-small cell cancer which is been treated with radiation and chemotherapy ,atrial fibrillation,anxiety , depression,hypertension , COPD and lung mass who recently finished chemotherapy who presents with shortness of breathin the setting of recent pneumonia which still hasn't cleared up. Patient was recently seen by cardiothoracic surgery and evaluated for recurrent right lower lobe mass versus infiltrate. CT chest on 9/12 showed posttreatment changes with residual nodular consolidation, resolving collapse/consolidation of the right lower lobe without complete resolution. Multifocal pulmonary groundglass opacity some of which is new. Patient admitted for COPD exacerbation, also placed on broad-spectrum antibiotics, possibly due to residual pneumonia that needs to be treated  According to Dr. Debby Freiberg surgery patient does not need biopsy, based on CT results.   HOSPITAL COURSE:   Right lower lobe pneumonia Patient started on broad-spectrum antibiotics including vancomycin and cefepime along with IV steroids ( changed to PO prednisone )and Scheduled Neb's. Antibiotic will be  changed to [facility] for another 7 days at discharge  Worse  Symptomatically today  Patient still on 2 L of oxygen mucinex, oral prednisone times another 5 days,  Patient not ready to go home today because of dyspnea on exertion and dizziness. Requested pulmonary to evaluate supportive therapy at home including a home nebulizer device and home health wean oxygen as able at  home  Acute on chronic diastolic heart failure Recent echo in July EF 60-65% with grade 1 diastolic dysfunction With will give Lasix 40 mg IV 1      Leukocytosis-white count still slightly elevated likely secondary to prednisone  Hypokalemia-repleted   Mild dehdyration IVF Due to illness  Anemia-hemoglobin drop from 9.2>8.5>8.8 Chronic and stable , no signs of active bleeding  Hyperlipidemia Continue statin  Hypertension Stable  Depression w/ anxiety No  suicidal or homicidal ideation Cont buspar, cymbalta  Afib-rate controlled Warfarin per pharmacy,INR became supra therapeutic likely secondary to antibiotics  Cont amiodarone, patient would need close outpatient INR monitoring INR checked on Tuesday Brief burst of SVT on telemetry, likely secondary to albuterol/hypoxia Changed albuterol to Xopenex  GERD Cont PPI   Chronic pain Cont neurontin  CAD Continue statin   DVT prophylaxsis Coumadin  Code Status:  Full code    Family Communication: Discussed in detail with the patient, all imaging results, lab results explained to the patient   Disposition Plan: Not stable for discharge should anticipate discharge tomorrow      Consultants:  Pulmonary  Procedures:  None  Antibiotics: Anti-infectives    Start     Dose/Rate Route Frequency Ordered Stop   01/16/17 0000  cefpodoxime (VANTIN) 200 MG tablet     200 mg Oral 2 times daily 01/16/17 0841 01/23/17 2359   01/15/17 0600  ceFEPIme (MAXIPIME) 1 g in dextrose 5 % 50 mL IVPB     1 g 100 mL/hr over 30 Minutes Intravenous Every 24 hours 01/05/2017 0819     01/21/2017 0530  cefTRIAXone (ROCEPHIN) 1 g in dextrose 5 % 50 mL IVPB  Status:  Discontinued     1 g 100 mL/hr over 30 Minutes Intravenous  Once 01/29/2017 0518 01/20/2017 0526   01/13/2017 0530  ceFEPIme (MAXIPIME) 2 g in dextrose 5 % 50 mL IVPB     2 g 100 mL/hr over 30 Minutes Intravenous  Once 01/26/2017 0526 01/20/2017 0633          HPI/Subjective: Patient attempted to go to the bathroom this morning, felt dizzy lightheaded  Objective: Vitals:   01/15/17 1628 01/15/17 2003 01/15/17 2114 01/16/17 0507  BP:   124/62 122/75  Pulse:   98 (!) 102  Resp:   18 18  Temp:   98.2 F (36.8 C) 99.1 F (37.3 C)  TempSrc:   Oral Oral  SpO2: 95% 96% 97% 99%  Weight:      Height:        Intake/Output Summary (Last 24 hours) at 01/16/17 1038 Last data filed at 01/15/17 1940  Gross per 24 hour  Intake              480 ml  Output              300 ml  Net              180 ml    Exam:  Examination:  General exam: Appears calm and comfortable  Respiratory system: Clear to auscultation. Respiratory effort normal. Cardiovascular system: S1 & S2 heard, RRR. No JVD, murmurs, rubs, gallops or clicks. No pedal edema. Gastrointestinal system: Abdomen is nondistended, soft and nontender. No organomegaly or masses felt. Normal bowel sounds heard. Central nervous system: Alert and oriented. No focal neurological deficits. Extremities: Symmetric 5 x 5 power. Skin: No rashes, lesions or ulcers Psychiatry: Judgement and insight appear normal. Mood & affect appropriate.     Data Reviewed: I have personally reviewed following labs and imaging studies  Micro Results Recent Results (from the past 240 hour(s))  Culture, blood (routine x 2) Call MD if unable to obtain prior to antibiotics being given     Status: None (Preliminary result)   Collection Time: 01/27/2017  7:28 AM  Result Value Ref Range Status   Specimen Description BLOOD RIGHT ARM  Final   Special Requests   Final    BOTTLES DRAWN AEROBIC AND ANAEROBIC Blood Culture adequate volume   Culture NO GROWTH 2 DAYS  Final   Report Status PENDING  Incomplete  Culture, blood (routine x 2) Call MD if unable to obtain prior to antibiotics being given     Status: None (Preliminary result)   Collection Time: 01/30/2017  7:33 AM  Result Value Ref Range Status   Specimen  Description BLOOD RIGHT WRIST  Final   Special Requests   Final    BOTTLES DRAWN AEROBIC AND ANAEROBIC Blood Culture adequate volume   Culture NO GROWTH 2 DAYS  Final   Report Status PENDING  Incomplete    Radiology Reports Dg Chest 2 View  Result Date: 01/13/2017 CLINICAL DATA:  Shortness of breath.  Cough and wheezing. EXAM: CHEST  2 VIEW COMPARISON:  CT 2 days prior.  Radiograph 12/29/2016 FINDINGS: Tip of the right chest port in the mid SVC. Post treatment related changes in the right upper lobe. Consolidative opacity anteriorly on CT not well seen radiographically. Persistent right lung volume loss. Mild cardiomegaly with atherosclerosis of the thoracic aorta. Ill-defined left mid and lower lung opacities core spine a ground-glass opacities on prior CT. No pneumothorax or pleural effusion. Stable osseous structures. IMPRESSION: 1. Similar appearance of the chest from recent CT allowing for differences in modality. Post  treatment changes in the right upper lobe. Left mid lower lung zone opacities corresponding to ground-glass on CT, may be infectious or inflammatory in etiology. 2. Mild cardiomegaly.  Aortic atherosclerosis. Electronically Signed   By: Jeb Levering M.D.   On: 01/15/2017 04:28   Dg Chest 2 View  Result Date: 12/30/2016 CLINICAL DATA:  Pneumonia, follow-up EXAM: CHEST  2 VIEW COMPARISON:  CT 12/15/2015.  Chest x-ray 12/02/2016. FINDINGS: Right Port-A-Cath remains in place, unchanged. Right upper lobe mass again noted. Previously seen left upper lobe pneumonia has cleared. No effusions or acute airspace opacities. Heart is normal size. IMPRESSION: Interval clearance of the left upper lobe pneumonia. No new confluent airspace opacities. Right upper lobe mass again noted. Electronically Signed   By: Rolm Baptise M.D.   On: 12/30/2016 07:40   Ct Chest Wo Contrast  Result Date: 01/12/2017 CLINICAL DATA:  Right lung cancer, recent pneumonia, cough and shortness of breath. EXAM: CT  CHEST WITHOUT CONTRAST TECHNIQUE: Multidetector CT imaging of the chest was performed following the standard protocol without IV contrast. COMPARISON:  12/14/2016 and PET 07/07/2016. FINDINGS: Cardiovascular: Right IJ Port-A-Cath terminates in the low SVC. Atherosclerotic calcification of the arterial vasculature, including three-vessel involvement of the coronary arteries. Heart is at the upper limits of normal in size to mildly enlarged. No pericardial effusion. Mediastinum/Nodes: Supraclavicular and mediastinal lymph nodes are not enlarged by CT size criteria. Hilar regions are difficult to evaluate without IV contrast. No axillary adenopathy. Esophagus is grossly unremarkable. Lungs/Pleura: Centrilobular and paraseptal emphysema. Patchy ground-glass is seen in the lungs bilaterally, worst in the right upper lobe, as before. Ground-glass in the left lung has increased slightly, however. Spiculated soft tissue consolidation in the anterior right upper lobe measures 1.9 x 2.7 cm, likely stable. Resolving collapse/ consolidation in the right lower lobe with residual ground-glass and minimal consolidation. No pleural fluid. Airway is unremarkable. Upper Abdomen: Visualized portion of the liver is unremarkable. 9 mm nodule in the medial limb right adrenal gland measures 20 Hounsfield units, stable in size from 12/14/2016 and fluid in density on 07/07/2016. There may be nodular thickening of the left adrenal gland. Visualized portions of the kidneys, spleen, pancreas, stomach and bowel are grossly unremarkable. Musculoskeletal: Degenerative changes in the spine. IMPRESSION: 1. Posttreatment changes in the upper right hemithorax with residual nodular consolidation anteriorly, stable. 2. Resolving collapse/consolidation in the right lower lobe without complete resolution. 3. Multifocal pulmonary parenchymal ground-glass, some of which is new and likely infectious or inflammatory in etiology. 4. Aortic atherosclerosis  (ICD10-170.0). Coronary artery calcification. 5.  Emphysema (ICD10-J43.9). 6. Right adrenal adenoma. Electronically Signed   By: Lorin Picket M.D.   On: 01/12/2017 14:51     CBC  Recent Labs Lab 01/07/2017 0403 01/15/17 0644 01/16/17 0618  WBC 9.5 13.9* 10.9*  HGB 9.2* 8.5* 8.8*  HCT 28.4* 25.9* 26.8*  PLT 417* 393 458*  MCV 89.9 90.2 91.2  MCH 29.1 29.6 29.9  MCHC 32.4 32.8 32.8  RDW 16.1* 16.3* 16.6*  LYMPHSABS 1.1 0.6*  --   MONOABS 1.2* 1.3*  --   EOSABS 0.1 0.0  --   BASOSABS 0.0 0.0  --     Chemistries   Recent Labs Lab 01/13/2017 0403 01/15/17 0644  NA 135 141  K 3.3* 5.0  CL 101 110  CO2 25 24  GLUCOSE 117* 147*  BUN 14 19  CREATININE 0.94 0.71  CALCIUM 8.8* 9.1   ------------------------------------------------------------------------------------------------------------------ estimated creatinine clearance is 46.4 mL/min (by C-G formula  based on SCr of 0.71 mg/dL). ------------------------------------------------------------------------------------------------------------------ No results for input(s): HGBA1C in the last 72 hours. ------------------------------------------------------------------------------------------------------------------ No results for input(s): CHOL, HDL, LDLCALC, TRIG, CHOLHDL, LDLDIRECT in the last 72 hours. ------------------------------------------------------------------------------------------------------------------ No results for input(s): TSH, T4TOTAL, T3FREE, THYROIDAB in the last 72 hours.  Invalid input(s): FREET3 ------------------------------------------------------------------------------------------------------------------ No results for input(s): VITAMINB12, FOLATE, FERRITIN, TIBC, IRON, RETICCTPCT in the last 72 hours.  Coagulation profile  Recent Labs Lab 01/04/2017 0403 01/15/17 0644 01/16/17 0618  INR 2.76 4.10* PENDING    No results for input(s): DDIMER in the last 72 hours.  Cardiac Enzymes No  results for input(s): CKMB, TROPONINI, MYOGLOBIN in the last 168 hours.  Invalid input(s): CK ------------------------------------------------------------------------------------------------------------------ Invalid input(s): POCBNP   CBG: No results for input(s): GLUCAP in the last 168 hours.     Studies: No results found.    No results found for: HGBA1C Lab Results  Component Value Date   LDLCALC 100 (H) 05/21/2016   CREATININE 0.71 01/15/2017       Scheduled Meds: . amiodarone  200 mg Oral Daily  . aspirin EC  81 mg Oral Daily  . atorvastatin  40 mg Oral q1800  . busPIRone  15 mg Oral BID  . DULoxetine  60 mg Oral Daily  . furosemide  40 mg Intravenous Once  . gabapentin  100 mg Oral QHS  . guaiFENesin  600 mg Oral BID  . ipratropium-albuterol  3 mL Nebulization Q6H  . pantoprazole  40 mg Oral Daily  . potassium chloride  20 mEq Oral BID  . predniSONE  40 mg Oral Q breakfast  . Warfarin - Pharmacist Dosing Inpatient   Does not apply q1800   Continuous Infusions: . ceFEPime (MAXIPIME) IV Stopped (01/16/17 0511)     LOS: 0 days    Time spent: >30 MINS    Reyne Dumas  [facility] Pager [phone]. If 7PM-7AM, please contact night-coverage at [email or web address], password [facility] 01/16/2017, 10:38 AM  LOS: 0 days

## 2017-01-16 NOTE — Consult Note (Signed)
Consult requested ID:[facility],Dr. Abrol Consult requested for: Respiratory failure  HPI: This is an 82 year old who has history of atrial fib hypertension squamous cell carcinoma of the right lung having completed chemotherapy and to have been in the hospital in August with a pulmonary infection. She came back to the hospital on the 14th with increasing shortness of breath. She has been coughing some. She had difficulty walking across the room which is much worse than she normally is. She had sweating and diaphoresis but no fever. Chest x-ray and CT shows what looks like pneumonia plus scar tissue from previous treatment. I personally reviewed the studies. She says she is still short of breath. She feels like she's congested. She's not had any chest pain nausea vomiting diarrhea. She does have some headache. She still feels weak. She is not back to baseline. She has had some tachycardia on telemetry and is known to have atrial fib.  Past Medical History:  Diagnosis Date  . A-fib ([place])   . Alcohol abuse, in remission   . Anxiety   . Atrial fibrillation ([place])   . Depression   . Dyspnea    walking distances   . Dysrhythmia   . History of bronchitis   . HTN (hypertension)   . Mass of right lung 07/13/2016  . Osteopenia   . Squamous cell carcinoma of right lung ([place]) 07/13/2016     Family History  Problem Relation Age of Onset  . Anxiety disorder Mother   . Dementia Mother   . Hypertension Mother   . Alcohol abuse Father   . Arthritis Father   . Alcohol abuse Brother   . ADD / ADHD Neg Hx   . Drug abuse Neg Hx   . Bipolar disorder Neg Hx   . Depression Neg Hx   . OCD Neg Hx   . Paranoid behavior Neg Hx   . Schizophrenia Neg Hx   . Seizures Neg Hx   . Sexual abuse Neg Hx   . Physical abuse Neg Hx    She is not aware of any family history of pulmonary problems  Social History   Social History  . Marital status: Widowed    Spouse name: N/A  . Number of children: 5  .  Years of education: N/A   Occupational History  . Retired    Social History Main Topics  . Smoking status: Former Smoker    Packs/day: 2.00    Years: 60.00    Types: Cigarettes    Start date: 05/03/1956    Quit date: 07/15/2016  . Smokeless tobacco: Never Used  . Alcohol use No     Comment: Patient reports no alcohol use since 05/24/2016.  . Drug use: No  . Sexual activity: No   Other Topics Concern  . None   Social History Narrative  . None     ROS: Except as mentioned 10 point review of systems is negative    Objective: Vital signs in last 24 hours: Temp:  [97.7 F (36.5 C)-99.1 F (37.3 C)] 99.1 F (37.3 C) (09/16 0507) Pulse Rate:  [83-102] 102 (09/16 0507) Resp:  [18-20] 18 (09/16 0507) BP: (122-124)/(61-75) 122/75 (09/16 0507) SpO2:  [75 %-100 %] 99 % (09/16 0507) Weight change:  Last BM Date: 01/12/17  Intake/Output from previous day: 09/15 0701 - 09/16 0700 In: 1753.8 [P.O.:720; I.V.:1033.8] Out: 300 [Urine:300]  PHYSICAL EXAM Constitutional: She is awake and alert and looks mildly short of breath. Eyes: Pupils react EOMI. Ears  nose mouth throat: Her mucous membranes are moist. Throat is clear. Hearing is grossly normal. Cardiovascular: She is in atrial fib with well-controlled response now. She has no edema. Respiratory: Her respiratory effort is increased. She has bilateral rhonchi more on the right than on the left and no wheezing. Gastrointestinal: Her abdomen is soft with no masses. Skin: Warm and dry. Musculoskeletal: Normal strength in her arms and legs. Neurological: No focal abnormalities. Psychiatric: Normal mood and affect  Lab Results: Basic Metabolic Panel:  Recent Labs  01/12/2017 0403 01/15/17 0644  NA 135 141  K 3.3* 5.0  CL 101 110  CO2 25 24  GLUCOSE 117* 147*  BUN 14 19  CREATININE 0.94 0.71  CALCIUM 8.8* 9.1   Liver Function Tests: No results for input(s): AST, ALT, ALKPHOS, BILITOT, PROT, ALBUMIN in the last 72 hours. No  results for input(s): LIPASE, AMYLASE in the last 72 hours. No results for input(s): AMMONIA in the last 72 hours. CBC:  Recent Labs  01/29/2017 0403 01/15/17 0644 01/16/17 0618  WBC 9.5 13.9* 10.9*  NEUTROABS 7.2 12.1*  --   HGB 9.2* 8.5* 8.8*  HCT 28.4* 25.9* 26.8*  MCV 89.9 90.2 91.2  PLT 417* 393 458*   Cardiac Enzymes: No results for input(s): CKTOTAL, CKMB, CKMBINDEX, TROPONINI in the last 72 hours. BNP: No results for input(s): PROBNP in the last 72 hours. D-Dimer: No results for input(s): DDIMER in the last 72 hours. CBG: No results for input(s): GLUCAP in the last 72 hours. Hemoglobin A1C: No results for input(s): HGBA1C in the last 72 hours. Fasting Lipid Panel: No results for input(s): CHOL, HDL, LDLCALC, TRIG, CHOLHDL, LDLDIRECT in the last 72 hours. Thyroid Function Tests: No results for input(s): TSH, T4TOTAL, FREET4, T3FREE, THYROIDAB in the last 72 hours. Anemia Panel: No results for input(s): VITAMINB12, FOLATE, FERRITIN, TIBC, IRON, RETICCTPCT in the last 72 hours. Coagulation:  Recent Labs  01/15/17 0644 01/16/17 0618  LABPROT 39.4* 40.8*  INR 4.10* PENDING   Urine Drug Screen: Drugs of Abuse     Component Value Date/Time   LABOPIA NONE DETECTED 10/14/2011 1133   COCAINSCRNUR NONE DETECTED 10/14/2011 1133   LABBENZ NONE DETECTED 10/14/2011 1133   AMPHETMU NONE DETECTED 10/14/2011 1133   THCU NONE DETECTED 10/14/2011 1133   LABBARB NONE DETECTED 10/14/2011 1133    Alcohol Level: No results for input(s): ETH in the last 72 hours. Urinalysis: No results for input(s): COLORURINE, LABSPEC, PHURINE, GLUCOSEU, HGBUR, BILIRUBINUR, KETONESUR, PROTEINUR, UROBILINOGEN, NITRITE, LEUKOCYTESUR in the last 72 hours.  Invalid input(s): APPERANCEUR Misc. Labs:   ABGS: No results for input(s): PHART, PO2ART, TCO2, HCO3 in the last 72 hours.  Invalid input(s): PCO2   MICROBIOLOGY: Recent Results (from the past 240 hour(s))  Culture, blood (routine x  2) Call MD if unable to obtain prior to antibiotics being given     Status: None (Preliminary result)   Collection Time: 01/08/2017  7:28 AM  Result Value Ref Range Status   Specimen Description BLOOD RIGHT ARM  Final   Special Requests   Final    BOTTLES DRAWN AEROBIC AND ANAEROBIC Blood Culture adequate volume   Culture NO GROWTH 2 DAYS  Final   Report Status PENDING  Incomplete  Culture, blood (routine x 2) Call MD if unable to obtain prior to antibiotics being given     Status: None (Preliminary result)   Collection Time: 01/13/2017  7:33 AM  Result Value Ref Range Status   Specimen Description BLOOD RIGHT WRIST  Final   Special Requests   Final    BOTTLES DRAWN AEROBIC AND ANAEROBIC Blood Culture adequate volume   Culture NO GROWTH 2 DAYS  Final   Report Status PENDING  Incomplete    Studies/Results: No results found.  Medications:  Prior to Admission:  Prescriptions Prior to Admission  Medication Sig Dispense Refill Last Dose  . acetaminophen (TYLENOL) 325 MG tablet Take 2 tablets (650 mg total) by mouth every 4 (four) hours as needed for mild pain, moderate pain, fever or headache. 100 tablet 1 Past Week at Unknown time  . amiodarone (PACERONE) 200 MG tablet Take 200 mg by mouth daily.    01/13/2017 at Unknown time  . aspirin EC 81 MG tablet Take 81 mg by mouth daily.   01/13/2017 at Unknown time  . atorvastatin (LIPITOR) 40 MG tablet TAKE 1 TABLET (40 MG TOTAL) BY MOUTH DAILY. FOR HYPERLIPIDEMIA 90 tablet 1 01/13/2017 at Unknown time  . busPIRone (BUSPAR) 15 MG tablet Take 1 tablet (15 mg total) by mouth 2 (two) times daily. 180 tablet 2 01/13/2017 at Unknown time  . DULoxetine (CYMBALTA) 60 MG capsule Take 1 capsule (60 mg total) by mouth daily. 30 capsule 2 01/13/2017 at Unknown time  . fish oil-omega-3 fatty acids 1000 MG capsule Take 3 capsules (3 g total) by mouth daily. For hyperlipidemia. (Patient taking differently: Take 1 g by mouth 3 (three) times daily. For hyperlipidemia.)  30 capsule  01/13/2017 at Unknown time  . fluticasone (FLONASE) 50 MCG/ACT nasal spray Place 2 sprays into both nostrils 2 (two) times daily. Use as needed (Patient taking differently: Place 2 sprays into both nostrils 2 (two) times daily as needed for allergies. Use as needed) 16 g 6 unknown  . gabapentin (NEURONTIN) 100 MG capsule Take 1 capsule (100 mg total) by mouth at bedtime. 90 capsule 2 01/13/2017 at Unknown time  . nitroGLYCERIN (NITROSTAT) 0.4 MG SL tablet Place 0.4 mg under the tongue.   unknown  . omeprazole (PRILOSEC) 40 MG capsule Take 1 capsule (40 mg total) by mouth daily. 30 capsule 2 01/13/2017 at Unknown time  . warfarin (COUMADIN) 2.5 MG tablet Take 3 mg by mouth daily.    01/13/2017 at 0730  . [DISCONTINUED] HYDROcodone-homatropine (HYCODAN) 5-1.5 MG/5ML syrup Take 5 mLs by mouth every 6 (six) hours as needed for cough.   01/13/2017 at Unknown time   Scheduled: . amiodarone  200 mg Oral Daily  . aspirin EC  81 mg Oral Daily  . atorvastatin  40 mg Oral q1800  . busPIRone  15 mg Oral BID  . DULoxetine  60 mg Oral Daily  . gabapentin  100 mg Oral QHS  . guaiFENesin  600 mg Oral BID  . ipratropium-albuterol  3 mL Nebulization Q6H  . pantoprazole  40 mg Oral Daily  . potassium chloride  20 mEq Oral BID  . predniSONE  40 mg Oral Q breakfast  . Warfarin - Pharmacist Dosing Inpatient   Does not apply q1800   Continuous: . ceFEPime (MAXIPIME) IV Stopped (01/16/17 1540)   GQQ:[id number], hydrALAZINE, nitroGLYCERIN  Assesment:She was admitted with healthcare associated pneumonia and she is on appropriate treatment with IV antibiotics inhale bronchodilators and prednisone. She is coughing mostly nonproductively. She has Mucinex available. She is still hypoxic at baseline she has atrial fibrillation she is on amiodarone. She has hypertension at baseline. She has a history of cancer of the lung and she has finished her treatments. Principal Problem:   Hypoxia Active Problems:  Depression   Essential hypertension, benign   Hyperlipidemia   Atrial fibrillation ([facility]) [I48.91]   HCAP (healthcare-associated pneumonia)   Hypokalemia    Plan: Continue with current IV antibiotics etc. Continue prednisone. Discussed with hospitalist attending and she will plan echocardiogram to see if there are some element of heart failure causing some of the problem. Add flutter valve.  Thanks for allowing me to see her with you    LOS: 0 days   Alexandria Chen 01/16/2017, 10:26 AM

## 2017-01-16 NOTE — Evaluation (Signed)
Physical Therapy Evaluation Patient Details Name: Alexandria Chen MRN: [id number] DOB: 08-23-1934 Today's Date: 01/16/2017   History of Present Illness  Alexandria Chen is a 82 y.o. female  with past medical history of atrial fibrillation anxiety depression hypertension and lung mass who recently finished chemotherapy who presents with shortness of breath. Patient was recently treated for lung infection in Aug. Comes in today due to continued weakness. Slightly decreased by mouth intake. Was admitted inpatient rehabilitation. Daughter alarmed because patient was diaphoretic and had difficulty just ambulating across the room. Rescue squad activated the patient found to be hypoxic. Brought to the emergency room for evaluation.  Clinical Impression  Pt received in chair with dtr at bedside and was agreeable to PT evaluation. Since pt's recent discharge from SNF, she has been staying with one of her dtr's. She is currently receiving [facility] services. Per pt and her dtr, PTA pt was able to ambulate without AD or assistance and was independent with dressing and bathing. Pt currently supervisions for transfers and ambulation with RW due to telemetry and oxygen tank. Pt de-satted to mid-80s with first sit > stand while on 3L O2; her saturation did not improve with prolonged standing so had pt return to seated position and increased pt's O2 to 4L. Pt sats improved to low-to-mid-90s after about 2-3 mins of sitting and with cues for how to perform pursed lip breathing. Pt able to ambulate about 50ft with RW on 4L O2 with supervision before de-satting to 88%; had pt return to chair and after about 2 mins of sitting and pursed lip breathing, pt's sats improved back into mid-90s. Recommend continuation of HHPT once ready for discharge. Pt and dtr agreeable to this plan. Will continue to follow acutely to address deficits in mobility, endurance, and overall function.    Follow Up Recommendations Home health  PT;Supervision/Assistance - 24 hour    Equipment Recommendations  None recommended by PT    Recommendations for Other Services       Precautions / Restrictions Precautions Precautions: Fall Precaution Comments: d/t telemetry and oxygen; pt reports having a "few" falls in the last 6 months; most recent she was in the bathroom and fell trying to put her clothes Restrictions Weight Bearing Restrictions: No      Mobility  Bed Mobility               General bed mobility comments: n/a - pt received and returned to chair  Transfers Overall transfer level: Needs assistance Equipment used: Rolling walker (2 wheeled) Transfers: Sit to/from Stand Sit to Stand: Supervision         General transfer comment: pt se-satted to mid-80s during first sit > stand; had pt sit back down and after a brief sitting break, pt improved to mid-90s  Ambulation/Gait Ambulation/Gait assistance: Supervision Ambulation Distance (Feet): 50 Feet Assistive device: Rolling walker (2 wheeled) Gait Pattern/deviations: Step-through pattern;Decreased stride length Gait velocity: slow and steady Gait velocity interpretation: <1.8 ft/sec, indicative of risk for recurrent falls General Gait Details: min cues to keep feet within RW BOS; pt required 1 standing rest break during amb due to desatting to 88% on 4L O2  Stairs            Wheelchair Mobility    Modified Rankin (Stroke Patients Only)       Balance Overall balance assessment: Needs assistance Sitting-balance support: Feet supported Sitting balance-Leahy Scale: Good     Standing balance support: Bilateral upper extremity supported Standing balance-Leahy Scale:  Fair                               Pertinent Vitals/Pain Pain Assessment: No/denies pain    Home Living Family/patient expects to be discharged to:: Private residence Living Arrangements: Other relatives;Alone (has been staying with dtr recently) Available  Help at Discharge: Family;Available 24 hours/day Type of Home: Apartment Home Access: Level entry;Stairs to enter Entrance Stairs-Rails: Right Entrance Stairs-Number of Steps: 2 Home Layout: One level Home Equipment: Walker - 2 wheels;Cane - single point Additional Comments: for about the last month, pt has been staying with her dtr who lives in a 1 story house, level entry; prior to last hospital admission, pt lived alone in apartment    Prior Function Level of Independence: Independent         Comments: able to ambulate without AD and without assistance PTA; able to dress and bathe self; pt recently d/c from SNF and was receiveing HHPT     Hand Dominance   Dominant Hand: Right    Extremity/Trunk Assessment   Upper Extremity Assessment Upper Extremity Assessment: Overall WFL for tasks assessed    Lower Extremity Assessment Lower Extremity Assessment: Generalized weakness    Cervical / Trunk Assessment Cervical / Trunk Assessment: Normal  Communication   Communication: No difficulties  Cognition Arousal/Alertness: Awake/alert Behavior During Therapy: WFL for tasks assessed/performed Overall Cognitive Status: Within Functional Limits for tasks assessed                                        General Comments      Exercises     Assessment/Plan    PT Assessment Patient needs continued PT services  PT Problem List Decreased strength;Decreased activity tolerance;Cardiopulmonary status limiting activity;Decreased balance       PT Treatment Interventions DME instruction;Gait training;Stair training;Functional mobility training;Therapeutic activities;Therapeutic exercise;Balance training;Patient/family education    PT Goals (Current goals can be found in the Care Plan section)  Acute Rehab PT Goals Patient Stated Goal: to go home PT Goal Formulation: With patient/family Time For Goal Achievement: 01/22/17 Potential to Achieve Goals: Good     Frequency Min 2X/week   Barriers to discharge        Co-evaluation               AM-PAC PT "6 Clicks" Daily Activity  Outcome Measure Difficulty turning over in bed (including adjusting bedclothes, sheets and blankets)?: None Difficulty moving from lying on back to sitting on the side of the bed? : None Difficulty sitting down on and standing up from a chair with arms (e.g., wheelchair, bedside commode, etc,.)?: A Little Help needed moving to and from a bed to chair (including a wheelchair)?: A Little Help needed walking in hospital room?: A Little Help needed climbing 3-5 steps with a railing? : A Lot 6 Click Score: 19    End of Session Equipment Utilized During Treatment: Gait belt;Oxygen Activity Tolerance: Patient tolerated treatment well Patient left: in chair;with call bell/phone within reach;with family/visitor present Nurse Communication: Mobility status PT Visit Diagnosis: Unsteadiness on feet (R26.81);History of falling (Z91.81);Difficulty in walking, not elsewhere classified (R26.2);Other abnormalities of gait and mobility (R26.89);Muscle weakness (generalized) (M62.81)    Time: 4098-1191 PT Time Calculation (min) (ACUTE ONLY): 22 min   Charges:   PT Evaluation $PT Eval Low Complexity: 1 Low  PT G Codes:   PT G-Codes **NOT FOR INPATIENT CLASS** Functional Assessment Tool Used: AM-PAC 6 Clicks Basic Mobility;Clinical judgement Functional Limitation: Mobility: Walking and moving around Mobility: Walking and Moving Around Current Status (Q5848): At least 40 percent but less than 60 percent impaired, limited or restricted Mobility: Walking and Moving Around Goal Status [id number]): At least 20 percent but less than 40 percent impaired, limited or restricted      Geraldine Solar PT, DPT

## 2017-01-16 NOTE — Progress Notes (Signed)
CRITICAL VALUE ALERT  Critical Value:  INR 4.28  Date & Time Notied:  01/16/2017 1043  Provider Notified: Allyson Sabal, MD  Orders Received/Actions taken: Awaiting pharmacist recommendations

## 2017-01-16 NOTE — Progress Notes (Signed)
SATURATION QUALIFICATIONS: (This note is used to comply with regulatory documentation for home oxygen)  Patient Saturations on Room Air at Rest = 84%  Patient Saturations on Room Air while Ambulating = 76%  Patient Saturations on 2 Liters of oxygen while Ambulating = 91%  Please briefly explain why patient needs home oxygen:

## 2017-01-16 NOTE — Progress Notes (Signed)
Patient changed to xopenex 1.25mg  , albuterol is prn

## 2017-01-17 ENCOUNTER — Inpatient Hospital Stay (HOSPITAL_COMMUNITY): Payer: Medicare Other

## 2017-01-17 DIAGNOSIS — I5033 Acute on chronic diastolic (congestive) heart failure: Secondary | ICD-10-CM

## 2017-01-17 DIAGNOSIS — J9601 Acute respiratory failure with hypoxia: Secondary | ICD-10-CM

## 2017-01-17 DIAGNOSIS — I361 Nonrheumatic tricuspid (valve) insufficiency: Secondary | ICD-10-CM

## 2017-01-17 LAB — COMPREHENSIVE METABOLIC PANEL
ALBUMIN: 2.3 g/dL — AB (ref 3.5–5.0)
ALK PHOS: 70 U/L (ref 38–126)
ALT: 36 U/L (ref 14–54)
ANION GAP: 8 (ref 5–15)
AST: 32 U/L (ref 15–41)
BILIRUBIN TOTAL: 0.5 mg/dL (ref 0.3–1.2)
BUN: 20 mg/dL (ref 6–20)
CALCIUM: 9.1 mg/dL (ref 8.9–10.3)
CO2: 28 mmol/L (ref 22–32)
Chloride: 102 mmol/L (ref 101–111)
Creatinine, Ser: 0.86 mg/dL (ref 0.44–1.00)
GFR calc Af Amer: >60 mL/min (ref >60)
GFR calc non Af Amer: >60 mL/min (ref >60)
GLUCOSE: 103 mg/dL — AB (ref 65–99)
Potassium: 4.3 mmol/L (ref 3.5–5.1)
SODIUM: 138 mmol/L (ref 135–145)
TOTAL PROTEIN: 6.7 g/dL (ref 6.5–8.1)

## 2017-01-17 LAB — ECHOCARDIOGRAM COMPLETE
HEIGHTINCHES: 63 in
Weight: 2280 oz

## 2017-01-17 LAB — BRAIN NATRIURETIC PEPTIDE: B Natriuretic Peptide: 84 pg/mL (ref 0.0–100.0)

## 2017-01-17 LAB — PROCALCITONIN: Procalcitonin: 0.22 ng/mL

## 2017-01-17 LAB — PROTIME-INR
INR: 3.14
Prothrombin Time: 32 seconds — ABNORMAL HIGH (ref 11.4–15.2)

## 2017-01-17 MED ORDER — FUROSEMIDE 10 MG/ML IJ SOLN
40.0000 mg | Freq: Once | INTRAMUSCULAR | Status: AC
  Administered 2017-01-17: 40 mg via INTRAVENOUS

## 2017-01-17 MED ORDER — FUROSEMIDE 10 MG/ML IJ SOLN
20.0000 mg | Freq: Two times a day (BID) | INTRAMUSCULAR | Status: DC
  Administered 2017-01-17 – 2017-01-18 (×2): 20 mg via INTRAVENOUS
  Filled 2017-01-17 (×2): qty 2

## 2017-01-17 MED ORDER — VANCOMYCIN HCL IN DEXTROSE 1-5 GM/200ML-% IV SOLN
1000.0000 mg | Freq: Once | INTRAVENOUS | Status: AC
  Administered 2017-01-17: 1000 mg via INTRAVENOUS
  Filled 2017-01-17: qty 200

## 2017-01-17 MED ORDER — FUROSEMIDE 10 MG/ML IJ SOLN
INTRAMUSCULAR | Status: AC
  Administered 2017-01-17: 10:00:00
  Filled 2017-01-17: qty 4

## 2017-01-17 MED ORDER — VANCOMYCIN HCL 500 MG IV SOLR
500.0000 mg | Freq: Two times a day (BID) | INTRAVENOUS | Status: DC
  Administered 2017-01-18 – 2017-01-19 (×4): 500 mg via INTRAVENOUS
  Filled 2017-01-17 (×8): qty 500

## 2017-01-17 NOTE — Progress Notes (Signed)
ANTIBIOTIC CONSULT NOTE-Preliminary  Pharmacy Consult for vancomycin Indication: pneumonia  No Known Allergies  Patient Measurements: Height: 5\' 3"  (160 cm) Weight: 142 lb 8 oz (64.6 kg) IBW/kg (Calculated) : 52.4 Adjusted Body Weight:   Vital Signs: Temp: 98.2 F (36.8 C) (09/17 0525) Temp Source: Oral (09/17 0525) BP: 108/60 (09/17 0525) Pulse Rate: 91 (09/17 0525)  Labs:  Recent Labs  01/15/17 0644 01/16/17 0618  WBC 13.9* 10.9*  HGB 8.5* 8.8*  PLT 393 458*  CREATININE 0.71  --     Estimated Creatinine Clearance: 50.7 mL/min (by C-G formula based on SCr of 0.71 mg/dL).  No results for input(s): VANCOTROUGH, VANCOPEAK, VANCORANDOM, GENTTROUGH, GENTPEAK, GENTRANDOM, TOBRATROUGH, TOBRAPEAK, TOBRARND, AMIKACINPEAK, AMIKACINTROU, AMIKACIN in the last 72 hours.   Microbiology: Recent Results (from the past 720 hour(s))  Culture, blood (routine x 2) Call MD if unable to obtain prior to antibiotics being given     Status: None (Preliminary result)   Collection Time: 01/07/2017  7:28 AM  Result Value Ref Range Status   Specimen Description BLOOD RIGHT ARM  Final   Special Requests   Final    BOTTLES DRAWN AEROBIC AND ANAEROBIC Blood Culture adequate volume   Culture NO GROWTH 2 DAYS  Final   Report Status PENDING  Incomplete  Culture, blood (routine x 2) Call MD if unable to obtain prior to antibiotics being given     Status: None (Preliminary result)   Collection Time: 01/18/2017  7:33 AM  Result Value Ref Range Status   Specimen Description BLOOD RIGHT WRIST  Final   Special Requests   Final    BOTTLES DRAWN AEROBIC AND ANAEROBIC Blood Culture adequate volume   Culture NO GROWTH 2 DAYS  Final   Report Status PENDING  Incomplete    Medical History: Past Medical History:  Diagnosis Date  . A-fib ([place])   . Alcohol abuse, in remission   . Anxiety   . Atrial fibrillation ([place])   . Depression   . Dyspnea    walking distances   . Dysrhythmia   . History of  bronchitis   . HTN (hypertension)   . Mass of right lung 07/13/2016  . Osteopenia   . Squamous cell carcinoma of right lung ([place]) 07/13/2016    Medications:  Scheduled:  . amiodarone  200 mg Oral Daily  . aspirin EC  81 mg Oral Daily  . atorvastatin  40 mg Oral q1800  . busPIRone  15 mg Oral BID  . DULoxetine  60 mg Oral Daily  . gabapentin  100 mg Oral QHS  . guaiFENesin  600 mg Oral BID  . ipratropium  0.5 mg Nebulization Q6H WA  . levalbuterol  1.25 mg Nebulization Q6H WA  . pantoprazole  40 mg Oral Daily  . potassium chloride  20 mEq Oral BID  . predniSONE  40 mg Oral Q breakfast  . Warfarin - Pharmacist Dosing Inpatient   Does not apply q1800   Infusions:  . ceFEPime (MAXIPIME) IV Stopped (01/17/17 1884)  . vancomycin     PRN: albuterol, hydrALAZINE, nitroGLYCERIN Anti-infectives    Start     Dose/Rate Route Frequency Ordered Stop   01/17/17 0700  vancomycin (VANCOCIN) IVPB 1000 mg/200 mL premix     1,000 mg 200 mL/hr over 60 Minutes Intravenous  Once 01/17/17 0650     01/16/17 0000  cefpodoxime (VANTIN) 200 MG tablet     200 mg Oral 2 times daily 01/16/17 0841 01/23/17 2359   01/15/17  0600  ceFEPIme (MAXIPIME) 1 g in dextrose 5 % 50 mL IVPB     1 g 100 mL/hr over 30 Minutes Intravenous Every 24 hours 01/11/2017 0819     01/08/2017 0530  cefTRIAXone (ROCEPHIN) 1 g in dextrose 5 % 50 mL IVPB  Status:  Discontinued     1 g 100 mL/hr over 30 Minutes Intravenous  Once 01/06/2017 0518 01/24/2017 0526   01/11/2017 0530  ceFEPIme (MAXIPIME) 2 g in dextrose 5 % 50 mL IVPB     2 g 100 mL/hr over 30 Minutes Intravenous  Once 01/08/2017 0526 01/25/2017 3825      Assessment: 82 yo female admitted for shortness of breath, being treated for PNA with cefepime.  Now adding vancomycin.   Goal of Therapy:  Vancomycin trough level 15-20 mcg/ml  Plan:  Preliminary review of pertinent patient information completed.  Protocol will be initiated with dose(s) of vancomycin 1000mg  IV x 1.  Forestine Na clinical pharmacist will complete review during morning rounds to assess patient and finalize treatment regimen if needed.  Lyal Husted Scarlett, RPH 01/17/2017,6:51 AM

## 2017-01-17 NOTE — Progress Notes (Signed)
Patient complains of nose congestion , placed on partial rebreathing mask. Patient saturation does decrease if removed from 4 lpm nasal cannula down to 77. Suspect she does have some fluid retention also.

## 2017-01-17 NOTE — Progress Notes (Addendum)
[facility] PROGRESS NOTE  DESHANTA LADY NOM:[id number] DOB: Feb 15, 1935 DOA: 01/13/2017   PCP: [name], FNP     Assessment/Plan: Principal Problem:   Hypoxia Active Problems:   Depression   Essential hypertension, benign   Hyperlipidemia   Atrial fibrillation ([place]) [I48.91]   HCAP (healthcare-associated pneumonia)   Hypokalemia   82 y.o.femalewith past medical history of history of right upper lobe non-small cell cancer which is been treated with radiation and chemotherapy ,atrial fibrillation,anxiety , depression,hypertension , COPD and lung mass who recently finished chemotherapy who presents with shortness of breathin the setting of recent pneumonia which still hasn't cleared up. Patient was recently seen by cardiothoracic surgery and evaluated for recurrent right lower lobe mass versus infiltrate. CT chest on 9/12 showed posttreatment changes with residual nodular consolidation, resolving collapse/consolidation of the right lower lobe without complete resolution. Multifocal pulmonary groundglass opacity some of which is new. Patient admitted for COPD exacerbation, also placed on broad-spectrum antibiotics, possibly due to residual pneumonia that needs to be treated According to Dr. Debby Freiberg surgery patient does not need biopsy, based on CT results.  Over the past few days, patient has improved little. Prior to admission, she was not on any oxygen. Today, she is requiring 4 L nasal cannula satting just at 90% at rest. An attempt by physical therapy to get her to stand to walk over to the chair with assistance, led to drop in oxygen saturations into the mid 70s.  HOSPITAL COURSE:   Right lower lobe pneumonia Patient started on broad-spectrum antibiotics including vancomycin and cefepime along with IV steroids ( changed to PO prednisone )and Scheduled Neb's. Antibiotic will be  changed to [facility] for another 7 days at discharge  Worse   Symptomatically today  Patient now on 4 L of oxygen. Pulmonary following.  Have ordered pro-calcitonin, I suspected this point, it is more heart failure then anything else mucinex, oral prednisone times another 5 days,  Patient not ready to go home today because of dyspnea on exertion and dizziness. Requested pulmonary to evaluate supportive therapy at home including a home nebulizer device and home health wean oxygen as able at home  Acute on chronic diastolic heart failure Recent echo in July EF 60-65% with grade 1 diastolic dysfunction I suspect she is quite a bit volume overloaded, even more so than admission. Weight on admission was 131 pounds and now 142. Placing Foley catheter started gentle Lasix. BNP likely falsely normal given diastolic dysfunction Leukocytosis-white count still slightly elevated likely secondary to prednisone  Hypokalemia-repleted   Mild dehdyration IVF Due to illness  Anemia-hemoglobin drop from 9.2>8.5>8.8 Chronic and stable , no signs of active bleeding  Hyperlipidemia Continue statin  Hypertension Stable  Depression w/ anxiety No  suicidal or homicidal ideation Cont buspar, cymbalta  Paroxysmal Afib-rate controlled Warfarin per pharmacy,INR became supra therapeutic likely secondary to volume overload leading to secondary to hepatic congestion leading to decreased Coumadin clearing. By diuresing, this should greatly help  Cont amiodarone, patient would need close outpatient INR monitoring INR checked on Tuesday Brief burst of SVT on telemetry, likely secondary to albuterol/hypoxia Changed albuterol to Xopenex  GERD Cont PPI   Chronic pain Cont neurontin  CAD Continue statin   DVT prophylaxsis Coumadin  Code Status:  Full code    Family Communication:  multiple family members at the bedside   Disposition Plan: Not stable for discharge should anticipate discharge tomorrow       consultants  Pulmonary  Procedures  Echocardiogram done 9/17: Results pending  Antibiotics: Anti-infectives    Start     Dose/Rate Route Frequency Ordered Stop   01/17/17 1800  vancomycin (VANCOCIN) 500 mg in sodium chloride 0.9 % 100 mL IVPB     500 mg 100 mL/hr over 60 Minutes Intravenous Every 12 hours 01/17/17 0934     01/17/17 0700  vancomycin (VANCOCIN) IVPB 1000 mg/200 mL premix     1,000 mg 200 mL/hr over 60 Minutes Intravenous  Once 01/17/17 0650 01/17/17 0800   01/16/17 0000  cefpodoxime (VANTIN) 200 MG tablet     200 mg Oral 2 times daily 01/16/17 0841 01/23/17 2359   01/15/17 0600  ceFEPIme (MAXIPIME) 1 g in dextrose 5 % 50 mL IVPB     1 g 100 mL/hr over 30 Minutes Intravenous Every 24 hours 01/18/2017 0819     01/07/2017 0530  cefTRIAXone (ROCEPHIN) 1 g in dextrose 5 % 50 mL IVPB  Status:  Discontinued     1 g 100 mL/hr over 30 Minutes Intravenous  Once 01/29/2017 0518 01/13/2017 0526   01/05/2017 0530  ceFEPIme (MAXIPIME) 2 g in dextrose 5 % 50 mL IVPB     2 g 100 mL/hr over 30 Minutes Intravenous  Once 01/03/2017 0526 01/02/2017 9937        Objective: Vitals:   01/17/17 0211 01/17/17 0525 01/17/17 0919 01/17/17 1400  BP:  108/60    Pulse: 76 91    Resp: 16 18    Temp:  98.2 F (36.8 C)    TempSrc:  Oral    SpO2: 97% 96% 100% 95%  Weight:  64.6 kg (142 lb 8 oz)    Height:        Intake/Output Summary (Last 24 hours) at 01/17/17 1434 Last data filed at 01/17/17 0900  Gross per 24 hour  Intake              480 ml  Output                0 ml  Net              480 ml    Exam:  Examination:  General exam:Alert and oriented 3, mildly agitated when she became severely dyspneic and hypoxic HEENT: Normocephalic atraumatic on because remains are slightly dry Neck: Supple, no JVD Cardiovascular: Regular rate and rhythm, S1-S2 Lungs: Decreased breath sounds bibasilar, breathing moderately labored Abdomen: Soft, nontender, nondistended, positive bowel  sounds Extremities: No clubbing or cyanosis or edema Skin: No skin breaks, tears or lesions Psychiatric: Patient is appropriate, no evidence of psychoses Neuro: No focal deficits   Data Reviewed: I have personally reviewed following labs and imaging studies  Micro Results Recent Results (from the past 240 hour(s))  Culture, blood (routine x 2) Call MD if unable to obtain prior to antibiotics being given     Status: None (Preliminary result)   Collection Time: 01/12/2017  7:28 AM  Result Value Ref Range Status   Specimen Description BLOOD RIGHT ARM  Final   Special Requests   Final    BOTTLES DRAWN AEROBIC AND ANAEROBIC Blood Culture adequate volume   Culture NO GROWTH 3 DAYS  Final   Report Status PENDING  Incomplete  Culture, blood (routine x 2) Call MD if unable to obtain prior to antibiotics being given     Status: None (Preliminary result)   Collection Time: 01/16/2017  7:33 AM  Result Value Ref Range Status  Specimen Description BLOOD RIGHT WRIST  Final   Special Requests   Final    BOTTLES DRAWN AEROBIC AND ANAEROBIC Blood Culture adequate volume   Culture NO GROWTH 3 DAYS  Final   Report Status PENDING  Incomplete    Radiology Reports Dg Chest 2 View  Result Date: 01/16/2017 CLINICAL DATA:  Shortness of breath.  History of lung cancer. EXAM: CHEST  2 VIEW COMPARISON:  01/03/2017 FINDINGS: Worsening reticular interstitial accentuation throughout the left lung but slightly favoring the left upper lobe. Continued reticular opacity and hazy density at the right lung apex. Power injectable Port-A-Cath tip: SVC. Mild enlargement of the cardiopericardial silhouette. Atherosclerotic calcification of the aortic arch. Thoracic spondylosis.  No pleural effusion. IMPRESSION: 1. New reticular opacities in both lungs possibly from atypical infectious process or edema. No definite Kerley B lines to suggest typical cardiogenic edema. 2. Some of the interstitial accentuation in the right upper lobe  is thought to be related to prior radiation therapy. 3.  Aortic Atherosclerosis (ICD10-I70.0). 4. Thoracic spondylosis. Electronically Signed   By: Van Clines M.D.   On: 01/16/2017 13:09   Dg Chest 2 View  Result Date: 01/30/2017 CLINICAL DATA:  Shortness of breath.  Cough and wheezing. EXAM: CHEST  2 VIEW COMPARISON:  CT 2 days prior.  Radiograph 12/29/2016 FINDINGS: Tip of the right chest port in the mid SVC. Post treatment related changes in the right upper lobe. Consolidative opacity anteriorly on CT not well seen radiographically. Persistent right lung volume loss. Mild cardiomegaly with atherosclerosis of the thoracic aorta. Ill-defined left mid and lower lung opacities core spine a ground-glass opacities on prior CT. No pneumothorax or pleural effusion. Stable osseous structures. IMPRESSION: 1. Similar appearance of the chest from recent CT allowing for differences in modality. Post treatment changes in the right upper lobe. Left mid lower lung zone opacities corresponding to ground-glass on CT, may be infectious or inflammatory in etiology. 2. Mild cardiomegaly.  Aortic atherosclerosis. Electronically Signed   By: Jeb Levering M.D.   On: 01/24/2017 04:28   Dg Chest 2 View  Result Date: 12/30/2016 CLINICAL DATA:  Pneumonia, follow-up EXAM: CHEST  2 VIEW COMPARISON:  CT 12/15/2015.  Chest x-ray 12/02/2016. FINDINGS: Right Port-A-Cath remains in place, unchanged. Right upper lobe mass again noted. Previously seen left upper lobe pneumonia has cleared. No effusions or acute airspace opacities. Heart is normal size. IMPRESSION: Interval clearance of the left upper lobe pneumonia. No new confluent airspace opacities. Right upper lobe mass again noted. Electronically Signed   By: Rolm Baptise M.D.   On: 12/30/2016 07:40   Ct Chest Wo Contrast  Result Date: 01/12/2017 CLINICAL DATA:  Right lung cancer, recent pneumonia, cough and shortness of breath. EXAM: CT CHEST WITHOUT CONTRAST TECHNIQUE:  Multidetector CT imaging of the chest was performed following the standard protocol without IV contrast. COMPARISON:  12/14/2016 and PET 07/07/2016. FINDINGS: Cardiovascular: Right IJ Port-A-Cath terminates in the low SVC. Atherosclerotic calcification of the arterial vasculature, including three-vessel involvement of the coronary arteries. Heart is at the upper limits of normal in size to mildly enlarged. No pericardial effusion. Mediastinum/Nodes: Supraclavicular and mediastinal lymph nodes are not enlarged by CT size criteria. Hilar regions are difficult to evaluate without IV contrast. No axillary adenopathy. Esophagus is grossly unremarkable. Lungs/Pleura: Centrilobular and paraseptal emphysema. Patchy ground-glass is seen in the lungs bilaterally, worst in the right upper lobe, as before. Ground-glass in the left lung has increased slightly, however. Spiculated soft tissue consolidation in the  anterior right upper lobe measures 1.9 x 2.7 cm, likely stable. Resolving collapse/ consolidation in the right lower lobe with residual ground-glass and minimal consolidation. No pleural fluid. Airway is unremarkable. Upper Abdomen: Visualized portion of the liver is unremarkable. 9 mm nodule in the medial limb right adrenal gland measures 20 Hounsfield units, stable in size from 12/14/2016 and fluid in density on 07/07/2016. There may be nodular thickening of the left adrenal gland. Visualized portions of the kidneys, spleen, pancreas, stomach and bowel are grossly unremarkable. Musculoskeletal: Degenerative changes in the spine. IMPRESSION: 1. Posttreatment changes in the upper right hemithorax with residual nodular consolidation anteriorly, stable. 2. Resolving collapse/consolidation in the right lower lobe without complete resolution. 3. Multifocal pulmonary parenchymal ground-glass, some of which is new and likely infectious or inflammatory in etiology. 4. Aortic atherosclerosis (ICD10-170.0). Coronary artery  calcification. 5.  Emphysema (ICD10-J43.9). 6. Right adrenal adenoma. Electronically Signed   By: Lorin Picket M.D.   On: 01/12/2017 14:51     CBC  Recent Labs Lab 01/13/2017 0403 01/15/17 0644 01/16/17 0618  WBC 9.5 13.9* 10.9*  HGB 9.2* 8.5* 8.8*  HCT 28.4* 25.9* 26.8*  PLT 417* 393 458*  MCV 89.9 90.2 91.2  MCH 29.1 29.6 29.9  MCHC 32.4 32.8 32.8  RDW 16.1* 16.3* 16.6*  LYMPHSABS 1.1 0.6*  --   MONOABS 1.2* 1.3*  --   EOSABS 0.1 0.0  --   BASOSABS 0.0 0.0  --     Chemistries   Recent Labs Lab 01/22/2017 0403 01/15/17 0644 01/17/17 0624  NA 135 141 138  K 3.3* 5.0 4.3  CL 101 110 102  CO2 25 24 28   GLUCOSE 117* 147* 103*  BUN 14 19 20   CREATININE 0.94 0.71 0.86  CALCIUM 8.8* 9.1 9.1  AST  --   --  32  ALT  --   --  36  ALKPHOS  --   --  70  BILITOT  --   --  0.5   ------------------------------------------------------------------------------------------------------------------ estimated creatinine clearance is 47.2 mL/min (by C-G formula based on SCr of 0.86 mg/dL). ------------------------------------------------------------------------------------------------------------------ No results for input(s): HGBA1C in the last 72 hours. ------------------------------------------------------------------------------------------------------------------ No results for input(s): CHOL, HDL, LDLCALC, TRIG, CHOLHDL, LDLDIRECT in the last 72 hours. ------------------------------------------------------------------------------------------------------------------ No results for input(s): TSH, T4TOTAL, T3FREE, THYROIDAB in the last 72 hours.  Invalid input(s): FREET3 ------------------------------------------------------------------------------------------------------------------ No results for input(s): VITAMINB12, FOLATE, FERRITIN, TIBC, IRON, RETICCTPCT in the last 72 hours.  Coagulation profile  Recent Labs Lab 01/25/2017 0403 01/15/17 0644 01/16/17 0618  01/17/17 0624  INR 2.76 4.10* 4.28* 3.14    No results for input(s): DDIMER in the last 72 hours.  Cardiac Enzymes No results for input(s): CKMB, TROPONINI, MYOGLOBIN in the last 168 hours.  Invalid input(s): CK ------------------------------------------------------------------------------------------------------------------ Invalid input(s): POCBNP   CBG: No results for input(s): GLUCAP in the last 168 hours.     Studies: Dg Chest 2 View  Result Date: 01/16/2017 CLINICAL DATA:  Shortness of breath.  History of lung cancer. EXAM: CHEST  2 VIEW COMPARISON:  01/09/2017 FINDINGS: Worsening reticular interstitial accentuation throughout the left lung but slightly favoring the left upper lobe. Continued reticular opacity and hazy density at the right lung apex. Power injectable Port-A-Cath tip: SVC. Mild enlargement of the cardiopericardial silhouette. Atherosclerotic calcification of the aortic arch. Thoracic spondylosis.  No pleural effusion. IMPRESSION: 1. New reticular opacities in both lungs possibly from atypical infectious process or edema. No definite Kerley B lines to suggest typical cardiogenic edema. 2. Some  of the interstitial accentuation in the right upper lobe is thought to be related to prior radiation therapy. 3.  Aortic Atherosclerosis (ICD10-I70.0). 4. Thoracic spondylosis. Electronically Signed   By: Van Clines M.D.   On: 01/16/2017 13:09      No results found for: HGBA1C Lab Results  Component Value Date   LDLCALC 100 (H) 05/21/2016   CREATININE 0.86 01/17/2017       Scheduled Meds: . amiodarone  200 mg Oral Daily  . aspirin EC  81 mg Oral Daily  . atorvastatin  40 mg Oral q1800  . busPIRone  15 mg Oral BID  . DULoxetine  60 mg Oral Daily  . furosemide  20 mg Intravenous BID  . gabapentin  100 mg Oral QHS  . guaiFENesin  600 mg Oral BID  . ipratropium  0.5 mg Nebulization Q6H WA  . levalbuterol  1.25 mg Nebulization Q6H WA  . pantoprazole  40  mg Oral Daily  . potassium chloride  20 mEq Oral BID  . predniSONE  40 mg Oral Q breakfast  . Warfarin - Pharmacist Dosing Inpatient   Does not apply q1800   Continuous Infusions: . ceFEPime (MAXIPIME) IV Stopped (01/17/17 6147)  . vancomycin       LOS: 1 day    Time spent: >30 MINS    [facility] Pager [phone]. If 7PM-7AM, please contact night-coverage at [email or web address], password [facility] 01/17/2017, 2:34 PM  LOS: 1 day

## 2017-01-17 NOTE — Progress Notes (Signed)
Physical Therapy Treatment Patient Details Name: Alexandria Chen MRN: [id number] DOB: 07-20-1934 Today's Date: 01/17/2017    History of Present Illness Alexandria Chen is a 82 y.o. female  with past medical history of atrial fibrillation anxiety depression hypertension and lung mass who recently finished chemotherapy who presents with shortness of breath. Patient was recently treated for lung infection in Aug. Comes in today due to continued weakness. Slightly decreased by mouth intake. Was admitted inpatient rehabilitation. Daughter alarmed because patient was diaphoretic and had difficulty just ambulating across the room. Rescue squad activated the patient found to be hypoxic. Brought to the emergency room for evaluation.    PT Comments    Pt is demonstrating some significant fatigue after two walks in a short time frame, and was attended by hospitalist during and after the decline in O2 from this effort.  MD increased her to 5L O2 and she was only able to walk 25' with RW vs the 60' yesterday, due to the effort of the two walks.  Will continue on acutely and progress gait as her condition allows, and will reconsider HHPT to SNF if she continues to struggle with this effort to use RW without overtaxing her system.   Follow Up Recommendations  Home health PT;Supervision/Assistance - 24 hour;Supervision for mobility/OOB     Equipment Recommendations  None recommended by PT    Recommendations for Other Services       Precautions / Restrictions Precautions Precautions: Fall Precaution Comments: d/t telemetry and oxygen; pt reports having a "few" falls in the last 6 months; most recent she was in the bathroom and fell trying to put her clothes Restrictions Weight Bearing Restrictions: No Other Position/Activity Restrictions: monitor O2 sats    Mobility  Bed Mobility Overal bed mobility: Needs Assistance Bed Mobility: Supine to Sit     Supine to sit: Min assist     General bed mobility  comments: was able to assist her trunk and then pt sat side of bed pivoting well  Transfers Overall transfer level: Needs assistance Equipment used: Rolling walker (2 wheeled) Transfers: Sit to/from Stand Sit to Stand: Min guard         General transfer comment: sat was 95% side of bed  Ambulation/Gait Ambulation/Gait assistance: Min guard;Min assist;Mod assist Ambulation Distance (Feet): 25 Feet Assistive device: Rolling walker (2 wheeled);1 person hand held assist;2 person hand held assist Gait Pattern/deviations: Step-to pattern;Decreased stride length;Shuffle;Trunk flexed;Wide base of support (changed from upright standign to sinking as she dropped O2) Gait velocity: slow and steady Gait velocity interpretation: Below normal speed for age/gender General Gait Details: pt was strugging to turn the walker back from door, assisted her to sit on chair with pt sliding down the chair and not speaking for a moment, witnessed by MD   Stairs            Wheelchair Mobility    Modified Rankin (Stroke Patients Only)       Balance Overall balance assessment: Needs assistance Sitting-balance support: Bilateral upper extremity supported;Feet supported Sitting balance-Leahy Scale: Fair     Standing balance support: Bilateral upper extremity supported;During functional activity Standing balance-Leahy Scale: Fair Standing balance comment: pt became very fatigued suddenly and her O2 sats were ck'ed on telmetry, indicated reduced perfusion and low value to 71% at lowest                            Cognition Arousal/Alertness: Awake/alert Behavior During  Therapy: WFL for tasks assessed/performed Overall Cognitive Status: Within Functional Limits for tasks assessed                                        Exercises      General Comments General comments (skin integrity, edema, etc.): pt was on 4L O2 and dropped from 95% to 71% at the lowest on that  value.  MD was in for entire session and increased O2 to 5L to increase recovery.        Pertinent Vitals/Pain Pain Assessment: No/denies pain    Home Living                      Prior Function            PT Goals (current goals can now be found in the care plan section) Acute Rehab PT Goals Patient Stated Goal: to go home Progress towards PT goals: Not progressing toward goals - comment (pt was struggling due to Fatigue from overdoing)    Frequency    Min 2X/week      PT Plan Current plan remains appropriate    Co-evaluation              AM-PAC PT "6 Clicks" Daily Activity  Outcome Measure  Difficulty turning over in bed (including adjusting bedclothes, sheets and blankets)?: A Lot Difficulty moving from lying on back to sitting on the side of the bed? : Unable Difficulty sitting down on and standing up from a chair with arms (e.g., wheelchair, bedside commode, etc,.)?: Unable Help needed moving to and from a bed to chair (including a wheelchair)?: A Lot Help needed walking in hospital room?: A Lot Help needed climbing 3-5 steps with a railing? : Total 6 Click Score: 9    End of Session Equipment Utilized During Treatment: Gait belt;Oxygen Activity Tolerance: Patient limited by fatigue;Treatment limited secondary to medical complications (Comment) (low O2 sat from walking twice in close time frame) Patient left: in chair;with call bell/phone within reach;with family/visitor present;with nursing/sitter in room Nurse Communication: Mobility status PT Visit Diagnosis: Unsteadiness on feet (R26.81);History of falling (Z91.81);Difficulty in walking, not elsewhere classified (R26.2);Other abnormalities of gait and mobility (R26.89);Muscle weakness (generalized) (M62.81)     Time: 8937-3428 PT Time Calculation (min) (ACUTE ONLY): 38 min  Charges:                       G Codes:  Functional Assessment Tool Used: AM-PAC 6 Clicks Basic Mobility;Clinical  judgement     Ramond Dial 01/17/2017, 1:14 PM   1:17 PM, 01/17/17 Mee Hives, PT, MS Physical Therapist - [place] [phone] (Office)

## 2017-01-17 NOTE — Progress Notes (Signed)
Subjective: She says she feels okay. She is still short of breath. No other new complaints. Discussed with her daughter at bedside and her daughter says that she had improved significantly after she had been hospitalized and then gone to the skilled care facility but then developed gradual increasing shortness of breath over several days.  Objective: Vital signs in last 24 hours: Temp:  [98.2 F (36.8 C)-98.4 F (36.9 C)] 98.2 F (36.8 C) (09/17 0525) Pulse Rate:  [72-98] 91 (09/17 0525) Resp:  [16-22] 18 (09/17 0525) BP: (106-132)/(50-70) 108/60 (09/17 0525) SpO2:  [88 %-97 %] 96 % (09/17 0525) Weight:  [64.6 kg (142 lb 8 oz)] 64.6 kg (142 lb 8 oz) (09/17 0525) Weight change:  Last BM Date: 01/12/17  Intake/Output from previous day: 09/16 0701 - 09/17 0700 In: 480 [P.O.:480] Out: -   PHYSICAL EXAM General appearance: alert, cooperative and mild distress Resp: rhonchi bilaterally Cardio: irregularly irregular rhythm GI: soft, non-tender; bowel sounds normal; no masses,  no organomegaly Extremities: extremities normal, atraumatic, no cyanosis or edema Skin warm and dry  Lab Results:  Results for orders placed or performed during the hospital encounter of 01/07/2017 (from the past 48 hour(s))  Protime-INR     Status: Abnormal   Collection Time: 01/16/17  6:18 AM  Result Value Ref Range   Prothrombin Time 40.8 (H) 11.4 - 15.2 seconds   INR 4.28 (HH)     Comment: CRITICAL RESULT CALLED TO, READ BACK BY AND VERIFIED WITH: DISHMAN,MORGAN 01/16/17'@1044'  BY JTORTORICI   CBC     Status: Abnormal   Collection Time: 01/16/17  6:18 AM  Result Value Ref Range   WBC 10.9 (H) 4.0 - 10.5 K/uL   RBC 2.94 (L) 3.87 - 5.11 MIL/uL   Hemoglobin 8.8 (L) 12.0 - 15.0 g/dL   HCT 26.8 (L) 36.0 - 46.0 %   MCV 91.2 78.0 - 100.0 fL   MCH 29.9 26.0 - 34.0 pg   MCHC 32.8 30.0 - 36.0 g/dL   RDW 16.6 (H) 11.5 - 15.5 %   Platelets 458 (H) 150 - 400 K/uL  Protime-INR     Status: Abnormal   Collection  Time: 01/17/17  6:24 AM  Result Value Ref Range   Prothrombin Time 32.0 (H) 11.4 - 15.2 seconds   INR 3.14   Comprehensive metabolic panel     Status: Abnormal   Collection Time: 01/17/17  6:24 AM  Result Value Ref Range   Sodium 138 135 - 145 mmol/L   Potassium 4.3 3.5 - 5.1 mmol/L   Chloride 102 101 - 111 mmol/L   CO2 28 22 - 32 mmol/L   Glucose, Bld 103 (H) 65 - 99 mg/dL   BUN 20 6 - 20 mg/dL   Creatinine, Ser 0.86 0.44 - 1.00 mg/dL   Calcium 9.1 8.9 - 10.3 mg/dL   Total Protein 6.7 6.5 - 8.1 g/dL   Albumin 2.3 (L) 3.5 - 5.0 g/dL   AST 32 15 - 41 U/L   ALT 36 14 - 54 U/L   Alkaline Phosphatase 70 38 - 126 U/L   Total Bilirubin 0.5 0.3 - 1.2 mg/dL   GFR calc non Af Amer >60 >60 mL/min   GFR calc Af Amer >60 >60 mL/min    Comment: (NOTE) The eGFR has been calculated using the CKD EPI equation. This calculation has not been validated in all clinical situations. eGFR's persistently <60 mL/min signify possible Chronic Kidney Disease.    Anion gap 8 5 -  15    ABGS No results for input(s): PHART, PO2ART, TCO2, HCO3 in the last 72 hours.  Invalid input(s): PCO2 CULTURES Recent Results (from the past 240 hour(s))  Culture, blood (routine x 2) Call MD if unable to obtain prior to antibiotics being given     Status: None (Preliminary result)   Collection Time: 01/12/2017  7:28 AM  Result Value Ref Range Status   Specimen Description BLOOD RIGHT ARM  Final   Special Requests   Final    BOTTLES DRAWN AEROBIC AND ANAEROBIC Blood Culture adequate volume   Culture NO GROWTH 2 DAYS  Final   Report Status PENDING  Incomplete  Culture, blood (routine x 2) Call MD if unable to obtain prior to antibiotics being given     Status: None (Preliminary result)   Collection Time: 01/04/2017  7:33 AM  Result Value Ref Range Status   Specimen Description BLOOD RIGHT WRIST  Final   Special Requests   Final    BOTTLES DRAWN AEROBIC AND ANAEROBIC Blood Culture adequate volume   Culture NO GROWTH 2  DAYS  Final   Report Status PENDING  Incomplete   Studies/Results: Dg Chest 2 View  Result Date: 01/16/2017 CLINICAL DATA:  Shortness of breath.  History of lung cancer. EXAM: CHEST  2 VIEW COMPARISON:  01/18/2017 FINDINGS: Worsening reticular interstitial accentuation throughout the left lung but slightly favoring the left upper lobe. Continued reticular opacity and hazy density at the right lung apex. Power injectable Port-A-Cath tip: SVC. Mild enlargement of the cardiopericardial silhouette. Atherosclerotic calcification of the aortic arch. Thoracic spondylosis.  No pleural effusion. IMPRESSION: 1. New reticular opacities in both lungs possibly from atypical infectious process or edema. No definite Kerley B lines to suggest typical cardiogenic edema. 2. Some of the interstitial accentuation in the right upper lobe is thought to be related to prior radiation therapy. 3.  Aortic Atherosclerosis (ICD10-I70.0). 4. Thoracic spondylosis. Electronically Signed   By: Van Clines M.D.   On: 01/16/2017 13:09    Medications:  Prior to Admission:  Prescriptions Prior to Admission  Medication Sig Dispense Refill Last Dose  . acetaminophen (TYLENOL) 325 MG tablet Take 2 tablets (650 mg total) by mouth every 4 (four) hours as needed for mild pain, moderate pain, fever or headache. 100 tablet 1 Past Week at Unknown time  . amiodarone (PACERONE) 200 MG tablet Take 200 mg by mouth daily.    01/13/2017 at Unknown time  . aspirin EC 81 MG tablet Take 81 mg by mouth daily.   01/13/2017 at Unknown time  . atorvastatin (LIPITOR) 40 MG tablet TAKE 1 TABLET (40 MG TOTAL) BY MOUTH DAILY. FOR HYPERLIPIDEMIA 90 tablet 1 01/13/2017 at Unknown time  . busPIRone (BUSPAR) 15 MG tablet Take 1 tablet (15 mg total) by mouth 2 (two) times daily. 180 tablet 2 01/13/2017 at Unknown time  . DULoxetine (CYMBALTA) 60 MG capsule Take 1 capsule (60 mg total) by mouth daily. 30 capsule 2 01/13/2017 at Unknown time  . fish oil-omega-3  fatty acids 1000 MG capsule Take 3 capsules (3 g total) by mouth daily. For hyperlipidemia. (Patient taking differently: Take 1 g by mouth 3 (three) times daily. For hyperlipidemia.) 30 capsule  01/13/2017 at Unknown time  . fluticasone (FLONASE) 50 MCG/ACT nasal spray Place 2 sprays into both nostrils 2 (two) times daily. Use as needed (Patient taking differently: Place 2 sprays into both nostrils 2 (two) times daily as needed for allergies. Use as needed) 16 g 6  unknown  . gabapentin (NEURONTIN) 100 MG capsule Take 1 capsule (100 mg total) by mouth at bedtime. 90 capsule 2 01/13/2017 at Unknown time  . nitroGLYCERIN (NITROSTAT) 0.4 MG SL tablet Place 0.4 mg under the tongue.   unknown  . omeprazole (PRILOSEC) 40 MG capsule Take 1 capsule (40 mg total) by mouth daily. 30 capsule 2 01/13/2017 at Unknown time  . warfarin (COUMADIN) 2.5 MG tablet Take 3 mg by mouth daily.    01/13/2017 at 0730  . [DISCONTINUED] HYDROcodone-homatropine (HYCODAN) 5-1.5 MG/5ML syrup Take 5 mLs by mouth every 6 (six) hours as needed for cough.   01/13/2017 at Unknown time   Scheduled: . amiodarone  200 mg Oral Daily  . aspirin EC  81 mg Oral Daily  . atorvastatin  40 mg Oral q1800  . busPIRone  15 mg Oral BID  . DULoxetine  60 mg Oral Daily  . furosemide  40 mg Intravenous Once  . gabapentin  100 mg Oral QHS  . guaiFENesin  600 mg Oral BID  . ipratropium  0.5 mg Nebulization Q6H WA  . levalbuterol  1.25 mg Nebulization Q6H WA  . pantoprazole  40 mg Oral Daily  . potassium chloride  20 mEq Oral BID  . predniSONE  40 mg Oral Q breakfast  . Warfarin - Pharmacist Dosing Inpatient   Does not apply q1800   Continuous: . ceFEPime (MAXIPIME) IV Stopped (01/17/17 5009)  . vancomycin     FGH:WEXHBZJIR, hydrALAZINE, nitroGLYCERIN  Assesment:She was admitted with healthcare associated pneumonia. She had atrial fibrillation rapid ventricular response at home. She is improving but slowly. She has some element of COPD from  long-term smoking and she has had chemotherapy and has had some effects from that. Discussed with hospitalist attending yesterday and we both have some concern that there may be an element of heart failure so I have taken the liberty of ordering an echocardiogram Principal Problem:   Hypoxia Active Problems:   Depression   Essential hypertension, benign   Hyperlipidemia   Atrial fibrillation ([place]) [I48.91]   HCAP (healthcare-associated pneumonia)   Hypokalemia    Plan: Continue other treatments. Echocardiogram today. I gone ahead and given her another dose of Lasix today she does sound somewhat clearer than yesterday so the Lasix yesterday may have helped    LOS: 1 day   Christerpher Clos L 01/17/2017, 8:46 AM

## 2017-01-17 NOTE — Progress Notes (Signed)
  Echocardiogram 2D Echocardiogram has been performed.  Darlina Sicilian M 01/17/2017, 2:05 PM

## 2017-01-18 DIAGNOSIS — Z7901 Long term (current) use of anticoagulants: Secondary | ICD-10-CM

## 2017-01-18 DIAGNOSIS — I482 Chronic atrial fibrillation: Secondary | ICD-10-CM

## 2017-01-18 DIAGNOSIS — J9801 Acute bronchospasm: Secondary | ICD-10-CM

## 2017-01-18 LAB — TROPONIN I: Troponin I: <0.03 ng/mL (ref <0.03)

## 2017-01-18 LAB — PROTIME-INR
INR: 2.77
Prothrombin Time: 29 seconds — ABNORMAL HIGH (ref 11.4–15.2)

## 2017-01-18 LAB — BASIC METABOLIC PANEL
Anion gap: 8 (ref 5–15)
BUN: 23 mg/dL — ABNORMAL HIGH (ref 6–20)
CHLORIDE: 100 mmol/L — AB (ref 101–111)
CO2: 28 mmol/L (ref 22–32)
CREATININE: 0.84 mg/dL (ref 0.44–1.00)
Calcium: 8.9 mg/dL (ref 8.9–10.3)
GFR calc Af Amer: >60 mL/min (ref >60)
GFR calc non Af Amer: >60 mL/min (ref >60)
Glucose, Bld: 103 mg/dL — ABNORMAL HIGH (ref 65–99)
Potassium: 4.1 mmol/L (ref 3.5–5.1)
SODIUM: 136 mmol/L (ref 135–145)

## 2017-01-18 LAB — GLUCOSE, CAPILLARY: GLUCOSE-CAPILLARY: 123 mg/dL — AB (ref 65–99)

## 2017-01-18 MED ORDER — WARFARIN SODIUM 2.5 MG PO TABS
2.5000 mg | ORAL_TABLET | Freq: Once | ORAL | Status: AC
Start: 2017-01-18 — End: 2017-01-18
  Administered 2017-01-18: 2.5 mg via ORAL
  Filled 2017-01-18: qty 1

## 2017-01-18 MED ORDER — MAGIC MOUTHWASH
5.0000 mL | Freq: Three times a day (TID) | ORAL | Status: DC
  Administered 2017-01-18 – 2017-01-31 (×38): 5 mL via ORAL
  Filled 2017-01-18 (×48): qty 5

## 2017-01-18 NOTE — Progress Notes (Signed)
ANTIBIOTIC CONSULT NOTE  Pharmacy Consult for vancomycin and cefepime Indication: pneumonia  No Known Allergies  Patient Measurements: Height: 5\' 3"  (160 cm) Weight: 139 lb 12.4 oz (63.4 kg) IBW/kg (Calculated) : 52.4  Vital Signs: Temp: 98.7 F (37.1 C) (09/18 0613) Temp Source: Oral (09/18 0613) BP: 114/58 (09/18 4098) Pulse Rate: 88 (09/18 0856)  Labs:  Recent Labs  01/16/17 0618 01/17/17 0624 01/18/17 0619  WBC 10.9*  --   --   HGB 8.8*  --   --   PLT 458*  --   --   CREATININE  --  0.86 0.84    Estimated Creatinine Clearance: 47.9 mL/min (by C-G formula based on SCr of 0.84 mg/dL).  No results for input(s): VANCOTROUGH, VANCOPEAK, VANCORANDOM, GENTTROUGH, GENTPEAK, GENTRANDOM, TOBRATROUGH, TOBRAPEAK, TOBRARND, AMIKACINPEAK, AMIKACINTROU, AMIKACIN in the last 72 hours.   Microbiology: Recent Results (from the past 720 hour(s))  Culture, blood (routine x 2) Call MD if unable to obtain prior to antibiotics being given     Status: None (Preliminary result)   Collection Time: 01/18/2017  7:28 AM  Result Value Ref Range Status   Specimen Description BLOOD RIGHT ARM  Final   Special Requests   Final    BOTTLES DRAWN AEROBIC AND ANAEROBIC Blood Culture adequate volume   Culture NO GROWTH 4 DAYS  Final   Report Status PENDING  Incomplete  Culture, blood (routine x 2) Call MD if unable to obtain prior to antibiotics being given     Status: None (Preliminary result)   Collection Time: 01/04/2017  7:33 AM  Result Value Ref Range Status   Specimen Description BLOOD RIGHT WRIST  Final   Special Requests   Final    BOTTLES DRAWN AEROBIC AND ANAEROBIC Blood Culture adequate volume   Culture NO GROWTH 4 DAYS  Final   Report Status PENDING  Incomplete    Medical History: Past Medical History:  Diagnosis Date  . A-fib ([place])   . Alcohol abuse, in remission   . Anxiety   . Atrial fibrillation ([place])   . Depression   . Dyspnea    walking distances   . Dysrhythmia   .  History of bronchitis   . HTN (hypertension)   . Mass of right lung 07/13/2016  . Osteopenia   . Squamous cell carcinoma of right lung ([place]) 07/13/2016    Medications:  Scheduled:  . amiodarone  200 mg Oral Daily  . aspirin EC  81 mg Oral Daily  . atorvastatin  40 mg Oral q1800  . busPIRone  15 mg Oral BID  . DULoxetine  60 mg Oral Daily  . furosemide  20 mg Intravenous BID  . gabapentin  100 mg Oral QHS  . guaiFENesin  600 mg Oral BID  . ipratropium  0.5 mg Nebulization Q6H WA  . levalbuterol  1.25 mg Nebulization Q6H WA  . magic mouthwash  5 mL Oral TID PC & HS  . pantoprazole  40 mg Oral Daily  . potassium chloride  20 mEq Oral BID  . predniSONE  40 mg Oral Q breakfast  . Warfarin - Pharmacist Dosing Inpatient   Does not apply q1800   Infusions:  . ceFEPime (MAXIPIME) IV 1 g (01/18/17 0547)  . vancomycin 500 mg (01/18/17 0547)   PRN: albuterol, hydrALAZINE, nitroGLYCERIN Anti-infectives    Start     Dose/Rate Route Frequency Ordered Stop   01/17/17 1800  vancomycin (VANCOCIN) 500 mg in sodium chloride 0.9 % 100 mL IVPB  500 mg 100 mL/hr over 60 Minutes Intravenous Every 12 hours 01/17/17 0934     01/17/17 0700  vancomycin (VANCOCIN) IVPB 1000 mg/200 mL premix     1,000 mg 200 mL/hr over 60 Minutes Intravenous  Once 01/17/17 0650 01/17/17 0800   01/16/17 0000  cefpodoxime (VANTIN) 200 MG tablet     200 mg Oral 2 times daily 01/16/17 0841 01/23/17 2359   01/15/17 0600  ceFEPIme (MAXIPIME) 1 g in dextrose 5 % 50 mL IVPB     1 g 100 mL/hr over 30 Minutes Intravenous Every 24 hours 01/02/2017 0819     01/29/2017 0530  cefTRIAXone (ROCEPHIN) 1 g in dextrose 5 % 50 mL IVPB  Status:  Discontinued     1 g 100 mL/hr over 30 Minutes Intravenous  Once 01/20/2017 0518 01/11/2017 0526   01/13/2017 0530  ceFEPIme (MAXIPIME) 2 g in dextrose 5 % 50 mL IVPB     2 g 100 mL/hr over 30 Minutes Intravenous  Once 01/09/2017 0526 01/04/2017 2446     Assessment: 82 yo female admitted for shortness  of breath, being treated for PNA with cefepime.  Now adding vancomycin.   Goal of Therapy:  Vancomycin trough level 15-20 mcg/ml  Plan:  Vancomycin 500mg  IV q12hrs Check trough at steady state Cefepime 1gm IV q24hrs Monitor labs, progress, c/s  Hart Robinsons A, RPH 01/18/2017,10:33 AM

## 2017-01-18 NOTE — Progress Notes (Signed)
Subjective: She says she feels a little better. I agree with Dr. [place] Pink on his assessment that she's probably volume overloaded. Echocardiogram showed grade 2 diastolic dysfunction. She's not coughing much. She was still short of breath and hypoxic when she got up yesterday. She's complaining of a sore throat and complaining that the Foley catheter is uncomfortable.  Objective: Vital signs in last 24 hours: Temp:  [98.3 F (36.8 C)-98.7 F (37.1 C)] 98.7 F (37.1 C) (09/18 6962) Pulse Rate:  [73-87] 85 (09/18 0613) Resp:  [16-18] 18 (09/18 0613) BP: (114-118)/(58-60) 114/58 (09/18 0613) SpO2:  [92 %-100 %] 95 % (09/18 9528) Weight:  [63.4 kg (139 lb 12.4 oz)] 63.4 kg (139 lb 12.4 oz) (09/18 4132) Weight change: -1.237 kg (-2 lb 11.7 oz) Last BM Date: 01/12/17  Intake/Output from previous day: 09/17 0701 - 09/18 0700 In: 480 [P.O.:480] Out: -   PHYSICAL EXAM General appearance: alert, cooperative and mild distress Resp: She still has some crackling bilaterally Cardio: irregularly irregular rhythm GI: soft, non-tender; bowel sounds normal; no masses,  no organomegaly Extremities: extremities normal, atraumatic, no cyanosis or edema Skin warm and dry. Throat mildly erythematous  Lab Results:  Results for orders placed or performed during the hospital encounter of 01/15/2017 (from the past 48 hour(s))  Protime-INR     Status: Abnormal   Collection Time: 01/17/17  6:24 AM  Result Value Ref Range   Prothrombin Time 32.0 (H) 11.4 - 15.2 seconds   INR 3.14   Comprehensive metabolic panel     Status: Abnormal   Collection Time: 01/17/17  6:24 AM  Result Value Ref Range   Sodium 138 135 - 145 mmol/L   Potassium 4.3 3.5 - 5.1 mmol/L   Chloride 102 101 - 111 mmol/L   CO2 28 22 - 32 mmol/L   Glucose, Bld 103 (H) 65 - 99 mg/dL   BUN 20 6 - 20 mg/dL   Creatinine, Ser 0.86 0.44 - 1.00 mg/dL   Calcium 9.1 8.9 - 10.3 mg/dL   Total Protein 6.7 6.5 - 8.1 g/dL   Albumin 2.3 (L) 3.5 - 5.0  g/dL   AST 32 15 - 41 U/L   ALT 36 14 - 54 U/L   Alkaline Phosphatase 70 38 - 126 U/L   Total Bilirubin 0.5 0.3 - 1.2 mg/dL   GFR calc non Af Amer >60 >60 mL/min   GFR calc Af Amer >60 >60 mL/min    Comment: (NOTE) The eGFR has been calculated using the CKD EPI equation. This calculation has not been validated in all clinical situations. eGFR's persistently <60 mL/min signify possible Chronic Kidney Disease.    Anion gap 8 5 - 15  Procalcitonin - Baseline     Status: None   Collection Time: 01/17/17  6:24 AM  Result Value Ref Range   Procalcitonin 0.22 ng/mL    Comment:        Interpretation: PCT (Procalcitonin) <= 0.5 ng/mL: Systemic infection (sepsis) is not likely. Local bacterial infection is possible. (NOTE)         ICU PCT Algorithm               Non ICU PCT Algorithm    ----------------------------     ------------------------------         PCT < 0.25 ng/mL                 PCT < 0.1 ng/mL     Stopping of antibiotics  Stopping of antibiotics       strongly encouraged.               strongly encouraged.    ----------------------------     ------------------------------       PCT level decrease by               PCT < 0.25 ng/mL       >= 80% from peak PCT       OR PCT 0.25 - 0.5 ng/mL          Stopping of antibiotics                                             encouraged.     Stopping of antibiotics           encouraged.    ----------------------------     ------------------------------       PCT level decrease by              PCT >= 0.25 ng/mL       < 80% from peak PCT        AND PCT >= 0.5 ng/mL            Continuin g antibiotics                                              encouraged.       Continuing antibiotics            encouraged.    ----------------------------     ------------------------------     PCT level increase compared          PCT > 0.5 ng/mL         with peak PCT AND          PCT >= 0.5 ng/mL             Escalation of antibiotics                                           strongly encouraged.      Escalation of antibiotics        strongly encouraged.   Brain natriuretic peptide     Status: None   Collection Time: 01/17/17  9:13 AM  Result Value Ref Range   B Natriuretic Peptide 84.0 0.0 - 100.0 pg/mL  Protime-INR     Status: Abnormal   Collection Time: 01/18/17  6:19 AM  Result Value Ref Range   Prothrombin Time 29.0 (H) 11.4 - 15.2 seconds   INR 1.15   Basic metabolic panel     Status: Abnormal   Collection Time: 01/18/17  6:19 AM  Result Value Ref Range   Sodium 136 135 - 145 mmol/L   Potassium 4.1 3.5 - 5.1 mmol/L   Chloride 100 (L) 101 - 111 mmol/L   CO2 28 22 - 32 mmol/L   Glucose, Bld 103 (H) 65 - 99 mg/dL   BUN 23 (H) 6 - 20 mg/dL   Creatinine, Ser 0.84 0.44 - 1.00 mg/dL   Calcium 8.9 8.9 - 10.3 mg/dL   GFR calc non Af  Amer >60 >60 mL/min   GFR calc Af Amer >60 >60 mL/min    Comment: (NOTE) The eGFR has been calculated using the CKD EPI equation. This calculation has not been validated in all clinical situations. eGFR's persistently <60 mL/min signify possible Chronic Kidney Disease.    Anion gap 8 5 - 15    ABGS No results for input(s): PHART, PO2ART, TCO2, HCO3 in the last 72 hours.  Invalid input(s): PCO2 CULTURES Recent Results (from the past 240 hour(s))  Culture, blood (routine x 2) Call MD if unable to obtain prior to antibiotics being given     Status: None (Preliminary result)   Collection Time: 01/24/2017  7:28 AM  Result Value Ref Range Status   Specimen Description BLOOD RIGHT ARM  Final   Special Requests   Final    BOTTLES DRAWN AEROBIC AND ANAEROBIC Blood Culture adequate volume   Culture NO GROWTH 4 DAYS  Final   Report Status PENDING  Incomplete  Culture, blood (routine x 2) Call MD if unable to obtain prior to antibiotics being given     Status: None (Preliminary result)   Collection Time: 01/26/2017  7:33 AM  Result Value Ref Range Status   Specimen Description BLOOD RIGHT WRIST   Final   Special Requests   Final    BOTTLES DRAWN AEROBIC AND ANAEROBIC Blood Culture adequate volume   Culture NO GROWTH 4 DAYS  Final   Report Status PENDING  Incomplete   Studies/Results: Dg Chest 2 View  Result Date: 01/16/2017 CLINICAL DATA:  Shortness of breath.  History of lung cancer. EXAM: CHEST  2 VIEW COMPARISON:  01/04/2017 FINDINGS: Worsening reticular interstitial accentuation throughout the left lung but slightly favoring the left upper lobe. Continued reticular opacity and hazy density at the right lung apex. Power injectable Port-A-Cath tip: SVC. Mild enlargement of the cardiopericardial silhouette. Atherosclerotic calcification of the aortic arch. Thoracic spondylosis.  No pleural effusion. IMPRESSION: 1. New reticular opacities in both lungs possibly from atypical infectious process or edema. No definite Kerley B lines to suggest typical cardiogenic edema. 2. Some of the interstitial accentuation in the right upper lobe is thought to be related to prior radiation therapy. 3.  Aortic Atherosclerosis (ICD10-I70.0). 4. Thoracic spondylosis. Electronically Signed   By: Van Clines M.D.   On: 01/16/2017 13:09    Medications:  Prior to Admission:  Prescriptions Prior to Admission  Medication Sig Dispense Refill Last Dose  . acetaminophen (TYLENOL) 325 MG tablet Take 2 tablets (650 mg total) by mouth every 4 (four) hours as needed for mild pain, moderate pain, fever or headache. 100 tablet 1 Past Week at Unknown time  . amiodarone (PACERONE) 200 MG tablet Take 200 mg by mouth daily.    01/13/2017 at Unknown time  . aspirin EC 81 MG tablet Take 81 mg by mouth daily.   01/13/2017 at Unknown time  . atorvastatin (LIPITOR) 40 MG tablet TAKE 1 TABLET (40 MG TOTAL) BY MOUTH DAILY. FOR HYPERLIPIDEMIA 90 tablet 1 01/13/2017 at Unknown time  . busPIRone (BUSPAR) 15 MG tablet Take 1 tablet (15 mg total) by mouth 2 (two) times daily. 180 tablet 2 01/13/2017 at Unknown time  . DULoxetine  (CYMBALTA) 60 MG capsule Take 1 capsule (60 mg total) by mouth daily. 30 capsule 2 01/13/2017 at Unknown time  . fish oil-omega-3 fatty acids 1000 MG capsule Take 3 capsules (3 g total) by mouth daily. For hyperlipidemia. (Patient taking differently: Take 1 g by mouth 3 (three)  times daily. For hyperlipidemia.) 30 capsule  01/13/2017 at Unknown time  . fluticasone (FLONASE) 50 MCG/ACT nasal spray Place 2 sprays into both nostrils 2 (two) times daily. Use as needed (Patient taking differently: Place 2 sprays into both nostrils 2 (two) times daily as needed for allergies. Use as needed) 16 g 6 unknown  . gabapentin (NEURONTIN) 100 MG capsule Take 1 capsule (100 mg total) by mouth at bedtime. 90 capsule 2 01/13/2017 at Unknown time  . nitroGLYCERIN (NITROSTAT) 0.4 MG SL tablet Place 0.4 mg under the tongue.   unknown  . omeprazole (PRILOSEC) 40 MG capsule Take 1 capsule (40 mg total) by mouth daily. 30 capsule 2 01/13/2017 at Unknown time  . warfarin (COUMADIN) 2.5 MG tablet Take 3 mg by mouth daily.    01/13/2017 at 0730  . [DISCONTINUED] HYDROcodone-homatropine (HYCODAN) 5-1.5 MG/5ML syrup Take 5 mLs by mouth every 6 (six) hours as needed for cough.   01/13/2017 at Unknown time   Scheduled: . amiodarone  200 mg Oral Daily  . aspirin EC  81 mg Oral Daily  . atorvastatin  40 mg Oral q1800  . busPIRone  15 mg Oral BID  . DULoxetine  60 mg Oral Daily  . furosemide  20 mg Intravenous BID  . gabapentin  100 mg Oral QHS  . guaiFENesin  600 mg Oral BID  . ipratropium  0.5 mg Nebulization Q6H WA  . levalbuterol  1.25 mg Nebulization Q6H WA  . magic mouthwash  5 mL Oral TID PC & HS  . pantoprazole  40 mg Oral Daily  . potassium chloride  20 mEq Oral BID  . predniSONE  40 mg Oral Q breakfast  . Warfarin - Pharmacist Dosing Inpatient   Does not apply q1800   Continuous: . ceFEPime (MAXIPIME) IV 1 g (01/18/17 0547)  . vancomycin 500 mg (01/18/17 0547)   XVQ:[id number], hydrALAZINE,  nitroGLYCERIN  Assesment:She was admitted with healthcare associated pneumonia. She has acute hypoxic respiratory failure. She has chronic atrial fib and echocardiogram shows grade 2 diastolic dysfunction and I think she is volume overloaded. She is diuresing now. I think she's better today. Principal Problem:   Hypoxia Active Problems:   Depression   Essential hypertension, benign   Hyperlipidemia   Atrial fibrillation ([facility]) [I48.91]   HCAP (healthcare-associated pneumonia)   Hypokalemia    Plan: She will continue diuresis. I added magic mouthwash for her throat. I do think she'll probably need oxygen at home at least short-term and it may help her to have a nebulizer.    LOS: 2 days   Montanna Mcbain L 01/18/2017, 8:46 AM

## 2017-01-18 NOTE — Progress Notes (Signed)
[facility] PROGRESS NOTE  JACQULIN BRANDENBURGER VQM:[id number] DOB: December 23, 1934 DOA: 01/06/2017   PCP: [name], FNP     Assessment/Plan: Principal Problem:   Hypoxia Active Problems:   Depression   Essential hypertension, benign   Hyperlipidemia   Atrial fibrillation (HCC) [I48.91]   HCAP (healthcare-associated pneumonia)   Hypokalemia   Bronchospasm   82 y.o.femalewith past medical history of history of right upper lobe non-small cell cancer which is been treated with radiation and chemotherapy ,atrial fibrillation,anxiety , depression,hypertension , COPD and lung mass who recently finished chemotherapy who presents with shortness of breathin the setting of recent pneumonia which still hasn't cleared up. Patient was recently seen by cardiothoracic surgery and evaluated for recurrent right lower lobe mass versus infiltrate. CT chest on 9/12 showed posttreatment changes with residual nodular consolidation, resolving collapse/consolidation of the right lower lobe without complete resolution. Multifocal pulmonary groundglass opacity some of which is new. Patient admitted for COPD exacerbation, also placed on broad-spectrum antibiotics, possibly due to residual pneumonia that needs to be treated According to Dr. Debby Freiberg surgery patient does not need biopsy, based on CT results.  Over the past few days, patient has improved little. Prior to admission, she was not on any oxygen. Today, she is requiring 4 L nasal cannula satting just at 90% at rest. An attempt by physical therapy to get her to stand to walk over to the chair with assistance, led to drop in oxygen saturations into the mid 70s.  HOSPITAL COURSE:   Right lower lobe pneumonia Patient started on broad-spectrum antibiotics including vancomycin and cefepime along with IV steroids ( changed to PO prednisone )and Scheduled Neb's. Antibiotic will be  changed to [facility] for another 7 days at discharge   Clinical course waxing and waning, previously not on oxygen, now on 4-5 L Pro calcitonin pending mucinex, oral prednisone times another 5 days,  Patient not ready to go home today because of dyspnea on exertion and dizziness. Requested pulmonary to evaluate supportive therapy at home including a home nebulizer device and home health wean oxygen as able at home  Orthostatic hypotension Near-syncopal episodes with systolic blood pressure dropping into the 80s and the patient was on the commode Patient recovered once supine did not need any fluid boluses Lasix discontinued  Acute on chronic diastolic heart failure Recent echo in July EF 60-65% with grade 1 diastolic dysfunction on repeat echo I suspect she is quite a bit volume overloaded, even more so than admission. Weight on admission was 131 pounds and now 142>139.  Became orthostatic, hold IV   Lasix.    Leukocytosis-white count still slightly elevated likely secondary to prednisone  Hypokalemia-repleted   Mild dehdyration IVF Due to illness  Anemia-hemoglobin drop from 9.2>8.5>8.8 Chronic and stable , no signs of active bleeding  Hyperlipidemia Continue statin  Hypertension Stable  Depression w/ anxiety No  suicidal or homicidal ideation Cont buspar, cymbalta  Paroxysmal Afib-rate controlled Warfarin per pharmacy,INR became supra therapeutic likely secondary to volume overload leading to secondary to hepatic congestion leading to decreased Coumadin clearing. By diuresing, this should greatly help  Cont amiodarone, patient would need close outpatient INR monitoring INR checked on Tuesday Brief burst of SVT on telemetry, likely secondary to albuterol/hypoxia Changed albuterol to Xopenex  GERD Cont PPI   Chronic pain Cont neurontin  CAD Continue statin   DVT prophylaxsis Coumadin  Code Status:  Full code    Family Communication:  multiple family members at  the bedside   Disposition Plan:  Not stable for discharge , patient would benefit from outpatient pulmonary rehabilitation      consultants  Pulmonary  Procedures  Echocardiogram done 9/17: Results pending  Antibiotics: Anti-infectives    Start     Dose/Rate Route Frequency Ordered Stop   01/17/17 1800  vancomycin (VANCOCIN) 500 mg in sodium chloride 0.9 % 100 mL IVPB     500 mg 100 mL/hr over 60 Minutes Intravenous Every 12 hours 01/17/17 0934     01/17/17 0700  vancomycin (VANCOCIN) IVPB 1000 mg/200 mL premix     1,000 mg 200 mL/hr over 60 Minutes Intravenous  Once 01/17/17 0650 01/17/17 0800   01/16/17 0000  cefpodoxime (VANTIN) 200 MG tablet     200 mg Oral 2 times daily 01/16/17 0841 01/23/17 2359   01/15/17 0600  ceFEPIme (MAXIPIME) 1 g in dextrose 5 % 50 mL IVPB     1 g 100 mL/hr over 30 Minutes Intravenous Every 24 hours 01/03/2017 0819     01/29/2017 0530  cefTRIAXone (ROCEPHIN) 1 g in dextrose 5 % 50 mL IVPB  Status:  Discontinued     1 g 100 mL/hr over 30 Minutes Intravenous  Once 01/22/2017 0518 01/13/2017 0526   01/26/2017 0530  ceFEPIme (MAXIPIME) 2 g in dextrose 5 % 50 mL IVPB     2 g 100 mL/hr over 30 Minutes Intravenous  Once 01/18/2017 0526 01/22/2017 7628        Objective: Vitals:   01/17/17 2027 01/17/17 2100 01/18/17 0613 01/18/17 0856  BP:  118/60 (!) 114/58   Pulse: 73 87 85 88  Resp: 16 18 18 16   Temp:  98.3 F (36.8 C) 98.7 F (37.1 C)   TempSrc:  Oral Oral   SpO2: 93% 92% 95% 92%  Weight:   63.4 kg (139 lb 12.4 oz)   Height:        Intake/Output Summary (Last 24 hours) at 01/18/17 1139 Last data filed at 01/18/17 0900  Gross per 24 hour  Intake              480 ml  Output                0 ml  Net              480 ml    Exam:  Examination:  General exam:Alert and oriented 3, mildly agitated when she became severely dyspneic and hypoxic HEENT: Normocephalic atraumatic on because remains are slightly dry Neck: Supple, no JVD Cardiovascular: Regular rate and rhythm,  S1-S2 Lungs: Decreased breath sounds bibasilar, breathing moderately labored Abdomen: Soft, nontender, nondistended, positive bowel sounds Extremities: No clubbing or cyanosis or edema Skin: No skin breaks, tears or lesions Psychiatric: Patient is appropriate, no evidence of psychoses Neuro: No focal deficits   Data Reviewed: I have personally reviewed following labs and imaging studies  Micro Results Recent Results (from the past 240 hour(s))  Culture, blood (routine x 2) Call MD if unable to obtain prior to antibiotics being given     Status: None (Preliminary result)   Collection Time: 01/21/2017  7:28 AM  Result Value Ref Range Status   Specimen Description BLOOD RIGHT ARM  Final   Special Requests   Final    BOTTLES DRAWN AEROBIC AND ANAEROBIC Blood Culture adequate volume   Culture NO GROWTH 4 DAYS  Final   Report Status PENDING  Incomplete  Culture, blood (routine x 2) Call MD if unable  to obtain prior to antibiotics being given     Status: None (Preliminary result)   Collection Time: 01/23/2017  7:33 AM  Result Value Ref Range Status   Specimen Description BLOOD RIGHT WRIST  Final   Special Requests   Final    BOTTLES DRAWN AEROBIC AND ANAEROBIC Blood Culture adequate volume   Culture NO GROWTH 4 DAYS  Final   Report Status PENDING  Incomplete    Radiology Reports Dg Chest 2 View  Result Date: 01/16/2017 CLINICAL DATA:  Shortness of breath.  History of lung cancer. EXAM: CHEST  2 VIEW COMPARISON:  01/05/2017 FINDINGS: Worsening reticular interstitial accentuation throughout the left lung but slightly favoring the left upper lobe. Continued reticular opacity and hazy density at the right lung apex. Power injectable Port-A-Cath tip: SVC. Mild enlargement of the cardiopericardial silhouette. Atherosclerotic calcification of the aortic arch. Thoracic spondylosis.  No pleural effusion. IMPRESSION: 1. New reticular opacities in both lungs possibly from atypical infectious process or  edema. No definite Kerley B lines to suggest typical cardiogenic edema. 2. Some of the interstitial accentuation in the right upper lobe is thought to be related to prior radiation therapy. 3.  Aortic Atherosclerosis (ICD10-I70.0). 4. Thoracic spondylosis. Electronically Signed   By: Van Clines M.D.   On: 01/16/2017 13:09   Dg Chest 2 View  Result Date: 01/10/2017 CLINICAL DATA:  Shortness of breath.  Cough and wheezing. EXAM: CHEST  2 VIEW COMPARISON:  CT 2 days prior.  Radiograph 12/29/2016 FINDINGS: Tip of the right chest port in the mid SVC. Post treatment related changes in the right upper lobe. Consolidative opacity anteriorly on CT not well seen radiographically. Persistent right lung volume loss. Mild cardiomegaly with atherosclerosis of the thoracic aorta. Ill-defined left mid and lower lung opacities core spine a ground-glass opacities on prior CT. No pneumothorax or pleural effusion. Stable osseous structures. IMPRESSION: 1. Similar appearance of the chest from recent CT allowing for differences in modality. Post treatment changes in the right upper lobe. Left mid lower lung zone opacities corresponding to ground-glass on CT, may be infectious or inflammatory in etiology. 2. Mild cardiomegaly.  Aortic atherosclerosis. Electronically Signed   By: Jeb Levering M.D.   On: 01/13/2017 04:28   Dg Chest 2 View  Result Date: 12/30/2016 CLINICAL DATA:  Pneumonia, follow-up EXAM: CHEST  2 VIEW COMPARISON:  CT 12/15/2015.  Chest x-ray 12/02/2016. FINDINGS: Right Port-A-Cath remains in place, unchanged. Right upper lobe mass again noted. Previously seen left upper lobe pneumonia has cleared. No effusions or acute airspace opacities. Heart is normal size. IMPRESSION: Interval clearance of the left upper lobe pneumonia. No new confluent airspace opacities. Right upper lobe mass again noted. Electronically Signed   By: Rolm Baptise M.D.   On: 12/30/2016 07:40   Ct Chest Wo Contrast  Result Date:  01/12/2017 CLINICAL DATA:  Right lung cancer, recent pneumonia, cough and shortness of breath. EXAM: CT CHEST WITHOUT CONTRAST TECHNIQUE: Multidetector CT imaging of the chest was performed following the standard protocol without IV contrast. COMPARISON:  12/14/2016 and PET 07/07/2016. FINDINGS: Cardiovascular: Right IJ Port-A-Cath terminates in the low SVC. Atherosclerotic calcification of the arterial vasculature, including three-vessel involvement of the coronary arteries. Heart is at the upper limits of normal in size to mildly enlarged. No pericardial effusion. Mediastinum/Nodes: Supraclavicular and mediastinal lymph nodes are not enlarged by CT size criteria. Hilar regions are difficult to evaluate without IV contrast. No axillary adenopathy. Esophagus is grossly unremarkable. Lungs/Pleura: Centrilobular and paraseptal emphysema.  Patchy ground-glass is seen in the lungs bilaterally, worst in the right upper lobe, as before. Ground-glass in the left lung has increased slightly, however. Spiculated soft tissue consolidation in the anterior right upper lobe measures 1.9 x 2.7 cm, likely stable. Resolving collapse/ consolidation in the right lower lobe with residual ground-glass and minimal consolidation. No pleural fluid. Airway is unremarkable. Upper Abdomen: Visualized portion of the liver is unremarkable. 9 mm nodule in the medial limb right adrenal gland measures 20 Hounsfield units, stable in size from 12/14/2016 and fluid in density on 07/07/2016. There may be nodular thickening of the left adrenal gland. Visualized portions of the kidneys, spleen, pancreas, stomach and bowel are grossly unremarkable. Musculoskeletal: Degenerative changes in the spine. IMPRESSION: 1. Posttreatment changes in the upper right hemithorax with residual nodular consolidation anteriorly, stable. 2. Resolving collapse/consolidation in the right lower lobe without complete resolution. 3. Multifocal pulmonary parenchymal  ground-glass, some of which is new and likely infectious or inflammatory in etiology. 4. Aortic atherosclerosis (ICD10-170.0). Coronary artery calcification. 5.  Emphysema (ICD10-J43.9). 6. Right adrenal adenoma. Electronically Signed   By: Lorin Picket M.D.   On: 01/12/2017 14:51     CBC  Recent Labs Lab 01/12/2017 0403 01/15/17 0644 01/16/17 0618  WBC 9.5 13.9* 10.9*  HGB 9.2* 8.5* 8.8*  HCT 28.4* 25.9* 26.8*  PLT 417* 393 458*  MCV 89.9 90.2 91.2  MCH 29.1 29.6 29.9  MCHC 32.4 32.8 32.8  RDW 16.1* 16.3* 16.6*  LYMPHSABS 1.1 0.6*  --   MONOABS 1.2* 1.3*  --   EOSABS 0.1 0.0  --   BASOSABS 0.0 0.0  --     Chemistries   Recent Labs Lab 01/05/2017 0403 01/15/17 0644 01/17/17 0624 01/18/17 0619  NA 135 141 138 136  K 3.3* 5.0 4.3 4.1  CL 101 110 102 100*  CO2 25 24 28 28   GLUCOSE 117* 147* 103* 103*  BUN 14 19 20  23*  CREATININE 0.94 0.71 0.86 0.84  CALCIUM 8.8* 9.1 9.1 8.9  AST  --   --  32  --   ALT  --   --  36  --   ALKPHOS  --   --  70  --   BILITOT  --   --  0.5  --    ------------------------------------------------------------------------------------------------------------------ estimated creatinine clearance is 47.9 mL/min (by C-G formula based on SCr of 0.84 mg/dL). ------------------------------------------------------------------------------------------------------------------ No results for input(s): HGBA1C in the last 72 hours. ------------------------------------------------------------------------------------------------------------------ No results for input(s): CHOL, HDL, LDLCALC, TRIG, CHOLHDL, LDLDIRECT in the last 72 hours. ------------------------------------------------------------------------------------------------------------------ No results for input(s): TSH, T4TOTAL, T3FREE, THYROIDAB in the last 72 hours.  Invalid input(s):  FREET3 ------------------------------------------------------------------------------------------------------------------ No results for input(s): VITAMINB12, FOLATE, FERRITIN, TIBC, IRON, RETICCTPCT in the last 72 hours.  Coagulation profile  Recent Labs Lab 01/16/2017 0403 01/15/17 0644 01/16/17 0618 01/17/17 0624 01/18/17 0619  INR 2.76 4.10* 4.28* 3.14 2.77    No results for input(s): DDIMER in the last 72 hours.  Cardiac Enzymes No results for input(s): CKMB, TROPONINI, MYOGLOBIN in the last 168 hours.  Invalid input(s): CK ------------------------------------------------------------------------------------------------------------------ Invalid input(s): POCBNP   CBG: No results for input(s): GLUCAP in the last 168 hours.     Studies: Dg Chest 2 View  Result Date: 01/16/2017 CLINICAL DATA:  Shortness of breath.  History of lung cancer. EXAM: CHEST  2 VIEW COMPARISON:  01/22/2017 FINDINGS: Worsening reticular interstitial accentuation throughout the left lung but slightly favoring the left upper lobe. Continued reticular opacity and hazy  density at the right lung apex. Power injectable Port-A-Cath tip: SVC. Mild enlargement of the cardiopericardial silhouette. Atherosclerotic calcification of the aortic arch. Thoracic spondylosis.  No pleural effusion. IMPRESSION: 1. New reticular opacities in both lungs possibly from atypical infectious process or edema. No definite Kerley B lines to suggest typical cardiogenic edema. 2. Some of the interstitial accentuation in the right upper lobe is thought to be related to prior radiation therapy. 3.  Aortic Atherosclerosis (ICD10-I70.0). 4. Thoracic spondylosis. Electronically Signed   By: Van Clines M.D.   On: 01/16/2017 13:09      No results found for: HGBA1C Lab Results  Component Value Date   LDLCALC 100 (H) 05/21/2016   CREATININE 0.84 01/18/2017       Scheduled Meds: . amiodarone  200 mg Oral Daily  . aspirin EC   81 mg Oral Daily  . atorvastatin  40 mg Oral q1800  . busPIRone  15 mg Oral BID  . DULoxetine  60 mg Oral Daily  . furosemide  20 mg Intravenous BID  . gabapentin  100 mg Oral QHS  . guaiFENesin  600 mg Oral BID  . ipratropium  0.5 mg Nebulization Q6H WA  . levalbuterol  1.25 mg Nebulization Q6H WA  . magic mouthwash  5 mL Oral TID PC & HS  . pantoprazole  40 mg Oral Daily  . potassium chloride  20 mEq Oral BID  . predniSONE  40 mg Oral Q breakfast  . warfarin  2.5 mg Oral Once  . Warfarin - Pharmacist Dosing Inpatient   Does not apply q1800   Continuous Infusions: . ceFEPime (MAXIPIME) IV 1 g (01/18/17 0547)  . vancomycin 500 mg (01/18/17 0547)     LOS: 2 days    Time spent: >30 MINS    Reyne Dumas  [facility] Pager [phone]. If 7PM-7AM, please contact night-coverage at [email or web address], password [facility] 01/18/2017, 11:39 AM  LOS: 2 days

## 2017-01-18 NOTE — Progress Notes (Signed)
Patient's SOB is better. Family and RN at bedside. Patient switched from a NRB mask with nasal cannula to a HFNC at 15 LPM. O2 sat 95%.

## 2017-01-18 NOTE — Progress Notes (Signed)
PT Cancellation Note  Patient Details Name: Alexandria Chen MRN: [id number] DOB: 1935-04-29   Cancelled Treatment:    Reason Eval/Treat Not Completed: Medical issues which prohibited therapy.  Treatment held secondary to patient had rapid response due to BP dropping while up and RN stated patient not be out of bed for rest of today.   3:23 PM, 01/18/17 Lonell Grandchild, MPT Physical Therapist with [facility] [phone] office [phone] mobile phone

## 2017-01-18 NOTE — Progress Notes (Signed)
**Note De-Identified Alexandria Chen Obfuscation** STAT EKG complete and placed in patient chart

## 2017-01-18 NOTE — Progress Notes (Signed)
Central tele called to report patient O2 levels dropped to 75-80, upon entering the room, patient having labored breathing.  Patient was on 5 liters [place].  Called respiratory to render breathing treatment and assist with support with patient.  Non-rebreather mask placed on patient at 15 liters, O2 improved to 90, respiratory gave breathing treatment, and patient has recovered to 98 percent O2.

## 2017-01-18 NOTE — Consult Note (Signed)
   [facility] CM Inpatient Consult   01/18/2017  Alexandria Chen January 04, 1935 [id number]   Patient screened for [facility] program services. Patient is eligible through their [employer], however transition of care contact post dicharge will be conducted by Primary Care Provider office and referral will be sent from MD office if needs persist.  Needs identified this admit are frequent admits, 3 in 6 months.Damaris Schooner with patient and pamphlet given explaining services. Notification will be sent to provider office contact personnel Olivia Mackie.  Made inpatient RNCM aware of the above. Please contact for further questions:  Iyannah Blake RN, [facility] Liaison  [phone]) [place] [phone]) Toll free office

## 2017-01-18 NOTE — Progress Notes (Signed)
ANTICOAGULATION CONSULT NOTE -follow up  Pharmacy Consult for Coumadin Indication: atrial fibrillation  No Known Allergies  Patient Measurements: Height: 5\' 3"  (160 cm) Weight: 139 lb 12.4 oz (63.4 kg) IBW/kg (Calculated) : 52.4   Vital Signs: Temp: 98.7 F (37.1 C) (09/18 0613) Temp Source: Oral (09/18 0613) BP: 114/58 (09/18 1607) Pulse Rate: 88 (09/18 0856)  Labs:  Recent Labs  01/16/17 0618 01/17/17 0624 01/18/17 0619  HGB 8.8*  --   --   HCT 26.8*  --   --   PLT 458*  --   --   LABPROT 40.8* 32.0* 29.0*  INR 4.28* 3.14 2.77  CREATININE  --  0.86 0.84    Estimated Creatinine Clearance: 47.9 mL/min (by C-G formula based on SCr of 0.84 mg/dL).   Medical History: Past Medical History:  Diagnosis Date  . A-fib ([place])   . Alcohol abuse, in remission   . Anxiety   . Atrial fibrillation ([place])   . Depression   . Dyspnea    walking distances   . Dysrhythmia   . History of bronchitis   . HTN (hypertension)   . Mass of right lung 07/13/2016  . Osteopenia   . Squamous cell carcinoma of right lung ([place]) 07/13/2016    Medications:  Prescriptions Prior to Admission  Medication Sig Dispense Refill Last Dose  . acetaminophen (TYLENOL) 325 MG tablet Take 2 tablets (650 mg total) by mouth every 4 (four) hours as needed for mild pain, moderate pain, fever or headache. 100 tablet 1 Past Week at Unknown time  . amiodarone (PACERONE) 200 MG tablet Take 200 mg by mouth daily.    01/13/2017 at Unknown time  . aspirin EC 81 MG tablet Take 81 mg by mouth daily.   01/13/2017 at Unknown time  . atorvastatin (LIPITOR) 40 MG tablet TAKE 1 TABLET (40 MG TOTAL) BY MOUTH DAILY. FOR HYPERLIPIDEMIA 90 tablet 1 01/13/2017 at Unknown time  . busPIRone (BUSPAR) 15 MG tablet Take 1 tablet (15 mg total) by mouth 2 (two) times daily. 180 tablet 2 01/13/2017 at Unknown time  . DULoxetine (CYMBALTA) 60 MG capsule Take 1 capsule (60 mg total) by mouth daily. 30 capsule 2 01/13/2017 at Unknown time  .  fish oil-omega-3 fatty acids 1000 MG capsule Take 3 capsules (3 g total) by mouth daily. For hyperlipidemia. (Patient taking differently: Take 1 g by mouth 3 (three) times daily. For hyperlipidemia.) 30 capsule  01/13/2017 at Unknown time  . fluticasone (FLONASE) 50 MCG/ACT nasal spray Place 2 sprays into both nostrils 2 (two) times daily. Use as needed (Patient taking differently: Place 2 sprays into both nostrils 2 (two) times daily as needed for allergies. Use as needed) 16 g 6 unknown  . gabapentin (NEURONTIN) 100 MG capsule Take 1 capsule (100 mg total) by mouth at bedtime. 90 capsule 2 01/13/2017 at Unknown time  . nitroGLYCERIN (NITROSTAT) 0.4 MG SL tablet Place 0.4 mg under the tongue.   unknown  . omeprazole (PRILOSEC) 40 MG capsule Take 1 capsule (40 mg total) by mouth daily. 30 capsule 2 01/13/2017 at Unknown time  . warfarin (COUMADIN) 2.5 MG tablet Take 3 mg by mouth daily.    01/13/2017 at 0730  . [DISCONTINUED] HYDROcodone-homatropine (HYCODAN) 5-1.5 MG/5ML syrup Take 5 mLs by mouth every 6 (six) hours as needed for cough.   01/13/2017 at Unknown time    Assessment: 82 y.o. female  with past medical history of atrial fibrillation,  Anxiety,  Depression, hypertension and lung  mass who recently finished chemotherapy. Patient presents with shortness of breath. Pharmacy asked to dose Coumadin. Patient is followed by anticoag clinic and takes 3mg  on Sunday and Thursday  And 1.5mg  On Monday, Tuesday, Wednesday, Friday and Saturday. Patient's INR has now trended down to therapeutic level, pt has not received any coumadin since admission due to elevated INR  Goal of Therapy:  INR 2-3 Monitor platelets by anticoagulation protocol: Yes   Plan:  Coumadin 2.5mg  today x 1 (to discourage rapid drop in INR) PT-INR daily Monitor for S/S of bleeding  Hart Robinsons, PharmD Clinical Pharmacist Pager:  [phone] 01/18/2017   01/18/2017,10:36 AM

## 2017-01-19 LAB — BASIC METABOLIC PANEL
Anion gap: 6 (ref 5–15)
BUN: 24 mg/dL — ABNORMAL HIGH (ref 6–20)
CHLORIDE: 102 mmol/L (ref 101–111)
CO2: 27 mmol/L (ref 22–32)
Calcium: 8.8 mg/dL — ABNORMAL LOW (ref 8.9–10.3)
Creatinine, Ser: 0.86 mg/dL (ref 0.44–1.00)
GFR calc non Af Amer: >60 mL/min (ref >60)
Glucose, Bld: 94 mg/dL (ref 65–99)
POTASSIUM: 4.4 mmol/L (ref 3.5–5.1)
Sodium: 135 mmol/L (ref 135–145)

## 2017-01-19 LAB — CBC
HCT: 24.3 % — ABNORMAL LOW (ref 36.0–46.0)
HEMOGLOBIN: 8 g/dL — AB (ref 12.0–15.0)
MCH: 29.4 pg (ref 26.0–34.0)
MCHC: 32.9 g/dL (ref 30.0–36.0)
MCV: 89.3 fL (ref 78.0–100.0)
Platelets: 510 10*3/uL — ABNORMAL HIGH (ref 150–400)
RBC: 2.72 MIL/uL — AB (ref 3.87–5.11)
RDW: 16.7 % — ABNORMAL HIGH (ref 11.5–15.5)
WBC: 12.3 10*3/uL — ABNORMAL HIGH (ref 4.0–10.5)

## 2017-01-19 LAB — CULTURE, BLOOD (ROUTINE X 2)
CULTURE: NO GROWTH
Culture: NO GROWTH
SPECIAL REQUESTS: ADEQUATE
Special Requests: ADEQUATE

## 2017-01-19 LAB — PROTIME-INR
INR: 2.46
Prothrombin Time: 26.5 seconds — ABNORMAL HIGH (ref 11.4–15.2)

## 2017-01-19 LAB — TROPONIN I

## 2017-01-19 LAB — PROCALCITONIN: PROCALCITONIN: 0.26 ng/mL

## 2017-01-19 MED ORDER — METHYLPREDNISOLONE SODIUM SUCC 40 MG IJ SOLR
40.0000 mg | Freq: Two times a day (BID) | INTRAMUSCULAR | Status: DC
  Administered 2017-01-19 – 2017-01-22 (×7): 40 mg via INTRAVENOUS
  Filled 2017-01-19 (×7): qty 1

## 2017-01-19 MED ORDER — WARFARIN SODIUM 2.5 MG PO TABS
2.5000 mg | ORAL_TABLET | Freq: Once | ORAL | Status: AC
  Administered 2017-01-19: 2.5 mg via ORAL
  Filled 2017-01-19: qty 1

## 2017-01-19 NOTE — Progress Notes (Signed)
PROGRESS NOTE    Alexandria Chen  MWN:[id number] DOB: Feb 26, 1935 DOA: 01/23/2017 PCP: [name], FNP     Brief Narrative:  82 year old woman admitted from home on 9/14 due to shortness of breath. She has a history of atrial fibrillation, anxiety, depression, hypertension and lung cancer who recently finished chemotherapy. She was recently treated for pneumonia in August. Came in due to continued weakness and difficulty breathing with ambulation. 911 was activated and patient was found to be hypoxic and was brought into the hospital for evaluation.   Assessment & Plan:   Principal Problem:   Hypoxia Active Problems:   Depression   Essential hypertension, benign   Hyperlipidemia   Atrial fibrillation (HCC) [I48.91]   HCAP (healthcare-associated pneumonia)   Hypokalemia   Bronchospasm   Acute hypoxemic respiratory failure -Believed to be multifactorial: Healthcare associated pneumonia, acute diastolic dysfunction, COPD (not formally diagnosed but does have an extensive smoking history and significant wheezing on exam), also history of lung cancer. -Plan to continue antibiotic therapy with vancomycin and cefepime, length of treatment approximately to be 8-10 days. -Dr. Luan Pulling has added steroids today given her wheezing on exam I agree. Will also add incentive spirometry with flutter valve. Continue nebs. -She currently appears euvolemic. Lasix is on hold due to orthostatic hypotension. -She is still requiring a high concentration of oxygen, 9-10 L high flow.  Depression with anxiety -No suicidal or homicidal ideation. -Continue Cymbalta, BuSpar  Hypertension Stable, no longer hypotensive  Hyperlipidemia -Continue statin   paroxysmal atrial fibrillation -Anticoagulated on Coumadin, currently rate controlled.  GERD -Continue PPI  Coronary artery disease -Stable, no chest pain  Chronic pain syndrome -Continue Neurontin   DVT prophylaxis: Anticoagulated on  Coumadin Code Status: Full Code Family Communication: daughter at bedside updated on plan of care and questions answered  Disposition Plan: Pending decreasing oxygen requirements   Consultants:   Pulmonary, Dr. Luan Pulling   Procedures:   None   Antimicrobials:  Anti-infectives    Start     Dose/Rate Route Frequency Ordered Stop   01/17/17 1800  vancomycin (VANCOCIN) 500 mg in sodium chloride 0.9 % 100 mL IVPB     500 mg 100 mL/hr over 60 Minutes Intravenous Every 12 hours 01/17/17 0934     01/17/17 0700  vancomycin (VANCOCIN) IVPB 1000 mg/200 mL premix     1,000 mg 200 mL/hr over 60 Minutes Intravenous  Once 01/17/17 0650 01/17/17 0800   01/16/17 0000  cefpodoxime (VANTIN) 200 MG tablet     200 mg Oral 2 times daily 01/16/17 0841 01/23/17 2359   01/15/17 0600  ceFEPIme (MAXIPIME) 1 g in dextrose 5 % 50 mL IVPB     1 g 100 mL/hr over 30 Minutes Intravenous Every 24 hours 01/20/2017 0819     01/05/2017 0530  cefTRIAXone (ROCEPHIN) 1 g in dextrose 5 % 50 mL IVPB  Status:  Discontinued     1 g 100 mL/hr over 30 Minutes Intravenous  Once 01/10/2017 0518 01/06/2017 0526   01/13/2017 0530  ceFEPIme (MAXIPIME) 2 g in dextrose 5 % 50 mL IVPB     2 g 100 mL/hr over 30 Minutes Intravenous  Once 01/27/2017 0526 01/29/2017 4034       Subjective: Says her shortness of breath is somewhat improved, however still requiring high amounts of oxygen, denies chest pain.   Objective: Vitals:   01/19/17 1031 01/19/17 1300 01/19/17 1330 01/19/17 1428  BP:  134/66    Pulse:  87  Resp:  20    Temp:  99.4 F (37.4 C) 98.7 F (37.1 C)   TempSrc:  Oral Oral   SpO2: 100% 97%  94%  Weight:      Height:        Intake/Output Summary (Last 24 hours) at 01/19/17 1816 Last data filed at 01/19/17 1749  Gross per 24 hour  Intake              720 ml  Output             1400 ml  Net             -680 ml   Filed Weights   01/17/17 0525 01/18/17 0613 01/19/17 0614  Weight: 64.6 kg (142 lb 8 oz) 63.4 kg (139 lb  12.4 oz) 64 kg (141 lb 1.6 oz)    Examination:  General exam: Alert, awake, oriented x 3 Respiratory systemBilateral expiratory wheezes, no crackles or rhonchiar system:RRR. No murmurs, rubs, gallops. Gastrointestinal system: Abdomen is nondistended, soft and nontender. No organomegaly or masses felt. Normal bowel sounds heard. Central nervous system: Alert and oriented. No focal neurological deficits. Extremities: No C/C/E, +pedal pulses Skin: No rashes, lesions or ulcers Psychiatry: Judgement and insight appear normal. Mood & affect appropriate.     Data Reviewed: I have personally reviewed following labs and imaging studies  CBC:  Recent Labs Lab 01/27/2017 0403 01/15/17 0644 01/16/17 0618 01/19/17 0234  WBC 9.5 13.9* 10.9* 12.3*  NEUTROABS 7.2 12.1*  --   --   HGB 9.2* 8.5* 8.8* 8.0*  HCT 28.4* 25.9* 26.8* 24.3*  MCV 89.9 90.2 91.2 89.3  PLT 417* 393 458* 237*   Basic Metabolic Panel:  Recent Labs Lab 01/06/2017 0403 01/15/17 0644 01/17/17 0624 01/18/17 0619 01/19/17 0234  NA 135 141 138 136 135  K 3.3* 5.0 4.3 4.1 4.4  CL 101 110 102 100* 102  CO2 25 24 28 28 27   GLUCOSE 117* 147* 103* 103* 94  BUN 14 19 20  23* 24*  CREATININE 0.94 0.71 0.86 0.84 0.86  CALCIUM 8.8* 9.1 9.1 8.9 8.8*   GFR: Estimated Creatinine Clearance: 46.9 mL/min (by C-G formula based on SCr of 0.86 mg/dL). Liver Function Tests:  Recent Labs Lab 01/17/17 0624  AST 32  ALT 36  ALKPHOS 70  BILITOT 0.5  PROT 6.7  ALBUMIN 2.3*   No results for input(s): LIPASE, AMYLASE in the last 168 hours. No results for input(s): AMMONIA in the last 168 hours. Coagulation Profile:  Recent Labs Lab 01/15/17 0644 01/16/17 0618 01/17/17 0624 01/18/17 0619 01/19/17 0234  INR 4.10* 4.28* 3.14 2.77 2.46   Cardiac Enzymes:  Recent Labs Lab 01/18/17 1151 01/18/17 1946 01/19/17 0234  TROPONINI <0.03 <0.03 <0.03   BNP (last 3 results) No results for input(s): PROBNP in the last 8760  hours. HbA1C: No results for input(s): HGBA1C in the last 72 hours. CBG:  Recent Labs Lab 01/18/17 1143  GLUCAP 123*   Lipid Profile: No results for input(s): CHOL, HDL, LDLCALC, TRIG, CHOLHDL, LDLDIRECT in the last 72 hours. Thyroid Function Tests: No results for input(s): TSH, T4TOTAL, FREET4, T3FREE, THYROIDAB in the last 72 hours. Anemia Panel: No results for input(s): VITAMINB12, FOLATE, FERRITIN, TIBC, IRON, RETICCTPCT in the last 72 hours. Urine analysis:    Component Value Date/Time   COLORURINE YELLOW 11/27/2016 1400   APPEARANCEUR CLEAR 11/27/2016 1400   APPEARANCEUR Clear 09/10/2016 1114   LABSPEC 1.010 11/27/2016 1400   PHURINE 6.0 11/27/2016 1400  GLUCOSEU NEGATIVE 11/27/2016 1400   HGBUR NEGATIVE 11/27/2016 1400   BILIRUBINUR NEGATIVE 11/27/2016 1400   BILIRUBINUR Negative 09/10/2016 1114   KETONESUR NEGATIVE 11/27/2016 1400   PROTEINUR NEGATIVE 11/27/2016 1400   UROBILINOGEN 0.2 10/14/2011 1132   NITRITE NEGATIVE 11/27/2016 1400   LEUKOCYTESUR NEGATIVE 11/27/2016 1400   LEUKOCYTESUR Negative 09/10/2016 1114   Sepsis Labs: @LABRCNTIP (procalcitonin:4,lacticidven:4)  ) Recent Results (from the past 240 hour(s))  Culture, blood (routine x 2) Call MD if unable to obtain prior to antibiotics being given     Status: None   Collection Time: 01/20/2017  7:28 AM  Result Value Ref Range Status   Specimen Description BLOOD RIGHT ARM  Final   Special Requests   Final    BOTTLES DRAWN AEROBIC AND ANAEROBIC Blood Culture adequate volume   Culture NO GROWTH 5 DAYS  Final   Report Status 01/19/2017 FINAL  Final  Culture, blood (routine x 2) Call MD if unable to obtain prior to antibiotics being given     Status: None   Collection Time: 01/11/2017  7:33 AM  Result Value Ref Range Status   Specimen Description BLOOD RIGHT WRIST  Final   Special Requests   Final    BOTTLES DRAWN AEROBIC AND ANAEROBIC Blood Culture adequate volume   Culture NO GROWTH 5 DAYS  Final    Report Status 01/19/2017 FINAL  Final         Radiology Studies: No results found.      Scheduled Meds: . amiodarone  200 mg Oral Daily  . aspirin EC  81 mg Oral Daily  . atorvastatin  40 mg Oral q1800  . busPIRone  15 mg Oral BID  . DULoxetine  60 mg Oral Daily  . gabapentin  100 mg Oral QHS  . guaiFENesin  600 mg Oral BID  . ipratropium  0.5 mg Nebulization Q6H WA  . levalbuterol  1.25 mg Nebulization Q6H WA  . magic mouthwash  5 mL Oral TID PC & HS  . methylPREDNISolone (SOLU-MEDROL) injection  40 mg Intravenous Q12H  . pantoprazole  40 mg Oral Daily  . potassium chloride  20 mEq Oral BID  . Warfarin - Pharmacist Dosing Inpatient   Does not apply q1800   Continuous Infusions: . ceFEPime (MAXIPIME) IV Stopped (01/19/17 0704)  . vancomycin Stopped (01/19/17 1910)     LOS: 3 days    Time spent: 35 minutes . Greater than 50% of this time was spent in direct contact with the patient coordinating care.     Lelon Frohlich, MD [facility] Pager [phone]  If 7PM-7AM, please contact night-coverage [email or web address] Password [email or web address] 01/19/2017, 6:16 PM

## 2017-01-19 NOTE — Progress Notes (Signed)
Subjective: She says she felt a little better last night. She has multiple family members at bedside so I discussed her situation with them. She has a complicated situation with COPD, lung cancer status post treatment, some element of heart failure and severe hypoxia. She is still hypoxic now on high flow nasal cannula at 10 L. She's not coughing anything up.  Objective: Vital signs in last 24 hours: Temp:  [97.9 F (36.6 C)-98.1 F (36.7 C)] 97.9 F (36.6 C) (09/19 8453) Pulse Rate:  [84-88] 85 (09/19 0614) Resp:  [16-20] 20 (09/19 0614) BP: (100-131)/(60-83) 131/60 (09/19 0614) SpO2:  [80 %-99 %] 91 % (09/19 0813) Weight:  [64 kg (141 lb 1.6 oz)] 64 kg (141 lb 1.6 oz) (09/19 0614) Weight change: 0.602 kg (1 lb 5.3 oz) Last BM Date: 01/12/17  Intake/Output from previous day: 09/18 0701 - 09/19 0700 In: 440 [P.O.:240; IV Piggyback:200] Out: 1700 [Urine:1700]  PHYSICAL EXAM General appearance: alert, cooperative and mild distress Resp: rhonchi bilaterally Cardio: irregularly irregular rhythm GI: soft, non-tender; bowel sounds normal; no masses,  no organomegaly Extremities: extremities normal, atraumatic, no cyanosis or edema Skin warm and dry  Lab Results:  Results for orders placed or performed during the hospital encounter of 01/16/2017 (from the past 48 hour(s))  Brain natriuretic peptide     Status: None   Collection Time: 01/17/17  9:13 AM  Result Value Ref Range   B Natriuretic Peptide 84.0 0.0 - 100.0 pg/mL  Protime-INR     Status: Abnormal   Collection Time: 01/18/17  6:19 AM  Result Value Ref Range   Prothrombin Time 29.0 (H) 11.4 - 15.2 seconds   INR 6.46   Basic metabolic panel     Status: Abnormal   Collection Time: 01/18/17  6:19 AM  Result Value Ref Range   Sodium 136 135 - 145 mmol/L   Potassium 4.1 3.5 - 5.1 mmol/L   Chloride 100 (L) 101 - 111 mmol/L   CO2 28 22 - 32 mmol/L   Glucose, Bld 103 (H) 65 - 99 mg/dL   BUN 23 (H) 6 - 20 mg/dL   Creatinine,  Ser 0.84 0.44 - 1.00 mg/dL   Calcium 8.9 8.9 - 10.3 mg/dL   GFR calc non Af Amer >60 >60 mL/min   GFR calc Af Amer >60 >60 mL/min    Comment: (NOTE) The eGFR has been calculated using the CKD EPI equation. This calculation has not been validated in all clinical situations. eGFR's persistently <60 mL/min signify possible Chronic Kidney Disease.    Anion gap 8 5 - 15  Glucose, capillary     Status: Abnormal   Collection Time: 01/18/17 11:43 AM  Result Value Ref Range   Glucose-Capillary 123 (H) 65 - 99 mg/dL   Comment 1 Notify RN    Comment 2 Document in Chart   Troponin I (q 6hr x 3)     Status: None   Collection Time: 01/18/17 11:51 AM  Result Value Ref Range   Troponin I <0.03 <0.03 ng/mL  Troponin I (q 6hr x 3)     Status: None   Collection Time: 01/18/17  7:46 PM  Result Value Ref Range   Troponin I <0.03 <0.03 ng/mL  Troponin I (q 6hr x 3)     Status: None   Collection Time: 01/19/17  2:34 AM  Result Value Ref Range   Troponin I <0.03 <0.03 ng/mL  Protime-INR     Status: Abnormal   Collection Time: 01/19/17  2:34 AM  Result Value Ref Range   Prothrombin Time 26.5 (H) 11.4 - 15.2 seconds   INR 2.46   Procalcitonin     Status: None   Collection Time: 01/19/17  2:34 AM  Result Value Ref Range   Procalcitonin 0.26 ng/mL    Comment:        Interpretation: PCT (Procalcitonin) <= 0.5 ng/mL: Systemic infection (sepsis) is not likely. Local bacterial infection is possible. (NOTE)         ICU PCT Algorithm               Non ICU PCT Algorithm    ----------------------------     ------------------------------         PCT < 0.25 ng/mL                 PCT < 0.1 ng/mL     Stopping of antibiotics            Stopping of antibiotics       strongly encouraged.               strongly encouraged.    ----------------------------     ------------------------------       PCT level decrease by               PCT < 0.25 ng/mL       >= 80% from peak PCT       OR PCT 0.25 - 0.5 ng/mL           Stopping of antibiotics                                             encouraged.     Stopping of antibiotics           encouraged.    ----------------------------     ------------------------------       PCT level decrease by              PCT >= 0.25 ng/mL       < 80% from peak PCT        AND PCT >= 0.5 ng/mL            Continuin g antibiotics                                              encouraged.       Continuing antibiotics            encouraged.    ----------------------------     ------------------------------     PCT level increase compared          PCT > 0.5 ng/mL         with peak PCT AND          PCT >= 0.5 ng/mL             Escalation of antibiotics                                          strongly encouraged.      Escalation of antibiotics        strongly encouraged.   CBC  Status: Abnormal   Collection Time: 01/19/17  2:34 AM  Result Value Ref Range   WBC 12.3 (H) 4.0 - 10.5 K/uL   RBC 2.72 (L) 3.87 - 5.11 MIL/uL   Hemoglobin 8.0 (L) 12.0 - 15.0 g/dL   HCT 24.3 (L) 36.0 - 46.0 %   MCV 89.3 78.0 - 100.0 fL   MCH 29.4 26.0 - 34.0 pg   MCHC 32.9 30.0 - 36.0 g/dL   RDW 16.7 (H) 11.5 - 15.5 %   Platelets 510 (H) 150 - 400 K/uL  Basic metabolic panel     Status: Abnormal   Collection Time: 01/19/17  2:34 AM  Result Value Ref Range   Sodium 135 135 - 145 mmol/L   Potassium 4.4 3.5 - 5.1 mmol/L   Chloride 102 101 - 111 mmol/L   CO2 27 22 - 32 mmol/L   Glucose, Bld 94 65 - 99 mg/dL   BUN 24 (H) 6 - 20 mg/dL   Creatinine, Ser 0.86 0.44 - 1.00 mg/dL   Calcium 8.8 (L) 8.9 - 10.3 mg/dL   GFR calc non Af Amer >60 >60 mL/min   GFR calc Af Amer >60 >60 mL/min    Comment: (NOTE) The eGFR has been calculated using the CKD EPI equation. This calculation has not been validated in all clinical situations. eGFR's persistently <60 mL/min signify possible Chronic Kidney Disease.    Anion gap 6 5 - 15    ABGS No results for input(s): PHART, PO2ART, TCO2, HCO3 in the  last 72 hours.  Invalid input(s): PCO2 CULTURES Recent Results (from the past 240 hour(s))  Culture, blood (routine x 2) Call MD if unable to obtain prior to antibiotics being given     Status: None   Collection Time: 01/29/2017  7:28 AM  Result Value Ref Range Status   Specimen Description BLOOD RIGHT ARM  Final   Special Requests   Final    BOTTLES DRAWN AEROBIC AND ANAEROBIC Blood Culture adequate volume   Culture NO GROWTH 5 DAYS  Final   Report Status 01/19/2017 FINAL  Final  Culture, blood (routine x 2) Call MD if unable to obtain prior to antibiotics being given     Status: None   Collection Time: 01/21/2017  7:33 AM  Result Value Ref Range Status   Specimen Description BLOOD RIGHT WRIST  Final   Special Requests   Final    BOTTLES DRAWN AEROBIC AND ANAEROBIC Blood Culture adequate volume   Culture NO GROWTH 5 DAYS  Final   Report Status 01/19/2017 FINAL  Final   Studies/Results: No results found.  Medications:  Prior to Admission:  Prescriptions Prior to Admission  Medication Sig Dispense Refill Last Dose  . acetaminophen (TYLENOL) 325 MG tablet Take 2 tablets (650 mg total) by mouth every 4 (four) hours as needed for mild pain, moderate pain, fever or headache. 100 tablet 1 Past Week at Unknown time  . amiodarone (PACERONE) 200 MG tablet Take 200 mg by mouth daily.    01/13/2017 at Unknown time  . aspirin EC 81 MG tablet Take 81 mg by mouth daily.   01/13/2017 at Unknown time  . atorvastatin (LIPITOR) 40 MG tablet TAKE 1 TABLET (40 MG TOTAL) BY MOUTH DAILY. FOR HYPERLIPIDEMIA 90 tablet 1 01/13/2017 at Unknown time  . busPIRone (BUSPAR) 15 MG tablet Take 1 tablet (15 mg total) by mouth 2 (two) times daily. 180 tablet 2 01/13/2017 at Unknown time  . DULoxetine (CYMBALTA) 60 MG capsule Take 1  capsule (60 mg total) by mouth daily. 30 capsule 2 01/13/2017 at Unknown time  . fish oil-omega-3 fatty acids 1000 MG capsule Take 3 capsules (3 g total) by mouth daily. For hyperlipidemia.  (Patient taking differently: Take 1 g by mouth 3 (three) times daily. For hyperlipidemia.) 30 capsule  01/13/2017 at Unknown time  . fluticasone (FLONASE) 50 MCG/ACT nasal spray Place 2 sprays into both nostrils 2 (two) times daily. Use as needed (Patient taking differently: Place 2 sprays into both nostrils 2 (two) times daily as needed for allergies. Use as needed) 16 g 6 unknown  . gabapentin (NEURONTIN) 100 MG capsule Take 1 capsule (100 mg total) by mouth at bedtime. 90 capsule 2 01/13/2017 at Unknown time  . nitroGLYCERIN (NITROSTAT) 0.4 MG SL tablet Place 0.4 mg under the tongue.   unknown  . omeprazole (PRILOSEC) 40 MG capsule Take 1 capsule (40 mg total) by mouth daily. 30 capsule 2 01/13/2017 at Unknown time  . warfarin (COUMADIN) 2.5 MG tablet Take 3 mg by mouth daily.    01/13/2017 at 0730  . [DISCONTINUED] HYDROcodone-homatropine (HYCODAN) 5-1.5 MG/5ML syrup Take 5 mLs by mouth every 6 (six) hours as needed for cough.   01/13/2017 at Unknown time   Scheduled: . amiodarone  200 mg Oral Daily  . aspirin EC  81 mg Oral Daily  . atorvastatin  40 mg Oral q1800  . busPIRone  15 mg Oral BID  . DULoxetine  60 mg Oral Daily  . gabapentin  100 mg Oral QHS  . guaiFENesin  600 mg Oral BID  . ipratropium  0.5 mg Nebulization Q6H WA  . levalbuterol  1.25 mg Nebulization Q6H WA  . magic mouthwash  5 mL Oral TID PC & HS  . pantoprazole  40 mg Oral Daily  . potassium chloride  20 mEq Oral BID  . predniSONE  40 mg Oral Q breakfast  . Warfarin - Pharmacist Dosing Inpatient   Does not apply q1800   Continuous: . ceFEPime (MAXIPIME) IV Stopped (01/19/17 0704)  . vancomycin Stopped (01/19/17 0734)   ZWC:HENIDPOEU, hydrALAZINE, nitroGLYCERIN  Assesment:She was admitted with healthcare associated pneumonia. At baseline she has had lung cancer status post treatment. There appear to be some element of heart failure which has been treated. She is still fairly markedly hypoxic. She's not coughing much up.  She is on antibiotics. She has some element of COPD at baseline with an extensive smoking history. Principal Problem:   Hypoxia Active Problems:   Depression   Essential hypertension, benign   Hyperlipidemia   Atrial fibrillation (HCC) [I48.91]   HCAP (healthcare-associated pneumonia)   Hypokalemia   Bronchospasm    Plan: Continue treatments. Continue trying to wean oxygen. I encouraged her to use the incentive spirometer. I encouraged her to use the flutter valve. She will continue on steroids and I'm going to switch her to IV steroids rather than prednisone since she's been so slow to respond    LOS: 3 days   Daniya Aramburo L 01/19/2017, 8:49 AM

## 2017-01-19 NOTE — Progress Notes (Signed)
ANTICOAGULATION CONSULT NOTE -follow up  Pharmacy Consult for Coumadin Indication: atrial fibrillation  No Known Allergies  Patient Measurements: Height: 5\' 3"  (160 cm) Weight: 141 lb 1.6 oz (64 kg) IBW/kg (Calculated) : 52.4   Vital Signs: Temp: 97.9 F (36.6 C) (09/19 0614) Temp Source: Oral (09/19 0614) BP: 131/60 (09/19 0614) Pulse Rate: 85 (09/19 0614)  Labs:  Recent Labs  01/17/17 0624 01/18/17 0619 01/18/17 1151 01/18/17 1946 01/19/17 0234  HGB  --   --   --   --  8.0*  HCT  --   --   --   --  24.3*  PLT  --   --   --   --  510*  LABPROT 32.0* 29.0*  --   --  26.5*  INR 3.14 2.77  --   --  2.46  CREATININE 0.86 0.84  --   --  0.86  TROPONINI  --   --  <0.03 <0.03 <0.03    Estimated Creatinine Clearance: 46.9 mL/min (by C-G formula based on SCr of 0.86 mg/dL).   Medical History: Past Medical History:  Diagnosis Date  . A-fib ([place])   . Alcohol abuse, in remission   . Anxiety   . Atrial fibrillation ([place])   . Depression   . Dyspnea    walking distances   . Dysrhythmia   . History of bronchitis   . HTN (hypertension)   . Mass of right lung 07/13/2016  . Osteopenia   . Squamous cell carcinoma of right lung ([place]) 07/13/2016    Medications:  Prescriptions Prior to Admission  Medication Sig Dispense Refill Last Dose  . acetaminophen (TYLENOL) 325 MG tablet Take 2 tablets (650 mg total) by mouth every 4 (four) hours as needed for mild pain, moderate pain, fever or headache. 100 tablet 1 Past Week at Unknown time  . amiodarone (PACERONE) 200 MG tablet Take 200 mg by mouth daily.    01/13/2017 at Unknown time  . aspirin EC 81 MG tablet Take 81 mg by mouth daily.   01/13/2017 at Unknown time  . atorvastatin (LIPITOR) 40 MG tablet TAKE 1 TABLET (40 MG TOTAL) BY MOUTH DAILY. FOR HYPERLIPIDEMIA 90 tablet 1 01/13/2017 at Unknown time  . busPIRone (BUSPAR) 15 MG tablet Take 1 tablet (15 mg total) by mouth 2 (two) times daily. 180 tablet 2 01/13/2017 at Unknown time   . DULoxetine (CYMBALTA) 60 MG capsule Take 1 capsule (60 mg total) by mouth daily. 30 capsule 2 01/13/2017 at Unknown time  . fish oil-omega-3 fatty acids 1000 MG capsule Take 3 capsules (3 g total) by mouth daily. For hyperlipidemia. (Patient taking differently: Take 1 g by mouth 3 (three) times daily. For hyperlipidemia.) 30 capsule  01/13/2017 at Unknown time  . fluticasone (FLONASE) 50 MCG/ACT nasal spray Place 2 sprays into both nostrils 2 (two) times daily. Use as needed (Patient taking differently: Place 2 sprays into both nostrils 2 (two) times daily as needed for allergies. Use as needed) 16 g 6 unknown  . gabapentin (NEURONTIN) 100 MG capsule Take 1 capsule (100 mg total) by mouth at bedtime. 90 capsule 2 01/13/2017 at Unknown time  . nitroGLYCERIN (NITROSTAT) 0.4 MG SL tablet Place 0.4 mg under the tongue.   unknown  . omeprazole (PRILOSEC) 40 MG capsule Take 1 capsule (40 mg total) by mouth daily. 30 capsule 2 01/13/2017 at Unknown time  . warfarin (COUMADIN) 2.5 MG tablet Take 3 mg by mouth daily.    01/13/2017 at 0730  . [  DISCONTINUED] HYDROcodone-homatropine (HYCODAN) 5-1.5 MG/5ML syrup Take 5 mLs by mouth every 6 (six) hours as needed for cough.   01/13/2017 at Unknown time    Assessment: 82 y.o. female  with past medical history of atrial fibrillation,   Pharmacy asked to dose Coumadin. Patient is followed by anticoag clinic and takes 3mg  on Sunday and Thursday  And 1.5mg  On Monday, Tuesday, Wednesday, Friday and Saturday. Patient's INR has now trended down to therapeutic level, INR remains therapeutic this morning.  Goal of Therapy:  INR 2-3 Monitor platelets by anticoagulation protocol: Yes   Plan:  Repeat Coumadin 2.5mg  today x 1  PT-INR daily Monitor for S/S of bleeding  Jennilee, Demar[place], [facility]  01/19/2017,9:14 AM

## 2017-01-19 NOTE — Progress Notes (Signed)
Physical Therapy Treatment Patient Details Name: Alexandria Chen MRN: [id number] DOB: Feb 01, 1935 Today's Date: 01/19/2017    History of Present Illness Alexandria Chen is a 82 y.o. female  with past medical history of atrial fibrillation anxiety depression hypertension and lung mass who recently finished chemotherapy who presents with shortness of breath. Patient was recently treated for lung infection in Aug. Comes in today due to continued weakness. Slightly decreased by mouth intake. Was admitted inpatient rehabilitation. Daughter alarmed because patient was diaphoretic and had difficulty just ambulating across the room. Rescue squad activated the patient found to be hypoxic. Brought to the emergency room for evaluation.    PT Comments    Patient limited to bedside activity due to on 10 LPM O2, after completing BLE ROM exercises seated at bedside O2 sats dropped to 89%, recovered to greater than 90%, after standing marching in place O2 sats dropped to 84% and patient put back to bed with no visible distress and family member at bedside.  Patient will benefit from continued physical therapy in hospital and recommended venue below to increase strength, balance, endurance for safe ADLs and gait.    Follow Up Recommendations  Home health PT;Supervision/Assistance - 24 hour;Supervision for mobility/OOB     Equipment Recommendations  None recommended by PT    Recommendations for Other Services       Precautions / Restrictions Precautions Precautions: Fall Precaution Comments: frequent falls, syncopy secondary to BP dropping Restrictions Weight Bearing Restrictions: No    Mobility  Bed Mobility Overal bed mobility: Needs Assistance Bed Mobility: Supine to Sit;Sit to Supine     Supine to sit: Min guard Sit to supine: Min guard      Transfers Overall transfer level: Needs assistance Equipment used: Rolling walker (2 wheeled) Transfers: Sit to/from Stand Sit to Stand: Min guard             Ambulation/Gait Ambulation/Gait assistance: Museum/gallery curator (Feet): 20 Feet Assistive device: Rolling walker (2 wheeled)     Gait velocity interpretation: Below normal speed for age/gender General Gait Details: Patient tolerated marching in place at bedside for approx 20 steps for about 3-4 minutes before fatiguing    Stairs            Wheelchair Mobility    Modified Rankin (Stroke Patients Only)       Balance Overall balance assessment: Needs assistance Sitting-balance support: Bilateral upper extremity supported;Feet supported Sitting balance-Leahy Scale: Good     Standing balance support: Bilateral upper extremity supported;During functional activity Standing balance-Leahy Scale: Fair                              Cognition Arousal/Alertness: Awake/alert Behavior During Therapy: WFL for tasks assessed/performed Overall Cognitive Status: Within Functional Limits for tasks assessed                                        Exercises General Exercises - Lower Extremity Ankle Circles/Pumps: Seated;AROM;Both;10 reps Long Arc Quad: Seated;AROM;Both;10 reps Hip Flexion/Marching: Seated;AROM;Both;10 reps    General Comments        Pertinent Vitals/Pain Pain Assessment: No/denies pain    Home Living                      Prior Function  PT Goals (current goals can now be found in the care plan section) Acute Rehab PT Goals Patient Stated Goal: to go home PT Goal Formulation: With patient/family Time For Goal Achievement: 01/22/17 Potential to Achieve Goals: Good Progress towards PT goals: Progressing toward goals    Frequency    Min 3X/week      PT Plan Current plan remains appropriate    Co-evaluation              AM-PAC PT "6 Clicks" Daily Activity  Outcome Measure  Difficulty turning over in bed (including adjusting bedclothes, sheets and blankets)?:  Unable Difficulty moving from lying on back to sitting on the side of the bed? : Unable Difficulty sitting down on and standing up from a chair with arms (e.g., wheelchair, bedside commode, etc,.)?: Unable Help needed moving to and from a bed to chair (including a wheelchair)?: A Little Help needed walking in hospital room?: A Little Help needed climbing 3-5 steps with a railing? : A Little 6 Click Score: 12    End of Session Equipment Utilized During Treatment: Oxygen (on 10 LPM O2) Activity Tolerance: Patient tolerated treatment well;Patient limited by fatigue Patient left: in bed;with call bell/phone within reach;with family/visitor present Nurse Communication: Mobility status PT Visit Diagnosis: Unsteadiness on feet (R26.81);Other abnormalities of gait and mobility (R26.89);Muscle weakness (generalized) (M62.81)     Time: 7062-3762 PT Time Calculation (min) (ACUTE ONLY): 31 min  Charges:  $Therapeutic Activity: 23-37 mins                    G Codes:       4:10 PM, 2017-02-13 Lonell Grandchild, MPT Physical Therapist with [facility] [phone] office [phone] mobile phone

## 2017-01-20 LAB — CBC
HCT: 28.2 % — ABNORMAL LOW (ref 36.0–46.0)
HEMOGLOBIN: 9.2 g/dL — AB (ref 12.0–15.0)
MCH: 29.5 pg (ref 26.0–34.0)
MCHC: 32.6 g/dL (ref 30.0–36.0)
MCV: 90.4 fL (ref 78.0–100.0)
PLATELETS: 498 10*3/uL — AB (ref 150–400)
RBC: 3.12 MIL/uL — ABNORMAL LOW (ref 3.87–5.11)
RDW: 16.6 % — ABNORMAL HIGH (ref 11.5–15.5)
WBC: 11.8 10*3/uL — ABNORMAL HIGH (ref 4.0–10.5)

## 2017-01-20 LAB — BASIC METABOLIC PANEL
ANION GAP: 8 (ref 5–15)
BUN: 23 mg/dL — ABNORMAL HIGH (ref 6–20)
CALCIUM: 9.3 mg/dL (ref 8.9–10.3)
CO2: 28 mmol/L (ref 22–32)
Chloride: 102 mmol/L (ref 101–111)
Creatinine, Ser: 0.8 mg/dL (ref 0.44–1.00)
GLUCOSE: 134 mg/dL — AB (ref 65–99)
Potassium: 5.5 mmol/L — ABNORMAL HIGH (ref 3.5–5.1)
Sodium: 138 mmol/L (ref 135–145)

## 2017-01-20 LAB — PROTIME-INR
INR: 2.29
Prothrombin Time: 25 seconds — ABNORMAL HIGH (ref 11.4–15.2)

## 2017-01-20 LAB — VANCOMYCIN, TROUGH: VANCOMYCIN TR: 6 ug/mL — AB (ref 15–20)

## 2017-01-20 MED ORDER — VANCOMYCIN HCL IN DEXTROSE 750-5 MG/150ML-% IV SOLN
750.0000 mg | Freq: Two times a day (BID) | INTRAVENOUS | Status: DC
  Administered 2017-01-20 – 2017-01-22 (×5): 750 mg via INTRAVENOUS
  Filled 2017-01-20 (×9): qty 150

## 2017-01-20 MED ORDER — WARFARIN SODIUM 1 MG PO TABS
3.0000 mg | ORAL_TABLET | Freq: Once | ORAL | Status: AC
  Administered 2017-01-20: 17:00:00 3 mg via ORAL
  Filled 2017-01-20: qty 1

## 2017-01-20 NOTE — Progress Notes (Signed)
Physical Therapy Treatment Patient Details Name: Alexandria Chen MRN: [id number] DOB: 19-Nov-1934 Today's Date: 01/20/2017    History of Present Illness Alexandria Chen is a 82 y.o. female  with past medical history of atrial fibrillation anxiety depression hypertension and lung mass who recently finished chemotherapy who presents with shortness of breath. Patient was recently treated for lung infection in Aug. Comes in today due to continued weakness. Slightly decreased by mouth intake. Was admitted inpatient rehabilitation. Daughter alarmed because patient was diaphoretic and had difficulty just ambulating across the room. Rescue squad activated the patient found to be hypoxic. Brought to the emergency room for evaluation.    PT Comments    Patient required frequent rest breaks due to SOB with O2 sats dropping below 80% while on 10 LPM, demonstrated increased endurance for taking steps at bedside, but limited due to SOB and put back to bed.  Patient will benefit from continued physical therapy in hospital and recommended venue below to increase strength, balance, endurance for safe ADLs and gait.    Follow Up Recommendations  Home health PT;Supervision/Assistance - 24 hour;Supervision for mobility/OOB     Equipment Recommendations  None recommended by PT    Recommendations for Other Services       Precautions / Restrictions Precautions Precautions: Fall Precaution Comments: frequent falls, syncopy secondary to BP dropping Restrictions Weight Bearing Restrictions: No Other Position/Activity Restrictions: monitor O2 sats    Mobility  Bed Mobility Overal bed mobility: Needs Assistance Bed Mobility: Supine to Sit;Sit to Supine     Supine to sit: Min guard Sit to supine: Min guard;Supervision   General bed mobility comments: Demonstrates good return for using BUE/LE for moving up in bed  Transfers Overall transfer level: Needs assistance Equipment used: Rolling walker (2  wheeled) Transfers: Sit to/from Stand Sit to Stand: Min guard            Ambulation/Gait Ambulation/Gait assistance: Museum/gallery curator (Feet): 12 Feet Assistive device: Rolling walker (2 wheeled) Gait Pattern/deviations: Decreased stride length;Decreased step length - right;Decreased step length - left   Gait velocity interpretation: Below normal speed for age/gender General Gait Details: Patient demonstrates slow labored unsteady steps without loss of balance taking steps away from bedside   Stairs            Wheelchair Mobility    Modified Rankin (Stroke Patients Only)       Balance Overall balance assessment: Needs assistance Sitting-balance support: Bilateral upper extremity supported;Feet supported Sitting balance-Leahy Scale: Good     Standing balance support: Bilateral upper extremity supported;During functional activity Standing balance-Leahy Scale: Fair                              Cognition Arousal/Alertness: Awake/alert Behavior During Therapy: WFL for tasks assessed/performed Overall Cognitive Status: Within Functional Limits for tasks assessed                                        Exercises General Exercises - Lower Extremity Ankle Circles/Pumps: Seated;AROM;Both;10 reps Long Arc Quad: Seated;AROM;Both;10 reps Hip Flexion/Marching: Seated;AROM;Both;10 reps    General Comments        Pertinent Vitals/Pain Pain Assessment: No/denies pain    Home Living                      Prior Function  PT Goals (current goals can now be found in the care plan section) Acute Rehab PT Goals Patient Stated Goal: to go home PT Goal Formulation: With patient/family Time For Goal Achievement: 01/22/17 Potential to Achieve Goals: Good Progress towards PT goals: Progressing toward goals    Frequency    Min 3X/week      PT Plan Current plan remains appropriate    Co-evaluation               AM-PAC PT "6 Clicks" Daily Activity  Outcome Measure  Difficulty turning over in bed (including adjusting bedclothes, sheets and blankets)?: None Difficulty moving from lying on back to sitting on the side of the bed? : Unable Difficulty sitting down on and standing up from a chair with arms (e.g., wheelchair, bedside commode, etc,.)?: Unable Help needed moving to and from a bed to chair (including a wheelchair)?: A Little Help needed walking in hospital room?: A Little Help needed climbing 3-5 steps with a railing? : A Little 6 Click Score: 15    End of Session Equipment Utilized During Treatment: Gait belt;Oxygen (on 10 LPM O2) Activity Tolerance: Patient tolerated treatment well;Patient limited by fatigue (limited secondary to SOB) Patient left: in bed;with call bell/phone within reach;with family/visitor present Nurse Communication: Mobility status PT Visit Diagnosis: Unsteadiness on feet (R26.81);Other abnormalities of gait and mobility (R26.89);Muscle weakness (generalized) (M62.81)     Time: 5643-3295 PT Time Calculation (min) (ACUTE ONLY): 33 min  Charges:  $Therapeutic Activity: 23-37 mins                    G Codes:      1:49 PM, January 23, 2017 Lonell Grandchild, MPT Physical Therapist with [facility] [phone] office [phone] mobile phone

## 2017-01-20 NOTE — Care Management Note (Signed)
Case Management Note  Patient Details  Name: Alexandria Chen MRN: [id number] Date of Birth: 1935-04-12  If discussed at [place] Length of Stay Meetings, dates discussed:  01/20/2017   Sherald Barge, RN 01/20/2017, 2:12 PM

## 2017-01-20 NOTE — Progress Notes (Signed)
PROGRESS NOTE    Alexandria Chen  EXN:[id number] DOB: June 01, 1934 DOA: 01/03/2017 PCP: [name], FNP     Brief Narrative:  82 year old woman admitted from home on 9/14 due to shortness of breath. She has a history of atrial fibrillation, anxiety, depression, hypertension and lung cancer who recently finished chemotherapy. She was recently treated for pneumonia in August. Came in due to continued weakness and difficulty breathing with ambulation. 911 was activated and patient was found to be hypoxic and was brought into the hospital for evaluation.   Assessment & Plan:   Principal Problem:   Hypoxia Active Problems:   Depression   Essential hypertension, benign   Hyperlipidemia   Atrial fibrillation (HCC) [I48.91]   HCAP (healthcare-associated pneumonia)   Hypokalemia   Bronchospasm   Acute hypoxemic respiratory failure -Believed to be multifactorial: Healthcare associated pneumonia, acute diastolic dysfunction, COPD (not formally diagnosed but does have an extensive smoking history and significant wheezing on exam), also history of lung cancer. -Plan to continue antibiotic therapy with vancomycin and cefepime, length of treatment approximately to be 8-10 days. -Continue steroids, nebs, incentive spirometry with flutter valve. I think her lungs sound a little bit better today. -Appreciate Dr. Luan Pulling input and recommendations. -She currently appears euvolemic. Lasix is on hold due to orthostatic hypotension. -She is still requiring a high concentration of oxygen, 9 L high flow.  Depression with anxiety -No suicidal or homicidal ideation. -Continue Cymbalta, BuSpar  Hypertension Stable, no longer hypotensive  Hyperlipidemia -Continue statin   paroxysmal atrial fibrillation -Anticoagulated on Coumadin, currently rate controlled.  GERD -Continue PPI  Coronary artery disease -Stable, no chest pain  Chronic pain syndrome -Continue Neurontin   DVT prophylaxis:  Anticoagulated on Coumadin Code Status: Full Code Family Communication: grandson at bedside updated on plan of care and questions answered  Disposition Plan: Pending decreasing oxygen requirements and clinical improvement.  Consultants:   Pulmonary, Dr. Luan Pulling   Procedures:   None   Antimicrobials:  Anti-infectives    Start     Dose/Rate Route Frequency Ordered Stop   01/20/17 1000  vancomycin (VANCOCIN) IVPB 750 mg/150 ml premix     750 mg 150 mL/hr over 60 Minutes Intravenous Every 12 hours 01/20/17 0925     01/17/17 1800  vancomycin (VANCOCIN) 500 mg in sodium chloride 0.9 % 100 mL IVPB  Status:  Discontinued     500 mg 100 mL/hr over 60 Minutes Intravenous Every 12 hours 01/17/17 0934 01/20/17 0925   01/17/17 0700  vancomycin (VANCOCIN) IVPB 1000 mg/200 mL premix     1,000 mg 200 mL/hr over 60 Minutes Intravenous  Once 01/17/17 0650 01/17/17 0800   01/16/17 0000  cefpodoxime (VANTIN) 200 MG tablet     200 mg Oral 2 times daily 01/16/17 0841 01/23/17 2359   01/15/17 0600  ceFEPIme (MAXIPIME) 1 g in dextrose 5 % 50 mL IVPB     1 g 100 mL/hr over 30 Minutes Intravenous Every 24 hours 01/23/2017 0819     01/21/2017 0530  cefTRIAXone (ROCEPHIN) 1 g in dextrose 5 % 50 mL IVPB  Status:  Discontinued     1 g 100 mL/hr over 30 Minutes Intravenous  Once 01/28/2017 0518 01/06/2017 0526   01/21/2017 0530  ceFEPIme (MAXIPIME) 2 g in dextrose 5 % 50 mL IVPB     2 g 100 mL/hr over 30 Minutes Intravenous  Once 01/26/2017 0526 01/21/2017 4967       Subjective: Walked today with physical therapy, shortness of  breath seems to be improving, denies chest pain.  Objective: Vitals:   01/20/17 0014 01/20/17 0015 01/20/17 0534 01/20/17 0834  BP:   135/74   Pulse:   72   Resp:   18   Temp:   97.9 F (36.6 C)   TempSrc:   Oral   SpO2: (!) 84% 95% 100% 90%  Weight:   60.1 kg (132 lb 8 oz)   Height:        Intake/Output Summary (Last 24 hours) at 01/20/17 1511 Last data filed at 01/20/17 0800   Gross per 24 hour  Intake              360 ml  Output             1225 ml  Net             -865 ml   Filed Weights   01/18/17 0613 01/19/17 0614 01/20/17 0534  Weight: 63.4 kg (139 lb 12.4 oz) 64 kg (141 lb 1.6 oz) 60.1 kg (132 lb 8 oz)    Examination:  General exam: Alert, awake, oriented x 3 Respiratory system: Minimal bilateral wheezing, no rhonchi or crackles Cardiovascular system:RRR. No murmurs, rubs, gallops. Gastrointestinal system: Abdomen is nondistended, soft and nontender. No organomegaly or masses felt. Normal bowel sounds heard. Central nervous system: Alert and oriented. No focal neurological deficits. Extremities: No C/C/E, +pedal pulses Skin: No rashes, lesions or ulcers Psychiatry: Judgement and insight appear normal. Mood & affect appropriate.      Data Reviewed: I have personally reviewed following labs and imaging studies  CBC:  Recent Labs Lab 01/10/2017 0403 01/15/17 0644 01/16/17 0618 01/19/17 0234 01/20/17 0649  WBC 9.5 13.9* 10.9* 12.3* 11.8*  NEUTROABS 7.2 12.1*  --   --   --   HGB 9.2* 8.5* 8.8* 8.0* 9.2*  HCT 28.4* 25.9* 26.8* 24.3* 28.2*  MCV 89.9 90.2 91.2 89.3 90.4  PLT 417* 393 458* 510* 876*   Basic Metabolic Panel:  Recent Labs Lab 01/15/17 0644 01/17/17 0624 01/18/17 0619 01/19/17 0234 01/20/17 0649  NA 141 138 136 135 138  K 5.0 4.3 4.1 4.4 5.5*  CL 110 102 100* 102 102  CO2 24 28 28 27 28   GLUCOSE 147* 103* 103* 94 134*  BUN 19 20 23* 24* 23*  CREATININE 0.71 0.86 0.84 0.86 0.80  CALCIUM 9.1 9.1 8.9 8.8* 9.3   GFR: Estimated Creatinine Clearance: 46.4 mL/min (by C-G formula based on SCr of 0.8 mg/dL). Liver Function Tests:  Recent Labs Lab 01/17/17 0624  AST 32  ALT 36  ALKPHOS 70  BILITOT 0.5  PROT 6.7  ALBUMIN 2.3*   No results for input(s): LIPASE, AMYLASE in the last 168 hours. No results for input(s): AMMONIA in the last 168 hours. Coagulation Profile:  Recent Labs Lab 01/16/17 0618 01/17/17 0624  01/18/17 0619 01/19/17 0234 01/20/17 0649  INR 4.28* 3.14 2.77 2.46 2.29   Cardiac Enzymes:  Recent Labs Lab 01/18/17 1151 01/18/17 1946 01/19/17 0234  TROPONINI <0.03 <0.03 <0.03   BNP (last 3 results) No results for input(s): PROBNP in the last 8760 hours. HbA1C: No results for input(s): HGBA1C in the last 72 hours. CBG:  Recent Labs Lab 01/18/17 1143  GLUCAP 123*   Lipid Profile: No results for input(s): CHOL, HDL, LDLCALC, TRIG, CHOLHDL, LDLDIRECT in the last 72 hours. Thyroid Function Tests: No results for input(s): TSH, T4TOTAL, FREET4, T3FREE, THYROIDAB in the last 72 hours. Anemia Panel: No results for  input(s): VITAMINB12, FOLATE, FERRITIN, TIBC, IRON, RETICCTPCT in the last 72 hours. Urine analysis:    Component Value Date/Time   COLORURINE YELLOW 11/27/2016 1400   APPEARANCEUR CLEAR 11/27/2016 1400   APPEARANCEUR Clear 09/10/2016 1114   LABSPEC 1.010 11/27/2016 1400   PHURINE 6.0 11/27/2016 1400   GLUCOSEU NEGATIVE 11/27/2016 1400   HGBUR NEGATIVE 11/27/2016 1400   BILIRUBINUR NEGATIVE 11/27/2016 1400   BILIRUBINUR Negative 09/10/2016 1114   KETONESUR NEGATIVE 11/27/2016 1400   PROTEINUR NEGATIVE 11/27/2016 1400   UROBILINOGEN 0.2 10/14/2011 1132   NITRITE NEGATIVE 11/27/2016 1400   LEUKOCYTESUR NEGATIVE 11/27/2016 1400   LEUKOCYTESUR Negative 09/10/2016 1114   Sepsis Labs: @LABRCNTIP (procalcitonin:4,lacticidven:4)  ) Recent Results (from the past 240 hour(s))  Culture, blood (routine x 2) Call MD if unable to obtain prior to antibiotics being given     Status: None   Collection Time: 01/29/2017  7:28 AM  Result Value Ref Range Status   Specimen Description BLOOD RIGHT ARM  Final   Special Requests   Final    BOTTLES DRAWN AEROBIC AND ANAEROBIC Blood Culture adequate volume   Culture NO GROWTH 5 DAYS  Final   Report Status 01/19/2017 FINAL  Final  Culture, blood (routine x 2) Call MD if unable to obtain prior to antibiotics being given      Status: None   Collection Time: 01/17/2017  7:33 AM  Result Value Ref Range Status   Specimen Description BLOOD RIGHT WRIST  Final   Special Requests   Final    BOTTLES DRAWN AEROBIC AND ANAEROBIC Blood Culture adequate volume   Culture NO GROWTH 5 DAYS  Final   Report Status 01/19/2017 FINAL  Final         Radiology Studies: No results found.      Scheduled Meds: . amiodarone  200 mg Oral Daily  . aspirin EC  81 mg Oral Daily  . atorvastatin  40 mg Oral q1800  . busPIRone  15 mg Oral BID  . DULoxetine  60 mg Oral Daily  . gabapentin  100 mg Oral QHS  . guaiFENesin  600 mg Oral BID  . ipratropium  0.5 mg Nebulization Q6H WA  . levalbuterol  1.25 mg Nebulization Q6H WA  . magic mouthwash  5 mL Oral TID PC & HS  . methylPREDNISolone (SOLU-MEDROL) injection  40 mg Intravenous Q12H  . pantoprazole  40 mg Oral Daily  . warfarin  3 mg Oral Once  . Warfarin - Pharmacist Dosing Inpatient   Does not apply q1800   Continuous Infusions: . ceFEPime (MAXIPIME) IV Stopped (01/20/17 0630)  . vancomycin Stopped (01/20/17 1145)     LOS: 4 days    Time spent: 30 minutes . Greater than 50% of this time was spent in direct contact with the patient coordinating care.     Lelon Frohlich, MD [facility] Pager [phone]  If 7PM-7AM, please contact night-coverage [email or web address] Password [email or web address] 01/20/2017, 3:11 PM

## 2017-01-20 NOTE — Progress Notes (Signed)
ANTIBIOTIC CONSULT NOTE  Pharmacy Consult for vancomycin and cefepime Indication: pneumonia  No Known Allergies  Patient Measurements: Height: 5\' 3"  (160 cm) Weight: 132 lb 8 oz (60.1 kg) IBW/kg (Calculated) : 52.4  Vital Signs: Temp: 97.9 F (36.6 C) (09/20 0534) Temp Source: Oral (09/20 0534) BP: 135/74 (09/20 0534) Pulse Rate: 72 (09/20 0534)  Labs:  Recent Labs  01/18/17 0619 01/19/17 0234 01/20/17 0649  WBC  --  12.3* 11.8*  HGB  --  8.0* 9.2*  PLT  --  510* 498*  CREATININE 0.84 0.86 0.80    Estimated Creatinine Clearance: 46.4 mL/min (by C-G formula based on SCr of 0.8 mg/dL).   Recent Labs  01/20/17 0649  [place] 6*     Microbiology: Recent Results (from the past 720 hour(s))  Culture, blood (routine x 2) Call MD if unable to obtain prior to antibiotics being given     Status: None   Collection Time: 01/19/2017  7:28 AM  Result Value Ref Range Status   Specimen Description BLOOD RIGHT ARM  Final   Special Requests   Final    BOTTLES DRAWN AEROBIC AND ANAEROBIC Blood Culture adequate volume   Culture NO GROWTH 5 DAYS  Final   Report Status 01/19/2017 FINAL  Final  Culture, blood (routine x 2) Call MD if unable to obtain prior to antibiotics being given     Status: None   Collection Time: 01/28/2017  7:33 AM  Result Value Ref Range Status   Specimen Description BLOOD RIGHT WRIST  Final   Special Requests   Final    BOTTLES DRAWN AEROBIC AND ANAEROBIC Blood Culture adequate volume   Culture NO GROWTH 5 DAYS  Final   Report Status 01/19/2017 FINAL  Final    Medical History: Past Medical History:  Diagnosis Date  . A-fib ([place])   . Alcohol abuse, in remission   . Anxiety   . Atrial fibrillation ([place])   . Depression   . Dyspnea    walking distances   . Dysrhythmia   . History of bronchitis   . HTN (hypertension)   . Mass of right lung 07/13/2016  . Osteopenia   . Squamous cell carcinoma of right lung ([place]) 07/13/2016    Medications:   Scheduled:  . amiodarone  200 mg Oral Daily  . aspirin EC  81 mg Oral Daily  . atorvastatin  40 mg Oral q1800  . busPIRone  15 mg Oral BID  . DULoxetine  60 mg Oral Daily  . gabapentin  100 mg Oral QHS  . guaiFENesin  600 mg Oral BID  . ipratropium  0.5 mg Nebulization Q6H WA  . levalbuterol  1.25 mg Nebulization Q6H WA  . magic mouthwash  5 mL Oral TID PC & HS  . methylPREDNISolone (SOLU-MEDROL) injection  40 mg Intravenous Q12H  . pantoprazole  40 mg Oral Daily  . potassium chloride  20 mEq Oral BID  . Warfarin - Pharmacist Dosing Inpatient   Does not apply q1800   Infusions:  . ceFEPime (MAXIPIME) IV Stopped (01/20/17 0630)  . vancomycin     PRN: albuterol, hydrALAZINE, nitroGLYCERIN Anti-infectives    Start     Dose/Rate Route Frequency Ordered Stop   01/20/17 1000  vancomycin (VANCOCIN) IVPB 750 mg/150 ml premix     750 mg 150 mL/hr over 60 Minutes Intravenous Every 12 hours 01/20/17 0925     01/17/17 1800  vancomycin (VANCOCIN) 500 mg in sodium chloride 0.9 % 100 mL IVPB  Status:  Discontinued     500 mg 100 mL/hr over 60 Minutes Intravenous Every 12 hours 01/17/17 0934 01/20/17 0925   01/17/17 0700  vancomycin (VANCOCIN) IVPB 1000 mg/200 mL premix     1,000 mg 200 mL/hr over 60 Minutes Intravenous  Once 01/17/17 0650 01/17/17 0800   01/16/17 0000  cefpodoxime (VANTIN) 200 MG tablet     200 mg Oral 2 times daily 01/16/17 0841 01/23/17 2359   01/15/17 0600  ceFEPIme (MAXIPIME) 1 g in dextrose 5 % 50 mL IVPB     1 g 100 mL/hr over 30 Minutes Intravenous Every 24 hours 01/27/2017 0819     01/08/2017 0530  cefTRIAXone (ROCEPHIN) 1 g in dextrose 5 % 50 mL IVPB  Status:  Discontinued     1 g 100 mL/hr over 30 Minutes Intravenous  Once 01/06/2017 0518 01/11/2017 0526   01/04/2017 0530  ceFEPIme (MAXIPIME) 2 g in dextrose 5 % 50 mL IVPB     2 g 100 mL/hr over 30 Minutes Intravenous  Once 01/29/2017 0526 01/20/2017 6568     Assessment: 82 yo female admitted for shortness of breath,  being treated for PNA with cefepime. Trough below goal.  Plan to continue antibiotic therapy with vancomycin and cefepime, length of treatment approximately to be 8-10 days per MD note.   Goal of Therapy:  Vancomycin trough level 15-20 mcg/ml  Plan:  Continue Cefepime 1gm IV q24h Increase Vancomycin to 750 mg IV q12hrs Repeat trough @ steady state if clinically indicated and therapy to continue. F/U cxs and clinical progress  Kerstyn, [place], [facility] 01/20/2017,9:27 AM

## 2017-01-20 NOTE — Progress Notes (Signed)
ANTICOAGULATION CONSULT NOTE  Pharmacy Consult for Coumadin Indication: atrial fibrillation  No Known Allergies  Patient Measurements: Height: 5\' 3"  (160 cm) Weight: 132 lb 8 oz (60.1 kg) IBW/kg (Calculated) : 52.4   Vital Signs: Temp: 97.9 F (36.6 C) (09/20 0534) Temp Source: Oral (09/20 0534) BP: 135/74 (09/20 0534) Pulse Rate: 72 (09/20 0534)  Labs:  Recent Labs  01/18/17 0619 01/18/17 1151 01/18/17 1946 01/19/17 0234 01/20/17 0649  HGB  --   --   --  8.0* 9.2*  HCT  --   --   --  24.3* 28.2*  PLT  --   --   --  510* 498*  LABPROT 29.0*  --   --  26.5* 25.0*  INR 2.77  --   --  2.46 2.29  CREATININE 0.84  --   --  0.86 0.80  TROPONINI  --  <0.03 <0.03 <0.03  --     Estimated Creatinine Clearance: 46.4 mL/min (by C-G formula based on SCr of 0.8 mg/dL).   Medical History: Past Medical History:  Diagnosis Date  . A-fib ([place])   . Alcohol abuse, in remission   . Anxiety   . Atrial fibrillation ([place])   . Depression   . Dyspnea    walking distances   . Dysrhythmia   . History of bronchitis   . HTN (hypertension)   . Mass of right lung 07/13/2016  . Osteopenia   . Squamous cell carcinoma of right lung ([place]) 07/13/2016    Medications:  Prescriptions Prior to Admission  Medication Sig Dispense Refill Last Dose  . acetaminophen (TYLENOL) 325 MG tablet Take 2 tablets (650 mg total) by mouth every 4 (four) hours as needed for mild pain, moderate pain, fever or headache. 100 tablet 1 Past Week at Unknown time  . amiodarone (PACERONE) 200 MG tablet Take 200 mg by mouth daily.    01/13/2017 at Unknown time  . aspirin EC 81 MG tablet Take 81 mg by mouth daily.   01/13/2017 at Unknown time  . atorvastatin (LIPITOR) 40 MG tablet TAKE 1 TABLET (40 MG TOTAL) BY MOUTH DAILY. FOR HYPERLIPIDEMIA 90 tablet 1 01/13/2017 at Unknown time  . busPIRone (BUSPAR) 15 MG tablet Take 1 tablet (15 mg total) by mouth 2 (two) times daily. 180 tablet 2 01/13/2017 at Unknown time  .  DULoxetine (CYMBALTA) 60 MG capsule Take 1 capsule (60 mg total) by mouth daily. 30 capsule 2 01/13/2017 at Unknown time  . fish oil-omega-3 fatty acids 1000 MG capsule Take 3 capsules (3 g total) by mouth daily. For hyperlipidemia. (Patient taking differently: Take 1 g by mouth 3 (three) times daily. For hyperlipidemia.) 30 capsule  01/13/2017 at Unknown time  . fluticasone (FLONASE) 50 MCG/ACT nasal spray Place 2 sprays into both nostrils 2 (two) times daily. Use as needed (Patient taking differently: Place 2 sprays into both nostrils 2 (two) times daily as needed for allergies. Use as needed) 16 g 6 unknown  . gabapentin (NEURONTIN) 100 MG capsule Take 1 capsule (100 mg total) by mouth at bedtime. 90 capsule 2 01/13/2017 at Unknown time  . nitroGLYCERIN (NITROSTAT) 0.4 MG SL tablet Place 0.4 mg under the tongue.   unknown  . omeprazole (PRILOSEC) 40 MG capsule Take 1 capsule (40 mg total) by mouth daily. 30 capsule 2 01/13/2017 at Unknown time  . warfarin (COUMADIN) 2.5 MG tablet Take 3 mg by mouth daily.    01/13/2017 at 0730  . [DISCONTINUED] HYDROcodone-homatropine (HYCODAN) 5-1.5 MG/5ML syrup  Take 5 mLs by mouth every 6 (six) hours as needed for cough.   01/13/2017 at Unknown time    Assessment: 82 y.o. female  with past medical history of atrial fibrillation,   Pharmacy asked to dose Coumadin. Patient is followed by anticoag clinic and takes 3mg  on Sunday and Thursday  And 1.5mg  On Monday, Tuesday, Wednesday, Friday and Saturday. Patient's INR has now trended down to therapeutic level, INR remains therapeutic this morning.  Goal of Therapy:  INR 2-3 Monitor platelets by anticoagulation protocol: Yes   Plan:  Repeat Coumadin 3mg  today x 1  PT-INR daily Monitor for S/S of bleeding  Tynleigh, Birt, RPH  01/20/2017,9:30 AM

## 2017-01-20 NOTE — Progress Notes (Signed)
Subjective: She had an episode of hypoxia in the night and it seems like that may have been related to bronchospasm because she got much better with albuterol treatment. She is currently on 10 Chen of oxygen by high flow nasal cannula and her oxygen saturation is in the 92-93 range. She says she feels okay.  Objective: Vital signs in last 24 hours: Temp:  [97.9 F (36.6 C)-99.4 F (37.4 C)] 97.9 F (36.6 C) (09/20 0534) Pulse Rate:  [72-87] 72 (09/20 0534) Resp:  [18-20] 18 (09/20 0534) BP: (134-135)/(66-74) 135/74 (09/20 0534) SpO2:  [84 %-100 %] 100 % (09/20 0534) Weight:  [60.1 kg (132 lb 8 oz)] 60.1 kg (132 lb 8 oz) (09/20 0534) Weight change: -3.901 kg (-8 lb 9.6 oz) Last BM Date: 01/12/17  Intake/Output from previous day: 09/19 0701 - 09/20 0700 In: 720 [P.O.:720] Out: 1225 [Urine:1225]  PHYSICAL EXAM General appearance: alert, cooperative and mild distress Resp: She still has rhonchi but she is moving air better Cardio: irregularly irregular rhythm GI: soft, non-tender; bowel sounds normal; no masses,  no organomegaly Extremities: extremities normal, atraumatic, no cyanosis or edema Skin warm and dry. She is mildly hard of hearing  Lab Results:  Results for orders placed or performed during the hospital encounter of 01/22/2017 (from the past 48 hour(s))  Glucose, capillary     Status: Abnormal   Collection Time: 01/18/17 11:43 AM  Result Value Ref Range   Glucose-Capillary 123 (H) 65 - 99 mg/dL   Comment 1 Notify RN    Comment 2 Document in Chart   Troponin I (q 6hr x 3)     Status: None   Collection Time: 01/18/17 11:51 AM  Result Value Ref Range   Troponin I <0.03 <0.03 ng/mL  Troponin I (q 6hr x 3)     Status: None   Collection Time: 01/18/17  7:46 PM  Result Value Ref Range   Troponin I <0.03 <0.03 ng/mL  Troponin I (q 6hr x 3)     Status: None   Collection Time: 01/19/17  2:34 AM  Result Value Ref Range   Troponin I <0.03 <0.03 ng/mL  Protime-INR     Status:  Abnormal   Collection Time: 01/19/17  2:34 AM  Result Value Ref Range   Prothrombin Time 26.5 (H) 11.4 - 15.2 seconds   INR 2.46   Procalcitonin     Status: None   Collection Time: 01/19/17  2:34 AM  Result Value Ref Range   Procalcitonin 0.26 ng/mL    Comment:        Interpretation: PCT (Procalcitonin) <= 0.5 ng/mL: Systemic infection (sepsis) is not likely. Local bacterial infection is possible. (NOTE)         ICU PCT Algorithm               Non ICU PCT Algorithm    ----------------------------     ------------------------------         PCT < 0.25 ng/mL                 PCT < 0.1 ng/mL     Stopping of antibiotics            Stopping of antibiotics       strongly encouraged.               strongly encouraged.    ----------------------------     ------------------------------       PCT level decrease by  PCT < 0.25 ng/mL       >= 80% from peak PCT       OR PCT 0.25 - 0.5 ng/mL          Stopping of antibiotics                                             encouraged.     Stopping of antibiotics           encouraged.    ----------------------------     ------------------------------       PCT level decrease by              PCT >= 0.25 ng/mL       < 80% from peak PCT        AND PCT >= 0.5 ng/mL            Continuin g antibiotics                                              encouraged.       Continuing antibiotics            encouraged.    ----------------------------     ------------------------------     PCT level increase compared          PCT > 0.5 ng/mL         with peak PCT AND          PCT >= 0.5 ng/mL             Escalation of antibiotics                                          strongly encouraged.      Escalation of antibiotics        strongly encouraged.   CBC     Status: Abnormal   Collection Time: 01/19/17  2:34 AM  Result Value Ref Range   WBC 12.3 (H) 4.0 - 10.5 K/uL   RBC 2.72 (Chen) 3.87 - 5.11 MIL/uL   Hemoglobin 8.0 (Chen) 12.0 - 15.0 g/dL   HCT  24.3 (Chen) 36.0 - 46.0 %   MCV 89.3 78.0 - 100.0 fL   MCH 29.4 26.0 - 34.0 pg   MCHC 32.9 30.0 - 36.0 g/dL   RDW 16.7 (H) 11.5 - 15.5 %   Platelets 510 (H) 150 - 400 K/uL  Basic metabolic panel     Status: Abnormal   Collection Time: 01/19/17  2:34 AM  Result Value Ref Range   Sodium 135 135 - 145 mmol/Chen   Potassium 4.4 3.5 - 5.1 mmol/Chen   Chloride 102 101 - 111 mmol/Chen   CO2 27 22 - 32 mmol/Chen   Glucose, Bld 94 65 - 99 mg/dL   BUN 24 (H) 6 - 20 mg/dL   Creatinine, Ser 0.86 0.44 - 1.00 mg/dL   Calcium 8.8 (Chen) 8.9 - 10.3 mg/dL   GFR calc non Af Amer >60 >60 mL/min   GFR calc Af Amer >60 >60 mL/min    Comment: (NOTE) The eGFR has been calculated using the CKD EPI equation. This calculation has  not been validated in all clinical situations. eGFR's persistently <60 mL/min signify possible Chronic Kidney Disease.    Anion gap 6 5 - 15  Vancomycin, trough     Status: Abnormal   Collection Time: 01/20/17  6:49 AM  Result Value Ref Range   Vancomycin Tr 6 (Chen) 15 - 20 ug/mL  Basic metabolic panel     Status: Abnormal   Collection Time: 01/20/17  6:49 AM  Result Value Ref Range   Sodium 138 135 - 145 mmol/Chen   Potassium 5.5 (H) 3.5 - 5.1 mmol/Chen    Comment: DELTA CHECK NOTED   Chloride 102 101 - 111 mmol/Chen   CO2 28 22 - 32 mmol/Chen   Glucose, Bld 134 (H) 65 - 99 mg/dL   BUN 23 (H) 6 - 20 mg/dL   Creatinine, Ser 0.80 0.44 - 1.00 mg/dL   Calcium 9.3 8.9 - 10.3 mg/dL   GFR calc non Af Amer >60 >60 mL/min   GFR calc Af Amer >60 >60 mL/min    Comment: (NOTE) The eGFR has been calculated using the CKD EPI equation. This calculation has not been validated in all clinical situations. eGFR's persistently <60 mL/min signify possible Chronic Kidney Disease.    Anion gap 8 5 - 15  CBC     Status: Abnormal   Collection Time: 01/20/17  6:49 AM  Result Value Ref Range   WBC 11.8 (H) 4.0 - 10.5 K/uL   RBC 3.12 (Chen) 3.87 - 5.11 MIL/uL   Hemoglobin 9.2 (Chen) 12.0 - 15.0 g/dL   HCT 28.2 (Chen) 36.0 -  46.0 %   MCV 90.4 78.0 - 100.0 fL   MCH 29.5 26.0 - 34.0 pg   MCHC 32.6 30.0 - 36.0 g/dL   RDW 16.6 (H) 11.5 - 15.5 %   Platelets 498 (H) 150 - 400 K/uL    ABGS No results for input(s): PHART, PO2ART, TCO2, HCO3 in the last 72 hours.  Invalid input(s): PCO2 CULTURES Recent Results (from the past 240 hour(s))  Culture, blood (routine x 2) Call MD if unable to obtain prior to antibiotics being given     Status: None   Collection Time: 01/13/2017  7:28 AM  Result Value Ref Range Status   Specimen Description BLOOD RIGHT ARM  Final   Special Requests   Final    BOTTLES DRAWN AEROBIC AND ANAEROBIC Blood Culture adequate volume   Culture NO GROWTH 5 DAYS  Final   Report Status 01/19/2017 FINAL  Final  Culture, blood (routine x 2) Call MD if unable to obtain prior to antibiotics being given     Status: None   Collection Time: 01/05/2017  7:33 AM  Result Value Ref Range Status   Specimen Description BLOOD RIGHT WRIST  Final   Special Requests   Final    BOTTLES DRAWN AEROBIC AND ANAEROBIC Blood Culture adequate volume   Culture NO GROWTH 5 DAYS  Final   Report Status 01/19/2017 FINAL  Final   Studies/Results: No results found.  Medications:  Prior to Admission:  Prescriptions Prior to Admission  Medication Sig Dispense Refill Last Dose  . acetaminophen (TYLENOL) 325 MG tablet Take 2 tablets (650 mg total) by mouth every 4 (four) hours as needed for mild pain, moderate pain, fever or headache. 100 tablet 1 Past Week at Unknown time  . amiodarone (PACERONE) 200 MG tablet Take 200 mg by mouth daily.    01/13/2017 at Unknown time  . aspirin EC 81 MG tablet Take  81 mg by mouth daily.   01/13/2017 at Unknown time  . atorvastatin (LIPITOR) 40 MG tablet TAKE 1 TABLET (40 MG TOTAL) BY MOUTH DAILY. FOR HYPERLIPIDEMIA 90 tablet 1 01/13/2017 at Unknown time  . busPIRone (BUSPAR) 15 MG tablet Take 1 tablet (15 mg total) by mouth 2 (two) times daily. 180 tablet 2 01/13/2017 at Unknown time  . DULoxetine  (CYMBALTA) 60 MG capsule Take 1 capsule (60 mg total) by mouth daily. 30 capsule 2 01/13/2017 at Unknown time  . fish oil-omega-3 fatty acids 1000 MG capsule Take 3 capsules (3 g total) by mouth daily. For hyperlipidemia. (Patient taking differently: Take 1 g by mouth 3 (three) times daily. For hyperlipidemia.) 30 capsule  01/13/2017 at Unknown time  . fluticasone (FLONASE) 50 MCG/ACT nasal spray Place 2 sprays into both nostrils 2 (two) times daily. Use as needed (Patient taking differently: Place 2 sprays into both nostrils 2 (two) times daily as needed for allergies. Use as needed) 16 g 6 unknown  . gabapentin (NEURONTIN) 100 MG capsule Take 1 capsule (100 mg total) by mouth at bedtime. 90 capsule 2 01/13/2017 at Unknown time  . nitroGLYCERIN (NITROSTAT) 0.4 MG SL tablet Place 0.4 mg under the tongue.   unknown  . omeprazole (PRILOSEC) 40 MG capsule Take 1 capsule (40 mg total) by mouth daily. 30 capsule 2 01/13/2017 at Unknown time  . warfarin (COUMADIN) 2.5 MG tablet Take 3 mg by mouth daily.    01/13/2017 at 0730  . [DISCONTINUED] HYDROcodone-homatropine (HYCODAN) 5-1.5 MG/5ML syrup Take 5 mLs by mouth every 6 (six) hours as needed for cough.   01/13/2017 at Unknown time   Scheduled: . amiodarone  200 mg Oral Daily  . aspirin EC  81 mg Oral Daily  . atorvastatin  40 mg Oral q1800  . busPIRone  15 mg Oral BID  . DULoxetine  60 mg Oral Daily  . gabapentin  100 mg Oral QHS  . guaiFENesin  600 mg Oral BID  . ipratropium  0.5 mg Nebulization Q6H WA  . levalbuterol  1.25 mg Nebulization Q6H WA  . magic mouthwash  5 mL Oral TID PC & HS  . methylPREDNISolone (SOLU-MEDROL) injection  40 mg Intravenous Q12H  . pantoprazole  40 mg Oral Daily  . potassium chloride  20 mEq Oral BID  . Warfarin - Pharmacist Dosing Inpatient   Does not apply q1800   Continuous: . ceFEPime (MAXIPIME) IV Stopped (01/20/17 0630)  . vancomycin Stopped (01/19/17 1910)   IFO:YDXAJOINO, hydrALAZINE,  nitroGLYCERIN  Assesment:She was admitted with healthcare associated pneumonia. She has acute hypoxic respiratory failure and has had high oxygen requirements. At baseline she has had lung cancer and although she doesn't have a diagnosis of COPD I think she undoubtedly does have COPD as well. She has a long smoking history. She had acute on chronic diastolic heart failure but I agree with Dr. Jerilee Hoh that she is euvolemic now. She has chronic atrial fib which is stable. She has had trouble with bronchospasm and she is on IV steroids. Principal Problem:   Hypoxia Active Problems:   Depression   Essential hypertension, benign   Hyperlipidemia   Atrial fibrillation ([facility]) [I48.91]   HCAP (healthcare-associated pneumonia)   Hypokalemia   Bronchospasm    Plan: She is slowly responding. I would continue current treatments. I will be out of town starting tomorrow so I will sign off at this point. Thanks for allowing me to see her with you. I told the patient  I would be gone but there was no family at bedside this morning    LOS: 4 days   Alexandria Chen 01/20/2017, 7:44 AM

## 2017-01-20 NOTE — Progress Notes (Signed)
Responded to call for low O2 saturation. Pt on 9L HFNC with O2 sat between 79-84%. Breath sound are diminished. O2 in proper place in nostril. Pt in mild distress. PRN albuterol treatment given with O2 increasing to 95%. SonOllen Gross at bedside. Pt resting and more comfortable after breathing treatment

## 2017-01-20 NOTE — Progress Notes (Signed)
Patient is currently on 9 liters high flow.  Tele called O2 sats 79 to 84.   Respiratory called for assistance.  Deep breath and cough exercises performed.  Respiratory performing breathing treatment.

## 2017-01-21 ENCOUNTER — Encounter: Payer: Self-pay | Admitting: *Deleted

## 2017-01-21 ENCOUNTER — Encounter: Payer: Self-pay | Admitting: Pharmacist Clinician (PhC)/ Clinical Pharmacy Specialist

## 2017-01-21 LAB — BLOOD GAS, ARTERIAL
Acid-Base Excess: 1.1 mmol/L (ref 0.0–2.0)
BICARBONATE: 25.2 mmol/L (ref 20.0–28.0)
DRAWN BY: 221791
O2 CONTENT: 4 L/min
O2 SAT: 84.6 %
PATIENT TEMPERATURE: 37
PO2 ART: 57.3 mmHg — AB (ref 83.0–108.0)
pCO2 arterial: 39.7 mmHg (ref 32.0–48.0)
pH, Arterial: 7.418 (ref 7.350–7.450)

## 2017-01-21 LAB — PROCALCITONIN: Procalcitonin: 0.12 ng/mL

## 2017-01-21 LAB — PROTIME-INR
INR: 2.81
PROTHROMBIN TIME: 29.4 s — AB (ref 11.4–15.2)

## 2017-01-21 LAB — BASIC METABOLIC PANEL
Anion gap: 6 (ref 5–15)
BUN: 22 mg/dL — ABNORMAL HIGH (ref 6–20)
CHLORIDE: 101 mmol/L (ref 101–111)
CO2: 28 mmol/L (ref 22–32)
CREATININE: 0.81 mg/dL (ref 0.44–1.00)
Calcium: 9.1 mg/dL (ref 8.9–10.3)
GFR calc Af Amer: >60 mL/min (ref >60)
GFR calc non Af Amer: >60 mL/min (ref >60)
GLUCOSE: 136 mg/dL — AB (ref 65–99)
Potassium: 5 mmol/L (ref 3.5–5.1)
Sodium: 135 mmol/L (ref 135–145)

## 2017-01-21 MED ORDER — WARFARIN SODIUM 1 MG PO TABS
1.5000 mg | ORAL_TABLET | Freq: Once | ORAL | Status: AC
  Administered 2017-01-21: 1.5 mg via ORAL
  Filled 2017-01-21: qty 2

## 2017-01-21 NOTE — Progress Notes (Signed)
Physical Therapy Treatment Patient Details Name: Alexandria Chen MRN: [id number] DOB: [birth year] Today's Date: 01/21/2017    History of Present Illness Alexandria Chen is a 82 y.o. female  with past medical history of atrial fibrillation anxiety depression hypertension and lung mass who recently finished chemotherapy who presents with shortness of breath. Patient was recently treated for lung infection in Aug. Comes in today due to continued weakness. Slightly decreased by mouth intake. Was admitted inpatient rehabilitation. Daughter alarmed because patient was diaphoretic and had difficulty just ambulating across the room. Rescue squad activated the patient found to be hypoxic. Brought to the emergency room for evaluation.    PT Comments    Patient demonstrates increased endurance for gait training, limited secondary to fatigue and O2 desating to mid 60% while on 6 LPM, able to recover to %80 when put back to bed.  Patient will benefit from continued physical therapy in hospital and recommended venue below to increase strength, balance, endurance for safe ADLs and gait.  Patient will benefit from use of wheelchair for home use as follows:  Patient suffers from severe SOB which impairs their ability to perform daily activities like walking, cleaning self, routine activity in the home.  A walker alone will not resolve the issues with performing activities of daily living. A wheelchair will allow patient to safely perform daily activities.  The patient can self propel in the home or has a caregiver who can provide assistance.       Follow Up Recommendations  Home health PT;Supervision/Assistance - 24 hour;Supervision for mobility/OOB     Equipment Recommendations  Wheelchair (measurements PT);Wheelchair cushion (measurements PT)    Recommendations for Other Services       Precautions / Restrictions Precautions Precautions: Fall Precaution Comments: frequent falls, syncopy secondary to BP  dropping Restrictions Weight Bearing Restrictions: No Other Position/Activity Restrictions: monitor O2 sats    Mobility  Bed Mobility Overal bed mobility: Needs Assistance Bed Mobility: Supine to Sit;Sit to Supine     Supine to sit: Supervision Sit to supine: Supervision   General bed mobility comments: Demonstrates good return for using BUE/LE for moving up in bed  Transfers Overall transfer level: Needs assistance Equipment used: Rolling walker (2 wheeled) Transfers: Sit to/from Stand Sit to Stand: Min guard            Ambulation/Gait Ambulation/Gait assistance: Min assist Ambulation Distance (Feet): 25 Feet Assistive device: Rolling walker (2 wheeled) Gait Pattern/deviations: Decreased stride length;Decreased step length - right;Decreased step length - left   Gait velocity interpretation: Below normal speed for age/gender General Gait Details: Demonstrates slightly unsteady slow cadence without loss of balance, safety decreases when patient becomes SOB   Stairs            Wheelchair Mobility    Modified Rankin (Stroke Patients Only)       Balance Overall balance assessment: Needs assistance Sitting-balance support: Feet supported;No upper extremity supported Sitting balance-Leahy Scale: Good     Standing balance support: Bilateral upper extremity supported;During functional activity Standing balance-Leahy Scale: Fair                              Cognition Arousal/Alertness: Awake/alert Behavior During Therapy: WFL for tasks assessed/performed Overall Cognitive Status: Within Functional Limits for tasks assessed  Exercises General Exercises - Lower Extremity Ankle Circles/Pumps: Seated;AROM;Both;10 reps Long Arc Quad: Seated;AROM;Both;10 reps Hip Flexion/Marching: Seated;AROM;Both;10 reps    General Comments        Pertinent Vitals/Pain Pain Assessment: No/denies pain     Home Living Family/patient expects to be discharged to:: Private residence                    Prior Function            PT Goals (current goals can now be found in the care plan section) Acute Rehab PT Goals Patient Stated Goal: to go home PT Goal Formulation: With patient/family Time For Goal Achievement: 01/24/17 Potential to Achieve Goals: Good Progress towards PT goals: Progressing toward goals    Frequency    Min 3X/week      PT Plan Current plan remains appropriate    Co-evaluation              AM-PAC PT "6 Clicks" Daily Activity  Outcome Measure  Difficulty turning over in bed (including adjusting bedclothes, sheets and blankets)?: None Difficulty moving from lying on back to sitting on the side of the bed? : None Difficulty sitting down on and standing up from a chair with arms (e.g., wheelchair, bedside commode, etc,.)?: None Help needed moving to and from a bed to chair (including a wheelchair)?: A Little Help needed walking in hospital room?: A Little Help needed climbing 3-5 steps with a railing? : A Little 6 Click Score: 21    End of Session Equipment Utilized During Treatment: Gait belt;Oxygen Activity Tolerance: Patient tolerated treatment well;Patient limited by fatigue Patient left: in bed;with call bell/phone within reach;with family/visitor present Nurse Communication: Mobility status PT Visit Diagnosis: Unsteadiness on feet (R26.81);Other abnormalities of gait and mobility (R26.89);Muscle weakness (generalized) (M62.81)     Time: 1440-1511 PT Time Calculation (min) (ACUTE ONLY): 31 min  Charges:  $Therapeutic Activity: 23-37 mins                    G Codes:       3:46 PM, 29-Jan-2017 Lonell Grandchild, MPT Physical Therapist with [facility] [phone] office [phone] mobile phone

## 2017-01-21 NOTE — Progress Notes (Signed)
Patients oxygen decreased to 6 L/m  HFNC (from 9L/m.) ABG will be obtained in an hour. RN notified of change in FiO2 and time for ABG.

## 2017-01-21 NOTE — Care Management Note (Signed)
Case Management Note  Patient Details  Name: Alexandria Chen MRN: [id number] Date of Birth: Sep 19, 1934  Expected Discharge Date:     01/22/2017             Expected Discharge Plan:  [place]  In-House Referral:  NA  Discharge planning Services  CM Consult  Post Acute Care Choice:  Home Health, Resumption of Svcs/PTA Provider, Durable Medical Equipment Choice offered to:  Patient  DME Arranged:  Chiropodist, Wheelchair manual, Oxygen DME Agency:  [place] Arranged:  RN, PT, OT, Nurse's Aide, Respirator Therapy HH Agency:  [place]  Status of Service:  Completed, signed off  Additional Comments: Anticipate DC over weekend. Pt will have 24/7 supervision. Pt will resume [place] services with [facility], Santiago Glad, [facility] rep, aware of DC planned for weekend, will pull resumption order from Epic. CM requested they make resumption visit as soon as possible as family is very anxious about pt going home on HFNC. RN will verify DC with [facility] rep over weekend. Pt will need WC, neb machine and home oxygen at 10L. Pt/family has chosen [facility] from list of DME providers. Vaughan Basta, [facility] rep, aware of referral and will obtain pt info from chart. DME will be delivered to pt room prior to DC. CM had long discussion with dtr kim about pt's ability to be cared for at home. Pt has ambulated with PT. Family concerned about their inability to monitor vital signs at home, CM explained that pulse ox and BP machine available OTC. They do not have to monitor continuously, check pt's saturations if pt feels like something has changes, feels SOB or begin to be confused of have AMS. Family told that [facility] providers will be a Microbiologist and will provide pt/family with education and support as needed.   Sherald Barge, RN 01/21/2017, 12:58 PM

## 2017-01-21 NOTE — Progress Notes (Signed)
Sats dropped to 87% on 8L [place], RRT placed nonrebreather and sats increased to 98-100%, MD aware of change. Pt not in any distress, other vitals stable, will continue to monitor.

## 2017-01-21 NOTE — Care Management Important Message (Signed)
Important Message  Patient Details  Name: Alexandria Chen MRN: [id number] Date of Birth: 01-21-1935   Medicare Important Message Given:  Yes    Sherald Barge, RN 01/21/2017, 1:05 PM

## 2017-01-21 NOTE — Progress Notes (Signed)
PROGRESS NOTE    Alexandria Chen  WUJ:[id number] DOB: 1934/06/05 DOA: 01/24/2017 PCP: [name], FNP     Brief Narrative:  82 year old woman admitted from home on 9/14 due to shortness of breath. She has a history of atrial fibrillation, anxiety, depression, hypertension and lung cancer who recently finished chemotherapy. She was recently treated for pneumonia in August. Came in due to continued weakness and difficulty breathing with ambulation. 911 was activated and patient was found to be hypoxic and was brought into the hospital for evaluation.   Assessment & Plan:   Principal Problem:   Hypoxia Active Problems:   Depression   Essential hypertension, benign   Hyperlipidemia   Atrial fibrillation (HCC) [I48.91]   HCAP (healthcare-associated pneumonia)   Hypokalemia   Bronchospasm   Acute hypoxemic respiratory failure -Believed to be multifactorial: Healthcare associated pneumonia, acute diastolic dysfunction, COPD (not formally diagnosed but does have an extensive smoking history and significant wheezing on exam), also history of lung cancer. -Plan to continue antibiotic therapy with vancomycin and cefepime, length of treatment approximately to be 8-10 days. Has 1 more day remaining. -Continue steroids, nebs, incentive spirometry with flutter valve. Lung auscultation is improved today. -Appreciate Dr. Luan Pulling input and recommendations. -She currently appears euvolemic. Lasix is on hold due to orthostatic hypotension. -She is still requiring a high concentration of oxygen, between 6 and 9 L. -Anticipate discharge home in 24 hours with oxygen.  Depression with anxiety -No suicidal or homicidal ideation. -Continue Cymbalta, BuSpar  Hypertension Stable, no longer hypotensive  Hyperlipidemia -Continue statin   paroxysmal atrial fibrillation -Anticoagulated on Coumadin, currently rate controlled.  GERD -Continue PPI  Coronary artery disease -Stable, no chest  pain  Chronic pain syndrome -Continue Neurontin   DVT prophylaxis: Anticoagulated on Coumadin Code Status: Full Code Family Communication: Discussed at length with 2 daughters at bedside Disposition Plan: Anticipate discharge home in 24 hours  Consultants:   Pulmonary, Dr. Luan Pulling   Procedures:   None   Antimicrobials:  Anti-infectives    Start     Dose/Rate Route Frequency Ordered Stop   01/20/17 1000  vancomycin (VANCOCIN) IVPB 750 mg/150 ml premix     750 mg 150 mL/hr over 60 Minutes Intravenous Every 12 hours 01/20/17 0925     01/17/17 1800  vancomycin (VANCOCIN) 500 mg in sodium chloride 0.9 % 100 mL IVPB  Status:  Discontinued     500 mg 100 mL/hr over 60 Minutes Intravenous Every 12 hours 01/17/17 0934 01/20/17 0925   01/17/17 0700  vancomycin (VANCOCIN) IVPB 1000 mg/200 mL premix     1,000 mg 200 mL/hr over 60 Minutes Intravenous  Once 01/17/17 0650 01/17/17 0800   01/16/17 0000  cefpodoxime (VANTIN) 200 MG tablet     200 mg Oral 2 times daily 01/16/17 0841 01/23/17 2359   01/15/17 0600  ceFEPIme (MAXIPIME) 1 g in dextrose 5 % 50 mL IVPB     1 g 100 mL/hr over 30 Minutes Intravenous Every 24 hours 01/30/2017 0819     01/29/2017 0530  cefTRIAXone (ROCEPHIN) 1 g in dextrose 5 % 50 mL IVPB  Status:  Discontinued     1 g 100 mL/hr over 30 Minutes Intravenous  Once 01/23/2017 0518 01/29/2017 0526   01/19/2017 0530  ceFEPIme (MAXIPIME) 2 g in dextrose 5 % 50 mL IVPB     2 g 100 mL/hr over 30 Minutes Intravenous  Once 01/28/2017 0526 01/23/2017 0633       Subjective: Still a little  short of breath with ambulation but otherwise no complaints  Objective: Vitals:   01/21/17 0447 01/21/17 0727 01/21/17 1305 01/21/17 1509  BP: 136/67   (!) 108/51  Pulse: 70   87  Resp: 18   20  Temp: 98.2 F (36.8 C)   98.6 F (37 C)  TempSrc: Oral   Oral  SpO2: 95% 94% 96% 92%  Weight: 60.8 kg (134 lb 0.6 oz)     Height:        Intake/Output Summary (Last 24 hours) at 01/21/17  1606 Last data filed at 01/21/17 1500  Gross per 24 hour  Intake             1440 ml  Output             2050 ml  Net             -610 ml   Filed Weights   01/19/17 0614 01/20/17 0534 01/21/17 0447  Weight: 64 kg (141 lb 1.6 oz) 60.1 kg (132 lb 8 oz) 60.8 kg (134 lb 0.6 oz)    Examination:  General exam: Alert, awake, oriented x 3 Respiratory system: Clear to auscultation. Respiratory effort normal. Cardiovascular system:RRR. No murmurs, rubs, gallops. Gastrointestinal system: Abdomen is nondistended, soft and nontender. No organomegaly or masses felt. Normal bowel sounds heard. Central nervous system: Alert and oriented. No focal neurological deficits. Extremities: No C/C/E, +pedal pulses Skin: No rashes, lesions or ulcers Psychiatry: Judgement and insight appear normal. Mood & affect appropriate.       Data Reviewed: I have personally reviewed following labs and imaging studies  CBC:  Recent Labs Lab 01/15/17 0644 01/16/17 0618 01/19/17 0234 01/20/17 0649  WBC 13.9* 10.9* 12.3* 11.8*  NEUTROABS 12.1*  --   --   --   HGB 8.5* 8.8* 8.0* 9.2*  HCT 25.9* 26.8* 24.3* 28.2*  MCV 90.2 91.2 89.3 90.4  PLT 393 458* 510* 086*   Basic Metabolic Panel:  Recent Labs Lab 01/17/17 0624 01/18/17 0619 01/19/17 0234 01/20/17 0649 01/21/17 0502  NA 138 136 135 138 135  K 4.3 4.1 4.4 5.5* 5.0  CL 102 100* 102 102 101  CO2 28 28 27 28 28   GLUCOSE 103* 103* 94 134* 136*  BUN 20 23* 24* 23* 22*  CREATININE 0.86 0.84 0.86 0.80 0.81  CALCIUM 9.1 8.9 8.8* 9.3 9.1   GFR: Estimated Creatinine Clearance: 45.8 mL/min (by C-G formula based on SCr of 0.81 mg/dL). Liver Function Tests:  Recent Labs Lab 01/17/17 0624  AST 32  ALT 36  ALKPHOS 70  BILITOT 0.5  PROT 6.7  ALBUMIN 2.3*   No results for input(s): LIPASE, AMYLASE in the last 168 hours. No results for input(s): AMMONIA in the last 168 hours. Coagulation Profile:  Recent Labs Lab 01/17/17 0624 01/18/17 0619  01/19/17 0234 01/20/17 0649 01/21/17 0502  INR 3.14 2.77 2.46 2.29 2.81   Cardiac Enzymes:  Recent Labs Lab 01/18/17 1151 01/18/17 1946 01/19/17 0234  TROPONINI <0.03 <0.03 <0.03   BNP (last 3 results) No results for input(s): PROBNP in the last 8760 hours. HbA1C: No results for input(s): HGBA1C in the last 72 hours. CBG:  Recent Labs Lab 01/18/17 1143  GLUCAP 123*   Lipid Profile: No results for input(s): CHOL, HDL, LDLCALC, TRIG, CHOLHDL, LDLDIRECT in the last 72 hours. Thyroid Function Tests: No results for input(s): TSH, T4TOTAL, FREET4, T3FREE, THYROIDAB in the last 72 hours. Anemia Panel: No results for input(s): VITAMINB12, FOLATE, FERRITIN, TIBC,  IRON, RETICCTPCT in the last 72 hours. Urine analysis:    Component Value Date/Time   COLORURINE YELLOW 11/27/2016 1400   APPEARANCEUR CLEAR 11/27/2016 1400   APPEARANCEUR Clear 09/10/2016 1114   LABSPEC 1.010 11/27/2016 1400   PHURINE 6.0 11/27/2016 1400   GLUCOSEU NEGATIVE 11/27/2016 1400   HGBUR NEGATIVE 11/27/2016 1400   BILIRUBINUR NEGATIVE 11/27/2016 1400   BILIRUBINUR Negative 09/10/2016 1114   KETONESUR NEGATIVE 11/27/2016 1400   PROTEINUR NEGATIVE 11/27/2016 1400   UROBILINOGEN 0.2 10/14/2011 1132   NITRITE NEGATIVE 11/27/2016 1400   LEUKOCYTESUR NEGATIVE 11/27/2016 1400   LEUKOCYTESUR Negative 09/10/2016 1114   Sepsis Labs: @LABRCNTIP (procalcitonin:4,lacticidven:4)  ) Recent Results (from the past 240 hour(s))  Culture, blood (routine x 2) Call MD if unable to obtain prior to antibiotics being given     Status: None   Collection Time: 01/12/2017  7:28 AM  Result Value Ref Range Status   Specimen Description BLOOD RIGHT ARM  Final   Special Requests   Final    BOTTLES DRAWN AEROBIC AND ANAEROBIC Blood Culture adequate volume   Culture NO GROWTH 5 DAYS  Final   Report Status 01/19/2017 FINAL  Final  Culture, blood (routine x 2) Call MD if unable to obtain prior to antibiotics being given      Status: None   Collection Time: 01/19/2017  7:33 AM  Result Value Ref Range Status   Specimen Description BLOOD RIGHT WRIST  Final   Special Requests   Final    BOTTLES DRAWN AEROBIC AND ANAEROBIC Blood Culture adequate volume   Culture NO GROWTH 5 DAYS  Final   Report Status 01/19/2017 FINAL  Final         Radiology Studies: No results found.      Scheduled Meds: . amiodarone  200 mg Oral Daily  . aspirin EC  81 mg Oral Daily  . atorvastatin  40 mg Oral q1800  . busPIRone  15 mg Oral BID  . DULoxetine  60 mg Oral Daily  . gabapentin  100 mg Oral QHS  . guaiFENesin  600 mg Oral BID  . ipratropium  0.5 mg Nebulization Q6H WA  . levalbuterol  1.25 mg Nebulization Q6H WA  . magic mouthwash  5 mL Oral TID PC & HS  . methylPREDNISolone (SOLU-MEDROL) injection  40 mg Intravenous Q12H  . pantoprazole  40 mg Oral Daily  . warfarin  1.5 mg Oral Once  . Warfarin - Pharmacist Dosing Inpatient   Does not apply q1800   Continuous Infusions: . ceFEPime (MAXIPIME) IV Stopped (01/21/17 0543)  . vancomycin Stopped (01/21/17 1212)     LOS: 5 days    Time spent: 30 minutes . Greater than 50% of this time was spent in direct contact with the patient coordinating care.     Lelon Frohlich, MD [facility] Pager [phone]  If 7PM-7AM, please contact night-coverage [email or web address] Password [email or web address] 01/21/2017, 4:06 PM

## 2017-01-21 NOTE — Care Management (Signed)
    Durable Medical Equipment        Start     Ordered   01/21/17 1507  For home use only DME oxygen  Once    Question Answer Comment  Mode or (Route) Nasal cannula   Liters per Minute 8   Frequency Continuous (stationary and portable oxygen unit needed)   Oxygen delivery system Gas      01/21/17 1506   01/21/17 1257  For home use only DME Nebulizer machine  Once    Question:  Patient needs a nebulizer to treat with the following condition  Answer:  COPD (chronic obstructive pulmonary disease) ([place])   01/21/17 1257   01/21/17 1256  For home use only DME standard manual wheelchair with seat cushion  Once    Comments:  Patient suffers from COPD which impairs their ability to perform daily activities like ambulating in the home.  A walker will not resolve issue with performing activities of daily living. A wheelchair will allow patient to safely perform daily activities. Patient can safely propel the wheelchair in the home or has a caregiver who can provide assistance.  Accessories: elevating leg rests (ELRs), wheel locks, extensions and anti-tippers.   01/21/17 1257   01/27/2017 1109  For home use only DME Nebulizer machine  Once    Question:  Patient needs a nebulizer to treat with the following condition  Answer:  COPD (chronic obstructive pulmonary disease) ([place])   01/08/2017 1109    Pt will need supplemental oxygen at discharge because alternate treatments (IV steroids, IV lasix and neb treatments) have been tried and found to be insufficient.

## 2017-01-21 NOTE — Progress Notes (Signed)
ANTICOAGULATION CONSULT NOTE  Pharmacy Consult for Coumadin Indication: atrial fibrillation  No Known Allergies  Patient Measurements: Height: 5\' 3"  (160 cm) Weight: 134 lb 0.6 oz (60.8 kg) IBW/kg (Calculated) : 52.4   Vital Signs: Temp: 98.2 F (36.8 C) (09/21 0447) Temp Source: Oral (09/21 0447) BP: 136/67 (09/21 0447) Pulse Rate: 70 (09/21 0447)  Labs:  Recent Labs  01/18/17 1151 01/18/17 1946 01/19/17 0234 01/20/17 0649 01/21/17 0502  HGB  --   --  8.0* 9.2*  --   HCT  --   --  24.3* 28.2*  --   PLT  --   --  510* 498*  --   LABPROT  --   --  26.5* 25.0* 29.4*  INR  --   --  2.46 2.29 2.81  CREATININE  --   --  0.86 0.80 0.81  TROPONINI <0.03 <0.03 <0.03  --   --     Estimated Creatinine Clearance: 45.8 mL/min (by C-G formula based on SCr of 0.81 mg/dL).   Medical History: Past Medical History:  Diagnosis Date  . A-fib ([place])   . Alcohol abuse, in remission   . Anxiety   . Atrial fibrillation ([place])   . Depression   . Dyspnea    walking distances   . Dysrhythmia   . History of bronchitis   . HTN (hypertension)   . Mass of right lung 07/13/2016  . Osteopenia   . Squamous cell carcinoma of right lung ([place]) 07/13/2016    Medications:  Prescriptions Prior to Admission  Medication Sig Dispense Refill Last Dose  . acetaminophen (TYLENOL) 325 MG tablet Take 2 tablets (650 mg total) by mouth every 4 (four) hours as needed for mild pain, moderate pain, fever or headache. 100 tablet 1 Past Week at Unknown time  . amiodarone (PACERONE) 200 MG tablet Take 200 mg by mouth daily.    01/13/2017 at Unknown time  . aspirin EC 81 MG tablet Take 81 mg by mouth daily.   01/13/2017 at Unknown time  . atorvastatin (LIPITOR) 40 MG tablet TAKE 1 TABLET (40 MG TOTAL) BY MOUTH DAILY. FOR HYPERLIPIDEMIA 90 tablet 1 01/13/2017 at Unknown time  . busPIRone (BUSPAR) 15 MG tablet Take 1 tablet (15 mg total) by mouth 2 (two) times daily. 180 tablet 2 01/13/2017 at Unknown time  .  DULoxetine (CYMBALTA) 60 MG capsule Take 1 capsule (60 mg total) by mouth daily. 30 capsule 2 01/13/2017 at Unknown time  . fish oil-omega-3 fatty acids 1000 MG capsule Take 3 capsules (3 g total) by mouth daily. For hyperlipidemia. (Patient taking differently: Take 1 g by mouth 3 (three) times daily. For hyperlipidemia.) 30 capsule  01/13/2017 at Unknown time  . fluticasone (FLONASE) 50 MCG/ACT nasal spray Place 2 sprays into both nostrils 2 (two) times daily. Use as needed (Patient taking differently: Place 2 sprays into both nostrils 2 (two) times daily as needed for allergies. Use as needed) 16 g 6 unknown  . gabapentin (NEURONTIN) 100 MG capsule Take 1 capsule (100 mg total) by mouth at bedtime. 90 capsule 2 01/13/2017 at Unknown time  . nitroGLYCERIN (NITROSTAT) 0.4 MG SL tablet Place 0.4 mg under the tongue.   unknown  . omeprazole (PRILOSEC) 40 MG capsule Take 1 capsule (40 mg total) by mouth daily. 30 capsule 2 01/13/2017 at Unknown time  . warfarin (COUMADIN) 2.5 MG tablet Take 3 mg by mouth daily.    01/13/2017 at 0730  . [DISCONTINUED] HYDROcodone-homatropine (HYCODAN) 5-1.5 MG/5ML syrup  Take 5 mLs by mouth every 6 (six) hours as needed for cough.   01/13/2017 at Unknown time    Assessment: 82 y.o. female  with past medical history of atrial fibrillation,   Pharmacy asked to dose Coumadin. Patient is followed by anticoag clinic and takes 3mg  on Sunday and Thursday  And 1.5mg  On Monday, Tuesday, Wednesday, Friday and Saturday. INR remains therapeutic this morning.  Goal of Therapy:  INR 2-3 Monitor platelets by anticoagulation protocol: Yes   Plan:  Coumadin 1.5 mg today x 1  PT-INR daily Monitor for S/S of bleeding  Beverlee Nims, RPH  01/21/2017,10:06 AM

## 2017-01-21 NOTE — Progress Notes (Signed)
SATURATION QUALIFICATIONS: (This note is used to comply with regulatory documentation for home oxygen)  Patient Saturations on 4L/[place] = 84% Increased O2 to 8L sats returned to 96%

## 2017-01-22 LAB — PROTIME-INR
INR: 3.11
PROTHROMBIN TIME: 31.8 s — AB (ref 11.4–15.2)

## 2017-01-22 MED ORDER — ALUM & MAG HYDROXIDE-SIMETH 200-200-20 MG/5ML PO SUSP
30.0000 mL | Freq: Three times a day (TID) | ORAL | Status: DC | PRN
  Administered 2017-01-22: 30 mL via ORAL
  Filled 2017-01-22: qty 30

## 2017-01-22 MED ORDER — SALINE SPRAY 0.65 % NA SOLN
1.0000 | NASAL | Status: DC | PRN
  Administered 2017-01-22 – 2017-01-28 (×2): 1 via NASAL
  Filled 2017-01-22 (×3): qty 44

## 2017-01-22 MED ORDER — FUROSEMIDE 10 MG/ML IJ SOLN
40.0000 mg | Freq: Every day | INTRAMUSCULAR | Status: DC
  Administered 2017-01-22 – 2017-01-23 (×2): 40 mg via INTRAVENOUS
  Filled 2017-01-22 (×2): qty 4

## 2017-01-22 MED ORDER — METHYLPREDNISOLONE SODIUM SUCC 125 MG IJ SOLR
80.0000 mg | Freq: Two times a day (BID) | INTRAMUSCULAR | Status: DC
  Administered 2017-01-22 – 2017-01-25 (×6): 80 mg via INTRAVENOUS
  Filled 2017-01-22 (×6): qty 2

## 2017-01-22 MED ORDER — WARFARIN SODIUM 1 MG PO TABS
0.5000 mg | ORAL_TABLET | Freq: Once | ORAL | Status: AC
  Administered 2017-01-22: 0.5 mg via ORAL
  Filled 2017-01-22: qty 1

## 2017-01-22 NOTE — Progress Notes (Signed)
PROGRESS NOTE    Alexandria Chen  WGN:[id number] DOB: 12/05/1934 DOA: 01/13/2017 PCP: [name], FNP     Brief Narrative:  82 year old woman admitted from home on 9/14 due to shortness of breath. She has a history of atrial fibrillation, anxiety, depression, hypertension and lung cancer who recently finished chemotherapy. She was recently treated for pneumonia in August. Came in due to continued weakness and difficulty breathing with ambulation. 911 was activated and patient was found to be hypoxic and was brought into the hospital for evaluation.   Assessment & Plan:   Principal Problem:   Hypoxia Active Problems:   Depression   Essential hypertension, benign   Hyperlipidemia   Atrial fibrillation (HCC) [I48.91]   HCAP (healthcare-associated pneumonia)   Hypokalemia   Bronchospasm   Acute hypoxemic respiratory failure -Believed to be multifactorial: Healthcare associated pneumonia, acute diastolic dysfunction, COPD (not formally diagnosed but does have an extensive smoking history and significant wheezing on exam), also history of lung cancer. As discussed via phone with pulmonary, Dr. Titus Mould, on 9/22 after he has reviewed images, he believes she may also have a component of radiation pneumonitis. He is recommending doubling up on the steroid dose and allowing 48 hours. If still not significantly improved after 48 hours then consider transfer to [facility] for pulmonary evaluation and possibly bronchoscopy. -Has completed antibiotics will discontinue as of today, 9/22. -Continue steroids, nebs, incentive spirometry with flutter valve. Lung auscultation is improved today. -She currently appears euvolemic. However per Dr. Titus Mould recommendations, will go ahead and resume Lasix today. -She is still requiring a high concentration of oxygen, between 8 and 10 L. -Anticipate discharge home in 24 hours with oxygen.  Depression with anxiety -No suicidal or homicidal  ideation. -Continue Cymbalta, BuSpar  Hypertension Stable, no longer hypotensive  Hyperlipidemia -Continue statin   paroxysmal atrial fibrillation -Anticoagulated on Coumadin, currently rate controlled.  GERD -Continue PPI  Coronary artery disease -Stable, no chest pain  Chronic pain syndrome -Continue Neurontin   DVT prophylaxis: Anticoagulated on Coumadin Code Status: Full Code Family Communication: Discussed at length with 2 daughters at bedside Disposition Plan: As discussed via phone with pulmonary, will increase steroid dose and allow 48 hours for improvement, if no improvement in this timeframe may need transfer to [facility] for formal pulmonary evaluation.  Consultants:   Pulmonary, Dr. Luan Pulling   Procedures:   None   Antimicrobials:  Anti-infectives    Start     Dose/Rate Route Frequency Ordered Stop   01/20/17 1000  vancomycin (VANCOCIN) IVPB 750 mg/150 ml premix     750 mg 150 mL/hr over 60 Minutes Intravenous Every 12 hours 01/20/17 0925     01/17/17 1800  vancomycin (VANCOCIN) 500 mg in sodium chloride 0.9 % 100 mL IVPB  Status:  Discontinued     500 mg 100 mL/hr over 60 Minutes Intravenous Every 12 hours 01/17/17 0934 01/20/17 0925   01/17/17 0700  vancomycin (VANCOCIN) IVPB 1000 mg/200 mL premix     1,000 mg 200 mL/hr over 60 Minutes Intravenous  Once 01/17/17 0650 01/17/17 0800   01/16/17 0000  cefpodoxime (VANTIN) 200 MG tablet     200 mg Oral 2 times daily 01/16/17 0841 01/23/17 2359   01/15/17 0600  ceFEPIme (MAXIPIME) 1 g in dextrose 5 % 50 mL IVPB     1 g 100 mL/hr over 30 Minutes Intravenous Every 24 hours 01/22/2017 0819     01/28/2017 0530  cefTRIAXone (ROCEPHIN) 1 g in dextrose 5 %  50 mL IVPB  Status:  Discontinued     1 g 100 mL/hr over 30 Minutes Intravenous  Once 01/11/2017 0518 01/24/2017 0526   01/18/2017 0530  ceFEPIme (MAXIPIME) 2 g in dextrose 5 % 50 mL IVPB     2 g 100 mL/hr over 30 Minutes Intravenous  Once 01/24/2017 0526 01/05/2017 3546        Subjective: Lying in bed, appears comfortable, has no acute complaints, states her breathing is "fine"  Objective: Vitals:   01/22/17 0542 01/22/17 0752 01/22/17 0806 01/22/17 0820  BP:  128/76    Pulse:  78    Resp:  20    Temp:  98.4 F (36.9 C)    TempSrc:  Oral    SpO2: 99% 91% (!) 85% 98%  Weight:      Height:        Intake/Output Summary (Last 24 hours) at 01/22/17 1243 Last data filed at 01/22/17 1227  Gross per 24 hour  Intake              240 ml  Output             2026 ml  Net            -1786 ml   Filed Weights   01/20/17 0534 01/21/17 0447 01/22/17 0520  Weight: 60.1 kg (132 lb 8 oz) 60.8 kg (134 lb 0.6 oz) 60.2 kg (132 lb 12.8 oz)    Examination:  General exam: Alert, awake, oriented x 3 Respiratory system: Clear to auscultation. Respiratory effort normal. Cardiovascular system:RRR. No murmurs, rubs, gallops. Gastrointestinal system: Abdomen is nondistended, soft and nontender. No organomegaly or masses felt. Normal bowel sounds heard. Central nervous system: Alert and oriented. No focal neurological deficits. Extremities: No C/C/E, +pedal pulses Skin: No rashes, lesions or ulcers Psychiatry: Judgement and insight appear normal. Mood & affect appropriate.        Data Reviewed: I have personally reviewed following labs and imaging studies  CBC:  Recent Labs Lab 01/16/17 0618 01/19/17 0234 01/20/17 0649  WBC 10.9* 12.3* 11.8*  HGB 8.8* 8.0* 9.2*  HCT 26.8* 24.3* 28.2*  MCV 91.2 89.3 90.4  PLT 458* 510* 568*   Basic Metabolic Panel:  Recent Labs Lab 01/17/17 0624 01/18/17 0619 01/19/17 0234 01/20/17 0649 01/21/17 0502  NA 138 136 135 138 135  K 4.3 4.1 4.4 5.5* 5.0  CL 102 100* 102 102 101  CO2 28 28 27 28 28   GLUCOSE 103* 103* 94 134* 136*  BUN 20 23* 24* 23* 22*  CREATININE 0.86 0.84 0.86 0.80 0.81  CALCIUM 9.1 8.9 8.8* 9.3 9.1   GFR: Estimated Creatinine Clearance: 45.8 mL/min (by C-G formula based on SCr of 0.81  mg/dL). Liver Function Tests:  Recent Labs Lab 01/17/17 0624  AST 32  ALT 36  ALKPHOS 70  BILITOT 0.5  PROT 6.7  ALBUMIN 2.3*   No results for input(s): LIPASE, AMYLASE in the last 168 hours. No results for input(s): AMMONIA in the last 168 hours. Coagulation Profile:  Recent Labs Lab 01/18/17 0619 01/19/17 0234 01/20/17 0649 01/21/17 0502 01/22/17 0625  INR 2.77 2.46 2.29 2.81 3.11   Cardiac Enzymes:  Recent Labs Lab 01/18/17 1151 01/18/17 1946 01/19/17 0234  TROPONINI <0.03 <0.03 <0.03   BNP (last 3 results) No results for input(s): PROBNP in the last 8760 hours. HbA1C: No results for input(s): HGBA1C in the last 72 hours. CBG:  Recent Labs Lab 01/18/17 1143  GLUCAP 123*  Lipid Profile: No results for input(s): CHOL, HDL, LDLCALC, TRIG, CHOLHDL, LDLDIRECT in the last 72 hours. Thyroid Function Tests: No results for input(s): TSH, T4TOTAL, FREET4, T3FREE, THYROIDAB in the last 72 hours. Anemia Panel: No results for input(s): VITAMINB12, FOLATE, FERRITIN, TIBC, IRON, RETICCTPCT in the last 72 hours. Urine analysis:    Component Value Date/Time   COLORURINE YELLOW 11/27/2016 1400   APPEARANCEUR CLEAR 11/27/2016 1400   APPEARANCEUR Clear 09/10/2016 1114   LABSPEC 1.010 11/27/2016 1400   PHURINE 6.0 11/27/2016 1400   GLUCOSEU NEGATIVE 11/27/2016 1400   HGBUR NEGATIVE 11/27/2016 1400   BILIRUBINUR NEGATIVE 11/27/2016 1400   BILIRUBINUR Negative 09/10/2016 1114   KETONESUR NEGATIVE 11/27/2016 1400   PROTEINUR NEGATIVE 11/27/2016 1400   UROBILINOGEN 0.2 10/14/2011 1132   NITRITE NEGATIVE 11/27/2016 1400   LEUKOCYTESUR NEGATIVE 11/27/2016 1400   LEUKOCYTESUR Negative 09/10/2016 1114   Sepsis Labs: @LABRCNTIP (procalcitonin:4,lacticidven:4)  ) Recent Results (from the past 240 hour(s))  Culture, blood (routine x 2) Call MD if unable to obtain prior to antibiotics being given     Status: None   Collection Time: 01/23/2017  7:28 AM  Result Value Ref  Range Status   Specimen Description BLOOD RIGHT ARM  Final   Special Requests   Final    BOTTLES DRAWN AEROBIC AND ANAEROBIC Blood Culture adequate volume   Culture NO GROWTH 5 DAYS  Final   Report Status 01/19/2017 FINAL  Final  Culture, blood (routine x 2) Call MD if unable to obtain prior to antibiotics being given     Status: None   Collection Time: 01/27/2017  7:33 AM  Result Value Ref Range Status   Specimen Description BLOOD RIGHT WRIST  Final   Special Requests   Final    BOTTLES DRAWN AEROBIC AND ANAEROBIC Blood Culture adequate volume   Culture NO GROWTH 5 DAYS  Final   Report Status 01/19/2017 FINAL  Final         Radiology Studies: No results found.      Scheduled Meds: . amiodarone  200 mg Oral Daily  . aspirin EC  81 mg Oral Daily  . atorvastatin  40 mg Oral q1800  . busPIRone  15 mg Oral BID  . DULoxetine  60 mg Oral Daily  . furosemide  40 mg Intravenous Daily  . gabapentin  100 mg Oral QHS  . guaiFENesin  600 mg Oral BID  . ipratropium  0.5 mg Nebulization Q6H WA  . levalbuterol  1.25 mg Nebulization Q6H WA  . magic mouthwash  5 mL Oral TID PC & HS  . methylPREDNISolone (SOLU-MEDROL) injection  80 mg Intravenous Q12H  . pantoprazole  40 mg Oral Daily  . warfarin  0.5 mg Oral Once  . Warfarin - Pharmacist Dosing Inpatient   Does not apply q1800   Continuous Infusions: . ceFEPime (MAXIPIME) IV Stopped (01/22/17 0602)  . vancomycin Stopped (01/22/17 1100)     LOS: 6 days    Time spent: 30 minutes . Greater than 50% of this time was spent in direct contact with the patient coordinating care.     Lelon Frohlich, MD [facility] Pager [phone]  If 7PM-7AM, please contact night-coverage [email or web address] Password [facility] 01/22/2017, 12:43 PM

## 2017-01-22 NOTE — Progress Notes (Signed)
ANTICOAGULATION CONSULT NOTE  Pharmacy Consult for Coumadin Indication: atrial fibrillation  No Known Allergies  Patient Measurements: Height: 5\' 3"  (160 cm) Weight: 132 lb 12.8 oz (60.2 kg) IBW/kg (Calculated) : 52.4   Vital Signs: Temp: 98.4 F (36.9 C) (09/22 0752) Temp Source: Oral (09/22 0752) BP: 128/76 (09/22 0752) Pulse Rate: 78 (09/22 0752)  Labs:  Recent Labs  01/20/17 0649 01/21/17 0502 01/22/17 0625  HGB 9.2*  --   --   HCT 28.2*  --   --   PLT 498*  --   --   LABPROT 25.0* 29.4* 31.8*  INR 2.29 2.81 3.11  CREATININE 0.80 0.81  --     Estimated Creatinine Clearance: 45.8 mL/min (by C-G formula based on SCr of 0.81 mg/dL).   Medical History: Past Medical History:  Diagnosis Date  . A-fib ([place])   . Alcohol abuse, in remission   . Anxiety   . Atrial fibrillation ([place])   . Depression   . Dyspnea    walking distances   . Dysrhythmia   . History of bronchitis   . HTN (hypertension)   . Mass of right lung 07/13/2016  . Osteopenia   . Squamous cell carcinoma of right lung ([place]) 07/13/2016    Medications:  Prescriptions Prior to Admission  Medication Sig Dispense Refill Last Dose  . acetaminophen (TYLENOL) 325 MG tablet Take 2 tablets (650 mg total) by mouth every 4 (four) hours as needed for mild pain, moderate pain, fever or headache. 100 tablet 1 Past Week at Unknown time  . amiodarone (PACERONE) 200 MG tablet Take 200 mg by mouth daily.    01/13/2017 at Unknown time  . aspirin EC 81 MG tablet Take 81 mg by mouth daily.   01/13/2017 at Unknown time  . atorvastatin (LIPITOR) 40 MG tablet TAKE 1 TABLET (40 MG TOTAL) BY MOUTH DAILY. FOR HYPERLIPIDEMIA 90 tablet 1 01/13/2017 at Unknown time  . busPIRone (BUSPAR) 15 MG tablet Take 1 tablet (15 mg total) by mouth 2 (two) times daily. 180 tablet 2 01/13/2017 at Unknown time  . DULoxetine (CYMBALTA) 60 MG capsule Take 1 capsule (60 mg total) by mouth daily. 30 capsule 2 01/13/2017 at Unknown time  . fish  oil-omega-3 fatty acids 1000 MG capsule Take 3 capsules (3 g total) by mouth daily. For hyperlipidemia. (Patient taking differently: Take 1 g by mouth 3 (three) times daily. For hyperlipidemia.) 30 capsule  01/13/2017 at Unknown time  . fluticasone (FLONASE) 50 MCG/ACT nasal spray Place 2 sprays into both nostrils 2 (two) times daily. Use as needed (Patient taking differently: Place 2 sprays into both nostrils 2 (two) times daily as needed for allergies. Use as needed) 16 g 6 unknown  . gabapentin (NEURONTIN) 100 MG capsule Take 1 capsule (100 mg total) by mouth at bedtime. 90 capsule 2 01/13/2017 at Unknown time  . nitroGLYCERIN (NITROSTAT) 0.4 MG SL tablet Place 0.4 mg under the tongue.   unknown  . omeprazole (PRILOSEC) 40 MG capsule Take 1 capsule (40 mg total) by mouth daily. 30 capsule 2 01/13/2017 at Unknown time  . warfarin (COUMADIN) 2.5 MG tablet Take 3 mg by mouth daily.    01/13/2017 at 0730  . [DISCONTINUED] HYDROcodone-homatropine (HYCODAN) 5-1.5 MG/5ML syrup Take 5 mLs by mouth every 6 (six) hours as needed for cough.   01/13/2017 at Unknown time    Assessment: 82 y.o. female  with past medical history of atrial fibrillation,   Pharmacy asked to dose Coumadin. Patient is  followed by anticoag clinic and takes 3mg  on Sunday and Thursday  And 1.5mg  On Monday, Tuesday, Wednesday, Friday and Saturday. INR slightly above goal this am  Goal of Therapy:  INR 2-3 Monitor platelets by anticoagulation protocol: Yes   Plan:  Coumadin 0.5 mg today x 1  PT-INR daily Monitor for S/S of bleeding  Beverlee Nims, RPH  01/22/2017,8:11 AM

## 2017-01-22 NOTE — Progress Notes (Signed)
Patient is still having difficulty understanding how to properly use the Incentive spirometer and she cannot generate an adequate exhalation to truly benefit from the Flutter valve. While she does appear to give her best effort she seems to not comprehend the instructions for the IS and gets a little anxious and  frustrated with her self. She's stated " I just can't get it right." I have reviewed the use and benefit of deep breathing and cough ( without use of device) with the patient and one of her daughters.

## 2017-01-22 NOTE — Progress Notes (Signed)
Pt sats remained 99-100 throughout night, removed nonrebreather and placed pt back on 8L nasal cannula, will provide saline spray as pt is c/o nasal dryness. Sats currently 96-99, pt in no distress. Will continue to monitor.

## 2017-01-22 NOTE — Progress Notes (Signed)
Patient had removed oxygen mask and became extremely short of breath. Saturation dropped down to 67 on room air.

## 2017-01-23 ENCOUNTER — Inpatient Hospital Stay (HOSPITAL_COMMUNITY): Payer: Medicare Other

## 2017-01-23 LAB — BASIC METABOLIC PANEL
ANION GAP: 10 (ref 5–15)
BUN: 22 mg/dL — ABNORMAL HIGH (ref 6–20)
CHLORIDE: 98 mmol/L — AB (ref 101–111)
CO2: 27 mmol/L (ref 22–32)
CREATININE: 0.77 mg/dL (ref 0.44–1.00)
Calcium: 8.9 mg/dL (ref 8.9–10.3)
GFR calc non Af Amer: >60 mL/min (ref >60)
Glucose, Bld: 140 mg/dL — ABNORMAL HIGH (ref 65–99)
POTASSIUM: 4.5 mmol/L (ref 3.5–5.1)
SODIUM: 135 mmol/L (ref 135–145)

## 2017-01-23 LAB — PROTIME-INR
INR: 3.29
Prothrombin Time: 33.2 seconds — ABNORMAL HIGH (ref 11.4–15.2)

## 2017-01-23 LAB — MRSA PCR SCREENING: MRSA BY PCR: NEGATIVE

## 2017-01-23 LAB — CBC
HCT: 26.2 % — ABNORMAL LOW (ref 36.0–46.0)
HEMOGLOBIN: 8.7 g/dL — AB (ref 12.0–15.0)
MCH: 29.7 pg (ref 26.0–34.0)
MCHC: 33.2 g/dL (ref 30.0–36.0)
MCV: 89.4 fL (ref 78.0–100.0)
PLATELETS: 419 10*3/uL — AB (ref 150–400)
RBC: 2.93 MIL/uL — AB (ref 3.87–5.11)
RDW: 16.4 % — ABNORMAL HIGH (ref 11.5–15.5)
WBC: 12.9 10*3/uL — AB (ref 4.0–10.5)

## 2017-01-23 MED ORDER — PANTOPRAZOLE SODIUM 40 MG PO TBEC
40.0000 mg | DELAYED_RELEASE_TABLET | Freq: Two times a day (BID) | ORAL | Status: DC
  Administered 2017-01-23 – 2017-01-29 (×12): 40 mg via ORAL
  Filled 2017-01-23 (×13): qty 1

## 2017-01-23 MED ORDER — SODIUM CHLORIDE 0.9% FLUSH: 10.0000 mL | INTRAVENOUS | Status: DC | PRN

## 2017-01-23 MED ORDER — IPRATROPIUM BROMIDE 0.02 % IN SOLN
0.5000 mg | Freq: Three times a day (TID) | RESPIRATORY_TRACT | Status: DC
Start: 2017-01-24 — End: 2017-01-31
  Administered 2017-01-24 – 2017-01-31 (×22): 0.5 mg via RESPIRATORY_TRACT
  Filled 2017-01-23 (×23): qty 2.5

## 2017-01-23 MED ORDER — SODIUM CHLORIDE 0.9% FLUSH
10.0000 mL | Freq: Two times a day (BID) | INTRAVENOUS | Status: DC
Start: 2017-01-23 — End: 2017-02-02
  Administered 2017-01-23 – 2017-02-02 (×18): 10 mL
  Administered 2017-02-02: 20 mL

## 2017-01-23 MED ORDER — CHLORHEXIDINE GLUCONATE CLOTH 2 % EX PADS
6.0000 | MEDICATED_PAD | Freq: Every day | CUTANEOUS | Status: DC
  Administered 2017-01-24: 6 via TOPICAL

## 2017-01-23 MED ORDER — LEVALBUTEROL HCL 1.25 MG/0.5ML IN NEBU
1.2500 mg | INHALATION_SOLUTION | Freq: Three times a day (TID) | RESPIRATORY_TRACT | Status: DC
  Administered 2017-01-24 – 2017-01-31 (×22): 1.25 mg via RESPIRATORY_TRACT
  Filled 2017-01-23 (×24): qty 0.5

## 2017-01-23 NOTE — Progress Notes (Signed)
Pt Oxygen sats dropped with talking and discussion of care. Pt became SOB and needing breaks to speak. O2 at 10L. Continue to monitor.

## 2017-01-23 NOTE — Progress Notes (Signed)
Called back after earlier check as patient had dropped Saturation after removing oxygen because of nose being stuffy. Placed patient on neb treatment , changed to Partial non rebreathing mask. Saturation 95 on 9 liters partial mask.

## 2017-01-23 NOTE — Progress Notes (Addendum)
PROGRESS NOTE    Alexandria Chen  OAC:[id number] DOB: 1935-05-02 DOA: 01/20/2017 PCP: [name], FNP     Brief Narrative:  82 year old woman admitted from home on 9/14 due to shortness of breath. She has a history of atrial fibrillation, anxiety, depression, hypertension and lung cancer who recently finished chemotherapy and radiation. She was recently treated for pneumonia in August. Came in due to continued weakness and difficulty breathing with ambulation. 911 was activated and patient was found to be hypoxic and was brought into the hospital for evaluation.   Assessment & Plan:   Principal Problem:   Hypoxia Active Problems:   Depression   Essential hypertension, benign   Hyperlipidemia   Atrial fibrillation (HCC) [I48.91]   HCAP (healthcare-associated pneumonia)   Hypokalemia   Bronchospasm   Acute hypoxemic respiratory failure -Believed to be multifactorial: Healthcare associated pneumonia, acute diastolic dysfunction, COPD (not formally diagnosed but does have an extensive smoking history and significant wheezing on exam), also history of lung cancer with completion of radiation and chemotherapy as of July 2018. As discussed via phone with pulmonary, Dr. Titus Mould, on 9/22 after he has reviewed images, he believes she may also have a component of radiation pneumonitis. He recommended increasing steroid dose and re-addition of Lasix which I have. Over past 24 hours patient has shown no signs of improvement and in fact has worsened now with increased oxygen requirements up to 100% nonrebreather mask. Repeat chest x-ray has been performed and although not formally read yet, to me looks worsened with increased airspace disease especially on the right. -Has completed antibiotics and they have been discontinued as of 9/22. -Continue steroids, nebs, incentive spirometry with flutter valve.  -She currently appears euvolemic. However per Dr. Titus Mould recommendations, have resumed  Lasix.  Depression with anxiety -No suicidal or homicidal ideation. -Continue Cymbalta, BuSpar  Hypertension Stable, no longer hypotensive  Hyperlipidemia -Continue statin   paroxysmal atrial fibrillation -Anticoagulated on Coumadin, currently rate controlled.  GERD -Continue PPI  Coronary artery disease -Stable, no chest pain  Chronic pain syndrome -Continue Neurontin   DVT prophylaxis: Anticoagulated on Coumadin Code Status: Full Code Family Communication: Discussed at length with 2 daughters at bedside Disposition Plan: As discussed via phone with Dr. Titus Mould, will go ahead and transfer to ICU at [facility] for continued pulmonary care.  Consultants:   Pulmonary  Procedures:   None   Antimicrobials:  Anti-infectives    Start     Dose/Rate Route Frequency Ordered Stop   01/20/17 1000  vancomycin (VANCOCIN) IVPB 750 mg/150 ml premix  Status:  Discontinued     750 mg 150 mL/hr over 60 Minutes Intravenous Every 12 hours 01/20/17 0925 01/22/17 1249   01/17/17 1800  vancomycin (VANCOCIN) 500 mg in sodium chloride 0.9 % 100 mL IVPB  Status:  Discontinued     500 mg 100 mL/hr over 60 Minutes Intravenous Every 12 hours 01/17/17 0934 01/20/17 0925   01/17/17 0700  vancomycin (VANCOCIN) IVPB 1000 mg/200 mL premix     1,000 mg 200 mL/hr over 60 Minutes Intravenous  Once 01/17/17 0650 01/17/17 0800   01/16/17 0000  cefpodoxime (VANTIN) 200 MG tablet     200 mg Oral 2 times daily 01/16/17 0841 01/23/17 2359   01/15/17 0600  ceFEPIme (MAXIPIME) 1 g in dextrose 5 % 50 mL IVPB  Status:  Discontinued     1 g 100 mL/hr over 30 Minutes Intravenous Every 24 hours 01/17/2017 0819 01/22/17 1249   01/25/2017 0530  cefTRIAXone (  ROCEPHIN) 1 g in dextrose 5 % 50 mL IVPB  Status:  Discontinued     1 g 100 mL/hr over 30 Minutes Intravenous  Once 01/16/2017 0518 01/24/2017 0526   01/15/2017 0530  ceFEPIme (MAXIPIME) 2 g in dextrose 5 % 50 mL IVPB     2 g 100 mL/hr over 30 Minutes Intravenous  Once  01/05/2017 0526 01/24/2017 8756       Subjective: Sitting up in chair, states breathing is worse, cannot speak in full sentences  Objective: Vitals:   01/23/17 0239 01/23/17 0500 01/23/17 0601 01/23/17 0734  BP:   123/60   Pulse:   79   Resp:   18   Temp:   (!) 97.5 F (36.4 C)   TempSrc:   Oral   SpO2: 95%  95% 94%  Weight:  60.2 kg (132 lb 13 oz)    Height:        Intake/Output Summary (Last 24 hours) at 01/23/17 1145 Last data filed at 01/23/17 4332  Gross per 24 hour  Intake              480 ml  Output             2200 ml  Net            -1720 ml   Filed Weights   01/21/17 0447 01/22/17 0520 01/23/17 0500  Weight: 60.8 kg (134 lb 0.6 oz) 60.2 kg (132 lb 12.8 oz) 60.2 kg (132 lb 13 oz)    Examination:  General exam: Alert, awake, oriented x 3 Respiratory system: Bilateral dry crackles Cardiovascular system:RRR. No murmurs, rubs, gallops. Gastrointestinal system: Abdomen is nondistended, soft and nontender. No organomegaly or masses felt. Normal bowel sounds heard. Central nervous system: Alert and oriented. No focal neurological deficits. Extremities: No C/C/E, +pedal pulses Skin: No rashes, lesions or ulcers Psychiatry: Judgement and insight appear normal. Mood & affect appropriate.        Data Reviewed: I have personally reviewed following labs and imaging studies  CBC:  Recent Labs Lab 01/19/17 0234 01/20/17 0649 01/23/17 0612  WBC 12.3* 11.8* 12.9*  HGB 8.0* 9.2* 8.7*  HCT 24.3* 28.2* 26.2*  MCV 89.3 90.4 89.4  PLT 510* 498* 951*   Basic Metabolic Panel:  Recent Labs Lab 01/17/17 0624 01/18/17 0619 01/19/17 0234 01/20/17 0649 01/21/17 0502  NA 138 136 135 138 135  K 4.3 4.1 4.4 5.5* 5.0  CL 102 100* 102 102 101  CO2 28 28 27 28 28   GLUCOSE 103* 103* 94 134* 136*  BUN 20 23* 24* 23* 22*  CREATININE 0.86 0.84 0.86 0.80 0.81  CALCIUM 9.1 8.9 8.8* 9.3 9.1   GFR: Estimated Creatinine Clearance: 45.8 mL/min (by C-G formula based on SCr  of 0.81 mg/dL). Liver Function Tests:  Recent Labs Lab 01/17/17 0624  AST 32  ALT 36  ALKPHOS 70  BILITOT 0.5  PROT 6.7  ALBUMIN 2.3*   No results for input(s): LIPASE, AMYLASE in the last 168 hours. No results for input(s): AMMONIA in the last 168 hours. Coagulation Profile:  Recent Labs Lab 01/19/17 0234 01/20/17 0649 01/21/17 0502 01/22/17 0625 01/23/17 0612  INR 2.46 2.29 2.81 3.11 3.29   Cardiac Enzymes:  Recent Labs Lab 01/18/17 1151 01/18/17 1946 01/19/17 0234  TROPONINI <0.03 <0.03 <0.03   BNP (last 3 results) No results for input(s): PROBNP in the last 8760 hours. HbA1C: No results for input(s): HGBA1C in the last 72 hours. CBG:  Recent  Labs Lab 01/18/17 1143  GLUCAP 123*   Lipid Profile: No results for input(s): CHOL, HDL, LDLCALC, TRIG, CHOLHDL, LDLDIRECT in the last 72 hours. Thyroid Function Tests: No results for input(s): TSH, T4TOTAL, FREET4, T3FREE, THYROIDAB in the last 72 hours. Anemia Panel: No results for input(s): VITAMINB12, FOLATE, FERRITIN, TIBC, IRON, RETICCTPCT in the last 72 hours. Urine analysis:    Component Value Date/Time   COLORURINE YELLOW 11/27/2016 1400   APPEARANCEUR CLEAR 11/27/2016 1400   APPEARANCEUR Clear 09/10/2016 1114   LABSPEC 1.010 11/27/2016 1400   PHURINE 6.0 11/27/2016 1400   GLUCOSEU NEGATIVE 11/27/2016 1400   HGBUR NEGATIVE 11/27/2016 1400   BILIRUBINUR NEGATIVE 11/27/2016 1400   BILIRUBINUR Negative 09/10/2016 1114   KETONESUR NEGATIVE 11/27/2016 1400   PROTEINUR NEGATIVE 11/27/2016 1400   UROBILINOGEN 0.2 10/14/2011 1132   NITRITE NEGATIVE 11/27/2016 1400   LEUKOCYTESUR NEGATIVE 11/27/2016 1400   LEUKOCYTESUR Negative 09/10/2016 1114   Sepsis Labs: @LABRCNTIP (procalcitonin:4,lacticidven:4)  ) Recent Results (from the past 240 hour(s))  Culture, blood (routine x 2) Call MD if unable to obtain prior to antibiotics being given     Status: None   Collection Time: 01/04/2017  7:28 AM  Result  Value Ref Range Status   Specimen Description BLOOD RIGHT ARM  Final   Special Requests   Final    BOTTLES DRAWN AEROBIC AND ANAEROBIC Blood Culture adequate volume   Culture NO GROWTH 5 DAYS  Final   Report Status 01/19/2017 FINAL  Final  Culture, blood (routine x 2) Call MD if unable to obtain prior to antibiotics being given     Status: None   Collection Time: 01/05/2017  7:33 AM  Result Value Ref Range Status   Specimen Description BLOOD RIGHT WRIST  Final   Special Requests   Final    BOTTLES DRAWN AEROBIC AND ANAEROBIC Blood Culture adequate volume   Culture NO GROWTH 5 DAYS  Final   Report Status 01/19/2017 FINAL  Final         Radiology Studies: No results found.      Scheduled Meds: . amiodarone  200 mg Oral Daily  . aspirin EC  81 mg Oral Daily  . atorvastatin  40 mg Oral q1800  . busPIRone  15 mg Oral BID  . DULoxetine  60 mg Oral Daily  . furosemide  40 mg Intravenous Daily  . gabapentin  100 mg Oral QHS  . guaiFENesin  600 mg Oral BID  . ipratropium  0.5 mg Nebulization Q6H WA  . levalbuterol  1.25 mg Nebulization Q6H WA  . magic mouthwash  5 mL Oral TID PC & HS  . methylPREDNISolone (SOLU-MEDROL) injection  80 mg Intravenous Q12H  . pantoprazole  40 mg Oral Daily  . Warfarin - Pharmacist Dosing Inpatient   Does not apply q1800   Continuous Infusions:    LOS: 7 days    Time spent: 45 minutes . Greater than 50% of this time was spent in direct contact with the patient coordinating care.     Lelon Frohlich, MD [facility] Pager [phone]  If 7PM-7AM, please contact night-coverage [email or web address] Password [email or web address] 01/23/2017, 11:45 AM

## 2017-01-23 NOTE — Progress Notes (Signed)
Patient sleeping lying on rt side appears comfortable. No resp distress.

## 2017-01-23 NOTE — Progress Notes (Signed)
Report called and given to Manuela Schwartz, RN on 4N. Pt is going to room 31. [facility] present to transport pt. Belongings sent with family.

## 2017-01-23 NOTE — H&P (Signed)
PULMONARY / CRITICAL CARE MEDICINE   Name: Alexandria Chen MRN: [id number] DOB: 20-Feb-1935    ADMISSION DATE:  01/26/2017 CONSULTATION DATE:  01/23/17  REFERRING MD:  Dr. 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 She is completed 8 days of vancomycin and cefepime. She reports that she feels better. She has shortness of breath and dyspnea on exertion and dizziness when she stands up.  PAST MEDICAL HISTORY :  She  has a past medical history of A-fib ([place]); Alcohol abuse, in remission; Anxiety; Atrial fibrillation ([place]); Depression; Dyspnea; Dysrhythmia; History of bronchitis; HTN (hypertension); Mass of right lung (07/13/2016); Osteopenia; and Squamous cell carcinoma of right lung ([place]) (07/13/2016).  PAST SURGICAL HISTORY: She  has a past surgical history that includes IR FLUORO GUIDE PORT INSERTION RIGHT (09/17/2016) and IR US Guide Vasc Access Right (09/17/2016).  No Known Allergies  No current facility-administered medications on file prior to encounter.    Current Outpatient Prescriptions on File Prior to Encounter  Medication Sig  . acetaminophen (TYLENOL) 325 MG tablet Take 2 tablets (650 mg total) by mouth every 4 (four) hours as needed for mild pain, moderate pain, fever or headache.  Marland Kitchen amiodarone (PACERONE) 200 MG tablet Take 200 mg by mouth daily.   Marland Kitchen aspirin EC 81 MG tablet Take 81 mg by mouth daily.  Marland Kitchen atorvastatin (LIPITOR) 40 MG tablet TAKE 1 TABLET (40 MG TOTAL) BY MOUTH DAILY. FOR HYPERLIPIDEMIA  . busPIRone (BUSPAR) 15 MG tablet Take 1 tablet (15 mg total) by mouth 2 (two) times daily.  . DULoxetine (CYMBALTA) 60 MG capsule Take 1 capsule (60 mg total) by mouth  daily.  . fish oil-omega-3 fatty acids 1000 MG capsule Take 3 capsules (3 g total) by mouth daily. For hyperlipidemia. (Patient taking differently: Take 1 g by mouth 3 (three) times daily. For hyperlipidemia.)  . fluticasone (FLONASE) 50 MCG/ACT nasal spray Place 2 sprays into both nostrils 2 (two) times daily. Use as needed (Patient taking differently: Place 2 sprays into both nostrils 2 (two) times daily as needed for allergies. Use as needed)  . gabapentin (NEURONTIN) 100 MG capsule Take 1 capsule (100 mg total) by mouth at bedtime.  . nitroGLYCERIN (NITROSTAT) 0.4 MG SL tablet Place 0.4 mg under the tongue.  Marland Kitchen omeprazole (PRILOSEC) 40 MG capsule Take 1 capsule (40 mg total) by mouth daily.  Marland Kitchen warfarin (COUMADIN) 2.5 MG tablet Take 3 mg by mouth daily.     FAMILY HISTORY:  Her indicated that her mother is deceased. She indicated that her father is deceased. She indicated that both of her sisters are deceased. She indicated that both of her brothers are deceased. She indicated that her maternal grandmother is deceased. She indicated that her maternal grandfather is deceased. She indicated that her paternal grandmother is deceased. She indicated that her paternal grandfather is deceased. She indicated that the status of her neg hx is unknown.    SOCIAL HISTORY: She  reports that she quit smoking about 6 months ago. Her smoking use included Cigarettes. She started smoking about 60 years ago. She has a 120.00 pack-year smoking history. She has never used smokeless tobacco. She reports that she does not drink alcohol or use  drugs.  REVIEW OF SYSTEMS:   No cough, no fever, no sputum, no hemoptysis. All other systems reviewed and negative  SUBJECTIVE:  As above  VITAL SIGNS: BP 117/61 (BP Location: Left Arm)   Pulse 79   Temp 97.8 F (36.6 C) (Oral)   Resp (!) 25   Ht 5\' 3"  (1.6 m)   Wt 132 lb 7.9 oz (60.1 kg)   SpO2 95%   BMI 23.47 kg/m   HEMODYNAMICS:    VENTILATOR SETTINGS:     INTAKE / OUTPUT: I/O last 3 completed shifts: In: 480 [P.O.:480] Out: 3151 [Urine:3150; Stool:1]  PHYSICAL EXAMINATION: General:  No acute distress Neuro:  Conscious alert and oriented 2 moves all extremities HEENT:  ATNC, EOMI, PERRLA, MO MM Cardiovascular:  Regular rate and rhythm Lungs:  Clear to auscultation on the left coarse on the right no wheezing rales rhonchi Abdomen:  Soft nontender nondistended Musculoskeletal:  No red warm swollen tender joints Skin:  Warm, dry, no rash  LABS:  BMET  Recent Labs Lab 01/20/17 0649 01/21/17 0502 01/23/17 0612  NA 138 135 135  K 5.5* 5.0 4.5  CL 102 101 98*  CO2 28 28 27   BUN 23* 22* 22*  CREATININE 0.80 0.81 0.77  GLUCOSE 134* 136* 140*    Electrolytes  Recent Labs Lab 01/20/17 0649 01/21/17 0502 01/23/17 0612  CALCIUM 9.3 9.1 8.9    CBC  Recent Labs Lab 01/19/17 0234 01/20/17 0649 01/23/17 0612  WBC 12.3* 11.8* 12.9*  HGB 8.0* 9.2* 8.7*  HCT 24.3* 28.2* 26.2*  PLT 510* 498* 419*    Coag's  Recent Labs Lab 01/21/17 0502 01/22/17 0625 01/23/17 0612  INR 2.81 3.11 3.29    Sepsis Markers  Recent Labs Lab 01/17/17 0624 01/19/17 0234 01/21/17 0502  PROCALCITON 0.22 0.26 0.12    ABG  Recent Labs Lab 01/21/17 1417  PHART 7.418  PCO2ART 39.7  PO2ART 57.3*    Liver Enzymes  Recent Labs Lab 01/17/17 0624  AST 32  ALT 36  ALKPHOS 70  BILITOT 0.5  ALBUMIN 2.3*    Cardiac Enzymes  Recent Labs Lab 01/18/17 1151 01/18/17 1946 01/19/17 0234  TROPONINI <0.03 <0.03 <0.03    Glucose  Recent Labs Lab 01/18/17 1143  GLUCAP 123*    Imaging Dg Chest Port 1 View  Result Date: 01/23/2017 CLINICAL DATA:  Hypoxia EXAM: PORTABLE CHEST 1 VIEW COMPARISON:  01/16/2017 FINDINGS: Cardiac shadow is stable. Right chest wall port is again seen. Diffuse interstitial changes are noted throughout both lungs new from the prior exam likely representing a component of pulmonary edema. No  sizable effusion or infiltrate is seen. IMPRESSION: Increasing interstitial changes likely related to pulmonary edema. Electronically Signed   By: Inez Catalina M.D.   On: 01/23/2017 11:53     STUDIES:  Outpatient CT chest 9/12 shows diffuse new groundglass opacities throughout right upper lobe, new since 1 month prior Chest x-ray 9/23 shows worsening and now bilateral diffuse interstitial infiltrates, radiologist read is likely related to pulmonary edema.  CULTURES: Blood culture is negative  ANTIBIOTICS: Completed vancomycin and cefepime 8 days  SIGNIFICANT EVENTS: 9/14 admission to hospital for shortness of breath 9/23 Transferred to [facility] ICU  LINES/TUBES: Right port  DISCUSSION: I think primary differential is radiation pneumonitis, given that infiltrates began in the right upper lobe before progressing. Amiodarone toxicity is also at differential. Healthcare associated pneumonia is present on admission was unlikely the cause. She has received a complete course of treatment,  she is still symptomatic and her chest x-ray is worsening. Pro-calcitonin is do not support HCAP. Course is too protracted for ARDS. Extravascular lung water/pulmonary edema likely a component whether or not it is due to cardiogenic source is unclear but seems less likely. Additional causes of interstitial lung diseases are possible such as eosinophilic pneumonia etc. are possible but less likely.  ASSESSMENT / PLAN:  PULMONARY A: Acute hypoxemic respiratory failure Concern for radiation induced lung injury P:   Continue with supplemental oxygen to maintain saturations greater than 88% Continue with steroids nebs  CARDIOVASCULAR A:  Atrial fibrillation P:  Atorvastatin, furosemide, aspirin, nitroglycerin Telemetry Warfarin per pharmacy  RENAL A:   No active issues P:   Monitor renal function  GASTROINTESTINAL A:   GERD P:   PPI  HEMATOLOGIC A:   DVT prophylaxis P:  On therapy  warfarin  INFECTIOUS A:   No suspected infection P:   Completed antibiotics  ENDOCRINE A:  No active issue P:   Monitor glucose on chemistry is  NEUROLOGIC A:   No active issue P:   RASS goal: 0    FAMILY  - Updates: Daughter At bedside on admission  - Inter-disciplinary family meet or Palliative Care meeting due by:  01/30/17   Upon my evaluation, this patient had a high probability of imminent or life-threatening deterioration due to respiratory failure  high complexity decision making to assess, manipulate, and support vital organ system failure including respiratory failure  I have personally provided 30 minutes of critical care time exclusive of time spent on separately billable procedures and education. Time includes review and summation of previous medical record, laboratory data, radiology results, independent review of chest x-ray, CT chest, and monitoring for potential decompensation. Interventions were performed as documented above.  I spent a total of 5 minutes of face to face time in clinical consultation, greater than 50% of which was counseling/coordinating care for respiratory failure  Condition: Critical Prognosis: Guarded Code Status: Full  Charlesetta Garibaldi, [place] Pager: [phone]   01/23/2017, 11:12 PM

## 2017-01-23 NOTE — Progress Notes (Signed)
ANTICOAGULATION CONSULT NOTE  Pharmacy Consult for Coumadin Indication: atrial fibrillation  No Known Allergies  Patient Measurements: Height: 5\' 3"  (160 cm) Weight: 132 lb 13 oz (60.2 kg) IBW/kg (Calculated) : 52.4   Vital Signs: Temp: 97.5 F (36.4 C) (09/23 0601) Temp Source: Oral (09/23 0601) BP: 123/60 (09/23 0601) Pulse Rate: 79 (09/23 0601)  Labs:  Recent Labs  01/21/17 0502 01/22/17 0625 01/23/17 0612  HGB  --   --  8.7*  HCT  --   --  26.2*  PLT  --   --  419*  LABPROT 29.4* 31.8* 33.2*  INR 2.81 3.11 3.29  CREATININE 0.81  --   --     Estimated Creatinine Clearance: 45.8 mL/min (by C-G formula based on SCr of 0.81 mg/dL).   Medical History: Past Medical History:  Diagnosis Date  . A-fib ([place])   . Alcohol abuse, in remission   . Anxiety   . Atrial fibrillation ([place])   . Depression   . Dyspnea    walking distances   . Dysrhythmia   . History of bronchitis   . HTN (hypertension)   . Mass of right lung 07/13/2016  . Osteopenia   . Squamous cell carcinoma of right lung ([place]) 07/13/2016    Medications:  Prescriptions Prior to Admission  Medication Sig Dispense Refill Last Dose  . acetaminophen (TYLENOL) 325 MG tablet Take 2 tablets (650 mg total) by mouth every 4 (four) hours as needed for mild pain, moderate pain, fever or headache. 100 tablet 1 Past Week at Unknown time  . amiodarone (PACERONE) 200 MG tablet Take 200 mg by mouth daily.    01/13/2017 at Unknown time  . aspirin EC 81 MG tablet Take 81 mg by mouth daily.   01/13/2017 at Unknown time  . atorvastatin (LIPITOR) 40 MG tablet TAKE 1 TABLET (40 MG TOTAL) BY MOUTH DAILY. FOR HYPERLIPIDEMIA 90 tablet 1 01/13/2017 at Unknown time  . busPIRone (BUSPAR) 15 MG tablet Take 1 tablet (15 mg total) by mouth 2 (two) times daily. 180 tablet 2 01/13/2017 at Unknown time  . DULoxetine (CYMBALTA) 60 MG capsule Take 1 capsule (60 mg total) by mouth daily. 30 capsule 2 01/13/2017 at Unknown time  . fish  oil-omega-3 fatty acids 1000 MG capsule Take 3 capsules (3 g total) by mouth daily. For hyperlipidemia. (Patient taking differently: Take 1 g by mouth 3 (three) times daily. For hyperlipidemia.) 30 capsule  01/13/2017 at Unknown time  . fluticasone (FLONASE) 50 MCG/ACT nasal spray Place 2 sprays into both nostrils 2 (two) times daily. Use as needed (Patient taking differently: Place 2 sprays into both nostrils 2 (two) times daily as needed for allergies. Use as needed) 16 g 6 unknown  . gabapentin (NEURONTIN) 100 MG capsule Take 1 capsule (100 mg total) by mouth at bedtime. 90 capsule 2 01/13/2017 at Unknown time  . nitroGLYCERIN (NITROSTAT) 0.4 MG SL tablet Place 0.4 mg under the tongue.   unknown  . omeprazole (PRILOSEC) 40 MG capsule Take 1 capsule (40 mg total) by mouth daily. 30 capsule 2 01/13/2017 at Unknown time  . warfarin (COUMADIN) 2.5 MG tablet Take 3 mg by mouth daily.    01/13/2017 at 0730  . [DISCONTINUED] HYDROcodone-homatropine (HYCODAN) 5-1.5 MG/5ML syrup Take 5 mLs by mouth every 6 (six) hours as needed for cough.   01/13/2017 at Unknown time    Assessment: 82 y.o. female  with past medical history of atrial fibrillation,   Pharmacy asked to dose Coumadin.  Patient is followed by anticoag clinic and takes 3mg  on Sunday and Thursday  And 1.5mg  On Monday, Tuesday, Wednesday, Friday and Saturday. INR >3 this am  Goal of Therapy:  INR 2-3 Monitor platelets by anticoagulation protocol: Yes   Plan:   No coumadin today PT-INR daily Monitor for S/S of bleeding  Kimberley Dastrup Poteet, RPH  01/23/2017,8:50 AM

## 2017-01-23 NOTE — Progress Notes (Signed)
eLink Physician-Brief Progress Note Patient Name: Alexandria Chen DOB: 09-Sep-1934 MRN: [id number]   Date of Service  01/23/2017  HPI/Events of Note  Acute resp failure/ on amiodarone with bnp nl 9/17 and progressive diffuse infiltrates Looks comfortable at 30 degrees on camera on nasal 02   eICU Interventions  Check esr/ d/c amiodarone/ pccm to see for formal transfer note      Intervention Category Major Interventions: Respiratory failure - evaluation and management  Christinia Gully 01/23/2017, 4:42 PM

## 2017-01-24 LAB — BASIC METABOLIC PANEL
Anion gap: 5 (ref 5–15)
BUN: 25 mg/dL — AB (ref 6–20)
CALCIUM: 8.9 mg/dL (ref 8.9–10.3)
CHLORIDE: 100 mmol/L — AB (ref 101–111)
CO2: 31 mmol/L (ref 22–32)
CREATININE: 0.78 mg/dL (ref 0.44–1.00)
GFR calc Af Amer: >60 mL/min (ref >60)
GFR calc non Af Amer: >60 mL/min (ref >60)
GLUCOSE: 135 mg/dL — AB (ref 65–99)
Potassium: 4.6 mmol/L (ref 3.5–5.1)
SODIUM: 136 mmol/L (ref 135–145)

## 2017-01-24 LAB — SEDIMENTATION RATE: Sed Rate: 125 mm/hr — ABNORMAL HIGH (ref 0–22)

## 2017-01-24 LAB — PROTIME-INR
INR: 2.83
PROTHROMBIN TIME: 29.5 s — AB (ref 11.4–15.2)

## 2017-01-24 MED ORDER — FUROSEMIDE 10 MG/ML IJ SOLN
40.0000 mg | Freq: Two times a day (BID) | INTRAMUSCULAR | Status: DC
  Administered 2017-01-24 – 2017-01-25 (×2): 40 mg via INTRAVENOUS
  Filled 2017-01-24 (×2): qty 4

## 2017-01-24 MED ORDER — WARFARIN SODIUM 1 MG PO TABS
1.5000 mg | ORAL_TABLET | Freq: Once | ORAL | Status: AC
  Administered 2017-01-24: 1.5 mg via ORAL
  Filled 2017-01-24: qty 1

## 2017-01-24 NOTE — Progress Notes (Signed)
PULMONARY / CRITICAL CARE MEDICINE   Name: Alexandria Chen MRN: [id number] DOB: 24-Jun-1934    ADMISSION DATE:  01/09/2017 CONSULTATION DATE:  01/23/17  REFERRING MD:  Dr. 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 She is completed 8 days of vancomycin and cefepime. She reports that she feels better. She has shortness of breath and dyspnea on exertion and dizziness when she stands up.  PAST MEDICAL HISTORY :  She  has a past medical history of A-fib ([place]); Alcohol abuse, in remission; Anxiety; Atrial fibrillation ([place]); Depression; Dyspnea; Dysrhythmia; History of bronchitis; HTN (hypertension); Mass of right lung (07/13/2016); Osteopenia; and Squamous cell carcinoma of right lung ([place]) (07/13/2016).  PAST SURGICAL HISTORY: She  has a past surgical history that includes IR FLUORO GUIDE PORT INSERTION RIGHT (09/17/2016) and IR US Guide Vasc Access Right (09/17/2016).  No Known Allergies  No current facility-administered medications on file prior to encounter.    Current Outpatient Prescriptions on File Prior to Encounter  Medication Sig  . acetaminophen (TYLENOL) 325 MG tablet Take 2 tablets (650 mg total) by mouth every 4 (four) hours as needed for mild pain, moderate pain, fever or headache.  Marland Kitchen amiodarone (PACERONE) 200 MG tablet Take 200 mg by mouth daily.   Marland Kitchen aspirin EC 81 MG tablet Take 81 mg by mouth daily.  Marland Kitchen atorvastatin (LIPITOR) 40 MG tablet TAKE 1 TABLET (40 MG TOTAL) BY MOUTH DAILY. FOR HYPERLIPIDEMIA  . busPIRone (BUSPAR) 15 MG tablet Take 1 tablet (15 mg total) by mouth 2 (two) times daily.  . DULoxetine (CYMBALTA) 60 MG capsule Take 1 capsule (60 mg total) by  mouth daily.  . fish oil-omega-3 fatty acids 1000 MG capsule Take 3 capsules (3 g total) by mouth daily. For hyperlipidemia. (Patient taking differently: Take 1 g by mouth 3 (three) times daily. For hyperlipidemia.)  . fluticasone (FLONASE) 50 MCG/ACT nasal spray Place 2 sprays into both nostrils 2 (two) times daily. Use as needed (Patient taking differently: Place 2 sprays into both nostrils 2 (two) times daily as needed for allergies. Use as needed)  . gabapentin (NEURONTIN) 100 MG capsule Take 1 capsule (100 mg total) by mouth at bedtime.  . nitroGLYCERIN (NITROSTAT) 0.4 MG SL tablet Place 0.4 mg under the tongue.  Marland Kitchen omeprazole (PRILOSEC) 40 MG capsule Take 1 capsule (40 mg total) by mouth daily.  Marland Kitchen warfarin (COUMADIN) 2.5 MG tablet Take 3 mg by mouth daily.     FAMILY HISTORY:  Her indicated that her mother is deceased. She indicated that her father is deceased. She indicated that both of her sisters are deceased. She indicated that both of her brothers are deceased. She indicated that her maternal grandmother is deceased. She indicated that her maternal grandfather is deceased. She indicated that her paternal grandmother is deceased. She indicated that her paternal grandfather is deceased. She indicated that the status of her neg hx is unknown.    SOCIAL HISTORY: She  reports that she quit smoking about 6 months ago. Her smoking use included Cigarettes. She started smoking about 60 years ago. She has a 120.00 pack-year smoking history. She has never used smokeless tobacco. She reports that she does not drink alcohol  or use drugs.  REVIEW OF SYSTEMS:   No cough, no fever, no sputum, no hemoptysis. All other systems reviewed and negative  SUBJECTIVE:  O2 weaned to 4 L high flow nasal cannula.  VITAL SIGNS: BP 133/74   Pulse 65   Temp 97.9 F (36.6 C)   Resp 16   Ht 5\' 3"  (1.6 m)   Wt 132 lb 7.9 oz (60.1 kg)   SpO2 91%   BMI 23.47 kg/m   HEMODYNAMICS:    VENTILATOR SETTINGS:     INTAKE / OUTPUT: I/O last 3 completed shifts: In: 720 [P.O.:720] Out: 2000 [Urine:2000]  PHYSICAL EXAMINATION: Gen:      No acute distress HEENT:  EOMI, sclera anicteric Neck:     No masses; no thyromegaly Lungs:    Clear to auscultation bilaterally; normal respiratory effort CV:         Regular rate and rhythm; no murmurs Abd:      + bowel sounds; soft, non-tender; no palpable masses, no distension Ext:    No edema; adequate peripheral perfusion Skin:      Warm and dry; no rash Neuro: alert and oriented x 3 Psych: normal mood and affect  LABS:  BMET  Recent Labs Lab 01/21/17 0502 01/23/17 0612 01/24/17 0746  NA 135 135 136  K 5.0 4.5 4.6  CL 101 98* 100*  CO2 28 27 31   BUN 22* 22* 25*  CREATININE 0.81 0.77 0.78  GLUCOSE 136* 140* 135*    Electrolytes  Recent Labs Lab 01/21/17 0502 01/23/17 0612 01/24/17 0746  CALCIUM 9.1 8.9 8.9    CBC  Recent Labs Lab 01/19/17 0234 01/20/17 0649 01/23/17 0612  WBC 12.3* 11.8* 12.9*  HGB 8.0* 9.2* 8.7*  HCT 24.3* 28.2* 26.2*  PLT 510* 498* 419*    Coag's  Recent Labs Lab 01/22/17 0625 01/23/17 0612 01/24/17 0746  INR 3.11 3.29 2.83    Sepsis Markers  Recent Labs Lab 01/19/17 0234 01/21/17 0502  PROCALCITON 0.26 0.12    ABG  Recent Labs Lab 01/21/17 1417  PHART 7.418  PCO2ART 39.7  PO2ART 57.3*    Liver Enzymes No results for input(s): AST, ALT, ALKPHOS, BILITOT, ALBUMIN in the last 168 hours.  Cardiac Enzymes  Recent Labs Lab 01/18/17 1151 01/18/17 1946 01/19/17 0234  TROPONINI <0.03 <0.03 <0.03    Glucose  Recent Labs Lab 01/18/17 1143  GLUCAP 123*    Imaging Dg Chest Port 1 View  Result Date: 01/23/2017 CLINICAL DATA:  Hypoxia EXAM: PORTABLE CHEST 1 VIEW COMPARISON:  01/16/2017 FINDINGS: Cardiac shadow is stable. Right chest wall port is again seen. Diffuse interstitial changes are noted throughout both lungs new from the prior exam likely representing a component of  pulmonary edema. No sizable effusion or infiltrate is seen. IMPRESSION: Increasing interstitial changes likely related to pulmonary edema. Electronically Signed   By: Inez Catalina M.D.   On: 01/23/2017 11:53     STUDIES:  Outpatient CT chest 9/12 shows diffuse new groundglass opacities throughout right upper lobe, new since 1 month prior Chest x-ray 9/23 shows worsening and now bilateral diffuse interstitial infiltrates, radiologist read is likely related to pulmonary edema.  CULTURES: Blood culture is negative  ANTIBIOTICS: vancomycin and cefepime 8 days 9/15 > 9/22  SIGNIFICANT EVENTS: 9/14 admission to hospital for shortness of breath 9/23 Transferred to [facility] ICU  LINES/TUBES: Right port  DISCUSSION: 82 year old with lung cancer status post chemoradiation admitted with hypoxic respiratory failure, with lung infiltrates. She has received  adequate treatment for HCAP. Her progression may be related to radiation pneumonitis or amiodarone toxicity. She likely has a component of volume overload.  ASSESSMENT / PLAN:  PULMONARY A: Acute hypoxemic respiratory failure Concern for radiation induced lung injury P:   Continue supplemental oxygen. Wean down as tolerated Continue steroids at current dose Nebulizers.  CARDIOVASCULAR A:  Atrial fibrillation P:  Amiodarone stopped on 9/23 Continue Atorvastatin, aspirin, nitroglycerin Increase Lasix to 40 mg IV every 12 Telemetry monitoring Continue warfarin anticoagulation.  RENAL A:   No active issues P:   Follow renal function   GASTROINTESTINAL A:   GERD P:   PPI  HEMATOLOGIC A:   DVT prophylaxis P:  On therapy warfarin  INFECTIOUS A:   No suspected infection P:   Completed antibiotics  ENDOCRINE A:  No active issue P:   Monitor glucose on BMET  NEUROLOGIC A:   No active issue P:   RASS goal: 0  FAMILY  - Updates: Patient and daughter updated at bedside 9/24 - Inter-disciplinary family  meet or Palliative Care meeting due by:  01/30/17   Stable for transfer to SDU and back to [facility]. PCCM will follow as consult.  Marshell Garfinkel MD [facility] Pager [phone] If no answer or after 3pm call: [phone] 01/24/2017, 10:07 AM

## 2017-01-24 NOTE — Progress Notes (Signed)
Patient was placed on [facility] by myself and Dominic Pea. Patient said she felt like she was falling through and became nonresponsive for approximately 2 minutes and her O2 sats dropped to 50%. During that time RT came in and increased her O2 from HFNC at 4L to 10L and then to 15L.  Patient became responsive and we transferred her back to bed. Patient's daughter who was in the room stated this had happened multiple times during their stay at [facility] when the patient got up. Will continue to monitor her.  Glenda Chroman RN

## 2017-01-24 NOTE — Progress Notes (Signed)
ANTICOAGULATION CONSULT NOTE  Pharmacy Consult for Coumadin Indication: atrial fibrillation  No Known Allergies  Patient Measurements: Height: 5\' 3"  (160 cm) Weight: 132 lb 7.9 oz (60.1 kg) IBW/kg (Calculated) : 52.4   Vital Signs: Temp: 98.7 F (37.1 C) (09/24 0400) Temp Source: Oral (09/24 0400) BP: 133/74 (09/24 0700) Pulse Rate: 65 (09/24 0700)  Labs:  Recent Labs  01/22/17 0625 01/23/17 0612 01/24/17 0746  HGB  --  8.7*  --   HCT  --  26.2*  --   PLT  --  419*  --   LABPROT 31.8* 33.2* 29.5*  INR 3.11 3.29 2.83  CREATININE  --  0.77  --     Estimated Creatinine Clearance: 46.4 mL/min (by C-G formula based on SCr of 0.77 mg/dL).  Assessment: 82 y.o. female continues on warfarin for history of afib. INR is therapeutic at 2.83. No new CBC today but no bleeding noted.   (PTA 3mg  SuTh, 1.5mg  MTuWFSa)  Goal of Therapy:  INR 2-3 Monitor platelets by anticoagulation protocol: Yes   Plan:   Warfarin 1.5mg  PO x 1 tonight Daily INR  Salome Arnt, PharmD, BCPS 01/24/2017 8:39 AM

## 2017-01-25 ENCOUNTER — Inpatient Hospital Stay (HOSPITAL_COMMUNITY): Payer: Medicare Other

## 2017-01-25 DIAGNOSIS — J9600 Acute respiratory failure, unspecified whether with hypoxia or hypercapnia: Secondary | ICD-10-CM

## 2017-01-25 LAB — TROPONIN I: Troponin I: <0.03 ng/mL (ref <0.03)

## 2017-01-25 LAB — BASIC METABOLIC PANEL
ANION GAP: 10 (ref 5–15)
BUN: 27 mg/dL — ABNORMAL HIGH (ref 6–20)
CO2: 28 mmol/L (ref 22–32)
CREATININE: 0.86 mg/dL (ref 0.44–1.00)
Calcium: 8.8 mg/dL — ABNORMAL LOW (ref 8.9–10.3)
Chloride: 95 mmol/L — ABNORMAL LOW (ref 101–111)
GFR calc non Af Amer: >60 mL/min (ref >60)
GLUCOSE: 172 mg/dL — AB (ref 65–99)
Potassium: 4.4 mmol/L (ref 3.5–5.1)
Sodium: 133 mmol/L — ABNORMAL LOW (ref 135–145)

## 2017-01-25 LAB — CBC
HCT: 27.5 % — ABNORMAL LOW (ref 36.0–46.0)
Hemoglobin: 8.7 g/dL — ABNORMAL LOW (ref 12.0–15.0)
MCH: 28.2 pg (ref 26.0–34.0)
MCHC: 31.6 g/dL (ref 30.0–36.0)
MCV: 89 fL (ref 78.0–100.0)
PLATELETS: 401 10*3/uL — AB (ref 150–400)
RBC: 3.09 MIL/uL — ABNORMAL LOW (ref 3.87–5.11)
RDW: 16.5 % — AB (ref 11.5–15.5)
WBC: 14.7 10*3/uL — AB (ref 4.0–10.5)

## 2017-01-25 LAB — PHOSPHORUS: PHOSPHORUS: 3.4 mg/dL (ref 2.5–4.6)

## 2017-01-25 LAB — MAGNESIUM: Magnesium: 2.3 mg/dL (ref 1.7–2.4)

## 2017-01-25 LAB — PROTIME-INR
INR: 2.74
PROTHROMBIN TIME: 28.8 s — AB (ref 11.4–15.2)

## 2017-01-25 MED ORDER — WARFARIN SODIUM 1 MG PO TABS
1.5000 mg | ORAL_TABLET | Freq: Once | ORAL | Status: AC
  Administered 2017-01-25: 1.5 mg via ORAL
  Filled 2017-01-25: qty 0.5
  Filled 2017-01-25: qty 1.5
  Filled 2017-01-25: qty 0.5

## 2017-01-25 MED ORDER — FUROSEMIDE 10 MG/ML IJ SOLN: 60.0000 mg | Freq: Two times a day (BID) | INTRAMUSCULAR | Status: DC

## 2017-01-25 MED ORDER — METOPROLOL TARTRATE 5 MG/5ML IV SOLN
5.0000 mg | Freq: Once | INTRAVENOUS | Status: AC
  Administered 2017-01-25: 5 mg via INTRAVENOUS

## 2017-01-25 MED ORDER — METHYLPREDNISOLONE SODIUM SUCC 125 MG IJ SOLR
60.0000 mg | Freq: Two times a day (BID) | INTRAMUSCULAR | Status: DC
Start: 2017-01-25 — End: 2017-01-27
  Administered 2017-01-25 – 2017-01-27 (×4): 60 mg via INTRAVENOUS
  Filled 2017-01-25 (×5): qty 2

## 2017-01-25 MED ORDER — DILTIAZEM HCL ER 90 MG PO CP12
90.0000 mg | ORAL_CAPSULE | Freq: Two times a day (BID) | ORAL | Status: DC
  Administered 2017-01-25 – 2017-01-29 (×9): 90 mg via ORAL
  Filled 2017-01-25 (×13): qty 1

## 2017-01-25 MED ORDER — METOPROLOL TARTRATE 5 MG/5ML IV SOLN
INTRAVENOUS | Status: AC
  Administered 2017-01-25: 5 mg via INTRAVENOUS
  Filled 2017-01-25: qty 5

## 2017-01-25 MED ORDER — METOPROLOL TARTRATE 25 MG PO TABS
25.0000 mg | ORAL_TABLET | Freq: Three times a day (TID) | ORAL | Status: DC
  Administered 2017-01-25 – 2017-01-26 (×3): 25 mg via ORAL
  Filled 2017-01-25 (×3): qty 1

## 2017-01-25 MED ORDER — POTASSIUM CHLORIDE CRYS ER 20 MEQ PO TBCR
20.0000 meq | EXTENDED_RELEASE_TABLET | Freq: Two times a day (BID) | ORAL | Status: DC
  Administered 2017-01-25 – 2017-01-27 (×6): 20 meq via ORAL
  Filled 2017-01-25 (×6): qty 1

## 2017-01-25 MED ORDER — FUROSEMIDE 10 MG/ML IJ SOLN
20.0000 mg | Freq: Once | INTRAMUSCULAR | Status: AC
  Administered 2017-01-25: 20 mg via INTRAVENOUS
  Filled 2017-01-25: qty 2

## 2017-01-25 NOTE — Progress Notes (Signed)
PROGRESS NOTE [facility]   Alexandria Chen   IPJ:[id number] DOB: April 27, 1935  DOA: 01/13/2017 PCP: Alexandria Balloon, FNP   Brief Narrative:  Alexandria Chen  Is a 82 year old woman admitted from home on 9/14 due to short of breath. She has history of atrial fibrillation, anxiety, depression hypertension and lung cancer recent finishing chemotherapy and radiation. She was recently treated for pneumonia back in August to [facility] on 9/14 with difficulty breathing and weakness. Patient was found to be hypoxic and was brought into the hospital for evaluation.She was started on antibiotics as there was concern of healthcare associated pneumonia, also was started on Lasix as there also was concern of acute diastolic dysfunction. Patient did not show signs of improvement and had increasing oxygen requirement up to 100% non-rebreather mask. Patient subsequently was transferred to [facility] given the lack of pulmonary service at [facility] and she was admitted to the ICU. Patient was continued on oxygen supplementation and was started on steroids subsequently transferred to [facility] for further management  Subjective: Patient seen and examined, with daughter at bedside.She has been the desatting with minimal movement down to low 60s. She is up to 12 L nasal cannula. Her weight is up 10 pounds since admission. Patient also developed tachycardia EKG shows A. Fib.  Assessment & Plan: Acute respiratory failure with hypoxia due to acute ILD, questionable component of radiation pneumonitis versus amiodarone toxicity Failure to improve, worsening over a week course Having episodes of syncope due to hypoxia  Continue steroids there is some component of fluid overload will continue Lasix may need a higher dose. Repeat chest x-ray in the morning Completed course of antibiotic due to concerns of pneumonia with Vancomycin and Cefepime  PCCM recommendations appreciated    Syncope due to hypoxia  with exertion  See above  Chronic diastolic HF  Weight is up - Lasix increase by pulmonary team  Continue to monitor - check Cr    A. Fib with RVR Patient was on amiodarone but due to possible toxicity this was discontinued We'll give 1 dose of metoprolol 5 mg IV We'll start Cardizem 90 mg twice a day A/c with warfarin  Cardiology consulted - Dr Einar Gip   Hypertension Stable  Chronic pain syndrome Continue Neurontin  Depression with anxiety No suicidal or homicidal ideation  Continue Cymbalta, BuSpar  DVT prophylaxis: Warfarin  Code Status: Full Code  Family Communication: Daughter at bedside  Disposition Plan: TBD - keep in SDU   Consultants:   Cardiology   PCCM   Procedures:   None   Antimicrobials: Anti-infectives    Start     Dose/Rate Route Frequency Ordered Stop   01/20/17 1000  vancomycin (VANCOCIN) IVPB 750 mg/150 ml premix  Status:  Discontinued     750 mg 150 mL/hr over 60 Minutes Intravenous Every 12 hours 01/20/17 0925 01/22/17 1249   01/17/17 1800  vancomycin (VANCOCIN) 500 mg in sodium chloride 0.9 % 100 mL IVPB  Status:  Discontinued     500 mg 100 mL/hr over 60 Minutes Intravenous Every 12 hours 01/17/17 0934 01/20/17 0925   01/17/17 0700  vancomycin (VANCOCIN) IVPB 1000 mg/200 mL premix     1,000 mg 200 mL/hr over 60 Minutes Intravenous  Once 01/17/17 0650 01/17/17 0800   01/16/17 0000  cefpodoxime (VANTIN) 200 MG tablet     200 mg Oral 2 times daily 01/16/17 0841 01/23/17 2359   01/15/17 0600  ceFEPIme (MAXIPIME) 1 g in dextrose  5 % 50 mL IVPB  Status:  Discontinued     1 g 100 mL/hr over 30 Minutes Intravenous Every 24 hours 01/23/2017 0819 01/22/17 1249   01/08/2017 0530  cefTRIAXone (ROCEPHIN) 1 g in dextrose 5 % 50 mL IVPB  Status:  Discontinued     1 g 100 mL/hr over 30 Minutes Intravenous  Once 01/21/2017 0518 01/27/2017 0526   01/30/2017 0530  ceFEPIme (MAXIPIME) 2 g in dextrose 5 % 50 mL IVPB     2 g 100 mL/hr over 30 Minutes Intravenous   Once 01/27/2017 0526 01/03/2017 0633         Objective: Vitals:   01/25/17 0820 01/25/17 0821 01/25/17 0851 01/25/17 0916  BP:  121/73    Pulse: 75 73 82 74  Resp: (!) 25 (!) 30 20 (!) 29  Temp:      TempSrc:      SpO2: 90% 90% (!) 76% 100%  Weight:      Height:        Intake/Output Summary (Last 24 hours) at 01/25/17 1004 Last data filed at 01/25/17 0819  Gross per 24 hour  Intake               10 ml  Output             2100 ml  Net            -2090 ml   Filed Weights   01/23/17 1427 01/24/17 2329 01/25/17 0611  Weight: 60.1 kg (132 lb 7.9 oz) 62.4 kg (137 lb 9.1 oz) 61.9 kg (136 lb 7.4 oz)    Examination:  General exam: NAD, frail HEENT: OP clear  Respiratory system: Tachyphemic, Air intake diminish b/l. No wheezing or rales, coarse sounds Cardiovascular system: S1 & S2 heard, tachy @ 105. No JVD, murmurs, rubs or gallops Gastrointestinal system: Abdomen is nondistended, soft and nontender. No organomegaly or masses felt.  Central nervous system: Alert and oriented. No focal neurological deficits. Extremities: No pedal edema.  Skin: No rashes, lesions or ulcers Psychiatry: Judgement and insight appear normal. Mood & affect appropriate.    Data Reviewed: I have personally reviewed following labs and imaging studies  CBC:  Recent Labs Lab 01/19/17 0234 01/20/17 0649 01/23/17 0612 01/25/17 0442  WBC 12.3* 11.8* 12.9* 14.7*  HGB 8.0* 9.2* 8.7* 8.7*  HCT 24.3* 28.2* 26.2* 27.5*  MCV 89.3 90.4 89.4 89.0  PLT 510* 498* 419* 824*   Basic Metabolic Panel:  Recent Labs Lab 01/20/17 0649 01/21/17 0502 01/23/17 0612 01/24/17 0746 01/25/17 0442  NA 138 135 135 136 133*  K 5.5* 5.0 4.5 4.6 4.4  CL 102 101 98* 100* 95*  CO2 28 28 27 31 28   GLUCOSE 134* 136* 140* 135* 172*  BUN 23* 22* 22* 25* 27*  CREATININE 0.80 0.81 0.77 0.78 0.86  CALCIUM 9.3 9.1 8.9 8.9 8.8*  MG  --   --   --   --  2.3  PHOS  --   --   --   --  3.4   GFR: Estimated Creatinine  Clearance: 45.1 mL/min (by C-G formula based on SCr of 0.86 mg/dL). Liver Function Tests: No results for input(s): AST, ALT, ALKPHOS, BILITOT, PROT, ALBUMIN in the last 168 hours. No results for input(s): LIPASE, AMYLASE in the last 168 hours. No results for input(s): AMMONIA in the last 168 hours. Coagulation Profile:  Recent Labs Lab 01/21/17 0502 01/22/17 2353 01/23/17 0612 01/24/17 0746 01/25/17 0442  INR  2.81 3.11 3.29 2.83 2.74   Cardiac Enzymes:  Recent Labs Lab 01/18/17 1151 01/18/17 1946 01/19/17 0234  TROPONINI <0.03 <0.03 <0.03   BNP (last 3 results) No results for input(s): PROBNP in the last 8760 hours. HbA1C: No results for input(s): HGBA1C in the last 72 hours. CBG:  Recent Labs Lab 01/18/17 1143  GLUCAP 123*   Lipid Profile: No results for input(s): CHOL, HDL, LDLCALC, TRIG, CHOLHDL, LDLDIRECT in the last 72 hours. Thyroid Function Tests: No results for input(s): TSH, T4TOTAL, FREET4, T3FREE, THYROIDAB in the last 72 hours. Anemia Panel: No results for input(s): VITAMINB12, FOLATE, FERRITIN, TIBC, IRON, RETICCTPCT in the last 72 hours. Sepsis Labs:  Recent Labs Lab 01/19/17 0234 01/21/17 0502  PROCALCITON 0.26 0.12    Recent Results (from the past 240 hour(s))  MRSA PCR Screening     Status: None   Collection Time: 01/23/17  2:24 PM  Result Value Ref Range Status   MRSA by PCR NEGATIVE NEGATIVE Final    Comment:        The GeneXpert MRSA Assay (FDA approved for NASAL specimens only), is one component of a comprehensive MRSA colonization surveillance program. It is not intended to diagnose MRSA infection nor to guide or monitor treatment for MRSA infections.       Radiology Studies: Dg Chest Port 1 View  Result Date: 01/25/2017 CLINICAL DATA:  Shortness of breath. History of right lung cancer. History of hypertension and atrial fibrillation. EXAM: PORTABLE CHEST 1 VIEW COMPARISON:  01/23/2017. FINDINGS: PowerPort catheter with  lead tip over the superior vena cava in stable position. Stable cardiomegaly. Stable right lung mass. Persistent but improved bilateral pulmonary interstitial infiltrates and or edema. A component changes may be related radiation treatment. No pleural effusion or pneumothorax. IMPRESSION: 1. PowerPort catheter in stable position. 2.  Stable right lung mass. 3. Stable cardiomegaly. Persistent but improved bilateral pulmonary interstitial infiltrates and or edema . A component these changes may be related radiation treatment. Electronically Signed   By: Marcello Moores  Register   On: 01/25/2017 07:40   Dg Chest Port 1 View  Result Date: 01/23/2017 CLINICAL DATA:  Hypoxia EXAM: PORTABLE CHEST 1 VIEW COMPARISON:  01/16/2017 FINDINGS: Cardiac shadow is stable. Right chest wall port is again seen. Diffuse interstitial changes are noted throughout both lungs new from the prior exam likely representing a component of pulmonary edema. No sizable effusion or infiltrate is seen. IMPRESSION: Increasing interstitial changes likely related to pulmonary edema. Electronically Signed   By: Inez Catalina M.D.   On: 01/23/2017 11:53      Scheduled Meds: . aspirin EC  81 mg Oral Daily  . atorvastatin  40 mg Oral q1800  . busPIRone  15 mg Oral BID  . DULoxetine  60 mg Oral Daily  . furosemide  20 mg Intravenous Once  . furosemide  60 mg Intravenous Q12H  . gabapentin  100 mg Oral QHS  . guaiFENesin  600 mg Oral BID  . ipratropium  0.5 mg Nebulization TID  . levalbuterol  1.25 mg Nebulization TID  . magic mouthwash  5 mL Oral TID PC & HS  . methylPREDNISolone (SOLU-MEDROL) injection  60 mg Intravenous Q12H  . pantoprazole  40 mg Oral BID AC  . potassium chloride  20 mEq Oral Q12H  . sodium chloride flush  10-40 mL Intracatheter Q12H  . Warfarin - Pharmacist Dosing Inpatient   Does not apply q1800   Continuous Infusions:   LOS: 9 days  Time spent: Total of 35 minutes spent with pt, greater than 50% of which was  spent in discussion of  treatment, counseling and coordination of care    Chipper Oman, MD Pager: Text Page via [email or web address]   If 7PM-7AM, please contact night-coverage [email or web address] 01/25/2017, 10:04 AM

## 2017-01-25 NOTE — Progress Notes (Signed)
   01/25/17 0851  Vitals  Pulse Rate 82  ECG Heart Rate 85  Resp 20  Oxygen Therapy  SpO2 (!) 76 %  when stood up on side of bed sats dropped quickly to 76% on 12l/min HFNC

## 2017-01-25 NOTE — Progress Notes (Signed)
Got pt up to [facility], bp 116 88, lung sounds diminished. Pt felt fine. Assist x2 to [facility], sat there a few minutes then became pale, diaphorectic unresponsive to family or staff. Returned to bed, called rapid response and MD. Vital signs taken oxygen increased to 12l/min as sats 76%. Pupils sluggish bilaterally.

## 2017-01-25 NOTE — Care Management Note (Signed)
Case Management Note Previous Note Created by Jolene Provost while pt was at [facility]  Patient Details  Name: Alexandria Chen MRN: [id number] Date of Birth: 02-18-1935  Expected Discharge Date:     01/22/2017             Expected Discharge Plan:  [place]  In-House Referral:  NA  Discharge planning Services  CM Consult  Post Acute Care Choice:  Home Health, Resumption of Svcs/PTA Provider, Durable Medical Equipment Choice offered to:  Patient  DME Arranged:  Chiropodist, Wheelchair manual, Oxygen DME Agency:  [place] Arranged:  RN, PT, OT, Nurse's Aide, Respirator Therapy HH Agency:  [facility]  Status of Service:  Completed, signed off  Additional Comments: 01/25/2017 Pt was transferred to [facility] from [facility] - now on SD.  Pt continues to require HF Vass (am episode of unresponsiveness required rapid response), pt also on  IV steriods and IV lasix.  CM will continue to follow for discharge needs  01/21/17 Anticipate DC over weekend. Pt will have 24/7 supervision. Pt will resume [place] services with [facility], Santiago Glad, [facility] rep, aware of DC planned for weekend, will pull resumption order from Epic. CM requested they make resumption visit as soon as possible as family is very anxious about pt going home on HFNC. RN will verify DC with [facility] rep over weekend. Pt will need WC, neb machine and home oxygen at 10L. Pt/family has chosen [facility] from list of DME providers. Vaughan Basta, [facility] rep, aware of referral and will obtain pt info from chart. DME will be delivered to pt room prior to DC. CM had long discussion with dtr kim about pt's ability to be cared for at home. Pt has ambulated with PT. Family concerned about their inability to monitor vital signs at home, CM explained that pulse ox and BP machine available OTC. They do not have to monitor continuously, check pt's saturations if pt feels like something has changes, feels SOB or begin to be  confused of have AMS. Family told that [facility] providers will be a Microbiologist and will provide pt/family with education and support as needed.   Maryclare Labrador, RN 01/25/2017, 10:05 AM

## 2017-01-25 NOTE — Progress Notes (Signed)
PT Cancellation Note  Patient Details Name: Alexandria Chen MRN: [id number] DOB: May 01, 1935   Cancelled Treatment:    Reason Eval/Treat Not Completed: Medical issues which prohibited therapy Pt became unresponsive transferring to [facility], SaO2 dropped to 76%O2 on 12 HFNC. PT will continue to follow as able today.  Tynisha Ogan B. Migdalia Dk PT, DPT Acute Rehabilitation  [phone] Pager [phone]  [place] 01/25/2017, 9:28 AM

## 2017-01-25 NOTE — Significant Event (Signed)
Rapid Response Event Note  Overview: Time Called: 0814 Arrival Time: 0815 Event Type: Respiratory  Initial Focused Assessment: Per RN she had assisted patient from the bed to the [facility] without any problems.  While the patient was sitting on the [facility] she became cold and clammy and did not respond to staff.  BP 123/99, unable to obtain O2 sats at the time.  Assisted back to bed.  Lying in bed,  Patient alert and easily responsive. O2 sats 76% on 6l [place].    Interventions: Increased O2 to 12L HFNC O2 sats 90-92%. Dr Elon Jester at bedside to assess patient.   He assisted her to stand next to the bed and her O2 sats decreased to 69%.  Assisted back to lying in bed and increased O2 to 15L for a few minutes. Able to wean O2 to 10L HFNC with sats 100%  Daughter at bedside during the event.  Plan of Care (if not transferred):  Event Summary: Name of Physician Notified: Dr Elon Jester at [phone]    at    Outcome: Stayed in room and stabalized     Raliegh Ip

## 2017-01-25 NOTE — Progress Notes (Signed)
PULMONARY / CRITICAL CARE MEDICINE   Name: Alexandria Chen MRN: [id number] DOB: [birth year]    ADMISSION DATE:  01/13/2017 CONSULTATION DATE:  01/23/17  REFERRING MD:  Dr. 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 She is completed 8 days of vancomycin and cefepime. She reports that she feels better. She has shortness of breath and dyspnea on exertion and dizziness when she stands up.  SUBJECTIVE:  Had desaturations while getting up to use bedside commode, needed to be placed on 12L high flow. While resting, SpO2 is 98 - 100%.  VITAL SIGNS: BP 121/73   Pulse 74   Temp 97.9 F (36.6 C) (Oral)   Resp (!) 29   Ht 5\' 2"  (1.575 m)   Wt 61.9 kg (136 lb 7.4 oz)   SpO2 100%   BMI 24.96 kg/m   HEMODYNAMICS:    VENTILATOR SETTINGS:    INTAKE / OUTPUT: I/O last 3 completed shifts: In: 250 [P.O.:240; I.V.:10] Out: 1750 [Urine:1750]  PHYSICAL EXAMINATION: Gen:      Adult female, No acute distress HEENT:  EOMI, sclera anicteric Neck:     No masses; no thyromegaly Lungs:    Clear to auscultation bilaterally; normal respiratory effort CV:         Regular rate and rhythm; no murmurs Abd:      + bowel sounds; soft, non-tender; no palpable masses, no distension Ext:    No edema; adequate peripheral perfusion Skin:      Warm and dry; no rash Neuro:   alert and oriented x 3 Psych:   normal mood and affect  LABS:  BMET  Recent Labs Lab 01/23/17 0612 01/24/17 0746 01/25/17 0442  NA 135 136 133*  K 4.5 4.6 4.4  CL 98* 100* 95*  CO2 27 31 28   BUN 22* 25* 27*  CREATININE 0.77 0.78 0.86  GLUCOSE 140* 135* 172*    Electrolytes  Recent Labs Lab  01/23/17 0612 01/24/17 0746 01/25/17 0442  CALCIUM 8.9 8.9 8.8*  MG  --   --  2.3  PHOS  --   --  3.4    CBC  Recent Labs Lab 01/20/17 0649 01/23/17 0612 01/25/17 0442  WBC 11.8* 12.9* 14.7*  HGB 9.2* 8.7* 8.7*  HCT 28.2* 26.2* 27.5*  PLT 498* 419* 401*    Coag's  Recent Labs Lab 01/23/17 0612 01/24/17 0746 01/25/17 0442  INR 3.29 2.83 2.74    Sepsis Markers  Recent Labs Lab 01/19/17 0234 01/21/17 0502  PROCALCITON 0.26 0.12    ABG  Recent Labs Lab 01/21/17 1417  PHART 7.418  PCO2ART 39.7  PO2ART 57.3*    Liver Enzymes No results for input(s): AST, ALT, ALKPHOS, BILITOT, ALBUMIN in the last 168 hours.  Cardiac Enzymes  Recent Labs Lab 01/18/17 1151 01/18/17 1946 01/19/17 0234  TROPONINI <0.03 <0.03 <0.03    Glucose  Recent Labs Lab 01/18/17 1143  GLUCAP 123*    Imaging Dg Chest Port 1 View  Result Date: 01/25/2017 CLINICAL DATA:  Shortness of breath. History of right lung cancer. History of hypertension and atrial fibrillation. EXAM: PORTABLE CHEST 1 VIEW COMPARISON:  01/23/2017. FINDINGS: PowerPort catheter with lead tip over the superior vena  cava in stable position. Stable cardiomegaly. Stable right lung mass. Persistent but improved bilateral pulmonary interstitial infiltrates and or edema. A component changes may be related radiation treatment. No pleural effusion or pneumothorax. IMPRESSION: 1. PowerPort catheter in stable position. 2.  Stable right lung mass. 3. Stable cardiomegaly. Persistent but improved bilateral pulmonary interstitial infiltrates and or edema . A component these changes may be related radiation treatment. Electronically Signed   By: Marcello Moores  Register   On: 01/25/2017 07:40     STUDIES:  Outpatient CT chest 9/12 shows diffuse new groundglass opacities throughout right upper lobe, new since 1 month prior Chest x-ray 9/23 shows worsening and now bilateral diffuse interstitial infiltrates, radiologist read is  likely related to pulmonary edema.  CULTURES: Blood culture is negative  ANTIBIOTICS: vancomycin and cefepime 8 days 9/15 > 9/22  SIGNIFICANT EVENTS: 9/14 admission to hospital for shortness of breath 9/23 Transferred to [facility]  LINES/TUBES: Right port  DISCUSSION: 82 year old with lung cancer status post chemoradiation admitted with hypoxic respiratory failure, with lung infiltrates. She has received adequate treatment for HCAP. Her progression may be related to radiation pneumonitis or amiodarone toxicity. She likely has a component of volume overload.  ASSESSMENT / PLAN:  Acute hypoxemic respiratory failure - having desaturations with any activity Concern for radiation induced lung injury Possible HCAP - s/p 8 days vanc / cefepime P:   Continue supplemental oxygen. Wean down as tolerated Continue steroids, drop from 80mg  q12 to 60 Increase diuresis as she is 10lbs overweight since admit 9/14 (see below) Continue BD's Continue to monitor off abx Will ask PT to see and assist with mobilization  Atrial fibrillation (was on amiodarone which was stopped 9/23).  Currently in NSR. P:  Continue Atorvastatin, aspirin, nitroglycerin Additional 20mg  lasix now then inrease from 40mg  q12hrs to 60mg  for now, may need TID dosing if responds well Telemetry monitoring Continue warfarin anticoagulation   Rest per primary team   FAMILY  - Updates: Patient and daughter updated at bedside 9/24.  Patient and 2 daughters updated again 9/25.  - Inter-disciplinary family meet or Palliative Care meeting due by:  01/30/17     Montey Hora, [facility] Pager: [phone]  or [phone] 01/25/2017, 9:46 AM

## 2017-01-25 NOTE — Consult Note (Addendum)
CARDIOLOGY CONSULT NOTE  Patient ID: Alexandria Chen MRN: [id number] DOB/AGE: May 22, 1934 82 y.o.  Admit date: 01/13/2017 Referring Physician  Chipper Oman, MD Primary Physician:  [name], FNP Reason for Consultation  A. Fib with RVR  HPI: Alexandria Chen  is a 82 y.o. female  With history of lung cancer involving the right upper lobe, squamous cell carcinoma, history of radiation therapy and chemotherapy, paroxysmal atrial fibrillation being followed at [facility], on chronic amiodarone therapy, admitted on 01/26/2017 for hypoxemia and respiratory distress. She has history of hypertension, hyperlipidemia, depression and anxiety.  I been consulted to manage her atrial fibrillation. Amiodarone was discontinued on 01/23/2017, there is suspicion for radiation-induced lung injury.  On questioning, patient denies any symptoms associated with A. fib with RVR. This afternoon she had an episode of A. fib with RVR. Her main issue has been marked dyspnea even doing minimal activities, sitting up in chair dropped her saturations down to 76%. Her family is present at the bedside.  Past Medical History:  Diagnosis Date  . A-fib ([place])   . Alcohol abuse, in remission   . Anxiety   . Atrial fibrillation ([place])   . Depression   . Dyspnea    walking distances   . Dysrhythmia   . History of bronchitis   . HTN (hypertension)   . Mass of right lung 07/13/2016  . Osteopenia   . Squamous cell carcinoma of right lung ([place]) 07/13/2016     Past Surgical History:  Procedure Laterality Date  . IR FLUORO GUIDE PORT INSERTION RIGHT  09/17/2016  . IR US GUIDE VASC ACCESS RIGHT  09/17/2016     Family History  Problem Relation Age of Onset  . Anxiety disorder Mother   . Dementia Mother   . Hypertension Mother   . Alcohol abuse Father   . Arthritis Father   . Alcohol abuse Brother   . ADD / ADHD Neg Hx   . Drug abuse Neg Hx   . Bipolar disorder Neg Hx   . Depression Neg Hx   . OCD Neg Hx   . Paranoid  behavior Neg Hx   . Schizophrenia Neg Hx   . Seizures Neg Hx   . Sexual abuse Neg Hx   . Physical abuse Neg Hx      Social History: Social History   Social History  . Marital status: Widowed    Spouse name: N/A  . Number of children: 5  . Years of education: N/A   Occupational History  . Retired    Social History Main Topics  . Smoking status: Former Smoker    Packs/day: 2.00    Years: 60.00    Types: Cigarettes    Start date: 05/03/1956    Quit date: 07/15/2016  . Smokeless tobacco: Never Used  . Alcohol use No     Comment: Patient reports no alcohol use since 05/24/2016.  . Drug use: No  . Sexual activity: No   Other Topics Concern  . Not on file   Social History Narrative  . No narrative on file     Prescriptions Prior to Admission  Medication Sig Dispense Refill Last Dose  . acetaminophen (TYLENOL) 325 MG tablet Take 2 tablets (650 mg total) by mouth every 4 (four) hours as needed for mild pain, moderate pain, fever or headache. 100 tablet 1 Past Week at Unknown time  . amiodarone (PACERONE) 200 MG tablet Take 200 mg by mouth daily.    01/13/2017  at Unknown time  . aspirin EC 81 MG tablet Take 81 mg by mouth daily.   01/13/2017 at Unknown time  . atorvastatin (LIPITOR) 40 MG tablet TAKE 1 TABLET (40 MG TOTAL) BY MOUTH DAILY. FOR HYPERLIPIDEMIA 90 tablet 1 01/13/2017 at Unknown time  . busPIRone (BUSPAR) 15 MG tablet Take 1 tablet (15 mg total) by mouth 2 (two) times daily. 180 tablet 2 01/13/2017 at Unknown time  . DULoxetine (CYMBALTA) 60 MG capsule Take 1 capsule (60 mg total) by mouth daily. 30 capsule 2 01/13/2017 at Unknown time  . fish oil-omega-3 fatty acids 1000 MG capsule Take 3 capsules (3 g total) by mouth daily. For hyperlipidemia. (Patient taking differently: Take 1 g by mouth 3 (three) times daily. For hyperlipidemia.) 30 capsule  01/13/2017 at Unknown time  . fluticasone (FLONASE) 50 MCG/ACT nasal spray Place 2 sprays into both nostrils 2 (two) times daily.  Use as needed (Patient taking differently: Place 2 sprays into both nostrils 2 (two) times daily as needed for allergies. Use as needed) 16 g 6 unknown  . gabapentin (NEURONTIN) 100 MG capsule Take 1 capsule (100 mg total) by mouth at bedtime. 90 capsule 2 01/13/2017 at Unknown time  . nitroGLYCERIN (NITROSTAT) 0.4 MG SL tablet Place 0.4 mg under the tongue.   unknown  . omeprazole (PRILOSEC) 40 MG capsule Take 1 capsule (40 mg total) by mouth daily. 30 capsule 2 01/13/2017 at Unknown time  . warfarin (COUMADIN) 2.5 MG tablet Take 3 mg by mouth daily.    01/13/2017 at 0730  . [DISCONTINUED] HYDROcodone-homatropine (HYCODAN) 5-1.5 MG/5ML syrup Take 5 mLs by mouth every 6 (six) hours as needed for cough.   01/13/2017 at Unknown time   Review of Systems  Constitutional: Negative for chills and fever.  HENT: Negative for hearing loss.   Eyes: Negative for blurred vision.  Respiratory: Positive for cough and shortness of breath. Negative for hemoptysis, sputum production and wheezing.   Cardiovascular: Negative for chest pain, palpitations, orthopnea, leg swelling and PND.  Gastrointestinal: Negative for abdominal pain, heartburn and nausea.  Genitourinary: Negative for dysuria and urgency.  Musculoskeletal: Negative.   Skin: Negative for rash.  Neurological: Positive for dizziness. Negative for tingling, tremors and focal weakness.  Endo/Heme/Allergies: Negative for polydipsia. Does not bruise/bleed easily.  All other systems reviewed and are negative.   Physical Exam: Blood pressure 122/71, pulse 96, temperature (!) 97.4 F (36.3 C), temperature source Oral, resp. rate (!) 24, height 5\' 2"  (1.575 m), weight 61.9 kg (136 lb 7.4 oz), SpO2 94 %.   Physical Exam  Constitutional: She is oriented to person, place, and time.  Moderately built and frail, in no acute distress.   HENT:  Head: Atraumatic.  Eyes: Conjunctivae and EOM are normal.  Neck: Neck supple. No JVD present. No tracheal deviation  present. No thyromegaly present.  Cardiovascular: Normal rate and normal heart sounds.   Faint bilateral carotid bruit right more prominent than the left. Femoral pulse normal. Right popliteal pulse 1-2+ compared to left which is 2+. Pedal pulses are faint.  Pulmonary/Chest: No stridor. No respiratory distress. She has wheezes. She has rales. She exhibits no tenderness.  Abdominal: Soft. Bowel sounds are normal.  Musculoskeletal: Normal range of motion.  Neurological: She is alert and oriented to person, place, and time. She has normal reflexes.  Skin: Skin is warm and dry.  Psychiatric: She has a normal mood and affect.   Labs:  Lab Results  Component Value Date  WBC 14.7 (H) 01/25/2017   HGB 8.7 (L) 01/25/2017   HCT 27.5 (L) 01/25/2017   MCV 89.0 01/25/2017   PLT 401 (H) 01/25/2017    Recent Labs Lab 01/25/17 0442  NA 133*  K 4.4  CL 95*  CO2 28  BUN 27*  CREATININE 0.86  CALCIUM 8.8*  GLUCOSE 172*    Lipid Panel     Component Value Date/Time   CHOL 161 05/21/2016 1449   TRIG 63 05/21/2016 1449   TRIG 75 07/25/2013 1019   HDL 48 05/21/2016 1449   HDL 44 07/25/2013 1019   CHOLHDL 3.4 05/21/2016 1449   LDLCALC 100 (H) 05/21/2016 1449   LDLCALC 100 (H) 07/25/2013 1019    BNP (last 3 results)  Recent Labs  01/17/17 0913  BNP 84.0   Cardiac Panel (last 3 results)  Recent Labs  01/18/17 1151 01/18/17 1946 01/19/17 0234  TROPONINI <0.03 <0.03 <0.03    Lab Results  Component Value Date   TROPONINI <0.03 01/19/2017     TSH  Recent Labs  02/05/16 1441 11/27/16 2008  TSH 1.010 0.327*   Radiology: Dg Chest Port 1 View  Result Date: 01/25/2017 CLINICAL DATA:  Shortness of breath. History of right lung cancer. History of hypertension and atrial fibrillation. EXAM: PORTABLE CHEST 1 VIEW COMPARISON:  01/23/2017. FINDINGS: PowerPort catheter with lead tip over the superior vena cava in stable position. Stable cardiomegaly. Stable right lung mass.  Persistent but improved bilateral pulmonary interstitial infiltrates and or edema. A component changes may be related radiation treatment. No pleural effusion or pneumothorax. IMPRESSION: 1. PowerPort catheter in stable position. 2.  Stable right lung mass. 3. Stable cardiomegaly. Persistent but improved bilateral pulmonary interstitial infiltrates and or edema . A component these changes may be related radiation treatment. Electronically Signed   By: Marcello Moores  Register   On: 01/25/2017 07:40    Scheduled Meds: . aspirin EC  81 mg Oral Daily  . atorvastatin  40 mg Oral q1800  . busPIRone  15 mg Oral BID  . diltiazem  90 mg Oral Q12H  . DULoxetine  60 mg Oral Daily  . furosemide  60 mg Intravenous Q12H  . gabapentin  100 mg Oral QHS  . guaiFENesin  600 mg Oral BID  . ipratropium  0.5 mg Nebulization TID  . levalbuterol  1.25 mg Nebulization TID  . magic mouthwash  5 mL Oral TID PC & HS  . methylPREDNISolone (SOLU-MEDROL) injection  60 mg Intravenous Q12H  . pantoprazole  40 mg Oral BID AC  . potassium chloride  20 mEq Oral Q12H  . sodium chloride flush  10-40 mL Intracatheter Q12H  . warfarin  1.5 mg Oral ONCE-1800  . Warfarin - Pharmacist Dosing Inpatient   Does not apply q1800   Continuous Infusions: PRN Meds:.albuterol, alum & mag hydroxide-simeth, hydrALAZINE, nitroGLYCERIN, sodium chloride, sodium chloride flush  CARDIAC STUDIES:  EKG 01/25/2017: A. fib with RVR at a rate of 125 beats a minute, LVH. Normal QT interval. No evidence of ischemia. Compared to morning EKG, normal sinus rhythm, LVH.  Echocardiogram 01/17/2017: Mild LVH with LVEF 60-65% and grade 1 diastolic dysfunction. Mild   left atrial enlargement. Trivial mitral regurgitation. Mildly   calcified aortic annulus. Mild tricuspid regurgitation with PASP   estimated 42 mmHg.  Carotid artery duplex 11/29/2016: There is less than 50% stenosis in the right and left internal carotid arteries. There is plaque in both bulbs  right greater than left.  ASSESSMENT AND PLAN:  1. Paroxysmal atrial fibrillation with rapid ventricular response CHA2DS2-VASCScore: Risk Score  4,  Yearly risk of stroke  4. Recommendation: ASA No/Anticoagulation Yes  2. Hypertension 3. Shortness of breath 4. Hypoxemia 5.  Interstitial lung disease, suspect radiation injury along with amiodarone toxicity possible. 6. Hyperglycemia 7. Abnormal TSH, probably sick euthyroid syndrome 8. Anemia of chronic disease.  Recommendation: Patient essentially asymptomatic with A. fib with RVR and only on questioning stated felt some chest discomfort. I'll add metoprolol tartrate 25 mg three times a day, continue diltiazem.  Discontinue furosemide as there is no suspicion for congestive heart failure from my standpoint. Her symptoms are more consistent with amiodarone toxicity and radiation-induced lung injury/ILD. Continue anticoagulation with warfarin due to high risk of cardioembolic phenomena. Discontinue aspirin to decrease the risk of bleeding. I'll continue to follow along sidelines. As long as she remains asymptomatic with A. fib with RVR without symptoms of angina or congestive heart failure, would not be very aggressive with heart rate control.   I would discontinue atorvastatin in view of multiple medical comorbidities and would not make a difference in the long run. Would Check A1c.   Adrian Prows, MD 01/25/2017, 6:02 PM [facility] [place] Pager: [phone] Office: [phone] If no answer Cell [phone]

## 2017-01-25 NOTE — Progress Notes (Signed)
ANTICOAGULATION CONSULT NOTE - Follow Up Consult  Pharmacy Consult for Coumadin Indication: atrial fibrillation  No Known Allergies  Patient Measurements: Height: 5\' 2"  (157.5 cm) Weight: 136 lb 7.4 oz (61.9 kg) IBW/kg (Calculated) : 50.1  Vital Signs: Temp: 97.4 F (36.3 C) (09/25 1548) Temp Source: Oral (09/25 1548) BP: 122/71 (09/25 1548) Pulse Rate: 96 (09/25 1548)  Labs:  Recent Labs  01/23/17 0612 01/24/17 0746 01/25/17 0442  HGB 8.7*  --  8.7*  HCT 26.2*  --  27.5*  PLT 419*  --  401*  LABPROT 33.2* 29.5* 28.8*  INR 3.29 2.83 2.74  CREATININE 0.77 0.78 0.86    Estimated Creatinine Clearance: 45.1 mL/min (by C-G formula based on SCr of 0.86 mg/dL).  Assessment:   82 yr old female continues on Coumadin for atrial fibrillation.    INR is therapeutic at 2.74.  Dose held on 9/23 when INR 3.29, resumed on 9/24 with 1.5 mg x 1. Amiodarone stopped on 9/23 with concern for possible pulmonary toxicity.      Home Coumadin regimen: 1.5 mg daily except 3 mg on Sunday and Thursday.        INR on [facility] to [facility] on 9/14 = 2.76  Goal of Therapy:  INR 2-3 Monitor platelets by anticoagulation protocol: Yes   Plan:   Coumadin 1.5 mg again today; usual Tuesday dose.  Daily PT/INR.  Watch for change in Coumadin needs with amiodarone dc'd 01/23/17.  Arty Baumgartner, [place] Pager: [phone] 01/25/2017,4:09 PM

## 2017-01-26 ENCOUNTER — Ambulatory Visit (HOSPITAL_COMMUNITY): Payer: Self-pay | Admitting: Adult Health

## 2017-01-26 DIAGNOSIS — T462X5A Adverse effect of other antidysrhythmic drugs, initial encounter: Secondary | ICD-10-CM

## 2017-01-26 DIAGNOSIS — R0602 Shortness of breath: Secondary | ICD-10-CM

## 2017-01-26 DIAGNOSIS — J984 Other disorders of lung: Secondary | ICD-10-CM

## 2017-01-26 DIAGNOSIS — I480 Paroxysmal atrial fibrillation: Secondary | ICD-10-CM

## 2017-01-26 DIAGNOSIS — E785 Hyperlipidemia, unspecified: Secondary | ICD-10-CM

## 2017-01-26 DIAGNOSIS — D649 Anemia, unspecified: Secondary | ICD-10-CM

## 2017-01-26 LAB — HEMOGLOBIN A1C
HEMOGLOBIN A1C: 5.8 % — AB (ref 4.8–5.6)
Mean Plasma Glucose: 119.76 mg/dL

## 2017-01-26 LAB — CBC
HCT: 27.9 % — ABNORMAL LOW (ref 36.0–46.0)
Hemoglobin: 9 g/dL — ABNORMAL LOW (ref 12.0–15.0)
MCH: 28.5 pg (ref 26.0–34.0)
MCHC: 32.3 g/dL (ref 30.0–36.0)
MCV: 88.3 fL (ref 78.0–100.0)
Platelets: 374 10*3/uL (ref 150–400)
RBC: 3.16 MIL/uL — ABNORMAL LOW (ref 3.87–5.11)
RDW: 16.5 % — AB (ref 11.5–15.5)
WBC: 15.2 10*3/uL — AB (ref 4.0–10.5)

## 2017-01-26 LAB — BASIC METABOLIC PANEL
Anion gap: 9 (ref 5–15)
BUN: 26 mg/dL — AB (ref 6–20)
CALCIUM: 8.8 mg/dL — AB (ref 8.9–10.3)
CHLORIDE: 97 mmol/L — AB (ref 101–111)
CO2: 27 mmol/L (ref 22–32)
Creatinine, Ser: 0.82 mg/dL (ref 0.44–1.00)
GFR calc Af Amer: >60 mL/min (ref >60)
Glucose, Bld: 142 mg/dL — ABNORMAL HIGH (ref 65–99)
Potassium: 4.5 mmol/L (ref 3.5–5.1)
SODIUM: 133 mmol/L — AB (ref 135–145)

## 2017-01-26 LAB — TROPONIN I

## 2017-01-26 LAB — PROTIME-INR
INR: 2.7
PROTHROMBIN TIME: 28.4 s — AB (ref 11.4–15.2)

## 2017-01-26 MED ORDER — METOPROLOL TARTRATE 25 MG PO TABS
25.0000 mg | ORAL_TABLET | Freq: Two times a day (BID) | ORAL | Status: DC
  Administered 2017-01-27 – 2017-01-29 (×4): 25 mg via ORAL
  Filled 2017-01-26 (×5): qty 1

## 2017-01-26 MED ORDER — WARFARIN SODIUM 3 MG PO TABS
1.5000 mg | ORAL_TABLET | Freq: Once | ORAL | Status: AC
  Administered 2017-01-26: 1.5 mg via ORAL
  Filled 2017-01-26: qty 0.5

## 2017-01-26 NOTE — Progress Notes (Signed)
PROGRESS NOTE    Alexandria Chen  CZY:[id number] DOB: 1934/10/08 DOA: 01/23/2017 PCP: Alexandria Balloon, FNP  Brief Narrative: Alexandria Chen  Is a 83 year old woman admitted from home on 9/14 due being short of breath. She has history of atrial fibrillation, anxiety, depression hypertension and lung cancer recent finishing chemotherapy and radiation. She was recently treated for pneumonia back in August to [facility] on 9/14 with difficulty breathing and weakness. Patient was found to be hypoxic and was brought into the hospital for evaluation.She was started on antibiotics as there was concern of healthcare associated pneumonia, also was started on Lasix as there also was concern of acute diastolic dysfunction. Patient did not show signs of improvement and had increasing oxygen requirement up to 100% non-rebreather mask. Patient subsequently was transferred to [facility] given the lack of pulmonary service at [facility] and she was admitted to the SDU. Patient was continued on oxygen supplementation and was started on steroids subsequently transferred to [facility] for further management. She remains dyspenic and desaturates on minimal exertion and it is felt that it is related to Amiodarone Toxicity vs. Radiation Pneumonitis and that it may take several weeks for her to clinically improve.   Assessment & Plan:   Principal Problem:   Hypoxia Active Problems:   Depression   Essential hypertension, benign   Hyperlipidemia   Atrial fibrillation (HCC) [I48.91]   HCAP (healthcare-associated pneumonia)   Hypokalemia   Bronchospasm   Acute respiratory failure (HCC)  Acute respiratory failure with hypoxia due in the setting of ILD and Squamous Cell Lung Cancer with component of radiation pneumonitis versus amiodarone toxicity -Failure to improve, worsening over a week course -Having episodes of syncope due to hypoxia  -Continue Steroids at 60 mg q12h and wean to 40 mg IV q12h -Lasix D/C'd  by Cardiology Dr. Einar Gip -Completed course of antibiotic due to concerns of pneumonia with Vancomycin and Cefepime  -C/w Levalbuterol 1.25 mg / Ipratropium 0.5 mg Neb TID -PCCM recommendations appreciated; Recommend to continue to Hold Amiodarone and Taper Steroids to 40 mg q12h and remain at this dose until clinical improvement -Per PCCM may take a few weeks to improve and recommend continuing Rehabilitation Efforts -C/w Flutter Valve and Incentive Spirometry, Supportive Care with Guaifenesin 600 mg po BID -C/w PT to Assist with Mobilization -Pulmonary repeating CXR on 9/28; CXR 01/25/17 showed PowerPort catheter in stable position. Stable right lung mass. Stable cardiomegaly. Persistent but improved bilateral pulmonary interstitial infiltrates and or edema . A component these changes may be related radiation treatment. -Discussed with Patient's Primary Radiation Oncologist Dr. Quitman Livings and he feels that there is a component of Radiation Pneumonitis and recommended Steroids  -Weaned down to 6 Liters High Flow O2  Syncope due to hypoxia with exertion  -See above  Chronic diastolic HF  -Weight is up - Lasix increased by Pulmonary Team but discontinued by Cardiology -Continue to monitor - check Cr  -Strict I's/O's, Daily Weights; -Patient is -6.0935 Liters and Weight is down 3 lbs since yesterday -Continue to Monitor Volume Status   A. Fib with RVR, improved -Patient was on amiodarone but due to possible toxicity this was discontinued -CHADS2-VASc of 4 -Was given 1 dose of metoprolol 5 mg IV -Cardizem 90 mg twice a day; Cardiology added Metoprolol 25 mg po TID and then changing it 25 mg po BID -A/c with warfarin  -Cardiology consulted - Dr Einar Gip and appreciated Recc's -C/w Metoprolol and Cardizem as above  Hypertension -Stable -  C/w Hydralazine 10 mg IV q8hprn for SBP >180 or DBP >100  Chronic Pain Syndrome -Continue Gabapentin 100 mg po qHS  Depression with Anxiety -No  suicidal or homicidal ideation  -Continue Duloxetine 60 mg po Daily and Buspirone 15 mg po BID   Leukocytosis -Likely Steroid Demargination vs. Reactive from Lung Cancer -Patient is s/p Treatment with Abx -WBC went from 11.8 -> 12.9 -> 14.7 -> 15.2 -Continue to Monitor for S/Sx of Infection -Repeat CBC in AM   Hyponatremia -Mild at 133; Patient's Diuresis stopped yesterday per Cards -Continue to Monitor   DVT prophylaxis: Anticoagulated with Warfarin Code Status: FULL CODE Family Communication: Discussed with Daughters at bedside Disposition Plan: Remain Inpatient at this time but Home Health PT at D/C; Remain in SDU at this time  Consultants:   PCCM  Cardiology   Procedures: None   Antimicrobials:  Anti-infectives    Start     Dose/Rate Route Frequency Ordered Stop   01/20/17 1000  vancomycin (VANCOCIN) IVPB 750 mg/150 ml premix  Status:  Discontinued     750 mg 150 mL/hr over 60 Minutes Intravenous Every 12 hours 01/20/17 0925 01/22/17 1249   01/17/17 1800  vancomycin (VANCOCIN) 500 mg in sodium chloride 0.9 % 100 mL IVPB  Status:  Discontinued     500 mg 100 mL/hr over 60 Minutes Intravenous Every 12 hours 01/17/17 0934 01/20/17 0925   01/17/17 0700  vancomycin (VANCOCIN) IVPB 1000 mg/200 mL premix     1,000 mg 200 mL/hr over 60 Minutes Intravenous  Once 01/17/17 0650 01/17/17 0800   01/16/17 0000  cefpodoxime (VANTIN) 200 MG tablet     200 mg Oral 2 times daily 01/16/17 0841 01/23/17 2359   01/15/17 0600  ceFEPIme (MAXIPIME) 1 g in dextrose 5 % 50 mL IVPB  Status:  Discontinued     1 g 100 mL/hr over 30 Minutes Intravenous Every 24 hours 01/13/2017 0819 01/22/17 1249   01/30/2017 0530  cefTRIAXone (ROCEPHIN) 1 g in dextrose 5 % 50 mL IVPB  Status:  Discontinued     1 g 100 mL/hr over 30 Minutes Intravenous  Once 01/10/2017 0518 01/01/2017 0526   01/17/2017 0530  ceFEPIme (MAXIPIME) 2 g in dextrose 5 % 50 mL IVPB     2 g 100 mL/hr over 30 Minutes Intravenous  Once 01/19/2017  0526 01/25/2017 2409     Subjective: Seen and examined and was laying in bed in NAD but slight movements caused her to Desaturate. No CP appreciated. No abdominal Pain. Denied any other complaints or concerns.   Objective: Vitals:   01/25/17 2137 01/26/17 0000 01/26/17 0100 01/26/17 0300  BP: 117/65 (!) 143/73    Pulse: 72 61    Resp:  16    Temp:  97.6 F (36.4 C) (!) 97.3 F (36.3 C) 97.6 F (36.4 C)  TempSrc:  Oral Oral Oral  SpO2:  100%    Weight:    60.6 kg (133 lb 9.6 oz)  Height:        Intake/Output Summary (Last 24 hours) at 01/26/17 0731 Last data filed at 01/26/17 0311  Gross per 24 hour  Intake              600 ml  Output             2200 ml  Net            -1600 ml   Filed Weights   01/24/17 2329 01/25/17 0611 01/26/17 0300  Weight: 62.4 kg (137 lb 9.1 oz) 61.9 kg (136 lb 7.4 oz) 60.6 kg (133 lb 9.6 oz)   Examination: Physical Exam:  Constitutional: Frail AAF NAD and appears calm and comfortable Eyes: Lids and conjunctivae normal, sclerae anicteric  ENMT: External Ears, Nose appear normal. Grossly normal hearing. Mucous membranes are moist. Neck: Appears normal, supple, no cervical masses, normal ROM, no appreciable thyromegaly, no JVD Respiratory: Diminished to auscultation bilaterally, no wheezing, rales, rhonchi or crackles. Has coarse breath sounds. Normal respiratory effort and patient is not tachypenic. No accessory muscle use. Wearing supplemental O2 via [place] Cardiovascular: Bradycardic Rate, no murmurs / rubs / gallops. S1 and S2 auscultated. No extremity edema.  Abdomen: Soft, non-tender, non-distended. No masses palpated. No appreciable hepatosplenomegaly. Bowel sounds positive x4.  GU: Deferred. Musculoskeletal: No clubbing / cyanosis of digits/nails. No joint deformity upper and lower extremities. No contractures.  Skin: No rashes, lesions, ulcers on a limited skin eval. No induration; Warm and dry.  Neurologic: CN 2-12 grossly intact with no focal  deficits. Strength 5/5 in all 4. Romberg sign cerebellar reflexes not assessed.  Psychiatric: Normal judgment and insight. Alert and oriented x 3. Normal mood and appropriate affect.   Data Reviewed: I have personally reviewed following labs and imaging studies  CBC:  Recent Labs Lab 01/20/17 0649 01/23/17 0612 01/25/17 0442 01/26/17 0253  WBC 11.8* 12.9* 14.7* 15.2*  HGB 9.2* 8.7* 8.7* 9.0*  HCT 28.2* 26.2* 27.5* 27.9*  MCV 90.4 89.4 89.0 88.3  PLT 498* 419* 401* 856   Basic Metabolic Panel:  Recent Labs Lab 01/21/17 0502 01/23/17 0612 01/24/17 0746 01/25/17 0442 01/26/17 0253  NA 135 135 136 133* 133*  K 5.0 4.5 4.6 4.4 4.5  CL 101 98* 100* 95* 97*  CO2 28 27 31 28 27   GLUCOSE 136* 140* 135* 172* 142*  BUN 22* 22* 25* 27* 26*  CREATININE 0.81 0.77 0.78 0.86 0.82  CALCIUM 9.1 8.9 8.9 8.8* 8.8*  MG  --   --   --  2.3  --   PHOS  --   --   --  3.4  --    GFR: Estimated Creatinine Clearance: 46.9 mL/min (by C-G formula based on SCr of 0.82 mg/dL). Liver Function Tests: No results for input(s): AST, ALT, ALKPHOS, BILITOT, PROT, ALBUMIN in the last 168 hours. No results for input(s): LIPASE, AMYLASE in the last 168 hours. No results for input(s): AMMONIA in the last 168 hours. Coagulation Profile:  Recent Labs Lab 01/22/17 0625 01/23/17 0612 01/24/17 0746 01/25/17 0442 01/26/17 0253  INR 3.11 3.29 2.83 2.74 2.70   Cardiac Enzymes:  Recent Labs Lab 01/25/17 1930 01/26/17 0253  TROPONINI <0.03 <0.03   BNP (last 3 results) No results for input(s): PROBNP in the last 8760 hours. HbA1C:  Recent Labs  01/26/17 0253  HGBA1C 5.8*   CBG: No results for input(s): GLUCAP in the last 168 hours. Lipid Profile: No results for input(s): CHOL, HDL, LDLCALC, TRIG, CHOLHDL, LDLDIRECT in the last 72 hours. Thyroid Function Tests: No results for input(s): TSH, T4TOTAL, FREET4, T3FREE, THYROIDAB in the last 72 hours. Anemia Panel: No results for input(s):  VITAMINB12, FOLATE, FERRITIN, TIBC, IRON, RETICCTPCT in the last 72 hours. Sepsis Labs:  Recent Labs Lab 01/21/17 0502  PROCALCITON 0.12    Recent Results (from the past 240 hour(s))  MRSA PCR Screening     Status: None   Collection Time: 01/23/17  2:24 PM  Result Value Ref Range Status  MRSA by PCR NEGATIVE NEGATIVE Final    Comment:        The GeneXpert MRSA Assay (FDA approved for NASAL specimens only), is one component of a comprehensive MRSA colonization surveillance program. It is not intended to diagnose MRSA infection nor to guide or monitor treatment for MRSA infections.     Radiology Studies: Dg Chest Port 1 View  Result Date: 01/25/2017 CLINICAL DATA:  Shortness of breath. History of right lung cancer. History of hypertension and atrial fibrillation. EXAM: PORTABLE CHEST 1 VIEW COMPARISON:  01/23/2017. FINDINGS: PowerPort catheter with lead tip over the superior vena cava in stable position. Stable cardiomegaly. Stable right lung mass. Persistent but improved bilateral pulmonary interstitial infiltrates and or edema. A component changes may be related radiation treatment. No pleural effusion or pneumothorax. IMPRESSION: 1. PowerPort catheter in stable position. 2.  Stable right lung mass. 3. Stable cardiomegaly. Persistent but improved bilateral pulmonary interstitial infiltrates and or edema . A component these changes may be related radiation treatment. Electronically Signed   By: Marcello Moores  Register   On: 01/25/2017 07:40   Scheduled Meds: . busPIRone  15 mg Oral BID  . diltiazem  90 mg Oral Q12H  . DULoxetine  60 mg Oral Daily  . gabapentin  100 mg Oral QHS  . guaiFENesin  600 mg Oral BID  . ipratropium  0.5 mg Nebulization TID  . levalbuterol  1.25 mg Nebulization TID  . magic mouthwash  5 mL Oral TID PC & HS  . methylPREDNISolone (SOLU-MEDROL) injection  60 mg Intravenous Q12H  . metoprolol tartrate  25 mg Oral TID  . pantoprazole  40 mg Oral BID AC  .  potassium chloride  20 mEq Oral Q12H  . sodium chloride flush  10-40 mL Intracatheter Q12H  . Warfarin - Pharmacist Dosing Inpatient   Does not apply q1800   Continuous Infusions:   LOS: 10 days   Kerney Elbe, DO [facility] Pager [phone]  If 7PM-7AM, please contact night-coverage [email or web address] Password [facility] 01/26/2017, 7:31 AM

## 2017-01-26 NOTE — Evaluation (Signed)
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   Pt c/o SOB and dizziness when she stands.   Past Medical History:  Diagnosis Date  . A-fib ([place])   . Alcohol abuse, in remission   . Anxiety   . Atrial fibrillation ([place])   . Depression   . Dyspnea    walking distances   . Dysrhythmia   . History of bronchitis   . HTN (hypertension)   . Mass of right lung 07/13/2016  . Osteopenia   . Squamous cell carcinoma of right lung ([place]) 07/13/2016    Clinical Impression  Pt admitted with above diagnosis. Pt currently with functional limitations due to the deficits listed below (see PT Problem List). Pt was only able to stand at EOB for a 1 min due to dizziness and DOE 3/4.  Desat standing to 84% and BP fluctuation with each position change.  Assisted pt back to bed at end of treatment. Will follow acutely.   Pt will benefit from skilled PT to increase their independence and safety with mobility to allow discharge to the venue listed below.    Orthostatic BPs  Supine 106/67, 54 bpm  Sitting 86/61, 63 bpm  Standing 99/85,74 bpm  Standing after 3 min Would not measure  Once pt back in supine BP was 135/59 with HR 58 bpm. Pt symptomatic with position changes.  Sats 100% at rest on 6LO2HFNC; Sats 94% at EOB, sats 84% when pt stood all with 6LHFNC.    Follow Up Recommendations Home health PT;Supervision/Assistance - 24 hour    Equipment Recommendations  Wheelchair (measurements PT);Wheelchair cushion (measurements PT)    Recommendations for Other Services       Precautions / Restrictions  Precautions Precautions: Fall Precaution Comments: frequent falls, syncopy secondary to BP dropping Restrictions Weight Bearing Restrictions: No Other Position/Activity Restrictions: monitor O2 sats and BP      Mobility  Bed Mobility Overal bed mobility: Needs Assistance Bed Mobility: Supine to Sit;Sit to Supine     Supine to sit: Min assist Sit to supine: Min assist   General bed mobility comments: Needed some assist due to weakness.  Used rail.  Transfers Overall transfer level: Needs assistance Equipment used: 2 person hand held assist Transfers: Sit to/from Stand Sit to Stand: Min assist;+2 safety/equipment         General transfer comment: Pt held onto 2 persons for stability and was dizzy and DOE 3/4.  BP low therefore laid pt back down as she felt weak after standing for 1 min.  Desat to 84% as well on 6LHFNC.  Ambulation/Gait             General Gait Details: unable   Stairs            Wheelchair Mobility    Modified Rankin (Stroke Patients Only)       Balance Overall balance assessment: Needs assistance Sitting-balance support: No upper extremity supported;Feet supported Sitting balance-Leahy Scale: Fair     Standing balance support: Bilateral upper extremity supported;During functional activity Standing balance-Leahy Scale: Poor Standing balance comment: relies on UE support for stability.  Pertinent Vitals/Pain Pain Assessment: No/denies pain    Home Living Family/patient expects to be discharged to:: Private residence Living Arrangements: Other relatives;Children Available Help at Discharge: Available 24 hours/day;Family Type of Home: Apartment Home Access: Level entry;Stairs to enter Entrance Stairs-Rails: Right Entrance Stairs-Number of Steps: 2 Home Layout: One level Home Equipment: Walker - 2 wheels;Cane - single point;Bedside commode      Prior Function Level of Independence:  Independent         Comments: able to ambulate without AD and without assistance PTA; able to dress and bathe self; pt recently d/c from SNF and was receiveing HHPT     Hand Dominance   Dominant Hand: Right    Extremity/Trunk Assessment   Upper Extremity Assessment Upper Extremity Assessment: Defer to OT evaluation    Lower Extremity Assessment Lower Extremity Assessment: Generalized weakness    Cervical / Trunk Assessment Cervical / Trunk Assessment: Normal  Communication   Communication: No difficulties  Cognition Arousal/Alertness: Awake/alert Behavior During Therapy: WFL for tasks assessed/performed Overall Cognitive Status: Within Functional Limits for tasks assessed                                        General Comments      Exercises     Assessment/Plan    PT Assessment Patient needs continued PT services  PT Problem List Decreased strength;Decreased activity tolerance;Cardiopulmonary status limiting activity;Decreased balance       PT Treatment Interventions DME instruction;Gait training;Stair training;Functional mobility training;Therapeutic activities;Therapeutic exercise;Balance training;Patient/family education    PT Goals (Current goals can be found in the Care Plan section)  Acute Rehab PT Goals Patient Stated Goal: to go home PT Goal Formulation: With patient/family Time For Goal Achievement: 02/09/17 Potential to Achieve Goals: Good    Frequency Min 3X/week   Barriers to discharge        Co-evaluation               AM-PAC PT "6 Clicks" Daily Activity  Outcome Measure Difficulty turning over in bed (including adjusting bedclothes, sheets and blankets)?: Unable Difficulty moving from lying on back to sitting on the side of the bed? : Unable Difficulty sitting down on and standing up from a chair with arms (e.g., wheelchair, bedside commode, etc,.)?: Unable Help needed moving to and from a bed to chair (including  a wheelchair)?: Total Help needed walking in hospital room?: Total Help needed climbing 3-5 steps with a railing? : Total 6 Click Score: 6    End of Session Equipment Utilized During Treatment: Gait belt;Oxygen Activity Tolerance: Patient limited by fatigue Patient left: in bed;with call bell/phone within reach;with family/visitor present Nurse Communication: Mobility status PT Visit Diagnosis: Unsteadiness on feet (R26.81);Other abnormalities of gait and mobility (R26.89);Muscle weakness (generalized) (M62.81)    Time: 6568-1275 PT Time Calculation (min) (ACUTE ONLY): 18 min   Charges:   PT Evaluation $PT Eval Moderate Complexity: 1 Mod     PT G Codes:        Shyheim Tanney,PT Acute Rehabilitation [phone] (pager)   Denice Paradise 01/26/2017, 3:48 PM

## 2017-01-26 NOTE — Progress Notes (Signed)
Subjective:  No new symptoms. No recurrence of A. Fib overnight. Tolerating Metoprolol.   Objective:  Vital Signs in the last 24 hours: Temp:  [97.3 F (36.3 C)-97.6 F (36.4 C)] 97.3 F (36.3 C) (09/26 0855) Pulse Rate:  [56-111] 95 (09/26 0900) Resp:  [16-31] 24 (09/26 0900) BP: (93-143)/(55-88) 116/82 (09/26 0900) SpO2:  [78 %-100 %] 87 % (09/26 0900) Weight:  [60.6 kg (133 lb 9.6 oz)] 60.6 kg (133 lb 9.6 oz) (09/26 0300)  Intake/Output from previous day: 09/25 0701 - 09/26 0700 In: 600 [P.O.:600] Out: 2200 [Urine:2200]  Physical Exam: Constitutional: She is oriented to person, place, and time. Moderately built and frail, in no acute distress.   HENT: Head: Atraumatic.  Eyes: Conjunctivae and EOM are normal.  Neck: Neck supple. No JVD present. No tracheal deviation present. No thyromegaly present.  Cardiovascular: Normal rate and normal heart sounds.   Faint bilateral carotid bruit right more prominent than the left. Femoral pulse normal. Right popliteal pulse 1-2+ compared to left which is 2+. Pedal pulses are faint.  Pulmonary/Chest: No stridor. No respiratory distress. She has wheezes. She has rales. She exhibits no tenderness.  Abdominal: Soft. Bowel sounds are normal.  Musculoskeletal: Normal range of motion.  Neurological: She is alert and oriented to person, place, and time.  Lab Results: BMP  Recent Labs  01/24/17 0746 01/25/17 0442 01/26/17 0253  NA 136 133* 133*  K 4.6 4.4 4.5  CL 100* 95* 97*  CO2 31 28 27   GLUCOSE 135* 172* 142*  BUN 25* 27* 26*  CREATININE 0.78 0.86 0.82  CALCIUM 8.9 8.8* 8.8*  GFRNONAA >60 >60 >60  GFRAA >60 >60 >60    CBC  Recent Labs Lab 01/26/17 0253  WBC 15.2*  RBC 3.16*  HGB 9.0*  HCT 27.9*  PLT 374  MCV 88.3  MCH 28.5  MCHC 32.3  RDW 16.5*    HEMOGLOBIN A1C Lab Results  Component Value Date   HGBA1C 5.8 (H) 01/26/2017   MPG 119.76 01/26/2017    Cardiac Panel (last 3 results)  Recent Labs   01/19/17 0234 01/25/17 1930 01/26/17 0253  TROPONINI <0.03 <0.03 <0.03    TSH  Recent Labs  02/05/16 1441 11/27/16 2008  TSH 1.010 0.327*    Lipid Panel     Component Value Date/Time   CHOL 161 05/21/2016 1449   TRIG 63 05/21/2016 1449   TRIG 75 07/25/2013 1019   HDL 48 05/21/2016 1449   HDL 44 07/25/2013 1019   CHOLHDL 3.4 05/21/2016 1449   LDLCALC 100 (H) 05/21/2016 1449   LDLCALC 100 (H) 07/25/2013 1019     Hepatic Function Panel  Recent Labs  11/21/16 0701 01/06/17 1238 01/17/17 0624  PROT 5.6* 7.0 6.7  ALBUMIN 2.6* 3.8 2.3*  AST 14* 17 32  ALT 14 9 36  ALKPHOS 49 81 70  BILITOT 0.7 0.5 0.5   CARDIAC STUDIES:  EKG 01/25/2017: A. fib with RVR at a rate of 125 beats a minute, LVH. Normal QT interval. No evidence of ischemia. Compared to morning EKG, normal sinus rhythm, LVH.  Echocardiogram 01/17/2017: Mild LVH with LVEF 60-65% and grade 1 diastolic dysfunction. Mild   left atrial enlargement. Trivial mitral regurgitation. Mildly   calcified aortic annulus. Mild tricuspid regurgitation with PASP   estimated 42 mmHg.  Carotid artery duplex 11/29/2016: There is less than 50% stenosis in the right and left internal carotid arteries. There is plaque in both bulbs right greater than left.  ASSESSMENT AND  PLAN:  1. Paroxysmal atrial fibrillation with rapid ventricular response CHA2DS2-VASCScore: Risk Score  4,  Yearly risk of stroke  4. Recommendation: ASA No/Anticoagulation Yes  2. Hypertension 3. Shortness of breath 4. Hypoxemia 5.  Interstitial lung disease, suspect radiation injury along with amiodarone toxicity possible. 6. Hyperglycemia 7. Abnormal TSH, probably sick euthyroid syndrome 8. Anemia of chronic disease.  Rec: She is tolerating Metoprolol. Would not be too aggressive in HR control in view of hypoxemia and asymptomatic from A. Fib standpoint.  Will decrease Metoprolol to 25 mg BID from TID. Continue Dilt. I have discontinued  Atorvastatin (do not think it will make a difference long term). Continue Warfarin. I have also discontinued Lasix as there is no CHF on exam or clinically.    Adrian Prows, M.D. 01/26/2017, 11:29 AM [facility] Pager: [phone] Office: [phone] If no answer: [phone]

## 2017-01-26 NOTE — Progress Notes (Signed)
ANTICOAGULATION CONSULT NOTE - Follow Up Consult  Pharmacy Consult for Coumadin Indication: atrial fibrillation  No Known Allergies  Patient Measurements: Height: 5\' 2"  (157.5 cm) Weight: 133 lb 9.6 oz (60.6 kg) IBW/kg (Calculated) : 50.1  Vital Signs: Temp: 97.5 F (36.4 C) (09/26 1217) Temp Source: Axillary (09/26 1217) BP: 134/64 (09/26 1015) Pulse Rate: 52 (09/26 1327)  Labs:  Recent Labs  01/24/17 0746 01/25/17 0442 01/25/17 1930 01/26/17 0253  HGB  --  8.7*  --  9.0*  HCT  --  27.5*  --  27.9*  PLT  --  401*  --  374  LABPROT 29.5* 28.8*  --  28.4*  INR 2.83 2.74  --  2.70  CREATININE 0.78 0.86  --  0.82  TROPONINI  --   --  <0.03 <0.03    Estimated Creatinine Clearance: 46.9 mL/min (by C-G formula based on SCr of 0.82 mg/dL).  Assessment:   82 yr old female continues on Coumadin for atrial fibrillation.    INR remains therapeutic at 2.70.  Dose held on 9/23 when INR 3.29, resumed on 9/24 and has received Coumadin 1.5 mg the last 2 days, usual dose. Amiodarone stopped on 9/23 with concern for possible pulmonary toxicity.      Home Coumadin regimen: 1.5 mg daily except 3 mg on Sunday and Thursday.        INR on admit to [facility] on 9/14 = 2.76  Goal of Therapy:  INR 2-3 Monitor platelets by anticoagulation protocol: Yes   Plan:   Coumadin 1.5 mg again today; usual Wednesday dose.  Daily PT/INR.  Watch for change in Coumadin needs with amiodarone dc'd 01/23/17.  Arty Baumgartner, [place] Pager: [phone] 01/26/2017,3:55 PM

## 2017-01-26 NOTE — Progress Notes (Signed)
PULMONARY / CRITICAL CARE MEDICINE   Name: Alexandria Chen MRN: [id number] DOB: October 19, 1934    ADMISSION DATE:  01/25/2017 CONSULTATION DATE:  01/23/17  REFERRING MD:  Dr. 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 She is completed 8 days of vancomycin and cefepime. She reports that she feels better. She has shortness of breath and dyspnea on exertion and dizziness when she stands up.  SUBJECTIVE:    Continued HFNC down to 6L.  While resting, SpO2 is 98 - 100%.  Continues to desat with minimal exertion.  Diuresed 6 L  VITAL SIGNS: BP 116/82   Pulse 95   Temp (!) 97.3 F (36.3 C)   Resp (!) 24   Ht 5\' 2"  (1.575 m)   Wt 133 lb 9.6 oz (60.6 kg)   SpO2 (!) 87%   BMI 24.44 kg/m   HEMODYNAMICS:    VENTILATOR SETTINGS:    INTAKE / OUTPUT: I/O last 3 completed shifts: In: 32 [P.O.:600; I.V.:10] Out: 3300 [Urine:3300]  PHYSICAL EXAMINATION: General:  Frail, elderly female lying in bed in NAD HEENT: MM pink/dry, no JVD Neuro: Awake, f/c, MAE, generalized weakness CV: rrr, no murmur, accessed right chest port PULM: even/non-labored, RR 22,  lungs bilaterally clear w/bibasilar crackles, no wheeze GI: soft, non-tender, bs active, purwick in place Extremities: warm/dry, no edema  Skin: no rashes  LABS:  BMET  Recent Labs Lab 01/24/17 0746 01/25/17 0442 01/26/17 0253  NA 136 133* 133*  K 4.6 4.4 4.5  CL 100* 95* 97*  CO2 31 28 27   BUN 25* 27* 26*  CREATININE 0.78 0.86 0.82  GLUCOSE 135* 172* 142*    Electrolytes  Recent Labs Lab 01/24/17 0746 01/25/17 0442 01/26/17 0253  CALCIUM 8.9 8.8* 8.8*  MG  --  2.3  --   PHOS  --  3.4  --      CBC  Recent Labs Lab 01/23/17 0612 01/25/17 0442 01/26/17 0253  WBC 12.9* 14.7* 15.2*  HGB 8.7* 8.7* 9.0*  HCT 26.2* 27.5* 27.9*  PLT 419* 401* 374    Coag's  Recent Labs Lab 01/24/17 0746 01/25/17 0442 01/26/17 0253  INR 2.83 2.74 2.70    Sepsis Markers  Recent Labs Lab 01/21/17 0502  PROCALCITON 0.12    ABG  Recent Labs Lab 01/21/17 1417  PHART 7.418  PCO2ART 39.7  PO2ART 57.3*    Liver Enzymes No results for input(s): AST, ALT, ALKPHOS, BILITOT, ALBUMIN in the last 168 hours.  Cardiac Enzymes  Recent Labs Lab 01/25/17 1930 01/26/17 0253  TROPONINI <0.03 <0.03    Glucose No results for input(s): GLUCAP in the last 168 hours.  Imaging No results found.  STUDIES:  Outpatient CT chest 9/12 shows diffuse new groundglass opacities throughout right upper lobe, new since 1 month prior Chest x-ray 9/23 shows worsening and now bilateral diffuse interstitial infiltrates, radiologist read is likely related to pulmonary edema.  CULTURES: Blood culture is negative  ANTIBIOTICS: vancomycin and cefepime 8 days 9/15 > 9/22  SIGNIFICANT EVENTS: 9/14 admission to hospital for shortness of breath 9/23 Transferred to [facility] ICU  LINES/TUBES: Right port  DISCUSSION: 82 year old with lung cancer status post chemoradiation admitted with hypoxic respiratory  failure, with lung infiltrates. She has received adequate treatment for HCAP. Her progression may be related to radiation pneumonitis or amiodarone toxicity. She likely has a component of volume overload.  ASSESSMENT / PLAN:  Acute hypoxemic respiratory failure - having desaturations with any activity Concern for radiation induced lung injury vs amiodarone toxicity (sed rate 125 on 01/24/17) Possible HCAP - s/p 8 days vanc / cefepime P:   Continue supplemental oxygen. Wean down as tolerated Continue steroids, 60 mg Q12 hrs (tapered 9/25) Continue BD's Continue to monitor off  abx Continue PT to assist with mobilization as tolerated Diuresis held per Cards, weight on 9/14 ~ 59 kgs, near baseline at 60.6 today Repeat CXR 9/28  Atrial fibrillation (was on amiodarone which was stopped 9/23).  Currently in NSR. -9/26, tolerating metoprolol, continues in NSR P:  Continue Atorvastatin, aspirin, nitroglycerin, metoprolol per Cards Telemetry monitoring Continue warfarin anticoagulation   Rest per primary team   FAMILY  - Updates: Daughter updated at bedside 9/26.   - Inter-disciplinary family meet or Palliative Care meeting due by:  01/30/17   Kennieth Rad, AGACNP-BC [place] Pgr: [phone] or if no answer [phone] 01/26/2017, 12:40 PM

## 2017-01-27 ENCOUNTER — Inpatient Hospital Stay (HOSPITAL_COMMUNITY): Payer: Medicare Other

## 2017-01-27 DIAGNOSIS — J700 Acute pulmonary manifestations due to radiation: Principal | ICD-10-CM

## 2017-01-27 LAB — CBC WITH DIFFERENTIAL/PLATELET
BASOS ABS: 0 10*3/uL (ref 0.0–0.1)
Basophils Relative: 0 %
Eosinophils Absolute: 0 10*3/uL (ref 0.0–0.7)
Eosinophils Relative: 0 %
HEMATOCRIT: 29.4 % — AB (ref 36.0–46.0)
Hemoglobin: 9.5 g/dL — ABNORMAL LOW (ref 12.0–15.0)
LYMPHS ABS: 0.5 10*3/uL — AB (ref 0.7–4.0)
LYMPHS PCT: 3 %
MCH: 28.5 pg (ref 26.0–34.0)
MCHC: 32.3 g/dL (ref 30.0–36.0)
MCV: 88.3 fL (ref 78.0–100.0)
Monocytes Absolute: 0.8 10*3/uL (ref 0.1–1.0)
Monocytes Relative: 5 %
NEUTROS ABS: 15.2 10*3/uL — AB (ref 1.7–7.7)
Neutrophils Relative %: 92 %
Platelets: 407 10*3/uL — ABNORMAL HIGH (ref 150–400)
RBC: 3.33 MIL/uL — AB (ref 3.87–5.11)
RDW: 16.6 % — ABNORMAL HIGH (ref 11.5–15.5)
WBC: 16.4 10*3/uL — AB (ref 4.0–10.5)

## 2017-01-27 LAB — COMPREHENSIVE METABOLIC PANEL
ALK PHOS: 78 U/L (ref 38–126)
ALT: 52 U/L (ref 14–54)
AST: 24 U/L (ref 15–41)
Albumin: 2.1 g/dL — ABNORMAL LOW (ref 3.5–5.0)
Anion gap: 7 (ref 5–15)
BUN: 27 mg/dL — AB (ref 6–20)
CALCIUM: 9 mg/dL (ref 8.9–10.3)
CO2: 28 mmol/L (ref 22–32)
CREATININE: 0.78 mg/dL (ref 0.44–1.00)
Chloride: 98 mmol/L — ABNORMAL LOW (ref 101–111)
Glucose, Bld: 150 mg/dL — ABNORMAL HIGH (ref 65–99)
Potassium: 4.9 mmol/L (ref 3.5–5.1)
Sodium: 133 mmol/L — ABNORMAL LOW (ref 135–145)
TOTAL PROTEIN: 6.1 g/dL — AB (ref 6.5–8.1)
Total Bilirubin: 0.4 mg/dL (ref 0.3–1.2)

## 2017-01-27 LAB — PROTIME-INR
INR: 2.59
Prothrombin Time: 27.6 seconds — ABNORMAL HIGH (ref 11.4–15.2)

## 2017-01-27 LAB — PHOSPHORUS: Phosphorus: 3.2 mg/dL (ref 2.5–4.6)

## 2017-01-27 LAB — MAGNESIUM: MAGNESIUM: 2.3 mg/dL (ref 1.7–2.4)

## 2017-01-27 MED ORDER — METHYLPREDNISOLONE SODIUM SUCC 40 MG IJ SOLR
40.0000 mg | Freq: Two times a day (BID) | INTRAMUSCULAR | Status: DC
  Administered 2017-01-27 – 2017-01-28 (×2): 40 mg via INTRAVENOUS
  Filled 2017-01-27 (×2): qty 1

## 2017-01-27 MED ORDER — LORAZEPAM 2 MG/ML IJ SOLN
0.5000 mg | INTRAMUSCULAR | Status: DC | PRN
  Administered 2017-01-30: 1 mg via INTRAVENOUS
  Filled 2017-01-27 (×2): qty 1

## 2017-01-27 MED ORDER — WARFARIN SODIUM 3 MG PO TABS
3.0000 mg | ORAL_TABLET | Freq: Once | ORAL | Status: AC
  Administered 2017-01-27: 3 mg via ORAL
  Filled 2017-01-27: qty 1

## 2017-01-27 MED ORDER — SALINE SPRAY 0.65 % NA SOLN: 1.0000 | NASAL | Status: DC | PRN

## 2017-01-27 NOTE — Progress Notes (Signed)
ANTICOAGULATION CONSULT NOTE - Follow Up Consult  Pharmacy Consult for Coumadin Indication: atrial fibrillation  No Known Allergies  Patient Measurements: Height: 5\' 2"  (157.5 cm) Weight: 131 lb 13.4 oz (59.8 kg) IBW/kg (Calculated) : 50.1  Vital Signs: Temp: 97.4 F (36.3 C) (09/27 0901) Temp Source: Axillary (09/27 0901) BP: 126/64 (09/27 0901) Pulse Rate: 57 (09/27 0901)  Labs:  Recent Labs  01/25/17 0442 01/25/17 1930 01/26/17 0253 01/27/17 0333  HGB 8.7*  --  9.0* 9.5*  HCT 27.5*  --  27.9* 29.4*  PLT 401*  --  374 407*  LABPROT 28.8*  --  28.4* 27.6*  INR 2.74  --  2.70 2.59  CREATININE 0.86  --  0.82 0.78  TROPONINI  --  <0.03 <0.03  --    Estimated Creatinine Clearance: 44.4 mL/min (by C-G formula based on SCr of 0.78 mg/dL).  Assessment:   82 yr old female continues on Coumadin for atrial fibrillation.    INR remains therapeutic at 2.59.  Dose held on 9/23 when INR 3.29, resumed on 9/24 and has received Coumadin 1.5 mg the last 2 days, usual dose. Amiodarone stopped on 9/23 with concern for possible pulmonary toxicity.        Home Coumadin regimen: 1.5 mg daily except 3 mg on Sunday and Thursday.        INR on admit to [facility] on 9/14 = 2.76  Goal of Therapy:  INR 2-3 Monitor platelets by anticoagulation protocol: Yes   Plan:   Will give normal home dose regimen of Coumadin 3 mg today and resume this plan if she maintains a therapeutic response.  Daily PT/INR.  Watch for change in Coumadin needs with amiodarone dc'd 01/23/17.  Rober Minion, PharmD., MS Clinical Pharmacist Pager:  [phone] Thank you for allowing pharmacy to be part of this patients care team. 01/27/2017,9:49 AM

## 2017-01-27 NOTE — Progress Notes (Signed)
Patient switched from NRB back to 15LHFNC. SP02 97%. Will continue to monitor and wean as tolerated.

## 2017-01-27 NOTE — Progress Notes (Signed)
Patient had increased WOB on 15L HFNC. Patient's 02 saturations dropped to 71%. Patient placed on bipap 10/5 80% but patient's rate still in high 50's, wasn't tolerating well on the bipap so placed patient on 100% NRB. Patient looks more comfortable on this. RT will continue to monitor. BIPAP is on standby at bedside.

## 2017-01-27 NOTE — Progress Notes (Signed)
PULMONARY / CRITICAL CARE MEDICINE   Name: LONDEN LORGE MRN: [id number] DOB: 1935-03-11    ADMISSION DATE:  01/23/2017 CONSULTATION DATE:  01/23/17  REFERRING MD:  Dr. 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 She is completed 8 days of vancomycin and cefepime. She reports that she feels better. She has shortness of breath and dyspnea on exertion and dizziness when she stands up.  SUBJECTIVE:    Improving slowly.  Down to 5L [place] at rest.  Easily desats with minimal activity still per RN.    VITAL SIGNS: BP 126/64 (BP Location: Left Arm)   Pulse (!) 57   Temp (!) 97.4 F (36.3 C) (Axillary)   Resp (!) 35   Ht 5\' 2"  (1.575 m)   Wt 59.8 kg (131 lb 13.4 oz)   SpO2 100%   BMI 24.11 kg/m   INTAKE / OUTPUT: I/O last 3 completed shifts: In: 4 [P.O.:480; I.V.:10] Out: 1550 [Urine:1550]  PHYSICAL EXAMINATION: General:  Pleasant elderly female, NAD  HEENT: MM pink/dry, no JVD Neuro: Awake, f/c, MAE, generalized weakness CV: rrr, no murmur PULM: resps even non labored on 5L [place], few scattered crackles, no wheeze GI: soft, non-tender, bs active, purwick in place Extremities: warm/dry, no edema  Skin: no rashes  LABS:  BMET  Recent Labs Lab 01/25/17 0442 01/26/17 0253 01/27/17 0333  NA 133* 133* 133*  K 4.4 4.5 4.9  CL 95* 97* 98*  CO2 28 27 28   BUN 27* 26* 27*  CREATININE 0.86 0.82 0.78  GLUCOSE 172* 142* 150*    Electrolytes  Recent Labs Lab 01/25/17 0442 01/26/17 0253 01/27/17 0333  CALCIUM 8.8* 8.8* 9.0  MG 2.3  --  2.3  PHOS 3.4  --  3.2    CBC  Recent Labs Lab 01/25/17 0442 01/26/17 0253 01/27/17 0333  WBC 14.7* 15.2*  16.4*  HGB 8.7* 9.0* 9.5*  HCT 27.5* 27.9* 29.4*  PLT 401* 374 407*    Coag's  Recent Labs Lab 01/25/17 0442 01/26/17 0253 01/27/17 0333  INR 2.74 2.70 2.59    Sepsis Markers  Recent Labs Lab 01/21/17 0502  PROCALCITON 0.12    ABG  Recent Labs Lab 01/21/17 1417  PHART 7.418  PCO2ART 39.7  PO2ART 57.3*    Liver Enzymes  Recent Labs Lab 01/27/17 0333  AST 24  ALT 52  ALKPHOS 78  BILITOT 0.4  ALBUMIN 2.1*    Cardiac Enzymes  Recent Labs Lab 01/25/17 1930 01/26/17 0253  TROPONINI <0.03 <0.03    Glucose No results for input(s): GLUCAP in the last 168 hours.  Imaging No results found.  STUDIES:  Outpatient CT chest 9/12 shows diffuse new groundglass opacities throughout right upper lobe, new since 1 month prior Chest x-ray 9/23 shows worsening and now bilateral diffuse interstitial infiltrates, radiologist read is likely related to pulmonary edema. HRCT 9/27>>>  CULTURES: Blood culture is negative  ANTIBIOTICS: vancomycin and cefepime 8 days 9/15 > 9/22  SIGNIFICANT EVENTS: 9/14 admission to hospital for shortness of breath 9/23 Transferred to [facility] ICU  LINES/TUBES: Right port  DISCUSSION: 82 year old with lung cancer status post chemoradiation admitted with hypoxic respiratory failure, with lung infiltrates. She has received  adequate treatment for HCAP. Her progression may be related to radiation pneumonitis or amiodarone toxicity. She likely has a component of volume overload.  ASSESSMENT / PLAN:  Acute hypoxemic respiratory failure - improving very slowly.   Concern for radiation pneumonitis vs amiodarone toxicity (sed rate 125 on 01/24/17) Possible HCAP - s/p 8 days vanc / cefepime P:   Holding amiodarone Continue supplemental oxygen. Wean down as tolerated Taper steroids to 40 mg Q12 hrs and leave Continue BD's  Continue to monitor off abx Continue PT to assist with mobilization as tolerated Will check HRCT chest  today for completeness - would expect improvement to be slow if this is amiodarone toxicity but she seems to have stalled a bit.   Atrial fibrillation (was on amiodarone which was stopped 9/23).  Currently in NSR. -9/26, tolerating metoprolol, continues in NSR P:  Continue Atorvastatin, aspirin, nitroglycerin, metoprolol per Cards Telemetry monitoring Continue warfarin anticoagulation   Rest per primary team   FAMILY  - Updates: Daughter updated at bedside 9/27.   - Inter-disciplinary family meet or Palliative Care meeting due by:  01/30/17   Nickolas Madrid, NP 01/27/2017  11:23 AM Pager: [phone] or [phone]

## 2017-01-27 NOTE — Progress Notes (Signed)
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   Pt c/o SOB and dizziness when she stands.     PT Comments    Pt admitted with above diagnosis. Pt currently with functional limitations due to balance and endurance deficits. Pt was able to transfer to chair with +2 min assist but desat to 84% on 5LHFNC needing 6LHFNC to recover to >90%.  Then decr O2 to 5LHFNC and sats 92% on departure.  Pt limited by poor endurance.  Will follow acutely.  Pt will benefit from skilled PT to increase their independence and safety with mobility to allow discharge to the venue listed below.     Follow Up Recommendations  Home health PT;Supervision/Assistance - 24 hour     Equipment Recommendations  Wheelchair (measurements PT);Wheelchair cushion (measurements PT)    Recommendations for Other Services       Precautions / Restrictions Precautions Precautions: Fall Precaution Comments: frequent falls, syncopy secondary to BP dropping Restrictions Weight Bearing Restrictions: No Other Position/Activity Restrictions: monitor O2 sats and BP    Mobility  Bed Mobility Overal bed mobility: Needs Assistance Bed Mobility: Supine to Sit;Sit to Supine     Supine to sit: Min assist     General bed mobility comments: Needed some assist due to weakness.  Used rail.  Transfers Overall transfer level: Needs assistance Equipment used: 2 person hand held assist Transfers: Sit to/from Stand Sit to Stand: Min assist;+2 physical assistance         General transfer comment: Pt held onto 2 persons for stability and took  pivotal steps to chair.  Pt was dizzy and DOE 3/4.  Desat to 84% as well on 5LHFNC.  Incr to 6LHFNC and pt stabilized above 90% within 1 minute.BP 146/71.    Ambulation/Gait                 Stairs            Wheelchair Mobility    Modified Rankin (Stroke Patients Only)       Balance Overall balance assessment: Needs assistance Sitting-balance support: No upper extremity supported;Feet supported Sitting balance-Leahy Scale: Fair     Standing balance support: Bilateral upper extremity supported;During functional activity Standing balance-Leahy Scale: Poor Standing balance comment: relies on UE support for stability.                            Cognition Arousal/Alertness: Awake/alert Behavior During Therapy: WFL for tasks assessed/performed Overall Cognitive Status: Within Functional Limits for tasks assessed                                        Exercises      General Comments        Pertinent Vitals/Pain Pain Assessment: No/denies pain    Home Living                      Prior Function            PT Goals (  current goals can now be found in the care plan section) Progress towards PT goals: Progressing toward goals    Frequency    Min 3X/week      PT Plan Current plan remains appropriate    Co-evaluation              AM-PAC PT "6 Clicks" Daily Activity  Outcome Measure  Difficulty turning over in bed (including adjusting bedclothes, sheets and blankets)?: Unable Difficulty moving from lying on back to sitting on the side of the bed? : Unable Difficulty sitting down on and standing up from a chair with arms (e.g., wheelchair, bedside commode, etc,.)?: Unable Help needed moving to and from a bed to chair (including a wheelchair)?: Total Help needed walking in hospital room?: Total Help needed climbing 3-5 steps with a railing? : Total 6 Click Score: 6    End of Session Equipment Utilized  During Treatment: Gait belt;Oxygen Activity Tolerance: Patient limited by fatigue Patient left: with call bell/phone within reach;with family/visitor present;in chair;with chair alarm set Nurse Communication: Mobility status PT Visit Diagnosis: Unsteadiness on feet (R26.81);Other abnormalities of gait and mobility (R26.89);Muscle weakness (generalized) (M62.81)     Time: 2595-6387 PT Time Calculation (min) (ACUTE ONLY): 12 min  Charges:  $Therapeutic Activity: 8-22 mins                    G Codes:       Colby Reels,PT Acute Rehabilitation [phone] (pager)    Denice Paradise 01/27/2017, 2:48 PM

## 2017-01-27 NOTE — Progress Notes (Signed)
PROGRESS NOTE    Alexandria Chen  JSE:[id number] DOB: 10-Jun-1934 DOA: 01/17/2017 PCP: [name], FNP  Brief Narrative: Alexandria Chen  Is a 82 year old woman admitted from home on 9/14 due being short of breath. She has history of atrial fibrillation, anxiety, depression hypertension and lung cancer recent finishing chemotherapy and radiation. She was recently treated for pneumonia back in August to [facility] on 9/14 with difficulty breathing and weakness. Patient was found to be hypoxic and was brought into the hospital for evaluation.She was started on antibiotics as there was concern of healthcare associated pneumonia, also was started on Lasix as there also was concern of acute diastolic dysfunction. Patient did not show signs of improvement and had increasing oxygen requirement up to 100% non-rebreather mask. Patient subsequently was transferred to [facility] given the lack of pulmonary service at [facility] and she was admitted to the SDU. Patient was continued on oxygen supplementation and was started on steroids subsequently transferred to [facility] for further management. She remains dyspenic and desaturates on minimal exertion and it is felt that it is related to Amiodarone Toxicity vs. Radiation Pneumonitis and that it may take several weeks for her to clinically improve. Pulmonary ordering a HRCT today.   Assessment & Plan:   Principal Problem:   Hypoxia Active Problems:   Depression   Essential hypertension, benign   Hyperlipidemia   Atrial fibrillation (HCC) [I48.91]   HCAP (healthcare-associated pneumonia)   Hypokalemia   Bronchospasm   Acute respiratory failure (HCC)  Acute respiratory failure with hypoxia due in the setting of ILD and Squamous Cell Lung Cancer with component of radiation pneumonitis versus Amiodarone Toxicity -Failure to improve, worsening over a week course -Having episodes of syncope due to hypoxia  -Continued Steroids at 60 mg q12h and  wean to 40 mg IV q12h per Pulmonary -Lasix D/C'd by Cardiology Dr. Einar Gip -Completed course of antibiotic due to concerns of pneumonia with Vancomycin and Cefepime  -C/w Levalbuterol 1.25 mg / Ipratropium 0.5 mg Neb TID -PCCM recommendations appreciated; Recommend to continue to Hold Amiodarone and Taper Steroids to 40 mg q12h and remain at this dose until clinical improvement -Per PCCM may take a few weeks to improve and recommend continuing Rehabilitation Efforts -Pulmonary ordering HRCT Chest Today for completeness -C/w Flutter Valve and Incentive Spirometry, Supportive Care with Guaifenesin 600 mg po BID -C/w PT to Assist with Mobilization -Pulmonary repeating CXR on 9/28; CXR 01/25/17 showed PowerPort catheter in stable position. Stable right lung mass. Stable cardiomegaly. Persistent but improved bilateral pulmonary interstitial infiltrates and or edema . A component these changes may be related radiation treatment. -Discussed with Patient's Primary Radiation Oncologist Dr. Quitman Livings and he feels that there is a component of Radiation Pneumonitis and recommended Steroids  -Weaned down to 6 Liters [place] yesterday and was on 5 Liters HFNC today -OOB with Assistance   Syncope due to Hypoxia with Exertion  -See Above -Continue to Mobilize and OOB with Assistance   Chronic diastolic HF  -Weight was up slightly - Lasix increased by Pulmonary Team but discontinued by Cardiology -Continue to monitor and repeat CMP -Strict I's/O's, Daily Weights; -Patient is -7.5035 Liters and Weight is down 5 lbs since 9.25 -Continue to Monitor Volume Status   A. Fib with RVR, improved and now Bradycardia -Patient was on amiodarone but due to possible toxicity this was discontinued -CHADS2-VASc of 4 -Was given 1 dose of metoprolol 5 mg IV -Cardizem 90 mg twice a day;  Cardiology added Metoprolol 25 mg po TID and then changing it 25 mg po BID -Cardiology consulted - Dr Einar Gip and appreciated  Recc's -C/w Metoprolol and Cardizem as above; Held Metoprolol dose this AM as patient was Bradycardic  -C/w Anticoagulation with Warfarin per Pharmacy Dosing    Hypertension -Stable and BP controlled  -C/w Hydralazine 10 mg IV q8hprn for SBP >180 or DBP >100  Chronic Pain Syndrome -Continue Gabapentin 100 mg po qHS  Depression with Anxiety -No suicidal or homicidal ideation  -Continue Duloxetine 60 mg po Daily and Buspirone 15 mg po BID   Leukocytosis -Likely Steroid Demargination vs. Reactive from Lung Cancer -Patient is s/p Treatment with Abx -WBC went from 11.8 -> 12.9 -> 14.7 -> 15.2 -> 16.4 -Continue to Monitor for S/Sx of Infection -Repeat CBC in AM   Hyponatremia -Mild at 133; Patient's Diuresis stopped 01/25/17 per Cards -Continue to Monitor and repeat CMP in AM   DVT prophylaxis: Anticoagulated with Warfarin Code Status: FULL CODE Family Communication: Discussed with Daughters at bedside Disposition Plan: Remain Inpatient at this time but [facility] PT at D/C; Remain in SDU at this time  Consultants:   PCCM  Cardiology   Procedures: None   Antimicrobials:  Anti-infectives    Start     Dose/Rate Route Frequency Ordered Stop   01/20/17 1000  vancomycin (VANCOCIN) IVPB 750 mg/150 ml premix  Status:  Discontinued     750 mg 150 mL/hr over 60 Minutes Intravenous Every 12 hours 01/20/17 0925 01/22/17 1249   01/17/17 1800  vancomycin (VANCOCIN) 500 mg in sodium chloride 0.9 % 100 mL IVPB  Status:  Discontinued     500 mg 100 mL/hr over 60 Minutes Intravenous Every 12 hours 01/17/17 0934 01/20/17 0925   01/17/17 0700  vancomycin (VANCOCIN) IVPB 1000 mg/200 mL premix     1,000 mg 200 mL/hr over 60 Minutes Intravenous  Once 01/17/17 0650 01/17/17 0800   01/16/17 0000  cefpodoxime (VANTIN) 200 MG tablet     200 mg Oral 2 times daily 01/16/17 0841 01/23/17 2359   01/15/17 0600  ceFEPIme (MAXIPIME) 1 g in dextrose 5 % 50 mL IVPB  Status:  Discontinued     1  g 100 mL/hr over 30 Minutes Intravenous Every 24 hours 01/13/2017 0819 01/22/17 1249   01/21/2017 0530  cefTRIAXone (ROCEPHIN) 1 g in dextrose 5 % 50 mL IVPB  Status:  Discontinued     1 g 100 mL/hr over 30 Minutes Intravenous  Once 01/29/2017 0518 01/07/2017 0526   01/21/2017 0530  ceFEPIme (MAXIPIME) 2 g in dextrose 5 % 50 mL IVPB     2 g 100 mL/hr over 30 Minutes Intravenous  Once 01/17/2017 0526 01/28/2017 0093     Subjective: Seen and examined and per daughters was restless overnight. No CP but still desaturates significantly with slight exertion. No dizziness or lightheadedness. No other concerns or complaints at this time.   Objective: Vitals:   01/27/17 0300 01/27/17 0410 01/27/17 0734 01/27/17 0901  BP: 137/81   126/64  Pulse: (!) 57   (!) 57  Resp: (!) 22   (!) 35  Temp: 98.5 F (36.9 C)   (!) 97.4 F (36.3 C)  TempSrc: Oral   Axillary  SpO2: 99%  100% 100%  Weight:  59.8 kg (131 lb 13.4 oz)    Height:        Intake/Output Summary (Last 24 hours) at 01/27/17 1231 Last data filed at 01/27/17 0900  Gross per  24 hour  Intake              490 ml  Output             1900 ml  Net            -1410 ml   Filed Weights   01/25/17 0611 01/26/17 0300 01/27/17 0410  Weight: 61.9 kg (136 lb 7.4 oz) 60.6 kg (133 lb 9.6 oz) 59.8 kg (131 lb 13.4 oz)   Examination: Physical Exam:  Constitutional: Frail elderly AAF in NAD Eyes: Sclerae anicteric. Lids normal ENMT: External Ears and nose appear normal. Grossly normal hearing. MMM Neck: Supple with no JVD Respiratory: Diminished with coarse breath sounds mild crackles. No appreciable wheezing; Wearing Supplemental HFNC Cardiovascular: Bradycardic Rate but regular rhythm, No murmur/gallop. No extremity edema Abdomen: Soft, NT, ND. Bowel sounds present GU: Deferred Musculoskeletal: No contractures; No cyanosis Skin: No rashes or lesions on a limited skin eval. Warm and dry Neurologic: CN 2-12 grossly intact. No appreciable Focal  deficits Psychiatric: Pleasant mood and affect. Intact judgement and insight  Data Reviewed: I have personally reviewed following labs and imaging studies  CBC:  Recent Labs Lab 01/23/17 0612 01/25/17 0442 01/26/17 0253 01/27/17 0333  WBC 12.9* 14.7* 15.2* 16.4*  NEUTROABS  --   --   --  15.2*  HGB 8.7* 8.7* 9.0* 9.5*  HCT 26.2* 27.5* 27.9* 29.4*  MCV 89.4 89.0 88.3 88.3  PLT 419* 401* 374 093*   Basic Metabolic Panel:  Recent Labs Lab 01/23/17 0612 01/24/17 0746 01/25/17 0442 01/26/17 0253 01/27/17 0333  NA 135 136 133* 133* 133*  K 4.5 4.6 4.4 4.5 4.9  CL 98* 100* 95* 97* 98*  CO2 27 31 28 27 28   GLUCOSE 140* 135* 172* 142* 150*  BUN 22* 25* 27* 26* 27*  CREATININE 0.77 0.78 0.86 0.82 0.78  CALCIUM 8.9 8.9 8.8* 8.8* 9.0  MG  --   --  2.3  --  2.3  PHOS  --   --  3.4  --  3.2   GFR: Estimated Creatinine Clearance: 44.4 mL/min (by C-G formula based on SCr of 0.78 mg/dL). Liver Function Tests:  Recent Labs Lab 01/27/17 0333  AST 24  ALT 52  ALKPHOS 78  BILITOT 0.4  PROT 6.1*  ALBUMIN 2.1*   No results for input(s): LIPASE, AMYLASE in the last 168 hours. No results for input(s): AMMONIA in the last 168 hours. Coagulation Profile:  Recent Labs Lab 01/23/17 0612 01/24/17 0746 01/25/17 0442 01/26/17 0253 01/27/17 0333  INR 3.29 2.83 2.74 2.70 2.59   Cardiac Enzymes:  Recent Labs Lab 01/25/17 1930 01/26/17 0253  TROPONINI <0.03 <0.03   BNP (last 3 results) No results for input(s): PROBNP in the last 8760 hours. HbA1C:  Recent Labs  01/26/17 0253  HGBA1C 5.8*   CBG: No results for input(s): GLUCAP in the last 168 hours. Lipid Profile: No results for input(s): CHOL, HDL, LDLCALC, TRIG, CHOLHDL, LDLDIRECT in the last 72 hours. Thyroid Function Tests: No results for input(s): TSH, T4TOTAL, FREET4, T3FREE, THYROIDAB in the last 72 hours. Anemia Panel: No results for input(s): VITAMINB12, FOLATE, FERRITIN, TIBC, IRON, RETICCTPCT in the  last 72 hours. Sepsis Labs:  Recent Labs Lab 01/21/17 0502  PROCALCITON 0.12    Recent Results (from the past 240 hour(s))  MRSA PCR Screening     Status: None   Collection Time: 01/23/17  2:24 PM  Result Value Ref Range Status   MRSA  by PCR NEGATIVE NEGATIVE Final    Comment:        The GeneXpert MRSA Assay (FDA approved for NASAL specimens only), is one component of a comprehensive MRSA colonization surveillance program. It is not intended to diagnose MRSA infection nor to guide or monitor treatment for MRSA infections.     Radiology Studies: No results found. Scheduled Meds: . busPIRone  15 mg Oral BID  . diltiazem  90 mg Oral Q12H  . DULoxetine  60 mg Oral Daily  . gabapentin  100 mg Oral QHS  . guaiFENesin  600 mg Oral BID  . ipratropium  0.5 mg Nebulization TID  . levalbuterol  1.25 mg Nebulization TID  . magic mouthwash  5 mL Oral TID PC & HS  . methylPREDNISolone (SOLU-MEDROL) injection  40 mg Intravenous Q12H  . metoprolol tartrate  25 mg Oral BID  . pantoprazole  40 mg Oral BID AC  . potassium chloride  20 mEq Oral Q12H  . sodium chloride flush  10-40 mL Intracatheter Q12H  . warfarin  3 mg Oral ONCE-1800  . Warfarin - Pharmacist Dosing Inpatient   Does not apply q1800   Continuous Infusions:   LOS: 11 days   Kerney Elbe, DO [facility] Pager [phone]  If 7PM-7AM, please contact night-coverage [email or web address] Password [facility] 01/27/2017, 12:31 PM

## 2017-01-27 NOTE — Discharge Instructions (Signed)

## 2017-01-28 ENCOUNTER — Inpatient Hospital Stay (HOSPITAL_COMMUNITY): Payer: Medicare Other

## 2017-01-28 DIAGNOSIS — E875 Hyperkalemia: Secondary | ICD-10-CM

## 2017-01-28 DIAGNOSIS — J9602 Acute respiratory failure with hypercapnia: Secondary | ICD-10-CM

## 2017-01-28 DIAGNOSIS — R140 Abdominal distension (gaseous): Secondary | ICD-10-CM

## 2017-01-28 DIAGNOSIS — E871 Hypo-osmolality and hyponatremia: Secondary | ICD-10-CM

## 2017-01-28 LAB — COMPREHENSIVE METABOLIC PANEL
ALBUMIN: 2.1 g/dL — AB (ref 3.5–5.0)
ALK PHOS: 80 U/L (ref 38–126)
ALT: 41 U/L (ref 14–54)
ANION GAP: 7 (ref 5–15)
AST: 20 U/L (ref 15–41)
BUN: 31 mg/dL — ABNORMAL HIGH (ref 6–20)
CALCIUM: 8.8 mg/dL — AB (ref 8.9–10.3)
CHLORIDE: 100 mmol/L — AB (ref 101–111)
CO2: 25 mmol/L (ref 22–32)
Creatinine, Ser: 0.88 mg/dL (ref 0.44–1.00)
GFR calc non Af Amer: >60 mL/min (ref >60)
GLUCOSE: 160 mg/dL — AB (ref 65–99)
POTASSIUM: 5.2 mmol/L — AB (ref 3.5–5.1)
SODIUM: 132 mmol/L — AB (ref 135–145)
Total Bilirubin: 0.7 mg/dL (ref 0.3–1.2)
Total Protein: 5.6 g/dL — ABNORMAL LOW (ref 6.5–8.1)

## 2017-01-28 LAB — CBC WITH DIFFERENTIAL/PLATELET
BASOS PCT: 0 %
Basophils Absolute: 0 10*3/uL (ref 0.0–0.1)
EOS ABS: 0 10*3/uL (ref 0.0–0.7)
EOS PCT: 0 %
HCT: 28.5 % — ABNORMAL LOW (ref 36.0–46.0)
HEMOGLOBIN: 9.1 g/dL — AB (ref 12.0–15.0)
LYMPHS ABS: 0.4 10*3/uL — AB (ref 0.7–4.0)
Lymphocytes Relative: 2 %
MCH: 28.1 pg (ref 26.0–34.0)
MCHC: 31.9 g/dL (ref 30.0–36.0)
MCV: 88 fL (ref 78.0–100.0)
MONOS PCT: 5 %
Monocytes Absolute: 0.9 10*3/uL (ref 0.1–1.0)
NEUTROS PCT: 93 %
Neutro Abs: 16 10*3/uL — ABNORMAL HIGH (ref 1.7–7.7)
PLATELETS: 350 10*3/uL (ref 150–400)
RBC: 3.24 MIL/uL — ABNORMAL LOW (ref 3.87–5.11)
RDW: 16.6 % — AB (ref 11.5–15.5)
WBC: 17.3 10*3/uL — ABNORMAL HIGH (ref 4.0–10.5)

## 2017-01-28 LAB — PROTIME-INR
INR: 3.27
PROTHROMBIN TIME: 33.1 s — AB (ref 11.4–15.2)

## 2017-01-28 LAB — MAGNESIUM: MAGNESIUM: 2.5 mg/dL — AB (ref 1.7–2.4)

## 2017-01-28 LAB — PHOSPHORUS: Phosphorus: 3.5 mg/dL (ref 2.5–4.6)

## 2017-01-28 MED ORDER — METHYLPREDNISOLONE SODIUM SUCC 125 MG IJ SOLR
125.0000 mg | Freq: Four times a day (QID) | INTRAMUSCULAR | Status: DC
  Administered 2017-01-28 – 2017-01-31 (×12): 125 mg via INTRAVENOUS
  Filled 2017-01-28 (×12): qty 2

## 2017-01-28 NOTE — Progress Notes (Signed)
ANTICOAGULATION CONSULT NOTE - Follow Up Consult  Pharmacy Consult for Coumadin Indication: atrial fibrillation  Allergies  Allergen Reactions  . Amiodarone     Discontinued 01/23/17 for possible pulmonary toxicity    Patient Measurements: Height: 5\' 2"  (157.5 cm) Weight: 136 lb 14.5 oz (62.1 kg) IBW/kg (Calculated) : 50.1   Vital Signs: Temp: 98.2 F (36.8 C) (09/28 1100) Temp Source: Oral (09/28 1100) BP: 108/61 (09/28 1200) Pulse Rate: 50 (09/28 1200)  Labs:  Recent Labs  01/25/17 1930  01/26/17 0253 01/27/17 0333 01/28/17 0330  HGB  --   < > 9.0* 9.5* 9.1*  HCT  --   --  27.9* 29.4* 28.5*  PLT  --   --  374 407* 350  LABPROT  --   --  28.4* 27.6* 33.1*  INR  --   --  2.70 2.59 3.27  CREATININE  --   --  0.82 0.78 0.88  TROPONINI <0.03  --  <0.03  --   --   < > = values in this interval not displayed.  Estimated Creatinine Clearance: 44.2 mL/min (by C-G formula based on SCr of 0.88 mg/dL).  Assessment:  Anticoag: Warfarin for hx afib - INR 2.59>3.27. CBC stable - Now off Amio - Home regimen: 3mg  SuTh, 1.5mg  MTuWFSa, Admit INR 2.76  Goal of Therapy:  INR 2-3 Monitor platelets by anticoagulation protocol: Yes   Plan:  No Coumadin tonight. Daily INR  Alexandria Chen S. Alford Highland, PharmD, BCPS Clinical Staff Pharmacist Pager [phone]  Eilene Ghazi Stillinger 01/28/2017,1:50 PM

## 2017-01-28 NOTE — Progress Notes (Addendum)
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 She is completed 8 days of vancomycin and cefepime. She reports that she feels better. She has shortness of breath and dyspnea on exertion and dizziness when she stands up.   S:  - worsening hypoxemia. 15L [place]. Last night needed bipap. Daughters at bedside   O: alert and oritend On 15L [place]  HRCT  - visualized. Worse  Ct Chest High Resolution  Result Date: 01/28/2017 CLINICAL DATA:  82 year old female with history of shortness of breath. History of squamous cell carcinoma of the right upper lobe status post chemotherapy and radiation therapy. EXAM: CT CHEST WITHOUT CONTRAST TECHNIQUE: Multidetector CT imaging of the chest was performed following the standard protocol without intravenous contrast. High resolution imaging of the lungs, as well as inspiratory and expiratory imaging, was performed. COMPARISON:  Chest CTA 01/12/2017. FINDINGS: Cardiovascular: Heart size is mildly enlarged. There is no significant pericardial fluid, thickening or pericardial calcification. There is aortic atherosclerosis, as well as atherosclerosis of the great vessels of the mediastinum and the coronary arteries, including calcified atherosclerotic plaque in the left main, left anterior descending, left circumflex and right coronary arteries. Right internal jugular single-lumen porta cath with tip terminating at the superior cavoatrial junction. Mild dilatation of the pulmonic trunk (3.6 cm in diameter). Mediastinum/Nodes: No pathologically enlarged mediastinal or hilar lymph nodes. Please note that accurate exclusion of hilar adenopathy is limited on noncontrast CT scans. Moderate-sized  hiatal hernia. No axillary lymphadenopathy. Lungs/Pleura: Compared to the prior examination the extensive patchy multifocal asymmetrically distributed interstitial and airspace disease in the lungs bilaterally has significantly increased. Now most regions of both lungs are involved, with exception of relative sparing of the basal segments of the left lower lobe, inferior segment of the lingula, and medial basal segment of the right lower lobe. Treated right upper lobe mass again noted measuring approximately 3.0 x 1.9 cm on today's examination (axial image 38 of series 5). In the lung parenchyma adjacent to the treated mass there are developing areas of traction bronchiectasis and more extensive focal septal thickening and thickening of the peribronchovascular interstitium. No pleural effusions. Inspiratory and expiratory imaging is unremarkable. Mild diffuse bronchial wall thickening with mild centrilobular and paraseptal emphysema. Upper Abdomen: Aortic atherosclerosis. 1.2 cm intermediate attenuation right adrenal nodule is stable compared to prior examinations Musculoskeletal: There are no aggressive appearing lytic or blastic lesions noted in the visualized portions of the skeleton. IMPRESSION: 1. Marked worsening patchy multifocal interstitial and airspace disease asymmetrically distributed throughout the lungs bilaterally. Given the patient's history of recent radiation therapy for treatment of known right upper lobe mass (which appears roughly stable in size compared to the prior examination), much of these findings likely reflect postradiation changes of severe radiation pneumonitis and developing areas of radiation fibrosis. The possibility of superimposed multilobar pneumonia is not entirely excluded, but is not strongly favored. 2. Dilatation of the pulmonic trunk (3.6 cm in diameter), concerning for developing pulmonary arterial hypertension. 3. Mild diffuse bronchial wall thickening with mild  centrilobular and paraseptal emphysema; imaging findings suggestive of underlying COPD. 4. Aortic atherosclerosis, in addition to left main and 3 vessel coronary artery disease. 5. Moderate-sized hiatal hernia. 6. Additional incidental findings, as above. Aortic  Atherosclerosis (ICD10-I70.0) and Emphysema (ICD10-J43.9). Electronically Signed   By: Vinnie Langton M.D.   On: 01/28/2017 08:42   Dg Abd Portable 1v  Result Date: 01/28/2017 CLINICAL DATA:  Abdominal distension EXAM: PORTABLE ABDOMEN - 1 VIEW COMPARISON:  12/14/2016 FINDINGS: Scattered large and small bowel gas is noted. No obstructive changes are seen. Calcified uterine fibroids are noted. Mild retained fecal material is noted in the right colon. No free air is seen. No acute bony abnormality is noted. IMPRESSION: No acute abnormality noted. Electronically Signed   By: Inez Catalina M.D.   On: 01/28/2017 10:22     A: Acute on chronic hypoxemic resp failure =- amio tox/XRT tox  With possible lymphangitics  -steadily worse  - not respnding to steroids  P: continue o2 Increase steroids as last ditch Goals of care - d/w Family - told daughters that we are at wits end with Rx of this. Steroids should have helped but have not opening possibilut of lymphangitis. Bx too risky. They are agreeable to higher dose ssteroids as last ditch. PRepared them for concurrent palliation and addressing goals and getting her home on steroids (her goal to be at home). They want to think and also d/w primary MD  Steroid taper over 6 -12 weeks  OPD pulm if hospice not ends up being goal - pccm appt to be made - pls call [phone]  Palliative appropriate  Propgnosis days or weeks  D/w Dr Alfredia Ferguson  PCCM will sign off  > 50% of this > 35 min visit spent in face to face counseling or coordination of care       Dr. Brand Males, M.D., [facility].C.P Pulmonary and Critical Care Medicine Staff Physician [facility] Pager: [phone], If no answer or between  15:00h - 7:00h: call [phone]  01/28/2017 11:31 AM

## 2017-01-28 NOTE — Progress Notes (Signed)
PROGRESS NOTE    Alexandria KEEVEN  YHC:[id number] DOB: 1934-10-26 DOA: 01/05/2017 PCP: [name], FNP  Brief Narrative: Alexandria Chen  Is a 82 year old woman admitted from home on 9/14 due being short of breath. She has history of atrial fibrillation, anxiety, depression hypertension and lung cancer recent finishing chemotherapy and radiation. She was recently treated for pneumonia back in August to [facility] on 9/14 with difficulty breathing and weakness. Patient was found to be hypoxic and was brought into the hospital for evaluation.She was started on antibiotics as there was concern of healthcare associated pneumonia, also was started on Lasix as there also was concern of acute diastolic dysfunction. Patient did not show signs of improvement and had increasing oxygen requirement up to 100% non-rebreather mask. Patient subsequently was transferred to [facility] given the lack of pulmonary service at [facility] and she was admitted to the SDU.   Patient was continued on oxygen supplementation and was started on steroids subsequently transferred to [facility] for further management. She remains dyspenic and desaturates on minimal exertion and it is felt that it is related to Amiodarone Toxicity vs. Radiation Pneumonitis and that it may take several weeks for her to clinically improve. Pulmonary ordered a HRCT and looked worse. Last night patient had Dyspnea and was placed on BiPAP then NRB and transitioned to 15 Liters of HFNC. Pulmonary increased Steroids to IV 125 q6h as patient has not progressed and PCCM feels Palliative Care should be consulted to address EOL Care and Goals of Care.    Assessment & Plan:   Principal Problem:   Hypoxia Active Problems:   Depression   Essential hypertension, benign   Hyperlipidemia   Atrial fibrillation (HCC) [I48.91]   HCAP (healthcare-associated pneumonia)   Bronchospasm   Acute respiratory failure (HCC)   Hyponatremia    Hyperkalemia  Acute respiratory failure with hypoxia due in the setting of ILD and Squamous Cell Lung Cancer with component of radiation pneumonitis versus Amiodarone Toxicity -Failure to improve, worsening over a week course -Having episodes of syncope due to hypoxia  -Continued Steroids at 60 mg q12h and weaned to 40 mg IV q12h per Pulmonary however now increased to 125 mg IV q6h per PCCM -Lasix D/C'd by Cardiology Dr. Einar Gip -Completed course of antibiotic due to concerns of pneumonia with Vancomycin and Cefepime  -C/w Levalbuterol 1.25 mg / Ipratropium 0.5 mg Neb TID -PCCM recommendations appreciated; Recommend to continue to Hold Amiodarone and Taper Steroids to 40 mg q12h and remain at this dose until clinical improvement -Per PCCM may take a few weeks to improve and recommend continuing Rehabilitation Efforts -Pulmonary ordered HRCT and showed marked worsening of patchy multifocal interstitial airspace disease asymmetrically distributed throughout the lungs bilaterally with findings reflective of postradiation changes of sever radiation pneumonitis and radiation fibrosis -C/w Flutter Valve and Incentive Spirometry, Supportive Care with Guaifenesin 600 mg po BID -C/w PT to Assist with Mobilization -CXR 01/25/17 showed PowerPort catheter in stable position. Stable right lung mass. Stable cardiomegaly. Persistent but improved bilateral pulmonary interstitial infiltrates and or edema . A component these changes may be related radiation treatment. -Discussed with Patient's Primary Radiation Oncologist Dr. Quitman Livings and he feels that there is a component of Radiation Pneumonitis and recommended Steroids but patient has not been responsive to Steroids  -Weaned down to 5 Liters High Flow yesterday but decompensated last night and needed BiPAP; Was transitioned from BiPAP to NRB and then to 15 Liters HFNC which she remains  on currently -OOB with Assistance  -Will consult Palliative Care Medicine for  Goals of Care discussion   Syncope due to Hypoxia with Exertion  -See Above -Continue to Mobilize and OOB with Assistance   Chronic diastolic HF  -Weight was up slightly - Lasix increased by Pulmonary Team but discontinued by Cardiology -Continue to monitor and repeat CMP -Strict I's/O's, Daily Weights; -Patient is -8.4835 Liters and Weight is the same since 9/25 -Continue to Monitor Volume Status   A. Fib with RVR, improved and now Bradycardiac -Patient was on amiodarone but due to possible toxicity this was discontinued -CHADS2-VASc of 4 -Was given 1 dose of metoprolol 5 mg IV -Cardizem 90 mg twice a day; Cardiology added Metoprolol 25 mg po TID and then changing it 25 mg po BID -Cardiology consulted - Dr Einar Gip and appreciated Recc's -C/w Metoprolol and Cardizem as above; Held Metoprolol dose this AM as patient was Bradycardic  -C/w Anticoagulation with Warfarin per Pharmacy Dosing    Hypertension -Stable and BP controlled  -C/w Hydralazine 10 mg IV q8hprn for SBP >180 or DBP >100  Chronic Pain Syndrome -Continue Gabapentin 100 mg po qHS  Depression with Anxiety -No suicidal or homicidal ideation  -Continue Duloxetine 60 mg po Daily and Buspirone 15 mg po BID   Leukocytosis -Likely Steroid Demargination vs. Reactive from Lung Cancer -Patient is s/p Treatment with Abx -WBC went from 11.8 -> 12.9 -> 14.7 -> 15.2 -> 16.4 -> 17.3 -Continue to Monitor for S/Sx of Infection -Repeat CBC in AM   Hyponatremia -Mild at 132; Patient's Diuresis stopped 01/25/17 per Cards -Continue to Monitor and repeat CMP in AM   Abdominal Distention -Likely from BiPAP -Ordered KUB and showed Scattered large and small bowel gas is noted. No obstructive changes are seen. Calcified uterine fibroids are noted. Mild retained fecal material is noted in the right colon. No free air is seen. No acute bony abnormality is noted. -Continue to Monitor   Hyperkalemia -Mild at 5.2 -Stopped K+  Supplementation -Continue to Monitor and Repeat CMP in AM   DVT prophylaxis: Anticoagulated with Warfarin Code Status: FULL CODE Family Communication: Discussed with Daughters at bedside Disposition Plan: Remain Inpatient at this time but Home Health PT at D/C with possible Hospice; Remain in SDU at this time  Consultants:   PCCM  Cardiology  Palliative Care Medicine   Procedures: None   Antimicrobials:  Anti-infectives    Start     Dose/Rate Route Frequency Ordered Stop   01/20/17 1000  vancomycin (VANCOCIN) IVPB 750 mg/150 ml premix  Status:  Discontinued     750 mg 150 mL/hr over 60 Minutes Intravenous Every 12 hours 01/20/17 0925 01/22/17 1249   01/17/17 1800  vancomycin (VANCOCIN) 500 mg in sodium chloride 0.9 % 100 mL IVPB  Status:  Discontinued     500 mg 100 mL/hr over 60 Minutes Intravenous Every 12 hours 01/17/17 0934 01/20/17 0925   01/17/17 0700  vancomycin (VANCOCIN) IVPB 1000 mg/200 mL premix     1,000 mg 200 mL/hr over 60 Minutes Intravenous  Once 01/17/17 0650 01/17/17 0800   01/16/17 0000  cefpodoxime (VANTIN) 200 MG tablet     200 mg Oral 2 times daily 01/16/17 0841 01/23/17 2359   01/15/17 0600  ceFEPIme (MAXIPIME) 1 g in dextrose 5 % 50 mL IVPB  Status:  Discontinued     1 g 100 mL/hr over 30 Minutes Intravenous Every 24 hours 01/08/2017 0819 01/22/17 1249   01/06/2017 0530  cefTRIAXone (  ROCEPHIN) 1 g in dextrose 5 % 50 mL IVPB  Status:  Discontinued     1 g 100 mL/hr over 30 Minutes Intravenous  Once 01/19/2017 0518 01/02/2017 0526   01/06/2017 0530  ceFEPIme (MAXIPIME) 2 g in dextrose 5 % 50 mL IVPB     2 g 100 mL/hr over 30 Minutes Intravenous  Once 01/27/2017 0526 01/19/2017 9292     Subjective: Seen and examined and was feeling SOB and daughters felt their mother's abdomen was more distended. No CP. States she had no lightheadedness or dizziness. No other concerns or complaints at this time.   Objective: Vitals:   01/28/17 1100 01/28/17 1200 01/28/17 1426  01/28/17 1515  BP: (!) 107/55 108/61  (!) 106/58  Pulse: (!) 58 (!) 50  62  Resp: (!) 24 (!) 21  (!) 28  Temp: 98.2 F (36.8 C)   97.8 F (36.6 C)  TempSrc: Oral   Axillary  SpO2: 95% 98% 98% 92%  Weight:      Height:        Intake/Output Summary (Last 24 hours) at 01/28/17 1649 Last data filed at 01/28/17 1520  Gross per 24 hour  Intake                0 ml  Output              900 ml  Net             -900 ml   Filed Weights   01/26/17 0300 01/27/17 0410 01/28/17 0357  Weight: 60.6 kg (133 lb 9.6 oz) 59.8 kg (131 lb 13.4 oz) 62.1 kg (136 lb 14.5 oz)   Examination: Physical Exam:  Constitutional: Frail elderly AAF in mild respiratory distress Eyes: Sclerae anicteric. Lids normal ENMT: External ears and nose appear normal. Grossly normal hearing. MMM Neck: Supple with no JVD Respiratory: Diminished with coarse breath sounds with mild crackles. No appreciable wheezing. Patient is tachypenic and has slight increased WOB. Wearing 15 Liters of HFNC Cardiovascular: Bradycardic rate but regular rhythm. No extremity edema Abdomen: Soft, NT, Distended some and is hypertympanic to percuss. Bowel sounds present GU: Deferred Musculoskeletal: No contractures; No cyanosis Skin: Warm and dry. No rashes or lesions Neurologic: CN 2-12 grossly intact. No appreciable focal deficits Psychiatric: Pleasant mood and affect. Intact judgement and insight  Data Reviewed: I have personally reviewed following labs and imaging studies  CBC:  Recent Labs Lab 01/23/17 0612 01/25/17 0442 01/26/17 0253 01/27/17 0333 01/28/17 0330  WBC 12.9* 14.7* 15.2* 16.4* 17.3*  NEUTROABS  --   --   --  15.2* 16.0*  HGB 8.7* 8.7* 9.0* 9.5* 9.1*  HCT 26.2* 27.5* 27.9* 29.4* 28.5*  MCV 89.4 89.0 88.3 88.3 88.0  PLT 419* 401* 374 407* 446   Basic Metabolic Panel:  Recent Labs Lab 01/24/17 0746 01/25/17 0442 01/26/17 0253 01/27/17 0333 01/28/17 0330  NA 136 133* 133* 133* 132*  K 4.6 4.4 4.5 4.9 5.2*   CL 100* 95* 97* 98* 100*  CO2 31 28 27 28 25   GLUCOSE 135* 172* 142* 150* 160*  BUN 25* 27* 26* 27* 31*  CREATININE 0.78 0.86 0.82 0.78 0.88  CALCIUM 8.9 8.8* 8.8* 9.0 8.8*  MG  --  2.3  --  2.3 2.5*  PHOS  --  3.4  --  3.2 3.5   GFR: Estimated Creatinine Clearance: 44.2 mL/min (by C-G formula based on SCr of 0.88 mg/dL). Liver Function Tests:  Recent Labs Lab 01/27/17  6378 01/28/17 0330  AST 24 20  ALT 52 41  ALKPHOS 78 80  BILITOT 0.4 0.7  PROT 6.1* 5.6*  ALBUMIN 2.1* 2.1*   No results for input(s): LIPASE, AMYLASE in the last 168 hours. No results for input(s): AMMONIA in the last 168 hours. Coagulation Profile:  Recent Labs Lab 01/24/17 0746 01/25/17 0442 01/26/17 0253 01/27/17 0333 01/28/17 0330  INR 2.83 2.74 2.70 2.59 3.27   Cardiac Enzymes:  Recent Labs Lab 01/25/17 1930 01/26/17 0253  TROPONINI <0.03 <0.03   BNP (last 3 results) No results for input(s): PROBNP in the last 8760 hours. HbA1C:  Recent Labs  01/26/17 0253  HGBA1C 5.8*   CBG: No results for input(s): GLUCAP in the last 168 hours. Lipid Profile: No results for input(s): CHOL, HDL, LDLCALC, TRIG, CHOLHDL, LDLDIRECT in the last 72 hours. Thyroid Function Tests: No results for input(s): TSH, T4TOTAL, FREET4, T3FREE, THYROIDAB in the last 72 hours. Anemia Panel: No results for input(s): VITAMINB12, FOLATE, FERRITIN, TIBC, IRON, RETICCTPCT in the last 72 hours. Sepsis Labs: No results for input(s): PROCALCITON, LATICACIDVEN in the last 168 hours.  Recent Results (from the past 240 hour(s))  MRSA PCR Screening     Status: None   Collection Time: 01/23/17  2:24 PM  Result Value Ref Range Status   MRSA by PCR NEGATIVE NEGATIVE Final    Comment:        The GeneXpert MRSA Assay (FDA approved for NASAL specimens only), is one component of a comprehensive MRSA colonization surveillance program. It is not intended to diagnose MRSA infection nor to guide or monitor treatment  for MRSA infections.     Radiology Studies: Ct Chest High Resolution  Result Date: 01/28/2017 CLINICAL DATA:  82 year old female with history of shortness of breath. History of squamous cell carcinoma of the right upper lobe status post chemotherapy and radiation therapy. EXAM: CT CHEST WITHOUT CONTRAST TECHNIQUE: Multidetector CT imaging of the chest was performed following the standard protocol without intravenous contrast. High resolution imaging of the lungs, as well as inspiratory and expiratory imaging, was performed. COMPARISON:  Chest CTA 01/12/2017. FINDINGS: Cardiovascular: Heart size is mildly enlarged. There is no significant pericardial fluid, thickening or pericardial calcification. There is aortic atherosclerosis, as well as atherosclerosis of the great vessels of the mediastinum and the coronary arteries, including calcified atherosclerotic plaque in the left main, left anterior descending, left circumflex and right coronary arteries. Right internal jugular single-lumen porta cath with tip terminating at the superior cavoatrial junction. Mild dilatation of the pulmonic trunk (3.6 cm in diameter). Mediastinum/Nodes: No pathologically enlarged mediastinal or hilar lymph nodes. Please note that accurate exclusion of hilar adenopathy is limited on noncontrast CT scans. Moderate-sized hiatal hernia. No axillary lymphadenopathy. Lungs/Pleura: Compared to the prior examination the extensive patchy multifocal asymmetrically distributed interstitial and airspace disease in the lungs bilaterally has significantly increased. Now most regions of both lungs are involved, with exception of relative sparing of the basal segments of the left lower lobe, inferior segment of the lingula, and medial basal segment of the right lower lobe. Treated right upper lobe mass again noted measuring approximately 3.0 x 1.9 cm on today's examination (axial image 38 of series 5). In the lung parenchyma adjacent to the  treated mass there are developing areas of traction bronchiectasis and more extensive focal septal thickening and thickening of the peribronchovascular interstitium. No pleural effusions. Inspiratory and expiratory imaging is unremarkable. Mild diffuse bronchial wall thickening with mild centrilobular and paraseptal emphysema. Upper  Abdomen: Aortic atherosclerosis. 1.2 cm intermediate attenuation right adrenal nodule is stable compared to prior examinations Musculoskeletal: There are no aggressive appearing lytic or blastic lesions noted in the visualized portions of the skeleton. IMPRESSION: 1. Marked worsening patchy multifocal interstitial and airspace disease asymmetrically distributed throughout the lungs bilaterally. Given the patient's history of recent radiation therapy for treatment of known right upper lobe mass (which appears roughly stable in size compared to the prior examination), much of these findings likely reflect postradiation changes of severe radiation pneumonitis and developing areas of radiation fibrosis. The possibility of superimposed multilobar pneumonia is not entirely excluded, but is not strongly favored. 2. Dilatation of the pulmonic trunk (3.6 cm in diameter), concerning for developing pulmonary arterial hypertension. 3. Mild diffuse bronchial wall thickening with mild centrilobular and paraseptal emphysema; imaging findings suggestive of underlying COPD. 4. Aortic atherosclerosis, in addition to left main and 3 vessel coronary artery disease. 5. Moderate-sized hiatal hernia. 6. Additional incidental findings, as above. Aortic Atherosclerosis (ICD10-I70.0) and Emphysema (ICD10-J43.9). Electronically Signed   By: Vinnie Langton M.D.   On: 01/28/2017 08:42   Dg Abd Portable 1v  Result Date: 01/28/2017 CLINICAL DATA:  Abdominal distension EXAM: PORTABLE ABDOMEN - 1 VIEW COMPARISON:  12/14/2016 FINDINGS: Scattered large and small bowel gas is noted. No obstructive changes are seen.  Calcified uterine fibroids are noted. Mild retained fecal material is noted in the right colon. No free air is seen. No acute bony abnormality is noted. IMPRESSION: No acute abnormality noted. Electronically Signed   By: Inez Catalina M.D.   On: 01/28/2017 10:22   Scheduled Meds: . busPIRone  15 mg Oral BID  . diltiazem  90 mg Oral Q12H  . DULoxetine  60 mg Oral Daily  . gabapentin  100 mg Oral QHS  . guaiFENesin  600 mg Oral BID  . ipratropium  0.5 mg Nebulization TID  . levalbuterol  1.25 mg Nebulization TID  . magic mouthwash  5 mL Oral TID PC & HS  . methylPREDNISolone (SOLU-MEDROL) injection  125 mg Intravenous Q6H  . metoprolol tartrate  25 mg Oral BID  . pantoprazole  40 mg Oral BID AC  . sodium chloride flush  10-40 mL Intracatheter Q12H  . Warfarin - Pharmacist Dosing Inpatient   Does not apply q1800   Continuous Infusions:   LOS: 12 days   Kerney Elbe, DO [facility] Pager [phone]  If 7PM-7AM, please contact night-coverage [email or web address] Password [facility] 01/28/2017, 4:49 PM

## 2017-01-29 DIAGNOSIS — Z515 Encounter for palliative care: Secondary | ICD-10-CM

## 2017-01-29 DIAGNOSIS — J700 Acute pulmonary manifestations due to radiation: Secondary | ICD-10-CM

## 2017-01-29 DIAGNOSIS — J984 Other disorders of lung: Secondary | ICD-10-CM

## 2017-01-29 DIAGNOSIS — T462X5A Adverse effect of other antidysrhythmic drugs, initial encounter: Secondary | ICD-10-CM

## 2017-01-29 LAB — MAGNESIUM: MAGNESIUM: 2.3 mg/dL (ref 1.7–2.4)

## 2017-01-29 LAB — PROTIME-INR
INR: 3.65
Prothrombin Time: 36 seconds — ABNORMAL HIGH (ref 11.4–15.2)

## 2017-01-29 LAB — CBC WITH DIFFERENTIAL/PLATELET
BASOS ABS: 0 10*3/uL (ref 0.0–0.1)
Basophils Relative: 0 %
EOS ABS: 0 10*3/uL (ref 0.0–0.7)
EOS PCT: 0 %
HCT: 28.7 % — ABNORMAL LOW (ref 36.0–46.0)
HEMOGLOBIN: 9.2 g/dL — AB (ref 12.0–15.0)
LYMPHS ABS: 0.3 10*3/uL — AB (ref 0.7–4.0)
LYMPHS PCT: 2 %
MCH: 28.2 pg (ref 26.0–34.0)
MCHC: 32.1 g/dL (ref 30.0–36.0)
MCV: 88 fL (ref 78.0–100.0)
Monocytes Absolute: 0.7 10*3/uL (ref 0.1–1.0)
Monocytes Relative: 4 %
NEUTROS PCT: 94 %
Neutro Abs: 14.6 10*3/uL — ABNORMAL HIGH (ref 1.7–7.7)
PLATELETS: 338 10*3/uL (ref 150–400)
RBC: 3.26 MIL/uL — AB (ref 3.87–5.11)
RDW: 16.6 % — ABNORMAL HIGH (ref 11.5–15.5)
WBC: 15.5 10*3/uL — AB (ref 4.0–10.5)

## 2017-01-29 LAB — COMPREHENSIVE METABOLIC PANEL
ALK PHOS: 80 U/L (ref 38–126)
ALT: 36 U/L (ref 14–54)
AST: 20 U/L (ref 15–41)
Albumin: 2 g/dL — ABNORMAL LOW (ref 3.5–5.0)
Anion gap: 6 (ref 5–15)
BUN: 26 mg/dL — AB (ref 6–20)
CALCIUM: 8.7 mg/dL — AB (ref 8.9–10.3)
CHLORIDE: 99 mmol/L — AB (ref 101–111)
CO2: 26 mmol/L (ref 22–32)
CREATININE: 0.81 mg/dL (ref 0.44–1.00)
GFR calc Af Amer: >60 mL/min (ref >60)
GFR calc non Af Amer: >60 mL/min (ref >60)
GLUCOSE: 206 mg/dL — AB (ref 65–99)
Potassium: 4.8 mmol/L (ref 3.5–5.1)
SODIUM: 131 mmol/L — AB (ref 135–145)
Total Bilirubin: 0.6 mg/dL (ref 0.3–1.2)
Total Protein: 5.5 g/dL — ABNORMAL LOW (ref 6.5–8.1)

## 2017-01-29 LAB — PHOSPHORUS: PHOSPHORUS: 3 mg/dL (ref 2.5–4.6)

## 2017-01-29 NOTE — Progress Notes (Signed)
ANTICOAGULATION CONSULT NOTE - Follow Up Consult  Pharmacy Consult for Coumadin Indication: atrial fibrillation  Allergies  Allergen Reactions  . Amiodarone     Discontinued 01/23/17 for possible pulmonary toxicity    Patient Measurements: Height: 5\' 2"  (157.5 cm) Weight: 129 lb 13.6 oz (58.9 kg) IBW/kg (Calculated) : 50.1   Vital Signs: Temp: 97.4 F (36.3 C) (09/29 0815) Temp Source: Axillary (09/29 0815) BP: 141/69 (09/29 0815) Pulse Rate: 66 (09/29 0815)  Labs:  Recent Labs  01/27/17 0333 01/28/17 0330 01/29/17 0302  HGB 9.5* 9.1* 9.2*  HCT 29.4* 28.5* 28.7*  PLT 407* 350 338  LABPROT 27.6* 33.1* 36.0*  INR 2.59 3.27 3.65  CREATININE 0.78 0.88 0.81    Estimated Creatinine Clearance: 43.8 mL/min (by C-G formula based on SCr of 0.81 mg/dL).  Assessment:  Anticoag: Warfarin for hx afib - INR 2.59>3.27>3.6. CBC stable. No bleeding noted. - Now off Amio - Home regimen: 3mg  SuTh, 1.5mg  [id number], Admit INR 2.76  Goal of Therapy:  INR 2-3 Monitor platelets by anticoagulation protocol: Yes   Plan:  No Coumadin tonight. Daily INR  Erin Hearing PharmD., BCPS Clinical Pharmacist Pager [phone] 01/29/2017 9:05 AM

## 2017-01-29 NOTE — Progress Notes (Signed)
PROGRESS NOTE    Alexandria Chen  BDZ:[id number] DOB: 10/27/1934 DOA: 01/28/2017 PCP: [name], FNP  Brief Narrative: Alexandria Chen  Is a 82 year old woman admitted from home on 9/14 due being short of breath. She has history of atrial fibrillation, anxiety, depression hypertension and lung cancer recent finishing chemotherapy and radiation. She was recently treated for pneumonia back in August to  [facility] on 9/14 with difficulty breathing and weakness. Patient was found to be hypoxic and was brought into the hospital for evaluation.She was started on antibiotics as there was concern of healthcare associated pneumonia, also was started on Lasix as there also was concern of acute diastolic dysfunction. Patient did not show signs of improvement and had increasing oxygen requirement up to 100% non-rebreather mask. Patient subsequently was transferred to [facility] given the lack of pulmonary service at [facility] and she was admitted to the SDU.   Patient was continued on oxygen supplementation and was started on steroids subsequently transferred to [facility] for further management. She remains dyspenic and desaturates on minimal exertion and it is felt that it is related to Amiodarone Toxicity vs. Radiation Pneumonitis and that it may take several weeks for her to clinically improve. Pulmonary ordered a HRCT and looked worse. On the night of 9/27 patient had Dyspnea and was placed on BiPAP then NRB and transitioned to 15 Liters of HFNC. Pulmonary increased Steroids to IV 125 q6h as patient has not progressed and PCCM feels Palliative Care should be consulted to address EOL Care and Goals of Care. Patient continues to Desaturate and Palliative Care to evaluate and Family meeting today.  Assessment & Plan:   Principal Problem:   Hypoxia Active Problems:   Depression   Essential hypertension, benign   Hyperlipidemia   Atrial fibrillation (HCC) [I48.91]   HCAP (healthcare-associated  pneumonia)   Bronchospasm   Acute respiratory failure (HCC)   Hyponatremia   Hyperkalemia  Acute respiratory failure with hypoxia due in the setting of ILD and Squamous Cell Lung Cancer with component of radiation pneumonitis versus Amiodarone Toxicity -Failure to improve, worsening over 2 week course -Having episodes of syncope due to hypoxia  -PCCM started IV Steroids now 125 mg IV q6h and will continue  -Lasix D/C'd by Cardiology Dr. Einar Gip -Completed course of antibiotic due to concerns of pneumonia with Vancomycin and Cefepime  -C/w Levalbuterol 1.25 mg / Ipratropium 0.5 mg Neb TID -PCCM recommendations appreciated; Recommend to continue to Hold Amiodarone -Pulmonary ordered HRCT and showed marked worsening of patchy multifocal interstitial airspace disease asymmetrically distributed throughout the lungs bilaterally with findings reflective of postradiation changes of sever radiation pneumonitis and radiation fibrosis -C/w Flutter Valve and Incentive Spirometry, Supportive Care with Guaifenesin 600 mg po BID -C/w PT to Assist with Mobilization -CXR 01/25/17 showed PowerPort catheter in stable position. Stable right lung mass. Stable cardiomegaly. Persistent but improved bilateral pulmonary interstitial infiltrates and or edema . A component these changes may be related radiation treatment. -Discussed with Patient's Primary Radiation Oncologist Dr. Quitman Livings and he feels that there is a component of Radiation Pneumonitis and recommended Steroids but patient has not been responsive to Steroids  -Continues to Have significant amount of O2 -OOB with Assistance  -Will consult Palliative Care Medicine for Goals of Care discussion and Family meeting either today  Syncope due to Hypoxia with Exertion  -See Above -Continue to Mobilize and OOB with Assistance   Chronic diastolic HF  -Weight was up slightly - Lasix increased by  Pulmonary Team but discontinued by Cardiology -Continue to  monitor and repeat CMP -Strict I's/O's, Daily Weights; -Patient is -8.7473 Liters and Weight is now 129 lbs  -Continue to Monitor Volume Status   A. Fib with RVR, improved and now Bradycardiac Rate -Patient was on amiodarone but due to possible toxicity this was discontinued -CHADS2-VASc of 4 -Cardizem 90 mg twice a day and Metoprolol 25 mg po BID -Cardiology consulted - Dr Einar Gip and appreciated Recc's -C/w Anticoagulation with Warfarin per Pharmacy Dosing    Hypertension -Stable and BP controlled  -C/w Hydralazine 10 mg IV q8hprn for SBP >180 or DBP >100  Chronic Pain Syndrome -Continue Gabapentin 100 mg po qHS  Depression with Anxiety -No suicidal or homicidal ideation  -Continue Duloxetine 60 mg po Daily and Buspirone 15 mg po BID   Leukocytosis -Likely Steroid Demargination vs. Reactive from Lung Cancer -Patient is s/p Treatment with Abx -WBC went from 11.8 -> 12.9 -> 14.7 -> 15.2 -> 16.4 -> 17.3 -> 15.5 -Continue to Monitor for S/Sx of Infection -Repeat CBC in AM   Hyponatremia -Mild at 131; Patient's Diuresis stopped 01/25/17 per Cards -Continue to Monitor and repeat CMP in AM   Abdominal Distention, slightly improved -Likely from BiPAP -Ordered KUB and showed Scattered large and small bowel gas is noted. No obstructive changes are seen. Calcified uterine fibroids are noted. Mild retained fecal material is noted in the right colon. No free air is seen. No acute bony abnormality is noted. -Continue to Monitor   Hyperkalemia, improved -Was mild at 5.2 and then improved to 4.8  -Stopped K+ Supplementation -Continue to Monitor and Repeat CMP in AM   DVT prophylaxis: Anticoagulated with Warfarin Code Status: FULL CODE Family Communication: Discussed with Daughters and family at bedside  Disposition Plan: Remain Inpatient at this time but [facility] at D/C with possible Hospice; Remain in SDU at this time  Consultants:   PCCM  Cardiology  Palliative  Care Medicine   Procedures: None   Antimicrobials:  Anti-infectives    Start     Dose/Rate Route Frequency Ordered Stop   01/20/17 1000  vancomycin (VANCOCIN) IVPB 750 mg/150 ml premix  Status:  Discontinued     750 mg 150 mL/hr over 60 Minutes Intravenous Every 12 hours 01/20/17 0925 01/22/17 1249   01/17/17 1800  vancomycin (VANCOCIN) 500 mg in sodium chloride 0.9 % 100 mL IVPB  Status:  Discontinued     500 mg 100 mL/hr over 60 Minutes Intravenous Every 12 hours 01/17/17 0934 01/20/17 0925   01/17/17 0700  vancomycin (VANCOCIN) IVPB 1000 mg/200 mL premix     1,000 mg 200 mL/hr over 60 Minutes Intravenous  Once 01/17/17 0650 01/17/17 0800   01/16/17 0000  cefpodoxime (VANTIN) 200 MG tablet     200 mg Oral 2 times daily 01/16/17 0841 01/23/17 2359   01/15/17 0600  ceFEPIme (MAXIPIME) 1 g in dextrose 5 % 50 mL IVPB  Status:  Discontinued     1 g 100 mL/hr over 30 Minutes Intravenous Every 24 hours 01/17/2017 0819 01/22/17 1249   01/07/2017 0530  cefTRIAXone (ROCEPHIN) 1 g in dextrose 5 % 50 mL IVPB  Status:  Discontinued     1 g 100 mL/hr over 30 Minutes Intravenous  Once 01/19/2017 0518 01/23/2017 0526   01/16/2017 0530  ceFEPIme (MAXIPIME) 2 g in dextrose 5 % 50 mL IVPB     2 g 100 mL/hr over 30 Minutes Intravenous  Once 01/01/2017 0526 01/23/2017 1443  Subjective: Seen and examined and stated she still felt as if she had some gas. No CP. Still SOB and mildly tachypenic. Continues to Desaturate with minimal exertion. No other concerns ro  Objective: Vitals:   01/29/17 0600 01/29/17 0802 01/29/17 0815 01/29/17 1200  BP: (!) 137/106  (!) 141/69 (!) 147/72  Pulse: 62  66 62  Resp: (!) 26  (!) 24 19  Temp:   (!) 97.4 F (36.3 C) 98 F (36.7 C)  TempSrc:   Axillary Axillary  SpO2: 92% 91% 92% 96%  Weight:      Height:        Intake/Output Summary (Last 24 hours) at 01/29/17 1249 Last data filed at 01/29/17 0815  Gross per 24 hour  Intake              840 ml  Output               950 ml  Net             -110 ml   Filed Weights   01/27/17 0410 01/28/17 0357 01/29/17 0501  Weight: 59.8 kg (131 lb 13.4 oz) 62.1 kg (136 lb 14.5 oz) 58.9 kg (129 lb 13.6 oz)   Examination: Physical Exam:  Constitutional: Frail elderly AAF in some respiratory distress. Eyes: Sclerae anicteric. Lids normal ENMT: External ears and nose appear normal. Grossly normal hearing. MMM Neck: Supple with no JVD Respiratory: Increased WOB with diminished coarse breath sounds with some scattered crackles. No wheezing noted Cardiovascular: Bradycardic rate, regular rhythm. No extremity edema Abdomen: Soft, NT, Slightly distended and has some tympany to percussion. Bowel sounds present GU: Deferred Musculoskeletal: No contractures; No cyanosis Skin: Warm and dry. No rashes or lesions Neurologic: CN 2-12 grossly intact. No appreciable focal deficits Psychiatric: Anxious mood and affect. Intact judgement and insight  Data Reviewed: I have personally reviewed following labs and imaging studies  CBC:  Recent Labs Lab 01/25/17 0442 01/26/17 0253 01/27/17 0333 01/28/17 0330 01/29/17 0302  WBC 14.7* 15.2* 16.4* 17.3* 15.5*  NEUTROABS  --   --  15.2* 16.0* 14.6*  HGB 8.7* 9.0* 9.5* 9.1* 9.2*  HCT 27.5* 27.9* 29.4* 28.5* 28.7*  MCV 89.0 88.3 88.3 88.0 88.0  PLT 401* 374 407* 350 277   Basic Metabolic Panel:  Recent Labs Lab 01/25/17 0442 01/26/17 0253 01/27/17 0333 01/28/17 0330 01/29/17 0302  NA 133* 133* 133* 132* 131*  K 4.4 4.5 4.9 5.2* 4.8  CL 95* 97* 98* 100* 99*  CO2 28 27 28 25 26   GLUCOSE 172* 142* 150* 160* 206*  BUN 27* 26* 27* 31* 26*  CREATININE 0.86 0.82 0.78 0.88 0.81  CALCIUM 8.8* 8.8* 9.0 8.8* 8.7*  MG 2.3  --  2.3 2.5* 2.3  PHOS 3.4  --  3.2 3.5 3.0   GFR: Estimated Creatinine Clearance: 43.8 mL/min (by C-G formula based on SCr of 0.81 mg/dL). Liver Function Tests:  Recent Labs Lab 01/27/17 0333 01/28/17 0330 01/29/17 0302  AST 24 20 20   ALT 52 41 36    ALKPHOS 78 80 80  BILITOT 0.4 0.7 0.6  PROT 6.1* 5.6* 5.5*  ALBUMIN 2.1* 2.1* 2.0*   No results for input(s): LIPASE, AMYLASE in the last 168 hours. No results for input(s): AMMONIA in the last 168 hours. Coagulation Profile:  Recent Labs Lab 01/25/17 0442 01/26/17 0253 01/27/17 0333 01/28/17 0330 01/29/17 0302  INR 2.74 2.70 2.59 3.27 3.65   Cardiac Enzymes:  Recent Labs Lab 01/25/17 1930  01/26/17 0253  TROPONINI <0.03 <0.03   BNP (last 3 results) No results for input(s): PROBNP in the last 8760 hours. HbA1C: No results for input(s): HGBA1C in the last 72 hours. CBG: No results for input(s): GLUCAP in the last 168 hours. Lipid Profile: No results for input(s): CHOL, HDL, LDLCALC, TRIG, CHOLHDL, LDLDIRECT in the last 72 hours. Thyroid Function Tests: No results for input(s): TSH, T4TOTAL, FREET4, T3FREE, THYROIDAB in the last 72 hours. Anemia Panel: No results for input(s): VITAMINB12, FOLATE, FERRITIN, TIBC, IRON, RETICCTPCT in the last 72 hours. Sepsis Labs: No results for input(s): PROCALCITON, LATICACIDVEN in the last 168 hours.  Recent Results (from the past 240 hour(s))  MRSA PCR Screening     Status: None   Collection Time: 01/23/17  2:24 PM  Result Value Ref Range Status   MRSA by PCR NEGATIVE NEGATIVE Final    Comment:        The GeneXpert MRSA Assay (FDA approved for NASAL specimens only), is one component of a comprehensive MRSA colonization surveillance program. It is not intended to diagnose MRSA infection nor to guide or monitor treatment for MRSA infections.     Radiology Studies: Ct Chest High Resolution  Result Date: 01/28/2017 CLINICAL DATA:  82 year old female with history of shortness of breath. History of squamous cell carcinoma of the right upper lobe status post chemotherapy and radiation therapy. EXAM: CT CHEST WITHOUT CONTRAST TECHNIQUE: Multidetector CT imaging of the chest was performed following the standard protocol without  intravenous contrast. High resolution imaging of the lungs, as well as inspiratory and expiratory imaging, was performed. COMPARISON:  Chest CTA 01/12/2017. FINDINGS: Cardiovascular: Heart size is mildly enlarged. There is no significant pericardial fluid, thickening or pericardial calcification. There is aortic atherosclerosis, as well as atherosclerosis of the great vessels of the mediastinum and the coronary arteries, including calcified atherosclerotic plaque in the left main, left anterior descending, left circumflex and right coronary arteries. Right internal jugular single-lumen porta cath with tip terminating at the superior cavoatrial junction. Mild dilatation of the pulmonic trunk (3.6 cm in diameter). Mediastinum/Nodes: No pathologically enlarged mediastinal or hilar lymph nodes. Please note that accurate exclusion of hilar adenopathy is limited on noncontrast CT scans. Moderate-sized hiatal hernia. No axillary lymphadenopathy. Lungs/Pleura: Compared to the prior examination the extensive patchy multifocal asymmetrically distributed interstitial and airspace disease in the lungs bilaterally has significantly increased. Now most regions of both lungs are involved, with exception of relative sparing of the basal segments of the left lower lobe, inferior segment of the lingula, and medial basal segment of the right lower lobe. Treated right upper lobe mass again noted measuring approximately 3.0 x 1.9 cm on today's examination (axial image 38 of series 5). In the lung parenchyma adjacent to the treated mass there are developing areas of traction bronchiectasis and more extensive focal septal thickening and thickening of the peribronchovascular interstitium. No pleural effusions. Inspiratory and expiratory imaging is unremarkable. Mild diffuse bronchial wall thickening with mild centrilobular and paraseptal emphysema. Upper Abdomen: Aortic atherosclerosis. 1.2 cm intermediate attenuation right adrenal nodule  is stable compared to prior examinations Musculoskeletal: There are no aggressive appearing lytic or blastic lesions noted in the visualized portions of the skeleton. IMPRESSION: 1. Marked worsening patchy multifocal interstitial and airspace disease asymmetrically distributed throughout the lungs bilaterally. Given the patient's history of recent radiation therapy for treatment of known right upper lobe mass (which appears roughly stable in size compared to the prior examination), much of these findings likely reflect postradiation  changes of severe radiation pneumonitis and developing areas of radiation fibrosis. The possibility of superimposed multilobar pneumonia is not entirely excluded, but is not strongly favored. 2. Dilatation of the pulmonic trunk (3.6 cm in diameter), concerning for developing pulmonary arterial hypertension. 3. Mild diffuse bronchial wall thickening with mild centrilobular and paraseptal emphysema; imaging findings suggestive of underlying COPD. 4. Aortic atherosclerosis, in addition to left main and 3 vessel coronary artery disease. 5. Moderate-sized hiatal hernia. 6. Additional incidental findings, as above. Aortic Atherosclerosis (ICD10-I70.0) and Emphysema (ICD10-J43.9). Electronically Signed   By: Vinnie Langton M.D.   On: 01/28/2017 08:42   Dg Abd Portable 1v  Result Date: 01/28/2017 CLINICAL DATA:  Abdominal distension EXAM: PORTABLE ABDOMEN - 1 VIEW COMPARISON:  12/14/2016 FINDINGS: Scattered large and small bowel gas is noted. No obstructive changes are seen. Calcified uterine fibroids are noted. Mild retained fecal material is noted in the right colon. No free air is seen. No acute bony abnormality is noted. IMPRESSION: No acute abnormality noted. Electronically Signed   By: Inez Catalina M.D.   On: 01/28/2017 10:22   Scheduled Meds: . busPIRone  15 mg Oral BID  . diltiazem  90 mg Oral Q12H  . DULoxetine  60 mg Oral Daily  . gabapentin  100 mg Oral QHS  . guaiFENesin   600 mg Oral BID  . ipratropium  0.5 mg Nebulization TID  . levalbuterol  1.25 mg Nebulization TID  . magic mouthwash  5 mL Oral TID PC & HS  . methylPREDNISolone (SOLU-MEDROL) injection  125 mg Intravenous Q6H  . metoprolol tartrate  25 mg Oral BID  . pantoprazole  40 mg Oral BID AC  . sodium chloride flush  10-40 mL Intracatheter Q12H  . Warfarin - Pharmacist Dosing Inpatient   Does not apply q1800   Continuous Infusions:   LOS: 13 days   Kerney Elbe, DO [facility] Pager [phone]  If 7PM-7AM, please contact night-coverage [email or web address] Password [facility] 01/29/2017, 12:49 PM

## 2017-01-29 NOTE — Consult Note (Signed)
Consultation Note Date: 01/29/2017   Patient Name: Alexandria Chen  DOB: 10-12-1934  MRN: [id number]  Age / Sex: 82 y.o., female  PCP: [name], FNP Referring Physician: Kerney Elbe, DO  Reason for Consultation: Establishing goals of care and Psychosocial/spiritual support  HPI/Patient Profile: 82 y.o. female  with past medical history of diastolic heart failure, COPD, hyperlipidemia, atrial fib, depression with anxiety, squamous cell lung cancer status post chemotherapy and radiation treatment (completed in [place] 2018) admitted on 01/13/2017 to [facility]  with shortness of breath. Despite supportive therapy, steroids, antibiotics patient's respiratory status continued to worsen and she was transferred to [facility] for additional critical care medicine/pulmonology support. Repeat chest CT shows markedly worsening interstitial disease; likely postradiation fibrosis/pneumonitis. On the evening of 01/02/17, her respiratory status worsened and she was placed on BiPAP and steroids were increased. Her O2 sats are dropping with any movement. Usual treatment for radiation pneumonitis is usually steroids per pulmonologist's consult, but unfortunately she has not been responding. Per chart review further workup such as biopsy is too risky given patient's status   Clinical Assessment and Goals of Care: Met with patient's large family including daughters Alexandria Chen, sons Alexandria Chen. Reviewed current clinical status specifically, patient's pulmonary status with probable radiation pneumonitis and what to expect going forward. Patient desats with any movement. Family recognizes how acutely ill she is but still feels like she has changed so suddenly. She had just completed her cancer treatment in [place] 2018 and they were operating under the assumption that she  would have more time.  Patient has not designated a healthcare proxy and MiraLA[employer] to her large family. She is widowed but has a significant other. Her legal healthcare proxy decision making would fall to the majority of her children  Also explained and defined CODE STATUS; terms of full code versus DO NOT RESUSCITATE. It is interesting that patient's husband died on life support, intubated and after being coded 3 times. Daughter shares that pt was unable to make the decision to stop treatment and had deferred this decision making to her children    SUMMARY OF RECOMMENDATIONS   Patient remains full code. Of treatment for now Plan is to stop by on 01/30/2017 and speak to patient alone in an attempt to glean how she sees her health going as well as her wishes. This was per family discussion and request. Introduced the topic of hospice, as well as residential hospice Also educated family on the role of opioids to reduce air hunger Code Status/Advance Care Planning:  Full code    Symptom Management:   Dyspnea: Continue with targeted pulmonary treatments, nebulizer treatments, steroids. Educated patient family on the role of opioids but will hold off on ordering these until further goals of care discussion  Palliative Prophylaxis:   Aspiration, Bowel Regimen, Delirium Protocol, Eye Care, Frequent Pain Assessment, Oral Care and Turn Reposition  Additional Recommendations (Limitations, Scope, Preferences):  Full Scope Treatment  Psycho-social/Spiritual:  Desire for further Chaplaincy support:no  Additional Recommendations: Grief/Bereavement Support  Prognosis:   < 2 weeks in the setting of radiation pneumonitis not responding to steroids, worsening dyspnea, right upper lobe squamous cell carcinoma, diastolic heart failure  Discharge Planning: To Be Determined      Primary Diagnoses: Present on Admission: . Essential hypertension, benign . Hyperlipidemia . Atrial fibrillation  ([place]) [I48.91] . Depression . Hypoxia   I have reviewed the medical record, interviewed the patient and family, and examined the patient. The following aspects are pertinent.  Past Medical History:  Diagnosis Date  . A-fib ([place])   . Alcohol abuse, in remission   . Anxiety   . Atrial fibrillation ([place])   . Depression   . Dyspnea    walking distances   . Dysrhythmia   . History of bronchitis   . HTN (hypertension)   . Mass of right lung 07/13/2016  . Osteopenia   . Squamous cell carcinoma of right lung ([place]) 07/13/2016   Social History   Social History  . Marital status: Widowed    Spouse name: N/A  . Number of children: 5  . Years of education: N/A   Occupational History  . Retired    Social History Main Topics  . Smoking status: Former Smoker    Packs/day: 2.00    Years: 60.00    Types: Cigarettes    Start date: 05/03/1956    Quit date: 07/15/2016  . Smokeless tobacco: Never Used  . Alcohol use No     Comment: Patient reports no alcohol use since 05/24/2016.  . Drug use: No  . Sexual activity: No   Other Topics Concern  . None   Social History Narrative  . None   Family History  Problem Relation Age of Onset  . Anxiety disorder Mother   . Dementia Mother   . Hypertension Mother   . Alcohol abuse Father   . Arthritis Father   . Alcohol abuse Brother   . ADD / ADHD Neg Hx   . Drug abuse Neg Hx   . Bipolar disorder Neg Hx   . Depression Neg Hx   . OCD Neg Hx   . Paranoid behavior Neg Hx   . Schizophrenia Neg Hx   . Seizures Neg Hx   . Sexual abuse Neg Hx   . Physical abuse Neg Hx    Scheduled Meds: . busPIRone  15 mg Oral BID  . diltiazem  90 mg Oral Q12H  . DULoxetine  60 mg Oral Daily  . gabapentin  100 mg Oral QHS  . guaiFENesin  600 mg Oral BID  . ipratropium  0.5 mg Nebulization TID  . levalbuterol  1.25 mg Nebulization TID  . magic mouthwash  5 mL Oral TID PC & HS  . methylPREDNISolone (SOLU-MEDROL) injection  125 mg Intravenous Q6H  .  metoprolol tartrate  25 mg Oral BID  . pantoprazole  40 mg Oral BID AC  . sodium chloride flush  10-40 mL Intracatheter Q12H  . Warfarin - Pharmacist Dosing Inpatient   Does not apply q1800   Continuous Infusions: PRN Meds:.albuterol, alum & mag hydroxide-simeth, hydrALAZINE, LORazepam, nitroGLYCERIN, sodium chloride, sodium chloride flush Medications Prior to Admission:  Prior to Admission medications   Medication Sig Start Date End Date Taking? Authorizing Provider  acetaminophen (TYLENOL) 325 MG tablet Take 2 tablets (650 mg total) by mouth every 4 (four) hours as needed for mild pain, moderate pain, fever or headache. 12/06/16  Yes Le,  Collier Salina, MD  amiodarone (PACERONE) 200 MG tablet Take 200 mg by mouth daily.  08/04/16  Yes [provider]  aspirin EC 81 MG tablet Take 81 mg by mouth daily.   Yes [provider]  atorvastatin (LIPITOR) 40 MG tablet TAKE 1 TABLET (40 MG TOTAL) BY MOUTH DAILY. FOR HYPERLIPIDEMIA 05/29/16  Yes Hawks, Christy A, FNP  busPIRone (BUSPAR) 15 MG tablet Take 1 tablet (15 mg total) by mouth 2 (two) times daily. 08/02/16  Yes Cloria Spring, MD  DULoxetine (CYMBALTA) 60 MG capsule Take 1 capsule (60 mg total) by mouth daily. 08/02/16 08/02/17 Yes Cloria Spring, MD  fish oil-omega-3 fatty acids 1000 MG capsule Take 3 capsules (3 g total) by mouth daily. For hyperlipidemia. Patient taking differently: Take 1 g by mouth 3 (three) times daily. For hyperlipidemia. 10/19/11  Yes Mashburn, Marlane Hatcher, PA-C  fluticasone (FLONASE) 50 MCG/ACT nasal spray Place 2 sprays into both nostrils 2 (two) times daily. Use as needed Patient taking differently: Place 2 sprays into both nostrils 2 (two) times daily as needed for allergies. Use as needed 11/05/15  Yes Hawks, Christy A, FNP  gabapentin (NEURONTIN) 100 MG capsule Take 1 capsule (100 mg total) by mouth at bedtime. 08/02/16  Yes Cloria Spring, MD  nitroGLYCERIN (NITROSTAT) 0.4 MG SL tablet Place 0.4 mg under the tongue.  07/13/16  Yes [provider]  omeprazole (PRILOSEC) 40 MG capsule Take 1 capsule (40 mg total) by mouth daily. 11/01/16  Yes Twana First, MD  warfarin (COUMADIN) 3 MG tablet Take 1.5-3 mg by mouth daily at 6 PM. 1.5 mg daily except 3 mg on 'Sundays and Thursdays   Yes [provider]  albuterol (PROVENTIL) (2.5 MG/3ML) 0.083% nebulizer solution Take 3 mLs (2.5 mg total) by nebulization every 2 (two) hours as needed for wheezing or shortness of breath. 01/16/17   Abrol, Nayana, MD  guaiFENesin (MUCINEX) 600 MG 12 hr tablet Take 1 tablet (600 mg total) by mouth 2 (two) times daily. 01/16/17   Abrol, Nayana, MD  HYDROcodone-homatropine (HYCODAN) 5-1.5 MG/5ML syrup Take 5 mLs by mouth every 6 (six) hours as needed for cough. 01/16/17   Abrol, Nayana, MD   Allergies  Allergen Reactions  . Amiodarone     Discontinued 01/23/17 for possible pulmonary toxicity   Review of Systems  Unable to perform ROS: Severe respiratory distress    Physical Exam  Constitutional:  Acutely ill appearing elderly female short of breath at rest  Neck: Normal range of motion.  Pulmonary/Chest:  Increased work of breathing  Neurological: She is alert.  Skin: Skin is warm and dry.  Psychiatric:  Anxious  Nursing note and vitals reviewed.   Vital Signs: BP (!) 147/72 (BP Location: Left Arm)   Pulse 62   Temp 98 F (36.7 C) (Axillary)   Resp 19   Ht 5\' 2" (1.575 m)   Wt 58.9 kg (129 lb 13.6 oz)   SpO2 96%   BMI 23.75 kg/m  Pain Assessment: No/denies pain POSS *See Group Information*: 1-Acceptable,Awake and alert Pain Score: 0-No pain   SpO2: SpO2: 96 % O2 Device:SpO2: 96 % O2 Flow Rate: .O2 Flow Rate (L/min): 15 L/min  IO: Intake/output summary:  Intake/Output Summary (Last 24 hours) at 01/29/17 1332 Last data filed at 01/29/17 0815  Gross per 24 hour  Intake              48' 0 ml  Output  950 ml  Net             -470 ml    LBM: Last BM Date: 01/26/17 Baseline Weight:  Weight: 57.2 kg (126 lb) Most recent weight: Weight: 58.9 kg (129 lb 13.6 oz)     Palliative Assessment/Data:   Flowsheet Rows     Most Recent Value  Intake Tab  Referral Department  Hospitalist  Unit at Time of Referral  Intermediate Care Unit  Palliative Care Primary Diagnosis  Pulmonary  Date Notified  01/28/17  Palliative Care Type  New Palliative care  Reason for referral  Clarify Goals of Care, Psychosocial or Spiritual support  Date of Admission  01/27/2017  Date first seen by Palliative Care  01/29/17  # of days Palliative referral response time  1 Day(s)  # of days IP prior to Palliative referral  14  Clinical Assessment  Palliative Performance Scale Score  40%  Pain Max last 24 hours  Not able to report  Pain Min Last 24 hours  Not able to report  Dyspnea Max Last 24 Hours  Not able to report  Dyspnea Min Last 24 hours  Not able to report  Nausea Max Last 24 Hours  Not able to report  Nausea Min Last 24 Hours  Not able to report  Anxiety Max Last 24 Hours  Not able to report  Anxiety Min Last 24 Hours  Not able to report  Other Max Last 24 Hours  Not able to report  Psychosocial & Spiritual Assessment  Palliative Care Outcomes  Patient/Family meeting held?  Yes  Who was at the meeting?  3 Daughters and son      Time In: 1600 Time Out: 1730 Time Total: 90 min Greater than 50%  of this time was spent counseling and coordinating care related to the above assessment and plan.  Signed by: Dory Horn, NP   Please contact Palliative Medicine Team phone at [phone] for questions and concerns.  For individual provider: See Shea Evans

## 2017-01-30 LAB — CBC WITH DIFFERENTIAL/PLATELET
Basophils Absolute: 0 10*3/uL (ref 0.0–0.1)
Basophils Relative: 0 %
Eosinophils Absolute: 0 10*3/uL (ref 0.0–0.7)
Eosinophils Relative: 0 %
HEMATOCRIT: 29 % — AB (ref 36.0–46.0)
HEMOGLOBIN: 9.3 g/dL — AB (ref 12.0–15.0)
LYMPHS ABS: 0.2 10*3/uL — AB (ref 0.7–4.0)
Lymphocytes Relative: 1 %
MCH: 28.1 pg (ref 26.0–34.0)
MCHC: 32.1 g/dL (ref 30.0–36.0)
MCV: 87.6 fL (ref 78.0–100.0)
MONO ABS: 1 10*3/uL (ref 0.1–1.0)
MONOS PCT: 6 %
NEUTROS PCT: 93 %
Neutro Abs: 15.4 10*3/uL — ABNORMAL HIGH (ref 1.7–7.7)
Platelets: 343 10*3/uL (ref 150–400)
RBC: 3.31 MIL/uL — ABNORMAL LOW (ref 3.87–5.11)
RDW: 16.4 % — AB (ref 11.5–15.5)
WBC: 16.6 10*3/uL — ABNORMAL HIGH (ref 4.0–10.5)

## 2017-01-30 LAB — COMPREHENSIVE METABOLIC PANEL
ALK PHOS: 79 U/L (ref 38–126)
ALT: 35 U/L (ref 14–54)
ANION GAP: 6 (ref 5–15)
AST: 21 U/L (ref 15–41)
Albumin: 2.1 g/dL — ABNORMAL LOW (ref 3.5–5.0)
BILIRUBIN TOTAL: 0.5 mg/dL (ref 0.3–1.2)
BUN: 19 mg/dL (ref 6–20)
CALCIUM: 8.7 mg/dL — AB (ref 8.9–10.3)
CO2: 26 mmol/L (ref 22–32)
Chloride: 99 mmol/L — ABNORMAL LOW (ref 101–111)
Creatinine, Ser: 0.68 mg/dL (ref 0.44–1.00)
Glucose, Bld: 182 mg/dL — ABNORMAL HIGH (ref 65–99)
Potassium: 4.3 mmol/L (ref 3.5–5.1)
Sodium: 131 mmol/L — ABNORMAL LOW (ref 135–145)
TOTAL PROTEIN: 5.4 g/dL — AB (ref 6.5–8.1)

## 2017-01-30 LAB — PROTIME-INR
INR: 3.28
PROTHROMBIN TIME: 33.1 s — AB (ref 11.4–15.2)

## 2017-01-30 LAB — PHOSPHORUS: PHOSPHORUS: 2.9 mg/dL (ref 2.5–4.6)

## 2017-01-30 LAB — MAGNESIUM: MAGNESIUM: 2.2 mg/dL (ref 1.7–2.4)

## 2017-01-30 MED ORDER — METOPROLOL TARTRATE 5 MG/5ML IV SOLN: 2.5000 mg | Freq: Four times a day (QID) | INTRAVENOUS | Status: DC | PRN

## 2017-01-30 MED ORDER — MORPHINE SULFATE (PF) 2 MG/ML IV SOLN: 1.0000 mg | INTRAVENOUS | Status: DC | PRN

## 2017-01-30 MED ORDER — MORPHINE SULFATE (PF) 2 MG/ML IV SOLN
2.0000 mg | INTRAVENOUS | Status: DC | PRN
  Administered 2017-01-30: 2 mg via INTRAVENOUS
  Filled 2017-01-30: qty 2
  Filled 2017-01-30: qty 1

## 2017-01-30 MED ORDER — LORAZEPAM 2 MG/ML IJ SOLN
0.5000 mg | INTRAMUSCULAR | Status: DC | PRN
  Administered 2017-01-30 (×3): 1 mg via INTRAVENOUS
  Administered 2017-01-31: 2 mg via INTRAVENOUS
  Administered 2017-01-31 (×3): 1 mg via INTRAVENOUS
  Filled 2017-01-30 (×5): qty 1

## 2017-01-30 MED ORDER — PANTOPRAZOLE SODIUM 40 MG IV SOLR: 40.0000 mg | Freq: Two times a day (BID) | INTRAVENOUS | Status: DC

## 2017-01-30 NOTE — Progress Notes (Signed)
Family requested to take patient off the BiPAP temporarily to see if she could talk to them and respond.  Patient placed on 15L/HFNC at 1755.  By 4580, patient was showing distress and re-placed on BiPAP per previous settings at 80% FiO2.

## 2017-01-30 NOTE — Progress Notes (Signed)
PROGRESS NOTE    Alexandria Chen  SJG:[id number] DOB: [birth year] DOA: 01/09/2017 PCP: [name], FNP  Brief Narrative: Alexandria Chen  Is a 82 year old woman admitted from home on 9/14 due being short of breath. She has history of atrial fibrillation, anxiety, depression hypertension and lung cancer recent finishing chemotherapy and radiation. She was recently treated for pneumonia back in August to [facility] on 9/14 with difficulty breathing and weakness. Patient was found to be hypoxic and was brought into the hospital for evaluation.She was started on antibiotics as there was concern of healthcare associated pneumonia, also was started on Lasix as there also was concern of acute diastolic dysfunction. Patient did not show signs of improvement and had increasing oxygen requirement up to 100% non-rebreather mask. Patient subsequently was transferred to [facility] given the lack of pulmonary service at [facility] and she was admitted to the SDU.   Patient was continued on oxygen supplementation and was started on steroids subsequently transferred to [facility] for further management. She remains dyspenic and desaturates on minimal exertion and it is felt that it is related to Amiodarone Toxicity vs. Radiation Pneumonitis and that it may take several weeks for her to clinically improve. Pulmonary ordered a HRCT and looked worse. On the night of 9/27 patient had Dyspnea and was placed on BiPAP then NRB and transitioned to 15 Liters of HFNC. Pulmonary increased Steroids to IV 125 q6h as patient has not progressed and PCCM feels Palliative Care should be consulted to address EOL Care and Goals of Care. Patient continues to Desaturate and Palliative Care had a family meeting yesterday. Overnight the patient did well but this AM she decompensated and had to be placed back on BiPAP and was struggling. Palliative care and I met with some of the family members this AM and later Palliative Care  discussed with all the family and patient's children elected to make her DNR and started leaning towards Comfort Care.   Assessment & Plan:   Principal Problem:   Hypoxia Active Problems:   Depression   Essential hypertension, benign   Hyperlipidemia   Atrial fibrillation (HCC) [I48.91]   HCAP (healthcare-associated pneumonia)   Bronchospasm   Acute respiratory failure (HCC)   Hyponatremia   Hyperkalemia   Amiodarone pulmonary toxicity   Radiation pneumonitis ([place])   Palliative care by specialist  Acute respiratory failure with hypoxia due in the setting of ILD and Squamous Cell Lung Cancer with component of radiation pneumonitis versus Amiodarone Toxicity now requiring NIPPV with BiPAP  -Failure to improve, worsening over 2 week course -Having episodes of syncope due to hypoxia  -PCCM started IV Steroids now 125 mg IV q6h and will continue for now; po Protonix changed to IV Protonix  -Lasix D/C'd by Cardiology Dr. Einar Gip -Completed course of antibiotic due to concerns of pneumonia with Vancomycin and Cefepime  -C/w Levalbuterol 1.25 mg / Ipratropium 0.5 mg Neb TID -PCCM recommendations appreciated; Recommend to continue to Hold Amiodarone -Pulmonary ordered HRCT and showed marked worsening of patchy multifocal interstitial airspace disease asymmetrically distributed throughout the lungs bilaterally with findings reflective of postradiation changes of sever radiation pneumonitis and radiation fibrosis -C/w Flutter Valve and Incentive Spirometry, Supportive Care with Guaifenesin 600 mg po BID -C/w PT to Assist with Mobilization -CXR 01/25/17 showed PowerPort catheter in stable position. Stable right lung mass. Stable cardiomegaly. Persistent but improved bilateral pulmonary interstitial infiltrates and or edema . A component these changes may be related radiation treatment. -Discussed  with Patient's Primary Radiation Oncologist Dr. Quitman Livings and he feels that there is a component of  Radiation Pneumonitis and recommended Steroids but patient has not been responsive to Steroids  -Continues to Have significant amount of O2 -Appreciate Palliative Care Medicine help. Patient's Family made her DNR and are leaning toward comfort. Will continue with BiPAP with alternating to 15 Liters of [place]; Will continue Steriods and All other medications for now. Palliative Adding Morphine IV 2-4 mg IV q2hprn and Ativan IV 0.5-1 mg q2hprn    Syncope due to Hypoxia with Exertion  -See Above -Continue to Mobilize and OOB with Assistance if possible   Chronic diastolic HF  -Weight was up slightly - Lasix increased by Pulmonary Team but discontinued by Cardiology -Continue to monitor and repeat CMP -Strict I's/O's, Daily Weights; -Patient is -10.996 Liters and Weight is now 130 lbs  -Continue to Monitor Volume Status   A. Fib with RVR, improved and now Bradycardiac Rate -Patient was on amiodarone but due to possible toxicity this was discontinued -CHADS2-VASc of 4 -Cardizem 90 mg twice a day and Metoprolol 25 mg po BID if patient can tolerate po. Added IV Metoprolol 2.5 mg q6h for HR >130 -Cardiology consulted - Dr Einar Gip and appreciated Recc's -C/w Anticoagulation with Warfarin per Pharmacy Dosing    Hypertension -Stable and BP controlled  -C/w Hydralazine 10 mg IV q8hprn for SBP >180 or DBP >100  Chronic Pain Syndrome -Continue Gabapentin 100 mg po qHS if able to take po  Depression with Anxiety -No suicidal or homicidal ideation  -Continue Duloxetine 60 mg po Daily and Buspirone 15 mg po BID -Palliative Care added Ativan 0.5-1 mg q2hprn   Leukocytosis -Likely Steroid Demargination vs. Reactive from Lung Cancer -Patient is s/p Treatment with Abx -WBC went from 11.8 -> 12.9 -> 14.7 -> 15.2 -> 16.4 -> 17.3 -> 15.5 -> 16.6 -Continue to Monitor for S/Sx of Infection -Repeat CBC in AM   Hyponatremia -Mild at 131; Patient's Diuresis stopped 01/25/17 per Cards -Continue to  Monitor and repeat CMP in AM   Abdominal Distention, slightly improved -Likely from BiPAP -Ordered KUB and showed Scattered large and small bowel gas is noted. No obstructive changes are seen. Calcified uterine fibroids are noted. Mild retained fecal material is noted in the right colon. No free air is seen. No acute bony abnormality is noted. -Continue to Monitor   Hyperkalemia, improved -Was mild at 5.2 and then improved to 4.3 -Stopped K+ Supplementation -Continue to Monitor and Repeat CMP in AM   DVT prophylaxis: Anticoagulated with Warfarin Code Status: DNR Family Communication: Discussed with Daughters and family at bedside and explained poor prognosis  Disposition Plan: Remain Inpatient at this time; Patient will likely have an in hospital Death given high risk of decompensation  Consultants:   PCCM  Cardiology  Palliative Care Medicine   Procedures: None   Antimicrobials:  Anti-infectives    Start     Dose/Rate Route Frequency Ordered Stop   01/20/17 1000  vancomycin (VANCOCIN) IVPB 750 mg/150 ml premix  Status:  Discontinued     750 mg 150 mL/hr over 60 Minutes Intravenous Every 12 hours 01/20/17 0925 01/22/17 1249   01/17/17 1800  vancomycin (VANCOCIN) 500 mg in sodium chloride 0.9 % 100 mL IVPB  Status:  Discontinued     500 mg 100 mL/hr over 60 Minutes Intravenous Every 12 hours 01/17/17 0934 01/20/17 0925   01/17/17 0700  vancomycin (VANCOCIN) IVPB 1000 mg/200 mL premix  1,000 mg 200 mL/hr over 60 Minutes Intravenous  Once 01/17/17 0650 01/17/17 0800   01/16/17 0000  cefpodoxime (VANTIN) 200 MG tablet     200 mg Oral 2 times daily 01/16/17 0841 01/23/17 2359   01/15/17 0600  ceFEPIme (MAXIPIME) 1 g in dextrose 5 % 50 mL IVPB  Status:  Discontinued     1 g 100 mL/hr over 30 Minutes Intravenous Every 24 hours 01/30/2017 0819 01/22/17 1249   01/02/2017 0530  cefTRIAXone (ROCEPHIN) 1 g in dextrose 5 % 50 mL IVPB  Status:  Discontinued     1 g 100 mL/hr over  30 Minutes Intravenous  Once 01/30/2017 0518 01/07/2017 0526   01/20/2017 0530  ceFEPIme (MAXIPIME) 2 g in dextrose 5 % 50 mL IVPB     2 g 100 mL/hr over 30 Minutes Intravenous  Once 01/20/2017 0526 01/12/2017 0802     Subjective: Seen and examined and was on BiPAP resting after receiving Ativan. Would not respond to verbal commands. Discussed poor prognosis with family and they were very tearful but understood that their mother was suffering.   Objective: Vitals:   01/30/17 1221 01/30/17 1441 01/30/17 1601 01/30/17 1941  BP: 122/68  (!) 155/75 (!) 155/75  Pulse: (!) 59  73 67  Resp: 18  (!) 22 17  Temp: 97.8 F (36.6 C)  97.7 F (36.5 C)   TempSrc: Axillary  Axillary   SpO2: 93% 93% 98% 96%  Weight:      Height:        Intake/Output Summary (Last 24 hours) at 01/30/17 2040 Last data filed at 01/30/17 1225  Gross per 24 hour  Intake                0 ml  Output              375 ml  Net             -375 ml   Filed Weights   01/28/17 0357 01/29/17 0501 01/30/17 2336  Weight: 62.1 kg (136 lb 14.5 oz) 58.9 kg (129 lb 13.6 oz) 59.3 kg (130 lb 11.7 oz)   Examination: Physical Exam:  Constitutional: Frail elderly AAF who is somnolent and on BiPAP Eyes: Lids normal. Sclerae anicteric  ENMT: External ears and nose appear normal.  Neck: Supple with no JVD Respiratory: Slightly increased WOB on BiPAP with diminished coarse breath sounds with scattered crackles. No appreciable wheezing Cardiovascular: RRR; No extremity edema Abdomen: Soft, NT, Slightly distended. Bowel sounds present GU: Deferred Musculoskeletal: No contractures. No cyanosis Skin: Cool and dry. No rashes or lesions on a limited skin eval Neurologic: No appreciable focal deficits. Minimally responds to verbal commands as patient received ativan earlier Psychiatric: Impaired judgment and insight. Somnolent  Data Reviewed: I have personally reviewed following labs and imaging studies  CBC:  Recent Labs Lab  01/26/17 0253 01/27/17 0333 01/28/17 0330 01/29/17 0302 01/30/17 0305  WBC 15.2* 16.4* 17.3* 15.5* 16.6*  NEUTROABS  --  15.2* 16.0* 14.6* 15.4*  HGB 9.0* 9.5* 9.1* 9.2* 9.3*  HCT 27.9* 29.4* 28.5* 28.7* 29.0*  MCV 88.3 88.3 88.0 88.0 87.6  PLT 374 407* 350 338 122   Basic Metabolic Panel:  Recent Labs Lab 01/25/17 0442 01/26/17 0253 01/27/17 0333 01/28/17 0330 01/29/17 0302 01/30/17 0305  NA 133* 133* 133* 132* 131* 131*  K 4.4 4.5 4.9 5.2* 4.8 4.3  CL 95* 97* 98* 100* 99* 99*  CO2 _0 26  GLUCOSE 172* 142* 150* 160* 206* 182*  BUN 27* 26* 27* 31* 26* 19  CREATININE 0.86 0.82 0.78 0.88 0.81 0.68  CALCIUM 8.8* 8.8* 9.0 8.8* 8.7* 8.7*  MG 2.3  --  2.3 2.5* 2.3 2.2  PHOS 3.4  --  3.2 3.5 3.0 2.9   GFR: Estimated Creatinine Clearance: 44.4 mL/min (by C-G formula based on SCr of 0.68 mg/dL). Liver Function Tests:  Recent Labs Lab 01/27/17 0333 01/28/17 0330 01/29/17 0302 01/30/17 0305  AST _0 ALT 52 41 36 35  ALKPHOS 78 80 80 79  BILITOT 0.4 0.7 0.6 0.5  PROT 6.1* 5.6* 5.5* 5.4*  ALBUMIN 2.1* 2.1* 2.0* 2.1*   No results for input(s): LIPASE, AMYLASE in the last 168 hours. No results for input(s): AMMONIA in the last 168 hours. Coagulation Profile:  Recent Labs Lab 01/26/17 0253 01/27/17 0333 01/28/17 0330 01/29/17 0302 01/30/17 0305  INR 2.70 2.59 3.27 3.65 3.28   Cardiac Enzymes:  Recent Labs Lab 01/25/17 1930 01/26/17 0253  TROPONINI <0.03 <0.03   BNP (last 3 results) No results for input(s): PROBNP in the last 8760 hours. HbA1C: No results for input(s): HGBA1C in the last 72 hours. CBG: No results for input(s): GLUCAP in the last 168 hours. Lipid Profile: No results for input(s): CHOL, HDL, LDLCALC, TRIG, CHOLHDL, LDLDIRECT in the last 72 hours. Thyroid Function Tests: No results for input(s): TSH, T4TOTAL, FREET4, T3FREE, THYROIDAB in the last 72 hours. Anemia Panel: No results for input(s): VITAMINB12, FOLATE,  FERRITIN, TIBC, IRON, RETICCTPCT in the last 72 hours. Sepsis Labs: No results for input(s): PROCALCITON, LATICACIDVEN in the last 168 hours.  Recent Results (from the past 240 hour(s))  MRSA PCR Screening     Status: None   Collection Time: 01/23/17  2:24 PM  Result Value Ref Range Status   MRSA by PCR NEGATIVE NEGATIVE Final    Comment:        The GeneXpert MRSA Assay (FDA approved for NASAL specimens only), is one component of a comprehensive MRSA colonization surveillance program. It is not intended to diagnose MRSA infection nor to guide or monitor treatment for MRSA infections.     Radiology Studies: No results found. Scheduled Meds: . busPIRone  15 mg Oral BID  . diltiazem  90 mg Oral Q12H  . DULoxetine  60 mg Oral Daily  . ipratropium  0.5 mg Nebulization TID  . levalbuterol  1.25 mg Nebulization TID  . magic mouthwash  5 mL Oral TID PC & HS  . methylPREDNISolone (SOLU-MEDROL) injection  125 mg Intravenous Q6H  . metoprolol tartrate  25 mg Oral BID  . pantoprazole (PROTONIX) IV  40 mg Intravenous Q12H  . sodium chloride flush  10-40 mL Intracatheter Q12H  . Warfarin - Pharmacist Dosing Inpatient   Does not apply q1800   Continuous Infusions:   LOS: 14 days   Kerney Elbe, DO [facility] Pager [phone]  If 7PM-7AM, please contact night-coverage [email or web address] Password [email or web address] 01/30/2017, 8:40 PM

## 2017-01-30 NOTE — Progress Notes (Signed)
ANTICOAGULATION CONSULT NOTE - Follow Up Consult  Pharmacy Consult for Coumadin Indication: atrial fibrillation  Allergies  Allergen Reactions  . Amiodarone     Discontinued 01/23/17 for possible pulmonary toxicity    Patient Measurements: Height: 5\' 2"  (157.5 cm) Weight: 130 lb 11.7 oz (59.3 kg) IBW/kg (Calculated) : 50.1   Vital Signs: Temp: 97.3 F (36.3 C) (09/30 0801) Temp Source: Axillary (09/30 0801) BP: 114/94 (09/30 0801) Pulse Rate: 75 (09/30 0801)  Labs:  Recent Labs  01/28/17 0330 01/29/17 0302 01/30/17 0305  HGB 9.1* 9.2* 9.3*  HCT 28.5* 28.7* 29.0*  PLT 350 338 343  LABPROT 33.1* 36.0* 33.1*  INR 3.27 3.65 3.28  CREATININE 0.88 0.81 0.68    Estimated Creatinine Clearance: 44.4 mL/min (by C-G formula based on SCr of 0.68 mg/dL).  Assessment:  Anticoag: Warfarin for hx afib - INR 2.59>3.27>3.6>3.2. CBC stable. No bleeding noted. - Now off Amio - Home regimen: 3mg  SuTh, 1.5mg  MTuWFSa, Admit INR 2.76  Goal of Therapy:  INR 2-3 Monitor platelets by anticoagulation protocol: Yes   Plan:  Continue to hold Coumadin tonight. Daily INR  Erin Hearing PharmD., BCPS Clinical Pharmacist Pager [phone] 01/30/2017 9:31 AM

## 2017-01-30 NOTE — Progress Notes (Signed)
Daily Progress Note   Patient Name: Alexandria Chen       Date: 01/30/2017 DOB: 01-31-1935  Age: 82 y.o. MRN#: [id number] Attending Physician: Kerney Elbe, DO Primary Care Physician: [name], FNP Admit Date: 01/01/2017  Reason for Consultation/Follow-up: Establishing goals of care, Non pain symptom management, Pain control, Psychosocial/spiritual support and Terminal Care  Subjective: When by this morning to see patient in hopes of being able to talk to her directly as per family request, regarding her health status, goals of care. Her daughter [name] is at the bedside and stated her mother had a bad night. She is now on BiPAP this morning as her O2 sats were dropping into the 70s. Per [name], patient was articulating that she felt  scared, she was diaphoretic.   Patient's other daughters [name] and Elzie Rings arrived. Bedside RN [name], as well as Dr. Alfredia Ferguson and myself spoke further to daughters regarding CODE STATUS and our concern that she was rapidly trending towards intubation and unfortunately was reaching the point where she no longer can speak for herself. Family is clearly struggling, they are tearful. Both [name] and myself reiterated that she would not have to suffer with shortness of breath. I reviewed again the role of opioids to manage dyspnea at end-of-life.  I did candidly share with Janine Ores, and [name] that it was my opinion that patient was dying, prognosis of hours to days  Length of Stay: 14  Current Medications: Scheduled Meds:  . busPIRone  15 mg Oral BID  . diltiazem  90 mg Oral Q12H  . DULoxetine  60 mg Oral Daily  . gabapentin  100 mg Oral QHS  . guaiFENesin  600 mg Oral BID  . ipratropium  0.5 mg Nebulization TID  . levalbuterol  1.25 mg Nebulization TID    . magic mouthwash  5 mL Oral TID PC & HS  . methylPREDNISolone (SOLU-MEDROL) injection  125 mg Intravenous Q6H  . metoprolol tartrate  25 mg Oral BID  . pantoprazole  40 mg Oral BID AC  . sodium chloride flush  10-40 mL Intracatheter Q12H  . Warfarin - Pharmacist Dosing Inpatient   Does not apply q1800    Continuous Infusions:   PRN Meds: albuterol, alum & mag hydroxide-simeth, hydrALAZINE, LORazepam, nitroGLYCERIN, sodium chloride, sodium chloride flush  Physical  Exam  Constitutional:  Acutely ill appearing elderly female; minimally responsive now on BiPAP  HENT:  Head: Normocephalic and atraumatic.  Cardiovascular:  Irregular  Pulmonary/Chest:  On BiPAP  Neurological:  Lethargic Will open her eyes to voice  Skin: Skin is warm and dry.  Psychiatric:  Unable to test  Nursing note and vitals reviewed.           Vital Signs: BP (!) 114/94 (BP Location: Left Arm)   Pulse 75   Temp (!) 97.3 F (36.3 C) (Axillary)   Resp (!) 22   Ht '5\' 2"'  (1.575 m)   Wt 59.3 kg (130 lb 11.7 oz)   SpO2 (!) 82%   BMI 23.91 kg/m  SpO2: SpO2: (!) 82 % O2 Device: O2 Device: High Flow Nasal Cannula O2 Flow Rate: O2 Flow Rate (L/min): 15 L/min  Intake/output summary:  Intake/Output Summary (Last 24 hours) at 01/30/17 1034 Last data filed at 01/29/17 1800  Gross per 24 hour  Intake              180 ml  Output              600 ml  Net             -420 ml   LBM: Last BM Date: 01/26/17 Baseline Weight: Weight: 57.2 kg (126 lb) Most recent weight: Weight: 59.3 kg (130 lb 11.7 oz)       Palliative Assessment/Data:    Flowsheet Rows     Most Recent Value  Intake Tab  Referral Department  Hospitalist  Unit at Time of Referral  Intermediate Care Unit  Palliative Care Primary Diagnosis  Pulmonary  Date Notified  01/28/17  Palliative Care Type  New Palliative care  Reason for referral  Clarify Goals of Care, Psychosocial or Spiritual support  Date of Admission  01/30/2017  Date first  seen by Palliative Care  01/29/17  # of days Palliative referral response time  1 Day(s)  # of days IP prior to Palliative referral  14  Clinical Assessment  Palliative Performance Scale Score  40%  Pain Max last 24 hours  Not able to report  Pain Min Last 24 hours  Not able to report  Dyspnea Max Last 24 Hours  Not able to report  Dyspnea Min Last 24 hours  Not able to report  Nausea Max Last 24 Hours  Not able to report  Nausea Min Last 24 Hours  Not able to report  Anxiety Max Last 24 Hours  Not able to report  Anxiety Min Last 24 Hours  Not able to report  Other Max Last 24 Hours  Not able to report  Psychosocial & Spiritual Assessment  Palliative Care Outcomes  Patient/Family meeting held?  Yes  Who was at the meeting?  3 Daughters and son      Patient Active Problem List   Diagnosis Date Noted  . Amiodarone pulmonary toxicity 01/29/2017  . Radiation pneumonitis ([place]) 01/29/2017  . Palliative care by specialist   . Hyponatremia 01/28/2017  . Hyperkalemia 01/28/2017  . Acute respiratory failure ([place])   . Bronchospasm   . Hypokalemia   . Hypoxia 01/02/2017  . HCAP (healthcare-associated pneumonia)   . At high risk for falls 01/06/2017  . Anemia, unspecified 11/28/2016  . Anticoagulated on warfarin 11/28/2016  . Sepsis ([place]) 11/27/2016  . Syncope 11/20/2016  . Atrial fibrillation ([place]) [I48.91] 09/09/2016  . Squamous cell carcinoma of right lung ([place]) 07/13/2016  . Syncope  and collapse 04/14/2015  . Otitis externa of right ear 12/18/2014  . Hearing loss due to cerumen impaction 12/18/2014  . Essential hypertension, benign 01/14/2014  . Hyperlipidemia 01/14/2014  . Vitamin D deficiency 01/14/2014  . Alcohol dependence in remission ([place]) 10/15/2011    Class: Acute  . Depression 08/31/2011  . Anxiety 08/31/2011    Palliative Care Assessment & Plan   Patient Profile: 82 y.o. female  with past medical history of diastolic heart failure, COPD, hyperlipidemia, atrial  fib, depression with anxiety, squamous cell lung cancer status post chemotherapy and radiation treatment (completed in June 2018) admitted on 01/01/2017 to [facility]  with shortness of breath. Despite supportive therapy, steroids, antibiotics patient's respiratory status continued to worsen and she was transferred to [facility] for additional critical care medicine/pulmonology support. Repeat chest CT shows markedly worsening interstitial disease; likely postradiation fibrosis/pneumonitis. On the evening of 01/02/17, her respiratory status worsened and she was placed on BiPAP and steroids were increased. Her O2 sats are dropping with any movement. Usual treatment for radiation pneumonitis is usually steroids per pulmonologist's consult, but unfortunately she has not been responding. Per chart review further workup such as biopsy is too risky given patient's status   Patient has declined further overnight and is now on BiPAP. Palliative medicine involved with patient and family in terms of goals of care decisions   Assessment: Met with pt's family: Ilean China and Ollen Gross along with Lynelle Smoke, RN. Family is tearful but all in agreement for DNR/DNI. They do understand that she is nearing EOL and will likely die within hours to days. Also reviewed role of opioids again and family in agreement to utilize ativan, MS04 to maintain comfort  Recommendations/Plan:  I recommended DO NOT RESUSCITATE/DO NOT INTUBATE. Family now in agreement  Continue BIPAP alternating with 15L [place]  Continue steroids and other RX for now  Low dose MS04 2-4 mg IV q2 PRN. Monitor for need for continuous infusion   Change ativan IV 0.5-1 mg q2 PRN    Goals of Care and Additional Recommendations:  Limitations on Scope of Treatment:Not full comfort care yet but limits have been set not to progress to futile measures of CPR, defibrillation, intubation  Code Status:    Code Status Orders          Start     Ordered   01/26/2017 0608  Full code  Continuous     01/29/2017 0610    Code Status History    Date Active Date Inactive Code Status Order ID Comments User Context   11/27/2016  6:07 PM 12/06/2016  7:28 PM Full Code [id number]  Karmen Bongo, MD ED   11/20/2016  4:13 PM 11/21/2016  5:29 PM Full Code [id number]  Arrien, Jimmy Picket, MD ED       Prognosis:   Hours - Days  Discharge Planning:  Likely hospital death  Care plan was discussed with Dr. Lenox Ponds, RN  Thank you for allowing the Palliative Medicine Team to assist in the care of this patient.   Time In: 0900 Time Out: 1030 Total Time 90 min Prolonged Time Billed  yes       Greater than 50%  of this time was spent counseling and coordinating care related to the above assessment and plan.  Dory Horn, NP  Please contact Palliative Medicine Team phone at [phone] for questions and concerns.

## 2017-01-30 NOTE — Progress Notes (Signed)
Long discussion w/all five of patient's children re: present status and GOC w/myself and Romona Curls, NP for Palliative Care.  All five children in agreement w/DNR, trending towards comfort care, for patient at this time.  Plan will continue w/BiPAP as needed and additional medications for respiratory distress and/or comfort.  All children agreed w/opiods and Ativan.

## 2017-01-30 NOTE — Progress Notes (Signed)
Took pt off BIPAP and placed on 15L high flow

## 2017-01-31 LAB — CBC WITH DIFFERENTIAL/PLATELET
Basophils Absolute: 0 10*3/uL (ref 0.0–0.1)
Basophils Relative: 0 %
EOS ABS: 0 10*3/uL (ref 0.0–0.7)
Eosinophils Relative: 0 %
HEMATOCRIT: 27.6 % — AB (ref 36.0–46.0)
HEMOGLOBIN: 9.2 g/dL — AB (ref 12.0–15.0)
LYMPHS ABS: 0.5 10*3/uL — AB (ref 0.7–4.0)
Lymphocytes Relative: 4 %
MCH: 29.2 pg (ref 26.0–34.0)
MCHC: 33.3 g/dL (ref 30.0–36.0)
MCV: 87.6 fL (ref 78.0–100.0)
MONOS PCT: 5 %
Monocytes Absolute: 0.7 10*3/uL (ref 0.1–1.0)
NEUTROS PCT: 91 %
Neutro Abs: 13.1 10*3/uL — ABNORMAL HIGH (ref 1.7–7.7)
Platelets: 305 10*3/uL (ref 150–400)
RBC: 3.15 MIL/uL — ABNORMAL LOW (ref 3.87–5.11)
RDW: 17.3 % — ABNORMAL HIGH (ref 11.5–15.5)
WBC: 14.3 10*3/uL — ABNORMAL HIGH (ref 4.0–10.5)

## 2017-01-31 LAB — PROTIME-INR
INR: 2.92
Prothrombin Time: 30.2 seconds — ABNORMAL HIGH (ref 11.4–15.2)

## 2017-01-31 LAB — COMPREHENSIVE METABOLIC PANEL
ALK PHOS: 63 U/L (ref 38–126)
ALT: 28 U/L (ref 14–54)
AST: 18 U/L (ref 15–41)
Albumin: 2 g/dL — ABNORMAL LOW (ref 3.5–5.0)
Anion gap: 7 (ref 5–15)
BILIRUBIN TOTAL: 0.4 mg/dL (ref 0.3–1.2)
BUN: 28 mg/dL — ABNORMAL HIGH (ref 6–20)
CALCIUM: 8.7 mg/dL — AB (ref 8.9–10.3)
CO2: 26 mmol/L (ref 22–32)
Chloride: 100 mmol/L — ABNORMAL LOW (ref 101–111)
Creatinine, Ser: 0.73 mg/dL (ref 0.44–1.00)
GFR calc non Af Amer: >60 mL/min (ref >60)
Glucose, Bld: 178 mg/dL — ABNORMAL HIGH (ref 65–99)
Potassium: 4.3 mmol/L (ref 3.5–5.1)
SODIUM: 133 mmol/L — AB (ref 135–145)
TOTAL PROTEIN: 5.2 g/dL — AB (ref 6.5–8.1)

## 2017-01-31 LAB — PHOSPHORUS: PHOSPHORUS: 3.4 mg/dL (ref 2.5–4.6)

## 2017-01-31 LAB — MAGNESIUM: MAGNESIUM: 2.3 mg/dL (ref 1.7–2.4)

## 2017-01-31 LAB — GLUCOSE, CAPILLARY: GLUCOSE-CAPILLARY: 492 mg/dL — AB (ref 65–99)

## 2017-01-31 MED ORDER — ORAL CARE MOUTH RINSE
15.0000 mL | Freq: Two times a day (BID) | OROMUCOSAL | Status: DC
  Administered 2017-01-31 – 2017-02-01 (×2): 15 mL via OROMUCOSAL

## 2017-01-31 MED ORDER — LORAZEPAM 2 MG/ML IJ SOLN: 2.0000 mg | INTRAMUSCULAR | Status: DC | PRN

## 2017-01-31 MED ORDER — MIDAZOLAM HCL 2 MG/2ML IJ SOLN
2.0000 mg | Freq: Once | INTRAMUSCULAR | Status: AC
  Administered 2017-01-31: 2 mg via INTRAVENOUS

## 2017-01-31 MED ORDER — CHLORHEXIDINE GLUCONATE 0.12 % MT SOLN: 15.0000 mL | Freq: Two times a day (BID) | OROMUCOSAL | Status: DC

## 2017-01-31 MED ORDER — HYDROMORPHONE HCL 1 MG/ML IJ SOLN
2.0000 mg | Freq: Once | INTRAMUSCULAR | Status: DC
  Filled 2017-01-31: qty 2

## 2017-01-31 MED ORDER — LORAZEPAM 2 MG/ML IJ SOLN
INTRAMUSCULAR | Status: AC
  Filled 2017-01-31: qty 1

## 2017-01-31 MED ORDER — GLYCOPYRROLATE 0.2 MG/ML IJ SOLN
0.2000 mg | INTRAMUSCULAR | Status: DC | PRN
  Administered 2017-02-02 (×2): 0.2 mg via INTRAVENOUS
  Filled 2017-01-31 (×2): qty 1

## 2017-01-31 MED ORDER — MORPHINE SULFATE (PF) 2 MG/ML IV SOLN
2.0000 mg | INTRAVENOUS | Status: DC | PRN
  Administered 2017-01-31 (×3): 4 mg via INTRAVENOUS
  Filled 2017-01-31 (×2): qty 2

## 2017-01-31 MED ORDER — MIDAZOLAM HCL 2 MG/2ML IJ SOLN
INTRAMUSCULAR | Status: AC
  Filled 2017-01-31: qty 2

## 2017-01-31 MED ORDER — MIDAZOLAM HCL 2 MG/2ML IJ SOLN
2.0000 mg | INTRAMUSCULAR | Status: DC | PRN
  Administered 2017-02-01 – 2017-02-02 (×7): 2 mg via INTRAVENOUS
  Filled 2017-01-31 (×7): qty 2

## 2017-01-31 MED ORDER — WARFARIN SODIUM 2 MG PO TABS: 1.0000 mg | ORAL_TABLET | Freq: Once | ORAL | Status: DC

## 2017-01-31 MED ORDER — HYDROMORPHONE HCL 1 MG/ML IJ SOLN
2.0000 mg | INTRAMUSCULAR | Status: DC | PRN
  Administered 2017-02-01 – 2017-02-02 (×10): 2 mg via INTRAVENOUS
  Filled 2017-01-31 (×10): qty 2

## 2017-01-31 NOTE — Progress Notes (Signed)
PROGRESS NOTE    Alexandria Chen  XWR:[id number] DOB: [birth year] DOA: 01/22/2017 PCP: [name], FNP  Brief Narrative: Alexandria Chen  Is a 82 year old woman admitted from home on 9/14 due being short of breath. She has history of atrial fibrillation, anxiety, depression hypertension and lung cancer recent finishing chemotherapy and radiation. She was recently treated for pneumonia back in August to [facility] on 9/14 with difficulty breathing and weakness. Patient was found to be hypoxic and was brought into the hospital for evaluation.She was started on antibiotics as there was concern of healthcare associated pneumonia, also was started on Lasix as there also was concern of acute diastolic dysfunction. Patient did not show signs of improvement and had increasing oxygen requirement up to 100% non-rebreather mask. Patient subsequently was transferred to [facility] given the lack of pulmonary service at [facility] and she was admitted to the SDU.   Patient was continued on oxygen supplementation and was started on steroids subsequently transferred to [facility] for further management. She remains dyspenic and desaturates on minimal exertion and it is felt that it is related to Amiodarone Toxicity vs. Radiation Pneumonitis and that it may take several weeks for her to clinically improve. Pulmonary ordered a HRCT and looked worse. On the night of 9/27 patient had Dyspnea and was placed on BiPAP then NRB and transitioned to 15 Liters of HFNC. Pulmonary increased Steroids to IV 125 q6h as patient has not progressed and PCCM feels Palliative Care should be consulted to address EOL Care and Goals of Care. Patient continues to Desaturate and Palliative Care had a family meeting yesterday. Overnight the patient did well but this AM she decompensated and had to be placed back on BiPAP and was struggling. Palliative care and I met with some of the family members this AM and later Palliative Care  discussed with all the family and patient's children elected to make her DNR. Patient is actively dying and is now [place] and anticipate in hospital death as patient has poor prognosis and high risk for decompensation.  Assessment & Plan:   Principal Problem:   Hypoxia Active Problems:   Depression   Essential hypertension, benign   Hyperlipidemia   Atrial fibrillation (HCC) [I48.91]   HCAP (healthcare-associated pneumonia)   Bronchospasm   Acute respiratory failure (HCC)   Hyponatremia   Hyperkalemia   Amiodarone pulmonary toxicity   Radiation pneumonitis ([place])   Palliative care by specialist  Acute respiratory failure with hypoxia due in the setting of ILD and Squamous Cell Lung Cancer with component of radiation pneumonitis versus Amiodarone Toxicity now requiring NIPPV with BiPAP  -Failure to improve, worsening over 2 week course -Having episodes of syncope due to hypoxia  -Lasix D/C'd by Cardiology Dr. Einar Gip -Completed course of antibiotic due to concerns of pneumonia with Vancomycin and Cefepime  -PCCM recommendations appreciated; Recommend to continue to Hold Amiodarone -Pulmonary ordered HRCT and showed marked worsening of patchy multifocal interstitial airspace disease asymmetrically distributed throughout the lungs bilaterally with findings reflective of postradiation changes of sever radiation pneumonitis and radiation fibrosis -CXR 01/25/17 showed PowerPort catheter in stable position. Stable right lung mass. Stable cardiomegaly. Persistent but improved bilateral pulmonary interstitial infiltrates and or edema . A component these changes may be related radiation treatment. -Discussed with Patient's Primary Radiation Oncologist Dr. Quitman Livings and he feels that there is a component of Radiation Pneumonitis and recommended Steroids but patient has not been responsive to Steroids  -Continues to Have  significant amount of O2 -Appreciate Palliative Care Medicine  help. Patient's Family made her DNR yesterday and is now full Comfort Measures -All medications not focused on patient's comfort discontinued as patient is actively dying -C/w Albuterol Nebs 2.5 mg Neb q4hprn Wheezing/SOB, Chlorhexidene 15 mL Mouth Rinse BID, Glycopyrrolate 0.2 mg IV q4hprn for secretions, Hydropmorphone 2 mg IV q15 minprn, Versed 2 mg IV q1hprn Agitation, Morphine 2-4 mg IV q50mn pRN severe Dyspnea and Sodium Chloride Spray  Syncope due to Hypoxia with Exertion  -See Above  Chronic diastolic HF  -Weight was up slightly - Lasix increased by Pulmonary Team but discontinued by Cardiology  -Patient is -12.076 Liters and Weight is now 130 lbs  -Patient now Full Comfort Measures  A. Fib with RVR, improved and now Bradycardiac Rate -Patient was on amiodarone but due to possible toxicity this was discontinued -CHADS2-VASc of 4 -Cardiology consulted - Dr GEinar Gipand appreciated Recc's -C/w Anticoagulation with Warfarin per Pharmacy stopped as now patient is Full COMFORT  Hypertension -C/w Hydralazine 10 mg IV q8hprn for SBP >180 or DBP >100  Chronic Pain Syndrome -As above  Depression with Anxiety -Patient now Comfort    Leukocytosis -Likely Steroid Demargination vs. Reactive from Lung Cancer -Patient is s/p Treatment with Abx -WBC went from 11.8 -> 12.9 -> 14.7 -> 15.2 -> 16.4 -> 17.3 -> 15.5 -> 16.6 -> 14.3 -Patient now Comfort and will not check Labs  Hyponatremia -Mild at 133; Patient's Diuresis stopped 01/25/17 per Cards -Patient now Comfort and will not check Labs  Abdominal Distention, slightly improved -Likely from BiPAP -Patient now Comfort   Hyperkalemia, improved -Was mild at 5.2 and then improved to 4.3 -Stopped K+ Supplementation -Patient now Comfort and will not check Labs  DVT prophylaxis: None as patient is FULL COMFORT MEASURES Code Status: DNR Family Communication: Discussed with Daughters and family at bedside and explained poor  prognosis  Disposition Plan: Patient will likely have an in hospital Death given high risk of decompensation  Consultants:   PCCM  Cardiology  Palliative Care Medicine   Procedures: None   Antimicrobials:  Anti-infectives    Start     Dose/Rate Route Frequency Ordered Stop   01/20/17 1000  vancomycin (VANCOCIN) IVPB 750 mg/150 ml premix  Status:  Discontinued     750 mg 150 mL/hr over 60 Minutes Intravenous Every 12 hours 01/20/17 0925 01/22/17 1249   01/17/17 1800  vancomycin (VANCOCIN) 500 mg in sodium chloride 0.9 % 100 mL IVPB  Status:  Discontinued     500 mg 100 mL/hr over 60 Minutes Intravenous Every 12 hours 01/17/17 0934 01/20/17 0925   01/17/17 0700  vancomycin (VANCOCIN) IVPB 1000 mg/200 mL premix     1,000 mg 200 mL/hr over 60 Minutes Intravenous  Once 01/17/17 0650 01/17/17 0800   01/16/17 0000  cefpodoxime (VANTIN) 200 MG tablet     200 mg Oral 2 times daily 01/16/17 0841 01/23/17 2359   01/15/17 0600  ceFEPIme (MAXIPIME) 1 g in dextrose 5 % 50 mL IVPB  Status:  Discontinued     1 g 100 mL/hr over 30 Minutes Intravenous Every 24 hours 01/20/2017 0819 01/22/17 1249   01/06/2017 0530  cefTRIAXone (ROCEPHIN) 1 g in dextrose 5 % 50 mL IVPB  Status:  Discontinued     1 g 100 mL/hr over 30 Minutes Intravenous  Once 01/23/2017 0518 01/12/2017 0526   01/19/2017 0530  ceFEPIme (MAXIPIME) 2 g in dextrose 5 % 50 mL IVPB  2 g 100 mL/hr over 30 Minutes Intravenous  Once 01/16/2017 0526 01/07/2017 4128     Subjective: Seen and examined and was on BiPAP resting. Had tried to remove BiPAP yesterday but patient immediately desaturated. Family at beside and tearful.   Objective: Vitals:   01/31/17 1634 01/31/17 1652 01/31/17 1752 01/31/17 1759  BP:      Pulse: 66 100 84 83  Resp: (!) 26 (!) '26 16 16  ' Temp:      TempSrc:      SpO2: 99% 93% (!) 52% (!) 59%  Weight:      Height:        Intake/Output Summary (Last 24 hours) at 01/31/17 1924 Last data filed at 01/31/17 1203   Gross per 24 hour  Intake                0 ml  Output              600 ml  Net             -600 ml   Filed Weights   01/28/17 0357 01/29/17 0501 01/30/17 0614  Weight: 62.1 kg (136 lb 14.5 oz) 58.9 kg (129 lb 13.6 oz) 59.3 kg (130 lb 11.7 oz)   Examination: Physical Exam:  Constitutional: Thin frail and ill appearing AAF who is resting on BiPAP Eyes: Lids normal ENMT: External ears and nose appear normal Neck: Supple with no JVD Respiratory: Slightly tachypenic. On BiPAP. Diminished breath sounds with coarse and scattered crackles Cardiovascular: Tachycardic rate.  Abdomen: Soft, NT, ND GU: Deferred Musculoskeletal: No contractures. Skin: Cool and dry. No rashes or lesions noted Neurologic: No appreciable focal deficits Psychiatric: Somnolent and not very arousable. Impaired judgement and insight  Data Reviewed: I have personally reviewed following labs and imaging studies  CBC:  Recent Labs Lab 01/27/17 0333 01/28/17 0330 01/29/17 0302 01/30/17 0305 01/31/17 0320  WBC 16.4* 17.3* 15.5* 16.6* 14.3*  NEUTROABS 15.2* 16.0* 14.6* 15.4* 13.1*  HGB 9.5* 9.1* 9.2* 9.3* 9.2*  HCT 29.4* 28.5* 28.7* 29.0* 27.6*  MCV 88.3 88.0 88.0 87.6 87.6  PLT 407* 350 338 343 208   Basic Metabolic Panel:  Recent Labs Lab 01/27/17 0333 01/28/17 0330 01/29/17 0302 01/30/17 0305 01/31/17 0320  NA 133* 132* 131* 131* 133*  K 4.9 5.2* 4.8 4.3 4.3  CL 98* 100* 99* 99* 100*  CO2 '28 25 26 26 26  ' GLUCOSE 150* 160* 206* 182* 178*  BUN 27* 31* 26* 19 28*  CREATININE 0.78 0.88 0.81 0.68 0.73  CALCIUM 9.0 8.8* 8.7* 8.7* 8.7*  MG 2.3 2.5* 2.3 2.2 2.3  PHOS 3.2 3.5 3.0 2.9 3.4   GFR: Estimated Creatinine Clearance: 44.4 mL/min (by C-G formula based on SCr of 0.73 mg/dL). Liver Function Tests:  Recent Labs Lab 01/27/17 0333 01/28/17 0330 01/29/17 0302 01/30/17 0305 01/31/17 0320  AST '24 20 20 21 18  ' ALT 52 41 36 35 28  ALKPHOS 78 80 80 79 63  BILITOT 0.4 0.7 0.6 0.5 0.4  PROT  6.1* 5.6* 5.5* 5.4* 5.2*  ALBUMIN 2.1* 2.1* 2.0* 2.1* 2.0*   No results for input(s): LIPASE, AMYLASE in the last 168 hours. No results for input(s): AMMONIA in the last 168 hours. Coagulation Profile:  Recent Labs Lab 01/27/17 0333 01/28/17 0330 01/29/17 0302 01/30/17 0305 01/31/17 0320  INR 2.59 3.27 3.65 3.28 2.92   Cardiac Enzymes:  Recent Labs Lab 01/25/17 1930 01/26/17 0253  TROPONINI <0.03 <0.03   BNP (last  3 results) No results for input(s): PROBNP in the last 8760 hours. HbA1C: No results for input(s): HGBA1C in the last 72 hours. CBG:  Recent Labs Lab 01/31/17 1724  GLUCAP 492*   Lipid Profile: No results for input(s): CHOL, HDL, LDLCALC, TRIG, CHOLHDL, LDLDIRECT in the last 72 hours. Thyroid Function Tests: No results for input(s): TSH, T4TOTAL, FREET4, T3FREE, THYROIDAB in the last 72 hours. Anemia Panel: No results for input(s): VITAMINB12, FOLATE, FERRITIN, TIBC, IRON, RETICCTPCT in the last 72 hours. Sepsis Labs: No results for input(s): PROCALCITON, LATICACIDVEN in the last 168 hours.  Recent Results (from the past 240 hour(s))  MRSA PCR Screening     Status: None   Collection Time: 01/23/17  2:24 PM  Result Value Ref Range Status   MRSA by PCR NEGATIVE NEGATIVE Final    Comment:        The GeneXpert MRSA Assay (FDA approved for NASAL specimens only), is one component of a comprehensive MRSA colonization surveillance program. It is not intended to diagnose MRSA infection nor to guide or monitor treatment for MRSA infections.     Radiology Studies: No results found. Scheduled Meds: . chlorhexidine  15 mL Mouth Rinse BID  . mouth rinse  15 mL Mouth Rinse q12n4p  . sodium chloride flush  10-40 mL Intracatheter Q12H   Continuous Infusions:   LOS: 15 days   Kerney Elbe, DO [facility] Pager [phone]  If 7PM-7AM, please contact night-coverage [email or web address] Password [email or web address] 01/31/2017, 7:24 PM

## 2017-01-31 NOTE — Progress Notes (Signed)
   01/31/17 1700  Clinical Encounter Type  Visited With Patient and family together  Visit Type Code;Patient actively dying  Referral From Nurse  Consult/Referral To Chaplain  Spiritual Encounters  Spiritual Needs Prayer;Emotional;Grief support  Chaplain was paged to the patient.  Comfort care was provided to PT.  12 family members in the room needed spiritual consult to deal with grief, death and dying.  The PT had not passed away as of 17:45 so chaplain excused himself in preparation for family to share tender moments together.  When the PT passes chaplain to be called for support

## 2017-01-31 NOTE — Progress Notes (Addendum)
Palliative:  Patient is full comfort care and actively dying. Family at bedside - emotional support provided. PRNs available to ensure comfort as discussed with nursing.   No charge  Vinie Sill, NP Palliative Medicine Team Pager # [phone] (M-F 8a-5p) Team Phone # [phone] (Nights/Weekends)

## 2017-01-31 NOTE — Progress Notes (Signed)
ANTICOAGULATION CONSULT NOTE - Follow Up Consult  Pharmacy Consult for Coumadin Indication: atrial fibrillation  Allergies  Allergen Reactions  . Amiodarone     Discontinued 01/23/17 for possible pulmonary toxicity    Patient Measurements: Height: 5\' 2"  (157.5 cm) Weight: 130 lb 11.7 oz (59.3 kg) IBW/kg (Calculated) : 50.1   Vital Signs: Temp: 98 F (36.7 C) (10/01 0744) Temp Source: Axillary (10/01 0744) BP: 125/78 (10/01 0758) Pulse Rate: 101 (10/01 0758)  Labs:  Recent Labs  01/29/17 0302 01/30/17 0305 01/31/17 0320  HGB 9.2* 9.3* 9.2*  HCT 28.7* 29.0* 27.6*  PLT 338 343 305  LABPROT 36.0* 33.1* 30.2*  INR 3.65 3.28 2.92  CREATININE 0.81 0.68 0.73    Estimated Creatinine Clearance: 44.4 mL/min (by C-G formula based on SCr of 0.73 mg/dL).  Assessment:  Anticoag: Warfarin for hx afib - INR 2.59>3.27>3.6>3.2>2.92. CBC stable. No bleeding noted. - Now off Amio - Home regimen: 3mg  SuTh, 1.5mg  MTuWFSa, Admit INR 2.76  Goal of Therapy:  INR 2-3 Monitor platelets by anticoagulation protocol: Yes   Plan:  1. Warfarin 1 mg x 1 this evening  2. Daily INR  Vincenza Hews, PharmD, BCPS 01/31/2017, 8:28 AM

## 2017-01-31 NOTE — Progress Notes (Signed)
Nutrition Brief Note  Chart reviewed. Pt now transitioning to comfort care.  No further nutrition interventions warranted at this time.  Please re-consult as needed.   Alistar Mcenery A. Adelard Sanon, RD, LDN, CDE Pager: [phone] After hours Pager: [phone]  

## 2017-01-31 DEATH — deceased

## 2017-02-01 DIAGNOSIS — Z7189 Other specified counseling: Secondary | ICD-10-CM

## 2017-02-01 MED ORDER — KETOROLAC TROMETHAMINE 15 MG/ML IJ SOLN
15.0000 mg | Freq: Four times a day (QID) | INTRAMUSCULAR | Status: DC | PRN
  Administered 2017-02-02 (×2): 15 mg via INTRAVENOUS
  Filled 2017-02-01 (×2): qty 1

## 2017-02-01 NOTE — Progress Notes (Signed)
Pt on comfort care, HF O2 at 8liters. Family at bedside along with palliative NP.

## 2017-02-01 NOTE — Care Management Note (Signed)
Case Management Note Previous Note Created by Jolene Provost while pt was at [facility]  Patient Details  Name: Alexandria Chen MRN: [id number] Date of Birth: Apr 22, 1935  Expected Discharge Date:     01/22/2017             Expected Discharge Plan:  [place]  In-House Referral:  NA  Discharge planning Services  CM Consult  Post Acute Care Choice:  Home Health, Resumption of Svcs/PTA Provider, Durable Medical Equipment Choice offered to:  Patient  DME Arranged:  Chiropodist, Wheelchair manual, Oxygen DME Agency:  [place] Arranged:  RN, PT, OT, Nurse's Aide, Respirator Therapy HH Agency:  [place]  Status of Service:  Completed, signed off  Additional Comments: 02/01/2017  Pt is now comfort care - Palliative involved.  BIPAP has been removed - pt is on 8 liters HFNC with saturations in the 50's   01/25/17 Pt was transferred to [facility] from [facility] - now on SD.  Pt continues to require HF [place] (am episode of unresponsiveness required rapid response), pt also on  IV steriods and IV lasix.  CM will continue to follow for discharge needs  01/21/17 Anticipate DC over weekend. Pt will have 24/7 supervision. Pt will resume [place] services with [facility], Santiago Glad, [facility] rep, aware of DC planned for weekend, will pull resumption order from Epic. CM requested they make resumption visit as soon as possible as family is very anxious about pt going home on HFNC. RN will verify DC with [facility] rep over weekend. Pt will need WC, neb machine and home oxygen at 10L. Pt/family has chosen [facility] from list of DME providers. Vaughan Basta, [facility] rep, aware of referral and will obtain pt info from chart. DME will be delivered to pt room prior to DC. CM had long discussion with dtr kim about pt's ability to be cared for at home. Pt has ambulated with PT. Family concerned about their inability to monitor vital signs at home, CM explained that pulse ox and BP machine available  OTC. They do not have to monitor continuously, check pt's saturations if pt feels like something has changes, feels SOB or begin to be confused of have AMS. Family told that [facility] providers will be a Microbiologist and will provide pt/family with education and support as needed.   Maryclare Labrador, RN 02/01/2017, 10:26 AM

## 2017-02-01 NOTE — Progress Notes (Signed)
PROGRESS NOTE    Alexandria Chen  KZS:[id number] DOB: 1934/11/28 DOA: 01/28/2017 PCP: [name], FNP  Brief Narrative: Alexandria Chen  Is a 82 year old woman admitted from home on 9/14 due being short of breath. She has history of atrial fibrillation, anxiety, depression hypertension and lung cancer recent finishing chemotherapy and radiation. She was recently treated for pneumonia back in August to [facility] on 9/14 with difficulty breathing and weakness. Patient was found to be hypoxic and was brought into the hospital for evaluation.She was started on antibiotics as there was concern of healthcare associated pneumonia, also was started on Lasix as there also was concern of acute diastolic dysfunction. Patient did not show signs of improvement and had increasing oxygen requirement up to 100% non-rebreather mask. Patient subsequently was transferred to [facility] given the lack of pulmonary service at [facility] and she was admitted to the SDU.   Patient was continued on oxygen supplementation and was started on steroids subsequently transferred to [facility] for further management. She remains dyspenic and desaturates on minimal exertion and it is felt that it is related to Amiodarone Toxicity vs. Radiation Pneumonitis and that it may take several weeks for her to clinically improve. Pulmonary ordered a HRCT and looked worse. On the night of 9/27 patient had Dyspnea and was placed on BiPAP then NRB and transitioned to 15 Liters of HFNC. Pulmonary increased Steroids to IV 125 q6h as patient has not progressed and PCCM feels Palliative Care should be consulted to address EOL Care and Goals of Care. Patient continues to Desaturate and Palliative Care had a family meeting yesterday. Overnight the patient did well but this AM she decompensated and had to be placed back on BiPAP and was struggling. Palliative care and I met with some of the family members on Sunday AM and later Palliative Care  discussed with all the family and patient's children elected to make her DNR. Patient is actively dying and was changed to [place] and anticipate in hospital death as patient has poor prognosis and high risk for decompensation.Patient is off of BiPAP now and on HF Oxygen on 8 Liters and is saturating in the 50's. Imminent Death is Pending.   Assessment & Plan:   Principal Problem:   Hypoxia Active Problems:   Depression   Essential hypertension, benign   Hyperlipidemia   Atrial fibrillation (HCC) [I48.91]   HCAP (healthcare-associated pneumonia)   Bronchospasm   Acute respiratory failure (HCC)   Hyponatremia   Hyperkalemia   Amiodarone pulmonary toxicity   Radiation pneumonitis ([place])   Palliative care by specialist  Acute respiratory failure with hypoxia due in the setting of ILD and Squamous Cell Lung Cancer with component of radiation pneumonitis versus Amiodarone Toxicity now requiring NIPPV with BiPAP  -Failure to improve, worsening over 2 week course -Having episodes of syncope due to hypoxia  -Lasix D/C'd by Cardiology Dr. Einar Gip -Completed course of antibiotic due to concerns of pneumonia with Vancomycin and Cefepime  -PCCM recommendations appreciated; Recommend to continue to Hold Amiodarone -Pulmonary ordered HRCT and showed marked worsening of patchy multifocal interstitial airspace disease asymmetrically distributed throughout the lungs bilaterally with findings reflective of postradiation changes of sever radiation pneumonitis and radiation fibrosis -CXR 01/25/17 showed PowerPort catheter in stable position. Stable right lung mass. Stable cardiomegaly. Persistent but improved bilateral pulmonary interstitial infiltrates and or edema . A component these changes may be related radiation treatment. -Discussed with Patient's Primary Radiation Oncologist Dr. Quitman Livings and  he feels that there is a component of Radiation Pneumonitis and recommended Steroids but patient  has not been responsive to Steroids  -Continues to Have significant amount of O2 -Appreciate Palliative Care Medicine help. Patient's Family made her DNR yesterday and is now full Comfort Measures -All medications not focused on patient's comfort discontinued as patient is actively dying -C/w Albuterol Nebs 2.5 mg Neb q4hprn Wheezing/SOB, Chlorhexidene 15 mL Mouth Rinse BID, Glycopyrrolate 0.2 mg IV q4hprn for secretions, Hydropmorphone 2 mg IV q15 minprn, Versed 2 mg IV q1hprn Agitation, Morphine 2-4 mg IV q47mn pRN severe Dyspnea and Sodium Chloride Spray -Imminent Hospital Death is pending  -Palliative added Ketorolac 15 mg IV q6hprn Fever   Syncope due to Hypoxia with Exertion  -See Above  Chronic diastolic HF  -Weight was up slightly - Lasix increased by Pulmonary Team but discontinued by Cardiology  -Patient is -12.636 Liters and Weight is now 130 lbs  -Patient now Full Comfort Measures  A. Fib with RVR, improved and now Bradycardiac Rate -Patient was on amiodarone but due to possible toxicity this was discontinued -CHADS2-VASc of 4 -Cardiology consulted - Dr GEinar Gipand appreciated Recc's -C/w Anticoagulation with Warfarin per Pharmacy stopped as now patient is Full COMFORT  Hypertension -C/w Hydralazine 10 mg IV q8hprn for SBP >180 or DBP >100  Chronic Pain Syndrome -As above  Depression with Anxiety -Patient now Comfort    Leukocytosis -Likely Steroid Demargination vs. Reactive from Lung Cancer -Patient is s/p Treatment with Abx -WBC went from 11.8 -> 12.9 -> 14.7 -> 15.2 -> 16.4 -> 17.3 -> 15.5 -> 16.6 -> 14.3 -Patient now Comfort and will not check Labs  Hyponatremia -Mild at 133; Patient's Diuresis stopped 01/25/17 per Cards -Patient now Comfort and will not check Labs  Abdominal Distention, slightly improved -Likely from BiPAP -Patient now Comfort   Hyperkalemia, improved -Was mild at 5.2 and then improved to 4.3 -Stopped K+ Supplementation -Patient  now Comfort and will not check Labs  DVT prophylaxis: None as patient is FULL COMFORT MEASURES Code Status: DNR Family Communication: Discussed with and family at bedside and explained poor prognosis and that she is actively dying and that she may pass within a few hours to days Disposition Plan: Patient will likely have an in hospital Death given high risk of Decompensation  Consultants:   PCCM  Cardiology  Palliative Care Medicine   Procedures: None   Antimicrobials:  Anti-infectives    Start     Dose/Rate Route Frequency Ordered Stop   01/20/17 1000  vancomycin (VANCOCIN) IVPB 750 mg/150 ml premix  Status:  Discontinued     750 mg 150 mL/hr over 60 Minutes Intravenous Every 12 hours 01/20/17 0925 01/22/17 1249   01/17/17 1800  vancomycin (VANCOCIN) 500 mg in sodium chloride 0.9 % 100 mL IVPB  Status:  Discontinued     500 mg 100 mL/hr over 60 Minutes Intravenous Every 12 hours 01/17/17 0934 01/20/17 0925   01/17/17 0700  vancomycin (VANCOCIN) IVPB 1000 mg/200 mL premix     1,000 mg 200 mL/hr over 60 Minutes Intravenous  Once 01/17/17 0650 01/17/17 0800   01/16/17 0000  cefpodoxime (VANTIN) 200 MG tablet     200 mg Oral 2 times daily 01/16/17 0841 01/23/17 2359   01/15/17 0600  ceFEPIme (MAXIPIME) 1 g in dextrose 5 % 50 mL IVPB  Status:  Discontinued     1 g 100 mL/hr over 30 Minutes Intravenous Every 24 hours 01/11/2017 0819 01/22/17 1249  01/07/2017 0530  cefTRIAXone (ROCEPHIN) 1 g in dextrose 5 % 50 mL IVPB  Status:  Discontinued     1 g 100 mL/hr over 30 Minutes Intravenous  Once 01/20/2017 0518 01/11/2017 0526   01/15/2017 0530  ceFEPIme (MAXIPIME) 2 g in dextrose 5 % 50 mL IVPB     2 g 100 mL/hr over 30 Minutes Intravenous  Once 01/02/2017 0526 01/04/2017 0254     Subjective: Seen and examined and appeared comfortable. Would not respond to verbal stimuli.   Objective: Vitals:   01/31/17 1634 01/31/17 1652 01/31/17 1752 01/31/17 1759  BP:      Pulse: 66 100 84 83  Resp:  (!) 26 (!) '26 16 16  ' Temp:      TempSrc:      SpO2: 99% 93% (!) 52% (!) 59%  Weight:      Height:        Intake/Output Summary (Last 24 hours) at 02/01/17 1143 Last data filed at 02/01/17 0900  Gross per 24 hour  Intake               20 ml  Output              400 ml  Net             -380 ml   Filed Weights   01/28/17 0357 01/29/17 0501 01/30/17 2706  Weight: 62.1 kg (136 lb 14.5 oz) 58.9 kg (129 lb 13.6 oz) 59.3 kg (130 lb 11.7 oz)   Examination: Physical Exam:  Constitutional: Frail elderly AAF who is actively dying in no apparent distress Eyes: Lids normal Neck: No visible JVD Respiratory: Tachypenic with rhonchus appearing breath sounds and scattered crackles Cardiovascular: Irregularly Irregular Abdomen: Soft, NT, ND GU: Deferred Musculoskeletal: No contractures appreciated Skin: Cool and dry. No rashes or lesions Neurologic: Resting comfortably. No focal appreciated deficits Psychiatric: Somnolent. Did not awake and appears to be resting. Impaired judgement and insight  Data Reviewed: I have personally reviewed following labs and imaging studies  CBC:  Recent Labs Lab 01/27/17 0333 01/28/17 0330 01/29/17 0302 01/30/17 0305 01/31/17 0320  WBC 16.4* 17.3* 15.5* 16.6* 14.3*  NEUTROABS 15.2* 16.0* 14.6* 15.4* 13.1*  HGB 9.5* 9.1* 9.2* 9.3* 9.2*  HCT 29.4* 28.5* 28.7* 29.0* 27.6*  MCV 88.3 88.0 88.0 87.6 87.6  PLT 407* 350 338 343 237   Basic Metabolic Panel:  Recent Labs Lab 01/27/17 0333 01/28/17 0330 01/29/17 0302 01/30/17 0305 01/31/17 0320  NA 133* 132* 131* 131* 133*  K 4.9 5.2* 4.8 4.3 4.3  CL 98* 100* 99* 99* 100*  CO2 '28 25 26 26 26  ' GLUCOSE 150* 160* 206* 182* 178*  BUN 27* 31* 26* 19 28*  CREATININE 0.78 0.88 0.81 0.68 0.73  CALCIUM 9.0 8.8* 8.7* 8.7* 8.7*  MG 2.3 2.5* 2.3 2.2 2.3  PHOS 3.2 3.5 3.0 2.9 3.4   GFR: Estimated Creatinine Clearance: 44.4 mL/min (by C-G formula based on SCr of 0.73 mg/dL). Liver Function Tests:  Recent  Labs Lab 01/27/17 0333 01/28/17 0330 01/29/17 0302 01/30/17 0305 01/31/17 0320  AST '24 20 20 21 18  ' ALT 52 41 36 35 28  ALKPHOS 78 80 80 79 63  BILITOT 0.4 0.7 0.6 0.5 0.4  PROT 6.1* 5.6* 5.5* 5.4* 5.2*  ALBUMIN 2.1* 2.1* 2.0* 2.1* 2.0*   No results for input(s): LIPASE, AMYLASE in the last 168 hours. No results for input(s): AMMONIA in the last 168 hours. Coagulation Profile:  Recent Labs  Lab 01/27/17 0333 01/28/17 0330 01/29/17 0302 01/30/17 0305 01/31/17 0320  INR 2.59 3.27 3.65 3.28 2.92   Cardiac Enzymes:  Recent Labs Lab 01/25/17 1930 01/26/17 0253  TROPONINI <0.03 <0.03   BNP (last 3 results) No results for input(s): PROBNP in the last 8760 hours. HbA1C: No results for input(s): HGBA1C in the last 72 hours. CBG:  Recent Labs Lab 01/31/17 1724  GLUCAP 492*   Lipid Profile: No results for input(s): CHOL, HDL, LDLCALC, TRIG, CHOLHDL, LDLDIRECT in the last 72 hours. Thyroid Function Tests: No results for input(s): TSH, T4TOTAL, FREET4, T3FREE, THYROIDAB in the last 72 hours. Anemia Panel: No results for input(s): VITAMINB12, FOLATE, FERRITIN, TIBC, IRON, RETICCTPCT in the last 72 hours. Sepsis Labs: No results for input(s): PROCALCITON, LATICACIDVEN in the last 168 hours.  Recent Results (from the past 240 hour(s))  MRSA PCR Screening     Status: None   Collection Time: 01/23/17  2:24 PM  Result Value Ref Range Status   MRSA by PCR NEGATIVE NEGATIVE Final    Comment:        The GeneXpert MRSA Assay (FDA approved for NASAL specimens only), is one component of a comprehensive MRSA colonization surveillance program. It is not intended to diagnose MRSA infection nor to guide or monitor treatment for MRSA infections.     Radiology Studies: No results found. Scheduled Meds: . chlorhexidine  15 mL Mouth Rinse BID  . mouth rinse  15 mL Mouth Rinse q12n4p  . sodium chloride flush  10-40 mL Intracatheter Q12H   Continuous Infusions:   LOS: 16  days   Kerney Elbe, DO [facility] Pager [phone]  If 7PM-7AM, please contact night-coverage [email or web address] Password [email or web address] 02/01/2017, 11:43 AM

## 2017-02-01 NOTE — Progress Notes (Signed)
Daily Progress Note   Patient Name: Alexandria Chen       Date: 02/01/2017 DOB: [birth year]  Age: 82 y.o. MRN#: [id number] Attending Physician: Kerney Elbe, DO Primary Care Physician: [name], FNP Admit Date: 01/01/2017  Reason for Consultation/Follow-up: Establishing goals of care, Non pain symptom management and Terminal Care  Subjective: Alexandria Chen is actively dying. Oxygen is low and having some changes in breathing pattern.   Length of Stay: 16  Current Medications: Scheduled Meds:  . chlorhexidine  15 mL Mouth Rinse BID  . mouth rinse  15 mL Mouth Rinse q12n4p  . sodium chloride flush  10-40 mL Intracatheter Q12H    Continuous Infusions:   PRN Meds: albuterol, glycopyrrolate, hydrALAZINE, HYDROmorphone (DILAUDID) injection, ketorolac, midazolam, morphine injection, sodium chloride, sodium chloride flush  Physical Exam  Constitutional: She appears well-developed.  HENT:  Head: Normocephalic and atraumatic.  Cardiovascular: Tachycardia present.   Pulmonary/Chest: No accessory muscle usage. No apnea and no tachypnea. No respiratory distress.  Abdominal: Normal appearance.  Neurological: She is unresponsive.  Nursing note and vitals reviewed.           Vital Signs: BP 128/81   Pulse 83   Temp 98.3 F (36.8 C) (Axillary)   Resp 16   Ht 5\' 2"  (1.575 m)   Wt 59.3 kg (130 lb 11.7 oz)   SpO2 (!) 59%   BMI 23.91 kg/m  SpO2: SpO2: (!) 59 % O2 Device: O2 Device: High Flow Nasal Cannula O2 Flow Rate: O2 Flow Rate (L/min): 8 L/min  Intake/output summary:  Intake/Output Summary (Last 24 hours) at 02/01/17 1216 Last data filed at 02/01/17 0900  Gross per 24 hour  Intake               20 ml  Output              100 ml  Net              -80 ml   LBM: Last BM  Date: 01/26/17 Baseline Weight: Weight: 57.2 kg (126 lb) Most recent weight: Weight: 59.3 kg (130 lb 11.7 oz)       Palliative Assessment/Data: 10%    Flowsheet Rows     Most Recent Value  Intake Tab  Referral Department  Hospitalist  Unit at Time  of Referral  Intermediate Care Unit  Palliative Care Primary Diagnosis  Pulmonary  Date Notified  01/28/17  Palliative Care Type  New Palliative care  Reason for referral  Clarify Goals of Care, Psychosocial or Spiritual support  Date of Admission  01/21/2017  Date first seen by Palliative Care  01/29/17  # of days Palliative referral response time  1 Day(s)  # of days IP prior to Palliative referral  14  Clinical Assessment  Palliative Performance Scale Score  40%  Pain Max last 24 hours  Not able to report  Pain Min Last 24 hours  Not able to report  Dyspnea Max Last 24 Hours  Not able to report  Dyspnea Min Last 24 hours  Not able to report  Nausea Max Last 24 Hours  Not able to report  Nausea Min Last 24 Hours  Not able to report  Anxiety Max Last 24 Hours  Not able to report  Anxiety Min Last 24 Hours  Not able to report  Other Max Last 24 Hours  Not able to report  Psychosocial & Spiritual Assessment  Palliative Care Outcomes  Patient/Family meeting held?  Yes  Who was at the meeting?  3 Daughters and son      Patient Active Problem List   Diagnosis Date Noted  . Amiodarone pulmonary toxicity 01/29/2017  . Radiation pneumonitis ([place]) 01/29/2017  . Palliative care by specialist   . Hyponatremia 01/28/2017  . Hyperkalemia 01/28/2017  . Acute respiratory failure ([place])   . Bronchospasm   . Hypokalemia   . Hypoxia 01/28/2017  . HCAP (healthcare-associated pneumonia)   . At high risk for falls 01/06/2017  . Anemia, unspecified 11/28/2016  . Anticoagulated on warfarin 11/28/2016  . Sepsis ([place]) 11/27/2016  . Syncope 11/20/2016  . Atrial fibrillation ([place]) [I48.91] 09/09/2016  . Squamous cell carcinoma of right lung  ([place]) 07/13/2016  . Syncope and collapse 04/14/2015  . Otitis externa of right ear 12/18/2014  . Hearing loss due to cerumen impaction 12/18/2014  . Essential hypertension, benign 01/14/2014  . Hyperlipidemia 01/14/2014  . Vitamin D deficiency 01/14/2014  . Alcohol dependence in remission ([place]) 10/15/2011    Class: Acute  . Depression 08/31/2011  . Anxiety 08/31/2011    Palliative Care Assessment & Plan   HPI: 82 y.o.femalewith past medical history of diastolic heart failure, COPD, hyperlipidemia, atrial fib, depression with anxiety, squamous cell lung cancer status post chemotherapy and radiation treatment (completed in June 2018)ad[facility]to [facility] with shortness of breath. Despite supportive therapy, steroids, antibiotics patient's respiratory status continued to worsen and she was transferred to [facility] for additional critical care medicine/pulmonology support. Repeat chest CT shows markedly worsening interstitial disease;likely postradiation fibrosis/pneumonitis. On the evening of 01/02/17,her respiratory status worsened and she was placed on BiPAP and steroids were increased. Her O2 sats are dropping with any movement. Usual treatment for radiation pneumonitis is usually steroids per pulmonologist's consult,but unfortunately she has not been responding. Per chart review further workup such as biopsy is too risky given patient'sstatus. She has continued to decline over admission and now is at EOL.   Assessment: I spent much time today with family at bedside answering questions and providing support. We discussed natural signs at EOL vs signs of discomfort. Discussed oxygen and how this may be prolonging at this stage but not providing her with any comfort. I recommended to consider decreasing oxygen support. Family continues to struggle with decreasing oxygen but also struggling with her  prolonged stage of dying saying "this is torturous."  Explained that this process is unfortunately normal and expected. We discussed that this is not the fact that she has "more fight" but that we have her comfortable and her body is not expending much energy and therefore making this process slower.    Recommendations/Plan:  Pain/dyspnea: Dilaudid 2 mg every 15 min prn.   Anxiety/agitation: Versed 2 mg every hour prn.   Robinul prn secretions/gurgling.   Toradol prn fever.   Goals of Care and Additional Recommendations:  Limitations on Scope of Treatment: Full Comfort Care  Code Status:  DNR  Prognosis:   Hours - Days  Discharge Planning:  Anticipated Hospital Death  Thank you for allowing the Palliative Medicine Team to assist in the care of this patient.   Total Time 31min Prolonged Time Billed  no       Greater than 50%  of this time was spent counseling and coordinating care related to the above assessment and plan.  Vinie Sill, NP Palliative Medicine Team Pager # [phone] (M-F 8a-5p) Team Phone # [phone] (Nights/Weekends)

## 2017-02-02 DIAGNOSIS — Z515 Encounter for palliative care: Secondary | ICD-10-CM

## 2017-02-02 DIAGNOSIS — Z7189 Other specified counseling: Secondary | ICD-10-CM

## 2017-02-07 ENCOUNTER — Other Ambulatory Visit: Payer: Self-pay | Admitting: Nurse Practitioner

## 2017-02-22 ENCOUNTER — Ambulatory Visit (HOSPITAL_COMMUNITY): Payer: Self-pay | Admitting: Adult Health

## 2017-02-25 ENCOUNTER — Encounter: Payer: Self-pay | Admitting: Pharmacist Clinician (PhC)/ Clinical Pharmacy Specialist

## 2017-03-03 NOTE — Progress Notes (Signed)
O2 decreased to 4l/[place] at this time; again medicated for patient comfort per PRN orders.  Palliative care team at bedside w/family.

## 2017-03-03 NOTE — Progress Notes (Signed)
Responded to page to support family at bedside. Patient has passed. Per family request prayer was made and final blessing offered. Provided empathetic listening, guidance,emotional, spiritual and grief support.  Facilitated information sharing between staff and family.  Chaplain available as needed.   2017/03/04 1548  Clinical Encounter Type  Visited With Patient and family together;Health care provider  Visit Type Initial;Spiritual support;Death  Referral From Nurse  Spiritual Encounters  Spiritual Needs Prayer;Emotional;Grief support  Stress Factors  Family Stress Factors Exhausted;Loss  Cristopher Peru, [facility], Pager [phone]

## 2017-03-03 NOTE — Progress Notes (Signed)
Patient continues to by tachypneic.  Medicating per PRN orders

## 2017-03-03 NOTE — Progress Notes (Signed)
O2 weaned to 2L/HFNC at this time; patient continues to be medicated to maintain comfort in the dying process.  Family remains at bedside.  Questions addressed.

## 2017-03-03 NOTE — Progress Notes (Signed)
Four daughters at bedside; all in agreement to start weaning O2.  O2 decreased to 6l/HFNC at this time.  Will continue to monitor for patient comfort.  Supporting family at this time.

## 2017-03-03 NOTE — Progress Notes (Signed)
Patient is more peaceful; family calm.  Continuing to  Monitor closely and medicating per PRN orders.

## 2017-03-03 NOTE — Death Summary Note (Signed)
DEATH SUMMARY   Patient Details  Name: Alexandria Chen MRN: [id number] DOB: 1934-08-29  Admission/Discharge Information   Admit Date:  11-Feb-2017  Date of Death: Date of Death: 03-02-2017  Time of Death: Time of Death: 25  Length of Stay: 15-Aug-2022  Referring Physician: [name], FNP   Reason(s) for Hospitalization    Diagnoses  Preliminary cause of death:  Secondary Diagnoses (including complications and co-morbidities):  Principal Problem:   Hypoxia Active Problems:   Depression   Essential hypertension, benign   Hyperlipidemia   Atrial fibrillation ([place]) [I48.91]   HCAP (healthcare-associated pneumonia)   Bronchospasm   Acute respiratory failure ([place])   Hyponatremia   Hyperkalemia   Amiodarone pulmonary toxicity   Radiation pneumonitis ([place])   Palliative care by specialist   Goals of care, counseling/discussion   Terminal care   Palliative care encounter   [facility] Course (including significant findings, care, treatment, and services provided and events leading to death)  Alexandria Chen is a 82 y.o. year old female who  Is a 82 year old woman admitted from home on 02/12/23 due being short of breath. She has history of atrial fibrillation, anxiety, depression hypertension and lung cancer recent finishing chemotherapy and radiation. She was recently treated for pneumonia back in August to [facility] on Feb 12, 2023 with difficulty breathing and weakness. Patient was found to be hypoxic and was brought into the hospital for evaluation.She was started on antibiotics as there was concern of healthcare associated pneumonia, also was started on Lasix as there also was concern of acute diastolic dysfunction. Patient did not show signs of improvement and had increasing oxygen requirement up to 100% non-rebreather mask. Patient subsequently was transferred to [facility] given the lack of pulmonary service at [facility] and she was admitted to the SDU.   Patient was  continued on oxygen supplementation and was started on steroids subsequently transferred to [facility] for further management. She remains dyspenic and desaturates on minimal exertion and it is felt that it is related to Amiodarone Toxicity vs. Radiation Pneumonitis and that it may take several weeks for her to clinically improve. Pulmonary ordered a HRCT and looked worse. On the night of 9/27 patient had Dyspnea and was placed on BiPAP then NRB and transitioned to 15 Liters of HFNC. Pulmonary increased Steroids to IV 125 q6h as patient has not progressed and PCCM feels Palliative Care should be consulted to address EOL Care and Goals of Care. Patient continues to Desaturate and Palliative Care had a family meeting yesterday. Overnight the patient did well but 1/3 she decompensated and had to be placed back on BiPAP and was struggling. Palliative care and Dr Alfredia Ferguson met with some of the family members on Sunday AM and later Palliative Care discussed with all the family and patient's children elected to make her DNR with FULL COMFORT MEASURES. She expired on 03-Mar-2023.     Pertinent Labs and Studies  Significant Diagnostic Studies Dg Chest 2 View  Result Date: 01/16/2017 CLINICAL DATA:  Shortness of breath.  History of lung cancer. EXAM: CHEST  2 VIEW COMPARISON:  2017-02-11 FINDINGS: Worsening reticular interstitial accentuation throughout the left lung but slightly favoring the left upper lobe. Continued reticular opacity and hazy density at the right lung apex. Power injectable Port-A-Cath tip: SVC. Mild enlargement of the cardiopericardial silhouette. Atherosclerotic calcification of the aortic arch. Thoracic spondylosis.  No pleural effusion. IMPRESSION: 1. New reticular opacities in both lungs possibly from atypical infectious process or edema.  No definite Kerley B lines to suggest typical cardiogenic edema. 2. Some of the interstitial accentuation in the right upper lobe is thought to be related to prior  radiation therapy. 3.  Aortic Atherosclerosis (ICD10-I70.0). 4. Thoracic spondylosis. Electronically Signed   By: Van Clines M.D.   On: 01/16/2017 13:09   Dg Chest 2 View  Result Date: 01/25/2017 CLINICAL DATA:  Shortness of breath.  Cough and wheezing. EXAM: CHEST  2 VIEW COMPARISON:  CT 2 days prior.  Radiograph 12/29/2016 FINDINGS: Tip of the right chest port in the mid SVC. Post treatment related changes in the right upper lobe. Consolidative opacity anteriorly on CT not well seen radiographically. Persistent right lung volume loss. Mild cardiomegaly with atherosclerosis of the thoracic aorta. Ill-defined left mid and lower lung opacities core spine a ground-glass opacities on prior CT. No pneumothorax or pleural effusion. Stable osseous structures. IMPRESSION: 1. Similar appearance of the chest from recent CT allowing for differences in modality. Post treatment changes in the right upper lobe. Left mid lower lung zone opacities corresponding to ground-glass on CT, may be infectious or inflammatory in etiology. 2. Mild cardiomegaly.  Aortic atherosclerosis. Electronically Signed   By: Jeb Levering M.D.   On: 01/06/2017 04:28   Ct Chest Wo Contrast  Result Date: 01/12/2017 CLINICAL DATA:  Right lung cancer, recent pneumonia, cough and shortness of breath. EXAM: CT CHEST WITHOUT CONTRAST TECHNIQUE: Multidetector CT imaging of the chest was performed following the standard protocol without IV contrast. COMPARISON:  12/14/2016 and PET 07/07/2016. FINDINGS: Cardiovascular: Right IJ Port-A-Cath terminates in the low SVC. Atherosclerotic calcification of the arterial vasculature, including three-vessel involvement of the coronary arteries. Heart is at the upper limits of normal in size to mildly enlarged. No pericardial effusion. Mediastinum/Nodes: Supraclavicular and mediastinal lymph nodes are not enlarged by CT size criteria. Hilar regions are difficult to evaluate without IV contrast. No  axillary adenopathy. Esophagus is grossly unremarkable. Lungs/Pleura: Centrilobular and paraseptal emphysema. Patchy ground-glass is seen in the lungs bilaterally, worst in the right upper lobe, as before. Ground-glass in the left lung has increased slightly, however. Spiculated soft tissue consolidation in the anterior right upper lobe measures 1.9 x 2.7 cm, likely stable. Resolving collapse/ consolidation in the right lower lobe with residual ground-glass and minimal consolidation. No pleural fluid. Airway is unremarkable. Upper Abdomen: Visualized portion of the liver is unremarkable. 9 mm nodule in the medial limb right adrenal gland measures 20 Hounsfield units, stable in size from 12/14/2016 and fluid in density on 07/07/2016. There may be nodular thickening of the left adrenal gland. Visualized portions of the kidneys, spleen, pancreas, stomach and bowel are grossly unremarkable. Musculoskeletal: Degenerative changes in the spine. IMPRESSION: 1. Posttreatment changes in the upper right hemithorax with residual nodular consolidation anteriorly, stable. 2. Resolving collapse/consolidation in the right lower lobe without complete resolution. 3. Multifocal pulmonary parenchymal ground-glass, some of which is new and likely infectious or inflammatory in etiology. 4. Aortic atherosclerosis (ICD10-170.0). Coronary artery calcification. 5.  Emphysema (ICD10-J43.9). 6. Right adrenal adenoma. Electronically Signed   By: Lorin Picket M.D.   On: 01/12/2017 14:51   Ct Chest High Resolution  Result Date: 01/28/2017 CLINICAL DATA:  82 year old female with history of shortness of breath. History of squamous cell carcinoma of the right upper lobe status post chemotherapy and radiation therapy. EXAM: CT CHEST WITHOUT CONTRAST TECHNIQUE: Multidetector CT imaging of the chest was performed following the standard protocol without intravenous contrast. High resolution imaging of the lungs, as well  as inspiratory and  expiratory imaging, was performed. COMPARISON:  Chest CTA 01/12/2017. FINDINGS: Cardiovascular: Heart size is mildly enlarged. There is no significant pericardial fluid, thickening or pericardial calcification. There is aortic atherosclerosis, as well as atherosclerosis of the great vessels of the mediastinum and the coronary arteries, including calcified atherosclerotic plaque in the left main, left anterior descending, left circumflex and right coronary arteries. Right internal jugular single-lumen porta cath with tip terminating at the superior cavoatrial junction. Mild dilatation of the pulmonic trunk (3.6 cm in diameter). Mediastinum/Nodes: No pathologically enlarged mediastinal or hilar lymph nodes. Please note that accurate exclusion of hilar adenopathy is limited on noncontrast CT scans. Moderate-sized hiatal hernia. No axillary lymphadenopathy. Lungs/Pleura: Compared to the prior examination the extensive patchy multifocal asymmetrically distributed interstitial and airspace disease in the lungs bilaterally has significantly increased. Now most regions of both lungs are involved, with exception of relative sparing of the basal segments of the left lower lobe, inferior segment of the lingula, and medial basal segment of the right lower lobe. Treated right upper lobe mass again noted measuring approximately 3.0 x 1.9 cm on today's examination (axial image 38 of series 5). In the lung parenchyma adjacent to the treated mass there are developing areas of traction bronchiectasis and more extensive focal septal thickening and thickening of the peribronchovascular interstitium. No pleural effusions. Inspiratory and expiratory imaging is unremarkable. Mild diffuse bronchial wall thickening with mild centrilobular and paraseptal emphysema. Upper Abdomen: Aortic atherosclerosis. 1.2 cm intermediate attenuation right adrenal nodule is stable compared to prior examinations Musculoskeletal: There are no aggressive  appearing lytic or blastic lesions noted in the visualized portions of the skeleton. IMPRESSION: 1. Marked worsening patchy multifocal interstitial and airspace disease asymmetrically distributed throughout the lungs bilaterally. Given the patient's history of recent radiation therapy for treatment of known right upper lobe mass (which appears roughly stable in size compared to the prior examination), much of these findings likely reflect postradiation changes of severe radiation pneumonitis and developing areas of radiation fibrosis. The possibility of superimposed multilobar pneumonia is not entirely excluded, but is not strongly favored. 2. Dilatation of the pulmonic trunk (3.6 cm in diameter), concerning for developing pulmonary arterial hypertension. 3. Mild diffuse bronchial wall thickening with mild centrilobular and paraseptal emphysema; imaging findings suggestive of underlying COPD. 4. Aortic atherosclerosis, in addition to left main and 3 vessel coronary artery disease. 5. Moderate-sized hiatal hernia. 6. Additional incidental findings, as above. Aortic Atherosclerosis (ICD10-I70.0) and Emphysema (ICD10-J43.9). Electronically Signed   By: Vinnie Langton M.D.   On: 01/28/2017 08:42   Dg Chest Port 1 View  Result Date: 01/25/2017 CLINICAL DATA:  Shortness of breath. History of right lung cancer. History of hypertension and atrial fibrillation. EXAM: PORTABLE CHEST 1 VIEW COMPARISON:  01/23/2017. FINDINGS: PowerPort catheter with lead tip over the superior vena cava in stable position. Stable cardiomegaly. Stable right lung mass. Persistent but improved bilateral pulmonary interstitial infiltrates and or edema. A component changes may be related radiation treatment. No pleural effusion or pneumothorax. IMPRESSION: 1. PowerPort catheter in stable position. 2.  Stable right lung mass. 3. Stable cardiomegaly. Persistent but improved bilateral pulmonary interstitial infiltrates and or edema . A component  these changes may be related radiation treatment. Electronically Signed   By: Marcello Moores  Register   On: 01/25/2017 07:40   Dg Chest Port 1 View  Result Date: 01/23/2017 CLINICAL DATA:  Hypoxia EXAM: PORTABLE CHEST 1 VIEW COMPARISON:  01/16/2017 FINDINGS: Cardiac shadow is stable. Right chest wall port is  again seen. Diffuse interstitial changes are noted throughout both lungs new from the prior exam likely representing a component of pulmonary edema. No sizable effusion or infiltrate is seen. IMPRESSION: Increasing interstitial changes likely related to pulmonary edema. Electronically Signed   By: Inez Catalina M.D.   On: 01/23/2017 11:53   Dg Abd Portable 1v  Result Date: 01/28/2017 CLINICAL DATA:  Abdominal distension EXAM: PORTABLE ABDOMEN - 1 VIEW COMPARISON:  12/14/2016 FINDINGS: Scattered large and small bowel gas is noted. No obstructive changes are seen. Calcified uterine fibroids are noted. Mild retained fecal material is noted in the right colon. No free air is seen. No acute bony abnormality is noted. IMPRESSION: No acute abnormality noted. Electronically Signed   By: Inez Catalina M.D.   On: 01/28/2017 10:22    Microbiology No results found for this or any previous visit (from the past 240 hour(s)).  Lab Basic Metabolic Panel:  Recent Labs Lab 01/28/17 0330 01/29/17 0302 01/30/17 0305 01/31/17 0320  NA 132* 131* 131* 133*  K 5.2* 4.8 4.3 4.3  CL 100* 99* 99* 100*  CO2 '25 26 26 26  ' GLUCOSE 160* 206* 182* 178*  BUN 31* 26* 19 28*  CREATININE 0.88 0.81 0.68 0.73  CALCIUM 8.8* 8.7* 8.7* 8.7*  MG 2.5* 2.3 2.2 2.3  PHOS 3.5 3.0 2.9 3.4   Liver Function Tests:  Recent Labs Lab 01/28/17 0330 01/29/17 0302 01/30/17 0305 01/31/17 0320  AST '20 20 21 18  ' ALT 41 36 35 28  ALKPHOS 80 80 79 63  BILITOT 0.7 0.6 0.5 0.4  PROT 5.6* 5.5* 5.4* 5.2*  ALBUMIN 2.1* 2.0* 2.1* 2.0*   No results for input(s): LIPASE, AMYLASE in the last 168 hours. No results for input(s): AMMONIA in  the last 168 hours. CBC:  Recent Labs Lab 01/28/17 0330 01/29/17 0302 01/30/17 0305 01/31/17 0320  WBC 17.3* 15.5* 16.6* 14.3*  NEUTROABS 16.0* 14.6* 15.4* 13.1*  HGB 9.1* 9.2* 9.3* 9.2*  HCT 28.5* 28.7* 29.0* 27.6*  MCV 88.0 88.0 87.6 87.6  PLT 350 338 343 305   Cardiac Enzymes: No results for input(s): CKTOTAL, CKMB, CKMBINDEX, TROPONINI in the last 168 hours. Sepsis Labs:  Recent Labs Lab 01/28/17 0330 01/29/17 0302 01/30/17 0305 01/31/17 0320  WBC 17.3* 15.5* 16.6* 14.3*        Lakindra Wible 02/03/2017, 5:11 PM

## 2017-03-03 NOTE — Progress Notes (Signed)
[facility]  I have taken over care as attending today. Respiratory failure with ILD, Sq cell carcinoma of lungs with radiation pneumonitis vs Amiodarone toxicity, not responsive to steroids.   Patient evaluated. She is asleep and appears comfortable. Family at bedside feel that she is being well taken care of.  She is comfort care and plan for [facility] place tomorrow. Appreciate palliative care management.   Debbe Odea, MD

## 2017-03-03 NOTE — Progress Notes (Signed)
Patient tachypneic; continue to medicate per PRN orders for comfort in the dying process.

## 2017-03-03 NOTE — Progress Notes (Signed)
Chaplain stopped in to visit family who is at bedside of their loved one.  Family requested prayer.  Chaplain and family prayed together as they continue to sit with their loved one who is actively passing.  Chaplain will continue to provide support for this patient and family.    Feb 24, 2017 1341  Clinical Encounter Type  Visited With Patient and family together  Visit Type Follow-up;Psychological support;Spiritual support;Social support;Patient actively dying  Referral From Chaplain  Consult/Referral To [place]

## 2017-03-03 NOTE — Progress Notes (Signed)
Daily Progress Note   Patient Name: Alexandria Chen       Date: 02/04/17 DOB: [birth year]  Age: 82 y.o. MRN#: [id number] Attending Physician: Debbe Odea, MD Primary Care Physician: [name], FNP Admit Date: 01/01/2017  Reason for Consultation/Follow-up: Establishing goals of care, Non pain symptom management and Terminal Care  Subjective: Ms. Cowher continues to be actively dying.    Length of Stay: 17  Current Medications: Scheduled Meds:  . chlorhexidine  15 mL Mouth Rinse BID  . mouth rinse  15 mL Mouth Rinse q12n4p  . sodium chloride flush  10-40 mL Intracatheter Q12H    Continuous Infusions:   PRN Meds: albuterol, glycopyrrolate, hydrALAZINE, HYDROmorphone (DILAUDID) injection, ketorolac, midazolam, sodium chloride, sodium chloride flush  Physical Exam  Constitutional: She appears well-developed.  HENT:  Head: Normocephalic and atraumatic.  Cardiovascular: Tachycardia present.   Pulmonary/Chest: No accessory muscle usage. No apnea and no tachypnea. No respiratory distress.  Abdominal: Normal appearance.  Neurological: She is unresponsive.  Nursing note and vitals reviewed.           Vital Signs: BP 106/70 (BP Location: Left Arm)   Pulse (!) 51   Temp 98.3 F (36.8 C) (Axillary)   Resp 20   Ht 5\' 2"  (1.575 m)   Wt 59.3 kg (130 lb 11.7 oz)   SpO2 (!) 87%   BMI 23.91 kg/m  SpO2: SpO2: (!) 87 % O2 Device: O2 Device: High Flow Nasal Cannula O2 Flow Rate: O2 Flow Rate (L/min): 8 L/min  Intake/output summary:   Intake/Output Summary (Last 24 hours) at Feb 04, 2017 1107 Last data filed at 2017/02/04 1055  Gross per 24 hour  Intake               20 ml  Output              700 ml  Net             -680 ml   LBM: Last BM Date: 01/26/17 Baseline Weight: Weight:  57.2 kg (126 lb) Most recent weight: Weight: 59.3 kg (130 lb 11.7 oz)       Palliative Assessment/Data: 10%    Flowsheet Rows     Most Recent Value  Intake Tab  Referral Department  Hospitalist  Unit at Time of Referral  Intermediate Care  Unit  Palliative Care Primary Diagnosis  Pulmonary  Date Notified  01/28/17  Palliative Care Type  New Palliative care  Reason for referral  Clarify Goals of Care, Psychosocial or Spiritual support  Date of Admission  01/06/2017  Date first seen by Palliative Care  01/29/17  # of days Palliative referral response time  1 Day(s)  # of days IP prior to Palliative referral  14  Clinical Assessment  Palliative Performance Scale Score  40%  Pain Max last 24 hours  Not able to report  Pain Min Last 24 hours  Not able to report  Dyspnea Max Last 24 Hours  Not able to report  Dyspnea Min Last 24 hours  Not able to report  Nausea Max Last 24 Hours  Not able to report  Nausea Min Last 24 Hours  Not able to report  Anxiety Max Last 24 Hours  Not able to report  Anxiety Min Last 24 Hours  Not able to report  Other Max Last 24 Hours  Not able to report  Psychosocial & Spiritual Assessment  Palliative Care Outcomes  Patient/Family meeting held?  Yes  Who was at the meeting?  3 Daughters and son      Patient Active Problem List   Diagnosis Date Noted  . Goals of care, counseling/discussion   . Amiodarone pulmonary toxicity 01/29/2017  . Radiation pneumonitis ([place]) 01/29/2017  . Palliative care by specialist   . Hyponatremia 01/28/2017  . Hyperkalemia 01/28/2017  . Acute respiratory failure ([place])   . Bronchospasm   . Hypokalemia   . Hypoxia 01/09/2017  . HCAP (healthcare-associated pneumonia)   . At high risk for falls 01/06/2017  . Anemia, unspecified 11/28/2016  . Anticoagulated on warfarin 11/28/2016  . Sepsis ([place]) 11/27/2016  . Syncope 11/20/2016  . Atrial fibrillation ([place]) [I48.91] 09/09/2016  . Squamous cell carcinoma of right lung  ([place]) 07/13/2016  . Syncope and collapse 04/14/2015  . Otitis externa of right ear 12/18/2014  . Hearing loss due to cerumen impaction 12/18/2014  . Essential hypertension, benign 01/14/2014  . Hyperlipidemia 01/14/2014  . Vitamin D deficiency 01/14/2014  . Alcohol dependence in remission ([place]) 10/15/2011    Class: Acute  . Depression 08/31/2011  . Anxiety 08/31/2011    Palliative Care Assessment & Plan   HPI: 82 y.o.femalewith past medical history of diastolic heart failure, COPD, hyperlipidemia, atrial fib, depression with anxiety, squamous cell lung cancer status post chemotherapy and radiation treatment (completed in June 2018)admitted [facility]to [facility] with shortness of breath. Despite supportive therapy, steroids, antibiotics patient's respiratory status continued to worsen and she was transferred to [facility] for additional critical care medicine/pulmonology support. Repeat chest CT shows markedly worsening interstitial disease;likely postradiation fibrosis/pneumonitis. On the evening of 01/02/17,her respiratory status worsened and she was placed on BiPAP and steroids were increased. Her O2 sats are dropping with any movement. Usual treatment for radiation pneumonitis is usually steroids per pulmonologist's consult,but unfortunately she has not been responding. Per chart review further workup such as biopsy is too risky given patient'sstatus. She has continued to decline over admission and now is at EOL.   Assessment: Ms. Maskell continues to be actively dying. RN has spoken more with family this morning and they now do agree to titrate down oxygen. Family is exhausted. Some have had some questions and struggles with if this is her trying to fight and we clearly stated no that her body continues to shut down. [name], RN has been great  reinforcement today to ease family minds and answer questions. All questions/concerns addressed and emotional support  provided.   Recommendations/Plan:  Pain/dyspnea: Dilaudid 2 mg every 15 min prn.   Anxiety/agitation: Versed 2 mg every hour prn.   Robinul prn secretions/gurgling.   Toradol prn fever.   Goals of Care and Additional Recommendations:  Limitations on Scope of Treatment: Full Comfort Care  Code Status:  DNR  Prognosis:   Hours - Days  Discharge Planning:  Anticipated Hospital Death  Thank you for allowing the Palliative Medicine Team to assist in the care of this patient.   Total Time 25 min Prolonged Time Billed  no       Greater than 50%  of this time was spent counseling and coordinating care related to the above assessment and plan.  Vinie Sill, NP Palliative Medicine Team Pager # [phone] (M-F 8a-5p) Team Phone # [phone] (Nights/Weekends)

## 2017-03-03 NOTE — Progress Notes (Signed)
Patient w/RR high 30s and P160s.  Patient medicated per PRN orders.  Two daughters and other family at bedside.  Long discussion re: patient's comfort and continued treatments.  O2 remains at 8L/HFNC.  This RN explains decreasing oxygen support as this may be prolonging the patient's dying process.  Family feels this whole process is tortuous for them and do not want patient to suffer.  RN explains goal is to keep patient comfortable through the dying process.

## 2017-03-03 DEATH — deceased

## 2017-03-08 ENCOUNTER — Ambulatory Visit: Payer: Medicare Other | Admitting: *Deleted

## 2017-11-04 IMAGING — CT CT CHEST W/O CM
3 of 4 series · 16 of 30 positions shown, 18 images · non-contrast
Comparison: 12/14/2016 and PET 07/07/2016.

CLINICAL DATA: Right lung cancer, recent pneumonia, cough and
shortness of breath.

EXAM:
CT CHEST WITHOUT CONTRAST
TECHNIQUE: Multidetector CT imaging of the chest was performed following the
standard protocol without IV contrast.

[Series 3: chest w/o · axial · non-contrast · 0.59mm/px · z∈[-184,+11]mm · 7 of 106 slices shown, 9 images]
[im 14/106  mediastinal]
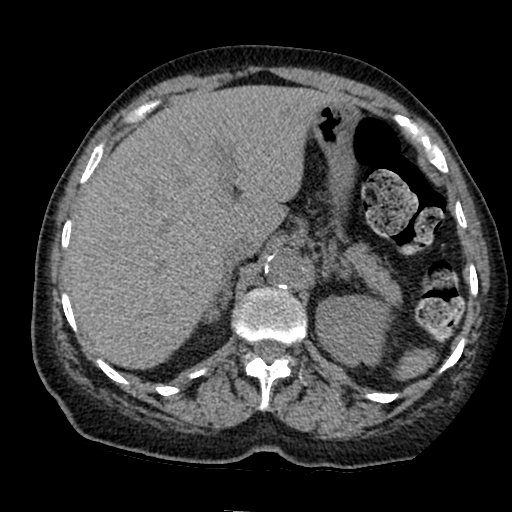
[im 14/106  lung]
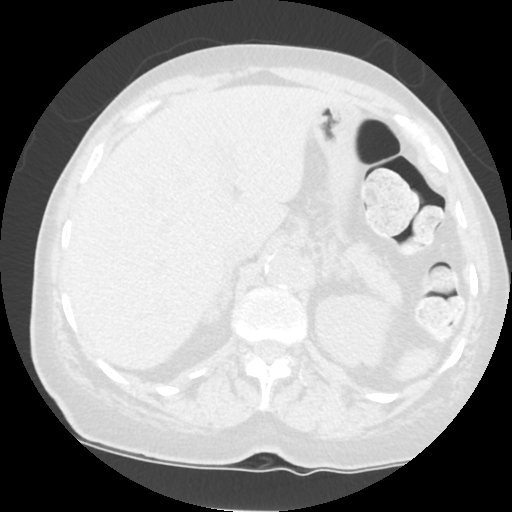
[im 27/106  lung]
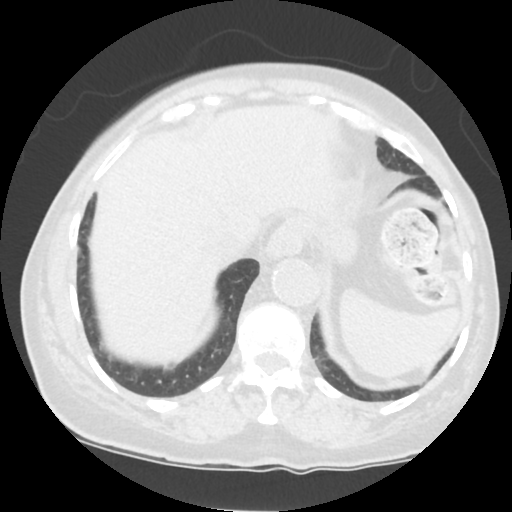
[im 40/106  lung]
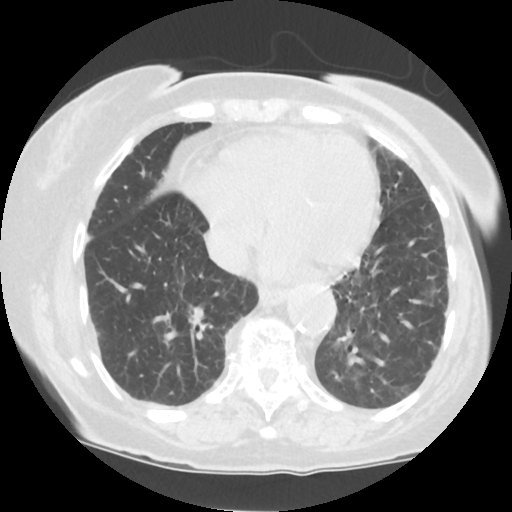
[im 53/106  lung]
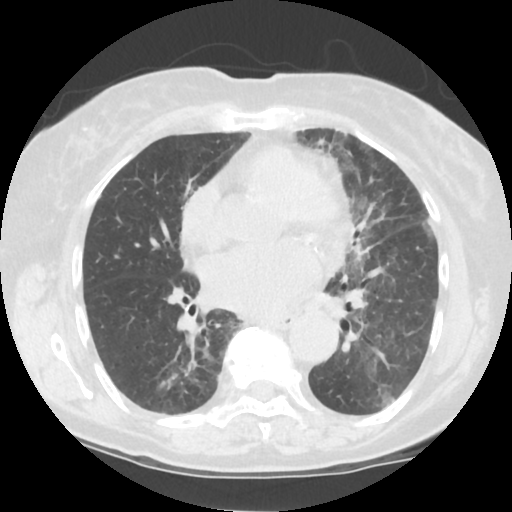
[im 66/106  mediastinal]
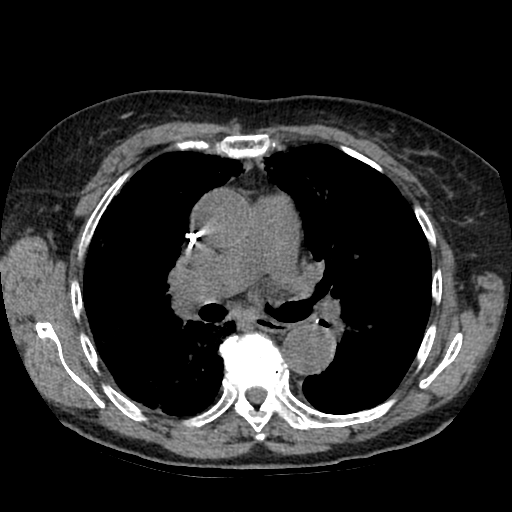
[im 66/106  lung]
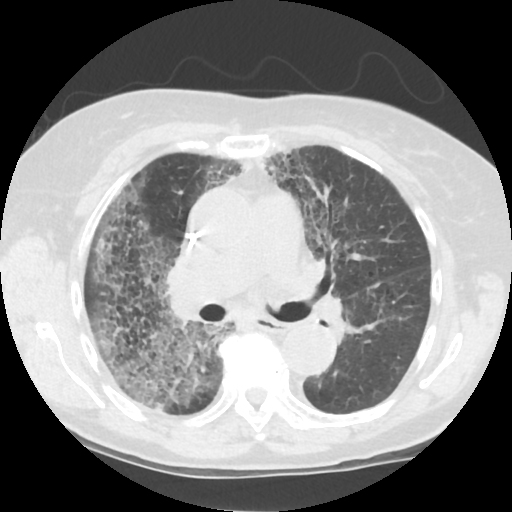
[im 79/106  lung]
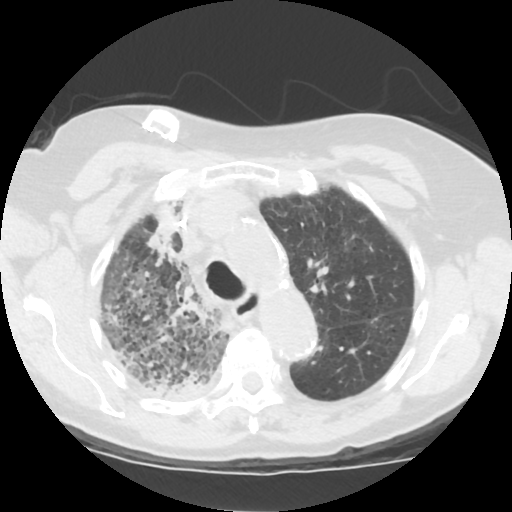
[im 92/106  lung]
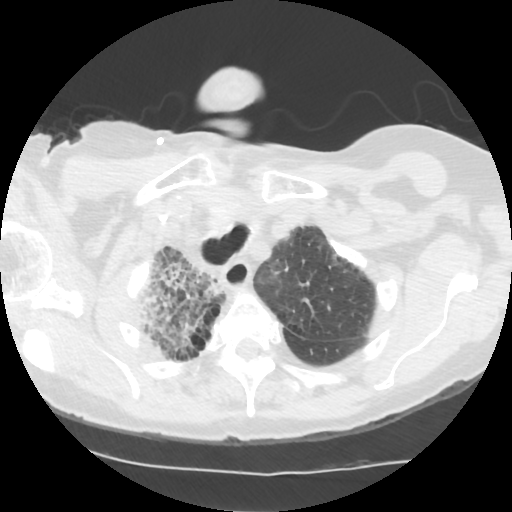

[Series 4: lung windows · axial · 0.59mm/px · z∈[-184,+11]mm · 7 of 106 slices shown]
[im 14/106  lung]
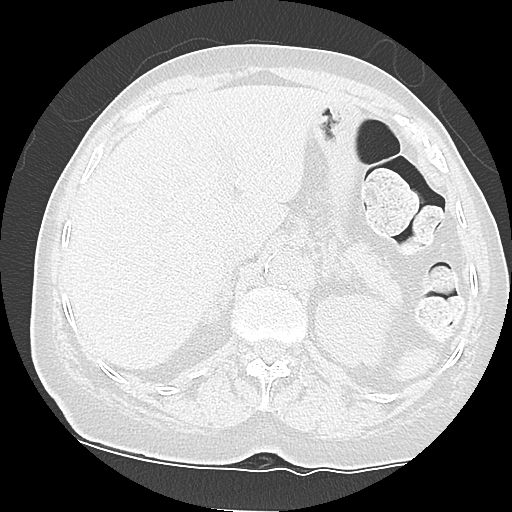
[im 27/106  lung]
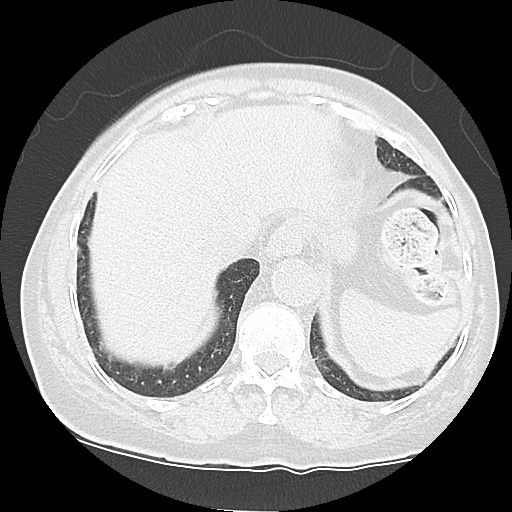
[im 40/106  lung]
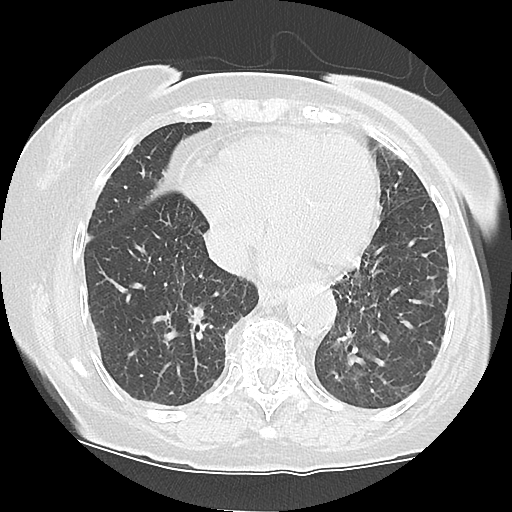
[im 53/106  lung]
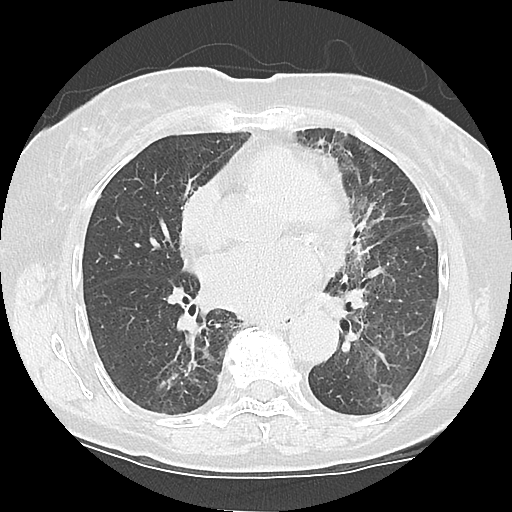
[im 66/106  lung]
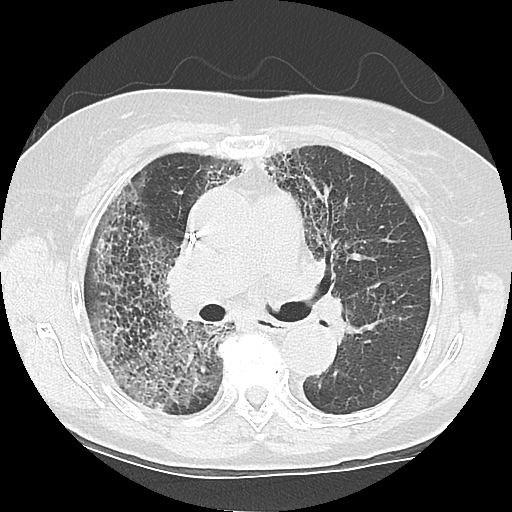
[im 79/106  lung]
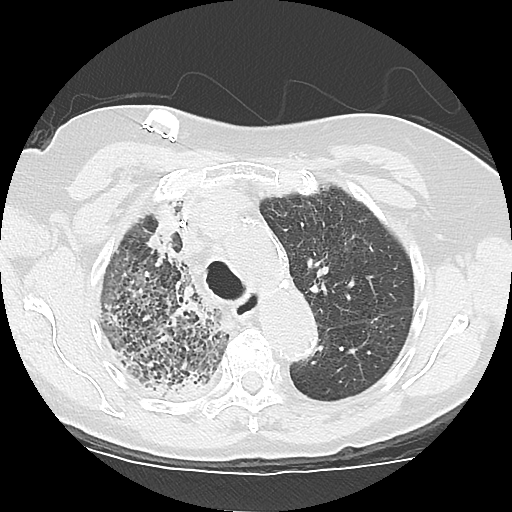
[im 92/106  lung]
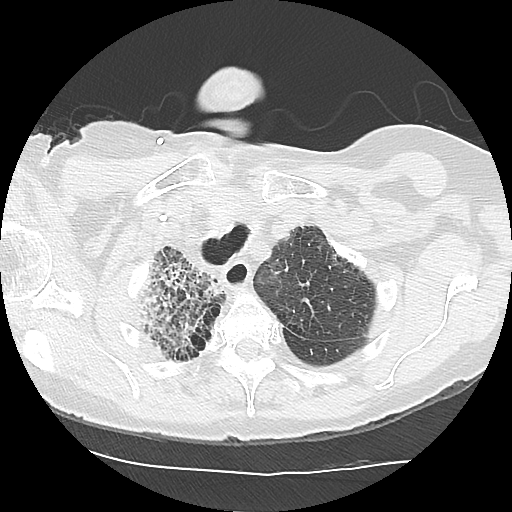

[Series 602: sagittal body · sagittal · 0.59mm/px · 2 of 122 slices shown]
[im 14/122  mediastinal]
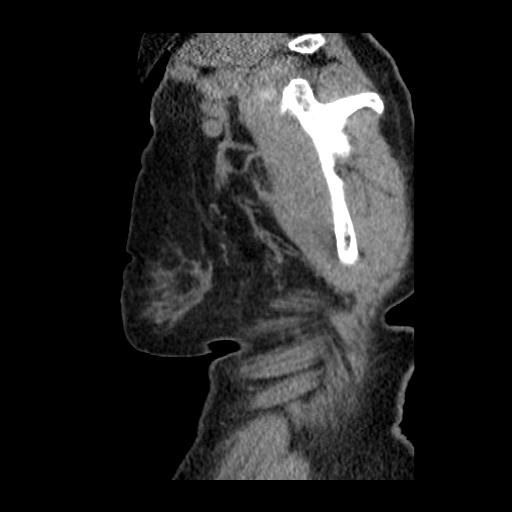
[im 27/122  mediastinal]
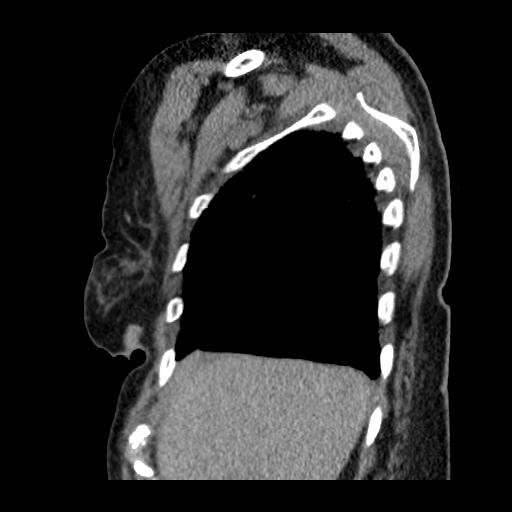

[16 of 30 positions shown; findings below may reference images not displayed]

FINDINGS: Cardiovascular: Right IJ Port-A-Cath terminates in the low SVC.
Atherosclerotic calcification of the arterial vasculature, including
three-vessel involvement of the coronary arteries. Heart is at the
upper limits of normal in size to mildly enlarged. No pericardial
effusion.

Mediastinum/Nodes: Supraclavicular and mediastinal lymph nodes are
not enlarged by CT size criteria. Hilar regions are difficult to
evaluate without IV contrast. No axillary adenopathy. Esophagus is
grossly unremarkable.

Lungs/Pleura: Centrilobular and paraseptal emphysema. Patchy
ground-glass is seen in the lungs bilaterally, worst in the right
upper lobe, as before. Ground-glass in the left lung has increased
slightly, however. Spiculated soft tissue consolidation in the
anterior right upper lobe measures 1.9 x 2.7 cm, likely stable.
Resolving collapse/ consolidation in the right lower lobe with
residual ground-glass and minimal consolidation. No pleural fluid.
Airway is unremarkable.

Upper Abdomen: Visualized portion of the liver is unremarkable. 9 mm
nodule in the medial limb right adrenal gland measures 20 Hounsfield
units, stable in size from 12/14/2016 and fluid in density on
07/07/2016. There may be nodular thickening of the left adrenal
gland. Visualized portions of the kidneys, spleen, pancreas, stomach
and bowel are grossly unremarkable.

Musculoskeletal: Degenerative changes in the spine.
IMPRESSION: 1. Posttreatment changes in the upper right hemithorax with residual
nodular consolidation anteriorly, stable.
2. Resolving collapse/consolidation in the right lower lobe without
complete resolution.
3. Multifocal pulmonary parenchymal ground-glass, some of which is
new and likely infectious or inflammatory in etiology.
4. Aortic atherosclerosis (KCHE6-170.0). Coronary artery
calcification.
5.  Emphysema ([id number].Q).
6. Right adrenal adenoma.

## 2018-09-16 IMAGING — US US CAROTID DUPLEX BILAT
1 series · 13 of 24 positions shown · non-contrast
Comparison: 06/05/2009

CLINICAL DATA: Syncope.  Vertigo.  History of lung cancer.

EXAM:
BILATERAL CAROTID DUPLEX ULTRASOUND
TECHNIQUE: Gray scale imaging, color Doppler and duplex ultrasound were
performed of bilateral carotid and vertebral arteries in the neck.

[Series 1: us carotid duplex bilat · 0.06mm/px · 13 of 68 slices shown]
[im 1/68]
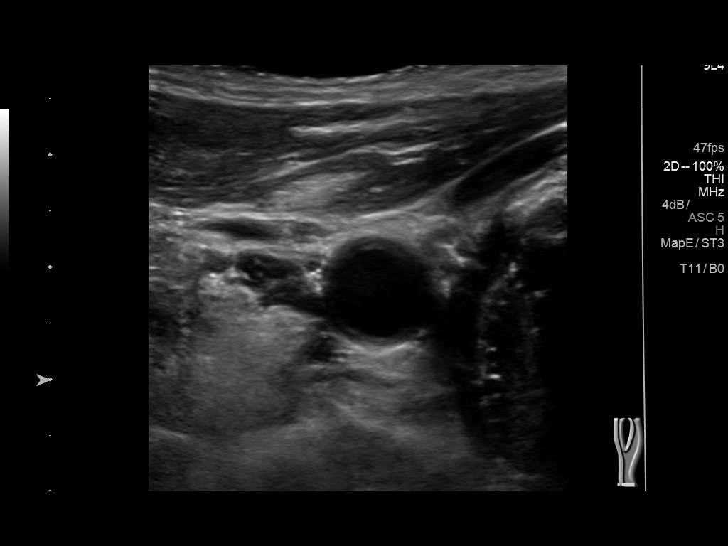
[im 6/68]
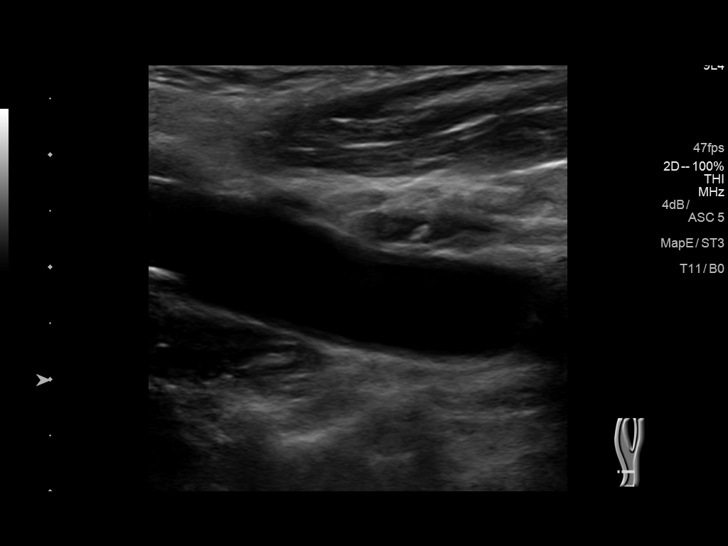
[im 12/68]
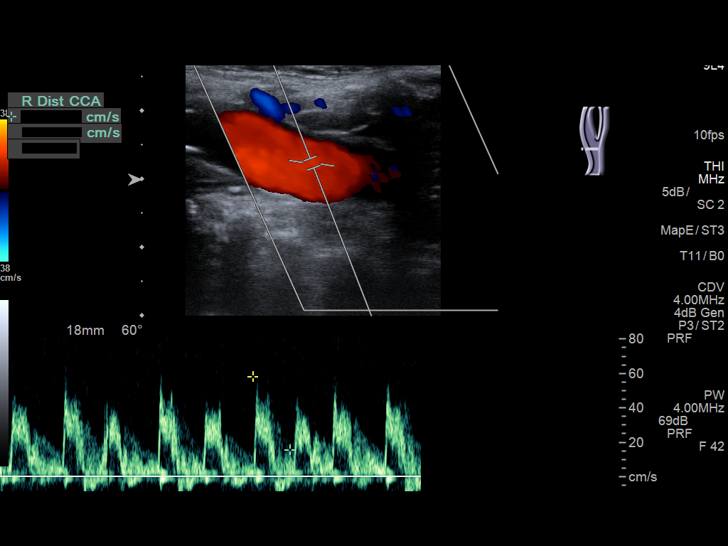
[im 18/68]
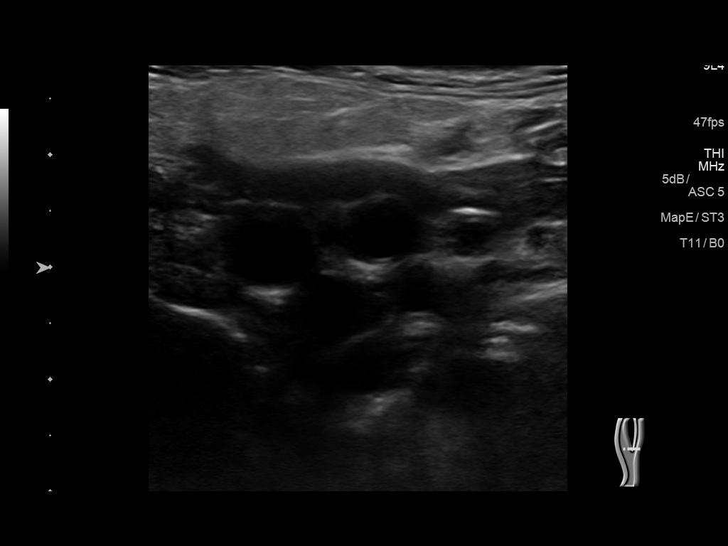
[im 24/68]
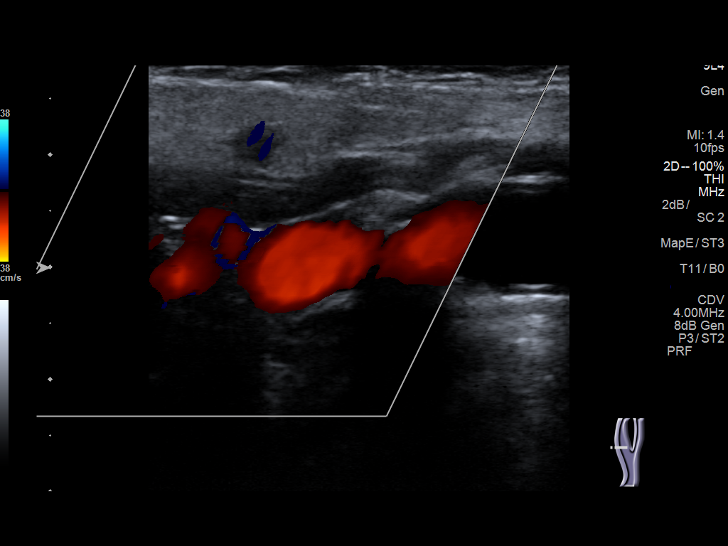
[im 30/68]
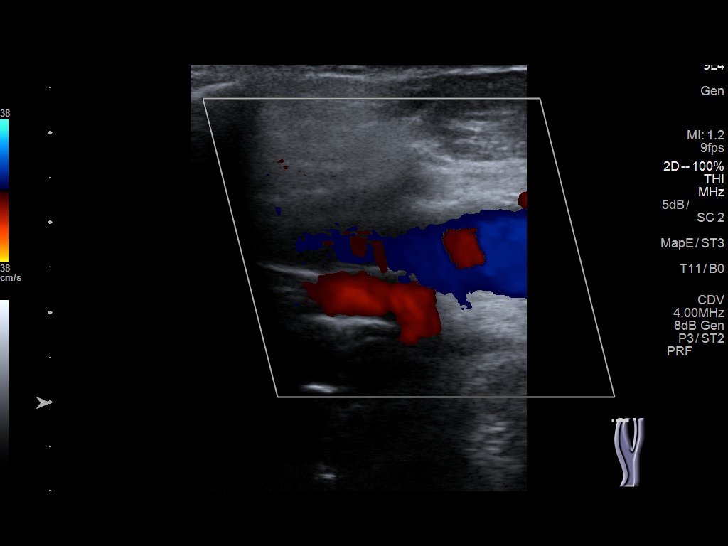
[im 35/68]
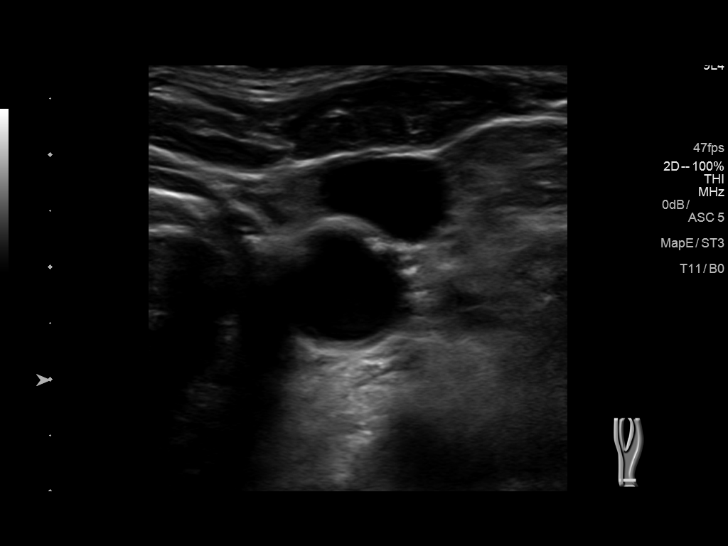
[im 38/68]
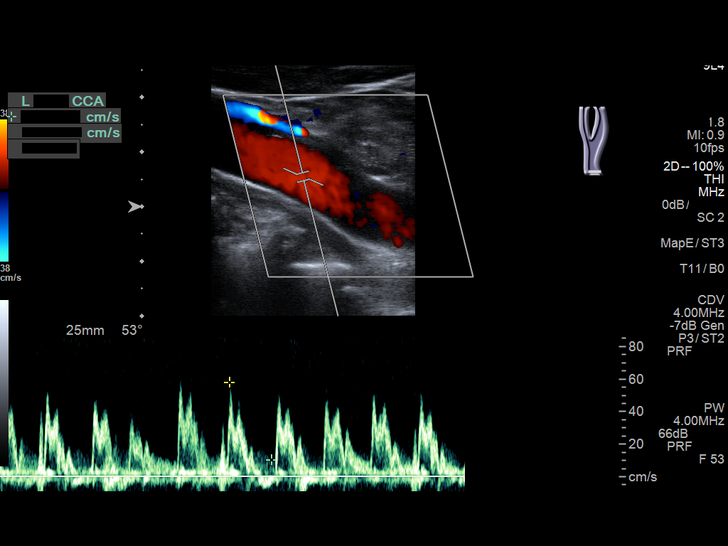
[im 44/68]
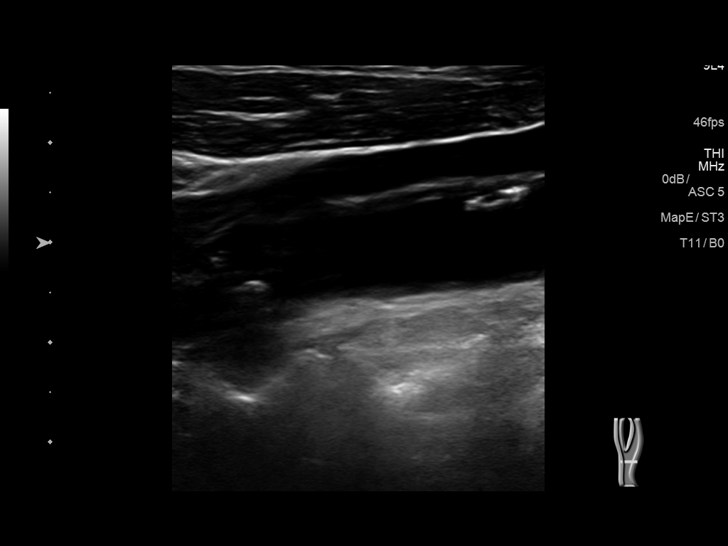
[im 50/68]
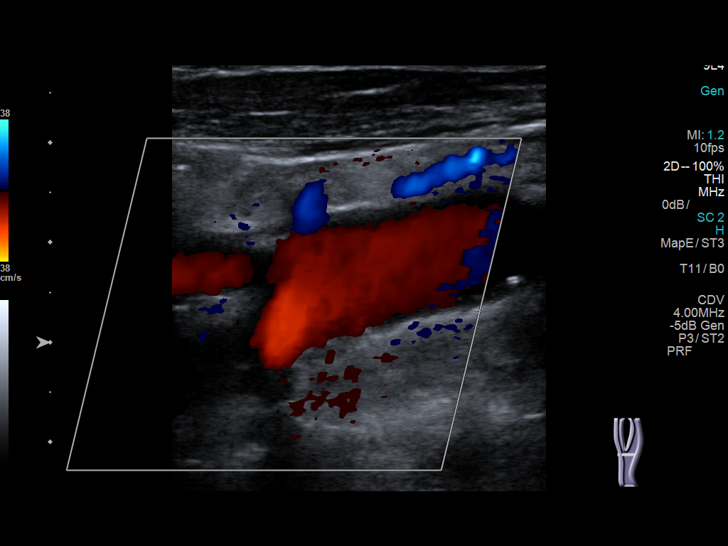
[im 56/68]
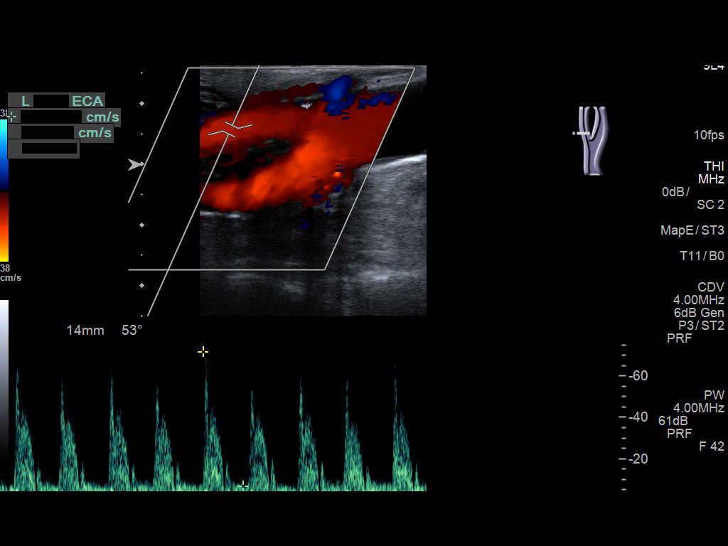
[im 62/68]
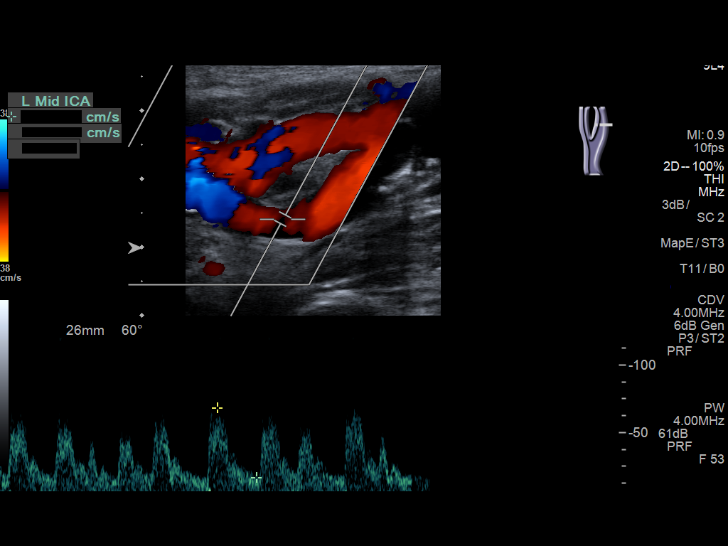
[im 68/68]
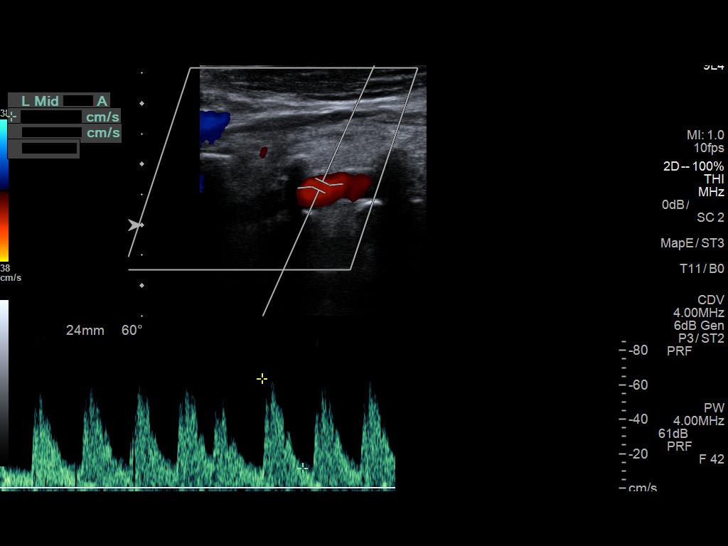

[13 of 24 positions shown; findings below may reference images not displayed]

FINDINGS: Criteria: Quantification of carotid stenosis is based on velocity
parameters that correlate the residual internal carotid diameter
with NASCET-based stenosis levels, using the diameter of the distal
internal carotid lumen as the denominator for stenosis measurement.

The following velocity measurements were obtained:

RIGHT

ICA:  113 cm/sec

CCA:  59 cm/sec

SYSTOLIC ICA/CCA RATIO:

DIASTOLIC ICA/CCA RATIO:

ECA:  64 cm/sec

LEFT

ICA:  82 cm/sec

CCA:  56 cm/sec

SYSTOLIC ICA/CCA RATIO:

DIASTOLIC ICA/CCA RATIO:

ECA:  72 cm/sec

RIGHT CAROTID ARTERY: There is prominent calcified plaque in the
bulb. Low resistance internal carotid Doppler pattern is preserved.

RIGHT VERTEBRAL ARTERY:  Antegrade.

LEFT CAROTID ARTERY: Moderate irregular plaque with some
calcification in the bulb. Low resistance internal carotid Doppler
pattern.

LEFT VERTEBRAL ARTERY:  Antegrade.
IMPRESSION: There is less than 50% stenosis in the right and left internal
carotid arteries. There is plaque in both bulbs right greater than
left.
